# Patient Record
Sex: Female | Born: 1935 | Race: White | Hispanic: No | Smoking: Former smoker
Health system: Southern US, Community
[De-identification: ages and names within clinical notes are randomized; demographics above are authoritative.]

## PROBLEM LIST (undated history)

## (undated) DIAGNOSIS — K449 Diaphragmatic hernia without obstruction or gangrene: Secondary | ICD-10-CM

## (undated) DIAGNOSIS — I480 Paroxysmal atrial fibrillation: Secondary | ICD-10-CM

## (undated) DIAGNOSIS — E079 Disorder of thyroid, unspecified: Secondary | ICD-10-CM

## (undated) DIAGNOSIS — I441 Atrioventricular block, second degree: Secondary | ICD-10-CM

## (undated) DIAGNOSIS — F329 Major depressive disorder, single episode, unspecified: Secondary | ICD-10-CM

## (undated) DIAGNOSIS — E785 Hyperlipidemia, unspecified: Secondary | ICD-10-CM

## (undated) DIAGNOSIS — Z9049 Acquired absence of other specified parts of digestive tract: Secondary | ICD-10-CM

## (undated) DIAGNOSIS — K2970 Gastritis, unspecified, without bleeding: Secondary | ICD-10-CM

## (undated) DIAGNOSIS — M1990 Unspecified osteoarthritis, unspecified site: Secondary | ICD-10-CM

## (undated) DIAGNOSIS — E039 Hypothyroidism, unspecified: Secondary | ICD-10-CM

## (undated) DIAGNOSIS — Z9289 Personal history of other medical treatment: Secondary | ICD-10-CM

## (undated) DIAGNOSIS — N189 Chronic kidney disease, unspecified: Secondary | ICD-10-CM

## (undated) DIAGNOSIS — I2510 Atherosclerotic heart disease of native coronary artery without angina pectoris: Secondary | ICD-10-CM

## (undated) DIAGNOSIS — J209 Acute bronchitis, unspecified: Secondary | ICD-10-CM

## (undated) DIAGNOSIS — E876 Hypokalemia: Secondary | ICD-10-CM

## (undated) DIAGNOSIS — I10 Essential (primary) hypertension: Secondary | ICD-10-CM

## (undated) DIAGNOSIS — K8020 Calculus of gallbladder without cholecystitis without obstruction: Secondary | ICD-10-CM

## (undated) DIAGNOSIS — K219 Gastro-esophageal reflux disease without esophagitis: Secondary | ICD-10-CM

## (undated) DIAGNOSIS — F32A Depression, unspecified: Secondary | ICD-10-CM

## (undated) DIAGNOSIS — S72009A Fracture of unspecified part of neck of unspecified femur, initial encounter for closed fracture: Secondary | ICD-10-CM

## (undated) HISTORY — DX: Major depressive disorder, single episode, unspecified: F32.9

## (undated) HISTORY — DX: Hyperlipidemia, unspecified: E78.5

## (undated) HISTORY — DX: Hypokalemia: E87.6

## (undated) HISTORY — PX: BLEPHAROPLASTY: SUR158

## (undated) HISTORY — PX: CARDIAC CATHETERIZATION: SHX172

## (undated) HISTORY — DX: Depression, unspecified: F32.A

## (undated) HISTORY — DX: Personal history of other medical treatment: Z92.89

## (undated) HISTORY — DX: Atrioventricular block, second degree: I44.1

## (undated) HISTORY — DX: Diaphragmatic hernia without obstruction or gangrene: K44.9

## (undated) HISTORY — PX: CATARACT EXTRACTION: SUR2

## (undated) HISTORY — PX: ANGIOPLASTY: SHX39

## (undated) HISTORY — DX: Paroxysmal atrial fibrillation: I48.0

## (undated) HISTORY — DX: Disorder of thyroid, unspecified: E07.9

## (undated) HISTORY — DX: Acquired absence of other specified parts of digestive tract: Z90.49

## (undated) HISTORY — PX: DILATION AND CURETTAGE OF UTERUS: SHX78

## (undated) HISTORY — DX: Fracture of unspecified part of neck of unspecified femur, initial encounter for closed fracture: S72.009A

## (undated) HISTORY — PX: BACK SURGERY: SHX140

## (undated) HISTORY — PX: BREAST LUMPECTOMY: SHX2

## (undated) HISTORY — DX: Gastritis, unspecified, without bleeding: K29.70

## (undated) HISTORY — DX: Atherosclerotic heart disease of native coronary artery without angina pectoris: I25.10

## (undated) HISTORY — DX: Essential (primary) hypertension: I10

## (undated) HISTORY — DX: Unspecified osteoarthritis, unspecified site: M19.90

## (undated) HISTORY — PX: CHOLECYSTECTOMY: SHX55

## (undated) HISTORY — DX: Calculus of gallbladder without cholecystitis without obstruction: K80.20

## (undated) HISTORY — DX: Chronic kidney disease, unspecified: N18.9

---

## 2004-12-27 HISTORY — PX: CARDIAC CATHETERIZATION: SHX172

## 2004-12-27 HISTORY — PX: ANGIOPLASTY: SHX39

## 2015-04-04 ENCOUNTER — Ambulatory Visit (INDEPENDENT_AMBULATORY_CARE_PROVIDER_SITE_OTHER): Payer: Medicare PPO | Admitting: Cardiology

## 2015-04-04 ENCOUNTER — Encounter: Payer: Self-pay | Admitting: Cardiology

## 2015-04-04 VITALS — BP 118/62 | HR 59 | Ht 63.0 in | Wt 149.0 lb

## 2015-04-04 DIAGNOSIS — Z87891 Personal history of nicotine dependence: Secondary | ICD-10-CM | POA: Diagnosis not present

## 2015-04-04 DIAGNOSIS — I2583 Coronary atherosclerosis due to lipid rich plaque: Principal | ICD-10-CM

## 2015-04-04 DIAGNOSIS — I10 Essential (primary) hypertension: Secondary | ICD-10-CM | POA: Diagnosis not present

## 2015-04-04 DIAGNOSIS — I2510 Atherosclerotic heart disease of native coronary artery without angina pectoris: Secondary | ICD-10-CM | POA: Diagnosis not present

## 2015-04-04 DIAGNOSIS — Z7901 Long term (current) use of anticoagulants: Secondary | ICD-10-CM | POA: Insufficient documentation

## 2015-04-04 DIAGNOSIS — E785 Hyperlipidemia, unspecified: Secondary | ICD-10-CM | POA: Diagnosis not present

## 2015-04-04 NOTE — Patient Instructions (Signed)
Please stop your ASA. Continue all other medications as listed.  Follow up in 6 months with Dr. Anne Fu.  You will receive a letter in the mail 2 months before you are due.  Please call us when you receive this letter to schedule your follow up appointment.  Thank you for choosing [facility]!!

## 2015-04-04 NOTE — Progress Notes (Signed)
.     Cardiology Office Note   Date:  04/04/2015   ID:  Christina Ortiz, DOB 1929/02/27, MRN [id number]  PCP:  Astrid Divine, MD  Cardiologist:   Donato Schultz, MD       History of Present Illness: Christina Ortiz is a 81 y.o. female who presents for her to establish care with history of CAD, 2 previous stents RCA and LAD paroxysmal atrial fibrillation.  She is moved from [facility]. I reviewed medical records from [facility] hospital, [facility]. She has a history of percutaneous intervention in 2006. Chronic smoker. No prior stroke or valve-like symptoms. LDL 05/28/14 was 70.  Echocardiogram on 01/22/14 showed moderate LVH, EF 55-60%, diastolic dysfunction, mild aortic stenosis, no pulmonary hypertension. Aortic valve area was 2.06 cm.  Prior EKG on 11/11/11 showed poor R-wave progression, low voltage. She is also had intermittent Mobitz type I second-degree AV block.  She had nuclear stress test on 11/15/11 that showed no definite evidence of ischemia or scar. Regional wall motion was normal.  Chest x-ray on 03/13/12 showed stable findings, hiatal hernia noted.  For her paroxysmal atrial fibrillation, she has been taking Xarelto 15 mg once a day. Metoprolol extended release 25 mg once a day as well as aspirin was also noted are medication list.  Previously, she has had some challenges with control of her hypertension.   Creatinine 0.86, glucose 94, sodium 141, potassium 4.0, ALT 5  Biggest complaint is stiffness in AM hours. Mild fatigue. No CP. Mild dyspnea. Nasal congestion. No syncope. Gait instabily.   Christina Ortiz daughter I take care of.    Past Medical History  Diagnosis Date  . Hypertension   . Thyroid disease   . Hyperlipidemia   . Coronary artery disease   . A-fib   . Gastritis   . Arthritis   . Depression   . History of cholecystectomy     Past Surgical History  Procedure Laterality Date  . Dilation and curettage of uterus      . Breast lumpectomy Left   . Cardiac catheterization    . Cataract extraction Bilateral      Current Outpatient Prescriptions  Medication Sig Dispense Refill  . amLODipine (NORVASC) 10 MG tablet Take 10 mg by mouth daily.    Marland Kitchen aspirin 81 MG tablet Take 81 mg by mouth daily.    Marland Kitchen doxazosin (CARDURA) 8 MG tablet Take 8 mg by mouth daily.    Marland Kitchen levothyroxine (SYNTHROID, LEVOTHROID) 75 MCG tablet Take 75 mcg by mouth daily before breakfast.    . lisinopril (PRINIVIL,ZESTRIL) 40 MG tablet Take 40 mg by mouth daily.    . metoprolol succinate (TOPROL-XL) 25 MG 24 hr tablet Take 25 mg by mouth daily.    Marland Kitchen omeprazole (PRILOSEC) 20 MG capsule Take 20 mg by mouth daily.    Marland Kitchen PARoxetine (PAXIL) 40 MG tablet Take 40 mg by mouth every morning.    . potassium chloride (KLOR-CON) 8 MEQ tablet Take 8 mEq by mouth daily.    . Rivaroxaban (XARELTO) 15 MG TABS tablet Take 15 mg by mouth daily with supper.    . simvastatin (ZOCOR) 5 MG tablet Take 5 mg by mouth daily.    . quinapril (ACCUPRIL) 40 MG tablet Take 40 mg by mouth daily.     No current facility-administered medications for this visit.    Allergies:   Review of patient's allergies indicates no known allergies.    Social History:  The patient  reports  that she has quit smoking. She does not have any smokeless tobacco history on file.   Family History:  The patient's family history includes Heart disease in her mother; Lung cancer in her father. her mother had valvular heart disease age 14.   ROS:  Please see the history of present illness.   Otherwise, review of systems are positive for none.   All other systems are reviewed and negative.    PHYSICAL EXAM: VS:  BP 118/62 mmHg  Pulse 59  Ht 5\' 3"  (1.6 m)  Wt 149 lb (67.586 kg)  BMI 26.40 kg/m2 , BMI Body mass index is 26.4 kg/(m^2). GEN: Well nourished, well developed, in no acute distress HEENT: normal Neck: no JVD, carotid bruits, or masses Cardiac: RRR; 1/6 systolic right upper  sternal border murmur, no rubs, or gallops,no edema  Respiratory:  clear to auscultation bilaterally, normal work of breathing GI: soft, nontender, nondistended, + BS MS: no deformity or atrophy Skin: warm and dry, no rash, trace ankle edema at end of day Neuro:  Strength and sensation are intact Psych: euthymic mood, full affect   EKG:  EKG is ordered today. The ekg ordered today demonstrates 04/04/15-sinus bradycardia rate 59 with old septal infarct pattern, poor R-wave progression area no significant change from prior EKG.   Recent Labs: No results found for requested labs within last 365 days.    Lipid Panel No results found for: CHOL, TRIG, HDL, CHOLHDL, VLDL, LDLCALC, LDLDIRECT   LDL on 05/28/14 was 70, HDL 63, triglycerides 76    Wt Readings from Last 3 Encounters:  04/04/15 149 lb (67.586 kg)      Other studies Reviewed: Additional studies/ records that were reviewed today include: Prior office records from [place] reviewed, lab work reviewed, stress test reviewed.. Review of the above records demonstrates: As above   ASSESSMENT AND PLAN:  1. Paroxysmal atrial fibrillation-she is currently on Xarelto 15 mg once a day. This was dose adjusted because of decreased creatinine clearance at one point. We will continue. She has not had any bleeding issues. I will discontinue her aspirin 81 mg since she is taking Xarelto to help reduce overall bleeding risks for the future. She is asymptomatic with her atrial fibrillation. Continue with current medications.  2. Chronic anticoagulation-Xarelto 15 mg once a day. Every 6 months she should have a CBC and creatinine checked.  3. Coronary artery disease-prior LAD and RCA stent placed in 2006. Very stable, no anginal symptoms. Prior nuclear stress test reassuring with no ischemia.  4. Essential hypertension-very well controlled currently. ACE inhibitor noted. Metoprolol. Amlodipine may be contributing to some of the lower extremity  edema. I encouraged walking, compression hose, elevation of legs. She may have had increased lower extremity swelling with this medication.  5. Former smoking-she has quit in 2015.  6. Hyperlipidemia-low-dose simvastatin. LDL as listed above.    Current medicines are reviewed at length with the patient today.  The patient does not have concerns regarding medicines.  The following changes have been made:  Aspirin has been stopped.  Labs/ tests ordered today include:  No orders of the defined types were placed in this encounter.     Disposition:   FU with Skains in 6 months   Signed, Donato Schultz, MD  04/04/2015 12:09 PM    [facility] [address] [place], [place], [place]  [address] Phone: [phone]; Fax: [phone]

## 2015-07-01 NOTE — Addendum Note (Signed)
Addended by: Arcola Jansky on: 07/01/2015 09:39 AM   Modules accepted: Orders

## 2015-08-04 ENCOUNTER — Telehealth: Payer: Self-pay | Admitting: Cardiology

## 2015-08-04 NOTE — Telephone Encounter (Signed)
Will forward to Dr. Anne Fu and Audrie Lia, [facility] for review and advisement.

## 2015-08-04 NOTE — Telephone Encounter (Signed)
OK. Hold Xarelto 72 hours given age.  Donato Schultz, MD

## 2015-08-04 NOTE — Telephone Encounter (Signed)
Pt has a CHADS score of 2 and no history of CVA.  Okay to hold Xarelto 48-72 hours prior to procedure.  Will fax clearance to Dr. Juanda Chance office.

## 2015-08-04 NOTE — Telephone Encounter (Signed)
Request for surgical clearance:  1. What type of surgery is being performed? Back Injection   2. When is this surgery scheduled? Not scheduled until Clearance   Are there any medications that need to be held prior to surgery and how long? Xarelto for 2 days  3. Name of physician performing surgery? Dr. Regino Schultze with [facility].   4. What is your office phone and fax number? Office # [phone] Fax # [phone] 5. Comments: Surgical clearance was faxed on Aug 4th please call back to discuss.   Do not have to call the daughter back simply called Dr. Juanda Chance office to confirm that it is ok//sr

## 2015-08-05 ENCOUNTER — Telehealth: Payer: Self-pay | Admitting: Cardiology

## 2015-08-05 NOTE — Telephone Encounter (Signed)
See phone note from 08/04/15. Fax sent again.

## 2015-08-05 NOTE — Telephone Encounter (Signed)
Will route this message to Dr Anne Fu and nurse for further review of medication clearance from Xarelto for the pt to have an epidural injection done.  Follow-up with Dr Marily Memos office at [phone] (fax), attention Alyssa with [facility].

## 2015-08-05 NOTE — Telephone Encounter (Signed)
New message   Request for surgical clearance:  1. What type of surgery is being performed? Epidural injection   2. When is this surgery scheduled? Not scheduled at this time   3. Are there any medications that need to be held prior to surgery and how long?Xarelto 2 days   4. Name of physician performing surgery? Dr Claria Dice  5. What is your office phone and fax number? [facility] [phone]   Fax [phone]

## 2015-08-06 NOTE — Telephone Encounter (Signed)
Information faxed to number requested.

## 2015-09-12 ENCOUNTER — Encounter: Payer: Self-pay | Admitting: Cardiology

## 2015-10-03 ENCOUNTER — Ambulatory Visit: Payer: Medicare PPO | Admitting: Cardiology

## 2015-10-06 ENCOUNTER — Ambulatory Visit (INDEPENDENT_AMBULATORY_CARE_PROVIDER_SITE_OTHER): Payer: Medicare PPO | Admitting: Cardiology

## 2015-10-06 ENCOUNTER — Encounter: Payer: Self-pay | Admitting: Cardiology

## 2015-10-06 VITALS — BP 110/66 | HR 73 | Ht 63.5 in | Wt 147.2 lb

## 2015-10-06 DIAGNOSIS — Z87891 Personal history of nicotine dependence: Secondary | ICD-10-CM | POA: Diagnosis not present

## 2015-10-06 DIAGNOSIS — I2510 Atherosclerotic heart disease of native coronary artery without angina pectoris: Secondary | ICD-10-CM | POA: Diagnosis not present

## 2015-10-06 DIAGNOSIS — I10 Essential (primary) hypertension: Secondary | ICD-10-CM

## 2015-10-06 DIAGNOSIS — Z7901 Long term (current) use of anticoagulants: Secondary | ICD-10-CM

## 2015-10-06 DIAGNOSIS — I2583 Coronary atherosclerosis due to lipid rich plaque: Principal | ICD-10-CM

## 2015-10-06 DIAGNOSIS — E785 Hyperlipidemia, unspecified: Secondary | ICD-10-CM

## 2015-10-06 NOTE — Patient Instructions (Signed)
Medication Instructions:  Please stop your Amlodipine.  Continue all other medications as listed.  Follow-Up: Follow up in 3 months with Tereso Newcomer, PA  Thank you for choosing [facility]!!

## 2015-10-06 NOTE — Progress Notes (Signed)
.     Cardiology Office Note   Date:  10/06/2015   ID:  Jonte Wollam, DOB 04/26/1929, MRN [id number]  PCP:  Astrid Divine, MD  Cardiologist:   Donato Schultz, MD       History of Present Illness: Christina Ortiz is a 81 y.o. female who presents for her to establish care with history of CAD, 2 previous stents RCA and LAD paroxysmal atrial fibrillation.  She is moved from [facility]. I reviewed medical records from [facility] hospital, [facility]. She has a history of percutaneous intervention in 2006. Chronic smoker. No prior stroke or valve-like symptoms. LDL 05/28/14 was 70.  Echocardiogram on 01/22/14 showed moderate LVH, EF 55-60%, diastolic dysfunction, mild aortic stenosis, no pulmonary hypertension. Aortic valve area was 2.06 cm.  Prior EKG on 11/11/11 showed poor R-wave progression, low voltage. She is also had intermittent Mobitz type I second-degree AV block.  She had nuclear stress test on 11/15/11 that showed no definite evidence of ischemia or scar. Regional wall motion was normal.  Chest x-ray on 03/13/12 showed stable findings, hiatal hernia noted.  For her paroxysmal atrial fibrillation, she has been taking Xarelto 15 mg once a day. Metoprolol extended release 25 mg once a day as well as aspirin was also noted are medication list.  Previously, she has had some challenges with control of her hypertension.   Creatinine 0.86, glucose 94, sodium 141, potassium 4.0, ALT 5  Mild fatigue. No CP. Mild dyspnea. Nasal congestion. No syncope. Gait instabily. Please see below for details.  [name] daughter I take car of her.   Past Medical History  Diagnosis Date  . Hypertension   . Thyroid disease   . Hyperlipidemia   . Coronary artery disease   . A-fib (HCC)   . Gastritis   . Arthritis   . Depression   . History of cholecystectomy     Past Surgical History  Procedure Laterality Date  . Dilation and curettage of uterus    .  Breast lumpectomy Left   . Cardiac catheterization    . Cataract extraction Bilateral      Current Outpatient Prescriptions  Medication Sig Dispense Refill  . doxazosin (CARDURA) 8 MG tablet Take 8 mg by mouth daily.    Marland Kitchen levothyroxine (SYNTHROID, LEVOTHROID) 75 MCG tablet Take 75 mcg by mouth daily before breakfast.    . lisinopril (PRINIVIL,ZESTRIL) 40 MG tablet Take 40 mg by mouth daily.    . metoprolol succinate (TOPROL-XL) 25 MG 24 hr tablet Take 25 mg by mouth daily.    Marland Kitchen omeprazole (PRILOSEC) 20 MG capsule Take 20 mg by mouth daily.    Marland Kitchen PARoxetine (PAXIL) 40 MG tablet Take 40 mg by mouth every morning.    . potassium chloride (KLOR-CON) 8 MEQ tablet Take 8 mEq by mouth daily.    . quinapril (ACCUPRIL) 40 MG tablet Take 40 mg by mouth daily.    . Rivaroxaban (XARELTO) 15 MG TABS tablet Take 15 mg by mouth daily with supper.    . simvastatin (ZOCOR) 5 MG tablet Take 5 mg by mouth daily.     No current facility-administered medications for this visit.    Allergies:   Review of patient's allergies indicates no known allergies.    Social History:  The patient  reports that she has quit smoking. She does not have any smokeless tobacco history on file.   Family History:  The patient's family history includes Heart disease in her mother; Lung cancer  in her father. her mother had valvular heart disease age 74.   ROS:  Please see the history of present illness.   Otherwise, review of systems are positive for none.   All other systems are reviewed and negative.    PHYSICAL EXAM: VS:  BP 110/66 mmHg  Pulse 73  Ht 5' 3.5" (1.613 m)  Wt 147 lb 3.2 oz (66.769 kg)  BMI 25.66 kg/m2  SpO2 96% , BMI Body mass index is 25.66 kg/(m^2). GEN: Well nourished, well developed, in no acute distress HEENT: normal Neck: no JVD, carotid bruits, or masses Cardiac: RRR; 1/6 systolic right upper sternal border murmur, no rubs, or gallops,no edema  Respiratory:  clear to auscultation bilaterally,  normal work of breathing GI: soft, nontender, nondistended, + BS MS: no deformity or atrophy Skin: warm and dry, no rash, trace ankle edema at end of day Neuro:  Strength and sensation are intact Psych: euthymic mood, full affect   EKG:  EKG is ordered today. The ekg ordered today demonstrates 04/04/15-sinus bradycardia rate 59 with old septal infarct pattern, poor R-wave progression area no significant change from prior EKG.   Recent Labs: No results found for requested labs within last 365 days.    Lipid Panel No results found for: CHOL, TRIG, HDL, CHOLHDL, VLDL, LDLCALC, LDLDIRECT   LDL on 05/28/14 was 70, HDL 63, triglycerides 76    Wt Readings from Last 3 Encounters:  10/06/15 147 lb 3.2 oz (66.769 kg)  04/04/15 149 lb (67.586 kg)      Other studies Reviewed: Additional studies/ records that were reviewed today include: Prior office records from [place] reviewed, lab work reviewed, stress test reviewed.. Review of the above records demonstrates: As above   ASSESSMENT AND PLAN:  1. Paroxysmal atrial fibrillation-she is currently on Xarelto 15 mg once a day. This was dose adjusted because of decreased creatinine clearance at one point. We will continue. She has not had any bleeding issues. I have discontinued her aspirin 81 mg since she is taking Xarelto to help reduce overall bleeding risks for the future. She is asymptomatic with her atrial fibrillation. Continue with current medications. She did think that an urgent care doctor told her that she was in atrial fibrillation after a weekend drinking binge with vodka. Overall asymptomatic.  2. Chronic anticoagulation-Xarelto 15 mg once a day. Every 6 months she should have a CBC and creatinine checked.  3. Coronary artery disease-prior LAD and RCA stent placed in 2006. Very stable, no anginal symptoms. Prior nuclear stress test 2012 reassuring with no ischemia.   4. Essential hypertension-very well controlled currently, in fact  low. ACE inhibitor noted. Metoprolol. I'm going to stop her amlodipine (in the past she has complained about lower extremity edema).  5. Former smoking-she has quit in 2015.  6. Hyperlipidemia-low-dose simvastatin. LDL as listed above.    Current medicines are reviewed at length with the patient today.  The patient does not have concerns regarding medicines.  The following changes have been made:  Aspirin has been stopped.  Labs/ tests ordered today include:  No orders of the defined types were placed in this encounter.     Disposition:   We will have her come back in 3 months to see Tereso Newcomer, PA, check blood pressure.  Mathews Robinsons, MD  10/06/2015 10:18 AM    [facility] [address] [place], [place], [place]  [address] Phone: [phone]; Fax: [phone]

## 2015-10-08 ENCOUNTER — Telehealth: Payer: Self-pay | Admitting: Cardiology

## 2015-10-08 NOTE — Telephone Encounter (Signed)
New Message  Pt wanted to inform Dr Anne Fu-   Pt has stopped Amlodipine- Pt takes quinopril, and does NOT take lisinopril

## 2015-10-13 NOTE — Telephone Encounter (Signed)
Noted and medication list corrected. 

## 2015-11-04 ENCOUNTER — Telehealth: Payer: Self-pay | Admitting: Cardiology

## 2015-11-04 NOTE — Telephone Encounter (Signed)
New Message   Pt states that Dr.Skains requested that she stop her BP medication Anlodapin   And she her bp has gone up she wants to know what he wants her to do now

## 2015-11-04 NOTE — Telephone Encounter (Signed)
NA X multiple rings - no voicemail 10/10 - Amlodipine was d/c during office visit due to c/o edema.   What have BP and HR been?

## 2015-11-05 NOTE — Telephone Encounter (Signed)
NA X multiple rings - no voice mail

## 2015-11-06 MED ORDER — AMLODIPINE BESYLATE 10 MG PO TABS: 10.0000 mg | ORAL_TABLET | Freq: Every day | ORAL | Status: DC

## 2015-11-06 NOTE — Telephone Encounter (Signed)
F/u ° ° °Pt returning your call °

## 2015-11-06 NOTE — Telephone Encounter (Signed)
Spoke with pt and she states that since stopping the Amlodipine her BP has been running 170s/90s. HR 59-60. Pt denies CP, dizziness, lightheadedness or SOB. Pt states that she started taking Amlodipine 10mg  QD again about 3 days ago and her BP has been running about 125/79. Advised pt that I would route this information to Dr. Anne Fu for review and advisement. Pt verbalized understanding.

## 2015-11-06 NOTE — Telephone Encounter (Signed)
BP much improved with amlodipine Christina Chapel, MD

## 2015-11-06 NOTE — Telephone Encounter (Signed)
Attempted to call pt, phone rang several times with no answer and no VM option. Unable to leave message.  Will try again later.

## 2015-11-06 NOTE — Telephone Encounter (Signed)
Spoke with pt and informed her that Dr. Anne Fu said BP much improved with Amlodipine and to continue. Advised pt to continue monitoring her BP and let us know if it continues to stay high or drops too low. Pt verbalized understanding and was in agreement with this plan.

## 2015-11-08 ENCOUNTER — Emergency Department (HOSPITAL_COMMUNITY)
Admission: EM | Admit: 2015-11-08 | Discharge: 2015-11-08 | Disposition: A | Discharge status: Home / Self Care | Payer: Medicare PPO | Attending: Emergency Medicine | Admitting: Emergency Medicine

## 2015-11-08 ENCOUNTER — Encounter (HOSPITAL_COMMUNITY): Payer: Self-pay

## 2015-11-08 ENCOUNTER — Emergency Department (HOSPITAL_COMMUNITY): Payer: Medicare PPO

## 2015-11-08 DIAGNOSIS — S0081XA Abrasion of other part of head, initial encounter: Secondary | ICD-10-CM | POA: Diagnosis not present

## 2015-11-08 DIAGNOSIS — I10 Essential (primary) hypertension: Secondary | ICD-10-CM | POA: Insufficient documentation

## 2015-11-08 DIAGNOSIS — S42331A Displaced oblique fracture of shaft of humerus, right arm, initial encounter for closed fracture: Secondary | ICD-10-CM | POA: Insufficient documentation

## 2015-11-08 DIAGNOSIS — Z79899 Other long term (current) drug therapy: Secondary | ICD-10-CM | POA: Insufficient documentation

## 2015-11-08 DIAGNOSIS — E079 Disorder of thyroid, unspecified: Secondary | ICD-10-CM | POA: Diagnosis not present

## 2015-11-08 DIAGNOSIS — W010XXA Fall on same level from slipping, tripping and stumbling without subsequent striking against object, initial encounter: Secondary | ICD-10-CM | POA: Insufficient documentation

## 2015-11-08 DIAGNOSIS — S4991XA Unspecified injury of right shoulder and upper arm, initial encounter: Secondary | ICD-10-CM | POA: Diagnosis present

## 2015-11-08 DIAGNOSIS — S42301A Unspecified fracture of shaft of humerus, right arm, initial encounter for closed fracture: Secondary | ICD-10-CM

## 2015-11-08 DIAGNOSIS — Z9889 Other specified postprocedural states: Secondary | ICD-10-CM | POA: Insufficient documentation

## 2015-11-08 DIAGNOSIS — Z87891 Personal history of nicotine dependence: Secondary | ICD-10-CM | POA: Insufficient documentation

## 2015-11-08 DIAGNOSIS — F329 Major depressive disorder, single episode, unspecified: Secondary | ICD-10-CM | POA: Insufficient documentation

## 2015-11-08 DIAGNOSIS — I2510 Atherosclerotic heart disease of native coronary artery without angina pectoris: Secondary | ICD-10-CM | POA: Insufficient documentation

## 2015-11-08 DIAGNOSIS — S0083XA Contusion of other part of head, initial encounter: Secondary | ICD-10-CM | POA: Diagnosis not present

## 2015-11-08 DIAGNOSIS — E785 Hyperlipidemia, unspecified: Secondary | ICD-10-CM | POA: Diagnosis not present

## 2015-11-08 DIAGNOSIS — Y9389 Activity, other specified: Secondary | ICD-10-CM | POA: Insufficient documentation

## 2015-11-08 DIAGNOSIS — W19XXXA Unspecified fall, initial encounter: Secondary | ICD-10-CM

## 2015-11-08 DIAGNOSIS — K2970 Gastritis, unspecified, without bleeding: Secondary | ICD-10-CM | POA: Insufficient documentation

## 2015-11-08 DIAGNOSIS — Y998 Other external cause status: Secondary | ICD-10-CM | POA: Diagnosis not present

## 2015-11-08 DIAGNOSIS — Y9259 Other trade areas as the place of occurrence of the external cause: Secondary | ICD-10-CM | POA: Diagnosis not present

## 2015-11-08 MED ORDER — OXYCODONE-ACETAMINOPHEN 5-325 MG PO TABS: 2.0000 | ORAL_TABLET | ORAL | Status: DC | PRN

## 2015-11-08 MED ORDER — MORPHINE SULFATE (PF) 4 MG/ML IV SOLN
4.0000 mg | Freq: Once | INTRAVENOUS | Status: AC
  Administered 2015-11-08: 4 mg via INTRAVENOUS
  Filled 2015-11-08: qty 1

## 2015-11-08 NOTE — ED Notes (Signed)
Pt was transported to CT, then will go to X-ray

## 2015-11-08 NOTE — Discharge Instructions (Signed)
Cast or Splint Care Casts and splints support injured limbs and keep bones from moving while they heal. It is important to care for your cast or splint at home.  HOME CARE INSTRUCTIONS  Keep the cast or splint uncovered during the drying period. It can take 24 to 48 hours to dry if it is made of plaster. A fiberglass cast will dry in less than 1 hour.  Do not rest the cast on anything harder than a pillow for the first 24 hours.  Do not put weight on your injured limb or apply pressure to the cast until your health care provider gives you permission.  Keep the cast or splint dry. Wet casts or splints can lose their shape and may not support the limb as well. A wet cast that has lost its shape can also create harmful pressure on your skin when it dries. Also, wet skin can become infected.  Cover the cast or splint with a plastic bag when bathing or when out in the rain or snow. If the cast is on the trunk of the body, take sponge baths until the cast is removed.  If your cast does become wet, dry it with a towel or a blow dryer on the cool setting only.  Keep your cast or splint clean. Soiled casts may be wiped with a moistened cloth.  Do not place any hard or soft foreign objects under your cast or splint, such as cotton, toilet paper, lotion, or powder.  Do not try to scratch the skin under the cast with any object. The object could get stuck inside the cast. Also, scratching could lead to an infection. If itching is a problem, use a blow dryer on a cool setting to relieve discomfort.  Do not trim or cut your cast or remove padding from inside of it.  Exercise all joints next to the injury that are not immobilized by the cast or splint. For example, if you have a long leg cast, exercise the hip joint and toes. If you have an arm cast or splint, exercise the shoulder, elbow, thumb, and fingers.  Elevate your injured arm or leg on 1 or 2 pillows for the first 1 to 3 days to decrease  swelling and pain.It is best if you can comfortably elevate your cast so it is higher than your heart. SEEK MEDICAL CARE IF:   Your cast or splint cracks.  Your cast or splint is too tight or too loose.  You have unbearable itching inside the cast.  Your cast becomes wet or develops a soft spot or area.  You have a bad smell coming from inside your cast.  You get an object stuck under your cast.  Your skin around the cast becomes red or raw.  You have new pain or worsening pain after the cast has been applied. SEEK IMMEDIATE MEDICAL CARE IF:   You have fluid leaking through the cast.  You are unable to move your fingers or toes.  You have discolored (blue or white), cool, painful, or very swollen fingers or toes beyond the cast.  You have tingling or numbness around the injured area.  You have severe pain or pressure under the cast.  You have any difficulty with your breathing or have shortness of breath.  You have chest pain.   This information is not intended to replace advice given to you by your health care provider. Make sure you discuss any questions you have with your health care  provider.   Document Released: 12/10/2000 Document Revised: 10/03/2013 Document Reviewed: 06/21/2013 Elsevier Interactive Patient Education 2016 [employer]  Humerus Fracture Treated With Immobilization The humerus is the large bone in your upper arm. You have a broken (fractured) humerus. These fractures are easily diagnosed with X-rays. TREATMENT  Simple fractures which will heal without disability are treated with simple immobilization. Immobilization means you will wear a cast, splint, or sling. You have a fracture which will do well with immobilization. The fracture will heal well simply by being held in a good position until it is stable enough to begin range of motion exercises. Do not take part in activities which would further injure your arm.  HOME CARE INSTRUCTIONS   Put ice  on the injured area.  Put ice in a plastic bag.  Place a towel between your skin and the bag.  Leave the ice on for 15-20 minutes, 03-04 times a day.  If you have a cast:  Do not scratch the skin under the cast using sharp or pointed objects.  Check the skin around the cast every day. You may put lotion on any red or sore areas.  Keep your cast dry and clean.  If you have a splint:  Wear the splint as directed.  Keep your splint dry and clean.  You may loosen the elastic around the splint if your fingers become numb, tingle, or turn cold or blue.  If you have a sling:  Wear the sling as directed.  Do not put pressure on any part of your cast or splint until it is fully hardened.  Your cast or splint can be protected during bathing with a plastic bag. Do not lower the cast or splint into water.  Only take over-the-counter or prescription medicines for pain, discomfort, or fever as directed by your caregiver.  Do range of motion exercises as instructed by your caregiver.  Follow up as directed by your caregiver. This is very important in order to avoid permanent injury or disability and chronic pain. SEEK IMMEDIATE MEDICAL CARE IF:   Your skin or nails in the injured arm turn blue or gray.  Your arm feels cold or numb.  You develop severe pain in the injured arm.  You are having problems with the medicines you were given. MAKE SURE YOU:   Understand these instructions.  Will watch your condition.  Will get help right away if you are not doing well or get worse.   This information is not intended to replace advice given to you by your health care provider. Make sure you discuss any questions you have with your health care provider.   Follow-up with [facility] as soon as possible for reevaluation of humerus fracture. Keep splint clean and dry. Keep arm immobilized with splint until further instructed by orthopedic surgeon. Take Percocet as needed for  pain. Do not drink alcohol while taking Percocet. Return to the emergency department if you experience numbness or tingling in the extremity, discoloration of your hand or arm, increased pain.

## 2015-11-08 NOTE — ED Notes (Signed)
Bed: WA01 Expected date: 11/08/15 Expected time: 2:56 PM Means of arrival: Ambulance Comments: Fall, shoulder injury

## 2015-11-08 NOTE — ED Provider Notes (Signed)
CSN: [id number]     Arrival date & time 11/08/15  1508 History   First MD Initiated Contact with Patient 11/08/15 1522     Chief Complaint  Patient presents with  . Fall  . Arm Injury     (Consider location/radiation/quality/duration/timing/severity/associated sxs/prior Treatment) HPI  Christina Ortiz is an 81 y.o F with a pmhx of hTN, CAD, A. Fib on xarelto who presents the emergency department after a fall. Patient was in a grocery parking lot when she tripped over a curb and fell onto her right shoulder. Patient has abrasion to right forehead and bruising to her chin. However patient denies hitting her head or loss of consciousness. Patient states that a few bystanders who witnessed the fall called the EMS and that's who brought her here. EMS placed patient in a R arm sling. Patient complaining of pain in her right shoulder and right humerus. Patient states that her right arm "almost feels like it's numb but it's not". Given fentanyl by EMS PTA. Denies chest pain, shortness of breath, dizziness, lightheadedness, weakness, abdominal pain, headache, blurry vision.    Past Medical History  Diagnosis Date  . Hypertension   . Thyroid disease   . Hyperlipidemia   . Coronary artery disease   . A-fib (HCC)   . Gastritis   . Arthritis   . Depression   . History of cholecystectomy    Past Surgical History  Procedure Laterality Date  . Dilation and curettage of uterus    . Breast lumpectomy Left   . Cardiac catheterization    . Cataract extraction Bilateral    Family History  Problem Relation Age of Onset  . Heart disease Mother   . Lung cancer Father    Social History  Substance Use Topics  . Smoking status: Former Games developer  . Smokeless tobacco: Not on file     Comment: quit 2015  . Alcohol Use: Not on file   OB History    No data available     Review of Systems  All other systems reviewed and are negative.     Allergies  Review of patient's allergies indicates  no known allergies.  Home Medications   Prior to Admission medications   Medication Sig Start Date End Date Taking? Authorizing Provider  amLODipine (NORVASC) 10 MG tablet Take 1 tablet (10 mg total) by mouth daily. 11/06/15  Yes Jake Bathe, MD  doxazosin (CARDURA) 8 MG tablet Take 8 mg by mouth daily.   Yes Historical Provider, MD  levothyroxine (SYNTHROID, LEVOTHROID) 75 MCG tablet Take 75 mcg by mouth daily before breakfast.   Yes Historical Provider, MD  metoprolol succinate (TOPROL-XL) 25 MG 24 hr tablet Take 25 mg by mouth daily.   Yes Historical Provider, MD  omeprazole (PRILOSEC) 20 MG capsule Take 20 mg by mouth daily.   Yes Historical Provider, MD  PARoxetine (PAXIL) 40 MG tablet Take 40 mg by mouth Ortiz morning.   Yes Historical Provider, MD  potassium chloride (KLOR-CON) 8 MEQ tablet Take 8 mEq by mouth daily.   Yes Historical Provider, MD  quinapril (ACCUPRIL) 40 MG tablet Take 40 mg by mouth daily. 01/23/15  Yes Historical Provider, MD  Rivaroxaban (XARELTO) 15 MG TABS tablet Take 15 mg by mouth daily with supper.   Yes Historical Provider, MD  simvastatin (ZOCOR) 5 MG tablet Take 5 mg by mouth daily. 01/28/15  Yes Historical Provider, MD   BP 160/71 mmHg  Pulse 55  Temp(Src) 97.6 F (36.4  C) (Oral)  Resp 16  SpO2 93% Physical Exam  Constitutional: She is oriented to person, place, and time. She appears well-developed and well-nourished. No distress.  HENT:  Head: Normocephalic.  Mouth/Throat: Oropharynx is clear and moist. No oropharyngeal exudate.  Small superficial abrasion to R forehead. No active bleeding. NO edema, ecchymosis.  Ecchymosis present on chin. No obvious swelling or bony deformity. NTTP. Pt able to open and close jaw without difficulty.  Eyes: Conjunctivae and EOM are normal. Pupils are equal, round, and reactive to light. Right eye exhibits no discharge. Left eye exhibits no discharge. No scleral icterus.  Neck: Normal range of motion. Neck supple.    No midline cervical spinal tenderness   Cardiovascular: Normal rate, regular rhythm, normal heart sounds and intact distal pulses.  Exam reveals no gallop and no friction rub.   No murmur heard. Pulmonary/Chest: Effort normal and breath sounds normal. No respiratory distress. She has no wheezes. She has no rales. She exhibits no tenderness.  Abdominal: Soft. She exhibits no distension. There is no tenderness. There is no guarding.  Musculoskeletal: Normal range of motion. She exhibits no edema.       Right upper arm: She exhibits tenderness and deformity.       Arms: Pt not able to move RUE without pain. Obvious bony deformity of mid humerus. No open wound. R shoulder appears anteriorly displaced. Significant TTP to mid humerus and humeral head. No edema or ecchymosis.  Neurological: She is alert and oriented to person, place, and time.  Strength 5/5 throughout. No sensory deficits.    Skin: Skin is warm and dry. No rash noted. She is not diaphoretic. No erythema. No pallor.  Psychiatric: She has a normal mood and affect. Her behavior is normal.  Nursing note and vitals reviewed.   ED Course  Procedures (including critical care time) Labs Review Labs Reviewed  CBC WITH DIFFERENTIAL/PLATELET  BASIC METABOLIC PANEL    Imaging Review No results found. I have personally reviewed and evaluated these images and lab results as part of my medical decision-making.   EKG Interpretation None      MDM   Final diagnoses:  Humerus fracture, right, closed, initial encounter    81 y.o F on xarelto for a.fib presents for evaluation after mechanical fall. No LOC. Landed on R shoulder. Pt arm in sling placed by EMS. On exam R shoulder appears to be anterior to clavicle. Concern for dislocation vs fx. Pain in R elbow as well. Will obtain xray. Abrasion to R forehead. Pt on blood thinner. Will CT head. Pt A/O x 4.   IV placed by EMS. Will give additional pain medication.  Ct head negative  for acute abnormality.   Xray reveals displaced oblique fracture of the proximal humeral diaphysis with mild superior extension towards the humeral neck.  Spoke with Dr.Swinteck with orthopedics who instructed me to contact the on-call hand surgeon. Spoke with Dr. Melvyn Novas who recommends placing patient in a sling and having her follow-up in their office on Monday morning.  Patient placed in posterior splint and arm sling. Discussed treatment with patient and her family who are agreeable. Patient will go home with daughter to stay while she is in recovery. We will discharge home with pain medication. Patient instructed not to drink alcohol while taking this pain medication. Patient alert and oriented and expresses understanding. Patient stable for discharge.  Patient was discussed with and seen by Dr. Criss Alvine who agrees with the treatment plan.  Lester Kinsman [place], PA-C 11/08/15 1821  Pricilla Loveless, MD 11/11/15 [phone]

## 2015-11-08 NOTE — ED Notes (Signed)
Notified MD of head trauma.  Pt on xarelto.  Remains alert and oriented. Abrasion to rt frontal and chin.

## 2015-11-08 NOTE — ED Notes (Addendum)
Per EMS, pt was shopping and when she came out she tripped over curb.  Landed onto rt shoulder. Abrasion to rt forehead.  Laceration to left thumb.  Bruise to chin.  No LOC.  Pt on xarelto.  Pt is alert and oriented x 4.  Hx a-fib, htn.  Vitals: hr 65, 166/84, 94% ra  IV 20 g.  fentanyl in route.

## 2016-01-05 NOTE — Progress Notes (Signed)
Cardiology Office Note    Date:  01/06/2016   ID:  Christina Ortiz, DOB [birth year], MRN [id number]  PCP:  Astrid Divine, MD  Cardiologist:  Dr. Donato Schultz    Electrophysiologist:  n/a  Chief Complaint  Patient presents with  . Follow-up    Hypertension  . Coronary Artery Disease    History of Present Illness:  Christina Ortiz is a 82 y.o. female with a hx of CAD status post PCI/stenting to the RCA and LAD [facility] in [place]), PAF, CKD, tobacco abuse, mild aortic stenosis, intermittent Mobitz 1 AV block. She is on Xarelto 15 mg daily secondary to reduced creatinine clearance. Patient was last seen by Dr. Anne Fu 10/06/15. Blood pressure was running low. She complained of lower extremity edema. Amlodipine was stopped.  BP ran high off of Amlodipine.  She resumed this.  She has issues with her balance. She fell recently and fractured her R arm.  She denies dizziness or syncope. Denies chest pain, significant dyspnea, orthopnea, PND or significant edema.     Past Medical History  Diagnosis Date  . Hypertension   . Thyroid disease   . Hyperlipidemia   . Coronary artery disease     a. s/p PCI/stenting to RCA and LAD ([facility] in [place]) in 2006; b. Nuc 1/15: No ischemia, EF 70%  . PAF (paroxysmal atrial fibrillation) (HCC)   . Gastritis   . Arthritis   . Depression   . History of cholecystectomy   . CKD (chronic kidney disease)     Xarelto dose is 15 mg QD  . History of echocardiogram     a. Echo 1/15: Moderate LVH, EF 55-60%, impaired relaxation, mild AS  . Mobitz type 1 second degree atrioventricular block     a. Event Monitor 3/15: NSR, first-degree AV block, second-degree AB block - Mobitz 1, PACs, NSVT (5 beats)    Past Surgical History  Procedure Laterality Date  . Dilation and curettage of uterus    . Breast lumpectomy Left   . Cardiac catheterization    . Cataract extraction Bilateral     Current Outpatient Prescriptions    Medication Sig Dispense Refill  . amLODipine (NORVASC) 10 MG tablet Take 1 tablet (10 mg total) by mouth daily. 90 tablet 3  . doxazosin (CARDURA) 8 MG tablet Take 8 mg by mouth daily.    Marland Kitchen levothyroxine (SYNTHROID, LEVOTHROID) 75 MCG tablet Take 75 mcg by mouth daily before breakfast.    . metoprolol succinate (TOPROL-XL) 25 MG 24 hr tablet Take 0.5 tablets (12.5 mg total) by mouth daily. 30 tablet 11  . omeprazole (PRILOSEC) 20 MG capsule Take 20 mg by mouth daily.    Marland Kitchen PARoxetine (PAXIL) 40 MG tablet Take 40 mg by mouth every morning.    . potassium chloride (KLOR-CON) 8 MEQ tablet Take 8 mEq by mouth daily.    . quinapril (ACCUPRIL) 40 MG tablet Take 40 mg by mouth daily.    . Rivaroxaban (XARELTO) 15 MG TABS tablet Take 15 mg by mouth daily with supper.    . simvastatin (ZOCOR) 5 MG tablet Take 5 mg by mouth daily.     No current facility-administered medications for this visit.    Allergies:   Review of patient's allergies indicates no known allergies.   Social History   Social History  . Marital Status: Married    Spouse Name: N/A  . Number of Children: N/A  . Years of Education: N/A   Social  History Main Topics  . Smoking status: Former Games developer  . Smokeless tobacco: None     Comment: quit 2015  . Alcohol Use: None  . Drug Use: No  . Sexual Activity: Not Asked   Other Topics Concern  . None   Social History Narrative     Family History:  The patient's family history includes Heart disease in her mother; Lung cancer in her father.   ROS:   Please see the history of present illness.    Review of Systems  HENT: Positive for hearing loss.   Musculoskeletal: Positive for back pain.  Gastrointestinal: Positive for diarrhea.  Neurological: Positive for loss of balance.  All other systems reviewed and are negative.   PHYSICAL EXAM:   VS:  BP 112/60 mmHg  Pulse 56  Ht 5' 3.5" (1.613 m)  Wt 149 lb 9.6 oz (67.858 kg)  BMI 26.08 kg/m2   GEN: Well nourished, well  developed, in no acute distress HEENT: normal Neck: no JVD, no masses Cardiac: Normal S1/S2, RRR; no murmurs, rubs, or gallops, no edema   Respiratory:  clear to auscultation bilaterally; no wheezing, rhonchi or rales GI: soft, nontender, nondistended MS: no deformity or atrophy Skin:  no rash Neuro:  Bilateral strength equal, no focal deficits  Psych: Alert and oriented x 3, normal affect  Wt Readings from Last 3 Encounters:  01/06/16 149 lb 9.6 oz (67.858 kg)  10/06/15 147 lb 3.2 oz (66.769 kg)  04/04/15 149 lb (67.586 kg)      Studies/Labs Reviewed:   EKG:  EKG is ordered today.  The ekg ordered today demonstrates sinus bradycardia, HR 56, first-degree AV block (PR 246 ms), PACs, QTc 420 ms, no significant change when per prior tracings.  Recent Labs: No results found for requested labs within last 365 days.   Recent Lipid Panel No results found for: CHOL, TRIG, HDL, CHOLHDL, VLDL, LDLCALC, LDLDIRECT  Additional studies/ records that were reviewed today include:   Event monitor 3/15 NSR, first-degree AV block, second-degree AB block - Mobitz 1, PACs, NSVT (5 beats)  Myoview 1/15 No ischemia, EF 70%  Echo 1/15 Moderate LVH, EF 55-60%, impaired relaxation, mild AS   ASSESSMENT:    1. Coronary artery disease due to lipid rich plaque   2. PAF (paroxysmal atrial fibrillation) (HCC)   3. Essential hypertension   4. Hyperlipidemia   5. CKD (chronic kidney disease), unspecified stage   6. Bradycardia     PLAN:  In order of problems listed above:  1. CAD - Prior PCI to the RCA and LAD.  Myoview in 2015 was low risk.  No angina.  Continue current Rx.  She is not on ASA as she is on Xarelto.   2. PAF - She is on Xarelto 15 QD due to reduced Cr Cl.  She remains in NSR.  -  Request last BMET, CBC from PCP for our records.  -  Arrange FU BMET, CBC.  3. HTN - Amlodipine DC'd at last visit 2/2 edema and low BP but resumed for uncontrolled BP.  BP controlled.  Continue  current Rx.   4. HL - Continue statin.   5. CKD - Obtain FU BMET.  6. Bradycardia - She has a 1st degree AVB and hx of Mobitz 1 on Holter in the past.  She notes issues with balance.  Question if beta-blocker is contributing.   -  Decrease Toprol-XL to 12.5 mg QD.  May need to DC if bradycardia/imbalance continues.  Medication Adjustments/Labs and Tests Ordered: Current medicines are reviewed at length with the patient today.  Concerns regarding medicines are outlined above.  Medication changes, Labs and Tests ordered today are listed below. Patient Instructions  Medication Instructions:  1. DECREASE TOPROL XL TO 12.5 MG DAILY (THIS WILL BE 1/2 TAB OF THE 25 MG )  Labwork: 1. TODAY BMET, CBC W/DIFF  Testing/Procedures: NONE  Follow-Up: Your physician wants you to follow-up in: 6 MONTHS WITH DR. Anne Fu. You will receive a reminder letter in the mail two months in advance. If you don't receive a letter, please call our office to schedule the follow-up appointment.  Any Other Special Instructions Will Be Listed Below (If Applicable).  If you need a refill on your cardiac medications before your next appointment, please call your pharmacy.    Signed, Tereso Newcomer, PA-C  01/06/2016 11:03 AM    [facility] [address] [place], [place], [place]  [address] Phone: [phone]; Fax: [phone]

## 2016-01-06 ENCOUNTER — Ambulatory Visit (INDEPENDENT_AMBULATORY_CARE_PROVIDER_SITE_OTHER): Payer: Medicare PPO | Admitting: Physician Assistant

## 2016-01-06 ENCOUNTER — Encounter: Payer: Self-pay | Admitting: Physician Assistant

## 2016-01-06 VITALS — BP 112/60 | HR 56 | Ht 63.5 in | Wt 149.6 lb

## 2016-01-06 DIAGNOSIS — I2510 Atherosclerotic heart disease of native coronary artery without angina pectoris: Secondary | ICD-10-CM | POA: Diagnosis not present

## 2016-01-06 DIAGNOSIS — N189 Chronic kidney disease, unspecified: Secondary | ICD-10-CM

## 2016-01-06 DIAGNOSIS — I2583 Coronary atherosclerosis due to lipid rich plaque: Principal | ICD-10-CM

## 2016-01-06 DIAGNOSIS — I480 Paroxysmal atrial fibrillation: Secondary | ICD-10-CM | POA: Diagnosis not present

## 2016-01-06 DIAGNOSIS — R001 Bradycardia, unspecified: Secondary | ICD-10-CM

## 2016-01-06 DIAGNOSIS — I10 Essential (primary) hypertension: Secondary | ICD-10-CM

## 2016-01-06 DIAGNOSIS — E785 Hyperlipidemia, unspecified: Secondary | ICD-10-CM | POA: Diagnosis not present

## 2016-01-06 LAB — CBC WITH DIFFERENTIAL/PLATELET
BASOS ABS: 0 10*3/uL (ref 0.0–0.1)
BASOS PCT: 0 % (ref 0–1)
EOS ABS: 0.2 10*3/uL (ref 0.0–0.7)
Eosinophils Relative: 3 % (ref 0–5)
HCT: 37.9 % (ref 36.0–46.0)
Hemoglobin: 12.5 g/dL (ref 12.0–15.0)
LYMPHS ABS: 1.7 10*3/uL (ref 0.7–4.0)
LYMPHS PCT: 26 % (ref 12–46)
MCH: 31.1 pg (ref 26.0–34.0)
MCHC: 33 g/dL (ref 30.0–36.0)
MCV: 94.3 fL (ref 78.0–100.0)
MPV: 10.6 fL (ref 8.6–12.4)
Monocytes Absolute: 0.9 10*3/uL (ref 0.1–1.0)
Monocytes Relative: 13 % — ABNORMAL HIGH (ref 3–12)
NEUTROS ABS: 3.8 10*3/uL (ref 1.7–7.7)
NEUTROS PCT: 58 % (ref 43–77)
PLATELETS: 195 10*3/uL (ref 150–400)
RBC: 4.02 MIL/uL (ref 3.87–5.11)
RDW: 13.1 % (ref 11.5–15.5)
WBC: 6.6 10*3/uL (ref 4.0–10.5)

## 2016-01-06 LAB — BASIC METABOLIC PANEL
BUN: 16 mg/dL (ref 7–25)
CHLORIDE: 104 mmol/L (ref 98–110)
CO2: 26 mmol/L (ref 20–31)
Calcium: 9.4 mg/dL (ref 8.6–10.4)
Creat: 0.73 mg/dL (ref 0.60–0.88)
Glucose, Bld: 87 mg/dL (ref 65–99)
POTASSIUM: 3.6 mmol/L (ref 3.5–5.3)
SODIUM: 142 mmol/L (ref 135–146)

## 2016-01-06 MED ORDER — METOPROLOL SUCCINATE ER 25 MG PO TB24: 12.5000 mg | ORAL_TABLET | Freq: Every day | ORAL | Status: DC

## 2016-01-06 NOTE — Patient Instructions (Addendum)
Medication Instructions:  1. DECREASE TOPROL XL TO 12.5 MG DAILY (THIS WILL BE 1/2 TAB OF THE 25 MG )  Labwork: 1. TODAY BMET, CBC W/DIFF  Testing/Procedures: NONE  Follow-Up: Your physician wants you to follow-up in: 6 MONTHS WITH DR. Anne Fu. You will receive a reminder letter in the mail two months in advance. If you don't receive a letter, please call our office to schedule the follow-up appointment.  Any Other Special Instructions Will Be Listed Below (If Applicable).  If you need a refill on your cardiac medications before your next appointment, please call your pharmacy.

## 2016-01-07 ENCOUNTER — Telehealth: Payer: Self-pay | Admitting: *Deleted

## 2016-01-07 NOTE — Telephone Encounter (Signed)
DPR on file for daughter Christina Ortiz, who has been notified of lab results by phne with verbal understanding. I advised Christina Ortiz though that [employer]. PA is going to review w/Dr. Anne Fu of possibly d/c Xarelto and start Eliquis, daughter said ok and thank you. Advised at this point to continue on current regimen.

## 2016-01-09 ENCOUNTER — Telehealth: Payer: Self-pay | Admitting: *Deleted

## 2016-01-09 DIAGNOSIS — I10 Essential (primary) hypertension: Secondary | ICD-10-CM

## 2016-01-09 DIAGNOSIS — Z7901 Long term (current) use of anticoagulants: Secondary | ICD-10-CM

## 2016-01-09 MED ORDER — RIVAROXABAN 20 MG PO TABS: 20.0000 mg | ORAL_TABLET | Freq: Every day | ORAL | Status: DC

## 2016-01-09 NOTE — Telephone Encounter (Signed)
DPR on file for daughter Christina Ortiz. Advised Carla per Bing Neighbors. PA and Dr. Anne Fu to have pt increase Xarelto to 20 mg daily, new Rx sent in. BMET, CBC 04/09/16.

## 2016-04-07 LAB — HM DEXA SCAN

## 2016-04-09 ENCOUNTER — Other Ambulatory Visit (INDEPENDENT_AMBULATORY_CARE_PROVIDER_SITE_OTHER): Payer: Medicare PPO | Admitting: *Deleted

## 2016-04-09 DIAGNOSIS — I10 Essential (primary) hypertension: Secondary | ICD-10-CM

## 2016-04-09 DIAGNOSIS — Z7901 Long term (current) use of anticoagulants: Secondary | ICD-10-CM | POA: Diagnosis not present

## 2016-04-09 LAB — CBC WITH DIFFERENTIAL/PLATELET
Basophils Absolute: 0 cells/uL (ref 0–200)
Basophils Relative: 0 %
Eosinophils Absolute: 284 cells/uL (ref 15–500)
Eosinophils Relative: 4 %
HCT: 36.9 % (ref 35.0–45.0)
Hemoglobin: 12.3 g/dL (ref 11.7–15.5)
Lymphocytes Relative: 22 %
Lymphs Abs: 1562 cells/uL (ref 850–3900)
MCH: 31.1 pg (ref 27.0–33.0)
MCHC: 33.3 g/dL (ref 32.0–36.0)
MCV: 93.4 fL (ref 80.0–100.0)
MPV: 10.6 fL (ref 7.5–12.5)
Monocytes Absolute: 781 cells/uL (ref 200–950)
Monocytes Relative: 11 %
Neutro Abs: 4473 cells/uL (ref 1500–7800)
Neutrophils Relative %: 63 %
Platelets: 195 10*3/uL (ref 140–400)
RBC: 3.95 MIL/uL (ref 3.80–5.10)
RDW: 13.8 % (ref 11.0–15.0)
WBC: 7.1 10*3/uL (ref 3.8–10.8)

## 2016-04-09 LAB — BASIC METABOLIC PANEL
BUN: 15 mg/dL (ref 7–25)
CO2: 28 mmol/L (ref 20–31)
Calcium: 9.1 mg/dL (ref 8.6–10.4)
Chloride: 105 mmol/L (ref 98–110)
Creat: 0.75 mg/dL (ref 0.60–0.88)
Glucose, Bld: 79 mg/dL (ref 65–99)
Potassium: 3.8 mmol/L (ref 3.5–5.3)
Sodium: 142 mmol/L (ref 135–146)

## 2016-04-09 NOTE — Addendum Note (Signed)
Addended by: Tonita Phoenix on: 04/09/2016 10:24 AM   Modules accepted: Orders

## 2016-04-13 ENCOUNTER — Telehealth: Payer: Self-pay | Admitting: *Deleted

## 2016-04-13 NOTE — Telephone Encounter (Signed)
Lmom lab results ok. No changes with medication and to continue on current dose of Xarelto. Any questions cb [phone].

## 2016-07-05 LAB — HM MAMMOGRAPHY

## 2016-09-06 LAB — HEPATIC FUNCTION PANEL
ALK PHOS: 62 U/L (ref 25–125)
ALT: 5 U/L — AB (ref 7–35)
AST: 19 U/L (ref 13–35)
Bilirubin, Total: 0.7 mg/dL

## 2016-09-06 LAB — BASIC METABOLIC PANEL
BUN: 20 mg/dL (ref 4–21)
CREATININE: 0.8 mg/dL (ref <=1.1)
GLUCOSE: 87 mg/dL
POTASSIUM: 4 mmol/L (ref 3.4–5.3)
Sodium: 146 mmol/L (ref 137–147)

## 2016-09-06 LAB — LIPID PANEL
CHOLESTEROL: 147 mg/dL (ref 0–200)
HDL: 59 mg/dL (ref 35–70)
LDL Cholesterol: 69 mg/dL
Triglycerides: 95 mg/dL (ref 40–160)

## 2016-09-06 LAB — TSH: TSH: 0.34 u[IU]/mL — AB (ref <=5.90)

## 2017-01-07 LAB — CBC AND DIFFERENTIAL
HEMATOCRIT: 43 % (ref 36–46)
Hemoglobin: 14.3 g/dL (ref 12.0–16.0)
Platelets: 206 10*3/uL (ref 150–399)
WBC: 9.2 10^3/mL

## 2017-01-13 ENCOUNTER — Other Ambulatory Visit: Payer: Self-pay | Admitting: Physician Assistant

## 2017-01-13 DIAGNOSIS — Z7901 Long term (current) use of anticoagulants: Secondary | ICD-10-CM

## 2017-01-22 ENCOUNTER — Observation Stay (HOSPITAL_COMMUNITY)
Admission: EM | Admit: 2017-01-22 | Discharge: 2017-01-25 | Disposition: A | Discharge status: Home / Self Care | Payer: Medicare PPO | Attending: Internal Medicine | Admitting: Internal Medicine

## 2017-01-22 ENCOUNTER — Emergency Department (HOSPITAL_COMMUNITY): Payer: Medicare PPO

## 2017-01-22 ENCOUNTER — Other Ambulatory Visit: Payer: Self-pay

## 2017-01-22 ENCOUNTER — Encounter (HOSPITAL_COMMUNITY): Payer: Self-pay

## 2017-01-22 DIAGNOSIS — E039 Hypothyroidism, unspecified: Secondary | ICD-10-CM | POA: Insufficient documentation

## 2017-01-22 DIAGNOSIS — R531 Weakness: Principal | ICD-10-CM

## 2017-01-22 DIAGNOSIS — N189 Chronic kidney disease, unspecified: Secondary | ICD-10-CM | POA: Insufficient documentation

## 2017-01-22 DIAGNOSIS — J101 Influenza due to other identified influenza virus with other respiratory manifestations: Secondary | ICD-10-CM | POA: Insufficient documentation

## 2017-01-22 DIAGNOSIS — E876 Hypokalemia: Secondary | ICD-10-CM | POA: Insufficient documentation

## 2017-01-22 DIAGNOSIS — I480 Paroxysmal atrial fibrillation: Secondary | ICD-10-CM | POA: Insufficient documentation

## 2017-01-22 DIAGNOSIS — Z87891 Personal history of nicotine dependence: Secondary | ICD-10-CM

## 2017-01-22 DIAGNOSIS — R0602 Shortness of breath: Secondary | ICD-10-CM

## 2017-01-22 DIAGNOSIS — Z7901 Long term (current) use of anticoagulants: Secondary | ICD-10-CM | POA: Insufficient documentation

## 2017-01-22 DIAGNOSIS — E785 Hyperlipidemia, unspecified: Secondary | ICD-10-CM

## 2017-01-22 DIAGNOSIS — F329 Major depressive disorder, single episode, unspecified: Secondary | ICD-10-CM | POA: Insufficient documentation

## 2017-01-22 DIAGNOSIS — I2510 Atherosclerotic heart disease of native coronary artery without angina pectoris: Secondary | ICD-10-CM

## 2017-01-22 DIAGNOSIS — E860 Dehydration: Secondary | ICD-10-CM | POA: Diagnosis not present

## 2017-01-22 DIAGNOSIS — J111 Influenza due to unidentified influenza virus with other respiratory manifestations: Secondary | ICD-10-CM | POA: Insufficient documentation

## 2017-01-22 DIAGNOSIS — R112 Nausea with vomiting, unspecified: Secondary | ICD-10-CM | POA: Insufficient documentation

## 2017-01-22 DIAGNOSIS — I2583 Coronary atherosclerosis due to lipid rich plaque: Secondary | ICD-10-CM

## 2017-01-22 DIAGNOSIS — I129 Hypertensive chronic kidney disease with stage 1 through stage 4 chronic kidney disease, or unspecified chronic kidney disease: Secondary | ICD-10-CM | POA: Diagnosis not present

## 2017-01-22 DIAGNOSIS — I10 Essential (primary) hypertension: Secondary | ICD-10-CM | POA: Diagnosis present

## 2017-01-22 DIAGNOSIS — Z79899 Other long term (current) drug therapy: Secondary | ICD-10-CM | POA: Insufficient documentation

## 2017-01-22 DIAGNOSIS — Z955 Presence of coronary angioplasty implant and graft: Secondary | ICD-10-CM | POA: Diagnosis not present

## 2017-01-22 LAB — TSH: TSH: 1.276 u[IU]/mL (ref 0.350–4.500)

## 2017-01-22 LAB — CBG MONITORING, ED: GLUCOSE-CAPILLARY: 106 mg/dL — AB (ref 65–99)

## 2017-01-22 LAB — I-STAT CHEM 8, ED
BUN: 19 mg/dL (ref 6–20)
Calcium, Ion: 1.12 mmol/L — ABNORMAL LOW (ref 1.15–1.40)
Chloride: 96 mmol/L — ABNORMAL LOW (ref 101–111)
Creatinine, Ser: 0.9 mg/dL (ref 0.44–1.00)
Glucose, Bld: 103 mg/dL — ABNORMAL HIGH (ref 65–99)
HCT: 43 % (ref 36.0–46.0)
Hemoglobin: 14.6 g/dL (ref 12.0–15.0)
Potassium: 3.3 mmol/L — ABNORMAL LOW (ref 3.5–5.1)
SODIUM: 139 mmol/L (ref 135–145)
TCO2: 30 mmol/L (ref 0–100)

## 2017-01-22 LAB — URINALYSIS, ROUTINE W REFLEX MICROSCOPIC
BILIRUBIN URINE: NEGATIVE
Glucose, UA: NEGATIVE mg/dL
Hgb urine dipstick: NEGATIVE
Ketones, ur: 20 mg/dL — AB
NITRITE: NEGATIVE
PH: 6 (ref 5.0–8.0)
Protein, ur: 30 mg/dL — AB
Specific Gravity, Urine: 1.012 (ref 1.005–1.030)

## 2017-01-22 LAB — BASIC METABOLIC PANEL
ANION GAP: 15 (ref 5–15)
BUN: 14 mg/dL (ref 6–20)
CO2: 25 mmol/L (ref 22–32)
CREATININE: 0.95 mg/dL (ref 0.44–1.00)
Calcium: 9.4 mg/dL (ref 8.9–10.3)
Chloride: 97 mmol/L — ABNORMAL LOW (ref 101–111)
GFR, EST NON AFRICAN AMERICAN: 53 mL/min — AB (ref >60)
GLUCOSE: 105 mg/dL — AB (ref 65–99)
Potassium: 3.3 mmol/L — ABNORMAL LOW (ref 3.5–5.1)
Sodium: 137 mmol/L (ref 135–145)

## 2017-01-22 LAB — CBC
HCT: 40.6 % (ref 36.0–46.0)
Hemoglobin: 13.9 g/dL (ref 12.0–15.0)
MCH: 32.8 pg (ref 26.0–34.0)
MCHC: 34.2 g/dL (ref 30.0–36.0)
MCV: 95.8 fL (ref 78.0–100.0)
PLATELETS: 257 10*3/uL (ref 150–400)
RBC: 4.24 MIL/uL (ref 3.87–5.11)
RDW: 13 % (ref 11.5–15.5)
WBC: 8.1 10*3/uL (ref 4.0–10.5)

## 2017-01-22 LAB — INFLUENZA PANEL BY PCR (TYPE A & B)
INFLAPCR: POSITIVE — AB
INFLBPCR: NEGATIVE

## 2017-01-22 LAB — I-STAT TROPONIN, ED: TROPONIN I, POC: 0.01 ng/mL (ref 0.00–0.08)

## 2017-01-22 MED ORDER — RIVAROXABAN 20 MG PO TABS
20.0000 mg | ORAL_TABLET | Freq: Every day | ORAL | Status: DC
  Administered 2017-01-23 – 2017-01-24 (×2): 20 mg via ORAL
  Filled 2017-01-22 (×2): qty 1

## 2017-01-22 MED ORDER — PANTOPRAZOLE SODIUM 40 MG PO TBEC
40.0000 mg | DELAYED_RELEASE_TABLET | Freq: Every day | ORAL | Status: DC
Start: 2017-01-23 — End: 2017-01-25
  Administered 2017-01-23 – 2017-01-25 (×3): 40 mg via ORAL
  Filled 2017-01-22 (×3): qty 1

## 2017-01-22 MED ORDER — BISACODYL 10 MG RE SUPP: 10.0000 mg | Freq: Every day | RECTAL | Status: DC | PRN

## 2017-01-22 MED ORDER — POTASSIUM CHLORIDE ER 8 MEQ PO TBCR
8.0000 meq | EXTENDED_RELEASE_TABLET | Freq: Every day | ORAL | Status: DC
  Administered 2017-01-23 – 2017-01-25 (×3): 8 meq via ORAL
  Filled 2017-01-22 (×4): qty 1

## 2017-01-22 MED ORDER — ACETAMINOPHEN 650 MG RE SUPP: 650.0000 mg | Freq: Four times a day (QID) | RECTAL | Status: DC | PRN

## 2017-01-22 MED ORDER — DOXAZOSIN MESYLATE 8 MG PO TABS
8.0000 mg | ORAL_TABLET | Freq: Every day | ORAL | Status: DC
  Administered 2017-01-23 – 2017-01-25 (×3): 8 mg via ORAL
  Filled 2017-01-22 (×3): qty 1

## 2017-01-22 MED ORDER — SODIUM CHLORIDE 0.9 % IV SOLN
INTRAVENOUS | Status: DC
  Administered 2017-01-22: 16:00:00 via INTRAVENOUS

## 2017-01-22 MED ORDER — SENNOSIDES-DOCUSATE SODIUM 8.6-50 MG PO TABS
1.0000 | ORAL_TABLET | Freq: Every evening | ORAL | Status: DC | PRN
  Filled 2017-01-22: qty 1

## 2017-01-22 MED ORDER — HYDROCODONE-ACETAMINOPHEN 5-325 MG PO TABS: 1.0000 | ORAL_TABLET | ORAL | Status: DC | PRN

## 2017-01-22 MED ORDER — METOPROLOL SUCCINATE 12.5 MG HALF TABLET
12.5000 mg | ORAL_TABLET | Freq: Every day | ORAL | Status: DC
  Administered 2017-01-23 – 2017-01-25 (×3): 12.5 mg via ORAL
  Filled 2017-01-22 (×3): qty 1

## 2017-01-22 MED ORDER — ONDANSETRON HCL 4 MG PO TABS: 4.0000 mg | ORAL_TABLET | Freq: Four times a day (QID) | ORAL | Status: DC | PRN

## 2017-01-22 MED ORDER — SIMVASTATIN 5 MG PO TABS
5.0000 mg | ORAL_TABLET | Freq: Every day | ORAL | Status: DC
  Administered 2017-01-23 – 2017-01-24 (×2): 5 mg via ORAL
  Filled 2017-01-22 (×4): qty 1

## 2017-01-22 MED ORDER — MAGNESIUM CITRATE PO SOLN: 1.0000 | Freq: Once | ORAL | Status: DC | PRN

## 2017-01-22 MED ORDER — PAROXETINE HCL 20 MG PO TABS
40.0000 mg | ORAL_TABLET | Freq: Every day | ORAL | Status: DC
  Administered 2017-01-23 – 2017-01-25 (×3): 40 mg via ORAL
  Filled 2017-01-22 (×3): qty 2

## 2017-01-22 MED ORDER — QUINAPRIL HCL 10 MG PO TABS
40.0000 mg | ORAL_TABLET | Freq: Every day | ORAL | Status: DC
  Administered 2017-01-23 – 2017-01-25 (×3): 40 mg via ORAL
  Filled 2017-01-22 (×4): qty 4

## 2017-01-22 MED ORDER — ACETAMINOPHEN 325 MG PO TABS: 650.0000 mg | ORAL_TABLET | Freq: Four times a day (QID) | ORAL | Status: DC | PRN

## 2017-01-22 MED ORDER — ONDANSETRON HCL 4 MG/2ML IJ SOLN: 4.0000 mg | Freq: Four times a day (QID) | INTRAMUSCULAR | Status: DC | PRN

## 2017-01-22 MED ORDER — AMLODIPINE BESYLATE 5 MG PO TABS
10.0000 mg | ORAL_TABLET | Freq: Every day | ORAL | Status: DC
  Administered 2017-01-23 – 2017-01-25 (×3): 10 mg via ORAL
  Filled 2017-01-22: qty 4
  Filled 2017-01-22 (×3): qty 2

## 2017-01-22 MED ORDER — TRAZODONE HCL 50 MG PO TABS: 25.0000 mg | ORAL_TABLET | Freq: Every evening | ORAL | Status: DC | PRN

## 2017-01-22 MED ORDER — LEVOTHYROXINE SODIUM 75 MCG PO TABS
75.0000 ug | ORAL_TABLET | Freq: Every day | ORAL | Status: DC
  Administered 2017-01-23 – 2017-01-25 (×3): 75 ug via ORAL
  Filled 2017-01-22 (×3): qty 1

## 2017-01-22 NOTE — H&P (Signed)
History and Physical    Christina Ortiz UVQ:[id number] DOB: 06-14-1929 DOA: 01/22/2017   PCP: Astrid Divine, MD   Patient coming from:  Home   Chief Complaint: generalized weakness   HPI: Christina Ortiz is a 83 y.o. female with medical history significant for atrial fibrillation on Xarelto, hypertension, hyperlipidemia, a history of CAD, hypothyroidism, CK D, depression, presenting with progressive, 2 week history of increasing generalized weakness, worse on exertion. At the time of presentation, she is very weak with minimal activity. She reports minimal shortness of breath on exertion as well. No cough.  At this time, she denies any chest pain, or diaphoresis. She has intermittent nausea and vomiting. She is recovering from the diarrheal illness one week prior to this admission. She has been tolerating fluid and food intake by mouth, but less than normal. She denies any dysuria, gross hematuria.  She denies any dizziness, syncope or presyncope. She denies any falls. No seizures were noted. No confusion was reported. She denies any fever or chills. No myalgias. She reports some cold intolerance. Of note, she is caring for her husband, who is currently hospitalized  for possible pneumonia versus other upper respiratory infection.  She admits to the possibility of having been exposed to vitals versus bacterial illnesses  She states that the last time she felt this way, is when she had a stent placed, about 10 years ago.   ED Course:  BP 155/84   Pulse 86   Temp 98.7 F (37.1 C) (Oral)   Resp 21   Ht 5\' 3"  (1.6 m)   Wt 68.9 kg (152 lb)   SpO2 96%   BMI 26.93 kg/m    EKG with atrial fibrillation, without ACS. Troponin 0.01. Sodium 139 potassium 3.3   creatinine 0.9 hemoglobin 14.6 hematocrit 43 .  WBC 8.1, platelets to 57. TSH pending.  glucose 103 urinalysis is hazy, with a trace of leukocytes, negative nitrite. Chest x-ray and NAD .  Review of Systems: As per HPI otherwise  10 point review of systems negative.   Past Medical History:  Diagnosis Date  . Arthritis   . CKD (chronic kidney disease)    Xarelto dose is 15 mg QD  . Coronary artery disease    a. s/p PCI/stenting to RCA and LAD ([facility] in [place]) in 2006; b. Nuc 1/15: No ischemia, EF 70%  . Depression   . Gastritis   . History of cholecystectomy   . History of echocardiogram    a. Echo 1/15: Moderate LVH, EF 55-60%, impaired relaxation, mild AS  . Hyperlipidemia   . Hypertension   . Mobitz type 1 second degree atrioventricular block    a. Event Monitor 3/15: NSR, first-degree AV block, second-degree AB block - Mobitz 1, PACs, NSVT (5 beats)  . PAF (paroxysmal atrial fibrillation) (HCC)   . Thyroid disease     Past Surgical History:  Procedure Laterality Date  . BREAST LUMPECTOMY Left   . CARDIAC CATHETERIZATION    . CATARACT EXTRACTION Bilateral   . DILATION AND CURETTAGE OF UTERUS      Social History Social History   Social History  . Marital status: Married    Spouse name: N/A  . Number of children: N/A  . Years of education: N/A   Occupational History  . Not on file.   Social History Main Topics  . Smoking status: Former Games developer  . Smokeless tobacco: Not on file     Comment: quit 2015  . Alcohol use  Not on file  . Drug use: No  . Sexual activity: Not on file   Other Topics Concern  . Not on file   Social History Narrative  . No narrative on file     No Known Allergies  Family History  Problem Relation Age of Onset  . Heart disease Mother   . Lung cancer Father       Prior to Admission medications   Medication Sig Start Date End Date Taking? Authorizing Provider  amLODipine (NORVASC) 10 MG tablet Take 1 tablet (10 mg total) by mouth daily. 11/06/15   Jake Bathe, MD  doxazosin (CARDURA) 8 MG tablet Take 8 mg by mouth daily.    Historical Provider, MD  levothyroxine (SYNTHROID, LEVOTHROID) 75 MCG tablet Take 75 mcg by mouth daily before breakfast.     Historical Provider, MD  metoprolol succinate (TOPROL-XL) 25 MG 24 hr tablet Take 0.5 tablets (12.5 mg total) by mouth daily. 01/06/16   Beatrice Lecher, PA-C  omeprazole (PRILOSEC) 20 MG capsule Take 20 mg by mouth daily.    Historical Provider, MD  PARoxetine (PAXIL) 40 MG tablet Take 40 mg by mouth every morning.    Historical Provider, MD  potassium chloride (KLOR-CON) 8 MEQ tablet Take 8 mEq by mouth daily.    Historical Provider, MD  quinapril (ACCUPRIL) 40 MG tablet Take 40 mg by mouth daily. 01/23/15   Historical Provider, MD  simvastatin (ZOCOR) 5 MG tablet Take 5 mg by mouth daily. 01/28/15   Historical Provider, MD  XARELTO 20 MG TABS tablet TAKE 1 TABLET (20 MG TOTAL) BY MOUTH DAILY WITH SUPPER. 01/14/17   Beatrice Lecher, PA-C    Physical Exam:  Vitals:   01/22/17 1319 01/22/17 1321 01/22/17 1330 01/22/17 1345  BP:  138/84 125/57 155/84  Pulse: 85 84 85 86  Resp: 21 20 22 21   Temp:      TempSrc:      SpO2:  93% 96% 96%  Weight:  68.9 kg (152 lb)    Height:  5\' 3"  (1.6 m)     Constitutional: NAD, calm, comfortable   Eyes: PERRL, lids and conjunctivae normal ENMT: Mucous membranes are moist, without exudate or lesions  Neck: normal, supple, no masses, no thyromegaly Respiratory: clear to auscultation bilaterally, no wheezing, no crackles. Normal respiratory effort  Cardiovascular: Irregularly irregular  rate and rhythm, no murmurs / rubs / gallops. No extremity edema. 2+ pedal pulses. No carotid bruits.  Abdomen: Soft, non tender, No hepatosplenomegaly. Bowel sounds positive. No suprapubic tenderness or CVAT  Musculoskeletal: no clubbing / cyanosis. Moves all extremities Skin: no jaundice, No lesions.  Neurologic: Sensation intact  Strength equal in all extremities  Psychiatric:   Alert and oriented x 3. Normal mood.     Labs on Admission: I have personally reviewed following labs and imaging studies  CBC:  Recent Labs Lab 01/22/17 1027 01/22/17 1103  WBC 8.1  --     HGB 13.9 14.6  HCT 40.6 43.0  MCV 95.8  --   PLT 257  --     Basic Metabolic Panel:  Recent Labs Lab 01/22/17 1027 01/22/17 1103  NA 137 139  K 3.3* 3.3*  CL 97* 96*  CO2 25  --   GLUCOSE 105* 103*  BUN 14 19  CREATININE 0.95 0.90  CALCIUM 9.4  --     GFR: Estimated Creatinine Clearance: 42.6 mL/min (by C-G formula based on SCr of 0.9 mg/dL).  Liver Function Tests:  No results for input(s): AST, ALT, ALKPHOS, BILITOT, PROT, ALBUMIN in the last 168 hours. No results for input(s): LIPASE, AMYLASE in the last 168 hours. No results for input(s): AMMONIA in the last 168 hours.  Coagulation Profile: No results for input(s): INR, PROTIME in the last 168 hours.  Cardiac Enzymes: No results for input(s): CKTOTAL, CKMB, CKMBINDEX, TROPONINI in the last 168 hours.  BNP (last 3 results) No results for input(s): PROBNP in the last 8760 hours.  HbA1C: No results for input(s): HGBA1C in the last 72 hours.  CBG:  Recent Labs Lab 01/22/17 1040  GLUCAP 106*    Lipid Profile: No results for input(s): CHOL, HDL, LDLCALC, TRIG, CHOLHDL, LDLDIRECT in the last 72 hours.  Thyroid Function Tests: No results for input(s): TSH, T4TOTAL, FREET4, T3FREE, THYROIDAB in the last 72 hours.  Anemia Panel: No results for input(s): VITAMINB12, FOLATE, FERRITIN, TIBC, IRON, RETICCTPCT in the last 72 hours.  Urine analysis:    Component Value Date/Time   COLORURINE YELLOW 01/22/2017 1221   APPEARANCEUR HAZY (A) 01/22/2017 1221   LABSPEC 1.012 01/22/2017 1221   PHURINE 6.0 01/22/2017 1221   GLUCOSEU NEGATIVE 01/22/2017 1221   HGBUR NEGATIVE 01/22/2017 1221   BILIRUBINUR NEGATIVE 01/22/2017 1221   KETONESUR 20 (A) 01/22/2017 1221   PROTEINUR 30 (A) 01/22/2017 1221   NITRITE NEGATIVE 01/22/2017 1221   LEUKOCYTESUR TRACE (A) 01/22/2017 1221    Sepsis Labs: @LABRCNTIP (procalcitonin:4,lacticidven:4) )No results found for this or any previous visit (from the past 240 hour(s)).    Radiological Exams on Admission: Dg Chest 2 View  Result Date: 01/22/2017 CLINICAL DATA:  Weakness EXAM: CHEST  2 VIEW COMPARISON:  None. FINDINGS: Marked cardiomegaly. Bibasilar atelectasis. No effusions or edema. No acute bony abnormality. IMPRESSION: Cardiomegaly.  Bibasilar atelectasis. Electronically Signed   By: Charlett Nose M.D.   On: 01/22/2017 11:20    EKG: Independently reviewed.  Assessment/Plan Active Problems:   Generalized weakness   Coronary artery disease due to lipid rich plaque   Essential hypertension   Hyperlipidemia   Former smoker   Chronic anticoagulation   Generalized weakness : Etiology likely multifactorial including atrial fibrillation, medications, versus infection as she may have been exposed to pneumonia versus flu .Patient intermittent episodes of nausea and she reports difficulty  to stand quickly due to weakness. No focal motor weakness. No apparent orthostatic symptoms . No syncope or presyncope She states that these symptoms are similar to those of her prior admission for stent placement. Currently EKG, TN unrevealing, CXR NAD. UA remarkable for trace leukocytes. WBC normal. TSH pending She is afebrile and VSS Admit to tele obs   gentle IVF as she can take by mouth   orthostatics Continue to monitor EKG q 8 h  Urine culture Influenza panel  Hb A1C  If any changes regarding her cardiovascular status will consult Cards   Hypertension BP 155/84   Pulse 86 . She has taken her daily meds already  Controlled Continue home anti-hypertensive medications in am unless orthostatic Check OH   Hyperlipidemia Continue home Zocor    Atrial Fibrillation CHA2DS2-VASc score 5, on anticoagulation with Xarelto   Rate controlled Continue meds  Hypothyroidism: TSH is pending . May play some role for her current symptoms  -Continue home Synthroid   Depression Continue home Paxil     Hypokalemia, patient did not take her daily K supplement today. Current  K 3.3. No acute findings on EKG and Tn negative  Continue Kdur therapy  Repeat CMET in  am    DVT prophylaxis:  Xarelto   Code Status:   Full    Family Communication:  Discussed with patient Disposition Plan: Expect patient to be discharged to home after condition improves Consults called:    None Admission status:Tele  Obs   Loc Feinstein E, PA-C [facility]   01/22/2017, 2:30 PM

## 2017-01-22 NOTE — ED Provider Notes (Addendum)
[facility]-EMERGENCY DEPT Provider Note   CSN: [id number] Arrival date & time: 01/22/17  1001     History   Chief Complaint Chief Complaint  Patient presents with  . Weakness    HPI Christina Ortiz is a 83 y.o. female.  83 yo F with a chief complaint of generalized weakness. Going on the course of 2 weeks. Patient has issues on upon exertion. Becomes very weak and has this down and rest. Some shortness of breath with this as well. Patient states the last time she had a stent placed she had similar symptoms. Denies any chest pain denies diaphoresis. Had a vomiting and diarrheal illness earlier in the course which has resolved. Has been tolerating by by mouth but less than normal.   The history is provided by the patient.  Weakness  Primary symptoms include no dizziness. This is a new problem. The current episode started more than 2 days ago. The problem has not changed since onset.There has been no fever. The fever has been present for less than 1 day. Pertinent negatives include no shortness of breath, no chest pain, no vomiting and no headaches.  Illness  This is a new problem. The current episode started more than 1 week ago. The problem occurs constantly. The problem has not changed since onset.Pertinent negatives include no chest pain, no headaches and no shortness of breath. The symptoms are aggravated by walking and exertion. The symptoms are relieved by rest. She has tried nothing for the symptoms.    Past Medical History:  Diagnosis Date  . Arthritis   . CKD (chronic kidney disease)    Xarelto dose is 15 mg QD  . Coronary artery disease    a. s/p PCI/stenting to RCA and LAD ([facility] in [place]) in 2006; b. Nuc 1/15: No ischemia, EF 70%  . Depression   . Gastritis   . History of cholecystectomy   . History of echocardiogram    a. Echo 1/15: Moderate LVH, EF 55-60%, impaired relaxation, mild AS  . Hyperlipidemia   . Hypertension   . Mobitz type 1 second degree  atrioventricular block    a. Event Monitor 3/15: NSR, first-degree AV block, second-degree AB block - Mobitz 1, PACs, NSVT (5 beats)  . PAF (paroxysmal atrial fibrillation) (HCC)   . Thyroid disease     Patient Active Problem List   Diagnosis Date Noted  . Weakness 01/22/2017  . Coronary artery disease due to lipid rich plaque 04/04/2015  . Essential hypertension 04/04/2015  . Hyperlipidemia 04/04/2015  . Former smoker 04/04/2015  . Chronic anticoagulation 04/04/2015    Past Surgical History:  Procedure Laterality Date  . BREAST LUMPECTOMY Left   . CARDIAC CATHETERIZATION    . CATARACT EXTRACTION Bilateral   . DILATION AND CURETTAGE OF UTERUS      OB History    No data available       Home Medications    Prior to Admission medications   Medication Sig Start Date End Date Taking? Authorizing Provider  amLODipine (NORVASC) 10 MG tablet Take 1 tablet (10 mg total) by mouth daily. 11/06/15   Jake Bathe, MD  doxazosin (CARDURA) 8 MG tablet Take 8 mg by mouth daily.    Historical Provider, MD  levothyroxine (SYNTHROID, LEVOTHROID) 75 MCG tablet Take 75 mcg by mouth daily before breakfast.    Historical Provider, MD  metoprolol succinate (TOPROL-XL) 25 MG 24 hr tablet Take 0.5 tablets (12.5 mg total) by mouth daily. 01/06/16  Beatrice Lecher, PA-C  omeprazole (PRILOSEC) 20 MG capsule Take 20 mg by mouth daily.    Historical Provider, MD  PARoxetine (PAXIL) 40 MG tablet Take 40 mg by mouth every morning.    Historical Provider, MD  potassium chloride (KLOR-CON) 8 MEQ tablet Take 8 mEq by mouth daily.    Historical Provider, MD  quinapril (ACCUPRIL) 40 MG tablet Take 40 mg by mouth daily. 01/23/15   Historical Provider, MD  simvastatin (ZOCOR) 5 MG tablet Take 5 mg by mouth daily. 01/28/15   Historical Provider, MD  XARELTO 20 MG TABS tablet TAKE 1 TABLET (20 MG TOTAL) BY MOUTH DAILY WITH SUPPER. 01/14/17   Beatrice Lecher, PA-C    Family History Family History  Problem Relation  Age of Onset  . Heart disease Mother   . Lung cancer Father     Social History Social History  Substance Use Topics  . Smoking status: Former Games developer  . Smokeless tobacco: Not on file     Comment: quit 2015  . Alcohol use Not on file     Allergies   Patient has no known allergies.   Review of Systems Review of Systems  Constitutional: Negative for chills and fever.  HENT: Negative for congestion and rhinorrhea.   Eyes: Negative for redness and visual disturbance.  Respiratory: Negative for shortness of breath and wheezing.   Cardiovascular: Negative for chest pain and palpitations.  Gastrointestinal: Negative for nausea and vomiting.  Genitourinary: Negative for dysuria and urgency.  Musculoskeletal: Negative for arthralgias and myalgias.  Skin: Negative for pallor and wound.  Neurological: Positive for weakness. Negative for dizziness and headaches.     Physical Exam Updated Vital Signs BP 155/84   Pulse 86   Temp 98.7 F (37.1 C) (Oral)   Resp 21   Ht 5\' 3"  (1.6 m)   Wt 152 lb (68.9 kg)   SpO2 96%   BMI 26.93 kg/m   Physical Exam  Constitutional: She is oriented to person, place, and time. She appears well-developed and well-nourished. No distress.  HENT:  Head: Normocephalic and atraumatic.  Eyes: EOM are normal. Pupils are equal, round, and reactive to light.  Neck: Normal range of motion. Neck supple.  Cardiovascular: Normal rate and regular rhythm.  Exam reveals no gallop and no friction rub.   No murmur heard. Pulmonary/Chest: Effort normal. She has no wheezes. She has no rales.  Abdominal: Soft. She exhibits no distension and no mass. There is no tenderness. There is no guarding.  Musculoskeletal: She exhibits no edema or tenderness.  Neurological: She is alert and oriented to person, place, and time.  Skin: Skin is warm and dry. She is not diaphoretic.  Psychiatric: She has a normal mood and affect. Her behavior is normal.  Nursing note and vitals  reviewed.    ED Treatments / Results  Labs (all labs ordered are listed, but only abnormal results are displayed) Labs Reviewed  BASIC METABOLIC PANEL - Abnormal; Notable for the following:       Result Value   Potassium 3.3 (*)    Chloride 97 (*)    Glucose, Bld 105 (*)    GFR calc non Af Amer 53 (*)    All other components within normal limits  URINALYSIS, ROUTINE W REFLEX MICROSCOPIC - Abnormal; Notable for the following:    APPearance HAZY (*)    Ketones, ur 20 (*)    Protein, ur 30 (*)    Leukocytes, UA TRACE (*)  Bacteria, UA RARE (*)    Squamous Epithelial / LPF 0-5 (*)    All other components within normal limits  CBG MONITORING, ED - Abnormal; Notable for the following:    Glucose-Capillary 106 (*)    All other components within normal limits  I-STAT CHEM 8, ED - Abnormal; Notable for the following:    Potassium 3.3 (*)    Chloride 96 (*)    Glucose, Bld 103 (*)    Calcium, Ion 1.12 (*)    All other components within normal limits  CBC  TSH  T4  I-STAT TROPOININ, ED    EKG  EKG Interpretation  Date/Time:  Saturday January 22 2017 10:08:15 EST Ventricular Rate:  82 PR Interval:  246 QRS Duration: 70 QT Interval:  394 QTC Calculation: 460 R Axis:   151 Text Interpretation:  Sinus rhythm with 1st degree A-V block Right axis deviation Pulmonary disease pattern Septal infarct , age undetermined Abnormal ECG No old tracing to compare Confirmed by Draden Cottingham MD, DANIEL [phone]) on 01/22/2017 12:58:25 PM       Radiology Dg Chest 2 View  Result Date: 01/22/2017 CLINICAL DATA:  Weakness EXAM: CHEST  2 VIEW COMPARISON:  None. FINDINGS: Marked cardiomegaly. Bibasilar atelectasis. No effusions or edema. No acute bony abnormality. IMPRESSION: Cardiomegaly.  Bibasilar atelectasis. Electronically Signed   By: Charlett Nose M.D.   On: 01/22/2017 11:20    Procedures Procedures (including critical care time)  Medications Ordered in ED Medications - No data to  display   Initial Impression / Assessment and Plan / ED Course  I have reviewed the triage vital signs and the nursing notes.  Pertinent labs & imaging results that were available during my care of the patient were reviewed by me and considered in my medical decision making (see chart for details).     83 yo F With a chief complaint of generalized weakness. Patient states this is like her prior MI. We'll place in the hospital for serial troches and possible stress.  The patients results and plan were reviewed and discussed.   Any x-rays performed were independently reviewed by myself.   Differential diagnosis were considered with the presenting HPI.  Medications - No data to display  Vitals:   01/22/17 1319 01/22/17 1321 01/22/17 1330 01/22/17 1345  BP:  138/84 125/57 155/84  Pulse: 85 84 85 86  Resp: 21 20 22 21   Temp:      TempSrc:      SpO2:  93% 96% 96%  Weight:  152 lb (68.9 kg)    Height:  5\' 3"  (1.6 m)      Final diagnoses:  Weakness  Exertional shortness of breath    Admission/ observation were discussed with the admitting physician, patient and/or family and they are comfortable with the plan.    Final Clinical Impressions(s) / ED Diagnoses   Final diagnoses:  Weakness  Exertional shortness of breath    New Prescriptions New Prescriptions   No medications on file     Melene Plan, DO 01/22/17 1355    Melene Plan, DO 01/22/17 1356

## 2017-01-22 NOTE — ED Triage Notes (Signed)
Patient here with increased weakness x 5 days. denies pain, no shortness of breath. Alert and oriented, NAD

## 2017-01-22 NOTE — ED Notes (Signed)
Pt transported to xray 

## 2017-01-22 NOTE — ED Notes (Signed)
Pt given crackers and coffee per MD Adela Lank. Updates on plan of care at this time.

## 2017-01-23 ENCOUNTER — Encounter (HOSPITAL_COMMUNITY): Payer: Self-pay | Admitting: Surgery

## 2017-01-23 DIAGNOSIS — I10 Essential (primary) hypertension: Secondary | ICD-10-CM | POA: Diagnosis not present

## 2017-01-23 DIAGNOSIS — R531 Weakness: Secondary | ICD-10-CM | POA: Diagnosis not present

## 2017-01-23 LAB — GLUCOSE, CAPILLARY: Glucose-Capillary: 96 mg/dL (ref 65–99)

## 2017-01-23 LAB — COMPREHENSIVE METABOLIC PANEL
ALT: 11 U/L — ABNORMAL LOW (ref 14–54)
ANION GAP: 11 (ref 5–15)
AST: 37 U/L (ref 15–41)
Albumin: 3.2 g/dL — ABNORMAL LOW (ref 3.5–5.0)
Alkaline Phosphatase: 65 U/L (ref 38–126)
BILIRUBIN TOTAL: 0.8 mg/dL (ref 0.3–1.2)
BUN: 7 mg/dL (ref 6–20)
CO2: 27 mmol/L (ref 22–32)
Calcium: 8.8 mg/dL — ABNORMAL LOW (ref 8.9–10.3)
Chloride: 102 mmol/L (ref 101–111)
Creatinine, Ser: 0.7 mg/dL (ref 0.44–1.00)
GFR calc Af Amer: >60 mL/min (ref >60)
Glucose, Bld: 89 mg/dL (ref 65–99)
POTASSIUM: 3.1 mmol/L — AB (ref 3.5–5.1)
Sodium: 140 mmol/L (ref 135–145)
TOTAL PROTEIN: 6.6 g/dL (ref 6.5–8.1)

## 2017-01-23 LAB — URINE CULTURE: Culture: <10000 — AB

## 2017-01-23 LAB — CBC
HEMATOCRIT: 39.5 % (ref 36.0–46.0)
Hemoglobin: 13.1 g/dL (ref 12.0–15.0)
MCH: 31.8 pg (ref 26.0–34.0)
MCHC: 33.2 g/dL (ref 30.0–36.0)
MCV: 95.9 fL (ref 78.0–100.0)
Platelets: 223 10*3/uL (ref 150–400)
RBC: 4.12 MIL/uL (ref 3.87–5.11)
RDW: 12.9 % (ref 11.5–15.5)
WBC: 7.4 10*3/uL (ref 4.0–10.5)

## 2017-01-23 LAB — HEMOGLOBIN A1C
HEMOGLOBIN A1C: 5.1 % (ref 4.8–5.6)
Mean Plasma Glucose: 100 mg/dL

## 2017-01-23 LAB — T4: T4, Total: 8.5 ug/dL (ref 4.5–12.0)

## 2017-01-23 MED ORDER — OSELTAMIVIR PHOSPHATE 30 MG PO CAPS
30.0000 mg | ORAL_CAPSULE | Freq: Two times a day (BID) | ORAL | Status: DC
  Administered 2017-01-23 – 2017-01-25 (×5): 30 mg via ORAL
  Filled 2017-01-23 (×5): qty 1

## 2017-01-23 MED ORDER — POTASSIUM CHLORIDE CRYS ER 20 MEQ PO TBCR
40.0000 meq | EXTENDED_RELEASE_TABLET | Freq: Once | ORAL | Status: AC
Start: 2017-01-23 — End: 2017-01-23
  Administered 2017-01-23: 40 meq via ORAL
  Filled 2017-01-23: qty 2

## 2017-01-23 MED ORDER — SODIUM CHLORIDE 0.9 % IV SOLN
INTRAVENOUS | Status: AC
  Administered 2017-01-23: 15:00:00 via INTRAVENOUS

## 2017-01-23 NOTE — Progress Notes (Addendum)
Patient ID: Christina Ortiz, female   DOB: Jul 19, 1929, 83 y.o.   MRN: [id number]    PROGRESS NOTE    Christina Ortiz  RAQ:[id number] DOB: 1929-07-23 DOA: 01/22/2017  PCP: Astrid Divine, MD   Brief Narrative:  83 y.o. female with medical history significant for atrial fibrillation on Xarelto, hypertension, hyperlipidemia, a history of CAD, hypothyroidism, CK D, depression, presenting with 2 week history of increasing generalized weakness, subjective fevers, chills, poor oral intake.   Assessment & Plan:   Generalized weakness, dehydration  - suspect related to influenza infections, PCR + for Influenza A  - pt reports feeling tired but overall better, still with rather dry mucous membranes and poor skin turgor - start Tamiflu, ask for PT/OT eval  - continue IVF for now until oral intake improves  - encouraged oral intake and ambulation   Hypertension, accelerated - continue Norvasc, Metoprolol, Quinapril   Hyperlipidemia - Continue home Zocor   Atrial Fibrillation - CHA2DS2-VASc score 5, on anticoagulation with Xarelto  - continue Metoprolol   Hypothyroidism - Continue home Synthroid - TSH is WNL    Depression Continue home Paxil     Hypokalemia - supplement and repeat BMP In AM  DVT prophylaxis: Rivaroxaban  Code Status: Full  Family Communication: Patient at bedside  Disposition Plan: Home in 1-2 days   Consultants:   None  Procedures:   None  Antimicrobials:   Tamiflu 1/28 -->  Subjective: No events overnight, still feels weak.   Objective: Vitals:   01/22/17 1415 01/22/17 2027 01/23/17 0314 01/23/17 0508  BP: 154/82 134/76  (!) 155/86  Pulse: 88 78  91  Resp: 19 19  19   Temp:  99.8 F (37.7 C)  98.2 F (36.8 C)  TempSrc:      SpO2: 94% 97%  93%  Weight:   69.4 kg (153 lb)   Height:        Intake/Output Summary (Last 24 hours) at 01/23/17 1210 Last data filed at 01/23/17 1100  Gross per 24 hour  Intake           1677.5 ml    Output                0 ml  Net           1677.5 ml   Filed Weights   01/22/17 1321 01/23/17 0314  Weight: 68.9 kg (152 lb) 69.4 kg (153 lb)   Examination:  General exam: Appears calm and comfortable, poor skin turgor and dry MM Respiratory system: Respiratory effort normal. Diminished air movement at bases  Cardiovascular system: IRRR. No JVD, rubs, gallops or clicks. No pedal edema. Gastrointestinal system: Abdomen is nondistended, soft and nontender. No organomegaly or masses felt.  Central nervous system: Alert and oriented. No focal neurological deficits. Extremities: Symmetric 5 x 5 power.  Data Reviewed: I have personally reviewed following labs and imaging studies  CBC:  Recent Labs Lab 01/22/17 1027 01/22/17 1103 01/23/17 0516  WBC 8.1  --  7.4  HGB 13.9 14.6 13.1  HCT 40.6 43.0 39.5  MCV 95.8  --  95.9  PLT 257  --  223   Basic Metabolic Panel:  Recent Labs Lab 01/22/17 1027 01/22/17 1103 01/23/17 0516  NA 137 139 140  K 3.3* 3.3* 3.1*  CL 97* 96* 102  CO2 25  --  27  GLUCOSE 105* 103* 89  BUN 14 19 7   CREATININE 0.95 0.90 0.70  CALCIUM 9.4  --  8.8*  Liver Function Tests:  Recent Labs Lab 01/23/17 0516  AST 37  ALT 11*  ALKPHOS 65  BILITOT 0.8  PROT 6.6  ALBUMIN 3.2*   HbA1C:  Recent Labs  01/22/17 1027  HGBA1C 5.1   CBG:  Recent Labs Lab 01/22/17 1040  GLUCAP 106*   Thyroid Function Tests:  Recent Labs  01/22/17 1316  TSH 1.276  T4TOTAL 8.5   Urine analysis:    Component Value Date/Time   COLORURINE YELLOW 01/22/2017 1221   APPEARANCEUR HAZY (A) 01/22/2017 1221   LABSPEC 1.012 01/22/2017 1221   PHURINE 6.0 01/22/2017 1221   GLUCOSEU NEGATIVE 01/22/2017 1221   HGBUR NEGATIVE 01/22/2017 1221   BILIRUBINUR NEGATIVE 01/22/2017 1221   KETONESUR 20 (A) 01/22/2017 1221   PROTEINUR 30 (A) 01/22/2017 1221   NITRITE NEGATIVE 01/22/2017 1221   LEUKOCYTESUR TRACE (A) 01/22/2017 1221    Recent Results (from the past  240 hour(s))  Urine culture     Status: Abnormal   Collection Time: 01/22/17  3:40 PM  Result Value Ref Range Status   Specimen Description URINE, RANDOM  Final   Special Requests NONE  Final   Culture <10,000 COLONIES/mL INSIGNIFICANT GROWTH (A)  Final   Report Status 01/23/2017 FINAL  Final    Radiology Studies: Dg Chest 2 View Result Date: 01/22/2017 Cardiomegaly.  Bibasilar atelectasis.  Scheduled Meds: . amLODipine  10 mg Oral Daily  . doxazosin  8 mg Oral Daily  . levothyroxine  75 mcg Oral QAC breakfast  . metoprolol succinate  12.5 mg Oral Daily  . pantoprazole  40 mg Oral Daily  . PARoxetine  40 mg Oral Daily  . potassium chloride  8 mEq Oral Daily  . quinapril  40 mg Oral Daily  . rivaroxaban  20 mg Oral Q supper  . simvastatin  5 mg Oral q1800   Continuous Infusions: . sodium chloride 75 mL/hr at 01/22/17 1609     LOS: 0 days   Time spent: 20 minutes   Debbora Presto, MD [facility] Pager [phone]  If 7PM-7AM, please contact night-coverage [email or web address] Password [email or web address] 01/23/2017, 12:10 PM

## 2017-01-23 NOTE — Care Management Obs Status (Signed)
MEDICARE OBSERVATION STATUS NOTIFICATION   Patient Details  Name: Christina Ortiz MRN: [id number] Date of Birth: May 24, 1929   Medicare Observation Status Notification Given:  Yes    Darrold Span, RN 01/23/2017, 1:55 PM

## 2017-01-24 DIAGNOSIS — J111 Influenza due to unidentified influenza virus with other respiratory manifestations: Secondary | ICD-10-CM | POA: Diagnosis not present

## 2017-01-24 DIAGNOSIS — I10 Essential (primary) hypertension: Secondary | ICD-10-CM | POA: Diagnosis not present

## 2017-01-24 DIAGNOSIS — E876 Hypokalemia: Secondary | ICD-10-CM | POA: Diagnosis not present

## 2017-01-24 DIAGNOSIS — Z7901 Long term (current) use of anticoagulants: Secondary | ICD-10-CM | POA: Diagnosis not present

## 2017-01-24 LAB — BASIC METABOLIC PANEL
Anion gap: 10 (ref 5–15)
BUN: 7 mg/dL (ref 6–20)
CALCIUM: 8.6 mg/dL — AB (ref 8.9–10.3)
CHLORIDE: 105 mmol/L (ref 101–111)
CO2: 25 mmol/L (ref 22–32)
CREATININE: 0.61 mg/dL (ref 0.44–1.00)
Glucose, Bld: 93 mg/dL (ref 65–99)
Potassium: 2.9 mmol/L — ABNORMAL LOW (ref 3.5–5.1)
SODIUM: 140 mmol/L (ref 135–145)

## 2017-01-24 LAB — CBC
HEMATOCRIT: 38.3 % (ref 36.0–46.0)
HEMOGLOBIN: 12.5 g/dL (ref 12.0–15.0)
MCH: 31.4 pg (ref 26.0–34.0)
MCHC: 32.6 g/dL (ref 30.0–36.0)
MCV: 96.2 fL (ref 78.0–100.0)
Platelets: 204 10*3/uL (ref 150–400)
RBC: 3.98 MIL/uL (ref 3.87–5.11)
RDW: 13.3 % (ref 11.5–15.5)
WBC: 7.1 10*3/uL (ref 4.0–10.5)

## 2017-01-24 MED ORDER — MAGNESIUM OXIDE 400 (241.3 MG) MG PO TABS
400.0000 mg | ORAL_TABLET | Freq: Two times a day (BID) | ORAL | Status: AC
  Administered 2017-01-24 (×2): 400 mg via ORAL
  Filled 2017-01-24 (×2): qty 1

## 2017-01-24 MED ORDER — POTASSIUM CHLORIDE CRYS ER 20 MEQ PO TBCR
40.0000 meq | EXTENDED_RELEASE_TABLET | Freq: Two times a day (BID) | ORAL | Status: AC
  Administered 2017-01-24 (×2): 40 meq via ORAL
  Filled 2017-01-24 (×2): qty 2

## 2017-01-24 NOTE — Progress Notes (Signed)
PT Cancellation Note  Patient Details Name: Christina Ortiz MRN: [id number] DOB: 1929-10-03   Cancelled Treatment:    Reason Eval/Treat Not Completed: Other (comment).  Pt states she does not see the point of two rehab evaluations, even after PT explained the differences.  Pt feels she is independent enough and despite her observation of feeling a little weak, has declined PT services.  Will check tomorrow to see if she has changed her mind, and encouraged pt to ask nursing to get PT back if needed.  Will leave PT order open til tomorrow for this purpose.   Ivar Drape 01/24/2017, 12:20 PM   Samul Dada, PT MS Acute Rehab Dept. Number: [facility] [id number] and [facility] [phone]

## 2017-01-24 NOTE — Evaluation (Signed)
Occupational Therapy Evaluation Patient Details Name: Christina Ortiz MRN: [id number] DOB: [birth year] Today's Date: 01/24/2017    History of Present Illness Pt admitted on 01/22/17 w/ generalized weakness. Pt had tested positive for influenza A   Clinical Impression   Pt was admitted as per above. She is currently Mod I level for ADL's and self care tasks. Brief dicsussion WU:[id number] conservation techniques and prioritizing activity/ADL's and leaving non-essential tasks (Pt verbalized understanding of this). Discussed role of OT and pt reports that she will have intermittent assistance from primary care attendent whom cares for her husband (whom is bedridden and currently hospitialized with pneumonia). PCA does some light housework as well. Pt also states that family/daughter can assist PRN. She denies any acute OT needs at this time, will sign off.    Follow Up Recommendations  No OT follow up;Supervision - Intermittent    Equipment Recommendations  None recommended by OT    Recommendations for Other Services       Precautions / Restrictions Restrictions Weight Bearing Restrictions: No      Mobility Bed Mobility Overal bed mobility: Independent                Transfers Overall transfer level: Modified independent               General transfer comment: Mod I - increased time for transfers     Balance Overall balance assessment: No apparent balance deficits (not formally assessed)                                          ADL Overall ADL's : Modified independent                                       General ADL Comments: Pt is overall Mod I. She is Mod I transfers (bed, toilet, walk in shower); Mod I sitting EOB to doff/don socks - crosses one leg over the other. Brief dicsussion YN:WGNFAO conservation techniques and prioritizing activity/ADL's and leaving non-essential tasks (Pt verbalized understanding of this). Able to  stand at the sink for simulated grooming (pt has already washed up prior to OT arrival today). Discussed role of OT and pt reports that she will have intermittent assistance from primary care attendent whom assists/cares for her husband (whom is bedridden and currently hospitialized with pneumonia). PCA does some light housework as well. Pt also states that family/daughter can assist PRN. She denies any acute OT needs at this time, will sign off.     Vision  No change from baseline. Wears glasses for reading. Wears glasses for driving.   Perception     Praxis      Pertinent Vitals/Pain Pain Assessment: No/denies pain     Hand Dominance Right   Extremity/Trunk Assessment Upper Extremity Assessment Upper Extremity Assessment: Overall WFL for tasks assessed   Lower Extremity Assessment Lower Extremity Assessment: Defer to PT evaluation       Communication Communication Communication: No difficulties   Cognition Arousal/Alertness: Awake/alert Behavior During Therapy: WFL for tasks assessed/performed Overall Cognitive Status: Within Functional Limits for tasks assessed                     General Comments       Exercises  Shoulder Instructions      Home Living Family/patient expects to be discharged to:: Private residence Living Arrangements: Spouse/significant other Available Help at Discharge: Family;Available PRN/intermittently (Pt has PCA for husband whom is bedridden) Type of Home: Other(Comment) (Condo) Home Access: Level entry   Entrance Stairs-Rails: None Home Layout: One level     Bathroom Shower/Tub: Producer, television/film/video: Standard     Home Equipment: Hand held shower head;Shower seat - built in;Grab bars - toilet;Grab bars - tub/shower;Walker - 2 wheels;Cane - single point;Wheelchair - manual   Additional Comments: Some equipment is pt husbands, he is currently bedridden x1 month. He is also hopsitialized with pneumonia.       Prior Functioning/Environment Level of Independence: Independent        Comments: PCA for husband also does some light housekeeping for both.        OT Problem List:     OT Treatment/Interventions:      OT Goals(Current goals can be found in the care plan section) Acute Rehab OT Goals Patient Stated Goal: Get wel land go home. Help to take care of my husband OT Goal Formulation: All assessment and education complete, DC therapy  OT Frequency:     Barriers to D/C:            Co-evaluation              End of Session Equipment Utilized During Treatment:  (None) Nurse Communication: Mobility status;Other (comment) (No acute OT needs)  Activity Tolerance: Patient tolerated treatment well Patient left: in bed;with call bell/phone within reach   Time: 1055-1116 OT Time Calculation (min): 21 min Charges:  OT General Charges $OT Visit: 1 Procedure OT Evaluation $OT Eval Low Complexity: 1 Procedure OT Treatments $Self Care/Home Management : 8-22 mins G-Codes: OT G-codes **NOT FOR INPATIENT CLASS** Functional Assessment Tool Used: Clinical judgement; self care tasks/ADL's Functional Limitation: Self care Self Care Current Status (P5465): At least 1 percent but less than 20 percent impaired, limited or restricted Self Care Goal Status (K8127): At least 1 percent but less than 20 percent impaired, limited or restricted Self Care Discharge Status [phone]): At least 1 percent but less than 20 percent impaired, limited or restricted  Alm Bustard, OTR/L 01/24/2017, 11:33 AM

## 2017-01-24 NOTE — Progress Notes (Signed)
[facility] PROGRESS NOTE    Progress Note  Christina Ortiz  ONG:[id number] DOB: Sep 12, 1929 DOA: 01/22/2017 PCP: Astrid Divine, MD     Brief Narrative:   Christina Ortiz is an 83 y.o. female with medical history significant for atrial fibrillation on Xarelto, hypertension, hyperlipidemia, a history of CAD, hypothyroidism, CK D, depression, presenting with 2 week history of increasing generalized weakness, subjective fevers, chills, poor oral intake.   Assessment/Plan:   Influenza with respiratory manifestations: - PCR + for Influenza A   - Continue Tamiflu, physical therapy evaluation is pending. - D/ IV fluids. - encouraged oral intake and ambulation   Hypertension, accelerated - continue Norvasc, Metoprolol, Quinapril   Hyperlipidemia - Continue home Zocor   Atrial Fibrillation - CHA2DS2-VASc score 5, on anticoagulation with Xarelto  - continue Metoprolol   Hypothyroidism - Continue home Synthroid - TSH is WNL   Depression Continue home Paxil   Hypokalemia - Replete orally more aggressively check basic metabolic panel in the morning.   DVT prophylaxis: lovenox Family Communication:none Disposition Plan/Barrier to D/C: home in am Code Status:     Code Status Orders        Start     Ordered   01/22/17 1406  Full code  Continuous     01/22/17 1412    Code Status History    Date Active Date Inactive Code Status Order ID Comments User Context   This patient has a current code status but no historical code status.        IV Access:    Peripheral IV   Procedures and diagnostic studies:   Dg Chest 2 View  Result Date: 01/22/2017 CLINICAL DATA:  Weakness EXAM: CHEST  2 VIEW COMPARISON:  None. FINDINGS: Marked cardiomegaly. Bibasilar atelectasis. No effusions or edema. No acute bony abnormality. IMPRESSION: Cardiomegaly.  Bibasilar atelectasis. Electronically Signed   By: Charlett Nose M.D.   On: 01/22/2017 11:20      Medical Consultants:    None.  Anti-Infectives:   Tamiflu  Subjective:    Nataley Santizo she want to go home weakness improved.  Objective:    Vitals:   01/23/17 0508 01/23/17 1427 01/23/17 2157 01/24/17 0611  BP: (!) 155/86 (!) 117/59 137/81 (!) 143/82  Pulse: 91 82 74 78  Resp: 19  20 20   Temp: 98.2 F (36.8 C) 98.1 F (36.7 C) 98.9 F (37.2 C) 98 F (36.7 C)  TempSrc:  Oral Oral Oral  SpO2: 93% 95% 94% 94%  Weight:    67.3 kg (148 lb 4.8 oz)  Height:        Intake/Output Summary (Last 24 hours) at 01/24/17 0829 Last data filed at 01/23/17 1445  Gross per 24 hour  Intake              720 ml  Output              400 ml  Net              320 ml   Filed Weights   01/22/17 1321 01/23/17 0314 01/24/17 0611  Weight: 68.9 kg (152 lb) 69.4 kg (153 lb) 67.3 kg (148 lb 4.8 oz)    Exam: General exam: In no acute distress. Respiratory system: Good air movement and clear to auscultation. Cardiovascular system: S1 & S2 heard, RRR. No JVD. Gastrointestinal system: Abdomen is nondistended, soft and nontender.  Extremities: No pedal edema. Skin: No rashes, lesions or ulcers Psychiatry: Judgement and insight appear normal. Mood & affect  appropriate.    Data Reviewed:    Labs: Basic Metabolic Panel:  Recent Labs Lab 01/22/17 1027 01/22/17 1103 01/23/17 0516 01/24/17 0544  NA 137 139 140 140  K 3.3* 3.3* 3.1* 2.9*  CL 97* 96* 102 105  CO2 25  --  27 25  GLUCOSE 105* 103* 89 93  BUN 14 19 7 7   CREATININE 0.95 0.90 0.70 0.61  CALCIUM 9.4  --  8.8* 8.6*   GFR Estimated Creatinine Clearance: 47.4 mL/min (by C-G formula based on SCr of 0.61 mg/dL). Liver Function Tests:  Recent Labs Lab 01/23/17 0516  AST 37  ALT 11*  ALKPHOS 65  BILITOT 0.8  PROT 6.6  ALBUMIN 3.2*   No results for input(s): LIPASE, AMYLASE in the last 168 hours. No results for input(s): AMMONIA in the last 168 hours. Coagulation profile No results for input(s): INR,  PROTIME in the last 168 hours.  CBC:  Recent Labs Lab 01/22/17 1027 01/22/17 1103 01/23/17 0516 01/24/17 0544  WBC 8.1  --  7.4 7.1  HGB 13.9 14.6 13.1 12.5  HCT 40.6 43.0 39.5 38.3  MCV 95.8  --  95.9 96.2  PLT 257  --  223 204   Cardiac Enzymes: No results for input(s): CKTOTAL, CKMB, CKMBINDEX, TROPONINI in the last 168 hours. BNP (last 3 results) No results for input(s): PROBNP in the last 8760 hours. CBG:  Recent Labs Lab 01/22/17 1040 01/23/17 2158  GLUCAP 106* 96   D-Dimer: No results for input(s): DDIMER in the last 72 hours. Hgb A1c:  Recent Labs  01/22/17 1027  HGBA1C 5.1   Lipid Profile: No results for input(s): CHOL, HDL, LDLCALC, TRIG, CHOLHDL, LDLDIRECT in the last 72 hours. Thyroid function studies:  Recent Labs  01/22/17 1316  TSH 1.276  T4TOTAL 8.5   Anemia work up: No results for input(s): VITAMINB12, FOLATE, FERRITIN, TIBC, IRON, RETICCTPCT in the last 72 hours. Sepsis Labs:  Recent Labs Lab 01/22/17 1027 01/23/17 0516 01/24/17 0544  WBC 8.1 7.4 7.1   Microbiology Recent Results (from the past 240 hour(s))  Urine culture     Status: Abnormal   Collection Time: 01/22/17  3:40 PM  Result Value Ref Range Status   Specimen Description URINE, RANDOM  Final   Special Requests NONE  Final   Culture <10,000 COLONIES/mL INSIGNIFICANT GROWTH (A)  Final   Report Status 01/23/2017 FINAL  Final     Medications:   . amLODipine  10 mg Oral Daily  . doxazosin  8 mg Oral Daily  . levothyroxine  75 mcg Oral QAC breakfast  . metoprolol succinate  12.5 mg Oral Daily  . oseltamivir  30 mg Oral BID  . pantoprazole  40 mg Oral Daily  . PARoxetine  40 mg Oral Daily  . potassium chloride  8 mEq Oral Daily  . potassium chloride  40 mEq Oral BID  . quinapril  40 mg Oral Daily  . rivaroxaban  20 mg Oral Q supper  . simvastatin  5 mg Oral q1800   Continuous Infusions:  Time spent: 25 min   LOS: 0 days   Marinda Elk  [facility] Pager [phone]  *Please refer to [email or web address], password [email or web address] to get updated schedule on who will round on this patient, as hospitalists switch teams weekly. If 7PM-7AM, please contact night-coverage at [email or web address], password [email or web address] for any overnight needs.  01/24/2017, 8:29 AM

## 2017-01-25 DIAGNOSIS — E876 Hypokalemia: Secondary | ICD-10-CM

## 2017-01-25 DIAGNOSIS — J111 Influenza due to unidentified influenza virus with other respiratory manifestations: Secondary | ICD-10-CM

## 2017-01-25 DIAGNOSIS — I10 Essential (primary) hypertension: Secondary | ICD-10-CM | POA: Diagnosis not present

## 2017-01-25 DIAGNOSIS — Z7901 Long term (current) use of anticoagulants: Secondary | ICD-10-CM | POA: Diagnosis not present

## 2017-01-25 LAB — BASIC METABOLIC PANEL
Anion gap: 10 (ref 5–15)
BUN: 9 mg/dL (ref 6–20)
CHLORIDE: 106 mmol/L (ref 101–111)
CO2: 26 mmol/L (ref 22–32)
CREATININE: 0.68 mg/dL (ref 0.44–1.00)
Calcium: 9.1 mg/dL (ref 8.9–10.3)
GFR calc non Af Amer: >60 mL/min (ref >60)
GLUCOSE: 99 mg/dL (ref 65–99)
Potassium: 3.7 mmol/L (ref 3.5–5.1)
Sodium: 142 mmol/L (ref 135–145)

## 2017-01-25 MED ORDER — OSELTAMIVIR PHOSPHATE 30 MG PO CAPS: 30.0000 mg | ORAL_CAPSULE | Freq: Two times a day (BID) | ORAL | 0 refills | Status: DC

## 2017-01-25 NOTE — Progress Notes (Signed)
Christina Ortiz to be D/C'd Home per MD order.  Discussed with the patient and all questions fully answered.  VSS, Skin clean, dry and intact without evidence of skin break down, no evidence of skin tears noted. IV catheter discontinued intact. Site without signs and symptoms of complications. Dressing and pressure applied.  An After Visit Summary was printed and given to the patient. Patient received prescription.  D/c education completed with patient/family including follow up instructions, medication list, d/c activities limitations if indicated, with other d/c instructions as indicated by MD - patient able to verbalize understanding, all questions fully answered.   Patient instructed to return to ED, call 911, or call MD for any changes in condition.   Patient refused escort and left the unit via [facility] with her daughter.  Grayling Congress Darby Shadwick 01/25/2017 1:13 PM

## 2017-01-25 NOTE — Discharge Summary (Signed)
Physician Discharge Summary  Jamirrah Lorah BZM:[id number] DOB: 10-07-1929 DOA: 01/22/2017  PCP: Astrid Divine, MD  Admit date: 01/22/2017 Discharge date: 01/25/2017  Admitted From: home Disposition:  Home  Recommendations for Outpatient Follow-up:  1. Follow up with PCP in 1-2 weeks 2. Please obtain BMP/CBC in one week   Home Health:no Equipment/Devices:none  Discharge Condition:stable CODE STATUS: Full Diet recommendation: Heart Healthy  Brief/Interim Summary: Per admitting MD: 83 y.o. female with medical history significant for atrial fibrillation on Xarelto, hypertension, hyperlipidemia, a history of CAD, hypothyroidism, CK D, depression, presenting with progressive, 2 week history of increasing generalized weakness, worse on exertion. At the time of presentation, she is very weak with minimal activity. She reports minimal shortness of breath on exertion as well. No cough.  At this time, she denies any chest pain, or diaphoresis. She has intermittent nausea and vomiting. She is recovering from the diarrheal illness one week prior to this admission.  Discharge Diagnoses:  Active Problems:   Coronary artery disease due to lipid rich plaque   Essential hypertension   Hyperlipidemia   Former smoker   Chronic anticoagulation   Influenza with respiratory manifestation  Influenza with respiratory manifestations: - PCR + for Influenza A , Started on Tamiflu on admission she was mildly dehydrated so IV fluids it was started and continued for 24 hours. - Continue Tamiflu as an outpatient for 3 additional days, she refuses PT evaluation  Hypertension, accelerated - continue Norvasc, Metoprolol, Quinapril , no changes made to her medication.  Hyperlipidemia - Continue home Zocor   Atrial Fibrillation - CHA2DS2-VASc score 5, on anticoagulation with Xarelto  - continue Metoprolol   Hypothyroidism - Continue home Synthroid - TSH is WNL   Depression Continue home  Paxil   Hypokalemia - Likely due to dehydration does resolve with all oral supplementation.    Discharge Instructions  Discharge Instructions    Diet - low sodium heart healthy    Complete by:  As directed    Increase activity slowly    Complete by:  As directed      Allergies as of 01/25/2017      Reactions   Tape Other (See Comments)   Patient's skin is thin and will TEAR AND BRUISE EASILY      Medication List    TAKE these medications   amLODipine 10 MG tablet Commonly known as:  NORVASC Take 1 tablet (10 mg total) by mouth daily.   doxazosin 8 MG tablet Commonly known as:  CARDURA Take 8 mg by mouth daily.   levothyroxine 75 MCG tablet Commonly known as:  SYNTHROID, LEVOTHROID Take 75 mcg by mouth daily before breakfast.   metoprolol succinate 25 MG 24 hr tablet Commonly known as:  TOPROL-XL Take 0.5 tablets (12.5 mg total) by mouth daily.   omeprazole 20 MG capsule Commonly known as:  PRILOSEC Take 20 mg by mouth daily as needed (heartburn).   oseltamivir 30 MG capsule Commonly known as:  TAMIFLU Take 1 capsule (30 mg total) by mouth 2 (two) times daily.   PARoxetine 40 MG tablet Commonly known as:  PAXIL Take 40 mg by mouth every morning.   potassium chloride 8 MEQ tablet Commonly known as:  KLOR-CON Take 8 mEq by mouth daily.   quinapril 40 MG tablet Commonly known as:  ACCUPRIL Take 40 mg by mouth daily.   simvastatin 5 MG tablet Commonly known as:  ZOCOR Take 5 mg by mouth every evening.   XARELTO 20 MG Tabs tablet Generic  drug:  rivaroxaban TAKE 1 TABLET (20 MG TOTAL) BY MOUTH DAILY WITH SUPPER.       Allergies  Allergen Reactions  . Tape Other (See Comments)    Patient's skin is thin and will TEAR AND BRUISE EASILY    Consultations:  None   Procedures/Studies: Dg Chest 2 View  Result Date: 01/22/2017 CLINICAL DATA:  Weakness EXAM: CHEST  2 VIEW COMPARISON:  None. FINDINGS: Marked cardiomegaly. Bibasilar atelectasis.  No effusions or edema. No acute bony abnormality. IMPRESSION: Cardiomegaly.  Bibasilar atelectasis. Electronically Signed   By: Charlett Nose M.D.   On: 01/22/2017 11:20      Subjective: No complains  Discharge Exam: Vitals:   01/24/17 2215 01/25/17 0436  BP: 139/77 (!) 143/74  Pulse: 70 76  Resp: 18 18  Temp: 98.1 F (36.7 C) 98.3 F (36.8 C)   Vitals:   01/24/17 0611 01/24/17 1411 01/24/17 2215 01/25/17 0436  BP: (!) 143/82 124/63 139/77 (!) 143/74  Pulse: 78 79 70 76  Resp: 20 18 18 18   Temp: 98 F (36.7 C) 98.5 F (36.9 C) 98.1 F (36.7 C) 98.3 F (36.8 C)  TempSrc: Oral Oral Oral Oral  SpO2: 94% 95% 94% 93%  Weight: 67.3 kg (148 lb 4.8 oz)   67.4 kg (148 lb 8 oz)  Height:        General: Pt is alert, awake, not in acute distress Cardiovascular: RRR, S1/S2 +, no rubs, no gallops Respiratory: CTA bilaterally, no wheezing, no rhonchi Abdominal: Soft, NT, ND, bowel sounds + Extremities: no edema, no cyanosis    The results of significant diagnostics from this hospitalization (including imaging, microbiology, ancillary and laboratory) are listed below for reference.     Microbiology: Recent Results (from the past 240 hour(s))  Urine culture     Status: Abnormal   Collection Time: 01/22/17  3:40 PM  Result Value Ref Range Status   Specimen Description URINE, RANDOM  Final   Special Requests NONE  Final   Culture <10,000 COLONIES/mL INSIGNIFICANT GROWTH (A)  Final   Report Status 01/23/2017 FINAL  Final     Labs: BNP (last 3 results) No results for input(s): BNP in the last 8760 hours. Basic Metabolic Panel:  Recent Labs Lab 01/22/17 1027 01/22/17 1103 01/23/17 0516 01/24/17 0544 01/25/17 0619  NA 137 139 140 140 142  K 3.3* 3.3* 3.1* 2.9* 3.7  CL 97* 96* 102 105 106  CO2 25  --  27 25 26   GLUCOSE 105* 103* 89 93 99  BUN 14 19 7 7 9   CREATININE 0.95 0.90 0.70 0.61 0.68  CALCIUM 9.4  --  8.8* 8.6* 9.1   Liver Function Tests:  Recent Labs Lab  01/23/17 0516  AST 37  ALT 11*  ALKPHOS 65  BILITOT 0.8  PROT 6.6  ALBUMIN 3.2*   No results for input(s): LIPASE, AMYLASE in the last 168 hours. No results for input(s): AMMONIA in the last 168 hours. CBC:  Recent Labs Lab 01/22/17 1027 01/22/17 1103 01/23/17 0516 01/24/17 0544  WBC 8.1  --  7.4 7.1  HGB 13.9 14.6 13.1 12.5  HCT 40.6 43.0 39.5 38.3  MCV 95.8  --  95.9 96.2  PLT 257  --  223 204   Cardiac Enzymes: No results for input(s): CKTOTAL, CKMB, CKMBINDEX, TROPONINI in the last 168 hours. BNP: Invalid input(s): POCBNP CBG:  Recent Labs Lab 01/22/17 1040 01/23/17 2158  GLUCAP 106* 96   D-Dimer No results for input(s): DDIMER  in the last 72 hours. Hgb A1c  Recent Labs  01/22/17 1027  HGBA1C 5.1   Lipid Profile No results for input(s): CHOL, HDL, LDLCALC, TRIG, CHOLHDL, LDLDIRECT in the last 72 hours. Thyroid function studies  Recent Labs  01/22/17 1316  TSH 1.276  T4TOTAL 8.5   Anemia work up No results for input(s): VITAMINB12, FOLATE, FERRITIN, TIBC, IRON, RETICCTPCT in the last 72 hours. Urinalysis    Component Value Date/Time   COLORURINE YELLOW 01/22/2017 1221   APPEARANCEUR HAZY (A) 01/22/2017 1221   LABSPEC 1.012 01/22/2017 1221   PHURINE 6.0 01/22/2017 1221   GLUCOSEU NEGATIVE 01/22/2017 1221   HGBUR NEGATIVE 01/22/2017 1221   BILIRUBINUR NEGATIVE 01/22/2017 1221   KETONESUR 20 (A) 01/22/2017 1221   PROTEINUR 30 (A) 01/22/2017 1221   NITRITE NEGATIVE 01/22/2017 1221   LEUKOCYTESUR TRACE (A) 01/22/2017 1221   Sepsis Labs Invalid input(s): PROCALCITONIN,  WBC,  LACTICIDVEN Microbiology Recent Results (from the past 240 hour(s))  Urine culture     Status: Abnormal   Collection Time: 01/22/17  3:40 PM  Result Value Ref Range Status   Specimen Description URINE, RANDOM  Final   Special Requests NONE  Final   Culture <10,000 COLONIES/mL INSIGNIFICANT GROWTH (A)  Final   Report Status 01/23/2017 FINAL  Final     Time  coordinating discharge: Over 30 minutes  SIGNED:   Marinda Elk, MD  [facility] 01/25/2017, 8:02 AM Pager   If 7PM-7AM, please contact night-coverage [email or web address] Password [email or web address]

## 2017-01-25 NOTE — Progress Notes (Signed)
PT Cancellation Note/ Discharge  Patient Details Name: Christina Ortiz MRN: [id number] DOB: 25-Jan-1929   Cancelled Treatment:    Reason Eval/Treat Not Completed: Other (comment). Pt deferred as she had a "therapy" evaluation yesterday and is doing fine. Per OT eval pt functioning at mod I and was educated on energy conservation. No follow up OT recommended. Pt screened and has no acute PT needs at this time. Pt with planned d/c today. PT SIGNING OFF. Please re-consult if needed in furture.   Sherle Mello M Joell Usman 01/25/2017, 9:46 AM  Lewis Shock, PT, DPT Pager #: [phone] Office #: [phone]

## 2017-02-21 ENCOUNTER — Other Ambulatory Visit: Payer: Self-pay | Admitting: Physician Assistant

## 2017-03-09 ENCOUNTER — Other Ambulatory Visit: Payer: Self-pay | Admitting: Physician Assistant

## 2017-03-14 ENCOUNTER — Telehealth: Payer: Self-pay | Admitting: Physician Assistant

## 2017-03-14 NOTE — Telephone Encounter (Signed)
Called pt and left message with daughter [name], informing them that the pt's medication Metoprolol 25 mg tablet it taken 1/2 tablet by mouth daily and that pt need to schedule an overdue yearly appointment before anymore refills.

## 2017-03-22 ENCOUNTER — Encounter: Payer: Self-pay | Admitting: Cardiology

## 2017-03-22 ENCOUNTER — Encounter (INDEPENDENT_AMBULATORY_CARE_PROVIDER_SITE_OTHER): Payer: Self-pay

## 2017-03-22 ENCOUNTER — Ambulatory Visit (INDEPENDENT_AMBULATORY_CARE_PROVIDER_SITE_OTHER): Payer: Medicare PPO | Admitting: Cardiology

## 2017-03-22 VITALS — BP 132/60 | HR 72 | Ht 63.0 in | Wt 149.0 lb

## 2017-03-22 DIAGNOSIS — I480 Paroxysmal atrial fibrillation: Secondary | ICD-10-CM | POA: Diagnosis not present

## 2017-03-22 DIAGNOSIS — I10 Essential (primary) hypertension: Secondary | ICD-10-CM | POA: Diagnosis not present

## 2017-03-22 DIAGNOSIS — I2583 Coronary atherosclerosis due to lipid rich plaque: Secondary | ICD-10-CM | POA: Diagnosis not present

## 2017-03-22 DIAGNOSIS — Z7901 Long term (current) use of anticoagulants: Secondary | ICD-10-CM | POA: Diagnosis not present

## 2017-03-22 DIAGNOSIS — I2510 Atherosclerotic heart disease of native coronary artery without angina pectoris: Secondary | ICD-10-CM

## 2017-03-22 MED ORDER — METOPROLOL SUCCINATE ER 25 MG PO TB24: 25.0000 mg | ORAL_TABLET | Freq: Every day | ORAL | 3 refills | Status: DC

## 2017-03-22 NOTE — Progress Notes (Signed)
.     Cardiology Office Note   Date:  03/22/2017   ID:  Christina Ortiz, DOB 10/16/1929, MRN [id number]  PCP:  Astrid Divine, MD  Cardiologist:   Donato Schultz, MD       History of Present Illness: Christina Ortiz is a 83 y.o. female who presents for her for follow-up history of CAD, 2 previous stents RCA and LAD paroxysmal atrial fibrillation.  She is moved from [facility]. I reviewed medical records from [facility], [facility]. She has a history of percutaneous intervention in 2006. Chronic smoker. No prior stroke or valve-like symptoms. LDL 05/28/14 was 70.  Echocardiogram on 01/22/14 showed moderate LVH, EF 55-60%, diastolic dysfunction, mild aortic stenosis, no pulmonary hypertension. Aortic valve area was 2.06 cm.  Prior EKG on 11/11/11 showed poor R-wave progression, low voltage. She is also had intermittent Mobitz type I second-degree AV block.  She had nuclear stress test on 11/15/11 that showed no definite evidence of ischemia or scar. Regional wall motion was normal.  Chest x-ray on 03/13/12 showed stable findings, hiatal hernia noted.  For her paroxysmal atrial fibrillation, she has been taking Xarelto 15 mg once a day. Metoprolol extended release 25 mg once a day, some he restarted.  Previously, she has had some challenges with control of her hypertension. Hopefully the Toprol 25 mg 1 help with this. We had to stop her amlodipine in the past because of hypotension. She continues to struggle at times with binge drinking alcohol. Unfortunately 2 months ago she lost her husband. She was partying quite a bit in [place] with her brother. She had an episode last weekend of binge drinking and felt her heart rate racing in the early morning after this.    [name] daughter I take car of her.   Past Medical History:  Diagnosis Date  . Arthritis   . CKD (chronic kidney disease)    Xarelto dose is 15 mg QD  . Coronary artery disease      a. s/p PCI/stenting to RCA and LAD ([facility] in [place]) in 2006; b. Nuc 1/15: No ischemia, EF 70%  . Depression   . Gastritis   . History of cholecystectomy   . History of echocardiogram    a. Echo 1/15: Moderate LVH, EF 55-60%, impaired relaxation, mild AS  . Hyperlipidemia   . Hypertension   . Mobitz type 1 second degree atrioventricular block    a. Event Monitor 3/15: NSR, first-degree AV block, second-degree AB block - Mobitz 1, PACs, NSVT (5 beats)  . PAF (paroxysmal atrial fibrillation) (HCC)   . Thyroid disease     Past Surgical History:  Procedure Laterality Date  . BREAST LUMPECTOMY Left   . CARDIAC CATHETERIZATION    . CATARACT EXTRACTION Bilateral   . DILATION AND CURETTAGE OF UTERUS       Current Outpatient Prescriptions  Medication Sig Dispense Refill  . amLODipine (NORVASC) 10 MG tablet Take 1 tablet (10 mg total) by mouth daily. 90 tablet 3  . doxazosin (CARDURA) 8 MG tablet Take 8 mg by mouth daily.    Marland Kitchen levothyroxine (SYNTHROID, LEVOTHROID) 75 MCG tablet Take 75 mcg by mouth daily before breakfast.    . metoprolol succinate (TOPROL-XL) 25 MG 24 hr tablet Take 1 tablet (25 mg total) by mouth daily. 90 tablet 3  . omeprazole (PRILOSEC) 20 MG capsule Take 20 mg by mouth daily as needed (heartburn).     Marland Kitchen oseltamivir (TAMIFLU) 30 MG capsule Take  1 capsule (30 mg total) by mouth 2 (two) times daily. 6 capsule 0  . PARoxetine (PAXIL) 40 MG tablet Take 40 mg by mouth every morning.    . potassium chloride (KLOR-CON) 8 MEQ tablet Take 8 mEq by mouth daily.    . quinapril (ACCUPRIL) 40 MG tablet Take 40 mg by mouth daily.    . simvastatin (ZOCOR) 5 MG tablet Take 5 mg by mouth every evening.     [name] 20 MG TABS tablet TAKE 1 TABLET (20 MG TOTAL) BY MOUTH DAILY WITH SUPPER. 30 tablet 5   No current facility-administered medications for this visit.     Allergies:   Tape    Social History:  The patient  reports that she has quit smoking. She has never  used smokeless tobacco. She reports that she does not drink alcohol or use drugs.   Family History:  The patient's family history includes Heart disease in her mother; Lung cancer in her father. her mother had valvular heart disease age 88.   ROS:  Please see the history of present illness.   Otherwise, review of systems are positive for none.   All other systems are reviewed and negative.    PHYSICAL EXAM: VS:  BP 132/60   Pulse 72   Ht 5\' 3"  (1.6 m)   Wt 149 lb (67.6 kg) Comment: verbal weight  BMI 26.39 kg/m  , BMI Body mass index is 26.39 kg/m. GEN: Well nourished, well developed, in no acute distress HEENT: normal Neck: no JVD, carotid bruits, or masses Cardiac: RRR; 1/6 systolic right upper sternal border murmur, no rubs, or gallops,no edema  Respiratory:  clear to auscultation bilaterally, normal work of breathing GI: soft, nontender, nondistended, + BS MS: no deformity or atrophy Skin: warm and dry, no rash, trace ankle edema at end of day Neuro:  Strength and sensation are intact Psych: euthymic mood, full affect   EKG:  EKG is ordered today. The ekg ordered today demonstrates 04/04/15-sinus bradycardia rate 59 with old septal infarct pattern, poor R-wave progression area no significant change from prior EKG.   Recent Labs: 01/22/2017: TSH 1.276 01/23/2017: ALT 11 01/24/2017: Hemoglobin 12.5; Platelets 204 01/25/2017: BUN 9; Creatinine, Ser 0.68; Potassium 3.7; Sodium 142    Lipid Panel No results found for: CHOL, TRIG, HDL, CHOLHDL, VLDL, LDLCALC, LDLDIRECT   LDL on 05/28/14 was 70, HDL 63, triglycerides 76    Wt Readings from Last 3 Encounters:  03/22/17 149 lb (67.6 kg)  01/25/17 148 lb 8 oz (67.4 kg)  01/06/16 149 lb 9.6 oz (67.9 kg)      Other studies Reviewed: Additional studies/ records that were reviewed today include: Prior office records from [place] reviewed, lab work reviewed, stress test reviewed.. Review of the above records demonstrates: As  above   ASSESSMENT AND PLAN:  1. Paroxysmal atrial fibrillation-she is currently on Xarelto 15 mg once a day. This was dose adjusted because of decreased creatinine clearance at one point. We will continue. She has not had any bleeding issues. I have discontinued her aspirin 81 mg since she is taking Xarelto to help reduce overall bleeding risks for the future. She is asymptomatic with her atrial fibrillation. Continue with current medications. She did think that an urgent care doctor told her that she was in atrial fibrillation after a weekend drinking binge with vodka. Overall asymptomatic.She thinks once again that she may have had atrial fibrillation with heart rate in the 120s after binge drinking.  2.  Chronic anticoagulation-Xarelto 15 mg once a day. Every 6 months she should have a CBC and creatinine checked.These have been stable.  3. Coronary artery disease-prior LAD and RCA stent placed in 2006.  Prior nuclear stress test 2012 reassuring with no ischemia. No anginal symptoms. Stable.  4. Essential hypertension-She has to go back on her full dose Toprol 25 mg. This helps her with her blood pressure. I'm fine with this.  5. Former smoking-she has quit in 2015. She is maintaining tobacco cessation.  6. Hyperlipidemia-continue with low-dose simvastatin. Could not tolerate higher doses..   Current medicines are reviewed at length with the patient today.  The patient does not have concerns regarding medicines.  The following changes have been made:  Aspirin has been stopped.  Labs/ tests ordered today include:  No orders of the defined types were placed in this encounter.    Disposition:   Six-month follow-up. Signed, Donato Schultz, MD  03/22/2017 5:43 PM    [facility] [address] [place], [place], [place]  [address] Phone: [phone]; Fax: [phone]

## 2017-03-22 NOTE — Patient Instructions (Signed)
Medication Instructions:  Please restart Metoprolol 25 mg a day. Continue all other medicatons as listed.  Follow-Up: Follow up in 6 months with Dr. Anne Fu.  You will receive a letter in the mail 2 months before you are due.  Please call us when you receive this letter to schedule your follow up appointment.  If you need a refill on your cardiac medications before your next appointment, please call your pharmacy.  Thank you for choosing [facility]!!

## 2017-03-30 ENCOUNTER — Encounter: Payer: Self-pay | Admitting: Adult Health

## 2017-03-30 ENCOUNTER — Ambulatory Visit (INDEPENDENT_AMBULATORY_CARE_PROVIDER_SITE_OTHER): Payer: Medicare PPO | Admitting: Adult Health

## 2017-03-30 VITALS — BP 101/66 | HR 73 | Ht 63.0 in | Wt 154.2 lb

## 2017-03-30 DIAGNOSIS — M545 Low back pain, unspecified: Secondary | ICD-10-CM | POA: Insufficient documentation

## 2017-03-30 DIAGNOSIS — G8929 Other chronic pain: Secondary | ICD-10-CM | POA: Diagnosis not present

## 2017-03-30 DIAGNOSIS — Z7901 Long term (current) use of anticoagulants: Secondary | ICD-10-CM | POA: Diagnosis not present

## 2017-03-30 DIAGNOSIS — F339 Major depressive disorder, recurrent, unspecified: Secondary | ICD-10-CM | POA: Diagnosis not present

## 2017-03-30 DIAGNOSIS — I10 Essential (primary) hypertension: Secondary | ICD-10-CM | POA: Diagnosis not present

## 2017-03-30 DIAGNOSIS — E785 Hyperlipidemia, unspecified: Secondary | ICD-10-CM

## 2017-03-30 NOTE — Assessment & Plan Note (Signed)
Heart Healthy Diet Will re-check lipid panel in 3 months.

## 2017-03-30 NOTE — Assessment & Plan Note (Signed)
Last Cards visit 02/2017 Continue Rivarobaxan 20mg  daily.

## 2017-03-30 NOTE — Patient Instructions (Addendum)
Hypertension Hypertension, commonly called high blood pressure, is when the force of blood pumping through the arteries is too strong. The arteries are the blood vessels that carry blood from the heart throughout the body. Hypertension forces the heart to work harder to pump blood and may cause arteries to become narrow or stiff. Having untreated or uncontrolled hypertension can cause heart attacks, strokes, kidney disease, and other problems. A blood pressure reading consists of a higher number over a lower number. Ideally, your blood pressure should be below 120/80. The first ("top") number is called the systolic pressure. It is a measure of the pressure in your arteries as your heart beats. The second ("bottom") number is called the diastolic pressure. It is a measure of the pressure in your arteries as the heart relaxes. What are the causes? The cause of this condition is not known. What increases the risk? Some risk factors for high blood pressure are under your control. Others are not. Factors you can change   Smoking.  Having type 2 diabetes mellitus, high cholesterol, or both.  Not getting enough exercise or physical activity.  Being overweight.  Having too much fat, sugar, calories, or salt (sodium) in your diet.  Drinking too much alcohol. Factors that are difficult or impossible to change   Having chronic kidney disease.  Having a family history of high blood pressure.  Age. Risk increases with age.  Race. You may be at higher risk if you are African-American.  Gender. Men are at higher risk than women before age 45. After age 65, women are at higher risk than men.  Having obstructive sleep apnea.  Stress. What are the signs or symptoms? Extremely high blood pressure (hypertensive crisis) may cause:  Headache.  Anxiety.  Shortness of breath.  Nosebleed.  Nausea and vomiting.  Severe chest pain.  Jerky movements you cannot control (seizures). How is this  diagnosed? This condition is diagnosed by measuring your blood pressure while you are seated, with your arm resting on a surface. The cuff of the blood pressure monitor will be placed directly against the skin of your upper arm at the level of your heart. It should be measured at least twice using the same arm. Certain conditions can cause a difference in blood pressure between your right and left arms. Certain factors can cause blood pressure readings to be lower or higher than normal (elevated) for a short period of time:  When your blood pressure is higher when you are in a health care provider's office than when you are at home, this is called white coat hypertension. Most people with this condition do not need medicines.  When your blood pressure is higher at home than when you are in a health care provider's office, this is called masked hypertension. Most people with this condition may need medicines to control blood pressure. If you have a high blood pressure reading during one visit or you have normal blood pressure with other risk factors:  You may be asked to return on a different day to have your blood pressure checked again.  You may be asked to monitor your blood pressure at home for 1 week or longer. If you are diagnosed with hypertension, you may have other blood or imaging tests to help your health care provider understand your overall risk for other conditions. How is this treated? This condition is treated by making healthy lifestyle changes, such as eating healthy foods, exercising more, and reducing your alcohol intake. Your health   care provider may prescribe medicine if lifestyle changes are not enough to get your blood pressure under control, and if:  Your systolic blood pressure is above 130.  Your diastolic blood pressure is above 80. Your personal target blood pressure may vary depending on your medical conditions, your age, and other factors. Follow these instructions  at home: Eating and drinking   Eat a diet that is high in fiber and potassium, and low in sodium, added sugar, and fat. An example eating plan is called the DASH (Dietary Approaches to Stop Hypertension) diet. To eat this way:  Eat plenty of fresh fruits and vegetables. Try to fill half of your plate at each meal with fruits and vegetables.  Eat whole grains, such as whole wheat pasta, brown rice, or whole grain bread. Fill about one quarter of your plate with whole grains.  Eat or drink low-fat dairy products, such as skim milk or low-fat yogurt.  Avoid fatty cuts of meat, processed or cured meats, and poultry with skin. Fill about one quarter of your plate with lean proteins, such as fish, chicken without skin, beans, eggs, and tofu.  Avoid premade and processed foods. These tend to be higher in sodium, added sugar, and fat.  Reduce your daily sodium intake. Most people with hypertension should eat less than 1,500 mg of sodium a day.  Limit alcohol intake to no more than 1 drink a day for nonpregnant women and 2 drinks a day for men. One drink equals 12 oz of beer, 5 oz of wine, or 1 oz of hard liquor. Lifestyle   Work with your health care provider to maintain a healthy body weight or to lose weight. Ask what an ideal weight is for you.  Get at least 30 minutes of exercise that causes your heart to beat faster (aerobic exercise) most days of the week. Activities may include walking, swimming, or biking.  Include exercise to strengthen your muscles (resistance exercise), such as pilates or lifting weights, as part of your weekly exercise routine. Try to do these types of exercises for 30 minutes at least 3 days a week.  Do not use any products that contain nicotine or tobacco, such as cigarettes and e-cigarettes. If you need help quitting, ask your health care provider.  Monitor your blood pressure at home as told by your health care provider.  Keep all follow-up visits as told by  your health care provider. This is important. Medicines   Take over-the-counter and prescription medicines only as told by your health care provider. Follow directions carefully. Blood pressure medicines must be taken as prescribed.  Do not skip doses of blood pressure medicine. Doing this puts you at risk for problems and can make the medicine less effective.  Ask your health care provider about side effects or reactions to medicines that you should watch for. Contact a health care provider if:  You think you are having a reaction to a medicine you are taking.  You have headaches that keep coming back (recurring).  You feel dizzy.  You have swelling in your ankles.  You have trouble with your vision. Get help right away if:  You develop a severe headache or confusion.  You have unusual weakness or numbness.  You feel faint.  You have severe pain in your chest or abdomen.  You vomit repeatedly.  You have trouble breathing. Summary  Hypertension is when the force of blood pumping through your arteries is too strong. If this condition is   not controlled, it may put you at risk for serious complications.  Your personal target blood pressure may vary depending on your medical conditions, your age, and other factors. For most people, a normal blood pressure is less than 120/80.  Hypertension is treated with lifestyle changes, medicines, or a combination of both. Lifestyle changes include weight loss, eating a healthy, low-sodium diet, exercising more, and limiting alcohol. This information is not intended to replace advice given to you by your health care provider. Make sure you discuss any questions you have with your health care provider. Document Released: 12/13/2005 Document Revised: 11/10/2016 Document Reviewed: 11/10/2016 Elsevier Interactive Patient Education  2017 [employer]    Heart-Healthy Eating Plan Many factors influence your heart health, including eating and  exercise habits. Heart (coronary) risk increases with abnormal blood fat (lipid) levels. Heart-healthy meal planning includes limiting unhealthy fats, increasing healthy fats, and making other small dietary changes. This includes maintaining a healthy body weight to help keep lipid levels within a normal range. What is my plan? Your health care provider recommends that you:  Get no more than _________% of the total calories in your daily diet from fat.  Limit your intake of saturated fat to less than _________% of your total calories each day.  Limit the amount of cholesterol in your diet to less than _________ mg per day. What types of fat should I choose?  Choose healthy fats more often. Choose monounsaturated and polyunsaturated fats, such as olive oil and canola oil, flaxseeds, walnuts, almonds, and seeds.  Eat more omega-3 fats. Good choices include salmon, mackerel, sardines, tuna, flaxseed oil, and ground flaxseeds. Aim to eat fish at least two times each week.  Limit saturated fats. Saturated fats are primarily found in animal products, such as meats, butter, and cream. Plant sources of saturated fats include palm oil, palm kernel oil, and coconut oil.  Avoid foods with partially hydrogenated oils in them. These contain trans fats. Examples of foods that contain trans fats are stick margarine, some tub margarines, cookies, crackers, and other baked goods. What general guidelines do I need to follow?  Check food labels carefully to identify foods with trans fats or high amounts of saturated fat.  Fill one half of your plate with vegetables and green salads. Eat 4-5 servings of vegetables per day. A serving of vegetables equals 1 cup of raw leafy vegetables,  cup of raw or cooked cut-up vegetables, or  cup of vegetable juice.  Fill one fourth of your plate with whole grains. Look for the word "whole" as the first word in the ingredient list.  Fill one fourth of your plate with lean  protein foods.  Eat 4-5 servings of fruit per day. A serving of fruit equals one medium whole fruit,  cup of dried fruit,  cup of fresh, frozen, or canned fruit, or  cup of 100% fruit juice.  Eat more foods that contain soluble fiber. Examples of foods that contain this type of fiber are apples, broccoli, carrots, beans, peas, and barley. Aim to get 20-30 g of fiber per day.  Eat more home-cooked food and less restaurant, buffet, and fast food.  Limit or avoid alcohol.  Limit foods that are high in starch and sugar.  Avoid fried foods.  Cook foods by using methods other than frying. Baking, boiling, grilling, and broiling are all great options. Other fat-reducing suggestions include:  Removing the skin from poultry.  Removing all visible fats from meats.  Skimming the fat  off of stews, soups, and gravies before serving them.  Steaming vegetables in water or broth.  Lose weight if you are overweight. Losing just 5-10% of your initial body weight can help your overall health and prevent diseases such as diabetes and heart disease.  Increase your consumption of nuts, legumes, and seeds to 4-5 servings per week. One serving of dried beans or legumes equals  cup after being cooked, one serving of nuts equals 1 ounces, and one serving of seeds equals  ounce or 1 tablespoon.  You may need to monitor your salt (sodium) intake, especially if you have high blood pressure. Talk with your health care provider or dietitian to get more information about reducing sodium. What foods can I eat? Grains   Breads, including [place], white, pita, wheat, raisin, rye, oatmeal, and [place]. Tortillas that are neither fried nor made with lard or trans fat. Low-fat rolls, including hotdog and hamburger buns and English muffins. Biscuits. Muffins. Waffles. Pancakes. Light popcorn. Whole-grain cereals. Flatbread. Melba toast. Pretzels. Breadsticks. Rusks. Low-fat snacks and crackers, including oyster,  saltine, matzo, graham, animal, and rye. Rice and pasta, including brown rice and those that are made with whole wheat. Vegetables  All vegetables. Fruits  All fruits, but limit coconut. Meats and Other Protein Sources  Lean, well-trimmed beef, veal, pork, and lamb. Chicken and [place] without skin. All fish and shellfish. Wild duck, rabbit, pheasant, and venison. Egg whites or low-cholesterol egg substitutes. Dried beans, peas, lentils, and tofu.Seeds and most nuts. Dairy  Low-fat or nonfat cheeses, including ricotta, string, and mozzarella. Skim or 1% milk that is liquid, powdered, or evaporated. Buttermilk that is made with low-fat milk. Nonfat or low-fat yogurt. Beverages  Mineral water. Diet carbonated beverages. Sweets and Desserts  Sherbets and fruit ices. Honey, jam, marmalade, jelly, and syrups. Meringues and gelatins. Pure sugar candy, such as hard candy, jelly beans, gumdrops, mints, marshmallows, and small amounts of dark chocolate. [employer]. Eat all sweets and desserts in moderation. Fats and Oils  Nonhydrogenated (trans-free) margarines. Vegetable oils, including soybean, sesame, sunflower, olive, peanut, safflower, corn, canola, and cottonseed. Salad dressings or mayonnaise that are made with a vegetable oil. Limit added fats and oils that you use for cooking, baking, salads, and as spreads. Other  Cocoa powder. Coffee and tea. All seasonings and condiments. The items listed above may not be a complete list of recommended foods or beverages. Contact your dietitian for more options.  What foods are not recommended? Grains  Breads that are made with saturated or trans fats, oils, or whole milk. Croissants. Butter rolls. Cheese breads. Sweet rolls. Donuts. Buttered popcorn. Chow mein noodles. High-fat crackers, such as cheese or butter crackers. Meats and Other Protein Sources  Fatty meats, such as hotdogs, short ribs, sausage, spareribs, bacon, ribeye roast or steak, and  mutton. High-fat deli meats, such as salami and bologna. Caviar. Domestic duck and goose. Organ meats, such as kidney, liver, sweetbreads, brains, gizzard, chitterlings, and heart. Dairy  Cream, sour cream, cream cheese, and creamed cottage cheese. Whole milk cheeses, including blue (bleu), [address], [place], [place], [address], [place], [address], [place], [place], and [place]. Whole or 2% milk that is liquid, evaporated, or condensed. Whole buttermilk. Cream sauce or high-fat cheese sauce. Yogurt that is made from whole milk. Beverages  Regular sodas and drinks with added sugar. Sweets and Desserts  Frosting. Pudding. Cookies. Cakes other than angel food cake. Candy that has milk chocolate or white chocolate, hydrogenated fat, butter, coconut, or unknown ingredients.  Buttered syrups. Full-fat ice cream or ice cream drinks. Fats and Oils  Gravy that has suet, meat fat, or shortening. Cocoa butter, hydrogenated oils, palm oil, coconut oil, palm kernel oil. These can often be found in baked products, candy, fried foods, nondairy creamers, and whipped toppings. Solid fats and shortenings, including bacon fat, salt pork, lard, and butter. Nondairy cream substitutes, such as coffee creamers and sour cream substitutes. Salad dressings that are made of unknown oils, cheese, or sour cream. The items listed above may not be a complete list of foods and beverages to avoid. Contact your dietitian for more information.  This information is not intended to replace advice given to you by your health care provider. Make sure you discuss any questions you have with your health care provider. Document Released: 09/21/2008 Document Revised: 07/02/2016 Document Reviewed: 06/06/2014 Elsevier Interactive Patient Education  2017 [employer].  Please continue all medications as directed, please call when refills are needed. Reduce Vodka consumption, recommend max one drink daily. Behavorial Health referral placed. Please  follow up in 75months, sooner if needed. We will check cholesterol levels at your next appt.

## 2017-03-30 NOTE — Assessment & Plan Note (Signed)
Daily seated stretching.

## 2017-03-30 NOTE — Assessment & Plan Note (Signed)
Continue paroxetine 40mg  daily-has been taking for years. Reduce vodka consumption-recommend max 1 drink daily. Behavorial Health referral placed. Recommend to start a hobby/activity, establish new friendships.

## 2017-03-30 NOTE — Progress Notes (Signed)
Subjective:    Patient ID: Christina Ortiz, female    DOB: 1929/02/26, 83 y.o.   MRN: [id number]  HPI:  Christina Ortiz presents to establish as a new pt.  She is a very pleasant 83 year old female.  PMH:  CAD, HTN, Hyperlipidemia, chronic anticoagulation, lumbago, hypothyroidism, and heavy alcohol use.  She reports medication compliance, however feels " febil and slow".  She recently lost her husband two months ago, they were married for > 67 years.  She reports her depression worsened for a few weeks after his passing, but is improving now.  She is open to behavioral health referral today-GREAT.  She recently saw her Cardiologist last week and only changes were to d/c ASA and increase Metoprolol dose to 25mg  daily.  She denies tobacco use.  She reports drinking 10 cocktails a week that consist of  "vodka with ice".  She denies ETOH use interfering with personal relationships or causing problems in her daily life.  She denies ever going through ETOH rehab.  She has support system of few local friends and her daughter "who lives nearby".  She reports lumbago pain and "feeling slow" from her resuming golf practice (she was avid golfer in [place] with her husband). She was hospitalized Jan 2018 for Influenza.  Patient Care Team    Relationship Specialty Notifications Start End  Malon Kindle, NP PCP - General Family Medicine  03/30/17   Jake Bathe, MD Consulting Physician Cardiology  03/30/17   Mateo Flow, MD Consulting Physician Ophthalmology  03/30/17   Mateo Flow, MD Consulting Physician Ophthalmology  03/30/17 03/30/17  [facility]    03/30/17     Patient Active Problem List   Diagnosis Date Noted  . Depression, recurrent (HCC) 03/30/2017  . Lumbago 03/30/2017  . Influenza with respiratory manifestation 01/22/2017  . Coronary artery disease due to lipid rich plaque 04/04/2015  . Essential hypertension 04/04/2015  . Hyperlipidemia 04/04/2015  . Former smoker 04/04/2015   . Chronic anticoagulation 04/04/2015     Past Medical History:  Diagnosis Date  . Arthritis   . CKD (chronic kidney disease)    Xarelto dose is 15 mg QD  . Coronary artery disease    a. s/p PCI/stenting to RCA and LAD ([facility] in [place]) in 2006; b. Nuc 1/15: No ischemia, EF 70%  . Depression   . Gastritis   . History of cholecystectomy   . History of echocardiogram    a. Echo 1/15: Moderate LVH, EF 55-60%, impaired relaxation, mild AS  . Hyperlipidemia   . Hypertension   . Mobitz type 1 second degree atrioventricular block    a. Event Monitor 3/15: NSR, first-degree AV block, second-degree AB block - Mobitz 1, PACs, NSVT (5 beats)  . PAF (paroxysmal atrial fibrillation) (HCC)   . Thyroid disease      Past Surgical History:  Procedure Laterality Date  . BREAST LUMPECTOMY Left   . CARDIAC CATHETERIZATION    . CATARACT EXTRACTION Bilateral   . CHOLECYSTECTOMY    . DILATION AND CURETTAGE OF UTERUS    . EYE SURGERY       Family History  Problem Relation Age of Onset  . Heart disease Mother   . Heart attack Mother   . Lung cancer Father   . Cancer Father     lung     History  Drug Use No     History  Alcohol Use  . 6.0 oz/week  . 10  Shots of liquor per week     History  Smoking Status  . Former Smoker  . Packs/day: 0.50  . Years: 67.00  . Types: Cigarettes  . Quit date: 10/25/2015  Smokeless Tobacco  . Never Used    Comment: quit 2015     Outpatient Encounter Prescriptions as of 03/30/2017  Medication Sig  . amLODipine (NORVASC) 10 MG tablet Take 1 tablet (10 mg total) by mouth daily.  Marland Kitchen doxazosin (CARDURA) 8 MG tablet Take 8 mg by mouth daily.  Marland Kitchen levothyroxine (SYNTHROID, LEVOTHROID) 75 MCG tablet Take 75 mcg by mouth daily before breakfast.  . metoprolol succinate (TOPROL-XL) 25 MG 24 hr tablet Take 1 tablet (25 mg total) by mouth daily.  Marland Kitchen omeprazole (PRILOSEC) 20 MG capsule Take 20 mg by mouth daily as needed (heartburn).   Marland Kitchen  PARoxetine (PAXIL) 40 MG tablet Take 40 mg by mouth every morning.  . potassium chloride (KLOR-CON) 8 MEQ tablet Take 8 mEq by mouth daily.  . quinapril (ACCUPRIL) 40 MG tablet Take 40 mg by mouth daily.  . simvastatin (ZOCOR) 5 MG tablet Take 5 mg by mouth every evening.   [name] 20 MG TABS tablet TAKE 1 TABLET (20 MG TOTAL) BY MOUTH DAILY WITH SUPPER.  . [DISCONTINUED] oseltamivir (TAMIFLU) 30 MG capsule Take 1 capsule (30 mg total) by mouth 2 (two) times daily.   No facility-administered encounter medications on file as of 03/30/2017.     Allergies: Tape  Body mass index is 27.32 kg/m.  Blood pressure 101/66, pulse 73, height 5\' 3"  (1.6 m), weight 154 lb 3.2 oz (69.9 kg).   Review of Systems  Constitutional: Positive for fatigue. Negative for activity change, appetite change, chills, diaphoresis, fever and unexpected weight change.  Eyes: Negative for visual disturbance.  Respiratory: Negative for cough, chest tightness, shortness of breath, wheezing and stridor.   Cardiovascular: Positive for leg swelling. Negative for chest pain and palpitations.  Gastrointestinal: Positive for diarrhea. Negative for abdominal distention, abdominal pain, blood in stool, constipation, nausea and vomiting.  Endocrine: Negative for cold intolerance, heat intolerance, polydipsia, polyphagia and polyuria.  Genitourinary: Positive for frequency and urgency. Negative for difficulty urinating and flank pain.  Musculoskeletal: Positive for arthralgias, back pain, gait problem, joint swelling, myalgias, neck pain and neck stiffness.  Skin: Negative for color change, pallor, rash and wound.  Neurological: Negative for dizziness, tremors, syncope, speech difficulty, weakness, light-headedness, numbness and headaches.  Hematological: Does not bruise/bleed easily.  Psychiatric/Behavioral: Positive for dysphoric mood. Negative for agitation, behavioral problems, confusion, decreased concentration,  hallucinations, self-injury, sleep disturbance and suicidal ideas. The patient is not nervous/anxious and is not hyperactive.        Objective:   Physical Exam  Constitutional: She is oriented to person, place, and time. She appears well-developed and well-nourished. No distress.  HENT:  Head: Normocephalic and atraumatic.  Right Ear: Tympanic membrane and external ear normal. Decreased hearing is noted.  Left Ear: Tympanic membrane and external ear normal. Decreased hearing is noted.  Nose: Nose normal. Right sinus exhibits no maxillary sinus tenderness and no frontal sinus tenderness. Left sinus exhibits no maxillary sinus tenderness and no frontal sinus tenderness.  Mouth/Throat: Uvula is midline.  Eyes: Conjunctivae are normal. Pupils are equal, round, and reactive to light.  Neck: Normal range of motion.  Cardiovascular: Normal rate, regular rhythm and intact distal pulses.   Murmur heard. Pulmonary/Chest: Effort normal and breath sounds normal. No respiratory distress. She has no wheezes. She has no  rales. She exhibits no tenderness.  Abdominal: Soft. Bowel sounds are normal. She exhibits no distension and no mass. There is no tenderness. There is no rebound and no guarding.  Musculoskeletal: She exhibits edema and tenderness.       Right ankle: She exhibits swelling.       Left ankle: She exhibits swelling.       Lumbar back: She exhibits decreased range of motion and tenderness.       Right hand: Normal strength noted.       Left hand: Normal strength noted.  Bil OA noted on hands/fingers. Slow, steady but guarded ambulation.  Lymphadenopathy:    She has no cervical adenopathy.  Neurological: She is alert and oriented to person, place, and time. Coordination normal.  Skin: Skin is warm and dry. No rash noted. She is not diaphoretic. No erythema. No pallor.  Psychiatric: She has a normal mood and affect. Her behavior is normal. Judgment and thought content normal.  Nursing note  and vitals reviewed.         Assessment & Plan:   1. Depression, recurrent (HCC)   2. Essential hypertension   3. Hyperlipidemia, unspecified hyperlipidemia type   4. Chronic anticoagulation   5. Chronic bilateral low back pain, with sciatica presence unspecified     Lumbago Daily seated stretching.  Essential hypertension Last Cards visit was 02/2017. Continue amlodipine 10mg , doxazosin 8mg , metoprolo 25mg  daily.   Hyperlipidemia Heart Healthy Diet Will re-check lipid panel in 3 months.   Chronic anticoagulation Last Cards visit 02/2017 Continue Rivarobaxan 20mg  daily.  Depression, recurrent (HCC) Continue paroxetine 40mg  daily-has been taking for years. Reduce vodka consumption-recommend max 1 drink daily. Behavorial Health referral placed. Recommend to start a hobby/activity, establish new friendships.    FOLLOW-UP:  Return in about 3 months (around 06/29/2017) for Regular Follow Up, HTN, Depression.

## 2017-03-30 NOTE — Assessment & Plan Note (Signed)
Last Cards visit was 02/2017. Continue amlodipine 10mg , doxazosin 8mg , metoprolo 25mg  daily.

## 2017-04-12 ENCOUNTER — Ambulatory Visit (HOSPITAL_COMMUNITY)
Admission: EM | Admit: 2017-04-12 | Discharge: 2017-04-12 | Disposition: A | Discharge status: Home / Self Care | Payer: Medicare PPO | Attending: Internal Medicine | Admitting: Internal Medicine

## 2017-04-12 ENCOUNTER — Encounter (HOSPITAL_COMMUNITY): Payer: Self-pay | Admitting: Emergency Medicine

## 2017-04-12 DIAGNOSIS — E876 Hypokalemia: Secondary | ICD-10-CM

## 2017-04-12 DIAGNOSIS — F1010 Alcohol abuse, uncomplicated: Secondary | ICD-10-CM

## 2017-04-12 DIAGNOSIS — R531 Weakness: Secondary | ICD-10-CM | POA: Diagnosis not present

## 2017-04-12 DIAGNOSIS — R5383 Other fatigue: Secondary | ICD-10-CM | POA: Diagnosis not present

## 2017-04-12 LAB — POCT URINALYSIS DIP (DEVICE)
GLUCOSE, UA: NEGATIVE mg/dL
KETONES UR: NEGATIVE mg/dL
Nitrite: NEGATIVE
PH: 7 (ref 5.0–8.0)
PROTEIN: 30 mg/dL — AB
SPECIFIC GRAVITY, URINE: 1.015 (ref 1.005–1.030)
UROBILINOGEN UA: 1 mg/dL (ref 0.0–1.0)

## 2017-04-12 LAB — POCT I-STAT, CHEM 8
BUN: 6 mg/dL (ref 6–20)
CALCIUM ION: 1.05 mmol/L — AB (ref 1.15–1.40)
CHLORIDE: 100 mmol/L — AB (ref 101–111)
CREATININE: 0.8 mg/dL (ref 0.44–1.00)
GLUCOSE: 115 mg/dL — AB (ref 65–99)
HCT: 46 % (ref 36.0–46.0)
Hemoglobin: 15.6 g/dL — ABNORMAL HIGH (ref 12.0–15.0)
POTASSIUM: 2.9 mmol/L — AB (ref 3.5–5.1)
Sodium: 141 mmol/L (ref 135–145)
TCO2: 28 mmol/L (ref 0–100)

## 2017-04-12 MED ORDER — POTASSIUM CHLORIDE CRYS ER 20 MEQ PO TBCR
EXTENDED_RELEASE_TABLET | ORAL | Status: AC
  Filled 2017-04-12: qty 2

## 2017-04-12 MED ORDER — POTASSIUM CHLORIDE CRYS ER 20 MEQ PO TBCR
40.0000 meq | EXTENDED_RELEASE_TABLET | Freq: Once | ORAL | Status: AC
Start: 2017-04-12 — End: 2017-04-12
  Administered 2017-04-12: 40 meq via ORAL

## 2017-04-12 NOTE — ED Triage Notes (Signed)
Weakness, denies cough and cold.  Intermittent diarrhea.  Denies pain with urination.  Denies chest pain  Hospitalized 2 months ago with the flu, also had low potassium

## 2017-04-12 NOTE — Discharge Instructions (Signed)
Continue to drink plenty of fluids. For now double up on your potassium . Please do not drink any more alcohol. Follow-up with your primary care provider this week if possible to obtain additional lab work and repeat your potassium. If you develop additional symptoms, fever, increased weakness, chest pain, vomiting, fever, shortness of breath or other problems go to the emergency department.

## 2017-04-12 NOTE — ED Provider Notes (Signed)
CSN: [id number]     Arrival date & time 04/12/17  1045 History   First MD Initiated Contact with Patient 04/12/17 1107     Chief Complaint  Patient presents with  . Weakness   (Consider location/radiation/quality/duration/timing/severity/associated sxs/prior Treatment) 83 year old female presents to the urgent care with a chief complaint of feeling weak for about 4 days. When asked if she could be more specific as to what reason she may have for being weak she volunteered the fact that last week he she drank a bottle of vodka and prior to that she also had vodka when she went out of state to [place]. Her only other physical complaint is that of occasional diarrhea. She denies headache, but she does have some lightheadedness when ambulating. Denies shortness of breath, chest pain, abdominal pain, nausea or vomiting, GU symptoms, upper respiratory symptoms or focal paresthesias or weakness. Denies pain anywhere. Past medical history is significant for CK D, CAD, depression, gastritis, hyperlipidemia, hypertension, Mobitz type I second-degree AV block, hypothyroidism and PAF.  Patient's daughter came in a little later after blood was drawn and stated that her mother gets like this every time she drinks alcohol. She has a period of about 4 days in which she feels weak and fatigued.      Past Medical History:  Diagnosis Date  . Arthritis   . CKD (chronic kidney disease)    Xarelto dose is 15 mg QD  . Coronary artery disease    a. s/p PCI/stenting to RCA and LAD ([facility] in [place]) in 2006; b. Nuc 1/15: No ischemia, EF 70%  . Depression   . Gastritis   . History of cholecystectomy   . History of echocardiogram    a. Echo 1/15: Moderate LVH, EF 55-60%, impaired relaxation, mild AS  . Hyperlipidemia   . Hypertension   . Mobitz type 1 second degree atrioventricular block    a. Event Monitor 3/15: NSR, first-degree AV block, second-degree AB block - Mobitz 1, PACs, NSVT (5 beats)  .  PAF (paroxysmal atrial fibrillation) (HCC)   . Thyroid disease    Past Surgical History:  Procedure Laterality Date  . BREAST LUMPECTOMY Left   . CARDIAC CATHETERIZATION    . CATARACT EXTRACTION Bilateral   . CHOLECYSTECTOMY    . DILATION AND CURETTAGE OF UTERUS    . EYE SURGERY     Family History  Problem Relation Age of Onset  . Heart disease Mother   . Heart attack Mother   . Lung cancer Father   . Cancer Father     lung   Social History  Substance Use Topics  . Smoking status: Former Smoker    Packs/day: 0.50    Years: 67.00    Types: Cigarettes    Quit date: 10/25/2015  . Smokeless tobacco: Never Used     Comment: quit 2015  . Alcohol use 6.0 oz/week    10 Shots of liquor per week   OB History    No data available     Review of Systems  Constitutional: Positive for activity change. Negative for fever.  HENT: Negative.   Eyes: Negative.   Respiratory: Negative.   Cardiovascular: Negative for chest pain and leg swelling.       Occasional palpitations which are chronic and related to her atrial fibrillation per her testimony.  Gastrointestinal: Negative.  Negative for abdominal pain, nausea and vomiting.  Genitourinary: Negative.   Skin: Negative.   Neurological: Negative for seizures, syncope and  headaches.  All other systems reviewed and are negative.   Allergies  Tape  Home Medications   Prior to Admission medications   Medication Sig Start Date End Date Taking? Authorizing Provider  amLODipine (NORVASC) 10 MG tablet Take 1 tablet (10 mg total) by mouth daily. 11/06/15   Jake Bathe, MD  doxazosin (CARDURA) 8 MG tablet Take 8 mg by mouth daily.    Historical Provider, MD  levothyroxine (SYNTHROID, LEVOTHROID) 75 MCG tablet Take 75 mcg by mouth daily before breakfast.    Historical Provider, MD  metoprolol succinate (TOPROL-XL) 25 MG 24 hr tablet Take 1 tablet (25 mg total) by mouth daily. 03/22/17   Jake Bathe, MD  omeprazole (PRILOSEC) 20 MG  capsule Take 20 mg by mouth daily as needed (heartburn).     Historical Provider, MD  PARoxetine (PAXIL) 40 MG tablet Take 40 mg by mouth every morning.    Historical Provider, MD  potassium chloride (KLOR-CON) 8 MEQ tablet Take 8 mEq by mouth daily.    Historical Provider, MD  quinapril (ACCUPRIL) 40 MG tablet Take 40 mg by mouth daily. 01/23/15   Historical Provider, MD  simvastatin (ZOCOR) 5 MG tablet Take 5 mg by mouth every evening.  01/28/15   Historical Provider, MD  XARELTO 20 MG TABS tablet TAKE 1 TABLET (20 MG TOTAL) BY MOUTH DAILY WITH SUPPER. 01/14/17   Beatrice Lecher, PA-C   Meds Ordered and Administered this Visit   Medications  potassium chloride SA (K-DUR,KLOR-CON) CR tablet 40 mEq (40 mEq Oral Given 04/12/17 1229)    BP 117/90 (BP Location: Left Arm)   Pulse 90   Temp 98.3 F (36.8 C) (Oral)   Resp (!) 22   SpO2 97%  No data found.   Physical Exam  Constitutional: She is oriented to person, place, and time. She appears well-developed and well-nourished. No distress.  HENT:  Mouth/Throat: Oropharynx is clear and moist. No oropharyngeal exudate.  Eyes: EOM are normal.  Neck: Normal range of motion. Neck supple. No thyromegaly present.  Cardiovascular: Normal rate, normal heart sounds and intact distal pulses.   No murmur heard. Occasional to frequent premature beats. Normal rate  Pulmonary/Chest: Effort normal. No respiratory distress. She has no wheezes.  Faint basilar crackles.  Abdominal: Soft. She exhibits no distension. There is no tenderness. There is no guarding.  Musculoskeletal: She exhibits no tenderness or deformity.  Lymphadenopathy:    She has no cervical adenopathy.  Neurological: She is alert and oriented to person, place, and time. She exhibits normal muscle tone.  Skin: Skin is warm and dry. No erythema.  Psychiatric: Her speech is normal and behavior is normal. Thought content normal. She exhibits a depressed mood.  Nursing note and vitals  reviewed.   Urgent Care Course     Procedures (including critical care time)  Labs Review Labs Reviewed  POCT I-STAT, CHEM 8 - Abnormal; Notable for the following:       Result Value   Potassium 2.9 (*)    Chloride 100 (*)    Glucose, Bld 115 (*)    Calcium, Ion 1.05 (*)    Hemoglobin 15.6 (*)    All other components within normal limits  POCT URINALYSIS DIP (DEVICE) - Abnormal; Notable for the following:    Bilirubin Urine SMALL (*)    Hgb urine dipstick TRACE (*)    Protein, ur 30 (*)    Leukocytes, UA TRACE (*)    All other components within normal  limits   Results for orders placed or performed during the hospital encounter of 04/12/17  I-STAT, chem 8  Result Value Ref Range   Sodium 141 135 - 145 mmol/L   Potassium 2.9 (L) 3.5 - 5.1 mmol/L   Chloride 100 (L) 101 - 111 mmol/L   BUN 6 6 - 20 mg/dL   Creatinine, Ser 1.61 0.44 - 1.00 mg/dL   Glucose, Bld 096 (H) 65 - 99 mg/dL   Calcium, Ion 0.45 (L) 1.15 - 1.40 mmol/L   TCO2 28 0 - 100 mmol/L   Hemoglobin 15.6 (H) 12.0 - 15.0 g/dL   HCT 40.9 81.1 - 91.4 %  POCT urinalysis dip (device)  Result Value Ref Range   Glucose, UA NEGATIVE NEGATIVE mg/dL   Bilirubin Urine SMALL (A) NEGATIVE   Ketones, ur NEGATIVE NEGATIVE mg/dL   Specific Gravity, Urine 1.015 1.005 - 1.030   Hgb urine dipstick TRACE (A) NEGATIVE   pH 7.0 5.0 - 8.0   Protein, ur 30 (A) NEGATIVE mg/dL   Urobilinogen, UA 1.0 0.0 - 1.0 mg/dL   Nitrite NEGATIVE NEGATIVE   Leukocytes, UA TRACE (A) NEGATIVE    Imaging Review No results found.   Visual Acuity Review  Right Eye Distance:   Left Eye Distance:   Bilateral Distance:    Right Eye Near:   Left Eye Near:    Bilateral Near:         MDM   1. Generalized weakness   2. Alcohol abuse   3. Hypokalemia    Continue to drink plenty of fluids. For now double up on your potassium . Please do not drink any more alcohol. Follow-up with your primary care provider this week if possible to obtain  additional lab work and repeat your potassium. If you develop additional symptoms, fever, increased weakness, chest pain, vomiting, fever, shortness of breath or other problems go to the emergency department. Meds ordered this encounter  Medications  . potassium chloride SA (K-DUR,KLOR-CON) CR tablet 40 mEq       Hayden Rasmussen, NP 04/12/17 1236    Hayden Rasmussen, NP 04/12/17 [phone]

## 2017-04-18 ENCOUNTER — Other Ambulatory Visit (INDEPENDENT_AMBULATORY_CARE_PROVIDER_SITE_OTHER): Payer: Medicare PPO

## 2017-04-18 DIAGNOSIS — R531 Weakness: Secondary | ICD-10-CM

## 2017-04-19 LAB — CBC WITH DIFFERENTIAL/PLATELET
BASOS: 0 %
Basophils Absolute: 0 10*3/uL (ref 0.0–0.2)
EOS (ABSOLUTE): 0.3 10*3/uL (ref 0.0–0.4)
EOS: 4 %
HEMATOCRIT: 39.8 % (ref 34.0–46.6)
HEMOGLOBIN: 13.1 g/dL (ref 11.1–15.9)
IMMATURE GRANS (ABS): 0 10*3/uL (ref 0.0–0.1)
Immature Granulocytes: 0 %
LYMPHS: 20 %
Lymphocytes Absolute: 1.3 10*3/uL (ref 0.7–3.1)
MCH: 32.1 pg (ref 26.6–33.0)
MCHC: 32.9 g/dL (ref 31.5–35.7)
MCV: 98 fL — AB (ref 79–97)
MONOCYTES: 17 %
Monocytes Absolute: 1.2 10*3/uL — ABNORMAL HIGH (ref 0.1–0.9)
NEUTROS ABS: 4 10*3/uL (ref 1.4–7.0)
Neutrophils: 59 %
Platelets: 198 10*3/uL (ref 150–379)
RBC: 4.08 x10E6/uL (ref 3.77–5.28)
RDW: 14.4 % (ref 12.3–15.4)
WBC: 6.9 10*3/uL (ref 3.4–10.8)

## 2017-04-19 LAB — BASIC METABOLIC PANEL
BUN / CREAT RATIO: 23 (ref 12–28)
BUN: 20 mg/dL (ref 8–27)
CALCIUM: 9.5 mg/dL (ref 8.7–10.3)
CO2: 26 mmol/L (ref 18–29)
CREATININE: 0.87 mg/dL (ref 0.57–1.00)
Chloride: 102 mmol/L (ref 96–106)
GFR, EST AFRICAN AMERICAN: 70 mL/min/{1.73_m2} (ref >59)
GFR, EST NON AFRICAN AMERICAN: 61 mL/min/{1.73_m2} (ref >59)
Glucose: 75 mg/dL (ref 65–99)
Potassium: 3.9 mmol/L (ref 3.5–5.2)
Sodium: 145 mmol/L — ABNORMAL HIGH (ref 134–144)

## 2017-05-02 ENCOUNTER — Other Ambulatory Visit: Payer: Self-pay

## 2017-05-02 ENCOUNTER — Other Ambulatory Visit: Payer: Self-pay | Admitting: Adult Health

## 2017-05-02 DIAGNOSIS — E876 Hypokalemia: Secondary | ICD-10-CM

## 2017-05-02 MED ORDER — POTASSIUM CHLORIDE ER 8 MEQ PO TBCR: 8.0000 meq | EXTENDED_RELEASE_TABLET | Freq: Every day | ORAL | 2 refills | Status: DC

## 2017-05-02 MED ORDER — POTASSIUM CHLORIDE ER 10 MEQ PO TBCR: 10.0000 meq | EXTENDED_RELEASE_TABLET | Freq: Every day | ORAL | 2 refills | Status: DC

## 2017-05-02 NOTE — Telephone Encounter (Signed)
Good Morning Christina Ortiz, I refilled her K+ Rx. I reviewed her note from ED and she needs to schedule a K+ level check ASAP. PLEASE have her make lab appt before she gets off the phone. Please ask her if she [place] having any palpitations, chest pain? Thanks! Orpha Bur

## 2017-05-02 NOTE — Telephone Encounter (Signed)
Morning Christina Ortiz, I was unable to reach Christina Ortiz. Can you please call her and tell her that I d/c'd her K+ 8 mEq.  I just sent in K+10 mEq.   Please encourage her not to consume ETOH (vodka) and have her come in for K+ check in 2 months, sooner if she is having weakness or palpitations.   Thanks! Orpha Bur

## 2017-05-02 NOTE — Telephone Encounter (Signed)
Pt informed. Pt expressed understanding and is agreeable. Genesi Stefanko CMA, RT 

## 2017-05-02 NOTE — Telephone Encounter (Signed)
Pt had labs done on 04/18/17. Pt informed of results. Pt expressed understanding and is agreeable. Liborio Nixon CMA, RT

## 2017-05-02 NOTE — Telephone Encounter (Signed)
Pt's daughter called and stated that when the pt was in the ED they told her to double her potassium medication due to it being real low. Pt's daughter wants to know if the pt should take potassium chloride 8 mEq daily or if she she should take 16 mEq daily? Please advise?

## 2017-05-02 NOTE — Telephone Encounter (Signed)
We have not prescribed these medications for the patient previously.  Please review and refill if appropriate.  T. Nelson, CMA  

## 2017-05-03 ENCOUNTER — Telehealth: Payer: Self-pay | Admitting: Adult Health

## 2017-05-03 NOTE — Telephone Encounter (Signed)
Pt. Called and confirmed that she is taking the new rx of 10 MEQ. Thanks!

## 2017-05-03 NOTE — Telephone Encounter (Signed)
Patient's daughter was speaking with Marchelle Folks yesterday and was told there was a new potassium Rx sent to [facility]. She picked it up for her mother today but the bottle dosage states "8 MEQ", she thought the dosage was going to be increased. I told her that based on our records we sent in a request for 10 MEQ. She wants to speak with someone to make sure what she is giving her mother is correct. I advised her that in addition to sending this message that she should contact the pharmacy to make sure they didn't have an error on their side.

## 2017-05-22 ENCOUNTER — Encounter (HOSPITAL_COMMUNITY): Payer: Self-pay | Admitting: Emergency Medicine

## 2017-05-22 ENCOUNTER — Emergency Department (HOSPITAL_COMMUNITY)
Admission: EM | Admit: 2017-05-22 | Discharge: 2017-05-23 | Disposition: A | Discharge status: Home / Self Care | Payer: Medicare PPO | Attending: Emergency Medicine | Admitting: Emergency Medicine

## 2017-05-22 ENCOUNTER — Emergency Department (HOSPITAL_COMMUNITY): Payer: Medicare PPO

## 2017-05-22 DIAGNOSIS — F1010 Alcohol abuse, uncomplicated: Secondary | ICD-10-CM | POA: Insufficient documentation

## 2017-05-22 DIAGNOSIS — Z79899 Other long term (current) drug therapy: Secondary | ICD-10-CM | POA: Insufficient documentation

## 2017-05-22 DIAGNOSIS — I129 Hypertensive chronic kidney disease with stage 1 through stage 4 chronic kidney disease, or unspecified chronic kidney disease: Secondary | ICD-10-CM | POA: Diagnosis not present

## 2017-05-22 DIAGNOSIS — R531 Weakness: Secondary | ICD-10-CM | POA: Diagnosis present

## 2017-05-22 DIAGNOSIS — N189 Chronic kidney disease, unspecified: Secondary | ICD-10-CM | POA: Insufficient documentation

## 2017-05-22 DIAGNOSIS — I2510 Atherosclerotic heart disease of native coronary artery without angina pectoris: Secondary | ICD-10-CM | POA: Insufficient documentation

## 2017-05-22 DIAGNOSIS — I4891 Unspecified atrial fibrillation: Secondary | ICD-10-CM | POA: Diagnosis not present

## 2017-05-22 DIAGNOSIS — Z87891 Personal history of nicotine dependence: Secondary | ICD-10-CM | POA: Diagnosis not present

## 2017-05-22 DIAGNOSIS — F4321 Adjustment disorder with depressed mood: Secondary | ICD-10-CM

## 2017-05-22 DIAGNOSIS — F4320 Adjustment disorder, unspecified: Secondary | ICD-10-CM | POA: Insufficient documentation

## 2017-05-22 DIAGNOSIS — E876 Hypokalemia: Secondary | ICD-10-CM | POA: Insufficient documentation

## 2017-05-22 LAB — COMPREHENSIVE METABOLIC PANEL
ALT: 15 U/L (ref 14–54)
AST: 66 U/L — AB (ref 15–41)
Albumin: 3.7 g/dL (ref 3.5–5.0)
Alkaline Phosphatase: 89 U/L (ref 38–126)
Anion gap: 10 (ref 5–15)
BILIRUBIN TOTAL: 1 mg/dL (ref 0.3–1.2)
BUN: 10 mg/dL (ref 6–20)
CHLORIDE: 95 mmol/L — AB (ref 101–111)
CO2: 29 mmol/L (ref 22–32)
CREATININE: 0.83 mg/dL (ref 0.44–1.00)
Calcium: 9.1 mg/dL (ref 8.9–10.3)
GFR calc non Af Amer: >60 mL/min (ref >60)
Glucose, Bld: 112 mg/dL — ABNORMAL HIGH (ref 65–99)
POTASSIUM: 3 mmol/L — AB (ref 3.5–5.1)
Sodium: 134 mmol/L — ABNORMAL LOW (ref 135–145)
TOTAL PROTEIN: 7.3 g/dL (ref 6.5–8.1)

## 2017-05-22 LAB — CBC
HCT: 44.6 % (ref 36.0–46.0)
Hemoglobin: 15.4 g/dL — ABNORMAL HIGH (ref 12.0–15.0)
MCH: 33 pg (ref 26.0–34.0)
MCHC: 34.5 g/dL (ref 30.0–36.0)
MCV: 95.7 fL (ref 78.0–100.0)
PLATELETS: 208 10*3/uL (ref 150–400)
RBC: 4.66 MIL/uL (ref 3.87–5.11)
RDW: 14.2 % (ref 11.5–15.5)
WBC: 11 10*3/uL — ABNORMAL HIGH (ref 4.0–10.5)

## 2017-05-22 MED ORDER — THIAMINE HCL 100 MG/ML IJ SOLN
Freq: Once | INTRAVENOUS | Status: AC
  Administered 2017-05-23: via INTRAVENOUS
  Filled 2017-05-22: qty 1000

## 2017-05-22 NOTE — ED Notes (Signed)
Patient transported to CT 

## 2017-05-22 NOTE — ED Triage Notes (Signed)
Patient arrives with about 6 days of fatigue. States I haven't been out of bed since Monday. Denies other symptoms at this. Daughter at bedside whom holds HCPOA states patient has been drinking ETOH all week. Daughter states patient purchased 1/2 gallon of Rum on Monday and it was gone by [place] or [employer].

## 2017-05-22 NOTE — ED Triage Notes (Signed)
Patient states she hasn't had any ETOH in at least 2 days.

## 2017-05-22 NOTE — ED Triage Notes (Signed)
Daughter explains that patient has long standing history of ETOH abuse. Recent death of husband. History of Hypokalemia related to ETOH abuse. Has not taken home meds for several days as result of fatigue.

## 2017-05-22 NOTE — ED Provider Notes (Signed)
[facility]-EMERGENCY DEPT Provider Note   CSN: [id number] Arrival date & time: 05/22/17  2121  By signing my name below, I, [name], attest that this documentation has been prepared under the direction and in the presence of Horton, Mayer Masker, MD. Electronically Signed: [name], ED Scribe. 05/22/17. 11:20 PM.     History   Chief Complaint Chief Complaint  Patient presents with  . Fatigue    HPI Christina Ortiz is a 83 y.o. female who presents to the Emergency Department complaining of generalized fatigue and weakness x 5 days. Pt reports that she consumed a bottle of liquor within a 2 day time span about a week ago, but has not had any alcohol since that time. Daughter is concerned that the pt is not eating, taking her medications or taking care of herself. Pt states she has not had an appetite with her symptoms, and daughter reports she has gotten the pt to consume ensure and gatorade after a few days of not eating. No recent illnesses or fever. Pt lives alone. Pt states she occasionally binge drinks and subsequently feels this way for days. Daughter states this has happened 3 times this year since the death of the pts husband, and this episode is the worst. Daughter also reports a recent fall in which the pt landed on her buttock, and has since noticed some swelling to the pts right hand. Pt denies LOC or head injury. Pt is currently on Xarelto. She denies dizziness, SOB, CP, abdominal pain, nausea, vomiting, diarrhea.   The history is provided by the patient and a relative. No language interpreter was used.    Past Medical History:  Diagnosis Date  . Arthritis   . CKD (chronic kidney disease)    Xarelto dose is 15 mg QD  . Coronary artery disease    a. s/p PCI/stenting to RCA and LAD ([facility] in [place]) in 2006; b. Nuc 1/15: No ischemia, EF 70%  . Depression   . Gastritis   . History of cholecystectomy   . History of echocardiogram    a. Echo 1/15: Moderate LVH, EF  55-60%, impaired relaxation, mild AS  . Hyperlipidemia   . Hypertension   . Mobitz type 1 second degree atrioventricular block    a. Event Monitor 3/15: NSR, first-degree AV block, second-degree AB block - Mobitz 1, PACs, NSVT (5 beats)  . PAF (paroxysmal atrial fibrillation) (HCC)   . Thyroid disease     Patient Active Problem List   Diagnosis Date Noted  . Depression, recurrent (HCC) 03/30/2017  . Lumbago 03/30/2017  . Influenza with respiratory manifestation 01/22/2017  . Coronary artery disease due to lipid rich plaque 04/04/2015  . Essential hypertension 04/04/2015  . Hyperlipidemia 04/04/2015  . Former smoker 04/04/2015  . Chronic anticoagulation 04/04/2015    Past Surgical History:  Procedure Laterality Date  . BREAST LUMPECTOMY Left   . CARDIAC CATHETERIZATION    . CATARACT EXTRACTION Bilateral   . CHOLECYSTECTOMY    . DILATION AND CURETTAGE OF UTERUS    . EYE SURGERY      OB History    No data available       Home Medications    Prior to Admission medications   Medication Sig Start Date End Date Taking? Authorizing Provider  amLODipine (NORVASC) 10 MG tablet Take 1 tablet (10 mg total) by mouth daily. 11/06/15   Jake Bathe, MD  doxazosin (CARDURA) 8 MG tablet Take 8 mg by mouth daily.  [provider]  levothyroxine (SYNTHROID, LEVOTHROID) 75 MCG tablet Take 75 mcg by mouth daily before breakfast.    [provider]  metoprolol succinate (TOPROL-XL) 25 MG 24 hr tablet Take 1 tablet (25 mg total) by mouth daily. 03/22/17   Jake Bathe, MD  omeprazole (PRILOSEC) 20 MG capsule Take 20 mg by mouth daily as needed (heartburn).     [provider]  PARoxetine (PAXIL) 40 MG tablet Take 40 mg by mouth every morning.    [provider]  potassium chloride (K-DUR) 10 MEQ tablet Take 1 tablet (10 mEq total) by mouth daily. 05/02/17   Danford, Orpha Bur D, NP  potassium chloride 20 MEQ TBCR Take 10 mEq by mouth 2 (two) times daily.  05/23/17   Horton, Mayer Masker, MD  quinapril (ACCUPRIL) 40 MG tablet Take 40 mg by mouth daily. 01/23/15   [provider]  simvastatin (ZOCOR) 5 MG tablet Take 5 mg by mouth every evening.  01/28/15   [provider]  XARELTO 20 MG TABS tablet TAKE 1 TABLET (20 MG TOTAL) BY MOUTH DAILY WITH SUPPER. 01/14/17   Beatrice Lecher, PA-C    Family History Family History  Problem Relation Age of Onset  . Heart disease Mother   . Heart attack Mother   . Lung cancer Father   . Cancer Father        lung    Social History Social History  Substance Use Topics  . Smoking status: Former Smoker    Packs/day: 0.50    Years: 67.00    Types: Cigarettes    Quit date: 10/25/2015  . Smokeless tobacco: Never Used     Comment: quit 2015  . Alcohol use 6.0 oz/week    10 Shots of liquor per week     Allergies   Tape   Review of Systems Review of Systems  Constitutional: Positive for appetite change and fatigue (generalized). Negative for fever.  Respiratory: Negative for shortness of breath.   Cardiovascular: Negative for chest pain.  Gastrointestinal: Negative for abdominal pain, diarrhea, nausea and vomiting.  Neurological: Positive for weakness (generalized). Negative for dizziness and syncope.  All other systems reviewed and are negative.    Physical Exam Updated Vital Signs BP (!) 138/97   Pulse (!) 104   Temp 97.6 F (36.4 C) (Oral)   Resp (!) 23   SpO2 94%   Physical Exam  Constitutional: She is oriented to person, place, and time. No distress.  Elderly  HENT:  Head: Normocephalic and atraumatic.  Eyes: Pupils are equal, round, and reactive to light.  Pupils 3 mm reactive bilaterally  Cardiovascular: Normal rate and normal heart sounds.   Irregular rhythm  Pulmonary/Chest: Effort normal and breath sounds normal. No respiratory distress. She has no wheezes.  Abdominal: Soft. Bowel sounds are normal. There is no tenderness.  Neurological: She is alert and  oriented to person, place, and time.  Cranial nerves II through XII intact, 5 out of 5 strength in all 4 extremities, no dysmetria to finger-nose-finger, steady gait  Skin: Skin is warm and dry.  Psychiatric: She has a normal mood and affect.  Nursing note and vitals reviewed.    ED Treatments / Results   DIAGNOSTIC STUDIES: Oxygen Saturation is 92% on RA, low by my interpretation.    COORDINATION OF CARE: 11:16 PM Discussed treatment plan with pt at bedside which includes labs and pt agreed to plan.    Labs (all labs ordered are listed,  but only abnormal results are displayed) Labs Reviewed  COMPREHENSIVE METABOLIC PANEL - Abnormal; Notable for the following:       Result Value   Sodium 134 (*)    Potassium 3.0 (*)    Chloride 95 (*)    Glucose, Bld 112 (*)    AST 66 (*)    All other components within normal limits  CBC - Abnormal; Notable for the following:    WBC 11.0 (*)    Hemoglobin 15.4 (*)    All other components within normal limits  URINALYSIS, ROUTINE W REFLEX MICROSCOPIC - Abnormal; Notable for the following:    APPearance HAZY (*)    Hgb urine dipstick SMALL (*)    Protein, ur 30 (*)    Leukocytes, UA LARGE (*)    Bacteria, UA RARE (*)    Squamous Epithelial / LPF 0-5 (*)    All other components within normal limits  ETHANOL    EKG  EKG Interpretation  Date/Time:  Sunday May 22 2017 23:47:48 EDT Ventricular Rate:  78 PR Interval:    QRS Duration: 96 QT Interval:  415 QTC Calculation: 514 R Axis:   122 Text Interpretation:  atrial fibrillation Prolonged PR interval Probable anterolateral infarct, age indeterm Prolonged QT interval Confirmed by Ross Marcus ([id number]) on 05/22/2017 11:58:44 PM       Radiology Ct Head Wo Contrast  Result Date: 05/22/2017 CLINICAL DATA:  83 year old female with generalized weakness. EXAM: CT HEAD WITHOUT CONTRAST TECHNIQUE: Contiguous axial images were obtained from the base of the skull through the vertex without  intravenous contrast. COMPARISON:  Head CT dated 11/08/2015 FINDINGS: Brain: There is moderate age-related atrophy and chronic microvascular ischemic changes. Subcentimeter left lentiform nucleus old lacunar infarcts noted. A 1 cm right cerebellar hemisphere old lacunar infarct appears similar to prior CT. There is no acute intracranial hemorrhage. No mass effect or midline shift noted. No extra-axial fluid collection. Vascular: No hyperdense vessel or unexpected calcification. Skull: Normal. Negative for fracture or focal lesion. Sinuses/Orbits: There is diffuse mucoperiosteal thickening of paranasal sinuses primarily involving the left maxillary sinus. No air-fluid levels. There is partial opacification of the mastoid air cells bilaterally. Other: None IMPRESSION: No acute intracranial hemorrhage. Age-related atrophy and chronic microvascular ischemic disease. If symptoms persist and there are no contraindications, MRI may provide better evaluation if clinically indicated. Electronically Signed   By: Elgie Collard M.D.   On: 05/22/2017 23:48    Procedures Procedures (including critical care time)  Medications Ordered in ED Medications  sodium chloride 0.9 % 1,000 mL with thiamine 100 mg, folic acid 1 mg, multivitamins adult 10 mL, potassium chloride 40 mEq infusion ( Intravenous New Bag/Given 05/23/17 0017)  potassium chloride SA (K-DUR,KLOR-CON) CR tablet 40 mEq (40 mEq Oral Given 05/23/17 0155)     Initial Impression / Assessment and Plan / ED Course  I have reviewed the triage vital signs and the nursing notes.  Pertinent labs & imaging results that were available during my care of the patient were reviewed by me and considered in my medical decision making (see chart for details).     Patient presents with generalized weakness. This is in the setting of binge drinking earlier this week. Daughter reports 3 separate episodes with similar symptoms recently after the passing of her husband.  Patient is neurologically intact. Vital signs reassuring. Lab work notable for mild hypokalemia as well as mild hyponatremia and hypochloremia. Likely mild dehydration. Patient given a banana bag with potassium. Urinalysis without  infection. CT head negative for trauma given recent fall. EKG is nonischemic.  I discussed results with the patient and her daughter. She is ambulatory without difficulty on her own. I discussed with the patient that her alcohol abuse will put her at great risk for falls and significant injury. Patient stated understanding. Daughter is concerned that she may have early onset dementia. My suspicion is she is in response regarding her husband's death resulting in generalized decline, deconditioning, and subsequent alcohol abuse. Recommend follow-up closely with primary physician for recheck and further cognitive testing if warranted.  After history, exam, and medical workup I feel the patient has been appropriately medically screened and is safe for discharge home. Pertinent diagnoses were discussed with the patient. Patient was given return precautions.   Final Clinical Impressions(s) / ED Diagnoses   Final diagnoses:  Weakness  Hypokalemia  Alcohol abuse  Grief reaction    New Prescriptions New Prescriptions   POTASSIUM CHLORIDE 20 MEQ TBCR    Take 10 mEq by mouth 2 (two) times daily.    I personally performed the services described in this documentation, which was scribed in my presence. The recorded information has been reviewed and is accurate.    Shon Baton, MD 05/23/17 [phone]

## 2017-05-23 LAB — URINALYSIS, ROUTINE W REFLEX MICROSCOPIC
BILIRUBIN URINE: NEGATIVE
Glucose, UA: NEGATIVE mg/dL
Ketones, ur: NEGATIVE mg/dL
NITRITE: NEGATIVE
Protein, ur: 30 mg/dL — AB
SPECIFIC GRAVITY, URINE: 1.006 (ref 1.005–1.030)
pH: 7 (ref 5.0–8.0)

## 2017-05-23 LAB — ETHANOL: Alcohol, Ethyl (B): <5 mg/dL (ref <5)

## 2017-05-23 MED ORDER — POTASSIUM CHLORIDE CRYS ER 20 MEQ PO TBCR
40.0000 meq | EXTENDED_RELEASE_TABLET | Freq: Once | ORAL | Status: AC
  Administered 2017-05-23: 40 meq via ORAL
  Filled 2017-05-23: qty 2

## 2017-05-23 MED ORDER — POTASSIUM CHLORIDE ER 20 MEQ PO TBCR: 10.0000 meq | EXTENDED_RELEASE_TABLET | Freq: Two times a day (BID) | ORAL | 0 refills | Status: DC

## 2017-05-23 NOTE — ED Notes (Signed)
Nurse will start IV and draw labs. 

## 2017-05-23 NOTE — Discharge Instructions (Signed)
You were seen today for generalized weakness. This is likely related to your alcohol abuse. Your potassium was noted to be low. This was replaced. Increase potassium to 20 mEq twice daily. Follow-up with primary physician for recheck and potassium recheck. Is very important to avoid alcohol as you are risk for falls and head trauma given your anticoagulant.

## 2017-05-23 NOTE — ED Notes (Signed)
Pt verbalized understanding discharge instructions and denies any further needs or questions at this time. VS stable, ambulatory and steady gait.   

## 2017-05-25 ENCOUNTER — Ambulatory Visit (INDEPENDENT_AMBULATORY_CARE_PROVIDER_SITE_OTHER): Payer: Medicare PPO | Admitting: Adult Health

## 2017-05-25 ENCOUNTER — Encounter: Payer: Self-pay | Admitting: Adult Health

## 2017-05-25 VITALS — BP 97/64 | HR 81 | Temp 98.3°F | Ht 63.0 in | Wt 144.2 lb

## 2017-05-25 DIAGNOSIS — E876 Hypokalemia: Secondary | ICD-10-CM | POA: Insufficient documentation

## 2017-05-25 DIAGNOSIS — F1010 Alcohol abuse, uncomplicated: Secondary | ICD-10-CM | POA: Diagnosis not present

## 2017-05-25 DIAGNOSIS — R197 Diarrhea, unspecified: Secondary | ICD-10-CM | POA: Diagnosis not present

## 2017-05-25 DIAGNOSIS — F339 Major depressive disorder, recurrent, unspecified: Secondary | ICD-10-CM | POA: Diagnosis not present

## 2017-05-25 NOTE — Patient Instructions (Addendum)
Diarrhea, Adult Diarrhea is frequent loose and watery bowel movements. Diarrhea can make you feel weak and cause you to become dehydrated. Dehydration can make you tired and thirsty, cause you to have a dry mouth, and decrease how often you urinate. Diarrhea typically lasts 2-3 days. However, it can last longer if it is a sign of something more serious. It is important to treat your diarrhea as told by your health care provider. Follow these instructions at home: Eating and drinking   Follow these recommendations as told by your health care provider:  Take an oral rehydration solution (ORS). This is a drink that is sold at pharmacies and retail stores.  Drink clear fluids, such as water, ice chips, diluted fruit juice, and low-calorie sports drinks.  Eat bland, easy-to-digest foods in small amounts as you are able. These foods include bananas, applesauce, rice, lean meats, toast, and crackers.  Avoid drinking fluids that contain a lot of sugar or caffeine, such as energy drinks, sports drinks, and soda.  Avoid alcohol.  Avoid spicy or fatty foods. General instructions   Drink enough fluid to keep your urine clear or pale yellow.  Wash your hands often. If soap and water are not available, use hand sanitizer.  Make sure that all people in your household wash their hands well and often.  Take over-the-counter and prescription medicines only as told by your health care provider.  Rest at home while you recover.  Watch your condition for any changes.  Take a warm bath to relieve any burning or pain from frequent diarrhea episodes.  Keep all follow-up visits as told by your health care provider. This is important. Contact a health care provider if:  You have a fever.  Your diarrhea gets worse.  You have new symptoms.  You cannot keep fluids down.  You feel light-headed or dizzy.  You have a headache  You have muscle cramps. Get help right away if:  You have chest  pain.  You feel extremely weak or you faint.  You have bloody or black stools or stools that look like tar.  You have severe pain, cramping, or bloating in your abdomen.  You have trouble breathing or you are breathing very quickly.  Your heart is beating very quickly.  Your skin feels cold and clammy.  You feel confused.  You have signs of dehydration, such as:  Dark urine, very little urine, or no urine.  Cracked lips.  Dry mouth.  Sunken eyes.  Sleepiness.  Weakness. This information is not intended to replace advice given to you by your health care provider. Make sure you discuss any questions you have with your health care provider. Document Released: 12/03/2002 Document Revised: 04/22/2016 Document Reviewed: 08/19/2015 Elsevier Interactive Patient Education  2017 [employer].   Increase water, gatorade, Ensure intake. Follow BRAT diet (Bananas, Rice, Applesauce, Toast). Increase daily movement. Try to go to bed and rise same time everyday. Therapy referral placed. Reduce alcohol consumption. We will call when lab results are available.

## 2017-05-25 NOTE — Assessment & Plan Note (Signed)
CMP drawn today. 05/22/17 K+ level 3.0 Was taking K+ daily, ED provider increased K+ BID. Stop ETOH use.

## 2017-05-25 NOTE — Progress Notes (Signed)
Subjective:    Patient ID: Christina Ortiz, female    DOB: 04/13/1929, 83 y.o.   MRN: [id number]  HPI:  Christina Ortiz presents with  05/22/17 ED Impression Notes:  "Patient presents with generalized weakness. This is in the setting of binge drinking earlier this week. Daughter reports 3 separate episodes with similar symptoms recently after the passing of her husband. Patient is neurologically intact. Vital signs reassuring. Lab work notable for mild hypokalemia as well as mild hyponatremia and hypochloremia. Likely mild dehydration. Patient given a banana bag with potassium. Urinalysis without infection. CT head negative for trauma given recent fall. EKG is nonischemic".  She ingested large amount of vodka last week and was treated in ED-please see EPIC notes. She continues to feel weak and is having loose stools (3 a day) since d/c from ED.  She denies any recent ETOH use. She reports poor appetite, however this has been occurring for months.  She has lost 10lbs since last OV 03/30/2017.  She reports eating very little food daily (toast), and reports that most of her calories come from Gatorade and Ensure.  She continues to struggle with the death of her husband earlier this year.  She has not seen a therapist, however is more open to therapy either at behavioral health or with Hospice counselors.  Her daughter is at [facility] and understandably frustrated with there mother's continued excessive drinking and poor health. During the conversation between the 3 of [place], it came to light that pt will consume entire bottle of vodka, gin, or bacardi if it is in home. She will only consume 2 vodka drinks if out at lunch.  She denies driving or cooking while under the influence. She states "I know I need to cut back on the drinking but I don't want to".  She is oriented x 3 and able to clearly articulate her concerns/needs/opinions.   Patient Care Team    Relationship Specialty Notifications Start End  William Hamburger  D, NP PCP - General Family Medicine  03/30/17   Jake Bathe, MD Consulting Physician Cardiology  03/30/17   Mateo Flow, MD Consulting Physician Ophthalmology  03/30/17   [facility]    03/30/17     Patient Active Problem List   Diagnosis Date Noted  . Diarrhea 05/25/2017  . Hypokalemia 05/25/2017  . Depression, recurrent (HCC) 03/30/2017  . Lumbago 03/30/2017  . Influenza with respiratory manifestation 01/22/2017  . Coronary artery disease due to lipid rich plaque 04/04/2015  . Essential hypertension 04/04/2015  . Hyperlipidemia 04/04/2015  . Former smoker 04/04/2015  . Chronic anticoagulation 04/04/2015     Past Medical History:  Diagnosis Date  . Arthritis   . CKD (chronic kidney disease)    Xarelto dose is 15 mg QD  . Coronary artery disease    a. s/p PCI/stenting to RCA and LAD ([facility] in [place]) in 2006; b. Nuc 1/15: No ischemia, EF 70%  . Depression   . Gastritis   . History of cholecystectomy   . History of echocardiogram    a. Echo 1/15: Moderate LVH, EF 55-60%, impaired relaxation, mild AS  . Hyperlipidemia   . Hypertension   . Hypokalemia   . Mobitz type 1 second degree atrioventricular block    a. Event Monitor 3/15: NSR, first-degree AV block, second-degree AB block - Mobitz 1, PACs, NSVT (5 beats)  . PAF (paroxysmal atrial fibrillation) (HCC)   . Thyroid disease      Past Surgical  History:  Procedure Laterality Date  . BREAST LUMPECTOMY Left   . CARDIAC CATHETERIZATION    . CATARACT EXTRACTION Bilateral   . CHOLECYSTECTOMY    . DILATION AND CURETTAGE OF UTERUS    . EYE SURGERY       Family History  Problem Relation Age of Onset  . Heart disease Mother   . Heart attack Mother   . Lung cancer Father   . Cancer Father        lung     History  Drug Use No     History  Alcohol Use  . 6.0 oz/week  . 10 Shots of liquor per week     History  Smoking Status  . Former Smoker  . Packs/day: 0.50  .  Years: 67.00  . Types: Cigarettes  . Quit date: 10/25/2015  Smokeless Tobacco  . Never Used    Comment: quit 2015     Outpatient Encounter Prescriptions as of 05/25/2017  Medication Sig  . amLODipine (NORVASC) 10 MG tablet Take 1 tablet (10 mg total) by mouth daily.  Marland Kitchen doxazosin (CARDURA) 8 MG tablet Take 8 mg by mouth daily.  Marland Kitchen levothyroxine (SYNTHROID, LEVOTHROID) 75 MCG tablet Take 75 mcg by mouth daily before breakfast.  . metoprolol succinate (TOPROL-XL) 25 MG 24 hr tablet Take 1 tablet (25 mg total) by mouth daily.  Marland Kitchen omeprazole (PRILOSEC) 20 MG capsule Take 20 mg by mouth daily as needed (heartburn).   Marland Kitchen PARoxetine (PAXIL) 40 MG tablet Take 40 mg by mouth every morning.  . potassium chloride (K-DUR) 10 MEQ tablet Take 1 tablet (10 mEq total) by mouth daily.  . potassium chloride 20 MEQ TBCR Take 10 mEq by mouth 2 (two) times daily.  . quinapril (ACCUPRIL) 40 MG tablet Take 40 mg by mouth daily.  . simvastatin (ZOCOR) 5 MG tablet Take 5 mg by mouth every evening.   [name] 20 MG TABS tablet TAKE 1 TABLET (20 MG TOTAL) BY MOUTH DAILY WITH SUPPER.   No facility-administered encounter medications on file as of 05/25/2017.     Allergies: Tape  Body mass index is 25.54 kg/m.  Blood pressure 97/64, pulse 81, temperature 98.3 F (36.8 C), temperature source Oral, height 5\' 3"  (1.6 m), weight 144 lb 3.2 oz (65.4 kg).    Review of Systems  Constitutional: Positive for activity change, appetite change, fatigue and unexpected weight change. Negative for chills, diaphoresis and fever.  Respiratory: Negative for cough, chest tightness, shortness of breath, wheezing and stridor.   Cardiovascular: Negative for chest pain, palpitations and leg swelling.  Gastrointestinal: Positive for diarrhea. Negative for abdominal distention, abdominal pain, blood in stool, constipation, nausea and vomiting.  Endocrine: Negative for cold intolerance, heat intolerance, polydipsia, polyphagia and  polyuria.  Genitourinary: Negative for difficulty urinating, flank pain and hematuria.  Musculoskeletal: Positive for arthralgias, back pain, gait problem, joint swelling, myalgias, neck pain and neck stiffness.  Skin: Negative for color change, pallor, rash and wound.  Allergic/Immunologic: Negative for immunocompromised state.  Neurological: Positive for weakness and light-headedness. Negative for dizziness, tremors and headaches.  Hematological: Does not bruise/bleed easily.  Psychiatric/Behavioral: Positive for dysphoric mood and sleep disturbance. Negative for self-injury and suicidal ideas.       Objective:   Physical Exam  Constitutional: She is oriented to person, place, and time. No distress.  Neck: Normal range of motion.  Cardiovascular: Normal rate and intact distal pulses.   Murmur heard. Pulmonary/Chest: Effort normal and breath sounds normal. No  respiratory distress. She has no wheezes. She has no rales. She exhibits no tenderness.  Abdominal: Soft. Bowel sounds are normal. She exhibits no distension and no mass. There is no tenderness. There is no rigidity, no rebound, no guarding, no CVA tenderness, no tenderness at McBurney's point and negative Murphy's sign.  Lymphadenopathy:    She has no cervical adenopathy.  Neurological: She is alert and oriented to person, place, and time. Coordination normal.  Skin: Skin is warm and dry. No rash noted. She is not diaphoretic. No erythema. No pallor.  Psychiatric: She has a normal mood and affect. Her behavior is normal. Judgment and thought content normal.  Nursing note and vitals reviewed.         Assessment & Plan:   1. Diarrhea, unspecified type   2. Hypokalemia   3. Depression, recurrent (HCC)   4. Alcohol abuse     Depression, recurrent (HCC) Continue daily Paroxetine 40mg . CBT referral placed.  Diarrhea BRAT diet Increase water, Gatorade, Ensure intake. Stop ETOH. Labs CBC and CMP obtained today. Stool  cards and ova/parasite kits provided. Once all studies have resulted and if loose stools persist-GI referral will be placed.   Hypokalemia CMP drawn today. 05/22/17 K+ level 3.0 Was taking K+ daily, ED provider increased K+ BID. Stop ETOH use.   Alcohol abuse Reports drinking hard liquor: 2 drinks at lunch several days a week and will drink entire bottle (pint, 5th, half gallon) several times a month. She has never been in ETOH rehab and refuses referral today.    FOLLOW-UP:  Return if symptoms worsen or fail to improve.

## 2017-05-25 NOTE — Assessment & Plan Note (Signed)
Reports drinking hard liquor: 2 drinks at lunch several days a week and will drink entire bottle (pint, 5th, half gallon) several times a month. She has never been in ETOH rehab and refuses referral today.

## 2017-05-25 NOTE — Assessment & Plan Note (Signed)
Continue daily Paroxetine 40mg . CBT referral placed.

## 2017-05-25 NOTE — Assessment & Plan Note (Signed)
BRAT diet Increase water, Gatorade, Ensure intake. Stop ETOH. Labs CBC and CMP obtained today. Stool cards and ova/parasite kits provided. Once all studies have resulted and if loose stools persist-GI referral will be placed.

## 2017-05-26 LAB — COMPREHENSIVE METABOLIC PANEL
A/G RATIO: 1.3 (ref 1.2–2.2)
ALK PHOS: 93 IU/L (ref 39–117)
ALT: 17 IU/L (ref 0–32)
AST: 56 IU/L — AB (ref 0–40)
Albumin: 4.3 g/dL (ref 3.5–4.7)
BILIRUBIN TOTAL: 0.6 mg/dL (ref 0.0–1.2)
BUN/Creatinine Ratio: 12 (ref 12–28)
BUN: 14 mg/dL (ref 8–27)
CHLORIDE: 99 mmol/L (ref 96–106)
CO2: 23 mmol/L (ref 18–29)
Calcium: 10.3 mg/dL (ref 8.7–10.3)
Creatinine, Ser: 1.13 mg/dL — ABNORMAL HIGH (ref 0.57–1.00)
GFR calc non Af Amer: 44 mL/min/{1.73_m2} — ABNORMAL LOW (ref >59)
GFR, EST AFRICAN AMERICAN: 51 mL/min/{1.73_m2} — AB (ref >59)
Globulin, Total: 3.3 g/dL (ref 1.5–4.5)
Glucose: 104 mg/dL — ABNORMAL HIGH (ref 65–99)
POTASSIUM: 4.5 mmol/L (ref 3.5–5.2)
Sodium: 144 mmol/L (ref 134–144)
TOTAL PROTEIN: 7.6 g/dL (ref 6.0–8.5)

## 2017-05-26 LAB — CBC WITH DIFFERENTIAL/PLATELET
BASOS ABS: 0 10*3/uL (ref 0.0–0.2)
BASOS: 0 %
EOS (ABSOLUTE): 0.1 10*3/uL (ref 0.0–0.4)
Eos: 1 %
Hematocrit: 45.6 % (ref 34.0–46.6)
Hemoglobin: 14.9 g/dL (ref 11.1–15.9)
IMMATURE GRANS (ABS): 0 10*3/uL (ref 0.0–0.1)
Immature Granulocytes: 1 %
LYMPHS: 16 %
Lymphocytes Absolute: 1.4 10*3/uL (ref 0.7–3.1)
MCH: 33.1 pg — AB (ref 26.6–33.0)
MCHC: 32.7 g/dL (ref 31.5–35.7)
MCV: 101 fL — AB (ref 79–97)
MONOS ABS: 0.9 10*3/uL (ref 0.1–0.9)
Monocytes: 10 %
NEUTROS ABS: 6.2 10*3/uL (ref 1.4–7.0)
NEUTROS PCT: 72 %
PLATELETS: 199 10*3/uL (ref 150–379)
RBC: 4.5 x10E6/uL (ref 3.77–5.28)
RDW: 14.6 % (ref 12.3–15.4)
WBC: 8.7 10*3/uL (ref 3.4–10.8)

## 2017-05-30 ENCOUNTER — Other Ambulatory Visit: Payer: Self-pay | Admitting: Adult Health

## 2017-05-30 DIAGNOSIS — E876 Hypokalemia: Secondary | ICD-10-CM

## 2017-05-30 MED ORDER — POTASSIUM CHLORIDE ER 10 MEQ PO TBCR: 10.0000 meq | EXTENDED_RELEASE_TABLET | Freq: Every day | ORAL | 2 refills | Status: DC

## 2017-05-30 NOTE — Addendum Note (Signed)
Addended by: Stan Head on: 05/30/2017 02:24 PM   Modules accepted: Orders

## 2017-06-04 LAB — STOOL CULTURE
E COLI SHIGA TOXIN ASSAY: NEGATIVE
E coli, Shiga toxin Assay: NEGATIVE
E coli, Shiga toxin Assay: NEGATIVE

## 2017-06-09 ENCOUNTER — Telehealth: Payer: Self-pay

## 2017-06-09 LAB — OVA AND PARASITE EXAMINATION

## 2017-06-09 NOTE — Telephone Encounter (Signed)
Left message for patient to call office to inform her of lab results.

## 2017-06-09 NOTE — Telephone Encounter (Signed)
-----   Message from Julaine Fusi, NP sent at 06/09/2017  8:07 AM EDT ----- Good Morning, Please call Christina Ortiz and share that her stool specimen was negative for Ova and Parasite Infection. Please encourage her to keep her f/u in a few weeks. Thanks! Orpha Bur

## 2017-06-21 ENCOUNTER — Ambulatory Visit (INDEPENDENT_AMBULATORY_CARE_PROVIDER_SITE_OTHER): Payer: Medicare PPO | Admitting: Adult Health

## 2017-06-21 ENCOUNTER — Encounter: Payer: Self-pay | Admitting: Adult Health

## 2017-06-21 VITALS — BP 120/74 | HR 76 | Wt 146.0 lb

## 2017-06-21 DIAGNOSIS — E876 Hypokalemia: Secondary | ICD-10-CM | POA: Diagnosis not present

## 2017-06-21 DIAGNOSIS — I10 Essential (primary) hypertension: Secondary | ICD-10-CM

## 2017-06-21 DIAGNOSIS — R7989 Other specified abnormal findings of blood chemistry: Secondary | ICD-10-CM | POA: Insufficient documentation

## 2017-06-21 DIAGNOSIS — F339 Major depressive disorder, recurrent, unspecified: Secondary | ICD-10-CM

## 2017-06-21 DIAGNOSIS — M545 Low back pain: Secondary | ICD-10-CM

## 2017-06-21 DIAGNOSIS — F1010 Alcohol abuse, uncomplicated: Secondary | ICD-10-CM | POA: Diagnosis not present

## 2017-06-21 DIAGNOSIS — M25473 Effusion, unspecified ankle: Secondary | ICD-10-CM | POA: Insufficient documentation

## 2017-06-21 DIAGNOSIS — Z7901 Long term (current) use of anticoagulants: Secondary | ICD-10-CM

## 2017-06-21 DIAGNOSIS — M25472 Effusion, left ankle: Secondary | ICD-10-CM

## 2017-06-21 DIAGNOSIS — M25471 Effusion, right ankle: Secondary | ICD-10-CM

## 2017-06-21 DIAGNOSIS — G8929 Other chronic pain: Secondary | ICD-10-CM

## 2017-06-21 DIAGNOSIS — R197 Diarrhea, unspecified: Secondary | ICD-10-CM

## 2017-06-21 NOTE — Assessment & Plan Note (Signed)
Recommend water aerobics to ease pain and improve joint health. Continue to rest as needed when back pain flares.

## 2017-06-21 NOTE — Assessment & Plan Note (Signed)
05/25/17 Ref Range & Units 3wk ago   Glucose 65 - 99 mg/dL 379    BUN 8 - 27 mg/dL 14   Creatinine, Ser 0.24 - 1.00 mg/dL 0.97    GFR calc non Af Amer >59 mL/min/1.73 44    GFR calc Af Amer >59 mL/min/1.73 51    BUN/Creatinine Ratio 12 - 28 12    Mat creat is increase from her baseline, CMP drawn today.

## 2017-06-21 NOTE — Assessment & Plan Note (Signed)
Reports only consuming 4drinks/week-EXCELLENT REDUCTION! Discussed at length CDC safe drinking guidelines for women.

## 2017-06-21 NOTE — Assessment & Plan Note (Addendum)
Loose stools have resolved for weeks. Reviewed all recent stool specimen results with pt.

## 2017-06-21 NOTE — Assessment & Plan Note (Signed)
Stable on Paroxetine 40mg  daily. Still has not established with therapist or taken advantage of Hospice counselors-encouraged to do so.

## 2017-06-21 NOTE — Patient Instructions (Signed)
Edema Edema is an abnormal buildup of fluids in your bodytissues. Edema is somewhatdependent on gravity to pull the fluid to the lowest place in your body. That makes the condition more common in the legs and thighs (lower extremities). Painless swelling of the feet and ankles is common and becomes more likely as you get older. It is also common in looser tissues, like around your eyes. When the affected area is squeezed, the fluid may move out of that spot and leave a dent for a few moments. This dent is called pitting. What are the causes? There are many possible causes of edema. Eating too much salt and being on your feet or sitting for a long time can cause edema in your legs and ankles. Hot weather may make edema worse. Common medical causes of edema include:  Heart failure.  Liver disease.  Kidney disease.  Weak blood vessels in your legs.  Cancer.  An injury.  Pregnancy.  Some medications.  Obesity.  What are the signs or symptoms? Edema is usually painless.Your skin may look swollen or shiny. How is this diagnosed? Your health care provider may be able to diagnose edema by asking about your medical history and doing a physical exam. You may need to have tests such as X-rays, an electrocardiogram, or blood tests to check for medical conditions that may cause edema. How is this treated? Edema treatment depends on the cause. If you have heart, liver, or kidney disease, you need the treatment appropriate for these conditions. General treatment may include:  Elevation of the affected body part above the level of your heart.  Compression of the affected body part. Pressure from elastic bandages or support stockings squeezes the tissues and forces fluid back into the blood vessels. This keeps fluid from entering the tissues.  Restriction of fluid and salt intake.  Use of a water pill (diuretic). These medications are appropriate only for some types of edema. They pull fluid  out of your body and make you urinate more often. This gets rid of fluid and reduces swelling, but diuretics can have side effects. Only use diuretics as directed by your health care provider.  Follow these instructions at home:  Keep the affected body part above the level of your heart when you are lying down.  Do not sit still or stand for prolonged periods.  Do not put anything directly under your knees when lying down.  Do not wear constricting clothing or garters on your upper legs.  Exercise your legs to work the fluid back into your blood vessels. This may help the swelling go down.  Wear elastic bandages or support stockings to reduce ankle swelling as directed by your health care provider.  Eat a low-salt diet to reduce fluid if your health care provider recommends it.  Only take medicines as directed by your health care provider. Contact a health care provider if:  Your edema is not responding to treatment.  You have heart, liver, or kidney disease and notice symptoms of edema.  You have edema in your legs that does not improve after elevating them.  You have sudden and unexplained weight gain. Get help right away if:  You develop shortness of breath or chest pain.  You cannot breathe when you lie down.  You develop pain, redness, or warmth in the swollen areas.  You have heart, liver, or kidney disease and suddenly get edema.  You have a fever and your symptoms suddenly get worse. This information is   not intended to replace advice given to you by your health care provider. Make sure you discuss any questions you have with your health care provider. Document Released: 12/13/2005 Document Revised: 05/20/2016 Document Reviewed: 10/05/2013 Elsevier Interactive Patient Education  2017 [employer] Inc[employer]    Heart-Healthy Eating Plan Many factors influence your heart health, including eating and exercise habits. Heart (coronary) risk increases with abnormal blood fat  (lipid) levels. Heart-healthy meal planning includes limiting unhealthy fats, increasing healthy fats, and making other small dietary changes. This includes maintaining a healthy body weight to help keep lipid levels within a normal range. What is my plan? Your health care provider recommends that you:  Get no more than _________% of the total calories in your daily diet from fat.  Limit your intake of saturated fat to less than _________% of your total calories each day.  Limit the amount of cholesterol in your diet to less than _________ mg per day.  What types of fat should I choose?  Choose healthy fats more often. Choose monounsaturated and polyunsaturated fats, such as olive oil and canola oil, flaxseeds, walnuts, almonds, and seeds.  Eat more omega-3 fats. Good choices include salmon, mackerel, sardines, tuna, flaxseed oil, and ground flaxseeds. Aim to eat fish at least two times each week.  Limit saturated fats. Saturated fats are primarily found in animal products, such as meats, butter, and cream. Plant sources of saturated fats include palm oil, palm kernel oil, and coconut oil.  Avoid foods with partially hydrogenated oils in them. These contain trans fats. Examples of foods that contain trans fats are stick margarine, some tub margarines, cookies, crackers, and other baked goods. What general guidelines do I need to follow?  Check food labels carefully to identify foods with trans fats or high amounts of saturated fat.  Fill one half of your plate with vegetables and green salads. Eat 4-5 servings of vegetables per day. A serving of vegetables equals 1 cup of raw leafy vegetables,  cup of raw or cooked cut-up vegetables, or  cup of vegetable juice.  Fill one fourth of your plate with whole grains. Look for the word "whole" as the first word in the ingredient list.  Fill one fourth of your plate with lean protein foods.  Eat 4-5 servings of fruit per day. A serving of fruit  equals one medium whole fruit,  cup of dried fruit,  cup of fresh, frozen, or canned fruit, or  cup of 100% fruit juice.  Eat more foods that contain soluble fiber. Examples of foods that contain this type of fiber are apples, broccoli, carrots, beans, peas, and barley. Aim to get 20-30 g of fiber per day.  Eat more home-cooked food and less restaurant, buffet, and fast food.  Limit or avoid alcohol.  Limit foods that are high in starch and sugar.  Avoid fried foods.  Cook foods by using methods other than frying. Baking, boiling, grilling, and broiling are all great options. Other fat-reducing suggestions include: ? Removing the skin from poultry. ? Removing all visible fats from meats. ? Skimming the fat off of stews, soups, and gravies before serving them. ? Steaming vegetables in water or broth.  Lose weight if you are overweight. Losing just 5-10% of your initial body weight can help your overall health and prevent diseases such as diabetes and heart disease.  Increase your consumption of nuts, legumes, and seeds to 4-5 servings per week. One serving of dried beans or legumes equals  cup after  being cooked, one serving of nuts equals 1 ounces, and one serving of seeds equals  ounce or 1 tablespoon.  You may need to monitor your salt (sodium) intake, especially if you have high blood pressure. Talk with your health care provider or dietitian to get more information about reducing sodium. What foods can I eat? Grains  Breads, including [place], white, pita, wheat, raisin, rye, oatmeal, and [place]. Tortillas that are neither fried nor made with lard or trans fat. Low-fat rolls, including hotdog and hamburger buns and English muffins. Biscuits. Muffins. Waffles. Pancakes. Light popcorn. Whole-grain cereals. Flatbread. Melba toast. Pretzels. Breadsticks. Rusks. Low-fat snacks and crackers, including oyster, saltine, matzo, graham, animal, and rye. Rice and pasta, including brown rice  and those that are made with whole wheat. Vegetables All vegetables. Fruits All fruits, but limit coconut. Meats and Other Protein Sources Lean, well-trimmed beef, veal, pork, and lamb. Chicken and [place] without skin. All fish and shellfish. Wild duck, rabbit, pheasant, and venison. Egg whites or low-cholesterol egg substitutes. Dried beans, peas, lentils, and tofu.Seeds and most nuts. Dairy Low-fat or nonfat cheeses, including ricotta, string, and mozzarella. Skim or 1% milk that is liquid, powdered, or evaporated. Buttermilk that is made with low-fat milk. Nonfat or low-fat yogurt. Beverages Mineral water. Diet carbonated beverages. Sweets and Desserts Sherbets and fruit ices. Honey, jam, marmalade, jelly, and syrups. Meringues and gelatins. Pure sugar candy, such as hard candy, jelly beans, gumdrops, mints, marshmallows, and small amounts of dark chocolate. [employer]. Eat all sweets and desserts in moderation. Fats and Oils Nonhydrogenated (trans-free) margarines. Vegetable oils, including soybean, sesame, sunflower, olive, peanut, safflower, corn, canola, and cottonseed. Salad dressings or mayonnaise that are made with a vegetable oil. Limit added fats and oils that you use for cooking, baking, salads, and as spreads. Other Cocoa powder. Coffee and tea. All seasonings and condiments. The items listed above may not be a complete list of recommended foods or beverages. Contact your dietitian for more options. What foods are not recommended? Grains Breads that are made with saturated or trans fats, oils, or whole milk. Croissants. Butter rolls. Cheese breads. Sweet rolls. Donuts. Buttered popcorn. Chow mein noodles. High-fat crackers, such as cheese or butter crackers. Meats and Other Protein Sources Fatty meats, such as hotdogs, short ribs, sausage, spareribs, bacon, ribeye roast or steak, and mutton. High-fat deli meats, such as salami and bologna. Caviar. Domestic duck and goose.  Organ meats, such as kidney, liver, sweetbreads, brains, gizzard, chitterlings, and heart. Dairy Cream, sour cream, cream cheese, and creamed cottage cheese. Whole milk cheeses, including blue (bleu), [address], [place], [place], [address], [place], [address], [place], [place], and [place]. Whole or 2% milk that is liquid, evaporated, or condensed. Whole buttermilk. Cream sauce or high-fat cheese sauce. Yogurt that is made from whole milk. Beverages Regular sodas and drinks with added sugar. Sweets and Desserts Frosting. Pudding. Cookies. Cakes other than angel food cake. Candy that has milk chocolate or white chocolate, hydrogenated fat, butter, coconut, or unknown ingredients. Buttered syrups. Full-fat ice cream or ice cream drinks. Fats and Oils Gravy that has suet, meat fat, or shortening. Cocoa butter, hydrogenated oils, palm oil, coconut oil, palm kernel oil. These can often be found in baked products, candy, fried foods, nondairy creamers, and whipped toppings. Solid fats and shortenings, including bacon fat, salt pork, lard, and butter. Nondairy cream substitutes, such as coffee creamers and sour cream substitutes. Salad dressings that are made of unknown oils, cheese, or sour cream. The items listed  above may not be a complete list of foods and beverages to avoid. Contact your dietitian for more information. This information is not intended to replace advice given to you by your health care provider. Make sure you discuss any questions you have with your health care provider. Document Released: 09/21/2008 Document Revised: 07/02/2016 Document Reviewed: 06/06/2014 Elsevier Interactive Patient Education  2017 Else[employer] Inc.   Alcohol Use Disorder Alcohol use disorder is when your drinking disrupts your daily life. When you have this condition, you drink too much alcohol and you cannot control your drinking. Alcohol use disorder can cause serious problems with your physical health. It can affect  your brain, heart, liver, pancreas, immune system, stomach, and intestines. Alcohol use disorder can increase your risk for certain cancers and cause problems with your mental health, such as depression, anxiety, psychosis, delirium, and dementia. People with this disorder risk hurting themselves and others. What are the causes? This condition is caused by drinking too much alcohol over time. It is not caused by drinking too much alcohol only one or two times. Some people with this condition drink alcohol to cope with or escape from negative life events. Others drink to relieve pain or symptoms of mental illness. What increases the risk? You are more likely to develop this condition if:  You have a family history of alcohol use disorder.  Your culture encourages drinking to the point of intoxication, or makes alcohol easy to get.  You had a mood or conduct disorder in childhood.  You have been a victim of abuse.  You are an adolescent and: ? You have poor grades or difficulties in school. ? Your caregivers do not talk to you about saying no to alcohol, or supervise your activities. ? You are impulsive or you have trouble with self-control.  What are the signs or symptoms? Symptoms of this condition include:  Drinkingmore than you want to.  Drinking for longer than you want to.  Trying several times to drink less or to control your drinking.  Spending a lot of time getting alcohol, drinking, or recovering from drinking.  Craving alcohol.  Having problems at work, at school, or at home due to drinking.  Having problems in relationships due to drinking.  Drinking when it is dangerous to drink, such as before driving a car.  Continuing to drink even though you know you might have a physical or mental problem related to drinking.  Needing more and more alcohol to get the same effect you want from the alcohol (building up tolerance).  Having symptoms of withdrawal when you stop  drinking. Symptoms of withdrawal include: ? Fatigue. ? Nightmares. ? Trouble sleeping. ? Depression. ? Anxiety. ? Fever. ? Seizures. ? Severe confusion. ? Feeling or seeing things that are not there (hallucinations). ? Tremors. ? Rapid heart rate. ? Rapid breathing. ? High blood pressure.  Drinking to avoid symptoms of withdrawal.  How is this diagnosed? This condition is diagnosed with an assessment. Your health care provider may start the assessment by asking three or four questions about your drinking. Your health care provider may perform a physical exam or do lab tests to see if you have physical problems resulting from alcohol use. She or he may refer you to a mental health professional for evaluation. How is this treated? Some people with alcohol use disorder are able to reduce their alcohol use to low-risk levels. Others need to completely quit drinking alcohol. When necessary, mental health professionals with specialized training  in substance use treatment can help. Your health care provider can help you decide how severe your alcohol use disorder is and what type of treatment you need. The following forms of treatment are available:  Detoxification. Detoxification involves quitting drinking and using prescription medicines within the first week to help lessen withdrawal symptoms. This treatment is important for people who have had withdrawal symptoms before and for heavy drinkers who are likely to have withdrawal symptoms. Alcohol withdrawal can be dangerous, and in severe cases, it can cause death. Detoxification may be provided in a home, community, or primary care setting, or in a hospital or substance use treatment facility.  Counseling. This treatment is also called talk therapy. It is provided by substance use treatment counselors. A counselor can address the reasons you use alcohol and suggest ways to keep you from drinking again or to prevent problem drinking. The goals of  talk therapy are to: ? Find healthy activities and ways for you to cope with stress. ? Identify and avoid the things that trigger your alcohol use. ? Help you learn how to handle cravings.  Medicines.Medicines can help treat alcohol use disorder by: ? Decreasing alcohol cravings. ? Decreasing the positive feeling you have when you drink alcohol. ? Causing an uncomfortable physical reaction when you drink alcohol (aversion therapy).  Support groups. Support groups are led by people who have quit drinking. They provide emotional support, advice, and guidance.  These forms of treatment are often combined. Some people with this condition benefit from a combination of treatments provided by specialized substance use treatment centers. Follow these instructions at home:  Take over-the-counter and prescription medicines only as told by your health care provider.  Check with your health care provider before starting any new medicines.  Ask friends and family members not to offer you alcohol.  Avoid situations where alcohol is served, including gatherings where others are drinking alcohol.  Create a plan for what to do when you are tempted to use alcohol.  Find hobbies or activities that you enjoy that do not include alcohol.  Keep all follow-up visits as told by your health care provider. This is important. How is this prevented?  If you drink, limit alcohol intake to no more than 1 drink a day for nonpregnant women and 2 drinks a day for men. One drink equals 12 oz of beer, 5 oz of wine, or 1 oz of hard liquor.  If you have a mental health condition, get treatment and support.  Do not give alcohol to adolescents.  If you are an adolescent: ? Do not drink alcohol. ? Do not be afraid to say no if someone offers you alcohol. Speak up about why you do not want to drink. You can be a positive role model for your friends and set a good example for those around you by not drinking  alcohol. ? If your friends drink, spend time with others who do not drink alcohol. Make new friends who do not use alcohol. ? Find healthy ways to manage stress and emotions, such as meditation or deep breathing, exercise, spending time in nature, listening to music, or talking with a trusted friend or family member. Contact a health care provider if:  You are not able to take your medicines as told.  Your symptoms get worse.  You return to drinking alcohol (relapse) and your symptoms get worse. Get help right away if:  You have thoughts about hurting yourself or others. If you ever feel  like you may hurt yourself or others, or have thoughts about taking your own life, get help right away. You can go to your nearest emergency department or call:  Your local emergency services (911 in the [place]  A suicide crisis helpline, such as the National Suicide Prevention Lifeline at (779)699-0114. This is open 24 hours a day.  Summary  Alcohol use disorder is when your drinking disrupts your daily life. When you have this condition, you drink too much alcohol and you cannot control your drinking.  Treatment may include detoxification, counseling, medicine, and support groups.  Ask friends and family members not to offer you alcohol. Avoid situations where alcohol is served.  Get help right away if you have thoughts about hurting yourself or others. This information is not intended to replace advice given to you by your health care provider. Make sure you discuss any questions you have with your health care provider. Document Released: 01/20/2005 Document Revised: 09/09/2016 Document Reviewed: 09/09/2016 Elsevier Interactive Patient Education  2018 [employer].   Overall MUCH IM[employer]OVEMENT from last office visit. Continue all medications as directed. We will call you when lab results are available. Continue to drink plenty of water and follow heart healthy diet. Recommend water aerobics  for back pain and overall joint health. EXCELLENT JOB on reduction of alcohol, CDC safe guidelines recommend max 1 drinks/day for women.   To help reduce ankle swelling-elevate legs as often as possible and recommend use of compression hose. Follow-up in 6 months, sooner if needed.

## 2017-06-21 NOTE — Assessment & Plan Note (Addendum)
BP at goal today 120/74 Toprol 25mg  daily Amlodipine 58m daily Followed by cards every 6 months/PRN

## 2017-06-21 NOTE — Assessment & Plan Note (Addendum)
CMP drawn today. Quinapril 40mg  daily. K Dur 10 mEq daily.

## 2017-06-21 NOTE — Assessment & Plan Note (Signed)
Continue excellent hydration. Elevate legs as often as possible. Recommend TED hose. If edema doesn't improve/worsen, will consider diuretic.

## 2017-06-21 NOTE — Progress Notes (Signed)
Subjective:    Patient ID: Christina Ortiz, female    DOB: [birth year], 83 y.o.   MRN: [id number]  HPI:  Christina Ortiz is here for f/u:  Acute diarrhea and fatigue/wt loss r/t excessive ETOH consumption.  She reports diarrhea has completley resolved and has been able improve diet.  She reports drinking > 60 ounces water/daily and consumes fruits, vegetables, and protein throughout the day.  She denies CP/angina/palpitations/fever/fatigue/abdominal pain/excessvie fatigue.  She reports sleeping > 7hrs a night, however sleep is interrupted with frequent nocturia.  She "cleans house all day" and will frequently take afternoon nap.  The GREATEST improvement is her reduction of ETOH.  She reports only 4 drinks/week and continues to avoid driving/cooking when consuming ETOH. She feels that her depression is table and denies thoughts or harming herself/others, she still refuses CBT. She continues to spend time with her daughter weekly.  Patient Care Team    Relationship Specialty Notifications Start End  William Hamburger D, NP PCP - General Family Medicine  03/30/17   Jake Bathe, MD Consulting Physician Cardiology  03/30/17   Mateo Flow, MD Consulting Physician Ophthalmology  03/30/17   [facility]    03/30/17     Patient Active Problem List   Diagnosis Date Noted  . Elevated serum creatinine 06/21/2017  . Diarrhea 05/25/2017  . Hypokalemia 05/25/2017  . Alcohol abuse 05/25/2017  . Depression, recurrent (HCC) 03/30/2017  . Lumbago 03/30/2017  . Influenza with respiratory manifestation 01/22/2017  . Coronary artery disease due to lipid rich plaque 04/04/2015  . Essential hypertension 04/04/2015  . Hyperlipidemia 04/04/2015  . Former smoker 04/04/2015  . Chronic anticoagulation 04/04/2015     Past Medical History:  Diagnosis Date  . Arthritis   . CKD (chronic kidney disease)    Xarelto dose is 15 mg QD  . Coronary artery disease    a. s/p PCI/stenting to RCA  and LAD ([facility] in [place]) in 2006; b. Nuc 1/15: No ischemia, EF 70%  . Depression   . Gastritis   . History of cholecystectomy   . History of echocardiogram    a. Echo 1/15: Moderate LVH, EF 55-60%, impaired relaxation, mild AS  . Hyperlipidemia   . Hypertension   . Hypokalemia   . Mobitz type 1 second degree atrioventricular block    a. Event Monitor 3/15: NSR, first-degree AV block, second-degree AB block - Mobitz 1, PACs, NSVT (5 beats)  . PAF (paroxysmal atrial fibrillation) (HCC)   . Thyroid disease      Past Surgical History:  Procedure Laterality Date  . BREAST LUMPECTOMY Left   . CARDIAC CATHETERIZATION    . CATARACT EXTRACTION Bilateral   . CHOLECYSTECTOMY    . DILATION AND CURETTAGE OF UTERUS    . EYE SURGERY       Family History  Problem Relation Age of Onset  . Heart disease Mother   . Heart attack Mother   . Lung cancer Father   . Cancer Father        lung     History  Drug Use No     History  Alcohol Use  . 6.0 oz/week  . 10 Shots of liquor per week     History  Smoking Status  . Former Smoker  . Packs/day: 0.50  . Years: 67.00  . Types: Cigarettes  . Quit date: 10/25/2015  Smokeless Tobacco  . Never Used    Comment: quit 2015     Outpatient  Encounter Prescriptions as of 06/21/2017  Medication Sig  . amLODipine (NORVASC) 10 MG tablet Take 1 tablet (10 mg total) by mouth daily.  Marland Kitchen doxazosin (CARDURA) 8 MG tablet Take 8 mg by mouth daily.  Marland Kitchen levothyroxine (SYNTHROID, LEVOTHROID) 75 MCG tablet Take 75 mcg by mouth daily before breakfast.  . metoprolol succinate (TOPROL-XL) 25 MG 24 hr tablet Take 1 tablet (25 mg total) by mouth daily.  Marland Kitchen omeprazole (PRILOSEC) 20 MG capsule Take 20 mg by mouth daily as needed (heartburn).   Marland Kitchen PARoxetine (PAXIL) 40 MG tablet Take 40 mg by mouth every morning.  . potassium chloride (K-DUR) 10 MEQ tablet Take 1 tablet (10 mEq total) by mouth daily.  . quinapril (ACCUPRIL) 40 MG tablet Take 40 mg by  mouth daily.  . simvastatin (ZOCOR) 5 MG tablet Take 5 mg by mouth every evening.   [name] 20 MG TABS tablet TAKE 1 TABLET (20 MG TOTAL) BY MOUTH DAILY WITH SUPPER.   No facility-administered encounter medications on file as of 06/21/2017.     Allergies: Tape  Body mass index is 25.86 kg/m.  Blood pressure 120/74, pulse 76, weight 146 lb (66.2 kg).     Review of Systems  Constitutional: Positive for fatigue. Negative for activity change, appetite change, chills, diaphoresis, fever and unexpected weight change.  Eyes: Negative for visual disturbance.  Respiratory: Negative for cough, chest tightness, shortness of breath and stridor.   Cardiovascular: Negative for chest pain, palpitations and leg swelling.  Gastrointestinal: Negative for abdominal distention, abdominal pain, blood in stool, constipation, diarrhea, nausea and vomiting.  Endocrine: Negative for cold intolerance, heat intolerance, polydipsia, polyphagia and polyuria.  Genitourinary: Negative for difficulty urinating, flank pain and hematuria.  Musculoskeletal: Positive for arthralgias, back pain, gait problem, joint swelling, myalgias, neck pain and neck stiffness.  Skin: Negative for color change, pallor, rash and wound.  Allergic/Immunologic: Negative for immunocompromised state.  Neurological: Negative for dizziness and headaches.  Hematological: Bruises/bleeds easily.  Psychiatric/Behavioral: Negative for dysphoric mood, self-injury, sleep disturbance and suicidal ideas. The patient is not nervous/anxious.        Objective:   Physical Exam  Constitutional: She appears well-developed and well-nourished. No distress.  HENT:  Head: Normocephalic and atraumatic.  Right Ear: External ear normal.  Left Ear: External ear normal.  Eyes: Conjunctivae are normal. Pupils are equal, round, and reactive to light.  Neck: Normal range of motion. Neck supple.  Cardiovascular: Normal rate and intact distal pulses.  An  irregular rhythm present.  Murmur heard. Hx of A Fib  Pulmonary/Chest: Effort normal and breath sounds normal.  Abdominal: Soft. Bowel sounds are normal. She exhibits no distension and no mass. There is no tenderness. There is no rebound and no guarding.  Musculoskeletal:       Right ankle: She exhibits swelling.       Left ankle: She exhibits swelling.  Lymphadenopathy:    She has no cervical adenopathy.  Skin: Skin is warm and dry. No rash noted. She is not diaphoretic. No erythema. No pallor.  Psychiatric: She has a normal mood and affect. Her behavior is normal. Judgment and thought content normal.  Nursing note and vitals reviewed.         Assessment & Plan:   1. Essential hypertension   2. Hypokalemia   3. Alcohol abuse   4. Chronic bilateral low back pain, with sciatica presence unspecified   5. Depression, recurrent (HCC)   6. Chronic anticoagulation   7. Diarrhea, unspecified type  8. Elevated serum creatinine     Essential hypertension BP at goal today 120/74 Toprol 25mg  daily Amlodipine 52m daily Followed by cards every 6 months/PRN  Hypokalemia CMP drawn today. Quinapril 40mg  daily. K Dur 10 mEq daily.   Alcohol abuse Reports only consuming 4drinks/week-EXCELLENT REDUCTION! Discussed at length CDC safe drinking guidelines for women.   Lumbago Recommend water aerobics to ease pain and improve joint health. Continue to rest as needed when back pain flares.  Depression, recurrent (HCC) Stable on Paroxetine 40mg  daily. Still has not established with therapist or taken advantage of Hospice counselors-encouraged to do so.  Chronic anticoagulation Stable on Xarelto 20mg  daily- A fib. Followed by cards.  Diarrhea Loose stools have resolved for weeks. Reviewed all recent stool specimen results with pt.  Elevated serum creatinine 05/25/17 Ref Range & Units 3wk ago   Glucose 65 - 99 mg/dL 161    BUN 8 - 27 mg/dL 14   Creatinine, Ser 0.96 - 1.00  mg/dL 0.45    GFR calc non Af Amer >59 mL/min/1.73 44    GFR calc Af Amer >59 mL/min/1.73 51    BUN/Creatinine Ratio 12 - 28 12    Mat creat is increase from her baseline, CMP drawn today.    FOLLOW-UP:  Return in about 6 months (around 12/21/2017).

## 2017-06-21 NOTE — Assessment & Plan Note (Signed)
Stable on Xarelto 20mg  daily- A fib. Followed by cards.

## 2017-06-22 ENCOUNTER — Telehealth: Payer: Self-pay

## 2017-06-22 LAB — CBC WITH DIFFERENTIAL/PLATELET

## 2017-06-22 LAB — COMPREHENSIVE METABOLIC PANEL
A/G RATIO: 1.3 (ref 1.2–2.2)
ALK PHOS: 74 IU/L (ref 39–117)
ALT: 7 IU/L (ref 0–32)
AST: 18 IU/L (ref 0–40)
Albumin: 4.1 g/dL (ref 3.5–4.7)
BILIRUBIN TOTAL: 0.5 mg/dL (ref 0.0–1.2)
BUN/Creatinine Ratio: 23 (ref 12–28)
BUN: 21 mg/dL (ref 8–27)
CALCIUM: 9.3 mg/dL (ref 8.7–10.3)
CHLORIDE: 103 mmol/L (ref 96–106)
CO2: 23 mmol/L (ref 20–29)
Creatinine, Ser: 0.91 mg/dL (ref 0.57–1.00)
GFR calc Af Amer: 67 mL/min/{1.73_m2} (ref >59)
GFR calc non Af Amer: 58 mL/min/{1.73_m2} — ABNORMAL LOW (ref >59)
Globulin, Total: 3.2 g/dL (ref 1.5–4.5)
Glucose: 98 mg/dL (ref 65–99)
POTASSIUM: 4.3 mmol/L (ref 3.5–5.2)
Sodium: 145 mmol/L — ABNORMAL HIGH (ref 134–144)
Total Protein: 7.3 g/dL (ref 6.0–8.5)

## 2017-06-22 NOTE — Telephone Encounter (Signed)
-----   Message from Julaine Fusi, NP sent at 06/22/2017 11:38 AM EDT ----- Good Morning, Can you please call Ms. Ludlam and share: Her CMP is much improved from the results a month ago, specifically her blood sugar 98, creat 0.91 (previoulsy 1.13) and LFTS AST 18, ALT 17. Please encourage her to continue with her healthy eating, water drinking and sticking to 4 cocktails a week max. Follow-up in 6 months, sooner if needed. Thanks! Orpha Bur

## 2017-06-22 NOTE — Telephone Encounter (Signed)
Left message for patient to call office to discuss lab results and recommendations. 

## 2017-06-22 NOTE — Telephone Encounter (Signed)
Informed patient of lab results and recommendations. °

## 2017-06-22 NOTE — Telephone Encounter (Signed)
-----   Message from [name] Danford, NP sent at 06/22/2017 11:38 AM EDT ----- Good Morning, Can you please call [name] and share: Her CMP is much improved from the results a month ago, specifically her blood sugar 98, creat 0.91 (previoulsy 1.13) and LFTS AST 18, ALT 17. Please encourage her to continue with her healthy eating, water drinking and sticking to 4 cocktails a week max. Follow-up in 6 months, sooner if needed. Thanks! Katy 

## 2017-07-04 ENCOUNTER — Telehealth: Payer: Self-pay | Admitting: Cardiology

## 2017-07-04 NOTE — Telephone Encounter (Signed)
Spoke with pt who is c/o edema in her feet.  This has been occuring since March per pt report.  No other s/s.  Advised to wear knee high compression hose daily.  Put on in the AM and take off bedtime.   To keep legs elevated as much as possible and to avoid foods high in NA+.  Pt states understanding but requests an appt to be seen.  She requested to see Tereso Newcomer, PA.  appt scheduled.  Pt to call back prior to if edema worsens.

## 2017-07-04 NOTE — Telephone Encounter (Signed)
Pt c/o swelling: STAT is pt has developed SOB within 24 hours  1. How long have you been experiencing swelling? 3 months 2. Where is the swelling located? feet  3.  Are you currently taking a "fluid pill"? Unknown   4.  Are you currently SOB? no  5.  Have you traveled recently? no

## 2017-07-11 ENCOUNTER — Other Ambulatory Visit: Payer: Self-pay

## 2017-07-11 MED ORDER — PAROXETINE HCL 40 MG PO TABS: 40.0000 mg | ORAL_TABLET | Freq: Every day | ORAL | 0 refills | Status: DC

## 2017-07-11 NOTE — Telephone Encounter (Signed)
Will not RF until OV

## 2017-07-11 NOTE — Telephone Encounter (Signed)
Refill request from [facility] for Paxil 40mg  daily.  Please advise.

## 2017-07-12 NOTE — Telephone Encounter (Signed)
Paxil was sent to pharmacy in error.  Left message informing pharmacy patient should not get refill until she is seen in office.

## 2017-07-15 ENCOUNTER — Telehealth: Payer: Self-pay | Admitting: Adult Health

## 2017-07-15 NOTE — Telephone Encounter (Signed)
Pt came in due to Pharmacy refusal to fill Rx PARoxetine/Paxil 40 MG tabletts(takes 1 daily)---Pt states is completely out of meds --Pt did make  appt for Monday 7/23 w/ provider and wonders if some( Rx tablets) can be called into Pharmacy til appt. w/ doctor. --glh

## 2017-07-15 NOTE — Telephone Encounter (Signed)
Spoke to Pharmacy patient was given enough tablets until her appointment on Monday 07/18/2017. MPulliam, CMA/RT(R)

## 2017-07-18 ENCOUNTER — Encounter: Payer: Medicare PPO | Admitting: Adult Health

## 2017-07-19 ENCOUNTER — Other Ambulatory Visit: Payer: Self-pay

## 2017-07-19 DIAGNOSIS — E876 Hypokalemia: Secondary | ICD-10-CM

## 2017-07-19 MED ORDER — POTASSIUM CHLORIDE ER 10 MEQ PO TBCR: 10.0000 meq | EXTENDED_RELEASE_TABLET | Freq: Every day | ORAL | 0 refills | Status: DC

## 2017-08-01 ENCOUNTER — Ambulatory Visit: Payer: Medicare PPO | Admitting: Physician Assistant

## 2017-08-01 ENCOUNTER — Other Ambulatory Visit: Payer: Self-pay

## 2017-08-01 MED ORDER — AMLODIPINE BESYLATE 10 MG PO TABS: 10.0000 mg | ORAL_TABLET | Freq: Every day | ORAL | 3 refills | Status: DC

## 2017-08-01 NOTE — Telephone Encounter (Signed)
We have not prescribed these medications for the patient previously.  Please review and refill if appropriate.  T. Ekaterina Denise, CMA  

## 2017-08-04 ENCOUNTER — Telehealth: Payer: Self-pay | Admitting: Adult Health

## 2017-08-04 MED ORDER — AMLODIPINE BESYLATE 10 MG PO TABS: 10.0000 mg | ORAL_TABLET | Freq: Every day | ORAL | 3 refills | Status: DC

## 2017-08-04 NOTE — Addendum Note (Signed)
Addended by: Stan Head on: 08/04/2017 02:12 PM   Modules accepted: Orders

## 2017-08-04 NOTE — Telephone Encounter (Signed)
Refill sent on 08/01/17 was marked "no print" and therefore was not received by pharmacy.  Refill sent to pharmacy today.  Tiajuana Amass, CMA

## 2017-08-04 NOTE — Telephone Encounter (Signed)
Patient is requesting a refill of her amlodipine sent to [facility]

## 2017-08-09 ENCOUNTER — Other Ambulatory Visit: Payer: Self-pay

## 2017-08-09 MED ORDER — SIMVASTATIN 5 MG PO TABS: 5.0000 mg | ORAL_TABLET | Freq: Every evening | ORAL | 0 refills | Status: DC

## 2017-08-09 NOTE — Telephone Encounter (Signed)
We have not prescribed these medications for the patient previously.  Please review and refill if appropriate.  T. Nelson, CMA  

## 2017-08-16 ENCOUNTER — Other Ambulatory Visit: Payer: Self-pay | Admitting: Physician Assistant

## 2017-08-16 ENCOUNTER — Telehealth: Payer: Self-pay | Admitting: *Deleted

## 2017-08-16 DIAGNOSIS — Z7901 Long term (current) use of anticoagulants: Secondary | ICD-10-CM

## 2017-08-16 MED ORDER — RIVAROXABAN 15 MG PO TABS: 15.0000 mg | ORAL_TABLET | Freq: Every day | ORAL | 0 refills | Status: DC

## 2017-08-16 NOTE — Telephone Encounter (Signed)
-----   Message from Jake Bathe, MD sent at 08/16/2017  1:08 PM EDT ----- Regarding: RE: Xarelto- Creatine clearance  Yes, let's decrease her dose especially given her advanced age. Thank you Donato Schultz, MD  ----- Message ----- From: Raul Del, RN Sent: 08/16/2017  10:51 AM To: Jake Bathe, MD Subject: Xarelto- Creatine clearance                    Pts. Creatine clearance is currently 47.24 mL/min from SCr used  from 06/21/17 labs. Should I decrease her Xarelto dose to 15mg  QD? Please advise.

## 2017-08-16 NOTE — Telephone Encounter (Signed)
Rx sent for 15mg  Xarelto

## 2017-09-06 ENCOUNTER — Other Ambulatory Visit: Payer: Self-pay | Admitting: Cardiology

## 2017-09-06 ENCOUNTER — Other Ambulatory Visit: Payer: Self-pay

## 2017-09-06 DIAGNOSIS — E876 Hypokalemia: Secondary | ICD-10-CM

## 2017-09-06 MED ORDER — SIMVASTATIN 5 MG PO TABS: 5.0000 mg | ORAL_TABLET | Freq: Every evening | ORAL | 0 refills | Status: DC

## 2017-09-06 MED ORDER — POTASSIUM CHLORIDE ER 10 MEQ PO TBCR: 10.0000 meq | EXTENDED_RELEASE_TABLET | Freq: Every day | ORAL | 0 refills | Status: DC

## 2017-09-06 MED ORDER — AMLODIPINE BESYLATE 10 MG PO TABS: 10.0000 mg | ORAL_TABLET | Freq: Every day | ORAL | 3 refills | Status: DC

## 2017-09-06 MED ORDER — QUINAPRIL HCL 40 MG PO TABS: 40.0000 mg | ORAL_TABLET | Freq: Every day | ORAL | 0 refills | Status: DC

## 2017-09-06 MED ORDER — LEVOTHYROXINE SODIUM 75 MCG PO TABS: 75.0000 ug | ORAL_TABLET | Freq: Every day | ORAL | 0 refills | Status: DC

## 2017-09-06 MED ORDER — PAROXETINE HCL 40 MG PO TABS: 40.0000 mg | ORAL_TABLET | Freq: Every day | ORAL | 0 refills | Status: DC

## 2017-09-06 MED ORDER — OMEPRAZOLE 20 MG PO CPDR: 20.0000 mg | DELAYED_RELEASE_CAPSULE | Freq: Every day | ORAL | 0 refills | Status: DC | PRN

## 2017-09-06 MED ORDER — DOXAZOSIN MESYLATE 8 MG PO TABS: 8.0000 mg | ORAL_TABLET | Freq: Every day | ORAL | 0 refills | Status: DC

## 2017-09-06 NOTE — Telephone Encounter (Signed)
Received fax from [facility] pharmacy stating that the patient is switching all of her medications to them and they need new RXs.  Medications refilled that we have prescribed. Xarelto and metoprolol are prescribed by Dr. Anne Fu, cardiology.  Informed pharmacy they are to request these refills from Dr. Anne Fu.  Tiajuana Amass, CMA

## 2017-09-06 NOTE — Telephone Encounter (Signed)
Pt last saw Dr Anne Fu 03/22/17, last labs 06/21/17 Creat 0.91, age 83, weight 66.2kg, CrCl 47.24, based on CrCl pt is on appropriate dosage of Xarelto 15mg  QD.  Will refill rx.

## 2017-09-07 ENCOUNTER — Other Ambulatory Visit: Payer: Self-pay

## 2017-09-07 LAB — HM MAMMOGRAPHY

## 2017-09-07 MED ORDER — METOPROLOL SUCCINATE ER 25 MG PO TB24: 25.0000 mg | ORAL_TABLET | Freq: Every day | ORAL | 3 refills | Status: DC

## 2017-09-07 NOTE — Addendum Note (Signed)
Addended by: Briscoe Deutscher R on: 09/07/2017 03:30 PM   Modules accepted: Orders

## 2017-09-19 ENCOUNTER — Telehealth: Payer: Self-pay | Admitting: Adult Health

## 2017-09-19 NOTE — Telephone Encounter (Signed)
Patient's daughter called on behalf of patient stating they never saw the results of the stool culture from June (it looks like it was put into MyChart but they do not really use that app). They want to speak with someone about the results of that culture and have some questions about her current bowel issues

## 2017-09-19 NOTE — Telephone Encounter (Signed)
Ms. Dorothey Baseman, pt's daughter, informed of results.  She states that the patient is again having problems with diarrhea.  Offered referral to GI, but daughter states that she will discuss with her mother and let us know if she does.  Tiajuana Amass, CMA

## 2017-10-04 ENCOUNTER — Ambulatory Visit (INDEPENDENT_AMBULATORY_CARE_PROVIDER_SITE_OTHER): Payer: Medicare PPO

## 2017-10-04 VITALS — Temp 98.9°F

## 2017-10-04 DIAGNOSIS — Z23 Encounter for immunization: Secondary | ICD-10-CM

## 2017-10-12 ENCOUNTER — Encounter: Payer: Self-pay | Admitting: Cardiology

## 2017-10-19 ENCOUNTER — Other Ambulatory Visit: Payer: Self-pay | Admitting: Adult Health

## 2017-10-19 DIAGNOSIS — E876 Hypokalemia: Secondary | ICD-10-CM

## 2017-10-26 ENCOUNTER — Encounter: Payer: Self-pay | Admitting: Cardiology

## 2017-10-26 ENCOUNTER — Ambulatory Visit (INDEPENDENT_AMBULATORY_CARE_PROVIDER_SITE_OTHER): Payer: Medicare PPO | Admitting: Cardiology

## 2017-10-26 VITALS — BP 118/68 | HR 64 | Ht 63.0 in | Wt 157.4 lb

## 2017-10-26 DIAGNOSIS — I10 Essential (primary) hypertension: Secondary | ICD-10-CM | POA: Diagnosis not present

## 2017-10-26 DIAGNOSIS — Z7901 Long term (current) use of anticoagulants: Secondary | ICD-10-CM

## 2017-10-26 DIAGNOSIS — I480 Paroxysmal atrial fibrillation: Secondary | ICD-10-CM | POA: Diagnosis not present

## 2017-10-26 DIAGNOSIS — Z79899 Other long term (current) drug therapy: Secondary | ICD-10-CM | POA: Diagnosis not present

## 2017-10-26 NOTE — Progress Notes (Signed)
.     Cardiology Office Note   Date:  10/26/2017   ID:  Christina Ortiz, DOB 09/21/1929, MRN [id number]  PCP:  Christina Fusi, NP  Cardiologist:   Christina Schultz, MD       History of Present Illness: Christina Ortiz is a 83 y.o. female who presents for her for follow-up history of CAD, 2 previous stents RCA and LAD paroxysmal atrial fibrillation.  She is moved from [facility]. I reviewed medical records from [facility], [facility]. She has a history of percutaneous intervention in 2006. Chronic smoker. No prior stroke or valve-like symptoms. LDL 05/28/14 was 70.  Echocardiogram on 01/22/14 showed moderate LVH, EF 55-60%, diastolic dysfunction, mild aortic stenosis, no pulmonary hypertension. Aortic valve area was 2.06 cm.  Prior EKG on 11/11/11 showed poor R-wave progression, low voltage. She is also had intermittent Mobitz type I second-degree AV block.  She had nuclear stress test on 11/15/11 that showed no definite evidence of ischemia or scar. Regional wall motion was normal.  Chest x-ray on 03/13/12 showed stable findings, hiatal hernia noted.  For her paroxysmal atrial fibrillation, she has been taking Xarelto 15 mg once a day. Metoprolol extended release 25 mg once a day, some he restarted.  Previously, she has had some challenges with control of her hypertension. Hopefully the Toprol 25 mg 1 help with this. We had to stop her amlodipine in the past because of hypotension. She continues to struggle at times with binge drinking alcohol. Unfortunately 2 months ago she lost her husband. She was partying quite a bit in [place] with her brother. She had an episode last weekend of binge drinking and felt her heart rate racing in the early morning after this.  Christina Ortiz daughter I take car of her.  10/26/17-she has had some diarrhea recently.  This may go along with a drinking binge.  No chest pain, no shortness of breath, no syncope, no bleeding.  No  melena.   Past Medical History:  Diagnosis Date  . Arthritis   . CKD (chronic kidney disease)    Xarelto dose is 15 mg QD  . Coronary artery disease    a. s/p PCI/stenting to RCA and LAD ([facility] in [place]) in 2006; b. Nuc 1/15: No ischemia, EF 70%  . Depression   . Gastritis   . History of cholecystectomy   . History of echocardiogram    a. Echo 1/15: Moderate LVH, EF 55-60%, impaired relaxation, mild AS  . Hyperlipidemia   . Hypertension   . Hypokalemia   . Mobitz type 1 second degree atrioventricular block    a. Event Monitor 3/15: NSR, first-degree AV block, second-degree AB block - Mobitz 1, PACs, NSVT (5 beats)  . PAF (paroxysmal atrial fibrillation) (HCC)   . Thyroid disease     Past Surgical History:  Procedure Laterality Date  . BREAST LUMPECTOMY Left   . CARDIAC CATHETERIZATION    . CATARACT EXTRACTION Bilateral   . CHOLECYSTECTOMY    . DILATION AND CURETTAGE OF UTERUS    . EYE SURGERY       Current Outpatient Prescriptions  Medication Sig Dispense Refill  . amLODipine (NORVASC) 10 MG tablet Take 1 tablet (10 mg total) by mouth daily. 90 tablet 3  . doxazosin (CARDURA) 8 MG tablet Take 1 tablet (8 mg total) by mouth daily. 90 tablet 0  . levothyroxine (SYNTHROID, LEVOTHROID) 75 MCG tablet Take 1 tablet (75 mcg total) by mouth daily before breakfast.  90 tablet 0  . metoprolol succinate (TOPROL-XL) 25 MG 24 hr tablet Take 1 tablet (25 mg total) by mouth daily. 30 tablet 3  . Multiple Vitamins-Minerals (ICAPS AREDS 2 PO) Take by mouth daily.    Marland Kitchen omeprazole (PRILOSEC) 20 MG capsule Take 1 capsule (20 mg total) by mouth daily as needed (heartburn). 90 capsule 0  . PARoxetine (PAXIL) 40 MG tablet Take 1 tablet (40 mg total) by mouth daily. 90 tablet 0  . potassium chloride (K-DUR) 10 MEQ tablet Take 1 tablet (10 mEq total) by mouth daily. 90 tablet 0  . quinapril (ACCUPRIL) 40 MG tablet Take 1 tablet (40 mg total) by mouth daily. 90 tablet 0  . simvastatin  (ZOCOR) 5 MG tablet Take 1 tablet (5 mg total) by mouth every evening. 90 tablet 0  . XARELTO 15 MG TABS tablet TAKE 1 TABLET BY MOUTH EVERY EVENING 30 tablet 6   No current facility-administered medications for this visit.     Allergies:   Tape    Social History:  The patient  reports that she quit smoking about 2 years ago. Her smoking use included Cigarettes. She has a 33.50 pack-year smoking history. She has never used smokeless tobacco. She reports that she drinks about 6.0 oz of alcohol per week . She reports that she does not use drugs.   Family History:  The patient's family history includes Cancer in her father; Heart attack in her mother; Heart disease in her mother; Lung cancer in her father. her mother had valvular heart disease age 11.   ROS:  Please see the history of present illness.   Otherwise, review of systems are positive for none.   All other systems are reviewed and negative.    PHYSICAL EXAM: VS:  BP 118/68   Pulse 64   Ht 5\' 3"  (1.6 m)   Wt 157 lb 6.4 oz (71.4 kg)   SpO2 93%   BMI 27.88 kg/m  , BMI Body mass index is 27.88 kg/m.  GEN: Well nourished, well developed, in no acute distress  HEENT: normal  Neck: no JVD, carotid bruits, or masses Cardiac: RRR; 1/6 murmur, no rubs, or gallops,no edema  Respiratory:  clear to auscultation bilaterally, normal work of breathing GI: soft, nontender, nondistended, + BS MS: no deformity or atrophy  Skin: warm and dry, no rash Neuro:  Alert and Oriented x 3, Strength and sensation are intact Psych: euthymic mood, full affect    EKG:   The ekg ordered today demonstrates 04/04/15-sinus bradycardia rate 59 with old septal infarct pattern, poor R-wave progression area no significant change from prior EKG.   Recent Labs: 01/22/2017: TSH 1.276 06/21/2017: ALT 7; BUN 21; Creatinine, Ser 0.91; Hemoglobin CANCELED; Platelets CANCELED; Potassium 4.3; Sodium 145    Lipid Panel    Component Value Date/Time   CHOL 147  09/06/2016   TRIG 95 09/06/2016   HDL 59 09/06/2016   LDLCALC 69 09/06/2016     LDL on 05/28/14 was 70, HDL 63, triglycerides 76    Wt Readings from Last 3 Encounters:  10/26/17 157 lb 6.4 oz (71.4 kg)  06/21/17 146 lb (66.2 kg)  05/25/17 144 lb 3.2 oz (65.4 kg)      Other studies Reviewed: Additional studies/ records that were reviewed today include: Prior office records from [place] reviewed, lab work reviewed, stress test reviewed.. Review of the above records demonstrates: As above   ASSESSMENT AND PLAN:  1. Paroxysmal atrial fibrillation-she is currently on Xarelto  15 mg once a day. This was dose adjusted because of decreased creatinine clearance at one point. We will continue. She has not had any bleeding issues. I have discontinued her aspirin 81 mg since she is taking Xarelto to help reduce overall bleeding risks for the future. She is asymptomatic with her atrial fibrillation. Continue with current medications. She did think that an urgent care doctor told her that she was in atrial fibrillation after a weekend drinking binge with vodka. Overall asymptomatic.She thinks once again that she may have had atrial fibrillation with heart rate in the 120s after binge drinking.  Continue to encourage alcohol cessation.  We are worried about hypokalemia that can occur during drinking.   2. Chronic anticoagulation-Xarelto 15 mg once a day. Every 6 months she should have a CBC and creatinine checked.These have been stable.  We are going to check again today.  3. Coronary artery disease-prior LAD and RCA stent placed in 2006.  Prior nuclear stress test 2012 reassuring with no ischemia. No anginal symptoms. Stable.  4. Essential hypertension-She has to go back on her full dose Toprol 25 mg. This helps her with her blood pressure. I'm fine with this.  5. Former smoking-she has quit in 2015. She is maintaining tobacco cessation.  No changes.  6. Hyperlipidemia-continue with low-dose  simvastatin. Could not tolerate higher doses.  Continue with current plan.   Current medicines are reviewed at length with the patient today.  The patient does not have concerns regarding medicines.  The following changes have been made: Checking blood work.  Labs/ tests ordered today include:   Orders Placed This Encounter  Procedures  . CBC  . Basic metabolic panel     Disposition:   Six-month follow-up. Signed, Christina SchultzMark Kamarah Bilotta, MD  10/26/2017 4:10 PM    [facility] [address], [place], [place]  [address] Phone: [phone]; Fax: [phone]) [phone]

## 2017-10-26 NOTE — Patient Instructions (Signed)
Medication Instructions:  The current medical regimen is effective;  continue present plan and medications.  Labwork: Please have blood work today (BMP, CBC)  Follow-Up: Follow up in 6 months with Nada Boozer, NP.  You will receive a letter in the mail 2 months before you are due.  Please call us when you receive this letter to schedule your follow up appointment.  If you need a refill on your cardiac medications before your next appointment, please call your pharmacy.  Thank you for choosing [facility]!!

## 2017-10-27 DIAGNOSIS — J209 Acute bronchitis, unspecified: Secondary | ICD-10-CM

## 2017-10-27 HISTORY — DX: Acute bronchitis, unspecified: J20.9

## 2017-10-27 LAB — BASIC METABOLIC PANEL
BUN/Creatinine Ratio: 18 (ref 12–28)
BUN: 17 mg/dL (ref 8–27)
CALCIUM: 9.4 mg/dL (ref 8.7–10.3)
CHLORIDE: 103 mmol/L (ref 96–106)
CO2: 23 mmol/L (ref 20–29)
Creatinine, Ser: 0.94 mg/dL (ref 0.57–1.00)
GFR calc Af Amer: 64 mL/min/{1.73_m2} (ref >59)
GFR calc non Af Amer: 55 mL/min/{1.73_m2} — ABNORMAL LOW (ref >59)
GLUCOSE: 114 mg/dL — AB (ref 65–99)
POTASSIUM: 3.9 mmol/L (ref 3.5–5.2)
Sodium: 143 mmol/L (ref 134–144)

## 2017-10-27 LAB — CBC
HEMOGLOBIN: 12.1 g/dL (ref 11.1–15.9)
Hematocrit: 36.7 % (ref 34.0–46.6)
MCH: 33.2 pg — AB (ref 26.6–33.0)
MCHC: 33 g/dL (ref 31.5–35.7)
MCV: 101 fL — ABNORMAL HIGH (ref 79–97)
PLATELETS: 229 10*3/uL (ref 150–379)
RBC: 3.65 x10E6/uL — ABNORMAL LOW (ref 3.77–5.28)
RDW: 13 % (ref 12.3–15.4)
WBC: 7.6 10*3/uL (ref 3.4–10.8)

## 2017-11-08 ENCOUNTER — Ambulatory Visit: Payer: Medicare PPO

## 2017-11-08 ENCOUNTER — Ambulatory Visit (INDEPENDENT_AMBULATORY_CARE_PROVIDER_SITE_OTHER): Payer: Medicare PPO | Admitting: Adult Health

## 2017-11-08 ENCOUNTER — Encounter: Payer: Self-pay | Admitting: Adult Health

## 2017-11-08 ENCOUNTER — Other Ambulatory Visit: Payer: Self-pay | Admitting: Cardiology

## 2017-11-08 VITALS — BP 132/85 | HR 102 | Temp 98.7°F | Ht 63.0 in | Wt 154.4 lb

## 2017-11-08 DIAGNOSIS — J069 Acute upper respiratory infection, unspecified: Secondary | ICD-10-CM

## 2017-11-08 DIAGNOSIS — R0600 Dyspnea, unspecified: Secondary | ICD-10-CM

## 2017-11-08 DIAGNOSIS — F1010 Alcohol abuse, uncomplicated: Secondary | ICD-10-CM

## 2017-11-08 MED ORDER — FLUTICASONE PROPIONATE 50 MCG/ACT NA SUSP: 2.0000 | Freq: Every day | NASAL | 6 refills | Status: DC

## 2017-11-08 MED ORDER — AZITHROMYCIN 250 MG PO TABS: ORAL_TABLET | ORAL | 0 refills | Status: DC

## 2017-11-08 MED ORDER — BENZONATATE 100 MG PO CAPS: 100.0000 mg | ORAL_CAPSULE | Freq: Two times a day (BID) | ORAL | 0 refills | Status: DC | PRN

## 2017-11-08 NOTE — Patient Instructions (Addendum)
Upper Respiratory Infection, Adult Most upper respiratory infections (URIs) are caused by a virus. A URI affects the nose, throat, and upper air passages. The most common type of URI is often called "the common cold." Follow these instructions at home:  Take medicines only as told by your doctor.  Gargle warm saltwater or take cough drops to comfort your throat as told by your doctor.  Use a warm mist humidifier or inhale steam from a shower to increase air moisture. This may make it easier to breathe.  Drink enough fluid to keep your pee (urine) clear or pale yellow.  Eat soups and other clear broths.  Have a healthy diet.  Rest as needed.  Go back to work when your fever is gone or your doctor says it is okay. ? You may need to stay home longer to avoid giving your URI to others. ? You can also wear a face mask and wash your hands often to prevent spread of the virus.  Use your inhaler more if you have asthma.  Do not use any tobacco products, including cigarettes, chewing tobacco, or electronic cigarettes. If you need help quitting, ask your doctor. Contact a doctor if:  You are getting worse, not better.  Your symptoms are not helped by medicine.  You have chills.  You are getting more short of breath.  You have brown or red mucus.  You have yellow or brown discharge from your nose.  You have pain in your face, especially when you bend forward.  You have a fever.  You have puffy (swollen) neck glands.  You have pain while swallowing.  You have white areas in the back of your throat. Get help right away if:  You have very bad or constant: ? Headache. ? Ear pain. ? Pain in your forehead, behind your eyes, and over your cheekbones (sinus pain). ? Chest pain.  You have long-lasting (chronic) lung disease and any of the following: ? Wheezing. ? Long-lasting cough. ? Coughing up blood. ? A change in your usual mucus.  You have a stiff neck.  You have  changes in your: ? Vision. ? Hearing. ? Thinking. ? Mood. This information is not intended to replace advice given to you by your health care provider. Make sure you discuss any questions you have with your health care provider. Document Released: 05/31/2008 Document Revised: 08/15/2016 Document Reviewed: 03/20/2014 Elsevier Interactive Patient Education  2018 Elsevier Inc.  Increase fluids/rest/vit c OTC Ibuprofen 400mg  every 8 hrs with food as needed for aches. No alcohol. Please return in one week for re-evaluation. FEEL BETTER!

## 2017-11-08 NOTE — Assessment & Plan Note (Addendum)
Increase fluids/rest/vit c OTC Ibuprofen 400mg  every 8 hrs with food as needed for aches. No alcohol. CXR- Chronic bronchitic changes. Increased interstitial markings bilaterally may reflect a pneumonitis type process or less likely pulmonary edema given the lack of significant pulmonary vascular congestion. Stable cardiomegaly.Thoracic aortic atherosclerosis. Please return in one week for re-evaluation.

## 2017-11-08 NOTE — Progress Notes (Signed)
Subjective:    Patient ID: Christina Ortiz, female    DOB: 01/14/1929, 83 y.o.   MRN: [id number]  HPI:  Ms. Christina Ortiz is here for nasal drainage (white/thick),constant productive cough (yellow mucus), fatigue, and mild nausea without vomiting for >1.5 weeks and it is worsening.  She was in [place] visiting family last week for 7 days.  She reports consuming >5 vodka drinks a day for over a week.  She denies CP/dyspnea at rest/palpitations.  She also reports tobacco use while in [place].  Her daughter (who is at [employer]) picked her up from the airport yesterday and noticed her not feeling well.    Patient Care Team    Relationship Specialty Notifications Start End  William Hamburgeranford, Breda Bond D, NP PCP - General Family Medicine  03/30/17   Jake BatheSkains, Mark C, MD Consulting Physician Cardiology  03/30/17   Mateo FlowHecker, Kathryn, MD Consulting Physician Ophthalmology  03/30/17   [facility]    03/30/17     Patient Active Problem List   Diagnosis Date Noted  . Dyspnea 11/08/2017  . Upper respiratory tract infection 11/08/2017  . Elevated serum creatinine 06/21/2017  . Edema of both ankles 06/21/2017  . Diarrhea 05/25/2017  . Hypokalemia 05/25/2017  . Alcohol abuse 05/25/2017  . Depression, recurrent (HCC) 03/30/2017  . Lumbago 03/30/2017  . Influenza with respiratory manifestation 01/22/2017  . Coronary artery disease due to lipid rich plaque 04/04/2015  . Essential hypertension 04/04/2015  . Hyperlipidemia 04/04/2015  . Former smoker 04/04/2015  . Chronic anticoagulation 04/04/2015     Past Medical History:  Diagnosis Date  . Arthritis   . CKD (chronic kidney disease)    Xarelto dose is 15 mg QD  . Coronary artery disease    a. s/p PCI/stenting to RCA and LAD ([facility] [place]) in 2006; b. Nuc 1/15: No ischemia, EF 70%  . Depression   . Gastritis   . History of cholecystectomy   . History of echocardiogram    a. Echo 1/15: Moderate LVH, EF 55-60%, impaired  relaxation, mild AS  . Hyperlipidemia   . Hypertension   . Hypokalemia   . Mobitz type 1 second degree atrioventricular block    a. Event Monitor 3/15: NSR, first-degree AV block, second-degree AB block - Mobitz 1, PACs, NSVT (5 beats)  . PAF (paroxysmal atrial fibrillation) (HCC)   . Thyroid disease      Past Surgical History:  Procedure Laterality Date  . BREAST LUMPECTOMY Left   . CARDIAC CATHETERIZATION    . CATARACT EXTRACTION Bilateral   . CHOLECYSTECTOMY    . DILATION AND CURETTAGE OF UTERUS    . EYE SURGERY       Family History  Problem Relation Age of Onset  . Heart disease Mother   . Heart attack Mother   . Lung cancer Father   . Cancer Father        lung     Social History   Substance and Sexual Activity  Drug Use No     Social History   Substance and Sexual Activity  Alcohol Use Yes  . Alcohol/week: 6.0 oz  . Types: 10 Shots of liquor per week     Social History   Tobacco Use  Smoking Status Former Smoker  . Packs/day: 0.50  . Years: 67.00  . Pack years: 33.50  . Types: Cigarettes  . Last attempt to quit: 10/25/2015  . Years since quitting: 2.0  Smokeless Tobacco Never Used  Tobacco Comment  quit 2015     Outpatient Encounter Medications as of 11/08/2017  Medication Sig  . amLODipine (NORVASC) 10 MG tablet Take 1 tablet (10 mg total) by mouth daily.  Marland Kitchen doxazosin (CARDURA) 8 MG tablet Take 1 tablet (8 mg total) by mouth daily.  Marland Kitchen levothyroxine (SYNTHROID, LEVOTHROID) 75 MCG tablet Take 1 tablet (75 mcg total) by mouth daily before breakfast.  . metoprolol succinate (TOPROL-XL) 25 MG 24 hr tablet Take 1 tablet (25 mg total) by mouth daily.  Marland Kitchen omeprazole (PRILOSEC) 20 MG capsule Take 1 capsule (20 mg total) by mouth daily as needed (heartburn).  Marland Kitchen PARoxetine (PAXIL) 40 MG tablet Take 1 tablet (40 mg total) by mouth daily.  . potassium chloride (K-DUR) 10 MEQ tablet Take 1 tablet (10 mEq total) by mouth daily.  . quinapril (ACCUPRIL)  40 MG tablet Take 1 tablet (40 mg total) by mouth daily.  . simvastatin (ZOCOR) 5 MG tablet Take 1 tablet (5 mg total) by mouth every evening.  [name] 15 MG TABS tablet TAKE 1 TABLET BY MOUTH EVERY EVENING  . azithromycin (ZITHROMAX) 250 MG tablet 2 tabs by mouth day one,  1 tabs days 2- 5  . benzonatate (TESSALON) 100 MG capsule Take 1 capsule (100 mg total) 2 (two) times daily as needed by mouth for cough.  . fluticasone (FLONASE) 50 MCG/ACT nasal spray Place 2 sprays daily into both nostrils.  . [DISCONTINUED] Multiple Vitamins-Minerals (ICAPS AREDS 2 PO) Take by mouth daily.   No facility-administered encounter medications on file as of 11/08/2017.     Allergies: Tape  Body mass index is 27.35 kg/m.  Blood pressure 132/85, pulse (!) 102, temperature 98.7 F (37.1 C), temperature source Oral, height 5\' 3"  (1.6 m), weight 154 lb 6.4 oz (70 kg), SpO2 95 %.   Review of Systems  Constitutional: Positive for activity change, appetite change, chills, fever and unexpected weight change. Negative for diaphoresis.  HENT: Positive for congestion.   Eyes: Negative for visual disturbance.  Respiratory: Positive for cough and shortness of breath. Negative for chest tightness, wheezing and stridor.   Cardiovascular: Negative for chest pain, palpitations and leg swelling.  Gastrointestinal: Positive for nausea. Negative for abdominal distention, abdominal pain, anal bleeding, blood in stool, constipation and diarrhea.  Endocrine: Positive for cold intolerance. Negative for heat intolerance, polydipsia, polyphagia and polyuria.  Genitourinary: Negative for difficulty urinating, flank pain and hematuria.  Neurological: Negative for dizziness and headaches.  Hematological: Bruises/bleeds easily.  Psychiatric/Behavioral: Positive for sleep disturbance.       Objective:   Physical Exam  Constitutional: She is oriented to person, place, and time. She appears well-developed and well-nourished.  No distress.  HENT:  Head: Normocephalic and atraumatic.  Right Ear: External ear and ear canal normal. Tympanic membrane is not erythematous and not bulging. Decreased hearing is noted.  Left Ear: External ear and ear canal normal. Tympanic membrane is not erythematous and not bulging. Decreased hearing is noted.  Nose: Mucosal edema and rhinorrhea present. Right sinus exhibits no maxillary sinus tenderness and no frontal sinus tenderness. Left sinus exhibits no maxillary sinus tenderness and no frontal sinus tenderness.  Mouth/Throat: Uvula is midline, oropharynx is clear and moist and mucous membranes are normal. Abnormal dentition.  Eyes: Conjunctivae are normal. Pupils are equal, round, and reactive to light.  Neck: Normal range of motion. Neck supple.  Cardiovascular: Normal rate, regular rhythm and intact distal pulses.  Murmur heard. Pulmonary/Chest: Effort normal. No respiratory distress. She has decreased breath  sounds in the right lower field and the left lower field. She has no wheezes. She has no rhonchi. She has no rales. She exhibits no tenderness.  Lymphadenopathy:    She has no cervical adenopathy.  Neurological: She is alert and oriented to person, place, and time.  Skin: Skin is warm and dry. No rash noted. She is not diaphoretic. No erythema. No pallor.  Psychiatric: She has a normal mood and affect. Her behavior is normal. Judgment and thought content normal.          Assessment & Plan:   1. Dyspnea, unspecified type   2. Alcohol abuse   3. Upper respiratory tract infection, unspecified type     Alcohol abuse Over the last week she consumed >5 vodka drinks/day while vacationing in [place].  Discussed complete abstinence of ETOH, strongly encouraged her to stop vodka consumption. Unable to obtain CBC/CMP during OV, r/t dehydration   Upper respiratory tract infection Increase fluids/rest/vit c OTC Ibuprofen 400mg  every 8 hrs with food as needed for  aches. No alcohol. CXR- Chronic bronchitic changes. Increased interstitial markings bilaterally may reflect a pneumonitis type process or less likely pulmonary edema given the lack of significant pulmonary vascular congestion. Stable cardiomegaly.Thoracic aortic atherosclerosis. Please return in one week for re-evaluation.    FOLLOW-UP:  Return in about 1 week (around 11/15/2017) for URI.

## 2017-11-08 NOTE — Assessment & Plan Note (Addendum)
Over the last week she consumed >5 vodka drinks/day while vacationing in [place].  Discussed complete abstinence of ETOH, strongly encouraged her to stop vodka consumption. Unable to obtain CBC/CMP during OV, r/t dehydration

## 2017-11-09 ENCOUNTER — Other Ambulatory Visit: Payer: Self-pay

## 2017-11-09 ENCOUNTER — Telehealth: Payer: Self-pay | Admitting: Adult Health

## 2017-11-09 ENCOUNTER — Emergency Department (HOSPITAL_COMMUNITY): Payer: Medicare PPO

## 2017-11-09 ENCOUNTER — Encounter (HOSPITAL_COMMUNITY): Payer: Self-pay | Admitting: *Deleted

## 2017-11-09 ENCOUNTER — Inpatient Hospital Stay (HOSPITAL_COMMUNITY)
Admission: EM | Admit: 2017-11-09 | Discharge: 2017-11-14 | DRG: 202 | Disposition: A | Discharge status: Home Health | Payer: Medicare PPO | Attending: Family Medicine | Admitting: Family Medicine

## 2017-11-09 DIAGNOSIS — J069 Acute upper respiratory infection, unspecified: Secondary | ICD-10-CM

## 2017-11-09 DIAGNOSIS — J205 Acute bronchitis due to respiratory syncytial virus: Principal | ICD-10-CM | POA: Diagnosis present

## 2017-11-09 DIAGNOSIS — I129 Hypertensive chronic kidney disease with stage 1 through stage 4 chronic kidney disease, or unspecified chronic kidney disease: Secondary | ICD-10-CM | POA: Diagnosis present

## 2017-11-09 DIAGNOSIS — E039 Hypothyroidism, unspecified: Secondary | ICD-10-CM | POA: Diagnosis present

## 2017-11-09 DIAGNOSIS — F1010 Alcohol abuse, uncomplicated: Secondary | ICD-10-CM | POA: Diagnosis present

## 2017-11-09 DIAGNOSIS — E785 Hyperlipidemia, unspecified: Secondary | ICD-10-CM | POA: Diagnosis present

## 2017-11-09 DIAGNOSIS — I2510 Atherosclerotic heart disease of native coronary artery without angina pectoris: Secondary | ICD-10-CM | POA: Diagnosis present

## 2017-11-09 DIAGNOSIS — N182 Chronic kidney disease, stage 2 (mild): Secondary | ICD-10-CM | POA: Diagnosis present

## 2017-11-09 DIAGNOSIS — R0902 Hypoxemia: Secondary | ICD-10-CM | POA: Diagnosis not present

## 2017-11-09 DIAGNOSIS — Z79899 Other long term (current) drug therapy: Secondary | ICD-10-CM

## 2017-11-09 DIAGNOSIS — I10 Essential (primary) hypertension: Secondary | ICD-10-CM | POA: Diagnosis not present

## 2017-11-09 DIAGNOSIS — J210 Acute bronchiolitis due to respiratory syncytial virus: Secondary | ICD-10-CM | POA: Diagnosis not present

## 2017-11-09 DIAGNOSIS — Z7901 Long term (current) use of anticoagulants: Secondary | ICD-10-CM

## 2017-11-09 DIAGNOSIS — Z79890 Hormone replacement therapy (postmenopausal): Secondary | ICD-10-CM

## 2017-11-09 DIAGNOSIS — J440 Chronic obstructive pulmonary disease with acute lower respiratory infection: Secondary | ICD-10-CM | POA: Diagnosis present

## 2017-11-09 DIAGNOSIS — D7589 Other specified diseases of blood and blood-forming organs: Secondary | ICD-10-CM | POA: Diagnosis present

## 2017-11-09 DIAGNOSIS — J209 Acute bronchitis, unspecified: Secondary | ICD-10-CM

## 2017-11-09 DIAGNOSIS — R0602 Shortness of breath: Secondary | ICD-10-CM | POA: Diagnosis not present

## 2017-11-09 DIAGNOSIS — I480 Paroxysmal atrial fibrillation: Secondary | ICD-10-CM | POA: Diagnosis present

## 2017-11-09 DIAGNOSIS — Z91048 Other nonmedicinal substance allergy status: Secondary | ICD-10-CM

## 2017-11-09 DIAGNOSIS — Z7951 Long term (current) use of inhaled steroids: Secondary | ICD-10-CM | POA: Diagnosis not present

## 2017-11-09 DIAGNOSIS — F329 Major depressive disorder, single episode, unspecified: Secondary | ICD-10-CM | POA: Diagnosis present

## 2017-11-09 DIAGNOSIS — J9600 Acute respiratory failure, unspecified whether with hypoxia or hypercapnia: Secondary | ICD-10-CM | POA: Diagnosis present

## 2017-11-09 DIAGNOSIS — Z955 Presence of coronary angioplasty implant and graft: Secondary | ICD-10-CM | POA: Diagnosis not present

## 2017-11-09 DIAGNOSIS — F17210 Nicotine dependence, cigarettes, uncomplicated: Secondary | ICD-10-CM | POA: Diagnosis present

## 2017-11-09 DIAGNOSIS — J441 Chronic obstructive pulmonary disease with (acute) exacerbation: Secondary | ICD-10-CM | POA: Diagnosis not present

## 2017-11-09 DIAGNOSIS — J9601 Acute respiratory failure with hypoxia: Secondary | ICD-10-CM | POA: Diagnosis present

## 2017-11-09 DIAGNOSIS — Z8249 Family history of ischemic heart disease and other diseases of the circulatory system: Secondary | ICD-10-CM

## 2017-11-09 HISTORY — DX: Acute bronchitis, unspecified: J20.9

## 2017-11-09 LAB — BASIC METABOLIC PANEL
Anion gap: 10 (ref 5–15)
BUN: 11 mg/dL (ref 6–20)
CALCIUM: 9.1 mg/dL (ref 8.9–10.3)
CO2: 30 mmol/L (ref 22–32)
CREATININE: 0.97 mg/dL (ref 0.44–1.00)
Chloride: 100 mmol/L — ABNORMAL LOW (ref 101–111)
GFR, EST AFRICAN AMERICAN: 60 mL/min — AB (ref >60)
GFR, EST NON AFRICAN AMERICAN: 51 mL/min — AB (ref >60)
Glucose, Bld: 147 mg/dL — ABNORMAL HIGH (ref 65–99)
Potassium: 3.3 mmol/L — ABNORMAL LOW (ref 3.5–5.1)
SODIUM: 140 mmol/L (ref 135–145)

## 2017-11-09 LAB — CBC
HCT: 39.3 % (ref 36.0–46.0)
HEMOGLOBIN: 12.7 g/dL (ref 12.0–15.0)
MCH: 33.2 pg (ref 26.0–34.0)
MCHC: 32.3 g/dL (ref 30.0–36.0)
MCV: 102.9 fL — ABNORMAL HIGH (ref 78.0–100.0)
Platelets: 157 10*3/uL (ref 150–400)
RBC: 3.82 MIL/uL — AB (ref 3.87–5.11)
RDW: 14.6 % (ref 11.5–15.5)
WBC: 6.5 10*3/uL (ref 4.0–10.5)

## 2017-11-09 LAB — PROCALCITONIN

## 2017-11-09 LAB — I-STAT TROPONIN, ED: TROPONIN I, POC: 0.01 ng/mL (ref 0.00–0.08)

## 2017-11-09 LAB — INFLUENZA PANEL BY PCR (TYPE A & B)
INFLBPCR: NEGATIVE
Influenza A By PCR: NEGATIVE

## 2017-11-09 LAB — BRAIN NATRIURETIC PEPTIDE: B Natriuretic Peptide: 392.7 pg/mL — ABNORMAL HIGH (ref 0.0–100.0)

## 2017-11-09 LAB — VITAMIN B12: Vitamin B-12: 532 pg/mL (ref 180–914)

## 2017-11-09 MED ORDER — POTASSIUM CHLORIDE CRYS ER 10 MEQ PO TBCR
10.0000 meq | EXTENDED_RELEASE_TABLET | Freq: Every day | ORAL | Status: DC
  Administered 2017-11-10 – 2017-11-14 (×5): 10 meq via ORAL
  Filled 2017-11-09 (×5): qty 1

## 2017-11-09 MED ORDER — RIVAROXABAN 15 MG PO TABS
15.0000 mg | ORAL_TABLET | Freq: Every evening | ORAL | Status: DC
  Administered 2017-11-09 – 2017-11-13 (×5): 15 mg via ORAL
  Filled 2017-11-09 (×5): qty 1

## 2017-11-09 MED ORDER — ONDANSETRON HCL 4 MG PO TABS: 4.0000 mg | ORAL_TABLET | Freq: Four times a day (QID) | ORAL | Status: DC | PRN

## 2017-11-09 MED ORDER — DEXTROSE 5 % IV SOLN
500.0000 mg | INTRAVENOUS | Status: DC
  Administered 2017-11-09: 500 mg via INTRAVENOUS
  Filled 2017-11-09 (×2): qty 500

## 2017-11-09 MED ORDER — ACETAMINOPHEN 650 MG RE SUPP: 650.0000 mg | Freq: Four times a day (QID) | RECTAL | Status: DC | PRN

## 2017-11-09 MED ORDER — ONDANSETRON HCL 4 MG/2ML IJ SOLN
4.0000 mg | Freq: Once | INTRAMUSCULAR | Status: AC
Start: 2017-11-09 — End: 2017-11-09
  Administered 2017-11-09: 4 mg via INTRAVENOUS
  Filled 2017-11-09: qty 2

## 2017-11-09 MED ORDER — SODIUM CHLORIDE 0.9 % IV BOLUS (SEPSIS)
1000.0000 mL | Freq: Once | INTRAVENOUS | Status: AC
  Administered 2017-11-09: 1000 mL via INTRAVENOUS

## 2017-11-09 MED ORDER — ONDANSETRON HCL 4 MG/2ML IJ SOLN: 4.0000 mg | Freq: Four times a day (QID) | INTRAMUSCULAR | Status: DC | PRN

## 2017-11-09 MED ORDER — CEFTRIAXONE SODIUM 1 G IJ SOLR: 1.0000 g | INTRAMUSCULAR | Status: DC

## 2017-11-09 MED ORDER — POTASSIUM CHLORIDE CRYS ER 20 MEQ PO TBCR
40.0000 meq | EXTENDED_RELEASE_TABLET | Freq: Once | ORAL | Status: AC
  Administered 2017-11-09: 40 meq via ORAL
  Filled 2017-11-09: qty 2

## 2017-11-09 MED ORDER — LEVALBUTEROL HCL 0.63 MG/3ML IN NEBU
0.6300 mg | INHALATION_SOLUTION | Freq: Three times a day (TID) | RESPIRATORY_TRACT | Status: DC
  Administered 2017-11-09 – 2017-11-10 (×2): 0.63 mg via RESPIRATORY_TRACT
  Filled 2017-11-09 (×2): qty 3

## 2017-11-09 MED ORDER — DEXTROSE 5 % IV SOLN
1.0000 g | Freq: Once | INTRAVENOUS | Status: AC
  Administered 2017-11-09: 1 g via INTRAVENOUS
  Filled 2017-11-09: qty 10

## 2017-11-09 MED ORDER — ADULT MULTIVITAMIN W/MINERALS CH
1.0000 | ORAL_TABLET | Freq: Every day | ORAL | Status: DC
  Administered 2017-11-09 – 2017-11-14 (×6): 1 via ORAL
  Filled 2017-11-09 (×6): qty 1

## 2017-11-09 MED ORDER — VITAMIN B-1 100 MG PO TABS
100.0000 mg | ORAL_TABLET | Freq: Every day | ORAL | Status: DC
  Administered 2017-11-09 – 2017-11-14 (×6): 100 mg via ORAL
  Filled 2017-11-09 (×6): qty 1

## 2017-11-09 MED ORDER — LEVOTHYROXINE SODIUM 75 MCG PO TABS
75.0000 ug | ORAL_TABLET | Freq: Every day | ORAL | Status: DC
  Administered 2017-11-10 – 2017-11-14 (×5): 75 ug via ORAL
  Filled 2017-11-09 (×5): qty 1

## 2017-11-09 MED ORDER — SODIUM CHLORIDE 0.9 % IV SOLN
INTRAVENOUS | Status: DC
  Administered 2017-11-09 – 2017-11-10 (×2): via INTRAVENOUS

## 2017-11-09 MED ORDER — DEXTROSE 5 % IV SOLN
1.0000 g | INTRAVENOUS | Status: DC
  Filled 2017-11-09: qty 10

## 2017-11-09 MED ORDER — DOXAZOSIN MESYLATE 8 MG PO TABS
8.0000 mg | ORAL_TABLET | Freq: Every day | ORAL | Status: DC
  Administered 2017-11-10 – 2017-11-14 (×5): 8 mg via ORAL
  Filled 2017-11-09 (×5): qty 1

## 2017-11-09 MED ORDER — FOLIC ACID 1 MG PO TABS
1.0000 mg | ORAL_TABLET | Freq: Every day | ORAL | Status: DC
  Administered 2017-11-09 – 2017-11-14 (×6): 1 mg via ORAL
  Filled 2017-11-09 (×6): qty 1

## 2017-11-09 MED ORDER — BUDESONIDE 0.25 MG/2ML IN SUSP
0.2500 mg | Freq: Two times a day (BID) | RESPIRATORY_TRACT | Status: DC
  Administered 2017-11-09 – 2017-11-11 (×4): 0.25 mg via RESPIRATORY_TRACT
  Filled 2017-11-09 (×4): qty 2

## 2017-11-09 MED ORDER — ACETAMINOPHEN 325 MG PO TABS
650.0000 mg | ORAL_TABLET | Freq: Four times a day (QID) | ORAL | Status: DC | PRN
  Filled 2017-11-09: qty 2

## 2017-11-09 MED ORDER — THIAMINE HCL 100 MG/ML IJ SOLN
100.0000 mg | Freq: Every day | INTRAMUSCULAR | Status: DC
  Filled 2017-11-09 (×3): qty 2

## 2017-11-09 MED ORDER — AMLODIPINE BESYLATE 10 MG PO TABS
10.0000 mg | ORAL_TABLET | Freq: Every day | ORAL | Status: DC
  Administered 2017-11-10 – 2017-11-14 (×5): 10 mg via ORAL
  Filled 2017-11-09 (×4): qty 1
  Filled 2017-11-09: qty 2

## 2017-11-09 MED ORDER — LEVALBUTEROL HCL 0.63 MG/3ML IN NEBU
INHALATION_SOLUTION | RESPIRATORY_TRACT | Status: AC
  Filled 2017-11-09: qty 3

## 2017-11-09 MED ORDER — CARBOXYMETHYLCELLUL-GLYCERIN 0.5-0.9 % OP SOLN: 2.0000 [drp] | OPHTHALMIC | Status: DC | PRN

## 2017-11-09 MED ORDER — SODIUM CHLORIDE 0.9% FLUSH
3.0000 mL | Freq: Two times a day (BID) | INTRAVENOUS | Status: DC
  Administered 2017-11-10 – 2017-11-14 (×9): 3 mL via INTRAVENOUS

## 2017-11-09 MED ORDER — METOPROLOL SUCCINATE ER 25 MG PO TB24
25.0000 mg | ORAL_TABLET | Freq: Every day | ORAL | Status: DC
  Administered 2017-11-10 – 2017-11-14 (×5): 25 mg via ORAL
  Filled 2017-11-09 (×5): qty 1

## 2017-11-09 MED ORDER — DEXAMETHASONE SODIUM PHOSPHATE 4 MG/ML IJ SOLN
8.0000 mg | Freq: Once | INTRAMUSCULAR | Status: AC
  Administered 2017-11-09: 8 mg via INTRAVENOUS
  Filled 2017-11-09: qty 2

## 2017-11-09 MED ORDER — LORAZEPAM 1 MG PO TABS
1.0000 mg | ORAL_TABLET | Freq: Four times a day (QID) | ORAL | Status: AC | PRN
Start: 2017-11-09 — End: 2017-11-12

## 2017-11-09 MED ORDER — IPRATROPIUM-ALBUTEROL 0.5-2.5 (3) MG/3ML IN SOLN
3.0000 mL | Freq: Once | RESPIRATORY_TRACT | Status: AC
  Administered 2017-11-09: 3 mL via RESPIRATORY_TRACT
  Filled 2017-11-09: qty 3

## 2017-11-09 MED ORDER — METHYLPREDNISOLONE SODIUM SUCC 125 MG IJ SOLR
60.0000 mg | Freq: Two times a day (BID) | INTRAMUSCULAR | Status: DC
  Administered 2017-11-09 – 2017-11-10 (×2): 60 mg via INTRAVENOUS
  Filled 2017-11-09 (×2): qty 2

## 2017-11-09 MED ORDER — PAROXETINE HCL 20 MG PO TABS
40.0000 mg | ORAL_TABLET | Freq: Every day | ORAL | Status: DC
  Administered 2017-11-10 – 2017-11-14 (×5): 40 mg via ORAL
  Filled 2017-11-09 (×5): qty 2

## 2017-11-09 MED ORDER — PANTOPRAZOLE SODIUM 40 MG PO TBEC
40.0000 mg | DELAYED_RELEASE_TABLET | Freq: Every day | ORAL | Status: DC
  Administered 2017-11-09 – 2017-11-14 (×6): 40 mg via ORAL
  Filled 2017-11-09 (×6): qty 1

## 2017-11-09 MED ORDER — FLUTICASONE PROPIONATE 50 MCG/ACT NA SUSP
2.0000 | Freq: Every day | NASAL | Status: DC
  Administered 2017-11-10 – 2017-11-14 (×5): 2 via NASAL
  Filled 2017-11-09: qty 16

## 2017-11-09 MED ORDER — LORAZEPAM 2 MG/ML IJ SOLN: 1.0000 mg | Freq: Four times a day (QID) | INTRAMUSCULAR | Status: AC | PRN

## 2017-11-09 NOTE — ED Triage Notes (Signed)
PT started on antibiotics yesterday for chest infection.  RR 30, breathing labored.  O2 sats of 94.

## 2017-11-09 NOTE — ED Provider Notes (Addendum)
[facility] EMERGENCY DEPARTMENT Provider Note   CSN: [id number] Arrival date & time: 11/09/17  1049     History   Chief Complaint Chief Complaint  Patient presents with  . Shortness of Breath  . Weakness    HPI Christina Ortiz is a 83 y.o. female.  Patient presents with cough, congestion, wheezing for several days.  She allegedly went to [place] recently to visit her brother and smoked cigarettes and drank alcohol.  She was seen by her primary care doctor yesterday and prescribed Zithromax.  She has taken a dose yesterday and today of same.  Her symptoms have worsened overnight.  She is extremely dyspneic with exertion.  Past medical history includes 2 different cardiac stenting procedures 8 years ago, atrial fibrillation, hypertension, hyperlipidemia, cigarette smoking.   Severity of symptoms is moderate to severe.  Exertion makes symptoms worse.      Past Medical History:  Diagnosis Date  . Arthritis   . CKD (chronic kidney disease)    Xarelto dose is 15 mg QD  . Coronary artery disease    a. s/p PCI/stenting to RCA and LAD ([facility] in [place]) in 2006; b. Nuc 1/15: No ischemia, EF 70%  . Depression   . Gastritis   . History of cholecystectomy   . History of echocardiogram    a. Echo 1/15: Moderate LVH, EF 55-60%, impaired relaxation, mild AS  . Hyperlipidemia   . Hypertension   . Hypokalemia   . Mobitz type 1 second degree atrioventricular block    a. Event Monitor 3/15: NSR, first-degree AV block, second-degree AB block - Mobitz 1, PACs, NSVT (5 beats)  . PAF (paroxysmal atrial fibrillation) (HCC)   . Thyroid disease     Patient Active Problem List   Diagnosis Date Noted  . Dyspnea 11/08/2017  . Upper respiratory tract infection 11/08/2017  . Elevated serum creatinine 06/21/2017  . Edema of both ankles 06/21/2017  . Diarrhea 05/25/2017  . Hypokalemia 05/25/2017  . Alcohol abuse 05/25/2017  . Depression, recurrent (HCC)  03/30/2017  . Lumbago 03/30/2017  . Influenza with respiratory manifestation 01/22/2017  . Coronary artery disease due to lipid rich plaque 04/04/2015  . Essential hypertension 04/04/2015  . Hyperlipidemia 04/04/2015  . Former smoker 04/04/2015  . Chronic anticoagulation 04/04/2015    Past Surgical History:  Procedure Laterality Date  . BREAST LUMPECTOMY Left   . CARDIAC CATHETERIZATION    . CATARACT EXTRACTION Bilateral   . CHOLECYSTECTOMY    . DILATION AND CURETTAGE OF UTERUS    . EYE SURGERY      OB History    No data available       Home Medications    Prior to Admission medications   Medication Sig Start Date End Date Taking? Authorizing Provider  amLODipine (NORVASC) 10 MG tablet Take 1 tablet (10 mg total) by mouth daily. 09/06/17  Yes Danford, Orpha Bur D, NP  azithromycin (ZITHROMAX) 250 MG tablet 2 tabs by mouth day one,  1 tabs days 2- 5 11/08/17  Yes Danford, Orpha Bur D, NP  benzonatate (TESSALON) 100 MG capsule Take 1 capsule (100 mg total) 2 (two) times daily as needed by mouth for cough. 11/08/17  Yes Danford, Orpha Bur D, NP  carboxymethylcellul-glycerin ([facility] LUBRICATING/DRY EYE) 0.5-0.9 % ophthalmic solution Place 2 drops as needed into both eyes for dry eyes.   Yes [provider]  doxazosin (CARDURA) 8 MG tablet Take 1 tablet (8 mg total) by mouth daily. 09/06/17  Yes Danford,  Jinny Blossom, NP  fluticasone (FLONASE) 50 MCG/ACT nasal spray Place 2 sprays daily into both nostrils. 11/08/17  Yes Danford, Orpha Bur D, NP  levothyroxine (SYNTHROID, LEVOTHROID) 75 MCG tablet Take 1 tablet (75 mcg total) by mouth daily before breakfast. 09/06/17  Yes Danford, Orpha Bur D, NP  metoprolol succinate (TOPROL-XL) 25 MG 24 hr tablet Take 1 tablet (25 mg total) by mouth daily. 09/07/17  Yes Jake Bathe, MD  omeprazole (PRILOSEC) 20 MG capsule Take 1 capsule (20 mg total) by mouth daily as needed (heartburn). 09/06/17  Yes Danford, Orpha Bur D, NP  PARoxetine (PAXIL) 40 MG tablet Take 1 tablet (40 mg  total) by mouth daily. 09/06/17  Yes Danford, Orpha Bur D, NP  potassium chloride (K-DUR) 10 MEQ tablet Take 1 tablet (10 mEq total) by mouth daily. 09/06/17  Yes Danford, Orpha Bur D, NP  quinapril (ACCUPRIL) 40 MG tablet Take 1 tablet (40 mg total) by mouth daily. 09/06/17  Yes Danford, Orpha Bur D, NP  simvastatin (ZOCOR) 5 MG tablet Take 1 tablet (5 mg total) by mouth every evening. 09/06/17  Yes Danford, [name], NP  XARELTO 15 MG TABS tablet TAKE 1 TABLET (15 MG TOTAL) BY MOUTH DAILY WITH SUPPER. 11/08/17  Yes Skains, Veverly Fells, MD  XARELTO 15 MG TABS tablet TAKE 1 TABLET BY MOUTH EVERY EVENING Patient not taking: Reported on 11/09/2017 09/06/17   Jake Bathe, MD    Family History Family History  Problem Relation Age of Onset  . Heart disease Mother   . Heart attack Mother   . Lung cancer Father   . Cancer Father        lung    Social History Social History   Tobacco Use  . Smoking status: Former Smoker    Packs/day: 0.50    Years: 67.00    Pack years: 33.50    Types: Cigarettes    Last attempt to quit: 10/25/2015    Years since quitting: 2.0  . Smokeless tobacco: Never Used  . Tobacco comment: quit 2015  Substance Use Topics  . Alcohol use: Yes    Alcohol/week: 6.0 oz    Types: 10 Shots of liquor per week  . Drug use: No     Allergies   Tape   Review of Systems Review of Systems  All other systems reviewed and are negative.    Physical Exam Updated Vital Signs BP 112/70   Pulse (!) 37   Temp 97.6 F (36.4 C) (Oral)   Resp (!) 28   Ht  (1.6 m)   Wt 69.9 kg (154 lb)   SpO2 94%   BMI 27.28 kg/m   Physical Exam  Constitutional: She is oriented to person, place, and time. She appears well-developed and well-nourished.  HENT:  Head: Normocephalic and atraumatic.  Eyes: Conjunctivae are normal.  Neck: Neck supple.  Cardiovascular: Normal rate and regular rhythm.  Pulmonary/Chest: Effort normal.  Bilateral wheezing and rhonchi  Abdominal: Soft. Bowel sounds are  normal.  Musculoskeletal: Normal range of motion.  Neurological: She is alert and oriented to person, place, and time.  Skin: Skin is warm and dry.  Psychiatric: She has a normal mood and affect. Her behavior is normal.  Nursing note and vitals reviewed.    ED Treatments / Results  Labs (all labs ordered are listed, but only abnormal results are displayed) Labs Reviewed  BASIC METABOLIC PANEL - Abnormal; Notable for the following components:      Result Value   Potassium 3.3 (*)  Chloride 100 (*)    Glucose, Bld 147 (*)    GFR calc non Af Amer 51 (*)    GFR calc Af Amer 60 (*)    All other components within normal limits  CBC - Abnormal; Notable for the following components:   RBC 3.82 (*)    MCV 102.9 (*)    All other components within normal limits  BRAIN NATRIURETIC PEPTIDE - Abnormal; Notable for the following components:   B Natriuretic Peptide 392.7 (*)    All other components within normal limits  I-STAT TROPONIN, ED    EKG  EKG Interpretation  Date/Time:  Wednesday November 09 2017 11:09:30 EST Ventricular Rate:  71 PR Interval:    QRS Duration: 70 QT Interval:  414 QTC Calculation: 449 R Axis:   134 Text Interpretation:  Atrial fibrillation Right axis deviation Low voltage QRS Cannot rule out Anteroseptal infarct , age undetermined Abnormal ECG Confirmed by Donnetta Hutching ([id number]) on 11/09/2017 1:56:38 PM       Radiology Dg Chest 2 View  Result Date: 11/09/2017 CLINICAL DATA:  Cough and wheezing for 1 week.  History smoking. EXAM: CHEST  2 VIEW COMPARISON:  11/08/2017. FINDINGS: The cardiac silhouette remains prominently enlarged. There is also evidence of a sizable hiatal hernia. Aortic atherosclerosis is noted. There is mild chronic peribronchial thickening. No confluent airspace opacity, overt edema, or pneumothorax is identified. No acute osseous abnormality is identified. IMPRESSION: 1. Chronic bronchitic changes without evidence of acute airspace  disease. 2. Cardiomegaly and hiatal hernia. Electronically Signed   By: Sebastian Ache M.D.   On: 11/09/2017 15:24   Dg Chest 2 View  Result Date: 11/09/2017 CLINICAL DATA:  Upper respiratory tract infection, dyspnea, history of coronary artery disease and chronic renal insufficiency, former smoker. EXAM: CHEST  2 VIEW COMPARISON:  PA and lateral chest x-ray of January 22, 2017 FINDINGS: The lungs are well-expanded with hemidiaphragm flattening. The interstitial markings are increased bilaterally. The cardiac silhouette is enlarged and stable. The left lateral costophrenic angle is blunted as is the right but this is stable. The pulmonary vascularity is not clearly engorged. There is calcification in the wall of the aortic arch. The observed bony thorax exhibits no acute abnormality. IMPRESSION: Chronic bronchitic changes. Increased interstitial markings bilaterally may reflect a pneumonitis type process or less likely pulmonary edema given the lack of significant pulmonary vascular congestion. Stable cardiomegaly. Thoracic aortic atherosclerosis. Electronically Signed   By: David  [place] M.D.   On: 11/09/2017 07:46    Procedures Procedures (including critical care time)  Medications Ordered in ED Medications  ipratropium-albuterol (DUONEB) 0.5-2.5 (3) MG/3ML nebulizer solution 3 mL (not administered)  sodium chloride 0.9 % bolus 1,000 mL (0 mLs Intravenous Stopped 11/09/17 1432)  ipratropium-albuterol (DUONEB) 0.5-2.5 (3) MG/3ML nebulizer solution 3 mL (3 mLs Nebulization Given 11/09/17 1348)  ondansetron (ZOFRAN) injection 4 mg (4 mg Intravenous Given 11/09/17 1342)  cefTRIAXone (ROCEPHIN) 1 g in dextrose 5 % 50 mL IVPB (0 g Intravenous Stopped 11/09/17 1416)  dexamethasone (DECADRON) injection 8 mg (8 mg Intravenous Given 11/09/17 1343)     Initial Impression / Assessment and Plan / ED Course  I have reviewed the triage vital signs and the nursing notes.  Pertinent labs & imaging results  that were available during my care of the patient were reviewed by me and considered in my medical decision making (see chart for details).    Patient presents with persistent URI symptoms with associated coughing, wheezing.  She was started  on Zithromax yesterday.  Chest x-ray shows no lobar pneumonia.  She has been given IV steroids, nebulizer treatment, IV Rocephin.  However, she desaturates into the low 80s off from oxygen.  Will admit to general medicine.   Final Clinical Impressions(s) / ED Diagnoses   Final diagnoses:  Upper respiratory tract infection, unspecified type  Hypoxemia    ED Discharge Orders    None       Donnetta Hutching, MD 11/09/17 1559    Donnetta Hutching, MD 11/09/17 [phone]

## 2017-11-09 NOTE — ED Notes (Signed)
ED Provider at bedside. 

## 2017-11-09 NOTE — Telephone Encounter (Signed)
Spoke with pt's daughter.  Pt denies neck pain, arm pain, diapharesis, nausea or vomiting, or the sensation of an "elephant sitting on the chest".  She is having some difficulty breathing, but believes this is related to the bronchitis.  Pt's daughter states that she is not eating and is drinking very little.  She believes that she is dehydrated.  Consulted with William Hamburger, NP-C who advised pt that should be seen in the ER to r/o cardiac problems, PE and to possibly receive IV fluids.  Pt's daughter informed.  Daughter expressed understanding and is agreeable.  Tiajuana Amass, CMA

## 2017-11-09 NOTE — Progress Notes (Signed)
Patient arrived to 6n24, alert and oriented, arrived with daughter at beside. VSS, IV intact and saline locked, on 3L02. Patient reports no pain, said her SOB is getting better, and has some exp. Wheezing with rattling cough. Oriented to room and staff, will continue to monitor.

## 2017-11-09 NOTE — Telephone Encounter (Signed)
Patient's daughter called and said patient is feeling way wore than yesterday and is having some "chest pressure" from the congestion. She wants to speak with someone clinical about what to do.

## 2017-11-09 NOTE — H&P (Signed)
History and Physical    [name] IOM:[id number] DOB: 08-06-1929 DOA: 11/09/2017   PCP: Julaine Fusi, NP   Attending physician: Sherryll Burger  Patient coming from/Resides with: Private residence  Chief Complaint: Shortness of breath and cough  HPI: Christina Ortiz is a 83 y.o. female with medical history significant for CAD with prior stents to the RCA and LAD, PAF on Xarelto, hypothyroidism, hypertension, mild chronic kidney disease, former tobacco abuse with likely undiagnosed COPD.  Patient reports that 2 weeks ago she went to visit her brother in [place].  While there she resumed smoking (previously had quit 3 years ago) and began drinking alcoholic beverages at a much higher rate than she typically does.  She had reported to her PCP at least 5 vodka-containing drinks a day for the week she was up there.  While she was in [place] she began feeling weak and nauseated and had no appetite and developed some mild upper respiratory symptoms.  Symptoms worsened and she developed shortness of breath over the past 48 hours.  She was seen by her PCP yesterday on 11/13 and was reporting productive yellow sputum and was started on Zithromax.  Because of ongoing dyspnea she presented to the ER today.  ED Course:  Vital Signs: BP 112/70   Pulse (!) 37   Temp 97.6 F (36.4 C) (Oral)   Resp (!) 28   Ht 5\' 3"  (1.6 m)   Wt 69.9 kg (154 lb)   SpO2 94%   BMI 27.28 kg/m  2 view CXR: Chronic bronchitic changes without acute airspace disease Lab data: Sodium 140, potassium 3.3, chloride 100, CO2 30, glucose 147, BUN 11, creatinine 0.97, BNP 392, point-of-care troponin 0 0.01, white count 6500 differential not obtained, hemoglobin 12.7, MCV 102.9, platelets 157,000 Medications and treatments: Normal saline bolus times 1 L, DuoNeb x1, Zofran 4 mg IV x1, Rocephin 1 g IV x1, Decadron 8 mg IV x1  Review of Systems:  In addition to the HPI above,  No Fever-chills No Headache, changes with  Vision or hearing, new weakness, tingling, numbness in any extremity, dizziness, dysarthria or word finding difficulty, gait disturbance or imbalance, tremors or seizure activity No problems swallowing food or Liquids, indigestion/reflux, choking or coughing while eating, abdominal pain with or after eating No Chest pain, palpitations, orthopnea  No Abdominal pain, N/V, melena,hematochezia, dark tarry stools, constipation No dysuria, malodorous urine, hematuria or flank pain No new skin rashes, lesions, masses or bruises, No new joint pains, aches, swelling or redness No recent unintentional weight gain or loss No polyuria, polydypsia or polyphagia   Past Medical History:  Diagnosis Date  . Arthritis   . CKD (chronic kidney disease)    Xarelto dose is 15 mg QD  . Coronary artery disease    a. s/p PCI/stenting to RCA and LAD ([facility] in [place]) in 2006; b. Nuc 1/15: No ischemia, EF 70%  . Depression   . Gastritis   . History of cholecystectomy   . History of echocardiogram    a. Echo 1/15: Moderate LVH, EF 55-60%, impaired relaxation, mild AS  . Hyperlipidemia   . Hypertension   . Hypokalemia   . Mobitz type 1 second degree atrioventricular block    a. Event Monitor 3/15: NSR, first-degree AV block, second-degree AB block - Mobitz 1, PACs, NSVT (5 beats)  . PAF (paroxysmal atrial fibrillation) (HCC)   . Thyroid disease     Past Surgical History:  Procedure Laterality Date  . BREAST  LUMPECTOMY Left   . CARDIAC CATHETERIZATION    . CATARACT EXTRACTION Bilateral   . CHOLECYSTECTOMY    . DILATION AND CURETTAGE OF UTERUS    . EYE SURGERY      Social History   Socioeconomic History  . Marital status: Widowed    Spouse name: Not on file  . Number of children: Not on file  . Years of education: Not on file  . Highest education level: Not on file  Social Needs  . Financial resource strain: Not on file  . Food insecurity - worry: Not on file  . Food insecurity -  inability: Not on file  . Transportation needs - medical: Not on file  . Transportation needs - non-medical: Not on file  Occupational History  . Not on file  Tobacco Use  . Smoking status: Former Smoker    Packs/day: 0.50    Years: 67.00    Pack years: 33.50    Types: Cigarettes    Last attempt to quit: 10/25/2015    Years since quitting: 2.0  . Smokeless tobacco: Never Used  . Tobacco comment: quit 2015  Substance and Sexual Activity  . Alcohol use: Yes    Alcohol/week: 6.0 oz    Types: 10 Shots of liquor per week  . Drug use: No  . Sexual activity: No  Other Topics Concern  . Not on file  Social History Narrative  . Not on file    Mobility: Independent Work history: Not obtained   Allergies  Allergen Reactions  . Tape Other (See Comments)    Patient's skin is thin and will TEAR AND BRUISE EASILY    Family History  Problem Relation Age of Onset  . Heart disease Mother   . Heart attack Mother   . Lung cancer Father   . Cancer Father        lung     Prior to Admission medications   Medication Sig Start Date End Date Taking? Authorizing Provider  amLODipine (NORVASC) 10 MG tablet Take 1 tablet (10 mg total) by mouth daily. 09/06/17  Yes Danford, Orpha Bur D, NP  azithromycin (ZITHROMAX) 250 MG tablet 2 tabs by mouth day one,  1 tabs days 2- 5 11/08/17  Yes Danford, Orpha Bur D, NP  benzonatate (TESSALON) 100 MG capsule Take 1 capsule (100 mg total) 2 (two) times daily as needed by mouth for cough. 11/08/17  Yes Danford, Orpha Bur D, NP  carboxymethylcellul-glycerin ([facility] LUBRICATING/DRY EYE) 0.5-0.9 % ophthalmic solution Place 2 drops as needed into both eyes for dry eyes.   Yes [provider]  doxazosin (CARDURA) 8 MG tablet Take 1 tablet (8 mg total) by mouth daily. 09/06/17  Yes Danford, Orpha Bur D, NP  fluticasone (FLONASE) 50 MCG/ACT nasal spray Place 2 sprays daily into both nostrils. 11/08/17  Yes Danford, Orpha Bur D, NP  levothyroxine (SYNTHROID, LEVOTHROID) 75 MCG  tablet Take 1 tablet (75 mcg total) by mouth daily before breakfast. 09/06/17  Yes Danford, Orpha Bur D, NP  metoprolol succinate (TOPROL-XL) 25 MG 24 hr tablet Take 1 tablet (25 mg total) by mouth daily. 09/07/17  Yes Jake Bathe, MD  omeprazole (PRILOSEC) 20 MG capsule Take 1 capsule (20 mg total) by mouth daily as needed (heartburn). 09/06/17  Yes Danford, Orpha Bur D, NP  PARoxetine (PAXIL) 40 MG tablet Take 1 tablet (40 mg total) by mouth daily. 09/06/17  Yes Danford, Orpha Bur D, NP  potassium chloride (K-DUR) 10 MEQ tablet Take 1 tablet (10 mEq total) by mouth  daily. 09/06/17  Yes Danford, Orpha Bur D, NP  quinapril (ACCUPRIL) 40 MG tablet Take 1 tablet (40 mg total) by mouth daily. 09/06/17  Yes Danford, Orpha Bur D, NP  simvastatin (ZOCOR) 5 MG tablet Take 1 tablet (5 mg total) by mouth every evening. 09/06/17  Yes Danford, Jinny Blossom, NP  XARELTO 15 MG TABS tablet TAKE 1 TABLET (15 MG TOTAL) BY MOUTH DAILY WITH SUPPER. 11/08/17  Yes Jake Bathe, MD  XARELTO 15 MG TABS tablet TAKE 1 TABLET BY MOUTH EVERY EVENING Patient not taking: Reported on 11/09/2017 09/06/17   Jake Bathe, MD    Physical Exam: Vitals:   11/09/17 1445 11/09/17 1515 11/09/17 1545 11/09/17 1551  BP: 121/83 120/87 112/70   Pulse: 73 92 77 (!) 37  Resp: (!) 29 (!) 25 (!) 29 (!) 28  Temp:      TempSrc:      SpO2: 94% 96% (!) 84% 94%  Weight:      Height:          Constitutional: NAD, calm, comfortable Eyes: PERRL, lids and conjunctivae normal ENMT: Mucous membranes are moist. Posterior pharynx clear of any exudate or lesions.age-appropriate dentition.  Neck: normal, supple, no masses, no thyromegaly Respiratory: Diffuse bilateral inspiratory and expiratory wheezing with decreased air movement in the bases, tachypnea, increased work of breathing noted with position changes in the bed, nasal cannula oxygen at 3 L Cardiovascular: Irregular, slightly tachycardic rate and rhythm, no murmurs / rubs / gallops. No extremity edema. 2+ pedal  pulses. No carotid bruits.  Abdomen: no tenderness, no masses palpated. No hepatosplenomegaly. Bowel sounds positive.  Musculoskeletal: no clubbing / cyanosis. No joint deformity upper and lower extremities. Good ROM, no contractures. Normal muscle tone.  Skin: no rashes, lesions, ulcers. No induration Neurologic: CN 2-12 grossly intact. Sensation intact, DTR normal. Strength 5/5 x all 4 extremities.  Psychiatric: Normal judgment and insight. Alert and oriented x 3. Normal mood.    Labs on Admission: I have personally reviewed following labs and imaging studies  CBC: Recent Labs  Lab 11/09/17 1113  WBC 6.5  HGB 12.7  HCT 39.3  MCV 102.9*  PLT 157   Basic Metabolic Panel: Recent Labs  Lab 11/09/17 1113  NA 140  K 3.3*  CL 100*  CO2 30  GLUCOSE 147*  BUN 11  CREATININE 0.97  CALCIUM 9.1   GFR: Estimated Creatinine Clearance: 39 mL/min (by C-G formula based on SCr of 0.97 mg/dL). Liver Function Tests: No results for input(s): AST, ALT, ALKPHOS, BILITOT, PROT, ALBUMIN in the last 168 hours. No results for input(s): LIPASE, AMYLASE in the last 168 hours. No results for input(s): AMMONIA in the last 168 hours. Coagulation Profile: No results for input(s): INR, PROTIME in the last 168 hours. Cardiac Enzymes: No results for input(s): CKTOTAL, CKMB, CKMBINDEX, TROPONINI in the last 168 hours. BNP (last 3 results) No results for input(s): PROBNP in the last 8760 hours. HbA1C: No results for input(s): HGBA1C in the last 72 hours. CBG: No results for input(s): GLUCAP in the last 168 hours. Lipid Profile: No results for input(s): CHOL, HDL, LDLCALC, TRIG, CHOLHDL, LDLDIRECT in the last 72 hours. Thyroid Function Tests: No results for input(s): TSH, T4TOTAL, FREET4, T3FREE, THYROIDAB in the last 72 hours. Anemia Panel: No results for input(s): VITAMINB12, FOLATE, FERRITIN, TIBC, IRON, RETICCTPCT in the last 72 hours. Urine analysis:    Component Value Date/Time    COLORURINE YELLOW 05/23/2017 0125   APPEARANCEUR HAZY (A) 05/23/2017 0125  LABSPEC 1.006 05/23/2017 0125   PHURINE 7.0 05/23/2017 0125   GLUCOSEU NEGATIVE 05/23/2017 0125   HGBUR SMALL (A) 05/23/2017 0125   BILIRUBINUR NEGATIVE 05/23/2017 0125   KETONESUR NEGATIVE 05/23/2017 0125   PROTEINUR 30 (A) 05/23/2017 0125   UROBILINOGEN 1.0 04/12/2017 1157   NITRITE NEGATIVE 05/23/2017 0125   LEUKOCYTESUR LARGE (A) 05/23/2017 0125   Sepsis Labs: @LABRCNTIP (procalcitonin:4,lacticidven:4) )No results found for this or any previous visit (from the past 240 hour(s)).   Radiological Exams on Admission: Dg Chest 2 View  Result Date: 11/09/2017 CLINICAL DATA:  Cough and wheezing for 1 week.  History smoking. EXAM: CHEST  2 VIEW COMPARISON:  11/08/2017. FINDINGS: The cardiac silhouette remains prominently enlarged. There is also evidence of a sizable hiatal hernia. Aortic atherosclerosis is noted. There is mild chronic peribronchial thickening. No confluent airspace opacity, overt edema, or pneumothorax is identified. No acute osseous abnormality is identified. IMPRESSION: 1. Chronic bronchitic changes without evidence of acute airspace disease. 2. Cardiomegaly and hiatal hernia. Electronically Signed   By: Sebastian AcheAllen  Grady M.D.   On: 11/09/2017 15:24   Dg Chest 2 View  Result Date: 11/09/2017 CLINICAL DATA:  Upper respiratory tract infection, dyspnea, history of coronary artery disease and chronic renal insufficiency, former smoker. EXAM: CHEST  2 VIEW COMPARISON:  PA and lateral chest x-ray of January 22, 2017 FINDINGS: The lungs are well-expanded with hemidiaphragm flattening. The interstitial markings are increased bilaterally. The cardiac silhouette is enlarged and stable. The left lateral costophrenic angle is blunted as is the right but this is stable. The pulmonary vascularity is not clearly engorged. There is calcification in the wall of the aortic arch. The observed bony thorax exhibits no acute  abnormality. IMPRESSION: Chronic bronchitic changes. Increased interstitial markings bilaterally may reflect a pneumonitis type process or less likely pulmonary edema given the lack of significant pulmonary vascular congestion. Stable cardiomegaly. Thoracic aortic atherosclerosis. Electronically Signed   By: David  SwazilandJordan M.D.   On: 11/09/2017 07:46    EKG: (Independently reviewed) atrial fibrillation with ventricular rate 71 bpm, QTC 449 ms, right axis deviation, no acute ischemic changes  Assessment/Plan Principal Problem:    Acute respiratory failure with hypoxia 2/2 Acute bronchitis -Presents with dyspnea, cough with productive yellow to white sputum and new hypoxemia (EDP documents O2 sats dropping into the low 80s on room air at rest and with ambulation) -Suspect bronchitis, possible viral etiology therefore check respiratory viral panel and influenza PCR; he did receive influenza vaccine this year -Suspect patient may also have underlying undiagnosed COPD given history of greater than 30 pack years tobacco usage and recent resumption of smoking after 3 years-certainly combination of binge drinking and smoking while out of town as well as air travel/multiple sick contacts in airport increases risk for URI symptoms -Empiric Rocephin and Zithromax IV -Continue oxygen for supportive care-potentially could discharge home on short-term oxygen if necessary -Obtain blood cultures and sputum culture -HIV antibody per protocol -Lower respiratory tract Procalcitonin -Urinary strep -Xopenex nebulizers (no DuoNeb secondary to history of PAF) -Low-dose Solu-Medrol 60 mg IV every 12 hours -Normal saline IV at 100/hr  Active Problems:   Hypertension -Current blood pressure well controlled -Continue preadmission Norvasc, Cardura, and Toprol as blood pressure tolerates -Need to clarify if was on quinapril prior to admission    PAF (paroxysmal atrial fibrillation)  -Currently rate  controlled -Continue beta-blocker -Continue Xarelto -CHADS2 Score for Stroke Risk in Atrial Fibrillation History of CHF: No (0 points) History of hypertension: Yes (1 point)  Age greater than or equal to 75 years: Yes (1 point) History of diabetes mellitus: No (0 points) Previous stroke symptoms or TIA: No (0 points)  Score: 2. Thromboembolic event intermediate risk. Risk of event is 4.0% per year if no coumadin.    Coronary artery disease -Symptomatic -Continue beta-blocker -Need to clarify if was on simvastatin prior to admission    Alcohol abuse -Patient admitted to binge drinking over the past 2 weeks while visiting brother in [place] -In review of cardiologist outpatient note from 10/26/17 patient admitted to binge drinking with her brother and [place] that time -In that note it was also documented that the patient had recently been widowed noting her husband had died 2 months prior -Patient may benefit from either psychiatric evaluation or better palliative care for grief counseling/management and outpatient resources -Patient may be minimizing her regular alcohol intake noting she was somewhat evasive when I asked her if she had been drinking since returning home from her brothers -Routine CIWA with prn parameters    CKD (chronic kidney disease) stage 2, GFR 60-89 ml/min -Renal function stable and at baseline    Hypothyroidism -Continue Synthroid    Macrocytosis -Obtain B12 and RBC folate levels      DVT prophylaxis: Xarelto Code Status: Full Family Communication: Daughter Disposition Plan: Home Consults called: None    ELLIS,ALLISON L. ANP-BC [facility] Pager [phone]   If 7PM-7AM, please contact night-coverage [email or web address] Password [facility]  11/09/2017, 4:28 PM

## 2017-11-10 ENCOUNTER — Encounter (HOSPITAL_COMMUNITY): Payer: Self-pay | Admitting: General Practice

## 2017-11-10 ENCOUNTER — Other Ambulatory Visit: Payer: Self-pay

## 2017-11-10 DIAGNOSIS — N182 Chronic kidney disease, stage 2 (mild): Secondary | ICD-10-CM

## 2017-11-10 DIAGNOSIS — I480 Paroxysmal atrial fibrillation: Secondary | ICD-10-CM

## 2017-11-10 DIAGNOSIS — J9601 Acute respiratory failure with hypoxia: Secondary | ICD-10-CM

## 2017-11-10 DIAGNOSIS — E039 Hypothyroidism, unspecified: Secondary | ICD-10-CM

## 2017-11-10 DIAGNOSIS — I10 Essential (primary) hypertension: Secondary | ICD-10-CM

## 2017-11-10 LAB — CBC
HCT: 35.7 % — ABNORMAL LOW (ref 36.0–46.0)
Hemoglobin: 11.3 g/dL — ABNORMAL LOW (ref 12.0–15.0)
MCH: 32.7 pg (ref 26.0–34.0)
MCHC: 31.7 g/dL (ref 30.0–36.0)
MCV: 103.2 fL — ABNORMAL HIGH (ref 78.0–100.0)
PLATELETS: 131 10*3/uL — AB (ref 150–400)
RBC: 3.46 MIL/uL — ABNORMAL LOW (ref 3.87–5.11)
RDW: 14.3 % (ref 11.5–15.5)
WBC: 5.9 10*3/uL (ref 4.0–10.5)

## 2017-11-10 LAB — RESPIRATORY PANEL BY PCR
ADENOVIRUS-RVPPCR: NOT DETECTED
Bordetella pertussis: NOT DETECTED
CHLAMYDOPHILA PNEUMONIAE-RVPPCR: NOT DETECTED
Coronavirus 229E: NOT DETECTED
Coronavirus HKU1: NOT DETECTED
Coronavirus NL63: NOT DETECTED
Coronavirus OC43: NOT DETECTED
INFLUENZA A-RVPPCR: NOT DETECTED
INFLUENZA B-RVPPCR: NOT DETECTED
MYCOPLASMA PNEUMONIAE-RVPPCR: NOT DETECTED
Metapneumovirus: NOT DETECTED
PARAINFLUENZA VIRUS 1-RVPPCR: NOT DETECTED
PARAINFLUENZA VIRUS 4-RVPPCR: NOT DETECTED
Parainfluenza Virus 2: NOT DETECTED
Parainfluenza Virus 3: NOT DETECTED
RESPIRATORY SYNCYTIAL VIRUS-RVPPCR: DETECTED — AB
Rhinovirus / Enterovirus: NOT DETECTED

## 2017-11-10 LAB — COMPREHENSIVE METABOLIC PANEL
ALBUMIN: 3.1 g/dL — AB (ref 3.5–5.0)
ALK PHOS: 63 U/L (ref 38–126)
ALT: 9 U/L — AB (ref 14–54)
ANION GAP: 8 (ref 5–15)
AST: 22 U/L (ref 15–41)
BILIRUBIN TOTAL: 0.7 mg/dL (ref 0.3–1.2)
BUN: 19 mg/dL (ref 6–20)
CALCIUM: 8.3 mg/dL — AB (ref 8.9–10.3)
CO2: 27 mmol/L (ref 22–32)
CREATININE: 0.96 mg/dL (ref 0.44–1.00)
Chloride: 106 mmol/L (ref 101–111)
GFR calc Af Amer: >60 mL/min (ref >60)
GFR calc non Af Amer: 52 mL/min — ABNORMAL LOW (ref >60)
GLUCOSE: 147 mg/dL — AB (ref 65–99)
Potassium: 4.3 mmol/L (ref 3.5–5.1)
Sodium: 141 mmol/L (ref 135–145)
TOTAL PROTEIN: 6.4 g/dL — AB (ref 6.5–8.1)

## 2017-11-10 LAB — FOLATE RBC
FOLATE, HEMOLYSATE: 416.5 ng/mL
Folate, RBC: 1183 ng/mL (ref >498)
Hematocrit: 35.2 % (ref 34.0–46.6)

## 2017-11-10 LAB — STREP PNEUMONIAE URINARY ANTIGEN: STREP PNEUMO URINARY ANTIGEN: NEGATIVE

## 2017-11-10 LAB — HIV ANTIBODY (ROUTINE TESTING W REFLEX): HIV SCREEN 4TH GENERATION: NONREACTIVE

## 2017-11-10 MED ORDER — LEVOTHYROXINE SODIUM 75 MCG PO TABS: 75.0000 ug | ORAL_TABLET | Freq: Every day | ORAL | 0 refills | Status: DC

## 2017-11-10 MED ORDER — LEVALBUTEROL HCL 0.63 MG/3ML IN NEBU
0.6300 mg | INHALATION_SOLUTION | Freq: Four times a day (QID) | RESPIRATORY_TRACT | Status: DC
  Administered 2017-11-10 – 2017-11-11 (×4): 0.63 mg via RESPIRATORY_TRACT
  Filled 2017-11-10 (×4): qty 3

## 2017-11-10 MED ORDER — AZITHROMYCIN 250 MG PO TABS
250.0000 mg | ORAL_TABLET | Freq: Every day | ORAL | Status: DC
  Administered 2017-11-10: 250 mg via ORAL
  Filled 2017-11-10: qty 1

## 2017-11-10 MED ORDER — METHYLPREDNISOLONE SODIUM SUCC 125 MG IJ SOLR
60.0000 mg | Freq: Every day | INTRAMUSCULAR | Status: DC
  Administered 2017-11-11: 60 mg via INTRAVENOUS
  Filled 2017-11-10: qty 2

## 2017-11-10 MED ORDER — LEVALBUTEROL HCL 0.63 MG/3ML IN NEBU: 0.6300 mg | INHALATION_SOLUTION | RESPIRATORY_TRACT | Status: DC | PRN

## 2017-11-10 NOTE — Progress Notes (Signed)
CRITICAL VALUE ALERT  Critical Value:  Positive RSV on respiratory panel  Date & Time Notied:  0250  Provider Notified: Dr. Toniann Fail  Orders Received/Actions taken: no new orders

## 2017-11-10 NOTE — Care Management Note (Signed)
Case Management Note  Patient Details  Name: Christina Ortiz MRN: [id number] Date of Birth: 1929/12/23  Subjective/Objective:                    Action/Plan:  Discussed discharge planning with patient at bedside. Confirmed address with patient. Patient lives alone but her daughter [name] home [phone] cell [phone] lives 5 minutes away. Provided list of home health agencies ,patient will discuss with her daughter .   Confirmed patient does not have home oxygen at present. Explained if needed portable tank will be delivered to hospital room before discharge . Home oxygen  DME will be delivered to home   Patient voiced understanding to all of above . Will continue to follow.  Expected Discharge Date:                  Expected Discharge Plan:  Home w Home Health Services  In-House Referral:     Discharge planning Services  CM Consult  Post Acute Care Choice:  Home Health, Durable Medical Equipment Choice offered to:  Patient  DME Arranged:    DME Agency:     HH Arranged:    HH Agency:     Status of Service:     If discussed at [employer] of Stay Meetings, dates discussed:    Additional Comments:  Kingsley Plan, RN 11/10/2017, 3:00 PM

## 2017-11-10 NOTE — Progress Notes (Addendum)
PROGRESS NOTE    Christina Ortiz  QIH:[id number] DOB: [birth year] DOA: 11/09/2017 PCP: William Hamburger D, NP   Brief Narrative: Christina Ortiz is a 83 y.o. female with a history of CAD status post stents, paroxysmal atrial fibrillation on Xarelto, hypothyroidism, essential hypertension, CKD, tobacco abuse.  Patient presented with dyspnea and cough concerning for viral respiratory infection.  She required oxygen via nasal cannula for hypoxia.  Respiratory viral panel was positive for respiratory syncytial virus  Assessment & Plan:   Principal Problem:   Acute bronchitis Active Problems:   Acute respiratory failure with hypoxia (HCC)   Hypertension   PAF (paroxysmal atrial fibrillation) (HCC)   Coronary artery disease   CKD (chronic kidney disease) stage 2, GFR 60-89 ml/min   Hypothyroidism   Lower respiratory infection secondary to RSV Acute respiratory failure with hypoxia Viral panel significant for RSV infection.  Patient with upper respiratory wheezing.  It is possible she has an underlying lung disease with history of smoking. -Continue nebulizer treatments -Continue methylprednisolone IV -Discontinue ceftriaxone and azithromycin -Supportive care with oxygen as needed to keep O2 sats greater than 92%  Hypothyroidism -Continue Synthroid  CKD stage II Stable.  Essential hypertension Stable. -Continue metoprolol and amlodipine  Paroxysmal atrial fibrillation Rate controlled. -Continue metoprolol -Continue Xarelto  Depression -Continue Paxil  Alcoholic binge drinking Secondary to recent loss of husband per chart review. -Continue CIWA  Macrocytosis Mild.  Likely secondary to alcohol use.  Vitamin B12 within normal limits -Folate pending    DVT prophylaxis: Xarelto Code Status: Full code Family Communication: None at bedside Disposition Plan: Discharge in 24-48 hours pending weaning off oxygen and PT/OT recommendations for disposition   Consultants:     None  Procedures:   None  Antimicrobials:  Ceftriaxone (11/14>>11/15)  Azithromycin (11/14>>11/15)   Subjective: Patient reports no chest pain or dyspnea.  Objective: Vitals:   11/10/17 0519 11/10/17 0545 11/10/17 0742 11/10/17 0744  BP:  120/74    Pulse:  86    Resp:  20    Temp:  98.7 F (37.1 C)    TempSrc:  Oral    SpO2:  96% 95% 95%  Weight: 73.2 kg (161 lb 6 oz)     Height:        Intake/Output Summary (Last 24 hours) at 11/10/2017 1152 Last data filed at 11/10/2017 0700 Gross per 24 hour  Intake 2251.67 ml  Output 450 ml  Net 1801.67 ml   Filed Weights   11/09/17 1105 11/09/17 1819 11/10/17 0519  Weight: 69.9 kg (154 lb) 73.1 kg (161 lb 2.5 oz) 73.2 kg (161 lb 6 oz)    Examination:  General exam: Appears calm and comfortable Respiratory system: Clear to auscultation. Respiratory effort normal.  Upper airway wheezing without stridor. Cardiovascular system: S1 & S2 heard, RRR. No murmurs. Gastrointestinal system: Abdomen is nondistended, soft and nontender. Normal bowel sounds heard. Central nervous system: Alert and oriented. No focal neurological deficits. Extremities: No edema. No calf tenderness Skin: No cyanosis. No rashes Psychiatry: Judgement and insight appear normal. Mood & affect appropriate.     Data Reviewed: I have personally reviewed following labs and imaging studies  CBC: Recent Labs  Lab 11/09/17 1113 11/10/17 0539  WBC 6.5 5.9  HGB 12.7 11.3*  HCT 39.3 35.7*  MCV 102.9* 103.2*  PLT 157 131*   Basic Metabolic Panel: Recent Labs  Lab 11/09/17 1113 11/10/17 0539  NA 140 141  K 3.3* 4.3  CL 100* 106  CO2 30 27  GLUCOSE 147* 147*  BUN 11 19  CREATININE 0.97 0.96  CALCIUM 9.1 8.3*   GFR: Estimated Creatinine Clearance: 40.3 mL/min (by C-G formula based on SCr of 0.96 mg/dL). Liver Function Tests: Recent Labs  Lab 11/10/17 0539  AST 22  ALT 9*  ALKPHOS 63  BILITOT 0.7  PROT 6.4*  ALBUMIN 3.1*   No  results for input(s): LIPASE, AMYLASE in the last 168 hours. No results for input(s): AMMONIA in the last 168 hours. Coagulation Profile: No results for input(s): INR, PROTIME in the last 168 hours. Cardiac Enzymes: No results for input(s): CKTOTAL, CKMB, CKMBINDEX, TROPONINI in the last 168 hours. BNP (last 3 results) No results for input(s): PROBNP in the last 8760 hours. HbA1C: No results for input(s): HGBA1C in the last 72 hours. CBG: No results for input(s): GLUCAP in the last 168 hours. Lipid Profile: No results for input(s): CHOL, HDL, LDLCALC, TRIG, CHOLHDL, LDLDIRECT in the last 72 hours. Thyroid Function Tests: No results for input(s): TSH, T4TOTAL, FREET4, T3FREE, THYROIDAB in the last 72 hours. Anemia Panel: Recent Labs    11/09/17 1636  VITAMINB12 532   Sepsis Labs: Recent Labs  Lab 11/09/17 1636  PROCALCITON <0.10    Recent Results (from the past 240 hour(s))  Respiratory Panel by PCR     Status: Abnormal   Collection Time: 11/09/17  4:55 PM  Result Value Ref Range Status   Adenovirus NOT DETECTED NOT DETECTED Final   Coronavirus 229E NOT DETECTED NOT DETECTED Final   Coronavirus HKU1 NOT DETECTED NOT DETECTED Final   Coronavirus NL63 NOT DETECTED NOT DETECTED Final   Coronavirus OC43 NOT DETECTED NOT DETECTED Final   Metapneumovirus NOT DETECTED NOT DETECTED Final   Rhinovirus / Enterovirus NOT DETECTED NOT DETECTED Final   Influenza A NOT DETECTED NOT DETECTED Final   Influenza B NOT DETECTED NOT DETECTED Final   Parainfluenza Virus 1 NOT DETECTED NOT DETECTED Final   Parainfluenza Virus 2 NOT DETECTED NOT DETECTED Final   Parainfluenza Virus 3 NOT DETECTED NOT DETECTED Final   Parainfluenza Virus 4 NOT DETECTED NOT DETECTED Final   Respiratory Syncytial Virus DETECTED (A) NOT DETECTED Final    Comment: CRITICAL RESULT CALLED TO, READ BACK BY AND VERIFIED WITH: RN J MCBRIDE [id number] MLM    Bordetella pertussis NOT DETECTED NOT DETECTED Final    Chlamydophila pneumoniae NOT DETECTED NOT DETECTED Final   Mycoplasma pneumoniae NOT DETECTED NOT DETECTED Final         Radiology Studies: Dg Chest 2 View  Result Date: 11/09/2017 CLINICAL DATA:  Cough and wheezing for 1 week.  History smoking. EXAM: CHEST  2 VIEW COMPARISON:  11/08/2017. FINDINGS: The cardiac silhouette remains prominently enlarged. There is also evidence of a sizable hiatal hernia. Aortic atherosclerosis is noted. There is mild chronic peribronchial thickening. No confluent airspace opacity, overt edema, or pneumothorax is identified. No acute osseous abnormality is identified. IMPRESSION: 1. Chronic bronchitic changes without evidence of acute airspace disease. 2. Cardiomegaly and hiatal hernia. Electronically Signed   By: Sebastian Ache M.D.   On: 11/09/2017 15:24   Dg Chest 2 View  Result Date: 11/09/2017 CLINICAL DATA:  Upper respiratory tract infection, dyspnea, history of coronary artery disease and chronic renal insufficiency, former smoker. EXAM: CHEST  2 VIEW COMPARISON:  PA and lateral chest x-ray of January 22, 2017 FINDINGS: The lungs are well-expanded with hemidiaphragm flattening. The interstitial markings are increased bilaterally. The cardiac silhouette is enlarged and stable. The left lateral costophrenic  angle is blunted as is the right but this is stable. The pulmonary vascularity is not clearly engorged. There is calcification in the wall of the aortic arch. The observed bony thorax exhibits no acute abnormality. IMPRESSION: Chronic bronchitic changes. Increased interstitial markings bilaterally may reflect a pneumonitis type process or less likely pulmonary edema given the lack of significant pulmonary vascular congestion. Stable cardiomegaly. Thoracic aortic atherosclerosis. Electronically Signed   By: David  Swazil[place]Jordan M.D.   On: 11/09/2017 07:46        Scheduled Meds: . amLODipine  10 mg Oral Daily  . azithromycin  250 mg Oral Daily  . budesonide  (PULMICORT) nebulizer solution  0.25 mg Nebulization BID  . doxazosin  8 mg Oral Daily  . fluticasone  2 spray Each Nare Daily  . folic acid  1 mg Oral Daily  . levalbuterol  0.63 mg Nebulization Q6H  . levothyroxine  75 mcg Oral QAC breakfast  . [START ON 11/11/2017] methylPREDNISolone (SOLU-MEDROL) injection  60 mg Intravenous Daily  . metoprolol succinate  25 mg Oral Daily  . multivitamin with minerals  1 tablet Oral Daily  . pantoprazole  40 mg Oral Daily  . PARoxetine  40 mg Oral Daily  . potassium chloride  10 mEq Oral Daily  . Rivaroxaban  15 mg Oral QPM  . sodium chloride flush  3 mL Intravenous Q12H  . thiamine  100 mg Oral Daily   Or  . thiamine  100 mg Intravenous Daily   Continuous Infusions:   LOS: 1 day     Jacquelin Hawkingalph Jax Kentner, MD [facility] 11/10/2017, 11:52 AM Pager: [phone]  If 7PM-7AM, please contact night-coverage [email or web address] Password [email or web address] 11/10/2017, 11:52 AM

## 2017-11-10 NOTE — Progress Notes (Signed)
Physical Therapy Treatment Patient Details Name: Christina Ortiz MRN: [id number] DOB: 12/11/1929 Today's Date: 11/10/2017    History of Present Illness Pt is an 83 y/o female admitted for increased cough, congestion and wheezing. Results revealed RSV. PMH includes CAD s/p stent placement, HTN, CKD, a fib, current smoker, and alcohol abuse.     PT Comments    Pt admitted secondary to problem above with deficits below. PTA, pt was independent with functional mobility and living alone. Upon eval, pt presenting with weakness, slightly decreased balance, wheezing, and decreased cardiopulmonary endurance. Oxygen sats ranged from 87-93% on 3L during session and pt with wheezing, however, reported no increase in difficulty breathing. Oxygen sats decreased to 87% on 3L following ambulation, and required seated rest and pursed lip breathing to return to 93% on 3L. Required min guard assist for mobility this session. Reports daughter can check on pt intermittently, and pt wanting to go home at d/c. Recommending HHPT at d/c to increase independence and safety with functional mobility. Will continue to follow acutely to progress mobility and increase independence and safety with mobility.     Follow Up Recommendations  Home health PT;Supervision for mobility/OOB     Equipment Recommendations  None recommended by PT    Recommendations for Other Services       Precautions / Restrictions Precautions Precautions: Fall;Other (comment) Precaution Comments: Watch oxygen sats; on contact and droplet precautions.  Restrictions Weight Bearing Restrictions: No    Mobility  Bed Mobility Overal bed mobility: Needs Assistance Bed Mobility: Supine to Sit     Supine to sit: Supervision     General bed mobility comments: Supervision for safety.   Transfers Overall transfer level: Needs assistance Equipment used: None Transfers: Sit to/from Stand Sit to Stand: Min guard         General transfer  comment: Min guard for safety. Pt sats decreased to 89% on 3L upon standing, however, returned to 90% with pursed lip breathing.   Ambulation/Gait Ambulation/Gait assistance: Min guard Ambulation Distance (Feet): 40 Feet Assistive device: None Gait Pattern/deviations: Step-through pattern;Decreased stride length Gait velocity: Decreased Gait velocity interpretation: Below normal speed for age/gender General Gait Details: Slow, slightly unsteady gait, however, no overt LOB. Oxygen sats at 90% on 3L during ambulation. Noted wheezing with ambulation, however, pt reports no increase in difficulty breathing. Upon sitting following gait, sats decreased to 87% on 3L, however, returned to 93% on 3L with seated rest and pursed lip breathing.    Stairs            Wheelchair Mobility    Modified Rankin (Stroke Patients Only)       Balance Overall balance assessment: Needs assistance Sitting-balance support: No upper extremity supported;Feet supported Sitting balance-Leahy Scale: Good     Standing balance support: No upper extremity supported;During functional activity Standing balance-Leahy Scale: Fair                              Cognition Arousal/Alertness: Awake/alert Behavior During Therapy: WFL for tasks assessed/performed Overall Cognitive Status: Within Functional Limits for tasks assessed                                        Exercises      General Comments General comments (skin integrity, edema, etc.): Pt's daughter present during session. Pt wanting to go home at  d/c, so educated about HHPT recommendations and pt and daughter agreeable.       Pertinent Vitals/Pain Pain Assessment: No/denies pain    Home Living Family/patient expects to be discharged to:: Private residence Living Arrangements: Alone Available Help at Discharge: Family;Available PRN/intermittently Type of Home: Other(Comment)(condo ) Home Access: Level entry    Home Layout: One level Home Equipment: Walker - 2 wheels;Cane - single point;Wheelchair - manual;Shower seat      Prior Function Level of Independence: Independent          PT Goals (current goals can now be found in the care plan section) Acute Rehab PT Goals Patient Stated Goal: to go home  PT Goal Formulation: With patient Time For Goal Achievement: 11/24/17 Potential to Achieve Goals: Good    Frequency    Min 3X/week      PT Plan      Co-evaluation              AM-PAC PT "6 Clicks" Daily Activity  Outcome Measure  Difficulty turning over in bed (including adjusting bedclothes, sheets and blankets)?: A Little Difficulty moving from lying on back to sitting on the side of the bed? : A Little Difficulty sitting down on and standing up from a chair with arms (e.g., wheelchair, bedside commode, etc,.)?: Unable Help needed moving to and from a bed to chair (including a wheelchair)?: A Little Help needed walking in hospital room?: A Little Help needed climbing 3-5 steps with a railing? : A Lot 6 Click Score: 15    End of Session Equipment Utilized During Treatment: Gait belt;Oxygen Activity Tolerance: Patient tolerated treatment well Patient left: in bed;with call bell/phone within reach;with family/visitor present;Other (comment)(MD in room ) Nurse Communication: Mobility status;Other (comment)(oxygen sats ) PT Visit Diagnosis: Unsteadiness on feet (R26.81);Muscle weakness (generalized) (M62.81)     Time: 0355-9741 PT Time Calculation (min) (ACUTE ONLY): 25 min  Charges:  $Gait Training: 8-22 mins                    G Codes:       [name], PT, DPT  Acute Rehabilitation Services  Pager: [phone]    [name] 11/10/2017, 12:37 PM

## 2017-11-11 DIAGNOSIS — J441 Chronic obstructive pulmonary disease with (acute) exacerbation: Secondary | ICD-10-CM

## 2017-11-11 DIAGNOSIS — J205 Acute bronchitis due to respiratory syncytial virus: Principal | ICD-10-CM

## 2017-11-11 DIAGNOSIS — R0902 Hypoxemia: Secondary | ICD-10-CM

## 2017-11-11 DIAGNOSIS — J210 Acute bronchiolitis due to respiratory syncytial virus: Secondary | ICD-10-CM

## 2017-11-11 LAB — BLOOD CULTURE ID PANEL (REFLEXED)

## 2017-11-11 MED ORDER — PREDNISONE 50 MG PO TABS
50.0000 mg | ORAL_TABLET | Freq: Every day | ORAL | Status: AC
  Administered 2017-11-12 – 2017-11-14 (×3): 50 mg via ORAL
  Filled 2017-11-11 (×3): qty 1

## 2017-11-11 NOTE — Care Management Note (Signed)
Case Management Note  Patient Details  Name: Lenaya Whitefield MRN: [id number] Date of Birth: 18-Nov-1929  Subjective/Objective:                    Action/Plan:  Revisited patient . Patient has decided on [facility] for home health PT. Will continue to follow for possible home oxygen.    Referral given to [facility] with [facility] for HHPT, Brad also aware home oxygen may be needed. Expected Discharge Date:                  Expected Discharge Plan:  Home w Home Health Services  In-House Referral:     Discharge planning Services  CM Consult  Post Acute Care Choice:  Home Health, Durable Medical Equipment Choice offered to:  Patient  DME Arranged:  Oxygen DME Agency:     HH Arranged:  PT HH Agency:  [facility]  Status of Service:  In process, will continue to follow  If discussed at Long Length of Stay Meetings, dates discussed:    Additional Comments:  Kingsley Plan, RN 11/11/2017, 10:07 AM

## 2017-11-11 NOTE — Progress Notes (Signed)
Spotcheck O2 at 89 to 90 at this time. Increased O2 to 4 L/. Notify RT for breathing treatment.

## 2017-11-11 NOTE — Progress Notes (Signed)
Physical Therapy Treatment Patient Details Name: Christina Ortiz MRN: [id number] DOB: 1929-03-16 Today's Date: 11/11/2017    History of Present Illness Pt is an 83 y/o female admitted for increased cough, congestion and wheezing. Results revealed RSV. PMH includes CAD s/p stent placement, HTN, CKD, a fib, current smoker, and alcohol abuse.     PT Comments    Pt is progressing well with her mobility, able to walk further down the hall and better able to maintain O2 sats 90-91% on 4 L O2 [place] with DOE 3/4 with gait requiring a seated rest break before returning to her room.  From a mobility standpoint, she is great.  She has very poor activity tolerance as even just sitting up on the side of the bed makes her dyspneic.   Incentive spirometer use reviewed with pt and daughter and use will need to be reinforced.   Follow Up Recommendations  Home health PT;Supervision for mobility/OOB     Equipment Recommendations  None recommended by PT    Recommendations for Other Services   NA     Precautions / Restrictions Precautions Precautions: Fall;Other (comment) Precaution Comments: watch O2 sats, bring tank.  Per RN she is no longer on droplet precautions.     Mobility  Bed Mobility Overal bed mobility: Modified Independent             General bed mobility comments: Extra time needed and pt used the rail.   Transfers Overall transfer level: Needs assistance Equipment used: Rolling walker (2 wheeled) Transfers: Sit to/from Stand Sit to Stand: Supervision         General transfer comment: supervision for safety, verbal cues for safe hand placement.   Ambulation/Gait Ambulation/Gait assistance: Min guard Ambulation Distance (Feet): 200 Feet Assistive device: Rolling walker (2 wheeled) Gait Pattern/deviations: Step-through pattern;Shuffle     General Gait Details: Pt with faster gait speed today with RW.  We used it not for balance, but to help with energy conservation.  Her  daughter followed with the recliner chair to encourage increased gait distance and because pt gets pretty dyspnic (3/4) with gait.  O2 sats remained 90-91% on 4 L O2 [place] during gait despite increased DOE.  HR 80s-90s during gait.           Balance Overall balance assessment: Needs assistance Sitting-balance support: Feet supported;Bilateral upper extremity supported Sitting balance-Leahy Scale: Good     Standing balance support: No upper extremity supported;During functional activity Standing balance-Leahy Scale: Good                              Cognition Arousal/Alertness: Awake/alert Behavior During Therapy: WFL for tasks assessed/performed Overall Cognitive Status: Within Functional Limits for tasks assessed                                 General Comments: Pt is HOH which makes her seem confused at times, but I think this is because she didn't hear what was said.       Exercises Other Exercises Other Exercises: IS x 5 reps, 2 sets, 750 mL max inspired volume.  cues for correct use of IS.     General Comments General comments (skin integrity, edema, etc.): Pt's daughter present and assisting with session.        Pertinent Vitals/Pain Pain Assessment: No/denies pain  PT Goals (current goals can now be found in the care plan section) Acute Rehab PT Goals Patient Stated Goal: to go home  Progress towards PT goals: Progressing toward goals    Frequency    Min 3X/week      PT Plan Current plan remains appropriate       AM-PAC PT "6 Clicks" Daily Activity  Outcome Measure  Difficulty turning over in bed (including adjusting bedclothes, sheets and blankets)?: A Little Difficulty moving from lying on back to sitting on the side of the bed? : A Little Difficulty sitting down on and standing up from a chair with arms (e.g., wheelchair, bedside commode, etc,.)?: Unable Help needed moving to and from a bed to chair (including a  wheelchair)?: A Little Help needed walking in hospital room?: A Little Help needed climbing 3-5 steps with a railing? : A Little 6 Click Score: 16    End of Session Equipment Utilized During Treatment: Oxygen;Gait belt Activity Tolerance: Other (comment)(limited by DOE) Patient left: in chair;with call bell/phone within reach;with family/visitor present   PT Visit Diagnosis: Unsteadiness on feet (R26.81);Muscle weakness (generalized) (M62.81)     Time: [id number] PT Time Calculation (min) (ACUTE ONLY): 32 min  Charges:  $Gait Training: 23-37 mins          Minh Roanhorse B. Lizandro Spellman, PT, DPT [phone]             11/11/2017, 3:53 PM

## 2017-11-11 NOTE — Progress Notes (Signed)
PROGRESS NOTE    Christina Ortiz  ZOX:[id number] DOB: 1929-04-28 DOA: 11/09/2017 PCP: William Hamburger D, NP   Brief Narrative: Christina Ortiz is a 83 y.o. female with a history of CAD status post stents, paroxysmal atrial fibrillation on Xarelto, hypothyroidism, essential hypertension, CKD, tobacco abuse.  Patient presented with dyspnea and cough concerning for viral respiratory infection.  She required oxygen via nasal cannula for hypoxia.  Respiratory viral panel was positive for respiratory syncytial virus  Assessment & Plan:   Principal Problem:   Acute bronchitis Active Problems:   Acute respiratory failure with hypoxia (HCC)   Hypertension   PAF (paroxysmal atrial fibrillation) (HCC)   Coronary artery disease   CKD (chronic kidney disease) stage 2, GFR 60-89 ml/min   Hypothyroidism   Lower respiratory infection secondary to RSV Acute respiratory failure with hypoxia Viral panel significant for RSV infection.  Patient with upper respiratory wheezing.  It is possible she has an underlying lung disease with history of smoking. Oxygen requirement increasing -Continue nebulizer treatments -Continue methylprednisolone IV -Supportive care with oxygen as needed to keep O2 sats greater than 92% -pulmonology consult  Hypothyroidism -Continue Synthroid  CKD stage II Stable.  Essential hypertension Stable. -Continue metoprolol and amlodipine  Paroxysmal atrial fibrillation Rate controlled. -Continue metoprolol -Continue Xarelto  Depression -Continue Paxil  Alcoholic binge drinking Secondary to recent loss of husband per chart review. -Continue CIWA  Macrocytosis Mild.  Likely secondary to alcohol use.  Vitamin B12 and folate within normal limits -outpatient follow-up    DVT prophylaxis: Xarelto Code Status: Full code Family Communication: None at bedside Disposition Plan: Discharge in 24-48 hours pending weaning off oxygen and PT/OT recommendations for  disposition   Consultants:   None  Procedures:   None  Antimicrobials:  Ceftriaxone (11/14>>11/15)  Azithromycin (11/14>>11/15)   Subjective: Dyspnea improved. Coughing without production. Afebrile.  Objective: Vitals:   11/11/17 0408 11/11/17 0500 11/11/17 0840 11/11/17 0841  BP:  (!) 145/91    Pulse:  77    Resp:  16    Temp:  (!) 97.5 F (36.4 C)    TempSrc:      SpO2:  97% 94% 94%  Weight: 73.2 kg (161 lb 6 oz)     Height:        Intake/Output Summary (Last 24 hours) at 11/11/2017 1156 Last data filed at 11/11/2017 0900 Gross per 24 hour  Intake 480 ml  Output 700 ml  Net -220 ml   Filed Weights   11/09/17 1819 11/10/17 0519 11/11/17 0408  Weight: 73.1 kg (161 lb 2.5 oz) 73.2 kg (161 lb 6 oz) 73.2 kg (161 lb 6 oz)    Examination:  General exam: Appears calm and comfortable Respiratory system: Diminished breath sounds.  Upper airway wheezing without stridor with transmitted wheezing. Cardiovascular system: S1 & S2 heard, RRR. No murmurs. Gastrointestinal system: Abdomen is nondistended, soft and nontender. Normal bowel sounds heard. Central nervous system: Alert and oriented. No focal neurological deficits. Extremities: No edema. No calf tenderness Skin: No cyanosis. No rashes Psychiatry: Judgement and insight appear normal. Mood & affect appropriate.     Data Reviewed: I have personally reviewed following labs and imaging studies  CBC: Recent Labs  Lab 11/09/17 1113 11/09/17 1636 11/10/17 0539  WBC 6.5  --  5.9  HGB 12.7  --  11.3*  HCT 39.3 35.2 35.7*  MCV 102.9*  --  103.2*  PLT 157  --  131*   Basic Metabolic Panel: Recent Labs  Lab 11/09/17  1113 11/10/17 0539  NA 140 141  K 3.3* 4.3  CL 100* 106  CO2 30 27  GLUCOSE 147* 147*  BUN 11 19  CREATININE 0.97 0.96  CALCIUM 9.1 8.3*   GFR: Estimated Creatinine Clearance: 40.3 mL/min (by C-G formula based on SCr of 0.96 mg/dL). Liver Function Tests: Recent Labs  Lab  11/10/17 0539  AST 22  ALT 9*  ALKPHOS 63  BILITOT 0.7  PROT 6.4*  ALBUMIN 3.1*   No results for input(s): LIPASE, AMYLASE in the last 168 hours. No results for input(s): AMMONIA in the last 168 hours. Coagulation Profile: No results for input(s): INR, PROTIME in the last 168 hours. Cardiac Enzymes: No results for input(s): CKTOTAL, CKMB, CKMBINDEX, TROPONINI in the last 168 hours. BNP (last 3 results) No results for input(s): PROBNP in the last 8760 hours. HbA1C: No results for input(s): HGBA1C in the last 72 hours. CBG: No results for input(s): GLUCAP in the last 168 hours. Lipid Profile: No results for input(s): CHOL, HDL, LDLCALC, TRIG, CHOLHDL, LDLDIRECT in the last 72 hours. Thyroid Function Tests: No results for input(s): TSH, T4TOTAL, FREET4, T3FREE, THYROIDAB in the last 72 hours. Anemia Panel: Recent Labs    11/09/17 1636  VITAMINB12 532   Sepsis Labs: Recent Labs  Lab 11/09/17 1636  PROCALCITON <0.10    Recent Results (from the past 240 hour(s))  Culture, blood (Routine X 2) w Reflex to ID Panel     Status: None (Preliminary result)   Collection Time: 11/09/17  4:30 PM  Result Value Ref Range Status   Specimen Description BLOOD LEFT WRIST  Final   Special Requests   Final    BOTTLES DRAWN AEROBIC AND ANAEROBIC Blood Culture adequate volume   Culture  Setup Time   Final    GRAM POSITIVE COCCI IN CLUSTERS ANAEROBIC BOTTLE ONLY Organism ID to follow CRITICAL RESULT CALLED TO, READ BACK BY AND VERIFIED WITH: L. Foltanski Pharm.D. 11:55 11/11/17 ([email or web address])    Culture GRAM POSITIVE COCCI  Final   Report Status PENDING  Incomplete  Blood Culture ID Panel (Reflexed)     Status: Abnormal   Collection Time: 11/09/17  4:30 PM  Result Value Ref Range Status   Enterococcus species NOT DETECTED NOT DETECTED Final   Listeria monocytogenes NOT DETECTED NOT DETECTED Final   Staphylococcus species DETECTED (A) NOT DETECTED Final    Comment: Methicillin (oxacillin)  resistant coagulase negative staphylococcus. Possible blood culture contaminant (unless isolated from more than one blood culture draw or clinical case suggests pathogenicity). No antibiotic treatment is indicated for blood  culture contaminants. CRITICAL RESULT CALLED TO, READ BACK BY AND VERIFIED WITH: L. Foltanski Pharm.D. 11:55 11/11/17 ([email or web address])    Staphylococcus aureus NOT DETECTED NOT DETECTED Final   Methicillin resistance DETECTED (A) NOT DETECTED Final    Comment: CRITICAL RESULT CALLED TO, READ BACK BY AND VERIFIED WITH: L. Foltanski Pharm.D. 11:55 11/11/17 ([email or web address])    Streptococcus species NOT DETECTED NOT DETECTED Final   Streptococcus agalactiae NOT DETECTED NOT DETECTED Final   Streptococcus pneumoniae NOT DETECTED NOT DETECTED Final   Streptococcus pyogenes NOT DETECTED NOT DETECTED Final   Acinetobacter baumannii NOT DETECTED NOT DETECTED Final   Enterobacteriaceae species NOT DETECTED NOT DETECTED Final   Enterobacter cloacae complex NOT DETECTED NOT DETECTED Final   Escherichia coli NOT DETECTED NOT DETECTED Final   Klebsiella oxytoca NOT DETECTED NOT DETECTED Final   Klebsiella pneumoniae NOT DETECTED NOT DETECTED Final   Proteus species NOT DETECTED NOT DETECTED  Final   Serratia marcescens NOT DETECTED NOT DETECTED Final   Haemophilus influenzae NOT DETECTED NOT DETECTED Final   Neisseria meningitidis NOT DETECTED NOT DETECTED Final   Pseudomonas aeruginosa NOT DETECTED NOT DETECTED Final   Candida albicans NOT DETECTED NOT DETECTED Final   Candida glabrata NOT DETECTED NOT DETECTED Final   Candida krusei NOT DETECTED NOT DETECTED Final   Candida parapsilosis NOT DETECTED NOT DETECTED Final   Candida tropicalis NOT DETECTED NOT DETECTED Final  Culture, blood (Routine X 2) w Reflex to ID Panel     Status: None (Preliminary result)   Collection Time: 11/09/17  4:36 PM  Result Value Ref Range Status   Specimen Description BLOOD RIGHT HAND  Final   Special Requests    Final    BOTTLES DRAWN AEROBIC AND ANAEROBIC Blood Culture adequate volume   Culture NO GROWTH < 24 HOURS  Final   Report Status PENDING  Incomplete  Respiratory Panel by PCR     Status: Abnormal   Collection Time: 11/09/17  4:55 PM  Result Value Ref Range Status   Adenovirus NOT DETECTED NOT DETECTED Final   Coronavirus 229E NOT DETECTED NOT DETECTED Final   Coronavirus HKU1 NOT DETECTED NOT DETECTED Final   Coronavirus NL63 NOT DETECTED NOT DETECTED Final   Coronavirus OC43 NOT DETECTED NOT DETECTED Final   Metapneumovirus NOT DETECTED NOT DETECTED Final   Rhinovirus / Enterovirus NOT DETECTED NOT DETECTED Final   Influenza A NOT DETECTED NOT DETECTED Final   Influenza B NOT DETECTED NOT DETECTED Final   Parainfluenza Virus 1 NOT DETECTED NOT DETECTED Final   Parainfluenza Virus 2 NOT DETECTED NOT DETECTED Final   Parainfluenza Virus 3 NOT DETECTED NOT DETECTED Final   Parainfluenza Virus 4 NOT DETECTED NOT DETECTED Final   Respiratory Syncytial Virus DETECTED (A) NOT DETECTED Final    Comment: CRITICAL RESULT CALLED TO, READ BACK BY AND VERIFIED WITH: RN J MCBRIDE [phone] MLM    Bordetella pertussis NOT DETECTED NOT DETECTED Final   Chlamydophila pneumoniae NOT DETECTED NOT DETECTED Final   Mycoplasma pneumoniae NOT DETECTED NOT DETECTED Final         Radiology Studies: Dg Chest 2 View  Result Date: 11/09/2017 CLINICAL DATA:  Cough and wheezing for 1 week.  History smoking. EXAM: CHEST  2 VIEW COMPARISON:  11/08/2017. FINDINGS: The cardiac silhouette remains prominently enlarged. There is also evidence of a sizable hiatal hernia. Aortic atherosclerosis is noted. There is mild chronic peribronchial thickening. No confluent airspace opacity, overt edema, or pneumothorax is identified. No acute osseous abnormality is identified. IMPRESSION: 1. Chronic bronchitic changes without evidence of acute airspace disease. 2. Cardiomegaly and hiatal hernia. Electronically Signed    By: Sebastian Ache M.D.   On: 11/09/2017 15:24        Scheduled Meds: . amLODipine  10 mg Oral Daily  . budesonide (PULMICORT) nebulizer solution  0.25 mg Nebulization BID  . doxazosin  8 mg Oral Daily  . fluticasone  2 spray Each Nare Daily  . folic acid  1 mg Oral Daily  . levalbuterol  0.63 mg Nebulization Q6H  . levothyroxine  75 mcg Oral QAC breakfast  . methylPREDNISolone (SOLU-MEDROL) injection  60 mg Intravenous Daily  . metoprolol succinate  25 mg Oral Daily  . multivitamin with minerals  1 tablet Oral Daily  . pantoprazole  40 mg Oral Daily  . PARoxetine  40 mg Oral Daily  . potassium chloride  10 mEq Oral Daily  .  Rivaroxaban  15 mg Oral QPM  . sodium chloride flush  3 mL Intravenous Q12H  . thiamine  100 mg Oral Daily   Or  . thiamine  100 mg Intravenous Daily   Continuous Infusions:   LOS: 2 days     Jacquelin Hawking, MD [facility] 11/11/2017, 11:56 AM Pager: [phone]  If 7PM-7AM, please contact night-coverage [email or web address] Password [email or web address] 11/11/2017, 11:56 AM

## 2017-11-11 NOTE — Evaluation (Signed)
Occupational Therapy Evaluation Patient Details Name: Christina Ortiz MRN: [id number] DOB: [birth year] Today's Date: 11/11/2017    History of Present Illness Pt is an 83 y/o female admitted for increased cough, congestion and wheezing. Results revealed RSV. PMH includes CAD s/p stent placement, HTN, CKD, a fib, current smoker, and alcohol abuse.    Clinical Impression   This 83 y/o F presents with the above. Pt lives alone, at baseline is independent with ADLs and functional mobility. Pt completed room level functional mobility, seated and standing ADLs with overall MinGuard assist, with biggest limitation currently being dyspnea with activity. Pt on 4L O2 this session, with lowest sat 88% after room level ADL activity. Education provided on energy conservation techniques and handout provided, questions answered throughout. Feel Pt will safely return home with intermittent assist from family PRN. No further acute OT needs identified at this time. Will sign off.     Follow Up Recommendations  No OT follow up;Supervision - Intermittent    Equipment Recommendations  None recommended by OT           Precautions / Restrictions Precautions Precautions: Fall;Other (comment) Precaution Comments: watch O2 sats, bring tank.  Per RN she is no longer on droplet precautions.  Restrictions Weight Bearing Restrictions: No      Mobility Bed Mobility Overal bed mobility: Modified Independent             General bed mobility comments: Extra time needed to return to supine in bed   Transfers Overall transfer level: Needs assistance Equipment used: None Transfers: Sit to/from Stand Sit to Stand: Supervision         General transfer comment: supervision for safety    Balance Overall balance assessment: Needs assistance Sitting-balance support: Feet supported;Bilateral upper extremity supported Sitting balance-Leahy Scale: Good     Standing balance support: No upper extremity  supported;During functional activity Standing balance-Leahy Scale: Good                             ADL either performed or assessed with clinical judgement   ADL Overall ADL's : Needs assistance/impaired Eating/Feeding: Set up;Sitting   Grooming: Wash/dry hands;Min guard;Standing   Upper Body Bathing: Sitting;Set up   Lower Body Bathing: Min guard;Sit to/from stand   Upper Body Dressing : Set up;Sitting   Lower Body Dressing: Min guard;Sit to/from stand Lower Body Dressing Details (indicate cue type and reason): Pt using figure 4 technique to adjust socks seated in recliner  Toilet Transfer: Min guard;Ambulation;Comfort height toilet;Grab bars   Toileting- Clothing Manipulation and Hygiene: Min guard;Sit to/from stand Toileting - Clothing Manipulation Details (indicate cue type and reason): Pt completing clothing management and peri care with minguard for safety      Functional mobility during ADLs: Min guard General ADL Comments: Pt on 4L 02 during session, sats dropping to 88% after return to recliner from toileting, washing hands in bathroom, increased to 92-93% within approx 30 sec and instructions on deep breathing; educated on energy conservation strategies and handout issued                          Pertinent Vitals/Pain Pain Assessment: No/denies pain     Hand Dominance Right   Extremity/Trunk Assessment Upper Extremity Assessment Upper Extremity Assessment: Generalized weakness   Lower Extremity Assessment Lower Extremity Assessment: Defer to PT evaluation   Cervical / Trunk Assessment Cervical / Trunk Assessment: Kyphotic  Communication Communication Communication: HOH   Cognition Arousal/Alertness: Awake/alert Behavior During Therapy: WFL for tasks assessed/performed Overall Cognitive Status: Within Functional Limits for tasks assessed                                 General Comments: Pt is HOH which makes her seem  confused at times, but I think this is because she didn't hear what was said.    General Comments  Pt's daughter present beginning and end of session               Home Living Family/patient expects to be discharged to:: Private residence Living Arrangements: Alone Available Help at Discharge: Family;Available PRN/intermittently Type of Home: Other(Comment)(condo) Home Access: Level entry     Home Layout: One level     Bathroom Shower/Tub: Producer, television/film/videoWalk-in shower   Bathroom Toilet: Standard     Home Equipment: Environmental consultantWalker - 2 wheels;Cane - single point;Wheelchair - manual;Shower seat          Prior Functioning/Environment Level of Independence: Independent                 OT Problem List: Decreased strength;Cardiopulmonary status limiting activity      OT Treatment/Interventions:      OT Goals(Current goals can be found in the care plan section) Acute Rehab OT Goals Patient Stated Goal: to go home  OT Goal Formulation: All assessment and education complete, DC therapy                                 AM-PAC PT "6 Clicks" Daily Activity     Outcome Measure Help from another person eating meals?: None Help from another person taking care of personal grooming?: A Little Help from another person toileting, which includes using toliet, bedpan, or urinal?: A Little Help from another person bathing (including washing, rinsing, drying)?: A Little Help from another person to put on and taking off regular upper body clothing?: None Help from another person to put on and taking off regular lower body clothing?: A Little 6 Click Score: 20   End of Session Equipment Utilized During Treatment: Oxygen Nurse Communication: Mobility status;Other (comment)(adjusting O2 monitor (continuous beeping))  Activity Tolerance: Patient tolerated treatment well Patient left: in bed;with call bell/phone within reach  OT Visit Diagnosis: Muscle weakness (generalized) (M62.81)                 Time: [id number] OT Time Calculation (min): 22 min Charges:  OT General Charges $OT Visit: 1 Visit OT Evaluation $OT Eval Low Complexity: 1 Low G-Codes:     Marcy SirenBreanna Makenlee Mckeag, OT Pager [phone] 11/11/2017   Orlando PennerBreanna L [place] 11/11/2017, 4:37 PM

## 2017-11-11 NOTE — Progress Notes (Addendum)
PHARMACY - PHYSICIAN COMMUNICATION CRITICAL VALUE ALERT - BLOOD CULTURE IDENTIFICATION (BCID)  Results for orders placed or performed during the hospital encounter of 11/09/17  Blood Culture ID Panel (Reflexed) (Collected: 11/09/2017  4:30 PM)  Result Value Ref Range   Enterococcus species NOT DETECTED NOT DETECTED   Listeria monocytogenes NOT DETECTED NOT DETECTED   Staphylococcus species DETECTED (A) NOT DETECTED   Staphylococcus aureus NOT DETECTED NOT DETECTED   Methicillin resistance DETECTED (A) NOT DETECTED   Streptococcus species NOT DETECTED NOT DETECTED   Streptococcus agalactiae NOT DETECTED NOT DETECTED   Streptococcus pneumoniae NOT DETECTED NOT DETECTED   Streptococcus pyogenes NOT DETECTED NOT DETECTED   Acinetobacter baumannii NOT DETECTED NOT DETECTED   Enterobacteriaceae species NOT DETECTED NOT DETECTED   Enterobacter cloacae complex NOT DETECTED NOT DETECTED   Escherichia coli NOT DETECTED NOT DETECTED   Klebsiella oxytoca NOT DETECTED NOT DETECTED   Klebsiella pneumoniae NOT DETECTED NOT DETECTED   Proteus species NOT DETECTED NOT DETECTED   Serratia marcescens NOT DETECTED NOT DETECTED   Haemophilus influenzae NOT DETECTED NOT DETECTED   Neisseria meningitidis NOT DETECTED NOT DETECTED   Pseudomonas aeruginosa NOT DETECTED NOT DETECTED   Candida albicans NOT DETECTED NOT DETECTED   Candida glabrata NOT DETECTED NOT DETECTED   Candida krusei NOT DETECTED NOT DETECTED   Candida parapsilosis NOT DETECTED NOT DETECTED   Candida tropicalis NOT DETECTED NOT DETECTED    Name of physician (or Provider) Contacted: Dr. Caleb Popp (text paged)  Changes to prescribed antibiotics required: None. Coag negative staph is likely a contaminant. No antibiotics currently prescribed. BCID results consistent with viral pneumonia.   Kathyrn Sheriff, PharmD PGY1 Pharmacy Resident ID pharmacist phone # [phone] 11/11/17 12:09 PM

## 2017-11-11 NOTE — Consult Note (Signed)
Name: Christina Ortiz MRN: [id number] DOB: November 22, 1929    ADMISSION DATE:  11/09/2017 CONSULTATION DATE:  11/16  REFERRING MD : Dr. Caleb Popp  CHIEF COMPLAINT:  Hypoxia  HISTORY OF PRESENT ILLNESS: 83 year old female with past medical history as below, which is significant for coronary artery disease status post PCI, paroxysmal atrial fibrillation on Xarelto, hypothyroidism, chronic kidney disease, and hypertension.  She has approximately a 60-pack-year history of smoking and quit 3 years ago.  In her usual state of health she reports not being limited by shortness of breath at all.  She was in her usual state of health until about 2 weeks ago when she was visiting her brother in [place].  She flew there to visit him and sat next to someone who is coughing with "bronchitis" on the plane ride.  While there she took up her old habits and began smoking and drinking with the rest of her family/friends.  She started feeling sick about 2 days into the trip and was complaining of a nonproductive cough, fevers, chills.  She does complain of some lower extremity edema, but it is equal bilaterally and there is no associated cramping.  She presented to her PCP in 11/13 and was started on azithromycin. Shortness of breath became progressively worse prompting her to report to the emergency department on 11/14 where she was admitted for acute bronchitis with IV antibiotics and supplemental oxygen.  Respiratory viral panel tested positive for RSV.  Antibiotics were stopped.  Oxygen demands escalating to 4L.  PCCM consulted.  SIGNIFICANT EVENTS    STUDIES:  CXR on admit with chronic bronchitic changes but no acute infiltrate.   PAST MEDICAL HISTORY :   has a past medical history of Acute bronchitis (10/2017), Arthritis, CKD (chronic kidney disease), Coronary artery disease, Depression, Gastritis, History of cholecystectomy, History of echocardiogram, Hyperlipidemia, Hypertension, Hypokalemia, Mobitz type 1  second degree atrioventricular block, PAF (paroxysmal atrial fibrillation) (HCC), and Thyroid disease.  has a past surgical history that includes Dilation and curettage of uterus; Breast lumpectomy (Left); Cardiac catheterization; Cataract extraction (Bilateral); Cholecystectomy; and Eye surgery. Prior to Admission medications   Medication Sig Start Date End Date Taking? Authorizing Provider  amLODipine (NORVASC) 10 MG tablet Take 1 tablet (10 mg total) by mouth daily. 09/06/17  Yes Danford, Orpha Bur D, NP  azithromycin (ZITHROMAX) 250 MG tablet 2 tabs by mouth day one,  1 tabs days 2- 5 11/08/17  Yes Danford, Orpha Bur D, NP  benzonatate (TESSALON) 100 MG capsule Take 1 capsule (100 mg total) 2 (two) times daily as needed by mouth for cough. 11/08/17  Yes Danford, Orpha Bur D, NP  carboxymethylcellul-glycerin ([facility] LUBRICATING/DRY EYE) 0.5-0.9 % ophthalmic solution Place 2 drops as needed into both eyes for dry eyes.   Yes [provider]  doxazosin (CARDURA) 8 MG tablet Take 1 tablet (8 mg total) by mouth daily. 09/06/17  Yes Danford, Orpha Bur D, NP  fluticasone (FLONASE) 50 MCG/ACT nasal spray Place 2 sprays daily into both nostrils. 11/08/17  Yes Danford, Orpha Bur D, NP  metoprolol succinate (TOPROL-XL) 25 MG 24 hr tablet Take 1 tablet (25 mg total) by mouth daily. 09/07/17  Yes Jake Bathe, MD  omeprazole (PRILOSEC) 20 MG capsule Take 1 capsule (20 mg total) by mouth daily as needed (heartburn). 09/06/17  Yes Danford, Orpha Bur D, NP  PARoxetine (PAXIL) 40 MG tablet Take 1 tablet (40 mg total) by mouth daily. 09/06/17  Yes Danford, Orpha Bur D, NP  potassium chloride (K-DUR) 10 MEQ tablet Take 1 tablet (  10 mEq total) by mouth daily. 09/06/17  Yes Danford, Orpha Bur D, NP  quinapril (ACCUPRIL) 40 MG tablet Take 1 tablet (40 mg total) by mouth daily. 09/06/17  Yes Danford, Orpha Bur D, NP  simvastatin (ZOCOR) 5 MG tablet Take 1 tablet (5 mg total) by mouth every evening. 09/06/17  Yes Danford, [name], NP  XARELTO 15 MG TABS tablet TAKE  1 TABLET (15 MG TOTAL) BY MOUTH DAILY WITH SUPPER. 11/08/17  Yes Jake Bathe, MD  levothyroxine (SYNTHROID, LEVOTHROID) 75 MCG tablet Take 1 tablet (75 mcg total) daily before breakfast by mouth. 11/10/17   Danford, [name], NP  XARELTO 15 MG TABS tablet TAKE 1 TABLET BY MOUTH EVERY EVENING Patient not taking: Reported on 11/09/2017 09/06/17   Jake Bathe, MD   Allergies  Allergen Reactions  . Tape Other (See Comments)    Patient's skin is thin and will TEAR AND BRUISE EASILY    FAMILY HISTORY:  family history includes Cancer in her father; Heart attack in her mother; Heart disease in her mother; Lung cancer in her father. SOCIAL HISTORY:  reports that she quit smoking about 2 years ago. Her smoking use included cigarettes. She has a 33.50 pack-year smoking history. she has never used smokeless tobacco. She reports that she drinks about 6.0 oz of alcohol per week. She reports that she does not use drugs.  REVIEW OF SYSTEMS:   Bolds are positive  Constitutional: weight loss, gain, night sweats, Fevers, chills, fatigue .  HEENT: headaches, Sore throat, sneezing, nasal congestion, post nasal drip, Difficulty swallowing, Tooth/dental problems, visual complaints visual changes, ear ache CV:  chest pain, radiates:,Orthopnea, PND, swelling in lower extremities, dizziness, palpitations, syncope.  GI  heartburn, indigestion, abdominal pain, nausea, vomiting, diarrhea, change in bowel habits, loss of appetite, bloody stools.  Resp: cough, productive: , hemoptysis, dyspnea, chest pain, pleuritic.  Skin: rash or itching or icterus GU: dysuria, change in color of urine, urgency or frequency. flank pain, hematuria  MS: joint pain or swelling. decreased range of motion  Psych: change in mood or affect. depression or anxiety.  Neuro: difficulty with speech, weakness, numbness, ataxia    SUBJECTIVE:   VITAL SIGNS: Temp:  [97.5 F (36.4 C)-97.9 F (36.6 C)] 97.5 F (36.4 C) (11/16 0500) Pulse  Rate:  [65-77] 77 (11/16 0500) Resp:  [16] 16 (11/16 0500) BP: (125-145)/(63-91) 145/91 (11/16 0500) SpO2:  [89 %-97 %] 94 % (11/16 0841) Weight:  [73.2 kg (161 lb 6 oz)] 73.2 kg (161 lb 6 oz) (11/16 0408)  PHYSICAL EXAMINATION: General:  Overweight elderly female in NAD Neuro:  Alert, oriented, non-focal.  HEENT:  [place]/AT, PERRL, no JVD Cardiovascular:  RRR, no MRG Lungs:  Scattered rhonchi. Wheeze is largely upper airway. Unlabored on 4L [place] Abdomen:  Soft, non-tender, non-distended Musculoskeletal:  No acute deformity. +1 edema Skin:  Grossly intact  Recent Labs  Lab 11/09/17 1113 11/10/17 0539  NA 140 141  K 3.3* 4.3  CL 100* 106  CO2 30 27  BUN 11 19  CREATININE 0.97 0.96  GLUCOSE 147* 147*   Recent Labs  Lab 11/09/17 1113 11/09/17 1636 11/10/17 0539  HGB 12.7  --  11.3*  HCT 39.3 35.2 35.7*  WBC 6.5  --  5.9  PLT 157  --  131*   Dg Chest 2 View  Result Date: 11/09/2017 CLINICAL DATA:  Cough and wheezing for 1 week.  History smoking. EXAM: CHEST  2 VIEW COMPARISON:  11/08/2017. FINDINGS: The cardiac silhouette  remains prominently enlarged. There is also evidence of a sizable hiatal hernia. Aortic atherosclerosis is noted. There is mild chronic peribronchial thickening. No confluent airspace opacity, overt edema, or pneumothorax is identified. No acute osseous abnormality is identified. IMPRESSION: 1. Chronic bronchitic changes without evidence of acute airspace disease. 2. Cardiomegaly and hiatal hernia. Electronically Signed   By: Sebastian AcheAllen  Grady M.D.   On: 11/09/2017 15:24    ASSESSMENT / PLAN:  Acute hypoxemic respiratory failure secondary to viral bronchitis. RSV positive. PCT < 0.1 - Continue supplemental O2 with SpO2 goal > 90% (current 4L) - Agree to DC antibiotics.  - Reasonable to continue short course PO steroids. (3-5 days total) - Should improve over time. If not improving would pursue DVT/PE workup given recent air travel, but denies related symptoms.  Wells score 1: Low prob  Possible COPD - No SOB with exertion at baseline. No PFTs. Wheeze largely upper airway.  - PRN xopenex - DC budesonide, scheduled xopenex - PFTs once acute illness resolved if she wishes to pursue this - Should she worsen and develop bronchospasm we can revisit this.   Joneen RoachPaul Hoffman, AGACNP-BC [facility] Pager [phone] or [phone]  11/11/2017 12:05 PM  Attending Note:  83 year old female with no documented history of COPD who presents to the hospital with RSV and SOB.  On exam, no wheezes noted.  I reviewed CXR myself, no acute disease noted.  Discussed with [facility]-MD and PCCM-NP.  Patient has no documented history of COPD and is not acting as a COPD exacerbation.    SOB: due to RSV  - PT/OT  - OOB  Hypoxemia:  - Titrate O2 for sat of 88-92%  - If in 72 hours remains hypoxemic will need an ambulatory desat study for home O2  - Would not be unheard off if patient goes home with O2 temporarily   COPD:  - Would end the steroid course quickly, not an exacerbation  - Xopenex PRN  - D/C inhaled steroids, not on it at home and no bronchspasm  RSV:  - Supportive care  PCCM will sign off, please call back if needed  Patient seen and examined, agree with above note.  I dictated the care and orders written for this patient under my direction.  Alyson ReedyYacoub, Soren Lazarz G, MD [phone]

## 2017-11-11 NOTE — Progress Notes (Signed)
Report obtained from prior nurse.  Charge nurse to [place] patients until I arrive back to unit around 1620 after lunch

## 2017-11-11 NOTE — Progress Notes (Signed)
Pt lying in bed awake.  States, "doing much better now."  Fine wheezing noted upper airway.  Satting 94% on 4L O2 via [place].  No s/s of any acute distress noted.

## 2017-11-12 NOTE — Care Management Note (Signed)
Case Management Note  Patient Details  Name: Christina Ortiz MRN: [id number] Date of Birth: 03/29/1929  Subjective/Objective:  Spoke with Scarlette Calico, who is patients bedside RN today. She is currently decreasing O2 (3L/M via [place]) in hopes of weaning to no O2 requirement. Aware of the need for O2 saturation note if home O2 required.                   Action/Plan:CM will follow closely for disposition/discharge needs.    Expected Discharge Date:                  Expected Discharge Plan:  Home w Home Health Services  In-House Referral:     Discharge planning Services  CM Consult  Post Acute Care Choice:  Home Health, Durable Medical Equipment Choice offered to:  Patient  DME Arranged:  Oxygen DME Agency:     HH Arranged:  PT HH Agency:  [facility]  Status of Service:  In process, will continue to follow  If discussed at Long Length of Stay Meetings, dates discussed:    Additional Comments:  Yvone Neu, RN 11/12/2017, 9:55 AM

## 2017-11-12 NOTE — Progress Notes (Signed)
PROGRESS NOTE    Christina Ortiz  ITG:[id number] DOB: 10-22-1929 DOA: 11/09/2017 PCP: William Hamburger D, NP   Brief Narrative: Christina Ortiz is a 83 y.o. female with a history of CAD status post stents, paroxysmal atrial fibrillation on Xarelto, hypothyroidism, essential hypertension, CKD, tobacco abuse.  Patient presented with dyspnea and cough concerning for viral respiratory infection.  She required oxygen via nasal cannula for hypoxia.  Respiratory viral panel was positive for respiratory syncytial virus  Assessment & Plan:   Principal Problem:   Acute bronchitis Active Problems:   Acute respiratory failure with hypoxia (HCC)   Hypertension   PAF (paroxysmal atrial fibrillation) (HCC)   Coronary artery disease   CKD (chronic kidney disease) stage 2, GFR 60-89 ml/min   Hypothyroidism   Lower respiratory infection secondary to RSV Acute respiratory failure with hypoxia Viral panel significant for RSV infection.  Patient with upper respiratory wheezing.  It is possible she has an underlying lung disease with history of smoking. Oxygen requirement stable -Continue nebulizer treatments -Supportive care with oxygen as needed to keep O2 sats between 88-92% -pulmonology recommendations: short course steroids, discontinue inhaled corticosteroids, discharge home with oxygen if no improvement in 2-3 days.  Hypothyroidism -Continue Synthroid  CKD stage II Stable.  Essential hypertension Stable. -Continue metoprolol and amlodipine  Paroxysmal atrial fibrillation Rate controlled. -Continue metoprolol -Continue Xarelto  Depression -Continue Paxil  Alcoholic binge drinking Secondary to recent loss of husband per chart review. CIWA scores of zero. -Continue CIWA  Macrocytosis Mild.  Likely secondary to alcohol use.  Vitamin B12 and folate within normal limits -outpatient follow-up    DVT prophylaxis: Xarelto Code Status: Full code Family Communication: None at  bedside Disposition Plan: Discharge home with home health PT likely in two days, unless weans off of oxygen sooner   Consultants:   None  Procedures:   None  Antimicrobials:  Ceftriaxone (11/14>>11/15)  Azithromycin (11/14>>11/15)   Subjective: Cough without sputum. No chest pain. Mild dyspnea.  Objective: Vitals:   11/11/17 1630 11/11/17 1800 11/11/17 2210 11/12/17 0547  BP:   (!) 147/81 (!) 168/98  Pulse:   66 86  Resp:   16 17  Temp:   98.2 F (36.8 C) 98 F (36.7 C)  TempSrc:   Oral Oral  SpO2: 94% 95% 94% 97%  Weight:    73.1 kg (161 lb 2.5 oz)  Height:        Intake/Output Summary (Last 24 hours) at 11/12/2017 0931 Last data filed at 11/12/2017 8309 Gross per 24 hour  Intake 760 ml  Output 1100 ml  Net -340 ml   Filed Weights   11/10/17 0519 11/11/17 0408 11/12/17 0547  Weight: 73.2 kg (161 lb 6 oz) 73.2 kg (161 lb 6 oz) 73.1 kg (161 lb 2.5 oz)    Examination:  General exam: Appears calm and comfortable Respiratory system: Diminished breath sounds.  No wheezing or rhonchi. Cardiovascular system: S1 & S2 heard, RRR. No murmurs. Gastrointestinal system: Abdomen is nondistended, soft and nontender. Normal bowel sounds heard. Central nervous system: Alert and oriented. No focal neurological deficits. Extremities: No edema. No calf tenderness Skin: No cyanosis. No rashes Psychiatry: Judgement and insight appear normal. Mood & affect appropriate.     Data Reviewed: I have personally reviewed following labs and imaging studies  CBC: Recent Labs  Lab 11/09/17 1113 11/09/17 1636 11/10/17 0539  WBC 6.5  --  5.9  HGB 12.7  --  11.3*  HCT 39.3 35.2 35.7*  MCV 102.9*  --  103.2*  PLT 157  --  131*   Basic Metabolic Panel: Recent Labs  Lab 11/09/17 1113 11/10/17 0539  NA 140 141  K 3.3* 4.3  CL 100* 106  CO2 30 27  GLUCOSE 147* 147*  BUN 11 19  CREATININE 0.97 0.96  CALCIUM 9.1 8.3*   GFR: Estimated Creatinine Clearance: 40.3 mL/min  (by C-G formula based on SCr of 0.96 mg/dL). Liver Function Tests: Recent Labs  Lab 11/10/17 0539  AST 22  ALT 9*  ALKPHOS 63  BILITOT 0.7  PROT 6.4*  ALBUMIN 3.1*   No results for input(s): LIPASE, AMYLASE in the last 168 hours. No results for input(s): AMMONIA in the last 168 hours. Coagulation Profile: No results for input(s): INR, PROTIME in the last 168 hours. Cardiac Enzymes: No results for input(s): CKTOTAL, CKMB, CKMBINDEX, TROPONINI in the last 168 hours. BNP (last 3 results) No results for input(s): PROBNP in the last 8760 hours. HbA1C: No results for input(s): HGBA1C in the last 72 hours. CBG: No results for input(s): GLUCAP in the last 168 hours. Lipid Profile: No results for input(s): CHOL, HDL, LDLCALC, TRIG, CHOLHDL, LDLDIRECT in the last 72 hours. Thyroid Function Tests: No results for input(s): TSH, T4TOTAL, FREET4, T3FREE, THYROIDAB in the last 72 hours. Anemia Panel: Recent Labs    11/09/17 1636  VITAMINB12 532   Sepsis Labs: Recent Labs  Lab 11/09/17 1636  PROCALCITON <0.10    Recent Results (from the past 240 hour(s))  Culture, blood (Routine X 2) w Reflex to ID Panel     Status: None (Preliminary result)   Collection Time: 11/09/17  4:30 PM  Result Value Ref Range Status   Specimen Description BLOOD LEFT WRIST  Final   Special Requests   Final    BOTTLES DRAWN AEROBIC AND ANAEROBIC Blood Culture adequate volume   Culture  Setup Time   Final    GRAM POSITIVE COCCI IN CLUSTERS ANAEROBIC BOTTLE ONLY Organism ID to follow CRITICAL RESULT CALLED TO, READ BACK BY AND VERIFIED WITH: L. Foltanski Pharm.D. 11:55 11/11/17 ([email or web address])    Culture GRAM POSITIVE COCCI  Final   Report Status PENDING  Incomplete  Blood Culture ID Panel (Reflexed)     Status: Abnormal   Collection Time: 11/09/17  4:30 PM  Result Value Ref Range Status   Enterococcus species NOT DETECTED NOT DETECTED Final   Listeria monocytogenes NOT DETECTED NOT DETECTED Final    Staphylococcus species DETECTED (A) NOT DETECTED Final    Comment: Methicillin (oxacillin) resistant coagulase negative staphylococcus. Possible blood culture contaminant (unless isolated from more than one blood culture draw or clinical case suggests pathogenicity). No antibiotic treatment is indicated for blood  culture contaminants. CRITICAL RESULT CALLED TO, READ BACK BY AND VERIFIED WITH: L. Foltanski Pharm.D. 11:55 11/11/17 ([email or web address])    Staphylococcus aureus NOT DETECTED NOT DETECTED Final   Methicillin resistance DETECTED (A) NOT DETECTED Final    Comment: CRITICAL RESULT CALLED TO, READ BACK BY AND VERIFIED WITH: L. Foltanski Pharm.D. 11:55 11/11/17 ([email or web address])    Streptococcus species NOT DETECTED NOT DETECTED Final   Streptococcus agalactiae NOT DETECTED NOT DETECTED Final   Streptococcus pneumoniae NOT DETECTED NOT DETECTED Final   Streptococcus pyogenes NOT DETECTED NOT DETECTED Final   Acinetobacter baumannii NOT DETECTED NOT DETECTED Final   Enterobacteriaceae species NOT DETECTED NOT DETECTED Final   Enterobacter cloacae complex NOT DETECTED NOT DETECTED Final   Escherichia coli NOT DETECTED NOT DETECTED Final   Klebsiella oxytoca NOT DETECTED NOT DETECTED  Final   Klebsiella pneumoniae NOT DETECTED NOT DETECTED Final   Proteus species NOT DETECTED NOT DETECTED Final   Serratia marcescens NOT DETECTED NOT DETECTED Final   Haemophilus influenzae NOT DETECTED NOT DETECTED Final   Neisseria meningitidis NOT DETECTED NOT DETECTED Final   Pseudomonas aeruginosa NOT DETECTED NOT DETECTED Final   Candida albicans NOT DETECTED NOT DETECTED Final   Candida glabrata NOT DETECTED NOT DETECTED Final   Candida krusei NOT DETECTED NOT DETECTED Final   Candida parapsilosis NOT DETECTED NOT DETECTED Final   Candida tropicalis NOT DETECTED NOT DETECTED Final  Culture, blood (Routine X 2) w Reflex to ID Panel     Status: None (Preliminary result)   Collection Time: 11/09/17  4:36 PM    Result Value Ref Range Status   Specimen Description BLOOD RIGHT HAND  Final   Special Requests   Final    BOTTLES DRAWN AEROBIC AND ANAEROBIC Blood Culture adequate volume   Culture NO GROWTH 2 DAYS  Final   Report Status PENDING  Incomplete  Respiratory Panel by PCR     Status: Abnormal   Collection Time: 11/09/17  4:55 PM  Result Value Ref Range Status   Adenovirus NOT DETECTED NOT DETECTED Final   Coronavirus 229E NOT DETECTED NOT DETECTED Final   Coronavirus HKU1 NOT DETECTED NOT DETECTED Final   Coronavirus NL63 NOT DETECTED NOT DETECTED Final   Coronavirus OC43 NOT DETECTED NOT DETECTED Final   Metapneumovirus NOT DETECTED NOT DETECTED Final   Rhinovirus / Enterovirus NOT DETECTED NOT DETECTED Final   Influenza A NOT DETECTED NOT DETECTED Final   Influenza B NOT DETECTED NOT DETECTED Final   Parainfluenza Virus 1 NOT DETECTED NOT DETECTED Final   Parainfluenza Virus 2 NOT DETECTED NOT DETECTED Final   Parainfluenza Virus 3 NOT DETECTED NOT DETECTED Final   Parainfluenza Virus 4 NOT DETECTED NOT DETECTED Final   Respiratory Syncytial Virus DETECTED (A) NOT DETECTED Final    Comment: CRITICAL RESULT CALLED TO, READ BACK BY AND VERIFIED WITH: RN J MCBRIDE [id number]    Bordetella pertussis NOT DETECTED NOT DETECTED Final   Chlamydophila pneumoniae NOT DETECTED NOT DETECTED Final   Mycoplasma pneumoniae NOT DETECTED NOT DETECTED Final         Radiology Studies: No results found.      Scheduled Meds: . amLODipine  10 mg Oral Daily  . doxazosin  8 mg Oral Daily  . fluticasone  2 spray Each Nare Daily  . folic acid  1 mg Oral Daily  . levothyroxine  75 mcg Oral QAC breakfast  . metoprolol succinate  25 mg Oral Daily  . multivitamin with minerals  1 tablet Oral Daily  . pantoprazole  40 mg Oral Daily  . PARoxetine  40 mg Oral Daily  . potassium chloride  10 mEq Oral Daily  . predniSONE  50 mg Oral Q breakfast  . Rivaroxaban  15 mg Oral QPM  . sodium  chloride flush  3 mL Intravenous Q12H  . thiamine  100 mg Oral Daily   Or  . thiamine  100 mg Intravenous Daily   Continuous Infusions:   LOS: 3 days     Jacquelin Hawkingalph Clover Feehan, MD [facility] 11/12/2017, 9:31 AM Pager: [phone]  If 7PM-7AM, please contact night-coverage [email or web address] Password [email or web address] 11/12/2017, 9:31 AM

## 2017-11-13 LAB — CULTURE, BLOOD (ROUTINE X 2): SPECIAL REQUESTS: ADEQUATE

## 2017-11-13 NOTE — Progress Notes (Signed)
SATURATION QUALIFICATIONS: (This note is used to comply with regulatory documentation for home oxygen)  Patient Saturations on Room Air at Rest = 92%  Patient Saturations on Room Air while Ambulating = 86%  Patient Saturations on 2 Liters of oxygen while Ambulating = 90-91%  Please briefly explain why patient needs home oxygen: Lives alone  Severe wheezing Desaturates with activity with O2 at 1-2 liters

## 2017-11-13 NOTE — Progress Notes (Signed)
PROGRESS NOTE    Christina Ortiz  ZOX:[id number] DOB: 1929/04/13 DOA: 11/09/2017 PCP: William Hamburger D, NP   Brief Narrative: Christina Ortiz is a 83 y.o. female with a history of CAD status post stents, paroxysmal atrial fibrillation on Xarelto, hypothyroidism, essential hypertension, CKD, tobacco abuse.  Patient presented with dyspnea and cough concerning for viral respiratory infection.  She required oxygen via nasal cannula for hypoxia.  Respiratory viral panel was positive for respiratory syncytial virus  Assessment & Plan:   Principal Problem:   Acute bronchitis due to respiratory syncytial virus (RSV) Active Problems:   Acute respiratory failure with hypoxia (HCC)   Hypertension   PAF (paroxysmal atrial fibrillation) (HCC)   Coronary artery disease   CKD (chronic kidney disease) stage 2, GFR 60-89 ml/min   Hypothyroidism   Lower respiratory infection secondary to RSV Acute respiratory failure with hypoxia Viral panel significant for RSV infection.  Patient with upper respiratory wheezing.  It is possible she has an underlying lung disease with history of smoking. Oxygen requirement stable -Continue nebulizer treatments -Supportive care with oxygen as needed to keep O2 sats between 88-92% -pulmonology recommendations: short course steroids, discontinue inhaled corticosteroids, discharge home with oxygen if no improvement by tomorrow. Will need ambulating O2 saturations  Hypothyroidism -Continue Synthroid  CKD stage II Stable.  Essential hypertension Stable. -Continue metoprolol and amlodipine  Paroxysmal atrial fibrillation Rate controlled. -Continue metoprolol -Continue Xarelto  Depression -Continue Paxil  Alcoholic binge drinking Secondary to recent loss of husband per chart review. CIWA scores of zero. -Continue CIWA  Macrocytosis Mild.  Likely secondary to alcohol use.  Vitamin B12 and folate within normal limits -outpatient follow-up    DVT  prophylaxis: Xarelto Code Status: Full code Family Communication: None at bedside Disposition Plan: Discharge home with home health PT in 24 hours if remains stable and able to wean to room air   Consultants:   None  Procedures:   None  Antimicrobials:  Ceftriaxone (11/14>>11/15)  Azithromycin (11/14>>11/15)   Subjective: Mild cough which is improved. No dyspnea.  Objective: Vitals:   11/12/17 2144 11/13/17 0500 11/13/17 0532 11/13/17 0842  BP: (!) 140/96  (!) 156/94   Pulse: 78  83   Resp: 16  16   Temp: 97.8 F (36.6 C)  (!) 97.4 F (36.3 C)   TempSrc: Oral  Oral   SpO2: 95%  95% 94%  Weight:  73.1 kg (161 lb 2.5 oz)    Height:        Intake/Output Summary (Last 24 hours) at 11/13/2017 1231 Last data filed at 11/13/2017 0807 Gross per 24 hour  Intake 822 ml  Output 3003 ml  Net -2181 ml   Filed Weights   11/11/17 0408 11/12/17 0547 11/13/17 0500  Weight: 73.2 kg (161 lb 6 oz) 73.1 kg (161 lb 2.5 oz) 73.1 kg (161 lb 2.5 oz)    Examination:  General exam: Appears calm and comfortable Respiratory system: Diminished breath sounds.  No wheezing or rhonchi. Cardiovascular system: S1 & S2 heard, RRR. No murmurs. Gastrointestinal system: Abdomen is nondistended, soft and nontender. Normal bowel sounds heard. Central nervous system: Alert and oriented. No focal neurological deficits. Extremities: No edema. No calf tenderness Skin: No cyanosis. No rashes Psychiatry: Judgement and insight appear normal. Mood & affect appropriate.     Data Reviewed: I have personally reviewed following labs and imaging studies  CBC: Recent Labs  Lab 11/09/17 1113 11/09/17 1636 11/10/17 0539  WBC 6.5  --  5.9  HGB 12.7  --  11.3*  HCT 39.3 35.2 35.7*  MCV 102.9*  --  103.2*  PLT 157  --  131*   Basic Metabolic Panel: Recent Labs  Lab 11/09/17 1113 11/10/17 0539  NA 140 141  K 3.3* 4.3  CL 100* 106  CO2 30 27  GLUCOSE 147* 147*  BUN 11 19  CREATININE 0.97  0.96  CALCIUM 9.1 8.3*   GFR: Estimated Creatinine Clearance: 40.3 mL/min (by C-G formula based on SCr of 0.96 mg/dL). Liver Function Tests: Recent Labs  Lab 11/10/17 0539  AST 22  ALT 9*  ALKPHOS 63  BILITOT 0.7  PROT 6.4*  ALBUMIN 3.1*   No results for input(s): LIPASE, AMYLASE in the last 168 hours. No results for input(s): AMMONIA in the last 168 hours. Coagulation Profile: No results for input(s): INR, PROTIME in the last 168 hours. Cardiac Enzymes: No results for input(s): CKTOTAL, CKMB, CKMBINDEX, TROPONINI in the last 168 hours. BNP (last 3 results) No results for input(s): PROBNP in the last 8760 hours. HbA1C: No results for input(s): HGBA1C in the last 72 hours. CBG: No results for input(s): GLUCAP in the last 168 hours. Lipid Profile: No results for input(s): CHOL, HDL, LDLCALC, TRIG, CHOLHDL, LDLDIRECT in the last 72 hours. Thyroid Function Tests: No results for input(s): TSH, T4TOTAL, FREET4, T3FREE, THYROIDAB in the last 72 hours. Anemia Panel: No results for input(s): VITAMINB12, FOLATE, FERRITIN, TIBC, IRON, RETICCTPCT in the last 72 hours. Sepsis Labs: Recent Labs  Lab 11/09/17 1636  PROCALCITON <0.10    Recent Results (from the past 240 hour(s))  Culture, blood (Routine X 2) w Reflex to ID Panel     Status: Abnormal   Collection Time: 11/09/17  4:30 PM  Result Value Ref Range Status   Specimen Description BLOOD LEFT WRIST  Final   Special Requests   Final    BOTTLES DRAWN AEROBIC AND ANAEROBIC Blood Culture adequate volume   Culture  Setup Time   Final    GRAM POSITIVE COCCI IN CLUSTERS ANAEROBIC BOTTLE ONLY CRITICAL RESULT CALLED TO, READ BACK BY AND VERIFIED WITH: L. Foltanski Pharm.D. 11:55 11/11/17 ([email or web address])    Culture (A)  Final    STAPHYLOCOCCUS SPECIES (COAGULASE NEGATIVE) THE SIGNIFICANCE OF ISOLATING THIS ORGANISM FROM A SINGLE SET OF BLOOD CULTURES WHEN MULTIPLE SETS ARE DRAWN IS UNCERTAIN. PLEASE NOTIFY THE MICROBIOLOGY DEPARTMENT  WITHIN ONE WEEK IF SPECIATION AND SENSITIVITIES ARE REQUIRED.    Report Status 11/13/2017 FINAL  Final  Blood Culture ID Panel (Reflexed)     Status: Abnormal   Collection Time: 11/09/17  4:30 PM  Result Value Ref Range Status   Enterococcus species NOT DETECTED NOT DETECTED Final   Listeria monocytogenes NOT DETECTED NOT DETECTED Final   Staphylococcus species DETECTED (A) NOT DETECTED Final    Comment: Methicillin (oxacillin) resistant coagulase negative staphylococcus. Possible blood culture contaminant (unless isolated from more than one blood culture draw or clinical case suggests pathogenicity). No antibiotic treatment is indicated for blood  culture contaminants. CRITICAL RESULT CALLED TO, READ BACK BY AND VERIFIED WITH: L. Foltanski Pharm.D. 11:55 11/11/17 ([email or web address])    Staphylococcus aureus NOT DETECTED NOT DETECTED Final   Methicillin resistance DETECTED (A) NOT DETECTED Final    Comment: CRITICAL RESULT CALLED TO, READ BACK BY AND VERIFIED WITH: L. Foltanski Pharm.D. 11:55 11/11/17 ([email or web address])    Streptococcus species NOT DETECTED NOT DETECTED Final   Streptococcus agalactiae NOT DETECTED NOT DETECTED Final   Streptococcus pneumoniae NOT DETECTED NOT DETECTED Final   Streptococcus pyogenes  NOT DETECTED NOT DETECTED Final   Acinetobacter baumannii NOT DETECTED NOT DETECTED Final   Enterobacteriaceae species NOT DETECTED NOT DETECTED Final   Enterobacter cloacae complex NOT DETECTED NOT DETECTED Final   Escherichia coli NOT DETECTED NOT DETECTED Final   Klebsiella oxytoca NOT DETECTED NOT DETECTED Final   Klebsiella pneumoniae NOT DETECTED NOT DETECTED Final   Proteus species NOT DETECTED NOT DETECTED Final   Serratia marcescens NOT DETECTED NOT DETECTED Final   Haemophilus influenzae NOT DETECTED NOT DETECTED Final   Neisseria meningitidis NOT DETECTED NOT DETECTED Final   Pseudomonas aeruginosa NOT DETECTED NOT DETECTED Final   Candida albicans NOT DETECTED NOT DETECTED  Final   Candida glabrata NOT DETECTED NOT DETECTED Final   Candida krusei NOT DETECTED NOT DETECTED Final   Candida parapsilosis NOT DETECTED NOT DETECTED Final   Candida tropicalis NOT DETECTED NOT DETECTED Final  Culture, blood (Routine X 2) w Reflex to ID Panel     Status: None (Preliminary result)   Collection Time: 11/09/17  4:36 PM  Result Value Ref Range Status   Specimen Description BLOOD RIGHT HAND  Final   Special Requests   Final    BOTTLES DRAWN AEROBIC AND ANAEROBIC Blood Culture adequate volume   Culture NO GROWTH 4 DAYS  Final   Report Status PENDING  Incomplete  Respiratory Panel by PCR     Status: Abnormal   Collection Time: 11/09/17  4:55 PM  Result Value Ref Range Status   Adenovirus NOT DETECTED NOT DETECTED Final   Coronavirus 229E NOT DETECTED NOT DETECTED Final   Coronavirus HKU1 NOT DETECTED NOT DETECTED Final   Coronavirus NL63 NOT DETECTED NOT DETECTED Final   Coronavirus OC43 NOT DETECTED NOT DETECTED Final   Metapneumovirus NOT DETECTED NOT DETECTED Final   Rhinovirus / Enterovirus NOT DETECTED NOT DETECTED Final   Influenza A NOT DETECTED NOT DETECTED Final   Influenza B NOT DETECTED NOT DETECTED Final   Parainfluenza Virus 1 NOT DETECTED NOT DETECTED Final   Parainfluenza Virus 2 NOT DETECTED NOT DETECTED Final   Parainfluenza Virus 3 NOT DETECTED NOT DETECTED Final   Parainfluenza Virus 4 NOT DETECTED NOT DETECTED Final   Respiratory Syncytial Virus DETECTED (A) NOT DETECTED Final    Comment: CRITICAL RESULT CALLED TO, READ BACK BY AND VERIFIED WITH: RN J MCBRIDE [id number] MLM    Bordetella pertussis NOT DETECTED NOT DETECTED Final   Chlamydophila pneumoniae NOT DETECTED NOT DETECTED Final   Mycoplasma pneumoniae NOT DETECTED NOT DETECTED Final         Radiology Studies: No results found.      Scheduled Meds: . amLODipine  10 mg Oral Daily  . doxazosin  8 mg Oral Daily  . fluticasone  2 spray Each Nare Daily  . folic acid  1 mg  Oral Daily  . levothyroxine  75 mcg Oral QAC breakfast  . metoprolol succinate  25 mg Oral Daily  . multivitamin with minerals  1 tablet Oral Daily  . pantoprazole  40 mg Oral Daily  . PARoxetine  40 mg Oral Daily  . potassium chloride  10 mEq Oral Daily  . predniSONE  50 mg Oral Q breakfast  . Rivaroxaban  15 mg Oral QPM  . sodium chloride flush  3 mL Intravenous Q12H  . thiamine  100 mg Oral Daily   Or  . thiamine  100 mg Intravenous Daily   Continuous Infusions:   LOS: 4 days     Jacquelin Hawking, MD  [facility] 11/13/2017, 12:31 PM Pager: [phone]  If 7PM-7AM, please contact night-coverage [email or web address] Password [email or web address] 11/13/2017, 12:31 PM

## 2017-11-14 LAB — CULTURE, BLOOD (ROUTINE X 2)
Culture: NO GROWTH
Special Requests: ADEQUATE

## 2017-11-14 MED ORDER — LEVALBUTEROL TARTRATE 45 MCG/ACT IN AERO: 2.0000 | INHALATION_SPRAY | Freq: Three times a day (TID) | RESPIRATORY_TRACT | 0 refills | Status: DC | PRN

## 2017-11-14 MED ORDER — LEVALBUTEROL HCL 0.63 MG/3ML IN NEBU: 0.6300 mg | INHALATION_SOLUTION | Freq: Three times a day (TID) | RESPIRATORY_TRACT | 0 refills | Status: DC | PRN

## 2017-11-14 NOTE — Care Management Note (Addendum)
Case Management Note  Patient Details  Name: Christina Ortiz MRN: [id number] Date of Birth: [birth year]  Subjective/Objective:                    Action/Plan:  See oxygen saturation note on room air. Patient does not qualify home home oxygen . Expected Discharge Date:                  Expected Discharge Plan:  Home w Home Health Services  In-House Referral:     Discharge planning Services  CM Consult  Post Acute Care Choice:  Home Health, Durable Medical Equipment Choice offered to:  Patient  DME Arranged: NEB machine  DME Agency:     HH Arranged:  PT, RN HH Agency:  [facility]  Status of Service:  Completed, signed off  If discussed at Long Length of Stay Meetings, dates discussed:    Additional Comments:  Kingsley Plan, RN 11/14/2017, 11:36 AM

## 2017-11-14 NOTE — Care Management Important Message (Signed)
Important Message  Patient Details  Name: Christina Ortiz MRN: [id number] Date of Birth: 01-Apr-1929   Medicare Important Message Given:  Yes    Violett Hobbs 11/14/2017, 12:57 PM

## 2017-11-14 NOTE — Progress Notes (Signed)
Christina Ortiz discharged per MD order. Discussed with the patient and all questions fully answered.  VSS, Skin clean, dry and intact without evidence of skin break down, no evidence of skin tears noted.  IV catheter discontinued intact. Site without signs and symptoms of complications. Dressing and pressure applied.  An After Visit Summary was printed and given to the patient. Patient received prescription.  Discharge education completed with patient/family including follow up instructions, medication list, d/c activities limitations if indicated, with other d/c instructions as indicated by MD - patient able to verbalize understanding, all questions fully answered.   Patient instructed to return to ED, call 911, or call MD for any changes in condition.   Patient to be escorted via WC, and D/C home via private auto.

## 2017-11-14 NOTE — Discharge Summary (Addendum)
Physician Discharge Summary  Asher Babilonia ZOX:[id number] DOB: [birth year] DOA: 11/09/2017  PCP: Julaine Fusi, NP  Admit date: 11/09/2017 Discharge date: 11/14/2017  Admitted From: Home Disposition: Home  Recommendations for Outpatient Follow-up:  1. Follow up with PCP in 1 week 2. Please obtain BMP/CBC in one week 3. Recommend pulmonary function testing once at baseline 4. Please follow up on the following pending results: None  Home Health: PT Equipment/Devices: Nebulizer, oxygen  Discharge Condition: Stable CODE STATUS: Full code Diet recommendation: Heart healthy   Brief/Interim Summary:  Admission HPI written by Junious Silk, NP and Erick Blinks, DO   Chief Complaint: Shortness of breath and cough  HPI: Teffany Blaszczyk is a 83 y.o. female with medical history significant for CAD with prior stents to the RCA and LAD, PAF on Xarelto, hypothyroidism, hypertension, mild chronic kidney disease, former tobacco abuse with likely undiagnosed COPD.  Patient reports that 2 weeks ago she went to visit her brother in [place].  While there she resumed smoking (previously had quit 3 years ago) and began drinking alcoholic beverages at a much higher rate than she typically does.  She had reported to her PCP at least 5 vodka-containing drinks a day for the week she was up there.  While she was in [place] she began feeling weak and nauseated and had no appetite and developed some mild upper respiratory symptoms.  Symptoms worsened and she developed shortness of breath over the past 48 hours.  She was seen by her PCP yesterday on 11/13 and was reporting productive yellow sputum and was started on Zithromax.  Because of ongoing dyspnea she presented to the ER today.  ED Course:  Vital Signs: BP 112/70   Pulse (!) 37   Temp 97.6 F (36.4 C) (Oral)   Resp (!) 28   Ht 5\' 3"  (1.6 m)   Wt 69.9 kg (154 lb)   SpO2 94%   BMI 27.28 kg/m  2 view CXR: Chronic bronchitic changes without  acute airspace disease Lab data: Sodium 140, potassium 3.3, chloride 100, CO2 30, glucose 147, BUN 11, creatinine 0.97, BNP 392, point-of-care troponin 0 0.01, white count 6500 differential not obtained, hemoglobin 12.7, MCV 102.9, platelets 157,000 Medications and treatments: Normal saline bolus times 1 L, DuoNeb x1, Zofran 4 mg IV x1, Rocephin 1 g IV x1, Decadron 8 mg IV x1    Hospital course:  Lower respiratory infection secondary to RSV Acute respiratory failure with hypoxia Viral panel significant for RSV infection.  Patient with upper respiratory wheezing.  It is possible she has an underlying lung disease with history of smoking. Oxygen requirement increased to 4L and she was able to be weaned to room air at rest. On ambulation, she remained 88%, up to 93 on 2 L oxygen and qualified for oxygen prior to discharge. Pulmonology consulted during admission and recommended outpatient PFT. Steroids given as a burst and discontinued prior to discharge. Xopenex on discharge in addition to nebulizer machine.  COPD Chronic bronchitic changes seen on chest x-ray. This complicated patient's course with regard to oxygen requirement. Recommend formal PFT as an outpatient as mentioned above. Bronchodilators as mentioned above.   Hypothyroidism Continued Synthroid  CKD stage II Stable.  Essential hypertension Stable. Continued metoprolol and amlodipine  Paroxysmal atrial fibrillation Rate controlled. Continued metoprolol and Xarelto.  Depression Continued Paxil  Alcoholic binge drinking Secondary to recent loss of husband per chart review. CIWA scores of zero.  Macrocytosis Mild.  Likely secondary to alcohol use.  Vitamin B12 and folate within normal limits. Outpatient follow-up    Discharge Diagnoses:  Principal Problem:   Acute bronchitis due to respiratory syncytial virus (RSV) Active Problems:   Acute respiratory failure with hypoxia (HCC)   Hypertension   PAF (paroxysmal  atrial fibrillation) (HCC)   Coronary artery disease   CKD (chronic kidney disease) stage 2, GFR 60-89 ml/min   Hypothyroidism    Discharge Instructions   Allergies as of 11/14/2017      Reactions   Tape Other (See Comments)   Patient's skin is thin and will TEAR AND BRUISE EASILY      Medication List    STOP taking these medications   azithromycin 250 MG tablet Commonly known as:  ZITHROMAX     TAKE these medications   amLODipine 10 MG tablet Commonly known as:  NORVASC Take 1 tablet (10 mg total) by mouth daily.   benzonatate 100 MG capsule Commonly known as:  TESSALON Take 1 capsule (100 mg total) 2 (two) times daily as needed by mouth for cough.   [employer] LUBRICATING/DRY EYE 0.5-0.9 % ophthalmic solution Generic drug:  carboxymethylcellul-glycerin Place 2 drops as needed into both eyes for dry eyes.   doxazosin 8 MG tablet Commonly known as:  CARDURA Take 1 tablet (8 mg total) by mouth daily.   fluticasone 50 MCG/ACT nasal spray Commonly known as:  FLONASE Place 2 sprays daily into both nostrils.   levalbuterol 0.63 MG/3ML nebulizer solution Commonly known as:  XOPENEX Take 3 mLs (0.63 mg total) every 8 (eight) hours as needed by nebulization for wheezing or shortness of breath.   levalbuterol 45 MCG/ACT inhaler Commonly known as:  XOPENEX HFA Inhale 2 puffs every 8 (eight) hours as needed into the lungs for wheezing or shortness of breath.   levothyroxine 75 MCG tablet Commonly known as:  SYNTHROID, LEVOTHROID Take 1 tablet (75 mcg total) daily before breakfast by mouth.   metoprolol succinate 25 MG 24 hr tablet Commonly known as:  TOPROL-XL Take 1 tablet (25 mg total) by mouth daily.   omeprazole 20 MG capsule Commonly known as:  PRILOSEC Take 1 capsule (20 mg total) by mouth daily as needed (heartburn).   PARoxetine 40 MG tablet Commonly known as:  PAXIL Take 1 tablet (40 mg total) by mouth daily.   potassium chloride 10 MEQ tablet Commonly  known as:  K-DUR Take 1 tablet (10 mEq total) by mouth daily.   quinapril 40 MG tablet Commonly known as:  ACCUPRIL Take 1 tablet (40 mg total) by mouth daily.   simvastatin 5 MG tablet Commonly known as:  ZOCOR Take 1 tablet (5 mg total) by mouth every evening.   XARELTO 15 MG Tabs tablet Generic drug:  Rivaroxaban TAKE 1 TABLET (15 MG TOTAL) BY MOUTH DAILY WITH SUPPER. What changed:  Another medication with the same name was removed. Continue taking this medication, and follow the directions you see here.            Durable Medical Equipment  (From admission, onward)        Start     Ordered   11/14/17 1102  For home use only DME Nebulizer machine  Once    Question:  Patient needs a nebulizer to treat with the following condition  Answer:  Acute respiratory failure (HCC)   11/14/17 1101     Follow-up Information    Danford, Jinny Blossom, NP. Schedule an appointment as soon as possible for a visit in 1 week(s).  Specialty:  Family Medicine Why:  Wean off oxygen. Pulmonary function testing. Contact information: [address] [place] [address] [phone]          Allergies  Allergen Reactions  . Tape Other (See Comments)    Patient's skin is thin and will TEAR AND BRUISE EASILY    Consultations:  Pulmonology   Procedures/Studies: Dg Chest 2 View  Result Date: 11/09/2017 CLINICAL DATA:  Cough and wheezing for 1 week.  History smoking. EXAM: CHEST  2 VIEW COMPARISON:  11/08/2017. FINDINGS: The cardiac silhouette remains prominently enlarged. There is also evidence of a sizable hiatal hernia. Aortic atherosclerosis is noted. There is mild chronic peribronchial thickening. No confluent airspace opacity, overt edema, or pneumothorax is identified. No acute osseous abnormality is identified. IMPRESSION: 1. Chronic bronchitic changes without evidence of acute airspace disease. 2. Cardiomegaly and hiatal hernia. Electronically Signed   By: Sebastian AcheAllen  Grady M.D.    On: 11/09/2017 15:24   Dg Chest 2 View  Result Date: 11/09/2017 CLINICAL DATA:  Upper respiratory tract infection, dyspnea, history of coronary artery disease and chronic renal insufficiency, former smoker. EXAM: CHEST  2 VIEW COMPARISON:  PA and lateral chest x-ray of January 22, 2017 FINDINGS: The lungs are well-expanded with hemidiaphragm flattening. The interstitial markings are increased bilaterally. The cardiac silhouette is enlarged and stable. The left lateral costophrenic angle is blunted as is the right but this is stable. The pulmonary vascularity is not clearly engorged. There is calcification in the wall of the aortic arch. The observed bony thorax exhibits no acute abnormality. IMPRESSION: Chronic bronchitic changes. Increased interstitial markings bilaterally may reflect a pneumonitis type process or less likely pulmonary edema given the lack of significant pulmonary vascular congestion. Stable cardiomegaly. Thoracic aortic atherosclerosis. Electronically Signed   By: David  Swazil[place]Jordan M.D.   On: 11/09/2017 07:46     Subjective: No wheezing or dyspnea. Mild cough.  Discharge Exam: Vitals:   11/13/17 2127 11/14/17 0530  BP: 130/83 (!) 149/93  Pulse: 75 79  Resp: (!) 21 20  Temp: 97.6 F (36.4 C) 97.7 F (36.5 C)  SpO2: 93% 92%   Vitals:   11/13/17 0842 11/13/17 1521 11/13/17 2127 11/14/17 0530  BP:  119/75 130/83 (!) 149/93  Pulse:  74 75 79  Resp:  20 (!) 21 20  Temp:  (!) 97.4 F (36.3 C) 97.6 F (36.4 C) 97.7 F (36.5 C)  TempSrc:  Oral Oral Oral  SpO2: 94% 91% 93% 92%  Weight:      Height:        General: Pt is alert, awake, not in acute distress Cardiovascular: RRR, S1/S2 +, no rubs, no gallops Respiratory: CTA bilaterally, no wheezing, no rhonchi Abdominal: Soft, NT, ND, bowel sounds + Extremities: no edema, no cyanosis    The results of significant diagnostics from this hospitalization (including imaging, microbiology, ancillary and laboratory) are  listed below for reference.     Microbiology: Recent Results (from the past 240 hour(s))  Culture, blood (Routine X 2) w Reflex to ID Panel     Status: Abnormal   Collection Time: 11/09/17  4:30 PM  Result Value Ref Range Status   Specimen Description BLOOD LEFT WRIST  Final   Special Requests   Final    BOTTLES DRAWN AEROBIC AND ANAEROBIC Blood Culture adequate volume   Culture  Setup Time   Final    GRAM POSITIVE COCCI IN CLUSTERS ANAEROBIC BOTTLE ONLY CRITICAL RESULT CALLED TO, READ BACK BY AND VERIFIED  WITH: L. Foltanski Pharm.D. 11:55 11/11/17 ([email or web address])    Culture (A)  Final    STAPHYLOCOCCUS SPECIES (COAGULASE NEGATIVE) THE SIGNIFICANCE OF ISOLATING THIS ORGANISM FROM A SINGLE SET OF BLOOD CULTURES WHEN MULTIPLE SETS ARE DRAWN IS UNCERTAIN. PLEASE NOTIFY THE MICROBIOLOGY DEPARTMENT WITHIN ONE WEEK IF SPECIATION AND SENSITIVITIES ARE REQUIRED.    Report Status 11/13/2017 FINAL  Final  Blood Culture ID Panel (Reflexed)     Status: Abnormal   Collection Time: 11/09/17  4:30 PM  Result Value Ref Range Status   Enterococcus species NOT DETECTED NOT DETECTED Final   Listeria monocytogenes NOT DETECTED NOT DETECTED Final   Staphylococcus species DETECTED (A) NOT DETECTED Final    Comment: Methicillin (oxacillin) resistant coagulase negative staphylococcus. Possible blood culture contaminant (unless isolated from more than one blood culture draw or clinical case suggests pathogenicity). No antibiotic treatment is indicated for blood  culture contaminants. CRITICAL RESULT CALLED TO, READ BACK BY AND VERIFIED WITH: L. Foltanski Pharm.D. 11:55 11/11/17 ([email or web address])    Staphylococcus aureus NOT DETECTED NOT DETECTED Final   Methicillin resistance DETECTED (A) NOT DETECTED Final    Comment: CRITICAL RESULT CALLED TO, READ BACK BY AND VERIFIED WITH: L. Foltanski Pharm.D. 11:55 11/11/17 (wilsonm)    Streptococcus species NOT DETECTED NOT DETECTED Final   Streptococcus agalactiae NOT  DETECTED NOT DETECTED Final   Streptococcus pneumoniae NOT DETECTED NOT DETECTED Final   Streptococcus pyogenes NOT DETECTED NOT DETECTED Final   Acinetobacter baumannii NOT DETECTED NOT DETECTED Final   Enterobacteriaceae species NOT DETECTED NOT DETECTED Final   Enterobacter cloacae complex NOT DETECTED NOT DETECTED Final   Escherichia coli NOT DETECTED NOT DETECTED Final   Klebsiella oxytoca NOT DETECTED NOT DETECTED Final   Klebsiella pneumoniae NOT DETECTED NOT DETECTED Final   Proteus species NOT DETECTED NOT DETECTED Final   Serratia marcescens NOT DETECTED NOT DETECTED Final   Haemophilus influenzae NOT DETECTED NOT DETECTED Final   Neisseria meningitidis NOT DETECTED NOT DETECTED Final   Pseudomonas aeruginosa NOT DETECTED NOT DETECTED Final   Candida albicans NOT DETECTED NOT DETECTED Final   Candida glabrata NOT DETECTED NOT DETECTED Final   Candida krusei NOT DETECTED NOT DETECTED Final   Candida parapsilosis NOT DETECTED NOT DETECTED Final   Candida tropicalis NOT DETECTED NOT DETECTED Final  Culture, blood (Routine X 2) w Reflex to ID Panel     Status: None   Collection Time: 11/09/17  4:36 PM  Result Value Ref Range Status   Specimen Description BLOOD RIGHT HAND  Final   Special Requests   Final    BOTTLES DRAWN AEROBIC AND ANAEROBIC Blood Culture adequate volume   Culture NO GROWTH 5 DAYS  Final   Report Status 11/14/2017 FINAL  Final  Respiratory Panel by PCR     Status: Abnormal   Collection Time: 11/09/17  4:55 PM  Result Value Ref Range Status   Adenovirus NOT DETECTED NOT DETECTED Final   Coronavirus 229E NOT DETECTED NOT DETECTED Final   Coronavirus HKU1 NOT DETECTED NOT DETECTED Final   Coronavirus NL63 NOT DETECTED NOT DETECTED Final   Coronavirus OC43 NOT DETECTED NOT DETECTED Final   Metapneumovirus NOT DETECTED NOT DETECTED Final   Rhinovirus / Enterovirus NOT DETECTED NOT DETECTED Final   Influenza A NOT DETECTED NOT DETECTED Final   Influenza B NOT  DETECTED NOT DETECTED Final   Parainfluenza Virus 1 NOT DETECTED NOT DETECTED Final   Parainfluenza Virus 2 NOT DETECTED NOT DETECTED Final   Parainfluenza Virus 3  NOT DETECTED NOT DETECTED Final   Parainfluenza Virus 4 NOT DETECTED NOT DETECTED Final   Respiratory Syncytial Virus DETECTED (A) NOT DETECTED Final    Comment: CRITICAL RESULT CALLED TO, READ BACK BY AND VERIFIED WITH: RN J MCBRIDE [phone] MLM    Bordetella pertussis NOT DETECTED NOT DETECTED Final   Chlamydophila pneumoniae NOT DETECTED NOT DETECTED Final   Mycoplasma pneumoniae NOT DETECTED NOT DETECTED Final     Labs: BNP (last 3 results) Recent Labs    11/09/17 1113  BNP 392.7*   Basic Metabolic Panel: Recent Labs  Lab 11/09/17 1113 11/10/17 0539  NA 140 141  K 3.3* 4.3  CL 100* 106  CO2 30 27  GLUCOSE 147* 147*  BUN 11 19  CREATININE 0.97 0.96  CALCIUM 9.1 8.3*   Liver Function Tests: Recent Labs  Lab 11/10/17 0539  AST 22  ALT 9*  ALKPHOS 63  BILITOT 0.7  PROT 6.4*  ALBUMIN 3.1*   No results for input(s): LIPASE, AMYLASE in the last 168 hours. No results for input(s): AMMONIA in the last 168 hours. CBC: Recent Labs  Lab 11/09/17 1113 11/09/17 1636 11/10/17 0539  WBC 6.5  --  5.9  HGB 12.7  --  11.3*  HCT 39.3 35.2 35.7*  MCV 102.9*  --  103.2*  PLT 157  --  131*   Cardiac Enzymes: No results for input(s): CKTOTAL, CKMB, CKMBINDEX, TROPONINI in the last 168 hours. BNP: Invalid input(s): POCBNP CBG: No results for input(s): GLUCAP in the last 168 hours. D-Dimer No results for input(s): DDIMER in the last 72 hours. Hgb A1c No results for input(s): HGBA1C in the last 72 hours. Lipid Profile No results for input(s): CHOL, HDL, LDLCALC, TRIG, CHOLHDL, LDLDIRECT in the last 72 hours. Thyroid function studies No results for input(s): TSH, T4TOTAL, T3FREE, THYROIDAB in the last 72 hours.  Invalid input(s): FREET3 Anemia work up No results for input(s): VITAMINB12, FOLATE,  FERRITIN, TIBC, IRON, RETICCTPCT in the last 72 hours. Urinalysis    Component Value Date/Time   COLORURINE YELLOW 05/23/2017 0125   APPEARANCEUR HAZY (A) 05/23/2017 0125   LABSPEC 1.006 05/23/2017 0125   PHURINE 7.0 05/23/2017 0125   GLUCOSEU NEGATIVE 05/23/2017 0125   HGBUR SMALL (A) 05/23/2017 0125   BILIRUBINUR NEGATIVE 05/23/2017 0125   KETONESUR NEGATIVE 05/23/2017 0125   PROTEINUR 30 (A) 05/23/2017 0125   UROBILINOGEN 1.0 04/12/2017 1157   NITRITE NEGATIVE 05/23/2017 0125   LEUKOCYTESUR LARGE (A) 05/23/2017 0125   Sepsis Labs Invalid input(s): PROCALCITONIN,  WBC,  LACTICIDVEN Microbiology Recent Results (from the past 240 hour(s))  Culture, blood (Routine X 2) w Reflex to ID Panel     Status: Abnormal   Collection Time: 11/09/17  4:30 PM  Result Value Ref Range Status   Specimen Description BLOOD LEFT WRIST  Final   Special Requests   Final    BOTTLES DRAWN AEROBIC AND ANAEROBIC Blood Culture adequate volume   Culture  Setup Time   Final    GRAM POSITIVE COCCI IN CLUSTERS ANAEROBIC BOTTLE ONLY CRITICAL RESULT CALLED TO, READ BACK BY AND VERIFIED WITH: L. Foltanski Pharm.D. 11:55 11/11/17 ([email or web address])    Culture (A)  Final    STAPHYLOCOCCUS SPECIES (COAGULASE NEGATIVE) THE SIGNIFICANCE OF ISOLATING THIS ORGANISM FROM A SINGLE SET OF BLOOD CULTURES WHEN MULTIPLE SETS ARE DRAWN IS UNCERTAIN. PLEASE NOTIFY THE MICROBIOLOGY DEPARTMENT WITHIN ONE WEEK IF SPECIATION AND SENSITIVITIES ARE REQUIRED.    Report Status 11/13/2017 FINAL  Final  Blood  Culture ID Panel (Reflexed)     Status: Abnormal   Collection Time: 11/09/17  4:30 PM  Result Value Ref Range Status   Enterococcus species NOT DETECTED NOT DETECTED Final   Listeria monocytogenes NOT DETECTED NOT DETECTED Final   Staphylococcus species DETECTED (A) NOT DETECTED Final    Comment: Methicillin (oxacillin) resistant coagulase negative staphylococcus. Possible blood culture contaminant (unless isolated from more than one  blood culture draw or clinical case suggests pathogenicity). No antibiotic treatment is indicated for blood  culture contaminants. CRITICAL RESULT CALLED TO, READ BACK BY AND VERIFIED WITH: L. Foltanski Pharm.D. 11:55 11/11/17 ([email or web address])    Staphylococcus aureus NOT DETECTED NOT DETECTED Final   Methicillin resistance DETECTED (A) NOT DETECTED Final    Comment: CRITICAL RESULT CALLED TO, READ BACK BY AND VERIFIED WITH: L. Foltanski Pharm.D. 11:55 11/11/17 ([email or web address])    Streptococcus species NOT DETECTED NOT DETECTED Final   Streptococcus agalactiae NOT DETECTED NOT DETECTED Final   Streptococcus pneumoniae NOT DETECTED NOT DETECTED Final   Streptococcus pyogenes NOT DETECTED NOT DETECTED Final   Acinetobacter baumannii NOT DETECTED NOT DETECTED Final   Enterobacteriaceae species NOT DETECTED NOT DETECTED Final   Enterobacter cloacae complex NOT DETECTED NOT DETECTED Final   Escherichia coli NOT DETECTED NOT DETECTED Final   Klebsiella oxytoca NOT DETECTED NOT DETECTED Final   Klebsiella pneumoniae NOT DETECTED NOT DETECTED Final   Proteus species NOT DETECTED NOT DETECTED Final   Serratia marcescens NOT DETECTED NOT DETECTED Final   Haemophilus influenzae NOT DETECTED NOT DETECTED Final   Neisseria meningitidis NOT DETECTED NOT DETECTED Final   Pseudomonas aeruginosa NOT DETECTED NOT DETECTED Final   Candida albicans NOT DETECTED NOT DETECTED Final   Candida glabrata NOT DETECTED NOT DETECTED Final   Candida krusei NOT DETECTED NOT DETECTED Final   Candida parapsilosis NOT DETECTED NOT DETECTED Final   Candida tropicalis NOT DETECTED NOT DETECTED Final  Culture, blood (Routine X 2) w Reflex to ID Panel     Status: None   Collection Time: 11/09/17  4:36 PM  Result Value Ref Range Status   Specimen Description BLOOD RIGHT HAND  Final   Special Requests   Final    BOTTLES DRAWN AEROBIC AND ANAEROBIC Blood Culture adequate volume   Culture NO GROWTH 5 DAYS  Final   Report Status  11/14/2017 FINAL  Final  Respiratory Panel by PCR     Status: Abnormal   Collection Time: 11/09/17  4:55 PM  Result Value Ref Range Status   Adenovirus NOT DETECTED NOT DETECTED Final   Coronavirus 229E NOT DETECTED NOT DETECTED Final   Coronavirus HKU1 NOT DETECTED NOT DETECTED Final   Coronavirus NL63 NOT DETECTED NOT DETECTED Final   Coronavirus OC43 NOT DETECTED NOT DETECTED Final   Metapneumovirus NOT DETECTED NOT DETECTED Final   Rhinovirus / Enterovirus NOT DETECTED NOT DETECTED Final   Influenza A NOT DETECTED NOT DETECTED Final   Influenza B NOT DETECTED NOT DETECTED Final   Parainfluenza Virus 1 NOT DETECTED NOT DETECTED Final   Parainfluenza Virus 2 NOT DETECTED NOT DETECTED Final   Parainfluenza Virus 3 NOT DETECTED NOT DETECTED Final   Parainfluenza Virus 4 NOT DETECTED NOT DETECTED Final   Respiratory Syncytial Virus DETECTED (A) NOT DETECTED Final    Comment: CRITICAL RESULT CALLED TO, READ BACK BY AND VERIFIED WITH: RN J MCBRIDE [phone] MLM    Bordetella pertussis NOT DETECTED NOT DETECTED Final   Chlamydophila pneumoniae NOT DETECTED NOT DETECTED Final  Mycoplasma pneumoniae NOT DETECTED NOT DETECTED Final     SIGNED:   Jacquelin Hawking, MD [facility] 11/14/2017, 11:42 AM Pager [phone]  If 7PM-7AM, please contact night-coverage [email or web address] Password [email or web address]

## 2017-11-14 NOTE — Progress Notes (Signed)
SATURATION QUALIFICATIONS: (This note is used to comply with regulatory documentation for home oxygen)  Patient Saturations on Room Air at Rest = 93%  Patient Saturations on Room Air while Ambulating = 88%  Patient Saturations on 1-2 Liters of oxygen while Ambulating = 93%  Please briefly explain why patient needs home oxygen:  Patient lives alone; wheezing  At request of patients daughter saturation was rechecked from 11:29am entry

## 2017-11-14 NOTE — Care Management (Signed)
Ordered home oxygen through [facility] with [facility] .  Ronny Flurry RN BSN [phone]

## 2017-11-14 NOTE — Discharge Instructions (Addendum)
Respiratory Syncytial Virus, Adult Respiratory syncytial virus (RSV) is a common viral infection. It is caused by a virus that is similar to viruses that cause cold and flu symptoms. RSV can affect the nose, throat, and upper air passages (upper respiratory system) and the windpipe and lungs (lower respiratory system). If RSV affects the air passages in your lungs, you have bronchiolitis. If RSV affects your lungs, you have pneumonia. RSV spreads from person to person (is contagious) through droplets from coughs and sneezes (respiratory secretions). RSV is rarely serious when it occurs in adults. What are the causes? An RSV infection is caused by the respiratory syncytial virus. When a sick person coughs or sneezes, there are particles of the virus in the droplets. You can get sick if you breathe in (inhale) those droplets. The virus can also survive for a while in droplets that land on surfaces. If you touch a contaminated surface and then touch your face, the virus can enter your body through your mouth, nose, or eyes. The virus also spreads through kissing, close contact, and shared eating or drinking utensils. What increases the risk? You may have a higher risk for RSV infection if:  You are 65 or older.  You have a long-term (chronic) lung condition, such as COPD.  You have a weakened disease-fighting system (immune system).  You have Down syndrome.  You have heart disease.  You work in a hospital or other health care facility.  You live in a long-term health care facility.  What are the signs or symptoms? Symptoms of RSV include:  Runny nose.  Coughing. You may have a cough that brings up mucus (productive cough).  Sneezing.  Fever.  Decreased appetite.  Breathing loudly (wheezing).  Shortness of breath.  Fluid buildup in the lungs (respiratory distress).  How is this diagnosed? This condition may be diagnosed based on:  Your symptoms.  Your medical history.  A  physical exam.  A chest X-ray to rule out pneumonia.  Blood tests or tests of mucus from your lungs (sputum). These tests may be done if you are older.  A test of your respiratory secretions.  How is this treated? In most cases, the RSV infection will go away after 1-2 weeks of caring for yourself at home. If you have severe RSV and you develop pneumonia, you may need to be treated in the hospital with oxygen, antibiotic medicine, and medicines to open your breathing tubes (bronchodilators). Follow these instructions at home:  Take over-the-counter and prescription medicines only as told by your health care provider.  Drink enough fluid to keep your urine clear or pale yellow.  Rest at home until your symptoms go away.  Eat a healthy diet.  Do not use any products that contain nicotine or tobacco, such as cigarettes and e-cigarettes. If you need help quitting, ask your health care provider.  Keep all follow-up visits as told by your health care provider. This is important. How is this prevented? To prevent catching and spreading the RSV virus:  Wash your hands often with soap and water. If soap and water are not available, use an alcohol-based hand sanitizer. If you have not cleaned your hands, do not touch your face.  If you have cold-like or flu-like symptoms, stay home.  Cover your nose and mouth when you cough or sneeze.  Avoid large groups of people.  Keep a safe distance from people who are coughing and sneezing.  Contact a health care provider if:  Your symptoms  get worse.  Your symptoms have not improved after 2 weeks.  You have a fever.  You have continuing (persistent) sweatiness, hot flashes, or chills.  You cough up much more mucus than usual.  You cough up blood.  You feel very tired (are lethargic).  You become confused.  You have respiratory distress that gets worse. This information is not intended to replace advice given to you by your health  care provider. Make sure you discuss any questions you have with your health care provider. Document Released: 05/25/2016 Document Revised: 07/02/2016 Document Reviewed: 05/25/2016 Elsevier Interactive Patient Education  2018 [employer]

## 2017-11-14 NOTE — Progress Notes (Addendum)
SATURATION QUALIFICATIONS: (This note is used to comply with regulatory documentation for home oxygen)  Patient Saturations on Room Air at Rest = 98  Patient Saturations on Room Air while Ambulating = 95  Done while ambulating with Mobility Tech, [place].  Please briefly explain why patient needs home oxygen:

## 2017-11-16 ENCOUNTER — Other Ambulatory Visit: Payer: Self-pay

## 2017-11-16 MED ORDER — QUINAPRIL HCL 40 MG PO TABS: 40.0000 mg | ORAL_TABLET | Freq: Every day | ORAL | 0 refills | Status: DC

## 2017-11-16 MED ORDER — DOXAZOSIN MESYLATE 8 MG PO TABS: 8.0000 mg | ORAL_TABLET | Freq: Every day | ORAL | 0 refills | Status: DC

## 2017-11-16 MED ORDER — OMEPRAZOLE 20 MG PO CPDR: 20.0000 mg | DELAYED_RELEASE_CAPSULE | Freq: Every day | ORAL | 0 refills | Status: DC | PRN

## 2017-11-21 NOTE — Progress Notes (Signed)
Subjective:    Patient ID: Christina Ortiz, female    DOB: [birth year], 83 y.o.   MRN: [id number]  HPI:  11/13/18OV: Christina Ortiz is here for nasal drainage (white/thick),constant productive cough (yellow mucus), fatigue, and mild nausea without vomiting for >1.5 weeks and it is worsening.  She was in [place] visiting family last week for 7 days.  She reports consuming >5 vodka drinks a day for over a week.  She denies CP/dyspnea at rest/palpitations.  She also reports tobacco use while in [place].  Her daughter (who is at [facility]) picked her up from the airport yesterday and noticed her not feeling well.    11/22/17 OV: [name] is here for f/u for hospitalization 1114/18- 11/14/17  Notes from Discharge: "Lower respiratory infection secondary to RSV Acute respiratory failure with hypoxia Viral panel significant for RSV infection. Patient with upper respiratory wheezing. It is possible she has an underlying lung disease with history of smoking. Oxygen requirement increased to 4L and she was able to be weaned to room air at rest. On ambulation, she remained 88%, up to 93 on 2 L oxygen and qualified for oxygen prior to discharge. Pulmonology consulted during admission and recommended outpatient PFT. Steroids given as a burst and discontinued prior to discharge. Xopenex on discharge in addition to nebulizer machine. COPD Chronic bronchitic changes seen on chest x-ray. This complicated patient's course with regard to oxygen requirement. Recommend formal PFT as an outpatient as mentioned above. Bronchodilators as mentioned above.  Hypothyroidism Continued Synthroid CKD stage II Stable. Essential hypertension Stable. Continued metoprolol and amlodipine Paroxysmal atrial fibrillation Rate controlled. Continued metoprolol and Xarelto. Depression Continued Paxil Alcoholic binge drinking Secondary to recent loss of husband per chart review. CIWA scores of  zero. Macrocytosis Mild. Likely secondary to alcohol use. Vitamin B12 and folate within normal limits. Outpatient follow-up"  She has been using home O2- 1L only when sleeping. Home pulse oximeter her readings >92% on RA. She denies CP/dyspnea at rest/palpitations.  She has a non-productive cough, denies fever/night sweats and reports that her appetite is normal.  She reports several loose stools since returning home, none in the last 2 days.  She denies blood in urine/stool or abdominal pain. Home Health intake RN visited on 11/19/17 and next home visit with RN is scheduled for 11/24/17.  Home PT scheduled for 11/24/17. Daughter at [facility] during OV  Patient Care Team    Relationship Specialty Notifications Start End  William Hamburger D, NP PCP - General Family Medicine  03/30/17   Jake Bathe, MD Consulting Physician Cardiology  03/30/17   Mateo Flow, MD Consulting Physician Ophthalmology  03/30/17   [facility]    03/30/17     Patient Active Problem List   Diagnosis Date Noted  . Macrocytosis 11/22/2017  . Acute bronchitis due to respiratory syncytial virus (RSV) 11/09/2017  . Acute respiratory failure with hypoxia (HCC) 11/09/2017  . Hypertension 11/09/2017  . PAF (paroxysmal atrial fibrillation) (HCC) 11/09/2017  . Coronary artery disease 11/09/2017  . CKD (chronic kidney disease) stage 2, GFR 60-89 ml/min 11/09/2017  . Hypothyroidism 11/09/2017  . Dyspnea 11/08/2017  . Upper respiratory tract infection 11/08/2017  . Elevated serum creatinine 06/21/2017  . Edema of both ankles 06/21/2017  . Diarrhea 05/25/2017  . Hypokalemia 05/25/2017  . Alcohol abuse 05/25/2017  . Depression, recurrent (HCC) 03/30/2017  . Lumbago 03/30/2017  . Influenza with respiratory manifestation 01/22/2017  . Coronary artery disease due to lipid rich plaque 04/04/2015  .  Essential hypertension 04/04/2015  . Hyperlipidemia 04/04/2015  . Former smoker 04/04/2015  . Chronic  anticoagulation 04/04/2015     Past Medical History:  Diagnosis Date  . Acute bronchitis 10/2017  . Arthritis   . CKD (chronic kidney disease)    Xarelto dose is 15 mg QD  . Coronary artery disease    a. s/p PCI/stenting to RCA and LAD ([facility] in [place]) in 2006; b. Nuc 1/15: No ischemia, EF 70%  . Depression   . Gastritis   . History of cholecystectomy   . History of echocardiogram    a. Echo 1/15: Moderate LVH, EF 55-60%, impaired relaxation, mild AS  . Hyperlipidemia   . Hypertension   . Hypokalemia   . Mobitz type 1 second degree atrioventricular block    a. Event Monitor 3/15: NSR, first-degree AV block, second-degree AB block - Mobitz 1, PACs, NSVT (5 beats)  . PAF (paroxysmal atrial fibrillation) (HCC)   . Thyroid disease      Past Surgical History:  Procedure Laterality Date  . BREAST LUMPECTOMY Left   . CARDIAC CATHETERIZATION    . CATARACT EXTRACTION Bilateral   . CHOLECYSTECTOMY    . DILATION AND CURETTAGE OF UTERUS    . EYE SURGERY       Family History  Problem Relation Age of Onset  . Heart disease Mother   . Heart attack Mother   . Lung cancer Father   . Cancer Father        lung     Social History   Substance and Sexual Activity  Drug Use No     Social History   Substance and Sexual Activity  Alcohol Use Yes  . Alcohol/week: 6.0 oz  . Types: 10 Shots of liquor per week     Social History   Tobacco Use  Smoking Status Former Smoker  . Packs/day: 0.50  . Years: 67.00  . Pack years: 33.50  . Types: Cigarettes  . Last attempt to quit: 10/25/2015  . Years since quitting: 2.0  Smokeless Tobacco Never Used  Tobacco Comment   quit 2015     Outpatient Encounter Medications as of 11/22/2017  Medication Sig  . amLODipine (NORVASC) 10 MG tablet Take 1 tablet (10 mg total) by mouth daily.  . benzonatate (TESSALON) 100 MG capsule Take 1 capsule (100 mg total) 2 (two) times daily as needed by mouth for cough.  .  carboxymethylcellul-glycerin ([employer] LUBRICATING/DRY EYE) 0.5-0.9 % ophthalmic solution Place 2 drops as needed into both eyes for dry eyes.  Marland Kitchen doxazosin (CARDURA) 8 MG tablet Take 1 tablet (8 mg total) by mouth daily.  . fluticasone (FLONASE) 50 MCG/ACT nasal spray Place 2 sprays daily into both nostrils.  Marland Kitchen levalbuterol (XOPENEX HFA) 45 MCG/ACT inhaler Inhale 2 puffs into the lungs every 8 (eight) hours as needed for wheezing or shortness of breath.  . levalbuterol (XOPENEX) 0.63 MG/3ML nebulizer solution Take 3 mLs (0.63 mg total) by nebulization every 8 (eight) hours as needed for wheezing or shortness of breath.  . levothyroxine (SYNTHROID, LEVOTHROID) 75 MCG tablet Take 1 tablet (75 mcg total) daily before breakfast by mouth.  . metoprolol succinate (TOPROL-XL) 25 MG 24 hr tablet Take 1 tablet (25 mg total) by mouth daily.  Marland Kitchen omeprazole (PRILOSEC) 20 MG capsule Take 1 capsule (20 mg total) by mouth daily as needed (heartburn).  Marland Kitchen PARoxetine (PAXIL) 40 MG tablet Take 1 tablet (40 mg total) by mouth daily.  . potassium chloride (K-DUR)  10 MEQ tablet Take 1 tablet (10 mEq total) by mouth daily.  . quinapril (ACCUPRIL) 40 MG tablet Take 1 tablet (40 mg total) by mouth daily.  . simvastatin (ZOCOR) 5 MG tablet Take 1 tablet (5 mg total) by mouth every evening.  [name]. XARELTO 15 MG TABS tablet TAKE 1 TABLET (15 MG TOTAL) BY MOUTH DAILY WITH SUPPER.  . [DISCONTINUED] levalbuterol (XOPENEX HFA) 45 MCG/ACT inhaler Inhale 2 puffs every 8 (eight) hours as needed into the lungs for wheezing or shortness of breath.  . [DISCONTINUED] levalbuterol (XOPENEX) 0.63 MG/3ML nebulizer solution Take 3 mLs (0.63 mg total) every 8 (eight) hours as needed by nebulization for wheezing or shortness of breath.   No facility-administered encounter medications on file as of 11/22/2017.     Allergies: Tape  Body mass index is 27.07 kg/m.  Blood pressure 93/61, pulse 80, height 5\' 3"  (1.6 m), weight 152 lb 12.8 oz (69.3  kg), SpO2 94 %.   Review of Systems  Constitutional: Positive for activity change, appetite change, chills, fever and unexpected weight change. Negative for diaphoresis.  HENT: Positive for congestion.   Eyes: Negative for visual disturbance.  Respiratory: Positive for cough and shortness of breath. Negative for chest tightness, wheezing and stridor.   Cardiovascular: Negative for chest pain, palpitations and leg swelling.  Gastrointestinal: Positive for nausea. Negative for abdominal distention, abdominal pain, anal bleeding, blood in stool, constipation and diarrhea.  Endocrine: Positive for cold intolerance. Negative for heat intolerance, polydipsia, polyphagia and polyuria.  Genitourinary: Negative for difficulty urinating, flank pain and hematuria.  Neurological: Negative for dizziness and headaches.  Hematological: Bruises/bleeds easily.  Psychiatric/Behavioral: Positive for sleep disturbance.       Objective:   Physical Exam  Constitutional: She is oriented to person, place, and time. She appears well-developed and well-nourished. No distress.  HENT:  Head: Normocephalic and atraumatic.  Right Ear: External ear and ear canal normal. Tympanic membrane is not erythematous and not bulging. Decreased hearing is noted.  Left Ear: External ear and ear canal normal. Tympanic membrane is not erythematous and not bulging. Decreased hearing is noted.  Nose: Mucosal edema and rhinorrhea present. Right sinus exhibits no maxillary sinus tenderness and no frontal sinus tenderness. Left sinus exhibits no maxillary sinus tenderness and no frontal sinus tenderness.  Mouth/Throat: Uvula is midline, oropharynx is clear and moist and mucous membranes are normal. Abnormal dentition.  Eyes: Conjunctivae are normal. Pupils are equal, round, and reactive to light.  Neck: Normal range of motion. Neck supple.  Cardiovascular: Normal rate, regular rhythm and intact distal pulses.  Murmur  heard. Pulmonary/Chest: Effort normal. No respiratory distress. She has decreased breath sounds in the right lower field and the left lower field. She has no wheezes. She has no rhonchi. She has no rales. She exhibits no tenderness.  Lymphadenopathy:    She has no cervical adenopathy.  Neurological: She is alert and oriented to person, place, and time.  Skin: Skin is warm and dry. No rash noted. She is not diaphoretic. No erythema. No pallor.  Psychiatric: She has a normal mood and affect. Her behavior is normal. Judgment and thought content normal.          Assessment & Plan:   1. Hyperlipidemia, unspecified hyperlipidemia type   2. Hypothyroidism, unspecified type   3. Alcohol abuse   4. Essential hypertension   5. Hypokalemia   6. Shortness of breath   7. Elevated serum creatinine   8. Macrocytosis  Alcohol abuse Currently abstaining from ETOH Advised strongly to completely avoid ANY ETOH  Hypokalemia BMP drawn today  Hyperlipidemia Will obtain fasting labs at regular f/u in 2 weeks.  Dyspnea Please continue all medications as directed. Levalbuterol (Xopenex) 41mcg/act inhaler and Levalbuterol (Xopenex) 0.63mg /14ml nebulizer solution sent in, please use per instructions. Continue to use oxygen when napping/sleeping. If you become breathless during the day, please don oxygen. Pulmonology referral placed. O2 sat on RA today- 94%, no resp distress noted  Hypothyroidism Will check TSH at regular f/u in 2 weeks  Essential hypertension BP a little low today SBP 90s, DBP 60s, HR 80s No acute cardiac sx's noted Avoid tobacco and alcohol. Please have home health check your blood pressure on Thursday and send [place] a message with the reading. Please call with any questions/concerns. Please keep your follow-up appt here in 2 weeks.   Macrocytosis CBC drawn today  We discussed AT LENGTH that she needs to abstain from tobacco/ETOH use  FOLLOW-UP:  Return in about 2  weeks (around 12/06/2017) for Regular Follow Up.

## 2017-11-22 ENCOUNTER — Encounter: Payer: Self-pay | Admitting: Adult Health

## 2017-11-22 ENCOUNTER — Ambulatory Visit (INDEPENDENT_AMBULATORY_CARE_PROVIDER_SITE_OTHER): Payer: Medicare PPO | Admitting: Adult Health

## 2017-11-22 ENCOUNTER — Other Ambulatory Visit: Payer: Self-pay

## 2017-11-22 VITALS — BP 93/61 | HR 80 | Ht 63.0 in | Wt 152.8 lb

## 2017-11-22 DIAGNOSIS — E876 Hypokalemia: Secondary | ICD-10-CM

## 2017-11-22 DIAGNOSIS — I10 Essential (primary) hypertension: Secondary | ICD-10-CM

## 2017-11-22 DIAGNOSIS — E785 Hyperlipidemia, unspecified: Secondary | ICD-10-CM | POA: Diagnosis not present

## 2017-11-22 DIAGNOSIS — R0602 Shortness of breath: Secondary | ICD-10-CM

## 2017-11-22 DIAGNOSIS — R7989 Other specified abnormal findings of blood chemistry: Secondary | ICD-10-CM | POA: Diagnosis not present

## 2017-11-22 DIAGNOSIS — F1010 Alcohol abuse, uncomplicated: Secondary | ICD-10-CM | POA: Diagnosis not present

## 2017-11-22 DIAGNOSIS — D7589 Other specified diseases of blood and blood-forming organs: Secondary | ICD-10-CM

## 2017-11-22 DIAGNOSIS — E039 Hypothyroidism, unspecified: Secondary | ICD-10-CM

## 2017-11-22 MED ORDER — LEVALBUTEROL HCL 0.63 MG/3ML IN NEBU: 0.6300 mg | INHALATION_SOLUTION | Freq: Three times a day (TID) | RESPIRATORY_TRACT | 0 refills | Status: DC | PRN

## 2017-11-22 MED ORDER — LEVALBUTEROL TARTRATE 45 MCG/ACT IN AERO: 2.0000 | INHALATION_SPRAY | Freq: Three times a day (TID) | RESPIRATORY_TRACT | 0 refills | Status: DC | PRN

## 2017-11-22 NOTE — Assessment & Plan Note (Signed)
CBC drawn today.

## 2017-11-22 NOTE — Assessment & Plan Note (Signed)
BMP drawn today.

## 2017-11-22 NOTE — Assessment & Plan Note (Signed)
Will check TSH at regular f/u in 2 weeks

## 2017-11-22 NOTE — Assessment & Plan Note (Addendum)
BP a little low today SBP 90s, DBP 60s, HR 80s No acute cardiac sx's noted Avoid tobacco and alcohol. Please have home health check your blood pressure on Thursday and send [place] a message with the reading. Please call with any questions/concerns. Please keep your follow-up appt here in 2 weeks.

## 2017-11-22 NOTE — Assessment & Plan Note (Signed)
Will obtain fasting labs at regular f/u in 2 weeks.

## 2017-11-22 NOTE — Assessment & Plan Note (Signed)
Currently abstaining from ETOH Advised strongly to completely avoid ANY ETOH

## 2017-11-22 NOTE — Patient Instructions (Signed)
Heart-Healthy Eating Plan Many factors influence your heart health, including eating and exercise habits. Heart (coronary) risk increases with abnormal blood fat (lipid) levels. Heart-healthy meal planning includes limiting unhealthy fats, increasing healthy fats, and making other small dietary changes. This includes maintaining a healthy body weight to help keep lipid levels within a normal range. What is my plan? Your health care provider recommends that you:  Get no more than __25__% of the total calories in your daily diet from fat.  Limit your intake of saturated fat to less than ___5__% of your total calories each day.  Limit the amount of cholesterol in your diet to less than __300___ mg per day.  What types of fat should I choose?  Choose healthy fats more often. Choose monounsaturated and polyunsaturated fats, such as olive oil and canola oil, flaxseeds, walnuts, almonds, and seeds.  Eat more omega-3 fats. Good choices include salmon, mackerel, sardines, tuna, flaxseed oil, and ground flaxseeds. Aim to eat fish at least two times each week.  Limit saturated fats. Saturated fats are primarily found in animal products, such as meats, butter, and cream. Plant sources of saturated fats include palm oil, palm kernel oil, and coconut oil.  Avoid foods with partially hydrogenated oils in them. These contain trans fats. Examples of foods that contain trans fats are stick margarine, some tub margarines, cookies, crackers, and other baked goods. What general guidelines do I need to follow?  Check food labels carefully to identify foods with trans fats or high amounts of saturated fat.  Fill one half of your plate with vegetables and green salads. Eat 4-5 servings of vegetables per day. A serving of vegetables equals 1 cup of raw leafy vegetables,  cup of raw or cooked cut-up vegetables, or  cup of vegetable juice.  Fill one fourth of your plate with whole grains. Look for the word "whole"  as the first word in the ingredient list.  Fill one fourth of your plate with lean protein foods.  Eat 4-5 servings of fruit per day. A serving of fruit equals one medium whole fruit,  cup of dried fruit,  cup of fresh, frozen, or canned fruit, or  cup of 100% fruit juice.  Eat more foods that contain soluble fiber. Examples of foods that contain this type of fiber are apples, broccoli, carrots, beans, peas, and barley. Aim to get 20-30 g of fiber per day.  Eat more home-cooked food and less restaurant, buffet, and fast food.  Limit or avoid alcohol.  Limit foods that are high in starch and sugar.  Avoid fried foods.  Cook foods by using methods other than frying. Baking, boiling, grilling, and broiling are all great options. Other fat-reducing suggestions include: ? Removing the skin from poultry. ? Removing all visible fats from meats. ? Skimming the fat off of stews, soups, and gravies before serving them. ? Steaming vegetables in water or broth.  Lose weight if you are overweight. Losing just 5-10% of your initial body weight can help your overall health and prevent diseases such as diabetes and heart disease.  Increase your consumption of nuts, legumes, and seeds to 4-5 servings per week. One serving of dried beans or legumes equals  cup after being cooked, one serving of nuts equals 1 ounces, and one serving of seeds equals  ounce or 1 tablespoon.  You may need to monitor your salt (sodium) intake, especially if you have high blood pressure. Talk with your health care provider or dietitian to get  more information about reducing sodium. What foods can I eat? Grains  Breads, including [place], white, pita, wheat, raisin, rye, oatmeal, and [place]. Tortillas that are neither fried nor made with lard or trans fat. Low-fat rolls, including hotdog and hamburger buns and English muffins. Biscuits. Muffins. Waffles. Pancakes. Light popcorn. Whole-grain cereals. Flatbread. Melba  toast. Pretzels. Breadsticks. Rusks. Low-fat snacks and crackers, including oyster, saltine, matzo, graham, animal, and rye. Rice and pasta, including brown rice and those that are made with whole wheat. Vegetables All vegetables. Fruits All fruits, but limit coconut. Meats and Other Protein Sources Lean, well-trimmed beef, veal, pork, and lamb. Chicken and [place] without skin. All fish and shellfish. Wild duck, rabbit, pheasant, and venison. Egg whites or low-cholesterol egg substitutes. Dried beans, peas, lentils, and tofu.Seeds and most nuts. Dairy Low-fat or nonfat cheeses, including ricotta, string, and mozzarella. Skim or 1% milk that is liquid, powdered, or evaporated. Buttermilk that is made with low-fat milk. Nonfat or low-fat yogurt. Beverages Mineral water. Diet carbonated beverages. Sweets and Desserts Sherbets and fruit ices. Honey, jam, marmalade, jelly, and syrups. Meringues and gelatins. Pure sugar candy, such as hard candy, jelly beans, gumdrops, mints, marshmallows, and small amounts of dark chocolate. [employer] Eat all sweets and desserts in moderation. Fats and Oils Nonhydrogenated (trans-free) margarines. Vegetable oils, including soybean, sesame, sunflower, olive, peanut, safflower, corn, canola, and cottonseed. Salad dressings or mayonnaise that are made with a vegetable oil. Limit added fats and oils that you use for cooking, baking, salads, and as spreads. Other Cocoa powder. Coffee and tea. All seasonings and condiments. The items listed above may not be a complete list of recommended foods or beverages. Contact your dietitian for more options. What foods are not recommended? Grains Breads that are made with saturated or trans fats, oils, or whole milk. Croissants. Butter rolls. Cheese breads. Sweet rolls. Donuts. Buttered popcorn. Chow mein noodles. High-fat crackers, such as cheese or butter crackers. Meats and Other Protein Sources Fatty meats, such as  hotdogs, short ribs, sausage, spareribs, bacon, ribeye roast or steak, and mutton. High-fat deli meats, such as salami and bologna. Caviar. Domestic duck and goose. Organ meats, such as kidney, liver, sweetbreads, brains, gizzard, chitterlings, and heart. Dairy Cream, sour cream, cream cheese, and creamed cottage cheese. Whole milk cheeses, including blue (bleu), Monterey Jack, [place], [place], American, [place], Swiss, [place], [place], and [place]. Whole or 2% milk that is liquid, evaporated, or condensed. Whole buttermilk. Cream sauce or high-fat cheese sauce. Yogurt that is made from whole milk. Beverages Regular sodas and drinks with added sugar. Sweets and Desserts Frosting. Pudding. Cookies. Cakes other than angel food cake. Candy that has milk chocolate or white chocolate, hydrogenated fat, butter, coconut, or unknown ingredients. Buttered syrups. Full-fat ice cream or ice cream drinks. Fats and Oils Gravy that has suet, meat fat, or shortening. Cocoa butter, hydrogenated oils, palm oil, coconut oil, palm kernel oil. These can often be found in baked products, candy, fried foods, nondairy creamers, and whipped toppings. Solid fats and shortenings, including bacon fat, salt pork, lard, and butter. Nondairy cream substitutes, such as coffee creamers and sour cream substitutes. Salad dressings that are made of unknown oils, cheese, or sour cream. The items listed above may not be a complete list of foods and beverages to avoid. Contact your dietitian for more information. This information is not intended to replace advice given to you by your health care provider. Make sure you discuss any questions you have with your health care  provider. Document Released: 09/21/2008 Document Revised: 07/02/2016 Document Reviewed: 06/06/2014 Elsevier Interactive Patient Education  2017 Elsevier Inc.   Chronic Obstructive Pulmonary Disease Chronic obstructive pulmonary disease (COPD) is a long-term (chronic)  lung problem. When you have COPD, it is hard for air to get in and out of your lungs. The way your lungs work will never return to normal. Usually the condition gets worse over time. There are things you can do to keep yourself as healthy as possible. Your doctor may treat your condition with:  Medicines.  Quitting smoking, if you smoke.  Rehabilitation. This may involve a team of specialists.  Oxygen.  Exercise and changes to your diet.  Lung surgery.  Comfort measures (palliative care).  Follow these instructions at home: Medicines  Take over-the-counter and prescription medicines only as told by your doctor.  Talk to your doctor before taking any cough or allergy medicines. You may need to avoid medicines that cause your lungs to be dry. Lifestyle  If you smoke, stop. Smoking makes the problem worse. If you need help quitting, ask your doctor.  Avoid being around things that make your breathing worse. This may include smoke, chemicals, and fumes.  Stay active, but remember to also rest.  Learn and use tips on how to relax.  Make sure you get enough sleep. Most adults need at least 7 hours a night.  Eat healthy foods. Eat smaller meals more often. Rest before meals. Controlled breathing  Learn and use tips on how to control your breathing as told by your doctor. Try: ? Breathing in (inhaling) through your nose for 1 second. Then, pucker your lips and breath out (exhale) through your lips for 2 seconds. ? Putting one hand on your belly (abdomen). Breathe in slowly through your nose for 1 second. Your hand on your belly should move out. Pucker your lips and breathe out slowly through your lips. Your hand on your belly should move in as you breathe out. Controlled coughing  Learn and use controlled coughing to clear mucus from your lungs. The steps are: 1. Lean your head a little forward. 2. Breathe in deeply. 3. Try to hold your breath for 3 seconds. 4. Keep your mouth  slightly open while coughing 2 times. 5. Spit any mucus out into a tissue. 6. Rest and do the steps again 1 or 2 times as needed. General instructions  Make sure you get all the shots (vaccines) that your doctor recommends. Ask your doctor about a flu shot and a pneumonia shot.  Use oxygen therapy and therapy to help improve your lungs (pulmonary rehabilitation) if told by your doctor. If you need home oxygen therapy, ask your doctor if you should buy a tool to measure your oxygen level (oximeter).  Make a COPD action plan with your doctor. This helps you know what to do if you feel worse than usual.  Manage any other conditions you have as told by your doctor.  Avoid going outside when it is very hot, cold, or humid.  Avoid people who have a sickness you can catch (contagious).  Keep all follow-up visits as told by your doctor. This is important. Contact a doctor if:  You cough up more mucus than usual.  There is a change in the color or thickness of the mucus.  It is harder to breathe than usual.  Your breathing is faster than usual.  You have trouble sleeping.  You need to use your medicines more often than usual.  You have trouble doing your normal activities such as getting dressed or walking around the house. Get help right away if:  You have shortness of breath while resting.  You have shortness of breath that stops you from: ? Being able to talk. ? Doing normal activities.  Your chest hurts for longer than 5 minutes.  Your skin color is more blue than usual.  Your pulse oximeter shows that you have low oxygen for longer than 5 minutes.  You have a fever.  You feel too tired to breathe normally. Summary  Chronic obstructive pulmonary disease (COPD) is a long-term lung problem.  The way your lungs work will never return to normal. Usually the condition gets worse over time. There are things you can do to keep yourself as healthy as possible.  Take  over-the-counter and prescription medicines only as told by your doctor.  If you smoke, stop. Smoking makes the problem worse. This information is not intended to replace advice given to you by your health care provider. Make sure you discuss any questions you have with your health care provider. Document Released: 05/31/2008 Document Revised: 05/20/2016 Document Reviewed: 08/09/2013 Elsevier Interactive Patient Education  2017 [employer].   Please continue all medications as directed. Levalbuterol (Xopenex) 47mcg/act inhaler and Levalbuterol (Xopenex) 0.63mg /3ml nebulizer solution sent in, please use per instructions. Continue to use oxygen when napping/sleeping. If you become breathless during the day, please don oxygen. Pulmonology referral placed. Avoid tobacco and alcohol. Please have home health check your blood pressure on Thursday and send [place] a message with the reading. Please call with any questions/concerns. Please keep your follow-up appt here in 2 weeks.

## 2017-11-22 NOTE — Assessment & Plan Note (Signed)
Please continue all medications as directed. Levalbuterol (Xopenex) 74mcg/act inhaler and Levalbuterol (Xopenex) 0.63mg /33ml nebulizer solution sent in, please use per instructions. Continue to use oxygen when napping/sleeping. If you become breathless during the day, please don oxygen. Pulmonology referral placed. O2 sat on RA today- 94%, no resp distress noted

## 2017-11-23 ENCOUNTER — Other Ambulatory Visit: Payer: Self-pay

## 2017-11-23 ENCOUNTER — Telehealth: Payer: Self-pay | Admitting: Adult Health

## 2017-11-23 DIAGNOSIS — Z7901 Long term (current) use of anticoagulants: Secondary | ICD-10-CM

## 2017-11-23 DIAGNOSIS — D7589 Other specified diseases of blood and blood-forming organs: Secondary | ICD-10-CM

## 2017-11-23 LAB — BASIC METABOLIC PANEL WITH GFR
BUN/Creatinine Ratio: 17 (ref 12–28)
BUN: 16 mg/dL (ref 8–27)
CO2: 25 mmol/L (ref 20–29)
Calcium: 9.1 mg/dL (ref 8.7–10.3)
Chloride: 102 mmol/L (ref 96–106)
Creatinine, Ser: 0.92 mg/dL (ref 0.57–1.00)
GFR calc Af Amer: 65 mL/min/1.73 (ref >59)
GFR calc non Af Amer: 57 mL/min/1.73 — ABNORMAL LOW (ref >59)
Glucose: 93 mg/dL (ref 65–99)
Potassium: 4.6 mmol/L (ref 3.5–5.2)
Sodium: 143 mmol/L (ref 134–144)

## 2017-11-23 LAB — CBC WITH DIFFERENTIAL/PLATELET
BASOS: 0 %
Basophils Absolute: 0 10*3/uL (ref 0.0–0.2)
EOS (ABSOLUTE): 0.1 10*3/uL (ref 0.0–0.4)
Eos: 1 %
Hematocrit: 37.3 % (ref 34.0–46.6)
Hemoglobin: 12.4 g/dL (ref 11.1–15.9)
Immature Grans (Abs): 0.1 10*3/uL (ref 0.0–0.1)
Immature Granulocytes: 1 %
Lymphocytes Absolute: 1.2 10*3/uL (ref 0.7–3.1)
Lymphs: 13 %
MCH: 33.9 pg — AB (ref 26.6–33.0)
MCHC: 33.2 g/dL (ref 31.5–35.7)
MCV: 102 fL — AB (ref 79–97)
MONOS ABS: 1 10*3/uL — AB (ref 0.1–0.9)
Monocytes: 11 %
NEUTROS ABS: 6.8 10*3/uL (ref 1.4–7.0)
Neutrophils: 74 %
PLATELETS: 225 10*3/uL (ref 150–379)
RBC: 3.66 x10E6/uL — ABNORMAL LOW (ref 3.77–5.28)
RDW: 13.7 % (ref 12.3–15.4)
WBC: 9.2 10*3/uL (ref 3.4–10.8)

## 2017-11-23 NOTE — Addendum Note (Signed)
Addended by: Stan Head on: 11/23/2017 07:59 AM   Modules accepted: Orders

## 2017-11-23 NOTE — Telephone Encounter (Signed)
Spoke with Judeth Cornfield at [employer], who states that albuterol inhaler was not covered by insurance.  She suggested that we try ProAir to see if insurance would cover this.  Verbal order given to Judeth Cornfield to switch to [employer] for insurance purposes.  She will let us know if this is not covered either so that we may attempt a prior authorization.  Tiajuana Amass, CMA

## 2017-11-23 NOTE — Telephone Encounter (Signed)
Stephanie at [facility] called late afternoon and left VM about patient's inhaler prescription not being covered by her insurance and needs to speak to someone about it.

## 2017-11-29 ENCOUNTER — Other Ambulatory Visit: Payer: Self-pay | Admitting: Adult Health

## 2017-12-06 ENCOUNTER — Other Ambulatory Visit: Payer: Self-pay | Admitting: Adult Health

## 2017-12-06 NOTE — Progress Notes (Deleted)
Subjective:    Patient ID: Christina Ortiz, female    DOB: 04/28/1929, 83 y.o.   MRN: [id number]  HPI:  11/13/18OV: Christina Ortiz is here for nasal drainage (white/thick),constant productive cough (yellow mucus), fatigue, and mild nausea without vomiting for >1.5 weeks and it is worsening.  She was in [place] visiting family last week for 7 days.  She reports consuming >5 vodka drinks a day for over a week.  She denies CP/dyspnea at rest/palpitations.  She also reports tobacco use while in [place].  [facility] picked her up from the airport yesterday and noticed her not feeling well.    11/22/17 OV: Christina Ortiz is here for f/u for hospitalization 1114/18- 11/14/17  Notes from Discharge: "Lower respiratory infection secondary to RSV Acute respiratory failure with hypoxia Viral panel significant for RSV infection. Patient with upper respiratory wheezing. It is possible she has an underlying lung disease with history of smoking. Oxygen requirement increased to 4L and she was able to be weaned to room air at rest. On ambulation, she remained 88%, up to 93 on 2 L oxygen and qualified for oxygen prior to discharge. Pulmonology consulted during admission and recommended outpatient PFT. Steroids given as a burst and discontinued prior to discharge. Xopenex on discharge in addition to nebulizer machine. COPD Chronic bronchitic changes seen on chest x-ray. This complicated patient's course with regard to oxygen requirement. Recommend formal PFT as an outpatient as mentioned above. Bronchodilators as mentioned above.  Hypothyroidism Continued Synthroid CKD stage II Stable. Essential hypertension Stable. Continued metoprolol and amlodipine Paroxysmal atrial fibrillation Rate controlled. Continued metoprolol and Xarelto. Depression Continued Paxil Alcoholic binge drinking Secondary to recent loss of husband per chart review. CIWA scores of  zero. Macrocytosis Mild. Likely secondary to alcohol use. Vitamin B12 and folate within normal limits. Outpatient follow-up"  She has been using home O2- 1L only when sleeping. Home pulse oximeter her readings >92% on RA. She denies CP/dyspnea at rest/palpitations.  She has a non-productive cough, denies fever/night sweats and reports that her appetite is normal.  She reports several loose stools since returning home, none in the last 2 days.  She denies blood in urine/stool or abdominal pain. Home Health intake RN visited on 11/19/17 and next home visit with RN is scheduled for 11/24/17.  Home PT scheduled for 11/24/17. [facility]  12/07/17 OV; Christina Ortiz is here for 6 month f/u:   Patient Care Team    Relationship Specialty Notifications Start End  [name], [name], NP PCP - General Family Medicine  03/30/17   Jake BatheSkains, Mark C, MD Consulting Physician Cardiology  03/30/17   Mateo FlowHecker, Kathryn, MD Consulting Physician Ophthalmology  03/30/17   [facility]    03/30/17     Patient Active Problem List   Diagnosis Date Noted  . Macrocytosis 11/22/2017  . Acute bronchitis due to respiratory syncytial virus (RSV) 11/09/2017  . Acute respiratory failure with hypoxia (HCC) 11/09/2017  . Hypertension 11/09/2017  . PAF (paroxysmal atrial fibrillation) (HCC) 11/09/2017  . Coronary artery disease 11/09/2017  . CKD (chronic kidney disease) stage 2, GFR 60-89 ml/min 11/09/2017  . Hypothyroidism 11/09/2017  . Dyspnea 11/08/2017  . Upper respiratory tract infection 11/08/2017  . Elevated serum creatinine 06/21/2017  . Edema of both ankles 06/21/2017  . Diarrhea 05/25/2017  . Hypokalemia 05/25/2017  . Alcohol abuse 05/25/2017  . Depression, recurrent (HCC) 03/30/2017  . Lumbago 03/30/2017  . Influenza with respiratory manifestation 01/22/2017  .  Coronary artery disease due to lipid rich plaque 04/04/2015  . Essential hypertension 04/04/2015  .  Hyperlipidemia 04/04/2015  . Former smoker 04/04/2015  . Chronic anticoagulation 04/04/2015     Past Medical History:  Diagnosis Date  . Acute bronchitis 10/2017  . Arthritis   . CKD (chronic kidney disease)    Xarelto dose is 15 mg QD  . Coronary artery disease    a. s/p PCI/stenting to RCA and LAD ([facility] in [place]) in 2006; b. Nuc 1/15: No ischemia, EF 70%  . Depression   . Gastritis   . History of cholecystectomy   . History of echocardiogram    a. Echo 1/15: Moderate LVH, EF 55-60%, impaired relaxation, mild AS  . Hyperlipidemia   . Hypertension   . Hypokalemia   . Mobitz type 1 second degree atrioventricular block    a. Event Monitor 3/15: NSR, first-degree AV block, second-degree AB block - Mobitz 1, PACs, NSVT (5 beats)  . PAF (paroxysmal atrial fibrillation) (HCC)   . Thyroid disease      Past Surgical History:  Procedure Laterality Date  . BREAST LUMPECTOMY Left   . CARDIAC CATHETERIZATION    . CATARACT EXTRACTION Bilateral   . CHOLECYSTECTOMY    . DILATION AND CURETTAGE OF UTERUS    . EYE SURGERY       Family History  Problem Relation Age of Onset  . Heart disease Mother   . Heart attack Mother   . Lung cancer Father   . Cancer Father        lung     Social History   Substance and Sexual Activity  Drug Use No     Social History   Substance and Sexual Activity  Alcohol Use Yes  . Alcohol/week: 6.0 oz  . Types: 10 Shots of liquor per week     Social History   Tobacco Use  Smoking Status Former Smoker  . Packs/day: 0.50  . Years: 67.00  . Pack years: 33.50  . Types: Cigarettes  . Last attempt to quit: 10/25/2015  . Years since quitting: 2.1  Smokeless Tobacco Never Used  Tobacco Comment   quit 2015     Outpatient Encounter Medications as of 12/07/2017  Medication Sig  . amLODipine (NORVASC) 10 MG tablet Take 1 tablet (10 mg total) by mouth daily.  . benzonatate (TESSALON) 100 MG capsule Take 1 capsule (100 mg total)  2 (two) times daily as needed by mouth for cough.  . carboxymethylcellul-glycerin ([facility] LUBRICATING/DRY EYE) 0.5-0.9 % ophthalmic solution Place 2 drops as needed into both eyes for dry eyes.  Marland Kitchen doxazosin (CARDURA) 8 MG tablet Take 1 tablet (8 mg total) by mouth daily.  . fluticasone (FLONASE) 50 MCG/ACT nasal spray Place 2 sprays daily into both nostrils.  Marland Kitchen levalbuterol (XOPENEX HFA) 45 MCG/ACT inhaler Inhale 2 puffs into the lungs every 8 (eight) hours as needed for wheezing or shortness of breath.  . levalbuterol (XOPENEX) 0.63 MG/3ML nebulizer solution INHALE THE CONTENTS OF 1 VIAL VIA NEBULIZER EVERY 8 HOURS AS NEEDED FOR WHEEZING OR SHORTNESS OF BREATH.  Marland Kitchen levothyroxine (SYNTHROID, LEVOTHROID) 75 MCG tablet Take 1 tablet (75 mcg total) daily before breakfast by mouth.  . metoprolol succinate (TOPROL-XL) 25 MG 24 hr tablet Take 1 tablet (25 mg total) by mouth daily.  Marland Kitchen omeprazole (PRILOSEC) 20 MG capsule Take 1 capsule (20 mg total) by mouth daily as needed (heartburn).  Marland Kitchen PARoxetine (PAXIL) 40 MG tablet Take 1 tablet (40  mg total) by mouth daily.  . potassium chloride (K-DUR) 10 MEQ tablet Take 1 tablet (10 mEq total) by mouth daily.  . quinapril (ACCUPRIL) 40 MG tablet Take 1 tablet (40 mg total) by mouth daily.  . simvastatin (ZOCOR) 5 MG tablet Take 1 tablet (5 mg total) by mouth every evening.  [name] 15 MG TABS tablet TAKE 1 TABLET (15 MG TOTAL) BY MOUTH DAILY WITH SUPPER.   No facility-administered encounter medications on file as of 12/07/2017.     Allergies: Tape  There is no height or weight on file to calculate BMI.  There were no vitals taken for this visit.   Review of Systems  Constitutional: Positive for activity change, appetite change, chills, fever and unexpected weight change. Negative for diaphoresis.  HENT: Positive for congestion.   Eyes: Negative for visual disturbance.  Respiratory: Positive for cough and shortness of breath. Negative for chest tightness,  wheezing and stridor.   Cardiovascular: Negative for chest pain, palpitations and leg swelling.  Gastrointestinal: Positive for nausea. Negative for abdominal distention, abdominal pain, anal bleeding, blood in stool, constipation and diarrhea.  Endocrine: Positive for cold intolerance. Negative for heat intolerance, polydipsia, polyphagia and polyuria.  Genitourinary: Negative for difficulty urinating, flank pain and hematuria.  Neurological: Negative for dizziness and headaches.  Hematological: Bruises/bleeds easily.  Psychiatric/Behavioral: Positive for sleep disturbance.       Objective:   Physical Exam  Constitutional: She is oriented to person, place, and time. She appears well-developed and well-nourished. No distress.  HENT:  Head: Normocephalic and atraumatic.  Right Ear: External ear and ear canal normal. Tympanic membrane is not erythematous and not bulging. Decreased hearing is noted.  Left Ear: External ear and ear canal normal. Tympanic membrane is not erythematous and not bulging. Decreased hearing is noted.  Nose: Mucosal edema and rhinorrhea present. Right sinus exhibits no maxillary sinus tenderness and no frontal sinus tenderness. Left sinus exhibits no maxillary sinus tenderness and no frontal sinus tenderness.  Mouth/Throat: Uvula is midline, oropharynx is clear and moist and mucous membranes are normal. Abnormal dentition.  Eyes: Conjunctivae are normal. Pupils are equal, round, and reactive to light.  Neck: Normal range of motion. Neck supple.  Cardiovascular: Normal rate, regular rhythm and intact distal pulses.  Murmur heard. Pulmonary/Chest: Effort normal. No respiratory distress. She has decreased breath sounds in the right lower field and the left lower field. She has no wheezes. She has no rhonchi. She has no rales. She exhibits no tenderness.  Lymphadenopathy:    She has no cervical adenopathy.  Neurological: She is alert and oriented to person, place, and  time.  Skin: Skin is warm and dry. No rash noted. She is not diaphoretic. No erythema. No pallor.  Psychiatric: She has a normal mood and affect. Her behavior is normal. Judgment and thought content normal.          Assessment & Plan:   No diagnosis found.  No problem-specific Assessment & Plan notes found for this encounter.  We discussed AT LENGTH that she needs to abstain from tobacco/ETOH use  FOLLOW-UP:  No Follow-up on file.

## 2017-12-07 ENCOUNTER — Ambulatory Visit: Payer: Medicare PPO | Admitting: Adult Health

## 2017-12-08 ENCOUNTER — Encounter: Payer: Self-pay | Admitting: Pulmonary Disease

## 2017-12-08 ENCOUNTER — Ambulatory Visit (INDEPENDENT_AMBULATORY_CARE_PROVIDER_SITE_OTHER): Payer: Medicare PPO | Admitting: Pulmonary Disease

## 2017-12-08 VITALS — BP 118/80 | HR 85 | Ht 62.0 in | Wt 150.6 lb

## 2017-12-08 DIAGNOSIS — R0602 Shortness of breath: Secondary | ICD-10-CM

## 2017-12-08 NOTE — Progress Notes (Signed)
Christina Ortiz    [id number]    1929/06/17  Primary Care Physician:Danford, Jinny Blossom, NP  Referring Physician: Julaine Fusi, NP [address] [place], [place] [address]  Chief complaint: Consult for dyspnea  HPI: 83 year old former smoker with coronary artery disease, paroxysmal atrial fibrillation on Xarelto, hypothyroidism, chronic kidney disease and hypertension. She was hospitalized in mid November for acute respiratory failure with hypoxia secondary to RSV infection.  She had traveled to [place] prior to this illness and was sitting next to a passenger with bronchitis and coughing.  She quit smoking 3 years ago but resumed during the week she was in [place].  Antibiotics were started during this admission but were discontinued as there is low suspicion for infection and pro calcitonin level was low. She was treated with nebs, steroids and discharged on 2 L oxygen.  She is not using the oxygen on a regular basis.  She monitors her oxygen levels at home and they have remained about 90%.  Pulmonary was consulted during the admission and recommended outpatient pulmonary function test.  Pets: None Occupation: Retired Diplomatic Services operational officer for an [employer] Exposures: None Smoking history: 60-pack-year smoking history.  Quit in 2015 except for a week November Travel History: To [place] and [place] this year.  No other significant travel.  Outpatient Encounter Medications as of 12/08/2017  Medication Sig  . amLODipine (NORVASC) 10 MG tablet Take 1 tablet (10 mg total) by mouth daily.  . benzonatate (TESSALON) 100 MG capsule Take 1 capsule (100 mg total) 2 (two) times daily as needed by mouth for cough.  . carboxymethylcellul-glycerin ([employer] LUBRICATING/DRY EYE) 0.5-0.9 % ophthalmic solution Place 2 drops as needed into both eyes for dry eyes.  Marland Kitchen doxazosin (CARDURA) 8 MG tablet Take 1 tablet (8 mg total) by mouth daily.  . fluticasone (FLONASE) 50 MCG/ACT nasal spray Place 2 sprays  daily into both nostrils.  Marland Kitchen levalbuterol (XOPENEX HFA) 45 MCG/ACT inhaler Inhale 2 puffs into the lungs every 8 (eight) hours as needed for wheezing or shortness of breath.  . levalbuterol (XOPENEX) 0.63 MG/3ML nebulizer solution INHALE THE CONTENTS OF 1 VIAL VIA NEBULIZER EVERY 8 HOURS AS NEEDED FOR WHEEZING OR SHORTNESS OF BREATH.  Marland Kitchen levothyroxine (SYNTHROID, LEVOTHROID) 75 MCG tablet Take 1 tablet (75 mcg total) daily before breakfast by mouth.  . metoprolol succinate (TOPROL-XL) 25 MG 24 hr tablet Take 1 tablet (25 mg total) by mouth daily.  Marland Kitchen omeprazole (PRILOSEC) 20 MG capsule Take 1 capsule (20 mg total) by mouth daily as needed (heartburn).  Marland Kitchen PARoxetine (PAXIL) 40 MG tablet Take 1 tablet (40 mg total) by mouth daily.  . potassium chloride (K-DUR) 10 MEQ tablet Take 1 tablet (10 mEq total) by mouth daily.  . quinapril (ACCUPRIL) 40 MG tablet Take 1 tablet (40 mg total) by mouth daily.  . simvastatin (ZOCOR) 5 MG tablet Take 1 tablet (5 mg total) by mouth every evening.  [name] 15 MG TABS tablet TAKE 1 TABLET (15 MG TOTAL) BY MOUTH DAILY WITH SUPPER.   No facility-administered encounter medications on file as of 12/08/2017.     Allergies as of 12/08/2017 - Review Complete 11/22/2017  Allergen Reaction Noted  . Tape Other (See Comments) 01/23/2017    Past Medical History:  Diagnosis Date  . Acute bronchitis 10/2017  . Arthritis   . CKD (chronic kidney disease)    Xarelto dose is 15 mg QD  . Coronary artery disease  a. s/p PCI/stenting to RCA and LAD ([facility] in [place]) in 2006; b. Nuc 1/15: No ischemia, EF 70%  . Depression   . Gastritis   . History of cholecystectomy   . History of echocardiogram    a. Echo 1/15: Moderate LVH, EF 55-60%, impaired relaxation, mild AS  . Hyperlipidemia   . Hypertension   . Hypokalemia   . Mobitz type 1 second degree atrioventricular block    a. Event Monitor 3/15: NSR, first-degree AV block, second-degree AB block - Mobitz 1,  PACs, NSVT (5 beats)  . PAF (paroxysmal atrial fibrillation) (HCC)   . Thyroid disease     Past Surgical History:  Procedure Laterality Date  . BREAST LUMPECTOMY Left   . CARDIAC CATHETERIZATION    . CATARACT EXTRACTION Bilateral   . CHOLECYSTECTOMY    . DILATION AND CURETTAGE OF UTERUS    . EYE SURGERY      Family History  Problem Relation Age of Onset  . Heart disease Mother   . Heart attack Mother   . Lung cancer Father   . Cancer Father        lung    Social History   Socioeconomic History  . Marital status: Widowed    Spouse name: Not on file  . Number of children: Not on file  . Years of education: Not on file  . Highest education level: Not on file  Social Needs  . Financial resource strain: Not on file  . Food insecurity - worry: Not on file  . Food insecurity - inability: Not on file  . Transportation needs - medical: Not on file  . Transportation needs - non-medical: Not on file  Occupational History  . Not on file  Tobacco Use  . Smoking status: Former Smoker    Packs/day: 0.50    Years: 67.00    Pack years: 33.50    Types: Cigarettes    Last attempt to quit: 10/25/2015    Years since quitting: 2.1  . Smokeless tobacco: Never Used  . Tobacco comment: quit 2015  Substance and Sexual Activity  . Alcohol use: Yes    Alcohol/week: 6.0 oz    Types: 10 Shots of liquor per week  . Drug use: No  . Sexual activity: No  Other Topics Concern  . Not on file  Social History Narrative  . Not on file    Review of systems: Review of Systems  Constitutional: Negative for fever and chills.  HENT: Negative.   Eyes: Negative for blurred vision.  Respiratory: as per HPI  Cardiovascular: Negative for chest pain and palpitations.  Gastrointestinal: Negative for vomiting, diarrhea, blood per rectum. Genitourinary: Negative for dysuria, urgency, frequency and hematuria.  Musculoskeletal: Negative for myalgias, back pain and joint pain.  Skin: Negative for  itching and rash.  Neurological: Negative for dizziness, tremors, focal weakness, seizures and loss of consciousness.  Endo/Heme/Allergies: Negative for environmental allergies.  Psychiatric/Behavioral: Negative for depression, suicidal ideas and hallucinations.  All other systems reviewed and are negative.  Physical Exam: Blood pressure 118/80, pulse 85, height 5\' 2"  (1.575 m), weight 150 lb 9.6 oz (68.3 kg), SpO2 96 %. Gen:      No acute distress HEENT:  EOMI, sclera anicteric Neck:     No masses; no thyromegaly Lungs:    Clear to auscultation bilaterally; normal respiratory effort CV:         Regular rate and rhythm; no murmurs Abd:      +  bowel sounds; soft, non-tender; no palpable masses, no distension Ext:    No edema; adequate peripheral perfusion Skin:      Warm and dry; no rash Neuro: alert and oriented x 3 Psych: normal mood and affect  Data Reviewed: Chest x-ray 11/09/17-chronic peribronchial thickening.  No lung infiltrate noted I have reviewed the images personally.  Assessment:  Acute bronchitis secondary to RSV infection She is making good recovery from her recent hospitalization.  She may have COPD from smoking history.  We will schedule pulmonary function test to evaluate Does not need to be on any controller medication at present and will continue on Xopenex nebulizers as needed. Continue supplemental oxygen at night for now until PFTs can be reviewed.  Plan/Recommendations: -PFTs -Continue Xopenex inhaler and nebulizer as needed -Continue supplemental oxygen at night  Chilton GreathousePraveen Dionte Blaustein MD [facility] Pager [phone] 12/08/2017, 2:49 PM  CC: [name], [name], NP

## 2017-12-08 NOTE — Patient Instructions (Signed)
I am glad your breathing is improving after your discharge Continue using the Blazer and Xopenex inhaler as needed We will schedule you for pulmonary function test for evaluation of the lung function Follow-up in 3 months.

## 2018-01-17 ENCOUNTER — Ambulatory Visit (INDEPENDENT_AMBULATORY_CARE_PROVIDER_SITE_OTHER): Payer: Medicare PPO | Admitting: Adult Health

## 2018-01-17 ENCOUNTER — Encounter: Payer: Self-pay | Admitting: Adult Health

## 2018-01-17 VITALS — BP 110/68 | HR 64 | Ht 62.0 in | Wt 153.0 lb

## 2018-01-17 DIAGNOSIS — I10 Essential (primary) hypertension: Secondary | ICD-10-CM

## 2018-01-17 DIAGNOSIS — R351 Nocturia: Secondary | ICD-10-CM

## 2018-01-17 DIAGNOSIS — N182 Chronic kidney disease, stage 2 (mild): Secondary | ICD-10-CM | POA: Diagnosis not present

## 2018-01-17 DIAGNOSIS — Z0000 Encounter for general adult medical examination without abnormal findings: Secondary | ICD-10-CM | POA: Diagnosis not present

## 2018-01-17 DIAGNOSIS — E785 Hyperlipidemia, unspecified: Secondary | ICD-10-CM | POA: Diagnosis not present

## 2018-01-17 NOTE — Assessment & Plan Note (Addendum)
BP at goal 110/68, HR 64 Home BP readings: SBP 110-130s, DBP 60-80s She denies acute cardiac sx's Continue quinapril 40mg  daily, Toprol 25mg  daily, Norvasc 10mg  daily, and  Doxazosin 8mg  daily Instructed to keep checking BP daily and if SBP <100 or DBP <60, or she experiences dizziness- call clinic Sig reduced ETOH may require BP meds to be reduced. We will re-check BP next week after her lab appt. Followed by cards every 6 months, last OV 09/2017

## 2018-01-17 NOTE — Progress Notes (Signed)
Subjective:    Patient ID: Christina Ortiz, female    DOB: 05-21-1929, 84 y.o.   MRN: [id number]  HPI:  11/13/18OV: Christina Ortiz is here for nasal drainage (white/thick),constant productive cough (yellow mucus), fatigue, and mild nausea without vomiting for >1.5 weeks and it is worsening.  She was in [place] visiting family last week for 7 days.  She reports consuming >5 vodka drinks a day for over a week.  She denies CP/dyspnea at rest/palpitations.  She also reports tobacco use while in [place].  Her daughter (who is at [facility]) picked her up from the airport yesterday and noticed her not feeling well.    11/22/17 OV: Christina Ortiz is here for f/u for hospitalization 1114/18- 11/14/17  Notes from Discharge: "Lower respiratory infection secondary to RSV Acute respiratory failure with hypoxia Viral panel significant for RSV infection. Patient with upper respiratory wheezing. It is possible she has an underlying lung disease with history of smoking. Oxygen requirement increased to 4L and she was able to be weaned to room air at rest. On ambulation, she remained 88%, up to 93 on 2 L oxygen and qualified for oxygen prior to discharge. Pulmonology consulted during admission and recommended outpatient PFT. Steroids given as a burst and discontinued prior to discharge. Xopenex on discharge in addition to nebulizer machine. COPD Chronic bronchitic changes seen on chest x-ray. This complicated patient's course with regard to oxygen requirement. Recommend formal PFT as an outpatient as mentioned above. Bronchodilators as mentioned above.  Hypothyroidism Continued Synthroid CKD stage II Stable. Essential hypertension Stable. Continued metoprolol and amlodipine Paroxysmal atrial fibrillation Rate controlled. Continued metoprolol and Xarelto. Depression Continued Paxil Alcoholic binge drinking Secondary to recent loss of husband per chart review. CIWA scores of  zero. Macrocytosis Mild. Likely secondary to alcohol use. Vitamin B12 and folate within normal limits. Outpatient follow-up"  She has been using home O2- 1L only when sleeping. Home pulse oximeter her readings >92% on RA. She denies CP/dyspnea at rest/palpitations.  She has a non-productive cough, denies fever/night sweats and reports that her appetite is normal.  She reports several loose stools since returning home, none in the last 2 days.  She denies blood in urine/stool or abdominal pain. Home Health intake RN visited on 11/19/17 and next home visit with RN is scheduled for 11/24/17.  Home PT scheduled for 11/24/17. Daughter at [facility] during OV  01/17/18 OV: Christina Ortiz is here for f/u: HTN, Depression.   She was seen by Pulmonology 11/28/17, PFT ordered and she was instructed to continue Xopenex inhaler/nebulizer as needed,supplemental oxygen at night. She has not used inhaler/neb since beginning of Dec 2018 and doe snot require O2 during day, but has been using 2L at night. She has been instructed to f/u with pulm next month She has not used tobacco since Nov 2018 and has dramatically rduced wine consumption.  She does not keep ETOH in her home and only drinks when out to dinner/cocktail parties, last use 01/13/18- 3 glasses/wine. She recently had session with personal trainer- GREAT! Overall she feels that her health is "good, for being an old lady". One new complaint- nocturia, she estimates to urinate >3 times/night   Patient Care Team    Relationship Specialty Notifications Start End  William Hamburger D, NP PCP - General Family Medicine  03/30/17   Jake Bathe, MD Consulting Physician Cardiology  03/30/17   Mateo Flow, MD Consulting Physician Ophthalmology  03/30/17   [facility]    03/30/17  Patient Active Problem List   Diagnosis Date Noted  . Healthcare maintenance 01/17/2018  . Nocturia 01/17/2018  . Macrocytosis 11/22/2017  . Acute  bronchitis due to respiratory syncytial virus (RSV) 11/09/2017  . Acute respiratory failure with hypoxia (HCC) 11/09/2017  . Hypertension 11/09/2017  . PAF (paroxysmal atrial fibrillation) (HCC) 11/09/2017  . Coronary artery disease 11/09/2017  . CKD (chronic kidney disease) stage 2, GFR 60-89 ml/min 11/09/2017  . Hypothyroidism 11/09/2017  . Dyspnea 11/08/2017  . Upper respiratory tract infection 11/08/2017  . Elevated serum creatinine 06/21/2017  . Edema of both ankles 06/21/2017  . Diarrhea 05/25/2017  . Hypokalemia 05/25/2017  . Alcohol abuse 05/25/2017  . Depression, recurrent (HCC) 03/30/2017  . Lumbago 03/30/2017  . Influenza with respiratory manifestation 01/22/2017  . Coronary artery disease due to lipid rich plaque 04/04/2015  . Essential hypertension 04/04/2015  . Hyperlipidemia 04/04/2015  . Former smoker 04/04/2015  . Chronic anticoagulation 04/04/2015     Past Medical History:  Diagnosis Date  . Acute bronchitis 10/2017  . Arthritis   . CKD (chronic kidney disease)    Xarelto dose is 15 mg QD  . Coronary artery disease    a. s/p PCI/stenting to RCA and LAD ([facility] in [place]) in 2006; b. Nuc 1/15: No ischemia, EF 70%  . Depression   . Gastritis   . History of cholecystectomy   . History of echocardiogram    a. Echo 1/15: Moderate LVH, EF 55-60%, impaired relaxation, mild AS  . Hyperlipidemia   . Hypertension   . Hypokalemia   . Mobitz type 1 second degree atrioventricular block    a. Event Monitor 3/15: NSR, first-degree AV block, second-degree AB block - Mobitz 1, PACs, NSVT (5 beats)  . PAF (paroxysmal atrial fibrillation) (HCC)   . Thyroid disease      Past Surgical History:  Procedure Laterality Date  . BREAST LUMPECTOMY Left   . CARDIAC CATHETERIZATION    . CATARACT EXTRACTION Bilateral   . CHOLECYSTECTOMY    . DILATION AND CURETTAGE OF UTERUS    . EYE SURGERY       Family History  Problem Relation Age of Onset  . Heart disease  Mother   . Heart attack Mother   . Lung cancer Father   . Cancer Father        lung     Social History   Substance and Sexual Activity  Drug Use No     Social History   Substance and Sexual Activity  Alcohol Use Yes  . Alcohol/week: 6.0 oz  . Types: 10 Shots of liquor per week     Social History   Tobacco Use  Smoking Status Former Smoker  . Packs/day: 0.50  . Years: 67.00  . Pack years: 33.50  . Types: Cigarettes  . Last attempt to quit: 10/25/2015  . Years since quitting: 2.2  Smokeless Tobacco Never Used  Tobacco Comment   quit 2015     Outpatient Encounter Medications as of 01/17/2018  Medication Sig  . amLODipine (NORVASC) 10 MG tablet Take 1 tablet (10 mg total) by mouth daily.  . benzonatate (TESSALON) 100 MG capsule Take 1 capsule (100 mg total) 2 (two) times daily as needed by mouth for cough.  . carboxymethylcellul-glycerin ([employer] LUBRICATING/DRY EYE) 0.5-0.9 % ophthalmic solution Place 2 drops as needed into both eyes for dry eyes.  Marland Kitchen doxazosin (CARDURA) 8 MG tablet Take 1 tablet (8 mg total) by mouth daily.  . fluticasone (FLONASE)  50 MCG/ACT nasal spray Place 2 sprays daily into both nostrils.  Marland Kitchen levalbuterol (XOPENEX HFA) 45 MCG/ACT inhaler Inhale 2 puffs into the lungs every 8 (eight) hours as needed for wheezing or shortness of breath.  . levalbuterol (XOPENEX) 0.63 MG/3ML nebulizer solution INHALE THE CONTENTS OF 1 VIAL VIA NEBULIZER EVERY 8 HOURS AS NEEDED FOR WHEEZING OR SHORTNESS OF BREATH.  Marland Kitchen levothyroxine (SYNTHROID, LEVOTHROID) 75 MCG tablet Take 1 tablet (75 mcg total) daily before breakfast by mouth.  . metoprolol succinate (TOPROL-XL) 25 MG 24 hr tablet Take 1 tablet (25 mg total) by mouth daily.  Marland Kitchen omeprazole (PRILOSEC) 20 MG capsule Take 1 capsule (20 mg total) by mouth daily as needed (heartburn).  Marland Kitchen PARoxetine (PAXIL) 40 MG tablet Take 1 tablet (40 mg total) by mouth daily.  . potassium chloride (K-DUR) 10 MEQ tablet Take 1 tablet (10  mEq total) by mouth daily.  . quinapril (ACCUPRIL) 40 MG tablet Take 1 tablet (40 mg total) by mouth daily.  . simvastatin (ZOCOR) 5 MG tablet Take 1 tablet (5 mg total) by mouth every evening.  [name] 15 MG TABS tablet TAKE 1 TABLET (15 MG TOTAL) BY MOUTH DAILY WITH SUPPER.  . VENTOLIN HFA 108 (90 Base) MCG/ACT inhaler Inhale 2 puffs into the lungs every 4 (four) hours as needed.   No facility-administered encounter medications on file as of 01/17/2018.     Allergies: Tape  Body mass index is 27.98 kg/m.  Blood pressure 110/68, pulse 64, height 5\' 2"  (1.575 m), weight 153 lb (69.4 kg), SpO2 96 %.   Review of Systems  Constitutional: Positive for fatigue. Negative for activity change, appetite change, chills, diaphoresis, fever and unexpected weight change.  HENT: Negative for congestion.   Eyes: Negative for visual disturbance.  Respiratory: Negative for cough, chest tightness, shortness of breath, wheezing and stridor.   Cardiovascular: Negative for chest pain, palpitations and leg swelling.  Gastrointestinal: Negative for abdominal distention, abdominal pain, anal bleeding, blood in stool, constipation, diarrhea, nausea and vomiting.  Endocrine: Negative for cold intolerance, heat intolerance, polydipsia, polyphagia and polyuria.  Genitourinary: Negative for difficulty urinating, flank pain and hematuria.  Neurological: Negative for dizziness and headaches.  Hematological: Bruises/bleeds easily.  Psychiatric/Behavioral: Positive for sleep disturbance.       Objective:   Physical Exam  Constitutional: She is oriented to person, place, and time. She appears well-developed and well-nourished. No distress.  HENT:  Head: Normocephalic and atraumatic.  Right Ear: External ear and ear canal normal. Decreased hearing is noted.  Left Ear: External ear and ear canal normal. Tympanic membrane is not erythematous. Decreased hearing is noted.  Mouth/Throat: Uvula is midline,  oropharynx is clear and moist and mucous membranes are normal. Abnormal dentition.  Eyes: Conjunctivae are normal. Pupils are equal, round, and reactive to light.  Neck: Normal range of motion. Neck supple.  Cardiovascular: Normal rate, regular rhythm and intact distal pulses.  Murmur heard. Pulmonary/Chest: Effort normal. No respiratory distress. She has no decreased breath sounds. She has no wheezes. She has no rhonchi. She has no rales. She exhibits no tenderness.  Lymphadenopathy:    She has no cervical adenopathy.  Neurological: She is alert and oriented to person, place, and time.  Skin: Skin is warm and dry. No rash noted. She is not diaphoretic. No erythema. No pallor.  Psychiatric: She has a normal mood and affect. Her behavior is normal. Judgment and thought content normal.  Nursing note and vitals reviewed.  Assessment & Plan:   1. Healthcare maintenance   2. Essential hypertension   3. Hyperlipidemia, unspecified hyperlipidemia type   4. Nocturia   5. CKD (chronic kidney disease) stage 2, GFR 60-89 ml/min     Healthcare maintenance Continue all medications as directed. Please follow-up with Pulmonology next month and cardiology as directed. Please schedule fasting labs appt ASAP Please schedule CPE in 6 months.  Essential hypertension BP at goal 110/68, HR 64 Home BP readings: SBP 110-130s, DBP 60-80s She denies acute cardiac sx's Continue quinapril 40mg  daily, Toprol 25mg  daily, Norvasc 10mg  daily, and  Doxazosin 8mg  daily Instructed to keep checking BP daily and if SBP <100 or DBP <60, or she experiences dizziness- call clinic Sig reduced ETOH may require BP meds to be reduced. We will re-check BP next week after her lab appt. Followed by cards every 6 months, last OV 09/2017  Hyperlipidemia Ref Range & Units 66yr ago   Triglycerides 40 - 160 mg/dL 95   Cholesterol 0 - 387 mg/dL 564   HDL 35 - 70 mg/dL 59   LDL Cholesterol mg/dL 69   Resulting  Agency  Abstrct Entry       Last Resulted: 09/06/16     Currently taking simvastatin 5mg  daily Will recheck Lipids ASAP    Pt was in the office today for 25+ minutes, with over 50% time spent in face to face counseling of patient's various medical conditions and in coordination of care FOLLOW-UP:   Return in about 6 months (around 07/17/2018) for CPE.

## 2018-01-17 NOTE — Patient Instructions (Signed)
Heart-Healthy Eating Plan Many factors influence your heart health, including eating and exercise habits. Heart (coronary) risk increases with abnormal blood fat (lipid) levels. Heart-healthy meal planning includes limiting unhealthy fats, increasing healthy fats, and making other small dietary changes. This includes maintaining a healthy body weight to help keep lipid levels within a normal range. What is my plan? Your health care provider recommends that you:  Get no more than ___25___% of the total calories in your daily diet from fat.  Limit your intake of saturated fat to less than _5___% of your total calories each day.  Limit the amount of cholesterol in your diet to less than ___300___ mg per day.  What types of fat should I choose?  Choose healthy fats more often. Choose monounsaturated and polyunsaturated fats, such as olive oil and canola oil, flaxseeds, walnuts, almonds, and seeds.  Eat more omega-3 fats. Good choices include salmon, mackerel, sardines, tuna, flaxseed oil, and ground flaxseeds. Aim to eat fish at least two times each week.  Limit saturated fats. Saturated fats are primarily found in animal products, such as meats, butter, and cream. Plant sources of saturated fats include palm oil, palm kernel oil, and coconut oil.  Avoid foods with partially hydrogenated oils in them. These contain trans fats. Examples of foods that contain trans fats are stick margarine, some tub margarines, cookies, crackers, and other baked goods. What general guidelines do I need to follow?  Check food labels carefully to identify foods with trans fats or high amounts of saturated fat.  Fill one half of your plate with vegetables and green salads. Eat 4-5 servings of vegetables per day. A serving of vegetables equals 1 cup of raw leafy vegetables,  cup of raw or cooked cut-up vegetables, or  cup of vegetable juice.  Fill one fourth of your plate with whole grains. Look for the word  "whole" as the first word in the ingredient list.  Fill one fourth of your plate with lean protein foods.  Eat 4-5 servings of fruit per day. A serving of fruit equals one medium whole fruit,  cup of dried fruit,  cup of fresh, frozen, or canned fruit, or  cup of 100% fruit juice.  Eat more foods that contain soluble fiber. Examples of foods that contain this type of fiber are apples, broccoli, carrots, beans, peas, and barley. Aim to get 20-30 g of fiber per day.  Eat more home-cooked food and less restaurant, buffet, and fast food.  Limit or avoid alcohol.  Limit foods that are high in starch and sugar.  Avoid fried foods.  Cook foods by using methods other than frying. Baking, boiling, grilling, and broiling are all great options. Other fat-reducing suggestions include: ? Removing the skin from poultry. ? Removing all visible fats from meats. ? Skimming the fat off of stews, soups, and gravies before serving them. ? Steaming vegetables in water or broth.  Lose weight if you are overweight. Losing just 5-10% of your initial body weight can help your overall health and prevent diseases such as diabetes and heart disease.  Increase your consumption of nuts, legumes, and seeds to 4-5 servings per week. One serving of dried beans or legumes equals  cup after being cooked, one serving of nuts equals 1 ounces, and one serving of seeds equals  ounce or 1 tablespoon.  You may need to monitor your salt (sodium) intake, especially if you have high blood pressure. Talk with your health care provider or dietitian to get  more information about reducing sodium. What foods can I eat? Grains  Breads, including French, white, pita, wheat, raisin, rye, oatmeal, and Italian. Tortillas that are neither fried nor made with lard or trans fat. Low-fat rolls, including hotdog and hamburger buns and English muffins. Biscuits. Muffins. Waffles. Pancakes. Light popcorn. Whole-grain cereals. Flatbread.  Melba toast. Pretzels. Breadsticks. Rusks. Low-fat snacks and crackers, including oyster, saltine, matzo, graham, animal, and rye. Rice and pasta, including brown rice and those that are made with whole wheat. Vegetables All vegetables. Fruits All fruits, but limit coconut. Meats and Other Protein Sources Lean, well-trimmed beef, veal, pork, and lamb. Chicken and turkey without skin. All fish and shellfish. Wild duck, rabbit, pheasant, and venison. Egg whites or low-cholesterol egg substitutes. Dried beans, peas, lentils, and tofu.Seeds and most nuts. Dairy Low-fat or nonfat cheeses, including ricotta, string, and mozzarella. Skim or 1% milk that is liquid, powdered, or evaporated. Buttermilk that is made with low-fat milk. Nonfat or low-fat yogurt. Beverages Mineral water. Diet carbonated beverages. Sweets and Desserts Sherbets and fruit ices. Honey, jam, marmalade, jelly, and syrups. Meringues and gelatins. Pure sugar candy, such as hard candy, jelly beans, gumdrops, mints, marshmallows, and small amounts of dark chocolate. Angel food cake. Eat all sweets and desserts in moderation. Fats and Oils Nonhydrogenated (trans-free) margarines. Vegetable oils, including soybean, sesame, sunflower, olive, peanut, safflower, corn, canola, and cottonseed. Salad dressings or mayonnaise that are made with a vegetable oil. Limit added fats and oils that you use for cooking, baking, salads, and as spreads. Other Cocoa powder. Coffee and tea. All seasonings and condiments. The items listed above may not be a complete list of recommended foods or beverages. Contact your dietitian for more options. What foods are not recommended? Grains Breads that are made with saturated or trans fats, oils, or whole milk. Croissants. Butter rolls. Cheese breads. Sweet rolls. Donuts. Buttered popcorn. Chow mein noodles. High-fat crackers, such as cheese or butter crackers. Meats and Other Protein Sources Fatty meats, such  as hotdogs, short ribs, sausage, spareribs, bacon, ribeye roast or steak, and mutton. High-fat deli meats, such as salami and bologna. Caviar. Domestic duck and goose. Organ meats, such as kidney, liver, sweetbreads, brains, gizzard, chitterlings, and heart. Dairy Cream, sour cream, cream cheese, and creamed cottage cheese. Whole milk cheeses, including blue (bleu), Monterey Jack, Brie, Colby, American, Havarti, Swiss, cheddar, Camembert, and [place]. Whole or 2% milk that is liquid, evaporated, or condensed. Whole buttermilk. Cream sauce or high-fat cheese sauce. Yogurt that is made from whole milk. Beverages Regular sodas and drinks with added sugar. Sweets and Desserts Frosting. Pudding. Cookies. Cakes other than angel food cake. Candy that has milk chocolate or white chocolate, hydrogenated fat, butter, coconut, or unknown ingredients. Buttered syrups. Full-fat ice cream or ice cream drinks. Fats and Oils Gravy that has suet, meat fat, or shortening. Cocoa butter, hydrogenated oils, palm oil, coconut oil, palm kernel oil. These can often be found in baked products, candy, fried foods, nondairy creamers, and whipped toppings. Solid fats and shortenings, including bacon fat, salt pork, lard, and butter. Nondairy cream substitutes, such as coffee creamers and sour cream substitutes. Salad dressings that are made of unknown oils, cheese, or sour cream. The items listed above may not be a complete list of foods and beverages to avoid. Contact your dietitian for more information. This information is not intended to replace advice given to you by your health care provider. Make sure you discuss any questions you have with your health care   provider. Document Released: 09/21/2008 Document Revised: 07/02/2016 Document Reviewed: 06/06/2014 Elsevier Interactive Patient Education  [employer].  Continue all medications as directed. Please follow-up with Pulmonology next month and cardiology as  directed. Please schedule fasting labs appt ASAP Please schedule CPE in 6 months. NICE TO SEE YOU!

## 2018-01-17 NOTE — Assessment & Plan Note (Signed)
Continue all medications as directed. Please follow-up with Pulmonology next month and cardiology as directed. Please schedule fasting labs appt ASAP Please schedule CPE in 6 months.

## 2018-01-17 NOTE — Assessment & Plan Note (Signed)
Do not drink fluids too closely to bedtime. Urology referral placed 

## 2018-01-17 NOTE — Assessment & Plan Note (Signed)
Ref Range & Units 57yr ago   Triglycerides 40 - 160 mg/dL 95   Cholesterol 0 - 901 mg/dL 222   HDL 35 - 70 mg/dL 59   LDL Cholesterol mg/dL 69   Resulting Agency  Abstrct Entry       Last Resulted: 09/06/16     Currently taking simvastatin 5mg  daily Will recheck Lipids ASAP

## 2018-01-19 NOTE — Progress Notes (Signed)
Subjective:    Patient ID: Christina Ortiz, female    DOB: 1929-12-17, 84 y.o.   MRN: [id number]  HPI:  11/13/18OV: Christina Ortiz is here for nasal drainage (white/thick),constant productive cough (yellow mucus), fatigue, and mild nausea without vomiting for >1.5 weeks and it is worsening.  She was in [place] visiting family last week for 7 days.  She reports consuming >5 vodka drinks a day for over a week.  She denies CP/dyspnea at rest/palpitations.  She also reports tobacco use while in [place].  Her daughter (who is at [facility]) picked her up from the airport yesterday and noticed her not feeling well.    11/22/17 OV: Christina Ortiz is here for f/u for hospitalization 1114/18- 11/14/17  Notes from Discharge: "Lower respiratory infection secondary to RSV Acute respiratory failure with hypoxia Viral panel significant for RSV infection. Patient with upper respiratory wheezing. It is possible she has an underlying lung disease with history of smoking. Oxygen requirement increased to 4L and she was able to be weaned to room air at rest. On ambulation, she remained 88%, up to 93 on 2 L oxygen and qualified for oxygen prior to discharge. Pulmonology consulted during admission and recommended outpatient PFT. Steroids given as a burst and discontinued prior to discharge. Xopenex on discharge in addition to nebulizer machine. COPD Chronic bronchitic changes seen on chest x-ray. This complicated patient's course with regard to oxygen requirement. Recommend formal PFT as an outpatient as mentioned above. Bronchodilators as mentioned above.  Hypothyroidism Continued Synthroid CKD stage II Stable. Essential hypertension Stable. Continued metoprolol and amlodipine Paroxysmal atrial fibrillation Rate controlled. Continued metoprolol and Xarelto. Depression Continued Paxil Alcoholic binge drinking Secondary to recent loss of husband per chart review. CIWA scores of  zero. Macrocytosis Mild. Likely secondary to alcohol use. Vitamin B12 and folate within normal limits. Outpatient follow-up"  She has been using home O2- 1L only when sleeping. Home pulse oximeter her readings >92% on RA. She denies CP/dyspnea at rest/palpitations.  She has a non-productive cough, denies fever/night sweats and reports that her appetite is normal.  She reports several loose stools since returning home, none in the last 2 days.  She denies blood in urine/stool or abdominal pain. Home Health intake RN visited on 11/19/17 and next home visit with RN is scheduled for 11/24/17.  Home PT scheduled for 11/24/17. Daughter at [facility] during OV  01/17/18 OV: Christina Ortiz is here for f/u: HTN, Depression.   She was seen by Pulmonology 11/28/17, PFT ordered and she was instructed to continue Xopenex inhaler/nebulizer as needed,supplemental oxygen at night. She has not used inhaler/neb since beginning of Dec 2018 and doe snot require O2 during day, but has been using 2L at night. She has been instructed to f/u with pulm next month She has not used tobacco since Nov 2018 and has dramatically rduced wine consumption.  She does not keep ETOH in her home and only drinks when out to dinner/cocktail parties, last use 01/13/18- 3 glasses/wine. She recently had session with personal trainer- GREAT! Overall she feels that her health is "good, for being an old lady". One new complaint- nocturia, she estimates to urinate >3 times/night  01/24/18 OV: Christina Ortiz is here for labs and BP check. With her sig reduction in ETOH use and she is on 4 antihypertensives- She denies CP/dyspnea/dizziness/HA/hypotension with position changes.  Patient Care Team    Relationship Specialty Notifications Start End  William Hamburger D, NP PCP - General Family Medicine  03/30/17  Jake Bathe, MD Consulting Physician Cardiology  03/30/17   Mateo Flow, MD Consulting Physician Ophthalmology  03/30/17   [facility]  [facility]    03/30/17     Patient Active Problem List   Diagnosis Date Noted  . Healthcare maintenance 01/17/2018  . Nocturia 01/17/2018  . Macrocytosis 11/22/2017  . Acute bronchitis due to respiratory syncytial virus (RSV) 11/09/2017  . Acute respiratory failure with hypoxia (HCC) 11/09/2017  . Hypertension 11/09/2017  . PAF (paroxysmal atrial fibrillation) (HCC) 11/09/2017  . Coronary artery disease 11/09/2017  . CKD (chronic kidney disease) stage 2, GFR 60-89 ml/min 11/09/2017  . Hypothyroidism 11/09/2017  . Dyspnea 11/08/2017  . Upper respiratory tract infection 11/08/2017  . Elevated serum creatinine 06/21/2017  . Edema of both ankles 06/21/2017  . Diarrhea 05/25/2017  . Hypokalemia 05/25/2017  . Alcohol abuse 05/25/2017  . Depression, recurrent (HCC) 03/30/2017  . Lumbago 03/30/2017  . Influenza with respiratory manifestation 01/22/2017  . Coronary artery disease due to lipid rich plaque 04/04/2015  . Essential hypertension 04/04/2015  . Hyperlipidemia 04/04/2015  . Former smoker 04/04/2015  . Chronic anticoagulation 04/04/2015     Past Medical History:  Diagnosis Date  . Acute bronchitis 10/2017  . Arthritis   . CKD (chronic kidney disease)    Xarelto dose is 15 mg QD  . Coronary artery disease    a. s/p PCI/stenting to RCA and LAD ([facility] in [place]) in 2006; b. Nuc 1/15: No ischemia, EF 70%  . Depression   . Gastritis   . History of cholecystectomy   . History of echocardiogram    a. Echo 1/15: Moderate LVH, EF 55-60%, impaired relaxation, mild AS  . Hyperlipidemia   . Hypertension   . Hypokalemia   . Mobitz type 1 second degree atrioventricular block    a. Event Monitor 3/15: NSR, first-degree AV block, second-degree AB block - Mobitz 1, PACs, NSVT (5 beats)  . PAF (paroxysmal atrial fibrillation) (HCC)   . Thyroid disease      Past Surgical History:  Procedure Laterality Date  . BREAST LUMPECTOMY Left   . CARDIAC  CATHETERIZATION    . CATARACT EXTRACTION Bilateral   . CHOLECYSTECTOMY    . DILATION AND CURETTAGE OF UTERUS    . EYE SURGERY       Family History  Problem Relation Age of Onset  . Heart disease Mother   . Heart attack Mother   . Lung cancer Father   . Cancer Father        lung     Social History   Substance and Sexual Activity  Drug Use No     Social History   Substance and Sexual Activity  Alcohol Use Yes  . Alcohol/week: 6.0 oz  . Types: 10 Shots of liquor per week     Social History   Tobacco Use  Smoking Status Former Smoker  . Packs/day: 0.50  . Years: 67.00  . Pack years: 33.50  . Types: Cigarettes  . Last attempt to quit: 10/25/2015  . Years since quitting: 2.2  Smokeless Tobacco Never Used  Tobacco Comment   quit 2015     Outpatient Encounter Medications as of 01/24/2018  Medication Sig  . amLODipine (NORVASC) 10 MG tablet Take 1 tablet (10 mg total) by mouth daily.  . benzonatate (TESSALON) 100 MG capsule Take 1 capsule (100 mg total) 2 (two) times daily as needed by mouth for cough.  . carboxymethylcellul-glycerin ([facility] LUBRICATING/DRY EYE) 0.5-0.9 %  ophthalmic solution Place 2 drops as needed into both eyes for dry eyes.  Marland Kitchen doxazosin (CARDURA) 8 MG tablet Take 1 tablet (8 mg total) by mouth daily.  . fluticasone (FLONASE) 50 MCG/ACT nasal spray Place 2 sprays daily into both nostrils.  Marland Kitchen levalbuterol (XOPENEX HFA) 45 MCG/ACT inhaler Inhale 2 puffs into the lungs every 8 (eight) hours as needed for wheezing or shortness of breath.  . levalbuterol (XOPENEX) 0.63 MG/3ML nebulizer solution INHALE THE CONTENTS OF 1 VIAL VIA NEBULIZER EVERY 8 HOURS AS NEEDED FOR WHEEZING OR SHORTNESS OF BREATH.  Marland Kitchen levothyroxine (SYNTHROID, LEVOTHROID) 75 MCG tablet Take 1 tablet (75 mcg total) daily before breakfast by mouth.  . metoprolol succinate (TOPROL-XL) 25 MG 24 hr tablet Take 1 tablet (25 mg total) by mouth daily.  Marland Kitchen omeprazole (PRILOSEC) 20 MG capsule Take 1  capsule (20 mg total) by mouth daily as needed (heartburn).  Marland Kitchen PARoxetine (PAXIL) 40 MG tablet Take 1 tablet (40 mg total) by mouth daily.  . potassium chloride (K-DUR) 10 MEQ tablet Take 1 tablet (10 mEq total) by mouth daily.  . quinapril (ACCUPRIL) 40 MG tablet Take 1 tablet (40 mg total) by mouth daily.  . simvastatin (ZOCOR) 5 MG tablet Take 1 tablet (5 mg total) by mouth every evening.  . VENTOLIN HFA 108 (90 Base) MCG/ACT inhaler Inhale 2 puffs into the lungs every 4 (four) hours as needed.  [name] 15 MG TABS tablet TAKE 1 TABLET (15 MG TOTAL) BY MOUTH DAILY WITH SUPPER.   No facility-administered encounter medications on file as of 01/24/2018.     Allergies: Tape  There is no height or weight on file to calculate BMI.  Blood pressure 118/68.   Review of Systems  Constitutional: Positive for fatigue. Negative for activity change, appetite change, chills, diaphoresis, fever and unexpected weight change.  HENT: Negative for congestion.   Eyes: Negative for visual disturbance.  Respiratory: Negative for cough, chest tightness, shortness of breath, wheezing and stridor.   Cardiovascular: Negative for chest pain, palpitations and leg swelling.  Gastrointestinal: Negative for abdominal distention, abdominal pain, anal bleeding, blood in stool, constipation, diarrhea, nausea and vomiting.  Endocrine: Negative for cold intolerance, heat intolerance, polydipsia, polyphagia and polyuria.  Genitourinary: Negative for difficulty urinating, flank pain and hematuria.  Neurological: Negative for dizziness and headaches.  Hematological: Bruises/bleeds easily.  Psychiatric/Behavioral: Positive for sleep disturbance.       Objective:   Physical Exam  Constitutional: She is oriented to person, place, and time. She appears well-developed and well-nourished. No distress.  HENT:  Head: Normocephalic and atraumatic.  Right Ear: External ear and ear canal normal. Decreased hearing is noted.   Left Ear: External ear and ear canal normal. Tympanic membrane is not erythematous. Decreased hearing is noted.  Mouth/Throat: Uvula is midline, oropharynx is clear and moist and mucous membranes are normal. Abnormal dentition.  Eyes: Conjunctivae are normal. Pupils are equal, round, and reactive to light.  Neck: Normal range of motion. Neck supple.  Cardiovascular: Normal rate, regular rhythm and intact distal pulses.  Murmur heard. Pulmonary/Chest: Effort normal. No respiratory distress. She has no decreased breath sounds. She has no wheezes. She has no rhonchi. She has no rales. She exhibits no tenderness.  Lymphadenopathy:    She has no cervical adenopathy.  Neurological: She is alert and oriented to person, place, and time.  Skin: Skin is warm and dry. No rash noted. She is not diaphoretic. No erythema. No pallor.  Psychiatric: She has a  normal mood and affect. Her behavior is normal. Judgment and thought content normal.  Nursing note and vitals reviewed.         Assessment & Plan:   1. Essential hypertension   2. CKD (chronic kidney disease) stage 2, GFR 60-89 ml/min   3. Hypothyroidism, unspecified type   4. Hyperlipidemia, unspecified hyperlipidemia type     Essential hypertension BP 118/68 HR 70 She denies acute cardiac sx's Labs obtained today. She has dramatically reduced her ETOH use, encouraged to drink plenty of water and continue to avoid ETOH      FOLLOW-UP:   Return in about 6 months (around 07/24/2018) for CPE.

## 2018-01-24 ENCOUNTER — Ambulatory Visit (INDEPENDENT_AMBULATORY_CARE_PROVIDER_SITE_OTHER): Payer: Medicare PPO | Admitting: Adult Health

## 2018-01-24 ENCOUNTER — Encounter: Payer: Self-pay | Admitting: Adult Health

## 2018-01-24 ENCOUNTER — Other Ambulatory Visit: Payer: Medicare PPO

## 2018-01-24 DIAGNOSIS — N182 Chronic kidney disease, stage 2 (mild): Secondary | ICD-10-CM

## 2018-01-24 DIAGNOSIS — I10 Essential (primary) hypertension: Secondary | ICD-10-CM

## 2018-01-24 DIAGNOSIS — E785 Hyperlipidemia, unspecified: Secondary | ICD-10-CM

## 2018-01-24 DIAGNOSIS — E039 Hypothyroidism, unspecified: Secondary | ICD-10-CM

## 2018-01-24 NOTE — Assessment & Plan Note (Signed)
BP 118/68 HR 70 She denies acute cardiac sx's Labs obtained today. She has dramatically reduced her ETOH use, encouraged to drink plenty of water and continue to avoid ETOH

## 2018-01-24 NOTE — Patient Instructions (Signed)

## 2018-01-25 ENCOUNTER — Other Ambulatory Visit: Payer: Self-pay | Admitting: Adult Health

## 2018-01-25 LAB — BASIC METABOLIC PANEL
BUN / CREAT RATIO: 18 (ref 12–28)
BUN: 15 mg/dL (ref 8–27)
CHLORIDE: 103 mmol/L (ref 96–106)
CO2: 23 mmol/L (ref 20–29)
Calcium: 9.3 mg/dL (ref 8.7–10.3)
Creatinine, Ser: 0.84 mg/dL (ref 0.57–1.00)
GFR calc Af Amer: 73 mL/min/{1.73_m2} (ref >59)
GFR calc non Af Amer: 63 mL/min/{1.73_m2} (ref >59)
Glucose: 90 mg/dL (ref 65–99)
POTASSIUM: 3.9 mmol/L (ref 3.5–5.2)
Sodium: 144 mmol/L (ref 134–144)

## 2018-01-25 LAB — LIPID PANEL
CHOL/HDL RATIO: 2.3 ratio (ref 0.0–4.4)
Cholesterol, Total: 132 mg/dL (ref 100–199)
HDL: 57 mg/dL (ref >39)
LDL Calculated: 60 mg/dL (ref 0–99)
Triglycerides: 77 mg/dL (ref 0–149)
VLDL Cholesterol Cal: 15 mg/dL (ref 5–40)

## 2018-01-25 LAB — TSH: TSH: 0.97 u[IU]/mL (ref 0.450–4.500)

## 2018-01-25 MED ORDER — LEVOTHYROXINE SODIUM 75 MCG PO TABS: 75.0000 ug | ORAL_TABLET | Freq: Every day | ORAL | 1 refills | Status: DC

## 2018-01-26 ENCOUNTER — Other Ambulatory Visit: Payer: Self-pay | Admitting: Adult Health

## 2018-02-06 ENCOUNTER — Other Ambulatory Visit: Payer: Self-pay | Admitting: Cardiology

## 2018-02-08 ENCOUNTER — Other Ambulatory Visit: Payer: Self-pay | Admitting: Adult Health

## 2018-02-13 ENCOUNTER — Other Ambulatory Visit: Payer: Self-pay | Admitting: Adult Health

## 2018-02-16 DIAGNOSIS — J205 Acute bronchitis due to respiratory syncytial virus: Secondary | ICD-10-CM | POA: Diagnosis not present

## 2018-02-16 DIAGNOSIS — R0609 Other forms of dyspnea: Secondary | ICD-10-CM | POA: Diagnosis not present

## 2018-02-16 DIAGNOSIS — J9601 Acute respiratory failure with hypoxia: Secondary | ICD-10-CM | POA: Diagnosis not present

## 2018-02-20 ENCOUNTER — Telehealth: Payer: Self-pay | Admitting: Cardiology

## 2018-02-20 NOTE — Telephone Encounter (Signed)
Patient daughter, [name] calling,   Pt c/o swelling: STAT is pt has developed SOB within 24 hours  How much weight have you gained and in what time span? Patient believes that she has gained weight   1) If swelling, where is the swelling located? Legs to ankles and rash from knees to toes  2) Are you currently taking a fluid pill? yes  3) Are you currently SOB? Patient is on oxygen at night   4) Do you have a log of your daily weights (if so, list)? No   5) Have you gained 3 pounds in a day or 5 pounds in a week? Not sure  6) Have you traveled recently? No

## 2018-02-20 NOTE — Telephone Encounter (Signed)
Spoke with [name] (daughter on [place]) who reports pt has a rash from her knees to her toe.  She reports it is red and looks bumpy but doesn't know if it is raised.  Pt has not c/o itching.  She also reports noting some swelling at her ankles on Saturday.  No mention of any increase in SOB etc.  Advised she could use knee high compression stocking and to keep feet and legs elevated above the level of her heart as much as possible,  Also advised to see PCP for further evaluation of rash.  Daughter states understanding and will call pt's PCP.

## 2018-03-07 ENCOUNTER — Ambulatory Visit: Payer: Medicare PPO | Admitting: Pulmonary Disease

## 2018-03-16 DIAGNOSIS — R0609 Other forms of dyspnea: Secondary | ICD-10-CM | POA: Diagnosis not present

## 2018-03-16 DIAGNOSIS — J9601 Acute respiratory failure with hypoxia: Secondary | ICD-10-CM | POA: Diagnosis not present

## 2018-03-16 DIAGNOSIS — J205 Acute bronchitis due to respiratory syncytial virus: Secondary | ICD-10-CM | POA: Diagnosis not present

## 2018-03-21 ENCOUNTER — Telehealth: Payer: Self-pay | Admitting: Cardiology

## 2018-03-21 NOTE — Telephone Encounter (Signed)
William Hamburger, NP has been refilling.   Pharmacy aware should check with follow up with that office.

## 2018-03-21 NOTE — Telephone Encounter (Signed)
New message     Is patient suppose to be  potassium chloride (K-DUR) 10 MEQ tablet TAKE 1 TABLET (10 MEQ TOTAL) BY MOUTH DAILY.   Since her fluid pill has been D/C

## 2018-03-30 ENCOUNTER — Encounter (HOSPITAL_COMMUNITY): Payer: Self-pay | Admitting: Emergency Medicine

## 2018-03-30 ENCOUNTER — Emergency Department (HOSPITAL_COMMUNITY): Payer: Medicare PPO

## 2018-03-30 DIAGNOSIS — Z9181 History of falling: Secondary | ICD-10-CM | POA: Diagnosis not present

## 2018-03-30 DIAGNOSIS — M6281 Muscle weakness (generalized): Secondary | ICD-10-CM | POA: Diagnosis not present

## 2018-03-30 DIAGNOSIS — Z9049 Acquired absence of other specified parts of digestive tract: Secondary | ICD-10-CM

## 2018-03-30 DIAGNOSIS — Z23 Encounter for immunization: Secondary | ICD-10-CM | POA: Diagnosis present

## 2018-03-30 DIAGNOSIS — E039 Hypothyroidism, unspecified: Secondary | ICD-10-CM | POA: Diagnosis present

## 2018-03-30 DIAGNOSIS — K219 Gastro-esophageal reflux disease without esophagitis: Secondary | ICD-10-CM | POA: Diagnosis present

## 2018-03-30 DIAGNOSIS — E876 Hypokalemia: Secondary | ICD-10-CM | POA: Diagnosis present

## 2018-03-30 DIAGNOSIS — I10 Essential (primary) hypertension: Secondary | ICD-10-CM | POA: Diagnosis present

## 2018-03-30 DIAGNOSIS — F10129 Alcohol abuse with intoxication, unspecified: Secondary | ICD-10-CM | POA: Diagnosis present

## 2018-03-30 DIAGNOSIS — I2510 Atherosclerotic heart disease of native coronary artery without angina pectoris: Secondary | ICD-10-CM | POA: Diagnosis present

## 2018-03-30 DIAGNOSIS — S42301D Unspecified fracture of shaft of humerus, right arm, subsequent encounter for fracture with routine healing: Secondary | ICD-10-CM | POA: Diagnosis not present

## 2018-03-30 DIAGNOSIS — Z955 Presence of coronary angioplasty implant and graft: Secondary | ICD-10-CM | POA: Diagnosis not present

## 2018-03-30 DIAGNOSIS — M62838 Other muscle spasm: Secondary | ICD-10-CM | POA: Diagnosis present

## 2018-03-30 DIAGNOSIS — Z79899 Other long term (current) drug therapy: Secondary | ICD-10-CM | POA: Diagnosis not present

## 2018-03-30 DIAGNOSIS — Z8249 Family history of ischemic heart disease and other diseases of the circulatory system: Secondary | ICD-10-CM | POA: Diagnosis not present

## 2018-03-30 DIAGNOSIS — W06XXXA Fall from bed, initial encounter: Secondary | ICD-10-CM | POA: Diagnosis present

## 2018-03-30 DIAGNOSIS — R680 Hypothermia, not associated with low environmental temperature: Secondary | ICD-10-CM | POA: Diagnosis present

## 2018-03-30 DIAGNOSIS — Z7901 Long term (current) use of anticoagulants: Secondary | ICD-10-CM

## 2018-03-30 DIAGNOSIS — S42331A Displaced oblique fracture of shaft of humerus, right arm, initial encounter for closed fracture: Principal | ICD-10-CM | POA: Diagnosis present

## 2018-03-30 DIAGNOSIS — S6991XA Unspecified injury of right wrist, hand and finger(s), initial encounter: Secondary | ICD-10-CM | POA: Diagnosis not present

## 2018-03-30 DIAGNOSIS — Z79890 Hormone replacement therapy (postmenopausal): Secondary | ICD-10-CM | POA: Diagnosis not present

## 2018-03-30 DIAGNOSIS — F329 Major depressive disorder, single episode, unspecified: Secondary | ICD-10-CM | POA: Diagnosis present

## 2018-03-30 DIAGNOSIS — Y92092 Bedroom in other non-institutional residence as the place of occurrence of the external cause: Secondary | ICD-10-CM

## 2018-03-30 DIAGNOSIS — M6282 Rhabdomyolysis: Secondary | ICD-10-CM | POA: Diagnosis present

## 2018-03-30 DIAGNOSIS — E785 Hyperlipidemia, unspecified: Secondary | ICD-10-CM | POA: Diagnosis present

## 2018-03-30 DIAGNOSIS — Z87891 Personal history of nicotine dependence: Secondary | ICD-10-CM | POA: Diagnosis not present

## 2018-03-30 DIAGNOSIS — R7989 Other specified abnormal findings of blood chemistry: Secondary | ICD-10-CM | POA: Diagnosis not present

## 2018-03-30 DIAGNOSIS — M79601 Pain in right arm: Secondary | ICD-10-CM | POA: Diagnosis not present

## 2018-03-30 DIAGNOSIS — Z4789 Encounter for other orthopedic aftercare: Secondary | ICD-10-CM | POA: Diagnosis not present

## 2018-03-30 DIAGNOSIS — W19XXXA Unspecified fall, initial encounter: Secondary | ICD-10-CM | POA: Diagnosis not present

## 2018-03-30 DIAGNOSIS — T68XXXA Hypothermia, initial encounter: Secondary | ICD-10-CM | POA: Diagnosis not present

## 2018-03-30 DIAGNOSIS — I480 Paroxysmal atrial fibrillation: Secondary | ICD-10-CM | POA: Diagnosis present

## 2018-03-30 DIAGNOSIS — F339 Major depressive disorder, recurrent, unspecified: Secondary | ICD-10-CM | POA: Diagnosis not present

## 2018-03-30 DIAGNOSIS — R2689 Other abnormalities of gait and mobility: Secondary | ICD-10-CM | POA: Diagnosis not present

## 2018-03-30 DIAGNOSIS — F1010 Alcohol abuse, uncomplicated: Secondary | ICD-10-CM | POA: Diagnosis not present

## 2018-03-30 DIAGNOSIS — M79621 Pain in right upper arm: Secondary | ICD-10-CM | POA: Diagnosis present

## 2018-03-30 DIAGNOSIS — G8911 Acute pain due to trauma: Secondary | ICD-10-CM | POA: Diagnosis not present

## 2018-03-30 DIAGNOSIS — S42301A Unspecified fracture of shaft of humerus, right arm, initial encounter for closed fracture: Secondary | ICD-10-CM | POA: Diagnosis not present

## 2018-03-30 DIAGNOSIS — M25511 Pain in right shoulder: Secondary | ICD-10-CM | POA: Diagnosis not present

## 2018-03-30 DIAGNOSIS — S59911A Unspecified injury of right forearm, initial encounter: Secondary | ICD-10-CM | POA: Diagnosis not present

## 2018-03-30 DIAGNOSIS — F10929 Alcohol use, unspecified with intoxication, unspecified: Secondary | ICD-10-CM | POA: Diagnosis not present

## 2018-03-30 DIAGNOSIS — T796XXA Traumatic ischemia of muscle, initial encounter: Secondary | ICD-10-CM | POA: Diagnosis not present

## 2018-03-30 DIAGNOSIS — R55 Syncope and collapse: Secondary | ICD-10-CM | POA: Diagnosis not present

## 2018-03-30 LAB — CBC
HCT: 39.9 % (ref 36.0–46.0)
Hemoglobin: 12.7 g/dL (ref 12.0–15.0)
MCH: 31.6 pg (ref 26.0–34.0)
MCHC: 31.8 g/dL (ref 30.0–36.0)
MCV: 99.3 fL (ref 78.0–100.0)
PLATELETS: 204 10*3/uL (ref 150–400)
RBC: 4.02 MIL/uL (ref 3.87–5.11)
RDW: 13.3 % (ref 11.5–15.5)
WBC: 11.3 10*3/uL — AB (ref 4.0–10.5)

## 2018-03-30 LAB — BASIC METABOLIC PANEL
ANION GAP: 15 (ref 5–15)
BUN: 29 mg/dL — ABNORMAL HIGH (ref 6–20)
CALCIUM: 8.9 mg/dL (ref 8.9–10.3)
CO2: 18 mmol/L — ABNORMAL LOW (ref 22–32)
CREATININE: 1 mg/dL (ref 0.44–1.00)
Chloride: 108 mmol/L (ref 101–111)
GFR, EST AFRICAN AMERICAN: 57 mL/min — AB (ref >60)
GFR, EST NON AFRICAN AMERICAN: 50 mL/min — AB (ref >60)
Glucose, Bld: 110 mg/dL — ABNORMAL HIGH (ref 65–99)
Potassium: 3.9 mmol/L (ref 3.5–5.1)
SODIUM: 141 mmol/L (ref 135–145)

## 2018-03-30 LAB — ETHANOL: Alcohol, Ethyl (B): 159 mg/dL — ABNORMAL HIGH (ref <10)

## 2018-03-30 LAB — CK: CK TOTAL: 3046 U/L — AB (ref 38–234)

## 2018-03-30 NOTE — ED Triage Notes (Signed)
BIB EMS from home, pt found on floor by daughter around 2030, pt unsure of how long she had been lying there. ETOH on board, empty bottle of liquor found at the house. Unknown LOC. Pt reports R arm pain, bruising and swelling noted. VSS.

## 2018-03-31 ENCOUNTER — Emergency Department (HOSPITAL_COMMUNITY): Payer: Medicare PPO

## 2018-03-31 ENCOUNTER — Inpatient Hospital Stay (HOSPITAL_COMMUNITY)
Admission: EM | Admit: 2018-03-31 | Discharge: 2018-04-04 | DRG: 563 | Disposition: A | Discharge status: Skilled Nursing Facility | Payer: Medicare PPO | Attending: Internal Medicine | Admitting: Internal Medicine

## 2018-03-31 DIAGNOSIS — F10129 Alcohol abuse with intoxication, unspecified: Secondary | ICD-10-CM | POA: Diagnosis present

## 2018-03-31 DIAGNOSIS — E785 Hyperlipidemia, unspecified: Secondary | ICD-10-CM | POA: Diagnosis present

## 2018-03-31 DIAGNOSIS — Z79890 Hormone replacement therapy (postmenopausal): Secondary | ICD-10-CM | POA: Diagnosis not present

## 2018-03-31 DIAGNOSIS — E039 Hypothyroidism, unspecified: Secondary | ICD-10-CM | POA: Diagnosis present

## 2018-03-31 DIAGNOSIS — T796XXA Traumatic ischemia of muscle, initial encounter: Secondary | ICD-10-CM | POA: Diagnosis not present

## 2018-03-31 DIAGNOSIS — F1010 Alcohol abuse, uncomplicated: Secondary | ICD-10-CM | POA: Diagnosis not present

## 2018-03-31 DIAGNOSIS — M629 Disorder of muscle, unspecified: Secondary | ICD-10-CM | POA: Diagnosis present

## 2018-03-31 DIAGNOSIS — I2510 Atherosclerotic heart disease of native coronary artery without angina pectoris: Secondary | ICD-10-CM | POA: Diagnosis present

## 2018-03-31 DIAGNOSIS — Z79899 Other long term (current) drug therapy: Secondary | ICD-10-CM | POA: Diagnosis not present

## 2018-03-31 DIAGNOSIS — Z7901 Long term (current) use of anticoagulants: Secondary | ICD-10-CM | POA: Diagnosis not present

## 2018-03-31 DIAGNOSIS — S42301A Unspecified fracture of shaft of humerus, right arm, initial encounter for closed fracture: Secondary | ICD-10-CM

## 2018-03-31 DIAGNOSIS — Z23 Encounter for immunization: Secondary | ICD-10-CM | POA: Diagnosis present

## 2018-03-31 DIAGNOSIS — W06XXXA Fall from bed, initial encounter: Secondary | ICD-10-CM | POA: Diagnosis not present

## 2018-03-31 DIAGNOSIS — E876 Hypokalemia: Secondary | ICD-10-CM | POA: Diagnosis present

## 2018-03-31 DIAGNOSIS — K219 Gastro-esophageal reflux disease without esophagitis: Secondary | ICD-10-CM | POA: Diagnosis present

## 2018-03-31 DIAGNOSIS — M6282 Rhabdomyolysis: Secondary | ICD-10-CM | POA: Diagnosis present

## 2018-03-31 DIAGNOSIS — M79621 Pain in right upper arm: Secondary | ICD-10-CM | POA: Diagnosis present

## 2018-03-31 DIAGNOSIS — F339 Major depressive disorder, recurrent, unspecified: Secondary | ICD-10-CM | POA: Diagnosis present

## 2018-03-31 DIAGNOSIS — Z87891 Personal history of nicotine dependence: Secondary | ICD-10-CM | POA: Diagnosis not present

## 2018-03-31 DIAGNOSIS — R680 Hypothermia, not associated with low environmental temperature: Secondary | ICD-10-CM | POA: Diagnosis present

## 2018-03-31 DIAGNOSIS — Z8249 Family history of ischemic heart disease and other diseases of the circulatory system: Secondary | ICD-10-CM | POA: Diagnosis not present

## 2018-03-31 DIAGNOSIS — Y92092 Bedroom in other non-institutional residence as the place of occurrence of the external cause: Secondary | ICD-10-CM | POA: Diagnosis not present

## 2018-03-31 DIAGNOSIS — R748 Abnormal levels of other serum enzymes: Secondary | ICD-10-CM

## 2018-03-31 DIAGNOSIS — S42309A Unspecified fracture of shaft of humerus, unspecified arm, initial encounter for closed fracture: Secondary | ICD-10-CM | POA: Diagnosis present

## 2018-03-31 DIAGNOSIS — M62838 Other muscle spasm: Secondary | ICD-10-CM | POA: Diagnosis present

## 2018-03-31 DIAGNOSIS — S42331A Displaced oblique fracture of shaft of humerus, right arm, initial encounter for closed fracture: Secondary | ICD-10-CM | POA: Diagnosis present

## 2018-03-31 DIAGNOSIS — F329 Major depressive disorder, single episode, unspecified: Secondary | ICD-10-CM | POA: Diagnosis present

## 2018-03-31 DIAGNOSIS — I10 Essential (primary) hypertension: Secondary | ICD-10-CM | POA: Diagnosis present

## 2018-03-31 DIAGNOSIS — W19XXXA Unspecified fall, initial encounter: Secondary | ICD-10-CM | POA: Diagnosis not present

## 2018-03-31 DIAGNOSIS — I480 Paroxysmal atrial fibrillation: Secondary | ICD-10-CM | POA: Diagnosis present

## 2018-03-31 DIAGNOSIS — T68XXXA Hypothermia, initial encounter: Secondary | ICD-10-CM | POA: Diagnosis present

## 2018-03-31 DIAGNOSIS — Z9049 Acquired absence of other specified parts of digestive tract: Secondary | ICD-10-CM | POA: Diagnosis not present

## 2018-03-31 DIAGNOSIS — Z955 Presence of coronary angioplasty implant and graft: Secondary | ICD-10-CM | POA: Diagnosis not present

## 2018-03-31 LAB — TYPE AND SCREEN
ABO/RH(D): O POS
Antibody Screen: NEGATIVE

## 2018-03-31 LAB — PROTIME-INR
INR: 1.13
PROTHROMBIN TIME: 14.5 s (ref 11.4–15.2)

## 2018-03-31 LAB — CK: CK TOTAL: 7030 U/L — AB (ref 38–234)

## 2018-03-31 LAB — MRSA PCR SCREENING: MRSA BY PCR: NEGATIVE

## 2018-03-31 LAB — APTT: APTT: 29 s (ref 24–36)

## 2018-03-31 LAB — ABO/RH: ABO/RH(D): O POS

## 2018-03-31 MED ORDER — DOXAZOSIN MESYLATE 8 MG PO TABS
8.0000 mg | ORAL_TABLET | Freq: Every day | ORAL | Status: DC
  Administered 2018-04-01 – 2018-04-04 (×4): 8 mg via ORAL
  Filled 2018-03-31: qty 1
  Filled 2018-03-31: qty 2
  Filled 2018-03-31: qty 1
  Filled 2018-03-31: qty 2
  Filled 2018-03-31: qty 1
  Filled 2018-03-31 (×3): qty 2
  Filled 2018-03-31: qty 1

## 2018-03-31 MED ORDER — QUINAPRIL HCL 10 MG PO TABS: 40.0000 mg | ORAL_TABLET | Freq: Every day | ORAL | Status: DC

## 2018-03-31 MED ORDER — LORAZEPAM 2 MG/ML IJ SOLN
1.0000 mg | Freq: Four times a day (QID) | INTRAMUSCULAR | Status: AC | PRN
  Administered 2018-03-31: 1 mg via INTRAVENOUS
  Filled 2018-03-31: qty 1

## 2018-03-31 MED ORDER — SODIUM CHLORIDE 0.9 % IV SOLN
INTRAVENOUS | Status: DC
  Administered 2018-03-31 (×2): via INTRAVENOUS
  Administered 2018-04-01: 1000 mL via INTRAVENOUS

## 2018-03-31 MED ORDER — METHOCARBAMOL 500 MG PO TABS
500.0000 mg | ORAL_TABLET | Freq: Three times a day (TID) | ORAL | Status: DC | PRN
  Administered 2018-04-01: 500 mg via ORAL
  Filled 2018-03-31: qty 1

## 2018-03-31 MED ORDER — FOLIC ACID 1 MG PO TABS
1.0000 mg | ORAL_TABLET | Freq: Every day | ORAL | Status: DC
  Administered 2018-04-01 – 2018-04-04 (×4): 1 mg via ORAL
  Filled 2018-03-31 (×4): qty 1

## 2018-03-31 MED ORDER — POLYVINYL ALCOHOL 1.4 % OP SOLN
1.0000 [drp] | Freq: Every day | OPHTHALMIC | Status: DC
  Administered 2018-03-31 – 2018-04-04 (×5): 1 [drp] via OPHTHALMIC
  Filled 2018-03-31: qty 15

## 2018-03-31 MED ORDER — LORAZEPAM 2 MG/ML IJ SOLN: 0.0000 mg | Freq: Two times a day (BID) | INTRAMUSCULAR | Status: AC

## 2018-03-31 MED ORDER — POLYETHYLENE GLYCOL 3350 17 G PO PACK: 17.0000 g | PACK | Freq: Every day | ORAL | Status: DC | PRN

## 2018-03-31 MED ORDER — SIMVASTATIN 5 MG PO TABS
5.0000 mg | ORAL_TABLET | Freq: Every evening | ORAL | Status: DC
  Administered 2018-03-31 – 2018-04-03 (×4): 5 mg via ORAL
  Filled 2018-03-31 (×4): qty 1

## 2018-03-31 MED ORDER — OXYCODONE-ACETAMINOPHEN 5-325 MG PO TABS
1.0000 | ORAL_TABLET | ORAL | Status: DC | PRN
  Administered 2018-04-04: 1 via ORAL
  Filled 2018-03-31: qty 1

## 2018-03-31 MED ORDER — OCUVITE-LUTEIN PO CAPS
2.0000 | ORAL_CAPSULE | Freq: Every day | ORAL | Status: DC
  Filled 2018-03-31: qty 2

## 2018-03-31 MED ORDER — METOPROLOL SUCCINATE ER 25 MG PO TB24
25.0000 mg | ORAL_TABLET | Freq: Every day | ORAL | Status: DC
  Administered 2018-04-01 – 2018-04-04 (×4): 25 mg via ORAL
  Filled 2018-03-31 (×4): qty 1

## 2018-03-31 MED ORDER — MORPHINE SULFATE (PF) 4 MG/ML IV SOLN: 1.0000 mg | INTRAVENOUS | Status: DC | PRN

## 2018-03-31 MED ORDER — LORAZEPAM 2 MG/ML IJ SOLN: 0.0000 mg | Freq: Four times a day (QID) | INTRAMUSCULAR | Status: AC

## 2018-03-31 MED ORDER — VITAMIN D 1000 UNITS PO TABS
1000.0000 [IU] | ORAL_TABLET | Freq: Every evening | ORAL | Status: DC
  Administered 2018-03-31 – 2018-04-03 (×2): 1000 [IU] via ORAL
  Filled 2018-03-31 (×2): qty 1

## 2018-03-31 MED ORDER — LEVALBUTEROL HCL 0.63 MG/3ML IN NEBU
0.6300 mg | INHALATION_SOLUTION | Freq: Three times a day (TID) | RESPIRATORY_TRACT | Status: DC | PRN
Start: 2018-03-31 — End: 2018-04-04

## 2018-03-31 MED ORDER — ONDANSETRON HCL 4 MG/2ML IJ SOLN: 4.0000 mg | Freq: Three times a day (TID) | INTRAMUSCULAR | Status: DC | PRN

## 2018-03-31 MED ORDER — ACETAMINOPHEN 325 MG PO TABS
650.0000 mg | ORAL_TABLET | Freq: Four times a day (QID) | ORAL | Status: DC | PRN
  Administered 2018-04-02: 650 mg via ORAL
  Filled 2018-03-31 (×2): qty 2

## 2018-03-31 MED ORDER — ADULT MULTIVITAMIN W/MINERALS CH: 1.0000 | ORAL_TABLET | Freq: Every day | ORAL | Status: DC

## 2018-03-31 MED ORDER — PROPYLENE GLYCOL 0.6 % OP SOLN: 1.0000 [drp] | Freq: Every day | OPHTHALMIC | Status: DC

## 2018-03-31 MED ORDER — LEVOTHYROXINE SODIUM 75 MCG PO TABS
75.0000 ug | ORAL_TABLET | Freq: Every day | ORAL | Status: DC
  Administered 2018-04-01 – 2018-04-04 (×4): 75 ug via ORAL
  Filled 2018-03-31 (×5): qty 1

## 2018-03-31 MED ORDER — PAROXETINE HCL 20 MG PO TABS
40.0000 mg | ORAL_TABLET | Freq: Every day | ORAL | Status: DC
  Administered 2018-04-01 – 2018-04-04 (×4): 40 mg via ORAL
  Filled 2018-03-31 (×5): qty 2

## 2018-03-31 MED ORDER — ZOLPIDEM TARTRATE 5 MG PO TABS: 5.0000 mg | ORAL_TABLET | Freq: Every evening | ORAL | Status: DC | PRN

## 2018-03-31 MED ORDER — HYDRALAZINE HCL 20 MG/ML IJ SOLN: 5.0000 mg | INTRAMUSCULAR | Status: DC | PRN

## 2018-03-31 MED ORDER — SODIUM CHLORIDE 0.9 % IV SOLN: INTRAVENOUS | Status: DC

## 2018-03-31 MED ORDER — TETANUS-DIPHTH-ACELL PERTUSSIS 5-2.5-18.5 LF-MCG/0.5 IM SUSP
0.5000 mL | Freq: Once | INTRAMUSCULAR | Status: AC
  Administered 2018-03-31: 0.5 mL via INTRAMUSCULAR
  Filled 2018-03-31: qty 0.5

## 2018-03-31 MED ORDER — LISINOPRIL 20 MG PO TABS
40.0000 mg | ORAL_TABLET | Freq: Every day | ORAL | Status: DC
  Administered 2018-03-31 – 2018-04-04 (×5): 40 mg via ORAL
  Filled 2018-03-31 (×5): qty 2

## 2018-03-31 MED ORDER — VITAMIN B-1 100 MG PO TABS
100.0000 mg | ORAL_TABLET | Freq: Every day | ORAL | Status: DC
  Administered 2018-04-01 – 2018-04-04 (×4): 100 mg via ORAL
  Filled 2018-03-31 (×4): qty 1

## 2018-03-31 MED ORDER — PANTOPRAZOLE SODIUM 40 MG PO TBEC
40.0000 mg | DELAYED_RELEASE_TABLET | Freq: Every day | ORAL | Status: DC
  Administered 2018-04-01 – 2018-04-04 (×4): 40 mg via ORAL
  Filled 2018-03-31 (×4): qty 1

## 2018-03-31 MED ORDER — AMLODIPINE BESYLATE 10 MG PO TABS
10.0000 mg | ORAL_TABLET | Freq: Every day | ORAL | Status: DC
  Administered 2018-04-01 – 2018-04-04 (×4): 10 mg via ORAL
  Filled 2018-03-31 (×4): qty 1

## 2018-03-31 MED ORDER — SODIUM CHLORIDE 0.9 % IV SOLN
INTRAVENOUS | Status: DC
Start: 2018-03-31 — End: 2018-03-31
  Administered 2018-03-31: 03:00:00 via INTRAVENOUS

## 2018-03-31 MED ORDER — LORAZEPAM 1 MG PO TABS: 1.0000 mg | ORAL_TABLET | Freq: Four times a day (QID) | ORAL | Status: AC | PRN

## 2018-03-31 MED ORDER — THIAMINE HCL 100 MG/ML IJ SOLN
100.0000 mg | Freq: Every day | INTRAMUSCULAR | Status: DC
  Filled 2018-03-31: qty 2

## 2018-03-31 NOTE — ED Notes (Signed)
Bair hugger removed due to pt now being normothermic. Axillary temp 98.4. Warm blankets applied to pt.

## 2018-03-31 NOTE — ED Provider Notes (Signed)
[facility] EMERGENCY DEPARTMENT Provider Note   CSN: [id number] Arrival date & time: 03/30/18  2140     History   Chief Complaint Chief Complaint  Patient presents with  . Alcohol Intoxication    HPI Christina Ortiz is a 84 y.o. female.  HPI 84 year old Caucasian female past medical history significant for CKD, hypertension, hyperlipidemia, atrial fibirillation currently on xarelto, hypokalemia presents to the emergency department today for evaluation of right arm injury after mechanical fall.  Per family at bedside patient lives at home and ambulates without a walker at baseline.  This evening she fell out of the bed.  She did press her life alert button however her family members did not hear the alert.  They did respond to patient 2 hours later and she was found on the ground with her right arm pinned behind her head against the bed.  Patient's arm had been there for 2 hours.  Patient complains of right arm pain.  She also reports decreased sensation of the right arm.  Denies any other pain including headache, neck pain, back pain, chest pain, abdominal pain.  Family does note that patient did have a bottle of vodka next year that was empty.  Patient does have history of alcohol abuse including vodka.  Pt denies any fever, chill, ha, vision changes, lightheadedness, dizziness, congestion, neck pain, cp, sob, cough, abd pain, n/v/d, urinary symptoms, change in bowel habits, melena, hematochezia, lower extremity paresthesias.  Past Medical History:  Diagnosis Date  . Acute bronchitis 10/2017  . Arthritis   . CKD (chronic kidney disease)    Xarelto dose is 15 mg QD  . Coronary artery disease    a. s/p PCI/stenting to RCA and LAD ([facility] in [place]) in 2006; b. Nuc 1/15: No ischemia, EF 70%  . Depression   . Gastritis   . History of cholecystectomy   . History of echocardiogram    a. Echo 1/15: Moderate LVH, EF 55-60%, impaired relaxation, mild AS  .  Hyperlipidemia   . Hypertension   . Hypokalemia   . Mobitz type 1 second degree atrioventricular block    a. Event Monitor 3/15: NSR, first-degree AV block, second-degree AB block - Mobitz 1, PACs, NSVT (5 beats)  . PAF (paroxysmal atrial fibrillation) (HCC)   . Thyroid disease     Patient Active Problem List   Diagnosis Date Noted  . Fall 03/31/2018  . Humerus shaft fracture-right 03/31/2018  . Rhabdomyolysis 03/31/2018  . Hypothermia 03/31/2018  . Humerus fracture 03/31/2018  . Healthcare maintenance 01/17/2018  . Nocturia 01/17/2018  . Macrocytosis 11/22/2017  . Acute bronchitis due to respiratory syncytial virus (RSV) 11/09/2017  . Acute respiratory failure with hypoxia (HCC) 11/09/2017  . Hypertension 11/09/2017  . PAF (paroxysmal atrial fibrillation) (HCC) 11/09/2017  . Coronary artery disease 11/09/2017  . CKD (chronic kidney disease) stage 2, GFR 60-89 ml/min 11/09/2017  . Hypothyroidism 11/09/2017  . Dyspnea 11/08/2017  . Upper respiratory tract infection 11/08/2017  . Elevated serum creatinine 06/21/2017  . Edema of both ankles 06/21/2017  . Diarrhea 05/25/2017  . Hypokalemia 05/25/2017  . Alcohol abuse 05/25/2017  . Depression, recurrent (HCC) 03/30/2017  . Lumbago 03/30/2017  . Influenza with respiratory manifestation 01/22/2017  . Coronary artery disease due to lipid rich plaque 04/04/2015  . Essential hypertension 04/04/2015  . Hyperlipidemia 04/04/2015  . Former smoker 04/04/2015  . Chronic anticoagulation 04/04/2015    Past Surgical History:  Procedure Laterality Date  . BREAST LUMPECTOMY  Left   . CARDIAC CATHETERIZATION    . CATARACT EXTRACTION Bilateral   . CHOLECYSTECTOMY    . DILATION AND CURETTAGE OF UTERUS    . EYE SURGERY       OB History   None      Home Medications    Prior to Admission medications   Medication Sig Start Date End Date Taking? Authorizing Provider  amLODipine (NORVASC) 10 MG tablet Take 1 tablet (10 mg total) by  mouth daily. 09/06/17  Yes Danford, Orpha Bur D, NP  cholecalciferol (VITAMIN D) 1000 units tablet Take 1,000 Units by mouth every evening.   Yes [provider]  doxazosin (CARDURA) 8 MG tablet TAKE 1 TABLET (8 MG TOTAL) BY MOUTH DAILY. 02/13/18  Yes Danford, Jinny Blossom, NP  levalbuterol (XOPENEX HFA) 45 MCG/ACT inhaler Inhale 2 puffs into the lungs every 8 (eight) hours as needed for wheezing or shortness of breath. 11/22/17 11/22/18 Yes Danford, Orpha Bur D, NP  levalbuterol (XOPENEX) 0.63 MG/3ML nebulizer solution INHALE THE CONTENTS OF 1 VIAL VIA NEBULIZER EVERY 8 HOURS AS NEEDED FOR WHEEZING OR SHORTNESS OF BREATH. 12/06/17  Yes Danford, Orpha Bur D, NP  levothyroxine (SYNTHROID, LEVOTHROID) 75 MCG tablet Take 1 tablet (75 mcg total) by mouth daily before breakfast. 01/25/18  Yes Danford, [name], NP  metoprolol succinate (TOPROL-XL) 25 MG 24 hr tablet TAKE 1 TABLET BY MOUTH DAILY. 02/06/18  Yes Jake Bathe, MD  Multiple Vitamins-Minerals (PRESERVISION AREDS 2 PO) Take 1 tablet by mouth daily.   Yes [provider]  omeprazole (PRILOSEC) 20 MG capsule Take 1 capsule (20 mg total) by mouth daily as needed (heartburn). Patient taking differently: Take 20 mg by mouth every evening.  11/16/17  Yes Danford, Orpha Bur D, NP  PARoxetine (PAXIL) 40 MG tablet Take 1 tablet (40 mg total) by mouth daily. 09/06/17  Yes Danford, Orpha Bur D, NP  potassium chloride (K-DUR) 10 MEQ tablet Take 1 tablet (10 mEq total) by mouth daily. 09/06/17  Yes Danford, Orpha Bur D, NP  Propylene Glycol (SYSTANE BALANCE) 0.6 % SOLN Place 1 drop into both eyes daily.   Yes [provider]  quinapril (ACCUPRIL) 40 MG tablet TAKE 1 TABLET (40 MG TOTAL) BY MOUTH DAILY. 02/13/18  Yes Danford, Orpha Bur D, NP  simvastatin (ZOCOR) 5 MG tablet TAKE 1 TABLET BY MOUTH EVERY EVENING. 02/08/18  Yes Danford, [name], NP  XARELTO 15 MG TABS tablet TAKE 1 TABLET (15 MG TOTAL) BY MOUTH DAILY WITH SUPPER. 11/08/17  Yes Jake Bathe, MD  benzonatate  (TESSALON) 100 MG capsule Take 1 capsule (100 mg total) 2 (two) times daily as needed by mouth for cough. Patient not taking: Reported on 03/31/2018 11/08/17   William Hamburger D, NP  fluticasone (FLONASE) 50 MCG/ACT nasal spray Place 2 sprays daily into both nostrils. Patient not taking: Reported on 03/31/2018 11/08/17   William Hamburger D, NP  potassium chloride (K-DUR) 10 MEQ tablet TAKE 1 TABLET (10 MEQ TOTAL) BY MOUTH DAILY. Patient not taking: Reported on 03/31/2018 01/26/18   William Hamburger D, NP  simvastatin (ZOCOR) 5 MG tablet Take 1 tablet (5 mg total) by mouth every evening. Patient not taking: Reported on 03/31/2018 09/06/17   Julaine Fusi, NP    Family History Family History  Problem Relation Age of Onset  . Heart disease Mother   . Heart attack Mother   . Lung cancer Father   . Cancer Father        lung    Social  History Social History   Tobacco Use  . Smoking status: Former Smoker    Packs/day: 0.50    Years: 67.00    Pack years: 33.50    Types: Cigarettes    Last attempt to quit: 10/25/2015    Years since quitting: 2.4  . Smokeless tobacco: Never Used  . Tobacco comment: quit 2015  Substance Use Topics  . Alcohol use: Yes    Alcohol/week: 6.0 oz    Types: 10 Shots of liquor per week  . Drug use: No     Allergies   Tape   Review of Systems Review of Systems  All other systems reviewed and are negative.    Physical Exam Updated Vital Signs BP (!) 149/71   Pulse 64   Temp (!) 96.4 F (35.8 C) Comment: bair hugger  Resp 18   Ht 5\' 5"  (1.651 m)   Wt 72.6 kg (160 lb)   SpO2 96%   BMI 26.63 kg/m   Physical Exam  Constitutional: She is oriented to person, place, and time. She appears well-developed and well-nourished.  Non-toxic appearance. No distress.  HENT:  Head: Normocephalic and atraumatic.  Nose: Nose normal.  Mouth/Throat: Oropharynx is clear and moist.  Eyes: Pupils are equal, round, and reactive to light. Conjunctivae are normal. Right eye  exhibits no discharge. Left eye exhibits no discharge.  Neck: Normal range of motion. Neck supple.  No c spine midline tenderness. No paraspinal tenderness. No deformities or step offs noted. Full ROM. Supple. No nuchal rigidity.    Cardiovascular: Normal rate, regular rhythm, normal heart sounds and intact distal pulses. Exam reveals no friction rub.  No murmur heard. Pulmonary/Chest: Effort normal and breath sounds normal. No stridor. No respiratory distress. She has no wheezes. She has no rales. She exhibits no tenderness.  Abdominal: Soft. Bowel sounds are normal. There is no tenderness. There is no rebound and no guarding.  Musculoskeletal: Normal range of motion. She exhibits no tenderness.  Patient has limited range of motion of the right arm secondary to pain.  She also has abrasions noted to the right arm with edema.  Patient sensation is decreased on the right arm from the shoulder to the fingertips.  She does have mobility of the right fingertips.  Compartments are soft.  Radial pulses are 1+ bilaterally.  Cap refill is normal.  Grip strength is decreased.  No midline T spine or L spine tenderness. No deformities or step offs noted. Full ROM. Pelvis is stable.  Full range of motion of all joints of the lower extremity without pain.  DP pulses are 2+ bilaterally.  Sensation intact.  Lymphadenopathy:    She has no cervical adenopathy.  Neurological: She is alert and oriented to person, place, and time.  Decreased sensation of the right arm from the shoulder to the right fingertips.  Grip strength decreased.  Skin: Skin is warm and dry. Capillary refill takes less than 2 seconds.  Psychiatric: Her behavior is normal. Judgment and thought content normal.  Ecchymosis noted to the right forearm with abrasions and bleeding controlled.  Nursing note and vitals reviewed.    ED Treatments / Results  Labs (all labs ordered are listed, but only abnormal results are displayed) Labs Reviewed   CBC - Abnormal; Notable for the following components:      Result Value   WBC 11.3 (*)    All other components within normal limits  BASIC METABOLIC PANEL - Abnormal; Notable for the following components:  CO2 18 (*)    Glucose, Bld 110 (*)    BUN 29 (*)    GFR calc non Af Amer 50 (*)    GFR calc Af Amer 57 (*)    All other components within normal limits  ETHANOL - Abnormal; Notable for the following components:   Alcohol, Ethyl (B) 159 (*)    All other components within normal limits  CK - Abnormal; Notable for the following components:   Total CK 3,046 (*)    All other components within normal limits  CK  PROTIME-INR  APTT  TYPE AND SCREEN    EKG EKG Interpretation  Date/Time:  Thursday March 30 2018 22:00:58 EDT Ventricular Rate:  54 PR Interval:  294 QRS Duration: 80 QT Interval:  470 QTC Calculation: 445 R Axis:   -157 Text Interpretation:  Sinus bradycardia with 1st degree A-V block Indeterminate axis Low voltage QRS Cannot rule out Anteroseptal infarct , age undetermined Abnormal ECG Confirmed by Rochele Raring [id number]) on 03/31/2018 1:18:22 AM   Radiology Dg Shoulder Right  Result Date: 03/31/2018 CLINICAL DATA:  Pain EXAM: RIGHT SHOULDER - 2+ VIEW COMPARISON:  11/08/2015 FINDINGS: Similar appearance of slightly widened AC joint. Old fracture deformity involving the proximal to midshaft of the humerus. No definite acute abnormality is seen. IMPRESSION: 1. Old fracture deformity involving the shaft of the humerus. 2. No definite acute osseous abnormality is seen. Electronically Signed   By: Jasmine Pang M.D.   On: 03/31/2018 02:21   Dg Forearm Right  Result Date: 03/30/2018 CLINICAL DATA:  Right arm pain after fall EXAM: RIGHT FOREARM - 2 VIEW COMPARISON:  Elbow radiograph 11/08/2015 FINDINGS: No acute displaced fracture or malalignment. Soft tissues are unremarkable. IMPRESSION: No acute osseous abnormality Electronically Signed   By: Jasmine Pang M.D.   On:  03/30/2018 22:44   Ct Head Wo Contrast  Result Date: 03/30/2018 CLINICAL DATA:  84 y/o  F; found on floor. EXAM: CT HEAD WITHOUT CONTRAST CT CERVICAL SPINE WITHOUT CONTRAST TECHNIQUE: Multidetector CT imaging of the head and cervical spine was performed following the standard protocol without intravenous contrast. Multiplanar CT image reconstructions of the cervical spine were also generated. COMPARISON:  05/22/2017 CT head FINDINGS: CT HEAD FINDINGS Brain: Stable very small chronic infarction within the right cerebellar hemisphere in tiny chronic lacunar infarcts within the basal ganglia. Stable chronic microvascular ischemic changes and parenchymal volume loss of the brain. No evidence for acute stroke, hemorrhage, focal mass effect of the brain, hydrocephalus, extra-axial collection, or effacement of basilar cisterns. Vascular: Calcific atherosclerosis of carotid siphons. Skull: Normal. Negative for fracture or focal lesion. Sinuses/Orbits: Stable left mastoid tip sclerosis. Moderate left and mild right maxillary sinus mucosal thickening. Aerosolized secretions in the right sphenoid sinus. Bilateral intra-ocular lens replacement. Other: None. CT CERVICAL SPINE FINDINGS Motion artifact greatest at the C5-C7 levels. Alignment: Straightening of cervical lordosis. C7-T1 grade 1 anterolisthesis secondary to facet hypertrophy. Skull base and vertebrae: No acute fracture. No primary bone lesion or focal pathologic process. Soft tissues and spinal canal: No prevertebral fluid or swelling. No visible canal hematoma. Disc levels: Cervical spondylosis with advanced discogenic degenerative changes at the C5-C7 levels and facet hypertrophy which is severe on the left. Left-sided uncovertebral hypertrophy and facet hypertrophy results in bony foraminal stenosis at the C3 through C7 levels. There is right-sided bony foraminal stenosis primarily at C6-7. No high-grade canal stenosis. Upper chest: Negative. Other: Calcific  atherosclerosis of carotid siphons. IMPRESSION: 1. No acute intracranial abnormality or  calvarial fracture. 2. Motion degraded cervical spine CT. No acute fracture or malalignment identified. 3. Stable chronic microvascular ischemic changes, parenchymal volume loss, and small chronic infarcts in the brain. 4. Paranasal sinus disease with aerosolized secretions in the right sphenoid sinus which may represent acute sinusitis. 5. Cervical spondylosis with disc and facet degenerative changes greatest at C5-C7 levels. No high-grade bony canal stenosis. Electronically Signed   By: Mitzi Hansen M.D.   On: 03/30/2018 22:53   Ct Cervical Spine Wo Contrast  Result Date: 03/30/2018 CLINICAL DATA:  84 y/o  F; found on floor. EXAM: CT HEAD WITHOUT CONTRAST CT CERVICAL SPINE WITHOUT CONTRAST TECHNIQUE: Multidetector CT imaging of the head and cervical spine was performed following the standard protocol without intravenous contrast. Multiplanar CT image reconstructions of the cervical spine were also generated. COMPARISON:  05/22/2017 CT head FINDINGS: CT HEAD FINDINGS Brain: Stable very small chronic infarction within the right cerebellar hemisphere in tiny chronic lacunar infarcts within the basal ganglia. Stable chronic microvascular ischemic changes and parenchymal volume loss of the brain. No evidence for acute stroke, hemorrhage, focal mass effect of the brain, hydrocephalus, extra-axial collection, or effacement of basilar cisterns. Vascular: Calcific atherosclerosis of carotid siphons. Skull: Normal. Negative for fracture or focal lesion. Sinuses/Orbits: Stable left mastoid tip sclerosis. Moderate left and mild right maxillary sinus mucosal thickening. Aerosolized secretions in the right sphenoid sinus. Bilateral intra-ocular lens replacement. Other: None. CT CERVICAL SPINE FINDINGS Motion artifact greatest at the C5-C7 levels. Alignment: Straightening of cervical lordosis. C7-T1 grade 1 anterolisthesis  secondary to facet hypertrophy. Skull base and vertebrae: No acute fracture. No primary bone lesion or focal pathologic process. Soft tissues and spinal canal: No prevertebral fluid or swelling. No visible canal hematoma. Disc levels: Cervical spondylosis with advanced discogenic degenerative changes at the C5-C7 levels and facet hypertrophy which is severe on the left. Left-sided uncovertebral hypertrophy and facet hypertrophy results in bony foraminal stenosis at the C3 through C7 levels. There is right-sided bony foraminal stenosis primarily at C6-7. No high-grade canal stenosis. Upper chest: Negative. Other: Calcific atherosclerosis of carotid siphons. IMPRESSION: 1. No acute intracranial abnormality or calvarial fracture. 2. Motion degraded cervical spine CT. No acute fracture or malalignment identified. 3. Stable chronic microvascular ischemic changes, parenchymal volume loss, and small chronic infarcts in the brain. 4. Paranasal sinus disease with aerosolized secretions in the right sphenoid sinus which may represent acute sinusitis. 5. Cervical spondylosis with disc and facet degenerative changes greatest at C5-C7 levels. No high-grade bony canal stenosis. Electronically Signed   By: Mitzi Hansen M.D.   On: 03/30/2018 22:53   Dg Humerus Right  Result Date: 03/31/2018 CLINICAL DATA:  Fall EXAM: RIGHT HUMERUS - 2+ VIEW COMPARISON:  None. FINDINGS: There is a mildly displaced oblique fracture of the midshaft of the right humerus. Direction of displacement is impossible to determine because of positioning. IMPRESSION: Mildly displaced oblique fracture of the humerus. Electronically Signed   By: Deatra Robinson M.D.   On: 03/31/2018 02:17   Dg Hand Complete Right  Result Date: 03/31/2018 CLINICAL DATA:  Fall EXAM: RIGHT HAND - COMPLETE 3+ VIEW COMPARISON:  None. FINDINGS: Mid acute fracture or dislocation of the right hand. The bones are osteopenic. Mild osteoarthrosis, predominantly of the first  carpometacarpal joint and interphalangeal joints of the fifth digit. IMPRESSION: No fracture or dislocation of the right hand. Electronically Signed   By: Deatra Robinson M.D.   On: 03/31/2018 02:19    Procedures Procedures (including critical care time)  Medications Ordered in ED Medications  Tdap (BOOSTRIX) injection 0.5 mL (has no administration in time range)  amLODipine (NORVASC) tablet 10 mg (has no administration in time range)  cholecalciferol (VITAMIN D) tablet 1,000 Units (has no administration in time range)  doxazosin (CARDURA) tablet 8 mg (has no administration in time range)  levalbuterol (XOPENEX) nebulizer solution 0.63 mg (has no administration in time range)  levothyroxine (SYNTHROID, LEVOTHROID) tablet 75 mcg (has no administration in time range)  metoprolol succinate (TOPROL-XL) 24 hr tablet 25 mg (has no administration in time range)  PRESERVISION AREDS 2 CAPS 2 capsule (has no administration in time range)  pantoprazole (PROTONIX) EC tablet 40 mg (has no administration in time range)  PARoxetine (PAXIL) tablet 40 mg (has no administration in time range)  Propylene Glycol 0.6 % SOLN 1 drop (has no administration in time range)  quinapril (ACCUPRIL) tablet 40 mg (has no administration in time range)  simvastatin (ZOCOR) tablet 5 mg (has no administration in time range)  0.9 %  sodium chloride infusion (has no administration in time range)  oxyCODONE-acetaminophen (PERCOCET/ROXICET) 5-325 MG per tablet 1 tablet (has no administration in time range)  morphine 4 MG/ML injection 1 mg (has no administration in time range)  methocarbamol (ROBAXIN) tablet 500 mg (has no administration in time range)  LORazepam (ATIVAN) tablet 1 mg (has no administration in time range)    Or  LORazepam (ATIVAN) injection 1 mg (has no administration in time range)  thiamine (VITAMIN B-1) tablet 100 mg (has no administration in time range)    Or  thiamine (B-1) injection 100 mg (has no  administration in time range)  folic acid (FOLVITE) tablet 1 mg (has no administration in time range)  multivitamin with minerals tablet 1 tablet (has no administration in time range)  LORazepam (ATIVAN) injection 0-4 mg (0 mg Intravenous Not Given 03/31/18 0331)    Followed by  LORazepam (ATIVAN) injection 0-4 mg (has no administration in time range)  polyethylene glycol (MIRALAX / GLYCOLAX) packet 17 g (has no administration in time range)     Initial Impression / Assessment and Plan / ED Course  I have reviewed the triage vital signs and the nursing notes.  Pertinent labs & imaging results that were available during my care of the patient were reviewed by me and considered in my medical decision making (see chart for details).     Patient presents to the ED for alcohol intoxication and right arm pain after mechanical injury.  Patient was noted to be down on the ground for 2 hours with her arm pinned behind her.  Patient also had EtOH on board.  Patient was hypothermic on arrival at 94.3.  Bear hugger was applied.  No tachycardia noted.  Blood pressure is stable and no hypotension.  Patient is not hypoxic.  Heart regular rate and rhythm.  Lungs clear to auscultation bilaterally.  No focal abdominal pain to palpation.  No focal neuro deficit on exam.  Patient does have limited range of motion and decreased sensation of the right arm with ecchymosis and abrasions noted.  Decreased grip strength secondary to pain.  Skin compartments are soft however edema is noted.  Radial pulses are 1+ bilaterally.  Lab work reveals a leukocytosis of 11,000.  Elect lites are reassuring.  Normal kidney function.  Ethanol is 159.  Her CK is 3046.   CT imaging of head and neck are unremarkable.  C-collar was removed.  X-rays of the right arm do reveal a  oblique displaced humeral fracture.  Given patient's decreased sensation and range of motion of the right arm feel the patient likely has nerve injury from  prolonged immobilization.  She is vascularly intact.  No signs of a compartment syndrome at this time.  Patient's arm was elevated and ice was applied.  Patient's temperature has improved with bear hugger and warm blankets.  Given her elevated CK with normal kidney function patient will need to be observed to make sure this is improving.  Gentle hydration given.  No signs of compartment syndrome at this time however patient is high risk given that she is on blood thinners with significant swelling and decreased sensation.  This will need to be monitored.  I did speak with a Engineer, production on call concerning patient at the request of Dr. Clyde Lundborg.  They are aware of patient and have no further recommendations at this time.  Will see patient in consultation.  She was seen by hospital service who agrees to admission and will place admission orders.  Patient remains hemodynamic stable this time.  Is seen in my attending Dr. Elesa Massed who is agreed with the above plan.  Final Clinical Impressions(s) / ED Diagnoses   Final diagnoses:  Elevated CK  Closed fracture of shaft of right humerus, unspecified fracture morphology, initial encounter    ED Discharge Orders    None       Rise Mu, PA-C 03/31/18 0455    Ward, Layla Maw, DO 03/31/18 0630

## 2018-03-31 NOTE — Care Management Note (Signed)
Case Management Note  Patient Details  Name: Christina Ortiz MRN: [id number] Date of Birth: [birth year]  Subjective/Objective:                  84 y.o. female with medical history significant of Alcohol abuse, hypertension, hyperlipidemia, GERD, hypothyroidism, depression,atrial fibrillation, CAD, s/p of stent placement, prostate cancer, who presents with fall and right arm injury. From home alone.  Action/Plan: Admit status INPATIENT (CLOSED FX OF SHAFT OF RIGHT HUMERUS); anticipate discharge HOME WITH HOME HEALTH VS SNF.   Expected Discharge Date:  (unknown)               Expected Discharge Plan:  Home w Home Health Services  In-House Referral:  Clinical Social Work  Discharge planning Services  CM Consult  Status of Service:  In process, will continue to follow  If discussed at Long Length of Stay Meetings, dates discussed:    Additional Comments:  Oletta Cohn, RN 03/31/2018, 11:38 AM

## 2018-03-31 NOTE — ED Notes (Signed)
Admitting at the bedside updating family  °

## 2018-03-31 NOTE — ED Notes (Signed)
Ortho to come place splint and immobilizer

## 2018-03-31 NOTE — H&P (Signed)
History and Physical    [name] CBS:[id number] DOB: 11-28-1929 DOA: 03/31/2018  Referring MD/NP/PA:   PCP: Julaine Fusi, NP   Patient coming from:  The patient is coming from home.  At baseline, pt is independent for most of ADL.  Chief Complaint: fall and right arm injury  HPI: Christina Ortiz is a 84 y.o. female with medical history significant of Alcohol abuse, hypertension, hyperlipidemia, GERD, hypothyroidism, depression,atrial fibrillation on Xarelto, CAD, s/p of stent placement, prostate cancer (s/p of lumpectomy), who presents with fall and right arm injury.  Per report, pt fell out the bed this evening. She did press her life alert button, however her family members did not hear the alert. 2 hours later, pt was found on the ground with her right arm pinned behind her head against the bed. She has bruise in left forearm. She has pain in left whole arm. She also reports decreased sensation of the right arm. She is not sure if she has LOC. Per family, there is bottle of vodka next her that was empty. Patient denies chest pain, shortness breath, cough, fever or chills. No nausea, vomiting, diarrhea, abdominal pain, symptoms of UTI.  ED Course: pt was found to have CK 3046, alcohol 159, WBC 11.3, creatinine normal, hyperthermia with temperature 94.2, bradycardia, oxygen saturation 95% on room air,creatinine normal. CT of the head is negative for acute intracranial abnormalities. CT of C-spine showed degenerative disc. X-ray of the right forearm, right hand are negative. X-ray of her right shoulder showed old fracture of humerus shaft. X-ray of right humerus showed displaced oblique fracture of midshaft in humerus. Pt is admitted to stepdown as inpatient. Orthopedic surgeon was consulted by EDP.  Review of Systems:   General: no fevers, chills, no body weight gain, has fatigue HEENT: no blurry vision, hearing changes or sore throat Respiratory: no dyspnea, coughing, wheezing CV:  no chest pain, no palpitations GI: no nausea, vomiting, abdominal pain, diarrhea, constipation GU: no dysuria, burning on urination, increased urinary frequency, hematuria  Ext: no leg edema Neuro: no unilateral weakness, numbness, or tingling, no vision change or hearing loss. Had fall.  Skin: has bruises in R forearm. MSK: has pain in right upper arm and forearm. Heme: No easy bruising.  Travel history: No recent long distant travel.  Allergy:  Allergies  Allergen Reactions  . Tape Other (See Comments)    Patient's skin is thin and will TEAR AND BRUISE EASILY    Past Medical History:  Diagnosis Date  . Acute bronchitis 10/2017  . Arthritis   . CKD (chronic kidney disease)    Xarelto dose is 15 mg QD  . Coronary artery disease    a. s/p PCI/stenting to RCA and LAD ([facility] in [place]) in 2006; b. Nuc 1/15: No ischemia, EF 70%  . Depression   . Gastritis   . History of cholecystectomy   . History of echocardiogram    a. Echo 1/15: Moderate LVH, EF 55-60%, impaired relaxation, mild AS  . Hyperlipidemia   . Hypertension   . Hypokalemia   . Mobitz type 1 second degree atrioventricular block    a. Event Monitor 3/15: NSR, first-degree AV block, second-degree AB block - Mobitz 1, PACs, NSVT (5 beats)  . PAF (paroxysmal atrial fibrillation) (HCC)   . Thyroid disease     Past Surgical History:  Procedure Laterality Date  . BREAST LUMPECTOMY Left   . CARDIAC CATHETERIZATION    . CATARACT EXTRACTION Bilateral   .  CHOLECYSTECTOMY    . DILATION AND CURETTAGE OF UTERUS    . EYE SURGERY      Social History:  reports that she quit smoking about 2 years ago. Her smoking use included cigarettes. She has a 33.50 pack-year smoking history. She has never used smokeless tobacco. She reports that she drinks about 6.0 oz of alcohol per week. She reports that she does not use drugs.  Family History:  Family History  Problem Relation Age of Onset  . Heart disease Mother   . Heart  attack Mother   . Lung cancer Father   . Cancer Father        lung     Prior to Admission medications   Medication Sig Start Date End Date Taking? Authorizing Provider  amLODipine (NORVASC) 10 MG tablet Take 1 tablet (10 mg total) by mouth daily. 09/06/17  Yes Danford, Orpha Bur D, NP  cholecalciferol (VITAMIN D) 1000 units tablet Take 1,000 Units by mouth every evening.   Yes [provider]  doxazosin (CARDURA) 8 MG tablet TAKE 1 TABLET (8 MG TOTAL) BY MOUTH DAILY. 02/13/18  Yes Danford, Jinny Blossom, NP  levalbuterol (XOPENEX HFA) 45 MCG/ACT inhaler Inhale 2 puffs into the lungs every 8 (eight) hours as needed for wheezing or shortness of breath. 11/22/17 11/22/18 Yes Danford, Orpha Bur D, NP  levalbuterol (XOPENEX) 0.63 MG/3ML nebulizer solution INHALE THE CONTENTS OF 1 VIAL VIA NEBULIZER EVERY 8 HOURS AS NEEDED FOR WHEEZING OR SHORTNESS OF BREATH. 12/06/17  Yes Danford, Orpha Bur D, NP  levothyroxine (SYNTHROID, LEVOTHROID) 75 MCG tablet Take 1 tablet (75 mcg total) by mouth daily before breakfast. 01/25/18  Yes Danford, [name], NP  metoprolol succinate (TOPROL-XL) 25 MG 24 hr tablet TAKE 1 TABLET BY MOUTH DAILY. 02/06/18  Yes Jake Bathe, MD  Multiple Vitamins-Minerals (PRESERVISION AREDS 2 PO) Take 1 tablet by mouth daily.   Yes [provider]  omeprazole (PRILOSEC) 20 MG capsule Take 1 capsule (20 mg total) by mouth daily as needed (heartburn). Patient taking differently: Take 20 mg by mouth every evening.  11/16/17  Yes Danford, Orpha Bur D, NP  PARoxetine (PAXIL) 40 MG tablet Take 1 tablet (40 mg total) by mouth daily. 09/06/17  Yes Danford, Orpha Bur D, NP  potassium chloride (K-DUR) 10 MEQ tablet Take 1 tablet (10 mEq total) by mouth daily. 09/06/17  Yes Danford, Orpha Bur D, NP  Propylene Glycol (SYSTANE BALANCE) 0.6 % SOLN Place 1 drop into both eyes daily.   Yes [provider]  quinapril (ACCUPRIL) 40 MG tablet TAKE 1 TABLET (40 MG TOTAL) BY MOUTH DAILY. 02/13/18  Yes Danford, Orpha Bur D, NP    simvastatin (ZOCOR) 5 MG tablet TAKE 1 TABLET BY MOUTH EVERY EVENING. 02/08/18  Yes Danford, [name], NP  XARELTO 15 MG TABS tablet TAKE 1 TABLET (15 MG TOTAL) BY MOUTH DAILY WITH SUPPER. 11/08/17  Yes Jake Bathe, MD  benzonatate (TESSALON) 100 MG capsule Take 1 capsule (100 mg total) 2 (two) times daily as needed by mouth for cough. Patient not taking: Reported on 03/31/2018 11/08/17   William Hamburger D, NP  fluticasone (FLONASE) 50 MCG/ACT nasal spray Place 2 sprays daily into both nostrils. Patient not taking: Reported on 03/31/2018 11/08/17   William Hamburger D, NP  potassium chloride (K-DUR) 10 MEQ tablet TAKE 1 TABLET (10 MEQ TOTAL) BY MOUTH DAILY. Patient not taking: Reported on 03/31/2018 01/26/18   William Hamburger D, NP  simvastatin (ZOCOR) 5 MG tablet Take 1 tablet (5 mg  total) by mouth every evening. Patient not taking: Reported on 03/31/2018 09/06/17   William Hamburger D, NP    Physical Exam: Vitals:   03/31/18 0500 03/31/18 0515 03/31/18 0530 03/31/18 0542  BP: (!) 148/73 (!) 146/68 (!) 122/111   Pulse: 75 75 63   Resp:      Temp:    (!) 97.5 F (36.4 C)  TempSrc:    Axillary  SpO2: 96% 96% 95%   Weight:      Height:       General: Not in acute distress HEENT:       Eyes: PERRL, EOMI, no scleral icterus.       ENT: No discharge from the ears and nose, no pharynx injection, no tonsillar enlargement.        Neck: No JVD, no bruit, no mass felt. Heme: No neck lymph node enlargement. Cardiac: S1/S2, RRR, No murmurs, No gallops or rubs. Respiratory:  No rales, wheezing, rhonchi or rubs. GI: Soft, nondistended, nontender, no rebound pain, no organomegaly, BS present. GU: No hematuria Ext: No pitting leg edema bilaterally. 2+DP/PT pulse bilaterally. Good radial pulse bilaterally. Musculoskeletal: has tenderness in right upper arm and forearm. Skin: has bruise in R forearm Neuro: Alert, oriented X3, cranial nerves II-XII grossly intact, moves all extremities normally Psych: Patient is not  psychotic, no suicidal or hemocidal ideation.  Labs on Admission: I have personally reviewed following labs and imaging studies  CBC: Recent Labs  Lab 03/30/18 2159  WBC 11.3*  HGB 12.7  HCT 39.9  MCV 99.3  PLT 204   Basic Metabolic Panel: Recent Labs  Lab 03/30/18 2159  NA 141  K 3.9  CL 108  CO2 18*  GLUCOSE 110*  BUN 29*  CREATININE 1.00  CALCIUM 8.9   GFR: Estimated Creatinine Clearance: 40.3 mL/min (by C-G formula based on SCr of 1 mg/dL). Liver Function Tests: No results for input(s): AST, ALT, ALKPHOS, BILITOT, PROT, ALBUMIN in the last 168 hours. No results for input(s): LIPASE, AMYLASE in the last 168 hours. No results for input(s): AMMONIA in the last 168 hours. Coagulation Profile: No results for input(s): INR, PROTIME in the last 168 hours. Cardiac Enzymes: Recent Labs  Lab 03/30/18 2159  CKTOTAL 3,046*   BNP (last 3 results) No results for input(s): PROBNP in the last 8760 hours. HbA1C: No results for input(s): HGBA1C in the last 72 hours. CBG: No results for input(s): GLUCAP in the last 168 hours. Lipid Profile: No results for input(s): CHOL, HDL, LDLCALC, TRIG, CHOLHDL, LDLDIRECT in the last 72 hours. Thyroid Function Tests: No results for input(s): TSH, T4TOTAL, FREET4, T3FREE, THYROIDAB in the last 72 hours. Anemia Panel: No results for input(s): VITAMINB12, FOLATE, FERRITIN, TIBC, IRON, RETICCTPCT in the last 72 hours. Urine analysis:    Component Value Date/Time   COLORURINE YELLOW 05/23/2017 0125   APPEARANCEUR HAZY (A) 05/23/2017 0125   LABSPEC 1.006 05/23/2017 0125   PHURINE 7.0 05/23/2017 0125   GLUCOSEU NEGATIVE 05/23/2017 0125   HGBUR SMALL (A) 05/23/2017 0125   BILIRUBINUR NEGATIVE 05/23/2017 0125   KETONESUR NEGATIVE 05/23/2017 0125   PROTEINUR 30 (A) 05/23/2017 0125   UROBILINOGEN 1.0 04/12/2017 1157   NITRITE NEGATIVE 05/23/2017 0125   LEUKOCYTESUR LARGE (A) 05/23/2017 0125   Sepsis  Labs: @LABRCNTIP (procalcitonin:4,lacticidven:4) )No results found for this or any previous visit (from the past 240 hour(s)).   Radiological Exams on Admission: Dg Shoulder Right  Result Date: 03/31/2018 CLINICAL DATA:  Pain EXAM: RIGHT SHOULDER - 2+ VIEW  COMPARISON:  11/08/2015 FINDINGS: Similar appearance of slightly widened AC joint. Old fracture deformity involving the proximal to midshaft of the humerus. No definite acute abnormality is seen. IMPRESSION: 1. Old fracture deformity involving the shaft of the humerus. 2. No definite acute osseous abnormality is seen. Electronically Signed   By: Jasmine Pang M.D.   On: 03/31/2018 02:21   Dg Forearm Right  Result Date: 03/30/2018 CLINICAL DATA:  Right arm pain after fall EXAM: RIGHT FOREARM - 2 VIEW COMPARISON:  Elbow radiograph 11/08/2015 FINDINGS: No acute displaced fracture or malalignment. Soft tissues are unremarkable. IMPRESSION: No acute osseous abnormality Electronically Signed   By: Jasmine Pang M.D.   On: 03/30/2018 22:44   Ct Head Wo Contrast  Result Date: 03/30/2018 CLINICAL DATA:  84 y/o  F; found on floor. EXAM: CT HEAD WITHOUT CONTRAST CT CERVICAL SPINE WITHOUT CONTRAST TECHNIQUE: Multidetector CT imaging of the head and cervical spine was performed following the standard protocol without intravenous contrast. Multiplanar CT image reconstructions of the cervical spine were also generated. COMPARISON:  05/22/2017 CT head FINDINGS: CT HEAD FINDINGS Brain: Stable very small chronic infarction within the right cerebellar hemisphere in tiny chronic lacunar infarcts within the basal ganglia. Stable chronic microvascular ischemic changes and parenchymal volume loss of the brain. No evidence for acute stroke, hemorrhage, focal mass effect of the brain, hydrocephalus, extra-axial collection, or effacement of basilar cisterns. Vascular: Calcific atherosclerosis of carotid siphons. Skull: Normal. Negative for fracture or focal lesion.  Sinuses/Orbits: Stable left mastoid tip sclerosis. Moderate left and mild right maxillary sinus mucosal thickening. Aerosolized secretions in the right sphenoid sinus. Bilateral intra-ocular lens replacement. Other: None. CT CERVICAL SPINE FINDINGS Motion artifact greatest at the C5-C7 levels. Alignment: Straightening of cervical lordosis. C7-T1 grade 1 anterolisthesis secondary to facet hypertrophy. Skull base and vertebrae: No acute fracture. No primary bone lesion or focal pathologic process. Soft tissues and spinal canal: No prevertebral fluid or swelling. No visible canal hematoma. Disc levels: Cervical spondylosis with advanced discogenic degenerative changes at the C5-C7 levels and facet hypertrophy which is severe on the left. Left-sided uncovertebral hypertrophy and facet hypertrophy results in bony foraminal stenosis at the C3 through C7 levels. There is right-sided bony foraminal stenosis primarily at C6-7. No high-grade canal stenosis. Upper chest: Negative. Other: Calcific atherosclerosis of carotid siphons. IMPRESSION: 1. No acute intracranial abnormality or calvarial fracture. 2. Motion degraded cervical spine CT. No acute fracture or malalignment identified. 3. Stable chronic microvascular ischemic changes, parenchymal volume loss, and small chronic infarcts in the brain. 4. Paranasal sinus disease with aerosolized secretions in the right sphenoid sinus which may represent acute sinusitis. 5. Cervical spondylosis with disc and facet degenerative changes greatest at C5-C7 levels. No high-grade bony canal stenosis. Electronically Signed   By: Mitzi Hansen M.D.   On: 03/30/2018 22:53   Ct Cervical Spine Wo Contrast  Result Date: 03/30/2018 CLINICAL DATA:  84 y/o  F; found on floor. EXAM: CT HEAD WITHOUT CONTRAST CT CERVICAL SPINE WITHOUT CONTRAST TECHNIQUE: Multidetector CT imaging of the head and cervical spine was performed following the standard protocol without intravenous contrast.  Multiplanar CT image reconstructions of the cervical spine were also generated. COMPARISON:  05/22/2017 CT head FINDINGS: CT HEAD FINDINGS Brain: Stable very small chronic infarction within the right cerebellar hemisphere in tiny chronic lacunar infarcts within the basal ganglia. Stable chronic microvascular ischemic changes and parenchymal volume loss of the brain. No evidence for acute stroke, hemorrhage, focal mass effect of the brain, hydrocephalus, extra-axial  collection, or effacement of basilar cisterns. Vascular: Calcific atherosclerosis of carotid siphons. Skull: Normal. Negative for fracture or focal lesion. Sinuses/Orbits: Stable left mastoid tip sclerosis. Moderate left and mild right maxillary sinus mucosal thickening. Aerosolized secretions in the right sphenoid sinus. Bilateral intra-ocular lens replacement. Other: None. CT CERVICAL SPINE FINDINGS Motion artifact greatest at the C5-C7 levels. Alignment: Straightening of cervical lordosis. C7-T1 grade 1 anterolisthesis secondary to facet hypertrophy. Skull base and vertebrae: No acute fracture. No primary bone lesion or focal pathologic process. Soft tissues and spinal canal: No prevertebral fluid or swelling. No visible canal hematoma. Disc levels: Cervical spondylosis with advanced discogenic degenerative changes at the C5-C7 levels and facet hypertrophy which is severe on the left. Left-sided uncovertebral hypertrophy and facet hypertrophy results in bony foraminal stenosis at the C3 through C7 levels. There is right-sided bony foraminal stenosis primarily at C6-7. No high-grade canal stenosis. Upper chest: Negative. Other: Calcific atherosclerosis of carotid siphons. IMPRESSION: 1. No acute intracranial abnormality or calvarial fracture. 2. Motion degraded cervical spine CT. No acute fracture or malalignment identified. 3. Stable chronic microvascular ischemic changes, parenchymal volume loss, and small chronic infarcts in the brain. 4. Paranasal  sinus disease with aerosolized secretions in the right sphenoid sinus which may represent acute sinusitis. 5. Cervical spondylosis with disc and facet degenerative changes greatest at C5-C7 levels. No high-grade bony canal stenosis. Electronically Signed   By: Mitzi Hansen M.D.   On: 03/30/2018 22:53   Dg Humerus Right  Result Date: 03/31/2018 CLINICAL DATA:  Fall EXAM: RIGHT HUMERUS - 2+ VIEW COMPARISON:  None. FINDINGS: There is a mildly displaced oblique fracture of the midshaft of the right humerus. Direction of displacement is impossible to determine because of positioning. IMPRESSION: Mildly displaced oblique fracture of the humerus. Electronically Signed   By: Deatra Robinson M.D.   On: 03/31/2018 02:17   Dg Hand Complete Right  Result Date: 03/31/2018 CLINICAL DATA:  Fall EXAM: RIGHT HAND - COMPLETE 3+ VIEW COMPARISON:  None. FINDINGS: Mid acute fracture or dislocation of the right hand. The bones are osteopenic. Mild osteoarthrosis, predominantly of the first carpometacarpal joint and interphalangeal joints of the fifth digit. IMPRESSION: No fracture or dislocation of the right hand. Electronically Signed   By: Deatra Robinson M.D.   On: 03/31/2018 02:19     EKG: Independently reviewed. Sinus rhythm, QTC 445, low voltage, bradycardia, nonspecific T-wave change.  Assessment/Plan Principal Problem:   Humerus shaft fracture-right Active Problems:   Essential hypertension   Hyperlipidemia   Depression, recurrent (HCC)   Alcohol abuse   PAF (paroxysmal atrial fibrillation) (HCC)   Coronary artery disease   Fall   Rhabdomyolysis   Hypothermia  Humerus shaft fracture-right: X-ray showed mildly displaced oblique fracture of the humerus. Pt has severe pain and decreased sensation of the right arm. EDP spoke with [facility] orthopedics physician assistant on call, "They are aware of patient and have no further recommendations at this time.  Will see patient in consultation".  -  will admit to SUD as inpt (pt needs warm blanket) - Pain control: morphine prn and percocet - When necessary Zofran for nausea - Robaxin for muscle spasm - type and cross - INR/PTT - PT/OT when able to (not ordered now)  Leukocytosis: WBC 11.3. Likely due to stress-induced demargination. Patient does not have signs of infection. -Follow-up CBC  Fall: likely due to alcohol intoxication.CT head of C-spine is no acute issues. - PT/OT when able to (not ordered now)   Hypothermia: likely  due to fall and being on the floor for long time -warm blanket  HTN:  -Continue home medications: amlodipine, metoprolol, accupril -IV hydralazine prn  HLD: -zocor  Depression and anxiety: Stable, no suicidal or homicidal ideations. -Continue home medications: Paxil  Alcohol abuse: -Did counseling about the importance of quitting drinking -CIWA protocol  CAD: no CP -continue zocor and metoprolol  Atrial Fibrillation: CHA2DS2-VASc Score is 5, needs oral anticoagulation. Patient is on Xarelto at home. Heart rate is well controlled. -hold Xarelto in case pt needs surgery -continue metoprolol  Rhabdomyolysis: CK 3046 -IVF: NS 125 cc/h -repeat CK tomorrow   DVT ppx: SCD Code Status: Full code Family Communication: None at bed side.  Disposition Plan:  Anticipate discharge back to previous home environment Consults called:  Ortho Admission status:   SDU/inpation       Date of Service 03/31/2018    Lorretta Harp [facility] Pager [phone]  If 7PM-7AM, please contact night-coverage [email or web address] Password [facility] 03/31/2018, 5:44 AM

## 2018-03-31 NOTE — Progress Notes (Signed)
Patient seen and examined at bedside, patient admitted after midnight, please see earlier detailed admission note by Lorretta Harp, MD. Briefly, patient presented after a fall and found to have a right humerus fracture. Orthopedic surgery has assess and is recommending a brace.   Jacquelin Hawking, MD [facility] 03/31/2018, 3:17 PM Pager: [phone]

## 2018-03-31 NOTE — ED Notes (Signed)
Ordered diet tray for pt  

## 2018-03-31 NOTE — Consult Note (Signed)
Reason for Consult:Right humerus fx Referring Physician: R Rosio Weiss [name] is an 84 y.o. female.  HPI: Christina Ortiz fell last night and got her right arm wedged between the mattress and headboard. She was unable to summon help for about 2h. She does not remember the circumstances of the fall. She was brought to the ED where x-rays showed a right midshaft humerus fx and orthopedic surgery was consulted. We were called again this afternoon when she hadn't been seen; it's unclear at this point where the communication breakdown occurred. Aside from localized pain in her upper arm she's also c/o more distal pain and feels things are worse now than last night.  Past Medical History:  Diagnosis Date  . Acute bronchitis 10/2017  . Arthritis   . CKD (chronic kidney disease)    Xarelto dose is 15 mg QD  . Coronary artery disease    a. s/p PCI/stenting to RCA and LAD ([facility] in [place]) in 2006; b. Nuc 1/15: No ischemia, EF 70%  . Depression   . Gastritis   . History of cholecystectomy   . History of echocardiogram    a. Echo 1/15: Moderate LVH, EF 55-60%, impaired relaxation, mild AS  . Hyperlipidemia   . Hypertension   . Hypokalemia   . Mobitz type 1 second degree atrioventricular block    a. Event Monitor 3/15: NSR, first-degree AV block, second-degree AB block - Mobitz 1, PACs, NSVT (5 beats)  . PAF (paroxysmal atrial fibrillation) ([place])   . Thyroid disease     Past Surgical History:  Procedure Laterality Date  . BREAST LUMPECTOMY Left   . CARDIAC CATHETERIZATION    . CATARACT EXTRACTION Bilateral   . CHOLECYSTECTOMY    . DILATION AND CURETTAGE OF UTERUS    . EYE SURGERY      Family History  Problem Relation Age of Onset  . Heart disease Mother   . Heart attack Mother   . Lung cancer Father   . Cancer Father        lung    Social History:  reports that she quit smoking about 2 years ago. Her smoking use included cigarettes. She has a 33.50 pack-year smoking history.  She has never used smokeless tobacco. She reports that she drinks about 6.0 oz of alcohol per week. She reports that she does not use drugs.  Allergies:  Allergies  Allergen Reactions  . Tape Other (See Comments)    Patient's skin is thin and will TEAR AND BRUISE EASILY    Medications: I have reviewed the patient's current medications.  Results for orders placed or performed during the hospital encounter of 03/31/18 (from the past 48 hour(s))  CBC     Status: Abnormal   Collection Time: 03/30/18  9:59 PM  Result Value Ref Range   WBC 11.3 (H) 4.0 - 10.5 K/uL   RBC 4.02 3.87 - 5.11 MIL/uL   Hemoglobin 12.7 12.0 - 15.0 g/dL   HCT 39.9 36.0 - 46.0 %   MCV 99.3 78.0 - 100.0 fL   MCH 31.6 26.0 - 34.0 pg   MCHC 31.8 30.0 - 36.0 g/dL   RDW 13.3 11.5 - 15.5 %   Platelets 204 150 - 400 K/uL    Comment: Performed at [facility] [address] [place], [place] [address]  Basic metabolic panel     Status: Abnormal   Collection Time: 03/30/18  9:59 PM  Result Value Ref Range   Sodium 141 135 -  145 mmol/L   Potassium 3.9 3.5 - 5.1 mmol/L   Chloride 108 101 - 111 mmol/L   CO2 18 (L) 22 - 32 mmol/L   Glucose, Bld 110 (H) 65 - 99 mg/dL   BUN 29 (H) 6 - 20 mg/dL   Creatinine, Ser 1.00 0.44 - 1.00 mg/dL   Calcium 8.9 8.9 - 10.3 mg/dL   GFR calc non Af Amer 50 (L) >60 mL/min   GFR calc Af Amer 57 (L) >60 mL/min    Comment: (NOTE) The eGFR has been calculated using the CKD EPI equation. This calculation has not been validated in all clinical situations. eGFR's persistently <60 mL/min signify possible Chronic Kidney Disease.    Anion gap 15 5 - 15    Comment: Performed at [facility] [address], [place], [place] [address]  Ethanol     Status: Abnormal   Collection Time: 03/30/18  9:59 PM  Result Value Ref Range   Alcohol, Ethyl (B) 159 (H) <10 mg/dL    Comment:        LOWEST DETECTABLE LIMIT FOR SERUM ALCOHOL IS 10 mg/dL FOR MEDICAL PURPOSES ONLY Performed at  [facility], [place] [address], [place], [place] [address]   CK     Status: Abnormal   Collection Time: 03/30/18  9:59 PM  Result Value Ref Range   Total CK 3,046 (H) 38 - 234 U/L    Comment: Performed at [facility], [place] [address] [place], [place] [address]  Type and screen [place]     Status: None   Collection Time: 03/31/18  5:25 AM  Result Value Ref Range   ABO/RH(D) O POS    Antibody Screen NEG    Sample Expiration      04/03/2018 Performed at [facility], [place] [address], [place], [place] [address]   ABO/Rh     Status: None   Collection Time: 03/31/18  5:25 AM  Result Value Ref Range   ABO/RH(D)      O POS Performed at [facility] [address], [place], [place] [address]   Protime-INR     Status: None   Collection Time: 03/31/18  5:27 AM  Result Value Ref Range   Prothrombin Time 14.5 11.4 - 15.2 seconds   INR 1.13     Comment: Performed at [facility] [address], [place], [place] [address]  APTT     Status: None   Collection Time: 03/31/18  5:27 AM  Result Value Ref Range   aPTT 29 24 - 36 seconds    Comment: Performed at [facility] [address], [place], [place] [address]    Dg Shoulder Right  Result Date: 03/31/2018 CLINICAL DATA:  Pain EXAM: RIGHT SHOULDER - 2+ VIEW COMPARISON:  11/08/2015 FINDINGS: Similar appearance of slightly widened AC joint. Old fracture deformity involving the proximal to midshaft of the humerus. No definite acute abnormality is seen. IMPRESSION: 1. Old fracture deformity involving the shaft of the humerus. 2. No definite acute osseous abnormality is seen. Electronically Signed   By: Donavan Foil M.D.   On: 03/31/2018 02:21   Dg Forearm Right  Result Date: 03/30/2018 CLINICAL DATA:  Right arm pain after fall EXAM: RIGHT FOREARM - 2 VIEW COMPARISON:  Elbow radiograph 11/08/2015 FINDINGS: No acute displaced fracture or malalignment. Soft tissues are unremarkable.  IMPRESSION: No acute osseous abnormality Electronically Signed   By: Maudie Mercury  Francoise Ceo M.D.   On: 03/30/2018 22:44   Ct Head Wo Contrast  Result Date: 03/30/2018 CLINICAL DATA:  84 y/o  F; found on floor. EXAM: CT HEAD WITHOUT CONTRAST CT CERVICAL SPINE WITHOUT CONTRAST TECHNIQUE: Multidetector CT imaging of the head and cervical spine was performed following the standard protocol without intravenous contrast. Multiplanar CT image reconstructions of the cervical spine were also generated. COMPARISON:  05/22/2017 CT head FINDINGS: CT HEAD FINDINGS Brain: Stable very small chronic infarction within the right cerebellar hemisphere in tiny chronic lacunar infarcts within the basal ganglia. Stable chronic microvascular ischemic changes and parenchymal volume loss of the brain. No evidence for acute stroke, hemorrhage, focal mass effect of the brain, hydrocephalus, extra-axial collection, or effacement of basilar cisterns. Vascular: Calcific atherosclerosis of carotid siphons. Skull: Normal. Negative for fracture or focal lesion. Sinuses/Orbits: Stable left mastoid tip sclerosis. Moderate left and mild right maxillary sinus mucosal thickening. Aerosolized secretions in the right sphenoid sinus. Bilateral intra-ocular lens replacement. Other: None. CT CERVICAL SPINE FINDINGS Motion artifact greatest at the C5-C7 levels. Alignment: Straightening of cervical lordosis. C7-T1 grade 1 anterolisthesis secondary to facet hypertrophy. Skull base and vertebrae: No acute fracture. No primary bone lesion or focal pathologic process. Soft tissues and spinal canal: No prevertebral fluid or swelling. No visible canal hematoma. Disc levels: Cervical spondylosis with advanced discogenic degenerative changes at the C5-C7 levels and facet hypertrophy which is severe on the left. Left-sided uncovertebral hypertrophy and facet hypertrophy results in bony foraminal stenosis at the C3 through C7 levels. There is right-sided bony foraminal  stenosis primarily at C6-7. No high-grade canal stenosis. Upper chest: Negative. Other: Calcific atherosclerosis of carotid siphons. IMPRESSION: 1. No acute intracranial abnormality or calvarial fracture. 2. Motion degraded cervical spine CT. No acute fracture or malalignment identified. 3. Stable chronic microvascular ischemic changes, parenchymal volume loss, and small chronic infarcts in the brain. 4. Paranasal sinus disease with aerosolized secretions in the right sphenoid sinus which may represent acute sinusitis. 5. Cervical spondylosis with disc and facet degenerative changes greatest at C5-C7 levels. No high-grade bony canal stenosis. Electronically Signed   By: Kristine Garbe M.D.   On: 03/30/2018 22:53   Ct Cervical Spine Wo Contrast  Result Date: 03/30/2018 CLINICAL DATA:  84 y/o  F; found on floor. EXAM: CT HEAD WITHOUT CONTRAST CT CERVICAL SPINE WITHOUT CONTRAST TECHNIQUE: Multidetector CT imaging of the head and cervical spine was performed following the standard protocol without intravenous contrast. Multiplanar CT image reconstructions of the cervical spine were also generated. COMPARISON:  05/22/2017 CT head FINDINGS: CT HEAD FINDINGS Brain: Stable very small chronic infarction within the right cerebellar hemisphere in tiny chronic lacunar infarcts within the basal ganglia. Stable chronic microvascular ischemic changes and parenchymal volume loss of the brain. No evidence for acute stroke, hemorrhage, focal mass effect of the brain, hydrocephalus, extra-axial collection, or effacement of basilar cisterns. Vascular: Calcific atherosclerosis of carotid siphons. Skull: Normal. Negative for fracture or focal lesion. Sinuses/Orbits: Stable left mastoid tip sclerosis. Moderate left and mild right maxillary sinus mucosal thickening. Aerosolized secretions in the right sphenoid sinus. Bilateral intra-ocular lens replacement. Other: None. CT CERVICAL SPINE FINDINGS Motion artifact greatest at  the C5-C7 levels. Alignment: Straightening of cervical lordosis. C7-T1 grade 1 anterolisthesis secondary to facet hypertrophy. Skull base and vertebrae: No acute fracture. No primary bone lesion or focal pathologic process. Soft tissues and spinal canal: No prevertebral fluid or swelling. No visible canal hematoma. Disc levels: Cervical spondylosis with advanced discogenic degenerative changes at the  C5-C7 levels and facet hypertrophy which is severe on the left. Left-sided uncovertebral hypertrophy and facet hypertrophy results in bony foraminal stenosis at the C3 through C7 levels. There is right-sided bony foraminal stenosis primarily at C6-7. No high-grade canal stenosis. Upper chest: Negative. Other: Calcific atherosclerosis of carotid siphons. IMPRESSION: 1. No acute intracranial abnormality or calvarial fracture. 2. Motion degraded cervical spine CT. No acute fracture or malalignment identified. 3. Stable chronic microvascular ischemic changes, parenchymal volume loss, and small chronic infarcts in the brain. 4. Paranasal sinus disease with aerosolized secretions in the right sphenoid sinus which may represent acute sinusitis. 5. Cervical spondylosis with disc and facet degenerative changes greatest at C5-C7 levels. No high-grade bony canal stenosis. Electronically Signed   By: Kristine Garbe M.D.   On: 03/30/2018 22:53   Dg Humerus Right  Result Date: 03/31/2018 CLINICAL DATA:  Fall EXAM: RIGHT HUMERUS - 2+ VIEW COMPARISON:  None. FINDINGS: There is a mildly displaced oblique fracture of the midshaft of the right humerus. Direction of displacement is impossible to determine because of positioning. IMPRESSION: Mildly displaced oblique fracture of the humerus. Electronically Signed   By: Ulyses Jarred M.D.   On: 03/31/2018 02:17   Dg Hand Complete Right  Result Date: 03/31/2018 CLINICAL DATA:  Fall EXAM: RIGHT HAND - COMPLETE 3+ VIEW COMPARISON:  None. FINDINGS: Mid acute fracture or  dislocation of the right hand. The bones are osteopenic. Mild osteoarthrosis, predominantly of the first carpometacarpal joint and interphalangeal joints of the fifth digit. IMPRESSION: No fracture or dislocation of the right hand. Electronically Signed   By: Ulyses Jarred M.D.   On: 03/31/2018 02:19    Review of Systems  Constitutional: Negative for weight loss.  HENT: Negative for ear discharge, ear pain, hearing loss and tinnitus.   Eyes: Negative for blurred vision, double vision, photophobia and pain.  Respiratory: Negative for cough, sputum production and shortness of breath.   Cardiovascular: Negative for chest pain.  Gastrointestinal: Negative for abdominal pain, nausea and vomiting.  Genitourinary: Negative for dysuria, flank pain, frequency and urgency.  Musculoskeletal: Positive for joint pain (Right arm). Negative for back pain, falls, myalgias and neck pain.  Neurological: Negative for dizziness, tingling, sensory change, focal weakness, loss of consciousness and headaches.  Endo/Heme/Allergies: Does not bruise/bleed easily.  Psychiatric/Behavioral: Negative for depression, memory loss and substance abuse. The patient is not nervous/anxious.    Blood pressure (!) 156/77, pulse 70, temperature (!) 97.5 F (36.4 C), temperature source Axillary, resp. rate 18, height _0  (1.651 m), weight 72.6 kg (160 lb), SpO2 96 %. Physical Exam  Constitutional: She appears well-developed and well-nourished. No distress.  HENT:  Head: Normocephalic and atraumatic.  Eyes: Conjunctivae are normal. Right eye exhibits no discharge. Left eye exhibits no discharge. No scleral icterus.  Neck: Normal range of motion.  Cardiovascular: Normal rate and regular rhythm.  Respiratory: Effort normal. No respiratory distress.  Musculoskeletal:  Right shoulder, elbow, wrist, digits- no skin wounds, TTP upper arm  Sens  Ax intact R/M/U paresthetic to absent  Mot   Ax/ R/ PIN/ M/ AIN/ U grossly intact  Rad  2+  Neurological: She is alert.  Skin: Skin is warm and dry. She is not diaphoretic.  Psychiatric: She has a normal mood and affect. Her behavior is normal.    Assessment/Plan: Fall Right humerus fx -- Acceptable alignment, will place in coap splint and make NWB. Can likely treat without surgery. No signs of compartment syndrome but will need to watch  NV status of distal arm/hand. Multiple medical problems -- per primary service    Lisette Abu, PA-C Orthopedic Surgery [phone] 03/31/2018, 1:00 PM

## 2018-03-31 NOTE — ED Notes (Signed)
Patient transported to X-ray 

## 2018-04-01 ENCOUNTER — Other Ambulatory Visit: Payer: Self-pay

## 2018-04-01 LAB — BASIC METABOLIC PANEL
Anion gap: 13 (ref 5–15)
BUN: 14 mg/dL (ref 6–20)
CHLORIDE: 104 mmol/L (ref 101–111)
CO2: 22 mmol/L (ref 22–32)
Calcium: 8.5 mg/dL — ABNORMAL LOW (ref 8.9–10.3)
Creatinine, Ser: 0.59 mg/dL (ref 0.44–1.00)
GFR calc Af Amer: >60 mL/min (ref >60)
GFR calc non Af Amer: >60 mL/min (ref >60)
GLUCOSE: 119 mg/dL — AB (ref 65–99)
Potassium: 2.8 mmol/L — ABNORMAL LOW (ref 3.5–5.1)
Sodium: 139 mmol/L (ref 135–145)

## 2018-04-01 LAB — CK: Total CK: 6010 U/L — ABNORMAL HIGH (ref 38–234)

## 2018-04-01 LAB — GLUCOSE, CAPILLARY: GLUCOSE-CAPILLARY: 111 mg/dL — AB (ref 65–99)

## 2018-04-01 MED ORDER — POTASSIUM CHLORIDE CRYS ER 20 MEQ PO TBCR
30.0000 meq | EXTENDED_RELEASE_TABLET | ORAL | Status: AC
  Administered 2018-04-01 (×2): 30 meq via ORAL
  Filled 2018-04-01 (×2): qty 1

## 2018-04-01 MED ORDER — POLYETHYLENE GLYCOL 3350 17 G PO PACK
17.0000 g | PACK | Freq: Every day | ORAL | Status: DC
  Administered 2018-04-01 – 2018-04-04 (×3): 17 g via ORAL
  Filled 2018-04-01 (×4): qty 1

## 2018-04-01 MED ORDER — PROSIGHT PO TABS
1.0000 | ORAL_TABLET | Freq: Every day | ORAL | Status: DC
  Administered 2018-04-01 – 2018-04-04 (×4): 1 via ORAL
  Filled 2018-04-01 (×4): qty 1

## 2018-04-01 MED ORDER — RIVAROXABAN 15 MG PO TABS
15.0000 mg | ORAL_TABLET | Freq: Every day | ORAL | Status: DC
  Administered 2018-04-01 – 2018-04-03 (×3): 15 mg via ORAL
  Filled 2018-04-01 (×4): qty 1

## 2018-04-01 MED ORDER — ENSURE ENLIVE PO LIQD
237.0000 mL | Freq: Two times a day (BID) | ORAL | Status: DC
  Administered 2018-04-02 – 2018-04-03 (×2): 237 mL via ORAL

## 2018-04-01 NOTE — Evaluation (Signed)
Physical Therapy Evaluation Patient Details Name: Christina Ortiz MRN: [id number] DOB: Mar 30, 1929 Today's Date: 04/01/2018   History of Present Illness  Rylea fell 4/4 and got her right arm wedged between the mattress and headboard. She was unable to summon help for about 2h. She does not remember the circumstances of the fall. She was brought to the ED where x-rays showed a right midshaft humerus fx and orthopedic surgery was consulted. Decided to have nonoperative managment and brace UE. PMH:  Bronchitis, PAF.  Clinical Impression  Pt admitted with above diagnosis. Pt currently with functional limitations due to the deficits listed below (see PT Problem List). Pt was able to ambulate with single UE support in controlled environment. Cannot withstand challenges to balance.  Will be difficult for pt to do self care given that right arm will be immobilized for several weeeks.  SNF recommended.  Will follow acutely.  Pt will benefit from skilled PT to increase their independence and safety with mobility to allow discharge to the venue listed below.      Follow Up Recommendations SNF;Supervision/Assistance - 24 hour    Equipment Recommendations  (possibly quad cane or hemiwalker)    Recommendations for Other Services       Precautions / Restrictions Precautions Precautions: Fall;Shoulder Type of Shoulder Precautions: Sling Shoulder Interventions: Shoulder sling/immobilizer;At all times Restrictions Weight Bearing Restrictions: Yes RUE Weight Bearing: Non weight bearing      Mobility  Bed Mobility Overal bed mobility: Needs Assistance Bed Mobility: Supine to Sit     Supine to sit: Min guard     General bed mobility comments: Pt used rail to assist with sitting up .  Transfers Overall transfer level: Needs assistance Equipment used: None;1 person hand held assist Transfers: Sit to/from Stand Sit to Stand: Min assist         General transfer comment: Pt reaching for PT or  furniture for steadying asssit.   Ambulation/Gait Ambulation/Gait assistance: Min assist Ambulation Distance (Feet): 120 Feet Assistive device: 1 person hand held assist(held onto IV pole at times vs furniture) Gait Pattern/deviations: Step-through pattern;Decreased stride length   Gait velocity interpretation: Below normal speed for age/gender General Gait Details: Pt relies on single UE support.  Overall, fair balance with single UE support but struggles with challenges to balance.   Stairs            Wheelchair Mobility    Modified Rankin (Stroke Patients Only)       Balance Overall balance assessment: Needs assistance;History of Falls Sitting-balance support: No upper extremity supported;Feet supported Sitting balance-Leahy Scale: Fair     Standing balance support: Single extremity supported;During functional activity Standing balance-Leahy Scale: Poor Standing balance comment: relies on single UE support for balance.                              Pertinent Vitals/Pain Pain Assessment: Faces Faces Pain Scale: Hurts little more Pain Location: right UE Pain Descriptors / Indicators: Aching;Discomfort;Guarding;Grimacing Pain Intervention(s): Limited activity within patient's tolerance;Monitored during session;Repositioned  VSS with sats >94% on RA therefore removed O2.  (on 1L on arrival)All other VSS.    Home Living Family/patient expects to be discharged to:: Private residence Living Arrangements: Alone Available Help at Discharge: Family;Available PRN/intermittently Type of Home: Other(Comment)(condo) Home Access: Level entry     Home Layout: One level Home Equipment: Walker - 2 wheels;Cane - single point;Wheelchair - manual;Shower seat;Bedside commode(2LO2 at night at home) Additional  Comments: Husband died last year. Equipment listed above was husbands.    Prior Function Level of Independence: Independent         Comments: Pt was still  driving PTA.  Daughter called daily and pt would go stay with daughter during day some days and daughter checked on pt a few times a week.      Hand Dominance   Dominant Hand: Right    Extremity/Trunk Assessment   Upper Extremity Assessment Upper Extremity Assessment: Defer to OT evaluation    Lower Extremity Assessment Lower Extremity Assessment: Generalized weakness    Cervical / Trunk Assessment Cervical / Trunk Assessment: Normal  Communication   Communication: HOH  Cognition Arousal/Alertness: Awake/alert Behavior During Therapy: WFL for tasks assessed/performed Overall Cognitive Status: Within Functional Limits for tasks assessed                                        General Comments      Exercises     Assessment/Plan    PT Assessment Patient needs continued PT services  PT Problem List Decreased activity tolerance;Decreased balance;Decreased mobility;Decreased knowledge of use of DME;Decreased safety awareness;Decreased knowledge of precautions;Cardiopulmonary status limiting activity;Pain       PT Treatment Interventions DME instruction;Gait training;Functional mobility training;Therapeutic activities;Therapeutic exercise;Balance training;Patient/family education    PT Goals (Current goals can be found in the Care Plan section)  Acute Rehab PT Goals Patient Stated Goal: to get better PT Goal Formulation: With patient Time For Goal Achievement: 04/15/18 Potential to Achieve Goals: Good    Frequency Min 3X/week   Barriers to discharge Decreased caregiver support      Co-evaluation               AM-PAC PT "6 Clicks" Daily Activity  Outcome Measure Difficulty turning over in bed (including adjusting bedclothes, sheets and blankets)?: Unable Difficulty moving from lying on back to sitting on the side of the bed? : Unable Difficulty sitting down on and standing up from a chair with arms (e.g., wheelchair, bedside commode, etc,.)?:  A Little Help needed moving to and from a bed to chair (including a wheelchair)?: A Little Help needed walking in hospital room?: A Little Help needed climbing 3-5 steps with a railing? : A Lot 6 Click Score: 13    End of Session Equipment Utilized During Treatment: Gait belt Activity Tolerance: Patient limited by fatigue Patient left: in chair;with call bell/phone within reach;with chair alarm set Nurse Communication: Mobility status PT Visit Diagnosis: Unsteadiness on feet (R26.81);Muscle weakness (generalized) (M62.81);Pain Pain - Right/Left: Right Pain - part of body: Shoulder;Hand;Arm    Time: 1561-5379 PT Time Calculation (min) (ACUTE ONLY): 31 min   Charges:   PT Evaluation $PT Eval Moderate Complexity: 1 Mod PT Treatments $Gait Training: 8-22 mins   PT G Codes:        Edson Deridder,PT Acute Rehabilitation [phone] (pager)   Berline Lopes 04/01/2018, 10:32 AM

## 2018-04-01 NOTE — Progress Notes (Addendum)
PROGRESS NOTE    Tresea Zasada  LMB:[id number] DOB: Dec 22, 1929 DOA: 03/31/2018 PCP: Julaine Fusi, NP   Brief Narrative: Christina Ortiz is a 84 y.o. female with medical history significant of Alcohol abuse, hypertension, hyperlipidemia, GERD, hypothyroidism, depression,atrial fibrillation on Xarelto, CAD, s/p stent placement. She presented after a fall while intoxicated and found to have a midshaft humeral fracture.     Assessment & Plan:   Principal Problem:   Humerus shaft fracture-right Active Problems:   Essential hypertension   Hyperlipidemia   Depression, recurrent (HCC)   Alcohol abuse   PAF (paroxysmal atrial fibrillation) (HCC)   Coronary artery disease   Fall   Rhabdomyolysis   Hypothermia   Right midshaft humeral fracture Oblique, mildly displaced. Casted by ortho tech. Outpatient follow-up. -Continue analgesics prn -Continue sling  Alcohol abuse Patient binge drinks. States she does not drink every day. Ethanol elevated on admission. Likely contributed heavily to fall. -CIWA  Paroxysmal atrial fibrillation Normal rate. -Continue Xarelto and Toprol XL  CAD Hyperlipidemia -Continue simvastatin  Rhabdomyolysis Secondary to fall/long lie/fracture. Slight improvement from yesterday -Daily CK  Hypothermia Resolved. No evidence of infection.  Hypothyroidism -Continue Synthroid  Essential hypertension Slightly uncontrolled. -Continue amlodipine, lisinopril  Depression -Continue Paxil   DVT prophylaxis: Xarelto Code Status:   Code Status: Full Code Family Communication: None at bedside Disposition Plan: Discharge in several days pending PT and possible alcohol withdrawal   Consultants:   Orthopedic surgery ([facility])  Procedures:   None  Antimicrobials:  None    Subjective: No issues overnight.  Objective: Vitals:   03/31/18 2156 03/31/18 2330 04/01/18 0335 04/01/18 0700  BP: (!) 156/66 (!) 147/79 (!) 158/84 (!)  161/84  Pulse:  81 (!) 53 85  Resp:  (!) 24 (!) 23 (!) 25  Temp:  (!) 97.4 F (36.3 C) 97.7 F (36.5 C) (!) 97.3 F (36.3 C)  TempSrc:  Oral Oral Oral  SpO2:  93% 95% 94%  Weight:      Height:        Intake/Output Summary (Last 24 hours) at 04/01/2018 0819 Last data filed at 04/01/2018 0400 Gross per 24 hour  Intake 2054.58 ml  Output 1175 ml  Net 879.58 ml   Filed Weights   03/30/18 2156  Weight: 72.6 kg (160 lb)    Examination:  General exam: Appears calm and comfortable Respiratory system: Clear to auscultation. Respiratory effort normal. Cardiovascular system: S1 & S2 heard, irregular rhythm with normal rate. No murmurs, rubs, gallops or clicks. Gastrointestinal system: Abdomen is nondistended, soft and nontender. No organomegaly or masses felt. Normal bowel sounds heard. Central nervous system: Alert and oriented. Right hand numbness with 4/5 grip strength on right compared to 5/5 on left. Right arm in a sling. Extremities: No edema. No calf tenderness Skin: No cyanosis. No rashes Psychiatry: Judgement and insight appear normal. Mood & affect appropriate.     Data Reviewed: I have personally reviewed following labs and imaging studies  CBC: Recent Labs  Lab 03/30/18 2159  WBC 11.3*  HGB 12.7  HCT 39.9  MCV 99.3  PLT 204   Basic Metabolic Panel: Recent Labs  Lab 03/30/18 2159 04/01/18 0242  NA 141 139  K 3.9 2.8*  CL 108 104  CO2 18* 22  GLUCOSE 110* 119*  BUN 29* 14  CREATININE 1.00 0.59  CALCIUM 8.9 8.5*   GFR: Estimated Creatinine Clearance: 50.4 mL/min (by C-G formula based on SCr of 0.59 mg/dL). Liver Function Tests: No results for  input(s): AST, ALT, ALKPHOS, BILITOT, PROT, ALBUMIN in the last 168 hours. No results for input(s): LIPASE, AMYLASE in the last 168 hours. No results for input(s): AMMONIA in the last 168 hours. Coagulation Profile: Recent Labs  Lab 03/31/18 0527  INR 1.13   Cardiac Enzymes: Recent Labs  Lab 03/30/18 2159  03/31/18 1505 04/01/18 0242  CKTOTAL 3,046* 7,030* 6,010*   BNP (last 3 results) No results for input(s): PROBNP in the last 8760 hours. HbA1C: No results for input(s): HGBA1C in the last 72 hours. CBG: No results for input(s): GLUCAP in the last 168 hours. Lipid Profile: No results for input(s): CHOL, HDL, LDLCALC, TRIG, CHOLHDL, LDLDIRECT in the last 72 hours. Thyroid Function Tests: No results for input(s): TSH, T4TOTAL, FREET4, T3FREE, THYROIDAB in the last 72 hours. Anemia Panel: No results for input(s): VITAMINB12, FOLATE, FERRITIN, TIBC, IRON, RETICCTPCT in the last 72 hours. Sepsis Labs: No results for input(s): PROCALCITON, LATICACIDVEN in the last 168 hours.  Recent Results (from the past 240 hour(s))  MRSA PCR Screening     Status: None   Collection Time: 03/31/18  6:00 PM  Result Value Ref Range Status   MRSA by PCR NEGATIVE NEGATIVE Final    Comment:        The GeneXpert MRSA Assay (FDA approved for NASAL specimens only), is one component of a comprehensive MRSA colonization surveillance program. It is not intended to diagnose MRSA infection nor to guide or monitor treatment for MRSA infections. Performed at [facility], [address], [place], [place] [address]          Radiology Studies: Dg Shoulder Right  Result Date: 03/31/2018 CLINICAL DATA:  Pain EXAM: RIGHT SHOULDER - 2+ VIEW COMPARISON:  11/08/2015 FINDINGS: Similar appearance of slightly widened AC joint. Old fracture deformity involving the proximal to midshaft of the humerus. No definite acute abnormality is seen. IMPRESSION: 1. Old fracture deformity involving the shaft of the humerus. 2. No definite acute osseous abnormality is seen. Electronically Signed   By: Jasmine Pang M.D.   On: 03/31/2018 02:21   Dg Forearm Right  Result Date: 03/30/2018 CLINICAL DATA:  Right arm pain after fall EXAM: RIGHT FOREARM - 2 VIEW COMPARISON:  Elbow radiograph 11/08/2015 FINDINGS: No acute displaced  fracture or malalignment. Soft tissues are unremarkable. IMPRESSION: No acute osseous abnormality Electronically Signed   By: Jasmine Pang M.D.   On: 03/30/2018 22:44   Ct Head Wo Contrast  Result Date: 03/30/2018 CLINICAL DATA:  84 y/o  F; found on floor. EXAM: CT HEAD WITHOUT CONTRAST CT CERVICAL SPINE WITHOUT CONTRAST TECHNIQUE: Multidetector CT imaging of the head and cervical spine was performed following the standard protocol without intravenous contrast. Multiplanar CT image reconstructions of the cervical spine were also generated. COMPARISON:  05/22/2017 CT head FINDINGS: CT HEAD FINDINGS Brain: Stable very small chronic infarction within the right cerebellar hemisphere in tiny chronic lacunar infarcts within the basal ganglia. Stable chronic microvascular ischemic changes and parenchymal volume loss of the brain. No evidence for acute stroke, hemorrhage, focal mass effect of the brain, hydrocephalus, extra-axial collection, or effacement of basilar cisterns. Vascular: Calcific atherosclerosis of carotid siphons. Skull: Normal. Negative for fracture or focal lesion. Sinuses/Orbits: Stable left mastoid tip sclerosis. Moderate left and mild right maxillary sinus mucosal thickening. Aerosolized secretions in the right sphenoid sinus. Bilateral intra-ocular lens replacement. Other: None. CT CERVICAL SPINE FINDINGS Motion artifact greatest at the C5-C7 levels. Alignment: Straightening of cervical lordosis. C7-T1 grade 1 anterolisthesis secondary to  facet hypertrophy. Skull base and vertebrae: No acute fracture. No primary bone lesion or focal pathologic process. Soft tissues and spinal canal: No prevertebral fluid or swelling. No visible canal hematoma. Disc levels: Cervical spondylosis with advanced discogenic degenerative changes at the C5-C7 levels and facet hypertrophy which is severe on the left. Left-sided uncovertebral hypertrophy and facet hypertrophy results in bony foraminal stenosis at the C3  through C7 levels. There is right-sided bony foraminal stenosis primarily at C6-7. No high-grade canal stenosis. Upper chest: Negative. Other: Calcific atherosclerosis of carotid siphons. IMPRESSION: 1. No acute intracranial abnormality or calvarial fracture. 2. Motion degraded cervical spine CT. No acute fracture or malalignment identified. 3. Stable chronic microvascular ischemic changes, parenchymal volume loss, and small chronic infarcts in the brain. 4. Paranasal sinus disease with aerosolized secretions in the right sphenoid sinus which may represent acute sinusitis. 5. Cervical spondylosis with disc and facet degenerative changes greatest at C5-C7 levels. No high-grade bony canal stenosis. Electronically Signed   By: Mitzi Hansen M.D.   On: 03/30/2018 22:53   Ct Cervical Spine Wo Contrast  Result Date: 03/30/2018 CLINICAL DATA:  84 y/o  F; found on floor. EXAM: CT HEAD WITHOUT CONTRAST CT CERVICAL SPINE WITHOUT CONTRAST TECHNIQUE: Multidetector CT imaging of the head and cervical spine was performed following the standard protocol without intravenous contrast. Multiplanar CT image reconstructions of the cervical spine were also generated. COMPARISON:  05/22/2017 CT head FINDINGS: CT HEAD FINDINGS Brain: Stable very small chronic infarction within the right cerebellar hemisphere in tiny chronic lacunar infarcts within the basal ganglia. Stable chronic microvascular ischemic changes and parenchymal volume loss of the brain. No evidence for acute stroke, hemorrhage, focal mass effect of the brain, hydrocephalus, extra-axial collection, or effacement of basilar cisterns. Vascular: Calcific atherosclerosis of carotid siphons. Skull: Normal. Negative for fracture or focal lesion. Sinuses/Orbits: Stable left mastoid tip sclerosis. Moderate left and mild right maxillary sinus mucosal thickening. Aerosolized secretions in the right sphenoid sinus. Bilateral intra-ocular lens replacement. Other: None. CT  CERVICAL SPINE FINDINGS Motion artifact greatest at the C5-C7 levels. Alignment: Straightening of cervical lordosis. C7-T1 grade 1 anterolisthesis secondary to facet hypertrophy. Skull base and vertebrae: No acute fracture. No primary bone lesion or focal pathologic process. Soft tissues and spinal canal: No prevertebral fluid or swelling. No visible canal hematoma. Disc levels: Cervical spondylosis with advanced discogenic degenerative changes at the C5-C7 levels and facet hypertrophy which is severe on the left. Left-sided uncovertebral hypertrophy and facet hypertrophy results in bony foraminal stenosis at the C3 through C7 levels. There is right-sided bony foraminal stenosis primarily at C6-7. No high-grade canal stenosis. Upper chest: Negative. Other: Calcific atherosclerosis of carotid siphons. IMPRESSION: 1. No acute intracranial abnormality or calvarial fracture. 2. Motion degraded cervical spine CT. No acute fracture or malalignment identified. 3. Stable chronic microvascular ischemic changes, parenchymal volume loss, and small chronic infarcts in the brain. 4. Paranasal sinus disease with aerosolized secretions in the right sphenoid sinus which may represent acute sinusitis. 5. Cervical spondylosis with disc and facet degenerative changes greatest at C5-C7 levels. No high-grade bony canal stenosis. Electronically Signed   By: Mitzi Hansen M.D.   On: 03/30/2018 22:53   Dg Humerus Right  Result Date: 03/31/2018 CLINICAL DATA:  Fall EXAM: RIGHT HUMERUS - 2+ VIEW COMPARISON:  None. FINDINGS: There is a mildly displaced oblique fracture of the midshaft of the right humerus. Direction of displacement is impossible to determine because of positioning. IMPRESSION: Mildly displaced oblique fracture of the humerus. Electronically  Signed   By: Deatra Robinson M.D.   On: 03/31/2018 02:17   Dg Hand Complete Right  Result Date: 03/31/2018 CLINICAL DATA:  Fall EXAM: RIGHT HAND - COMPLETE 3+ VIEW  COMPARISON:  None. FINDINGS: Mid acute fracture or dislocation of the right hand. The bones are osteopenic. Mild osteoarthrosis, predominantly of the first carpometacarpal joint and interphalangeal joints of the fifth digit. IMPRESSION: No fracture or dislocation of the right hand. Electronically Signed   By: Deatra Robinson M.D.   On: 03/31/2018 02:19        Scheduled Meds: . amLODipine  10 mg Oral Daily  . cholecalciferol  1,000 Units Oral QPM  . doxazosin  8 mg Oral Daily  . folic acid  1 mg Oral Daily  . levothyroxine  75 mcg Oral QAC breakfast  . lisinopril  40 mg Oral Daily  . LORazepam  0-4 mg Intravenous Q6H   Followed by  . [START ON 04/02/2018] LORazepam  0-4 mg Intravenous Q12H  . metoprolol succinate  25 mg Oral Daily  . multivitamin  1 tablet Oral Daily  . pantoprazole  40 mg Oral Daily  . PARoxetine  40 mg Oral Daily  . polyvinyl alcohol  1 drop Both Eyes Daily  . potassium chloride  30 mEq Oral Q4H  . simvastatin  5 mg Oral QPM  . thiamine  100 mg Oral Daily   Or  . thiamine  100 mg Intravenous Daily   Continuous Infusions: . sodium chloride 125 mL/hr at 03/31/18 2159     LOS: 1 day     Jacquelin Hawking, MD [facility] 04/01/2018, 8:19 AM Pager: [phone]  If 7PM-7AM, please contact night-coverage [email or web address] Password [facility] 04/01/2018, 8:19 AM

## 2018-04-01 NOTE — Progress Notes (Signed)
PT note Called by nurse to room to talk with daughter.  Pt's daughter concerned due to pt c/o numbness C6 dermatome.  X-ray report appears to have conflicting information about fx therefore wanted to bring you MD attention.  Daughter also wants [facility] in [place] if possible. Thanks.  [facility] [phone] (pager)

## 2018-04-01 NOTE — Progress Notes (Signed)
Initial Nutrition Assessment  DOCUMENTATION CODES:  Not applicable  INTERVENTION:  Ensure Enlive po BID, each supplement provides 350 kcal and 20 grams of protein  Recommend diet liberalization to Regular to promote PO intake.  Gave copy of Menu and reviewed how to use  NUTRITION DIAGNOSIS:  Increased nutrient needs related to Humeral Fx as evidenced by estimated nutritional requirements for this condition  GOAL:  Patient will meet greater than or equal to 90% of their needs  MONITOR:  PO intake, Supplement acceptance, Diet advancement, Labs, Weight trends  REASON FOR ASSESSMENT:  Consult Assessment of nutrition requirement/status  ASSESSMENT:  84 y/o female PMHx etoh abuse, HLD/HTN, GERD, hypothyroidism, Depression, Afib, CAD, CKD. Presents after falling at home and suffering R humeral fx. Admitted for management.   Patient herself is an exceptionally poor historian. She only notes she "cant see, cant walk and cant hear". Her family members in the room note she has been "cranky". The patient does not think she has any changes in her PO intake or weight. She took a Vitamin D supplement at home. She did not follow any type of diet.   Per chart, her weight has been stable for the past few years. UBW appears to be 145-160 lbs  Intake records since admission reviewed. She is eating 25-50% of her meals. Patient is irritated she could not eat/drink for "2 days" due to NPO status. She "thinks" her appetite is okay. She has little to say in regards to her intake. Nursing notes they have been trying finger foods since the pt is unable to use one arm.  RD was able to retrieve a menu so that family could review options with the patient. The family noted she did not like chicken, but then patient comments she ordered chicken salad for lunch.   RD will ask for diet liberalization. Potentially, this will improve intake. She was agreeable to nutritional supplements  Physical Exam: WDL  Labs:  K:2.8, Glu:110-120 mg/d,  Meds: Folate, Vitamin D, PPI, Miralax, Thiamin, MVI, IVF,   Recent Labs  Lab 03/30/18 2159 04/01/18 0242  NA 141 139  K 3.9 2.8*  CL 108 104  CO2 18* 22  BUN 29* 14  CREATININE 1.00 0.59  CALCIUM 8.9 8.5*  GLUCOSE 110* 119*   NUTRITION - FOCUSED PHYSICAL EXAM: WDL  Diet Order:  Diet Heart Room service appropriate? Yes; Fluid consistency: Thin  EDUCATION NEEDS:  No education needs have been identified at this time  Skin:  Skin Assessment: Reviewed RN Assessment  Last BM:  4/5  Height:  Ht Readings from Last 1 Encounters:  03/30/18 5\' 5"  (1.651 m)   Weight:  Wt Readings from Last 1 Encounters:  03/30/18 160 lb (72.6 kg)   Wt Readings from Last 10 Encounters:  03/30/18 160 lb (72.6 kg)  01/17/18 153 lb (69.4 kg)  12/08/17 150 lb 9.6 oz (68.3 kg)  11/22/17 152 lb 12.8 oz (69.3 kg)  11/13/17 161 lb 2.5 oz (73.1 kg)  11/08/17 154 lb 6.4 oz (70 kg)  10/26/17 157 lb 6.4 oz (71.4 kg)  06/21/17 146 lb (66.2 kg)  05/25/17 144 lb 3.2 oz (65.4 kg)  03/30/17 154 lb 3.2 oz (69.9 kg)   Ideal Body Weight:  56.82 kg  BMI:  Body mass index is 26.63 kg/m.  Estimated Nutritional Needs:  Kcal:  1650-1800 kcals Protein:  75-85g Pro (1.3-1.5 g/kg bw) Fluid:  1.6-1.8 L fluid (65ml/kcal)  Christophe Louis RD, LDN, CNSC Clinical Nutrition Available Tues-Sat via  Pager: [phone] 04/01/2018 1:35 PM

## 2018-04-02 LAB — BASIC METABOLIC PANEL
Anion gap: 9 (ref 5–15)
BUN: 6 mg/dL (ref 6–20)
CHLORIDE: 108 mmol/L (ref 101–111)
CO2: 24 mmol/L (ref 22–32)
Calcium: 8.5 mg/dL — ABNORMAL LOW (ref 8.9–10.3)
Creatinine, Ser: 0.59 mg/dL (ref 0.44–1.00)
GFR calc Af Amer: >60 mL/min (ref >60)
GFR calc non Af Amer: >60 mL/min (ref >60)
GLUCOSE: 98 mg/dL (ref 65–99)
Potassium: 3.1 mmol/L — ABNORMAL LOW (ref 3.5–5.1)
Sodium: 141 mmol/L (ref 135–145)

## 2018-04-02 LAB — CK: Total CK: 4420 U/L — ABNORMAL HIGH (ref 38–234)

## 2018-04-02 MED ORDER — SODIUM CHLORIDE 0.9 % IV SOLN
INTRAVENOUS | Status: DC
  Administered 2018-04-02 – 2018-04-04 (×2): via INTRAVENOUS

## 2018-04-02 MED ORDER — POTASSIUM CHLORIDE CRYS ER 20 MEQ PO TBCR
40.0000 meq | EXTENDED_RELEASE_TABLET | Freq: Once | ORAL | Status: AC
  Administered 2018-04-02: 40 meq via ORAL
  Filled 2018-04-02: qty 2

## 2018-04-02 NOTE — Evaluation (Signed)
Occupational Therapy Evaluation Patient Details Name: Christina Ortiz MRN: [id number] DOB: 1929-07-08 Today's Date: 04/02/2018    History of Present Illness Christina Ortiz fell 4/4 and got her right arm wedged between the mattress and headboard. She was unable to summon help for about 2h. She does not remember the circumstances of the fall. She was brought to the ED where x-rays showed a right midshaft humerus fx and orthopedic surgery was consulted. Decided to have nonoperative managment and brace UE. PMH:  Bronchitis, PAF.   Clinical Impression   PTA Pt independent in ADL/IADL at home. Pt is currently max A for ADL due to limitations with new RUE fx. Pt educated in compensatory strategies with ADL as well as simple exercises at the hand and wrist. Very gentle PROM at the shoulder RUE with therapist this session. Pt reports RUE feeling heavy and numb. Pt provided shoulder dc handout (edited to apply to her) as a reference for compensatory strategies. OT will continue to follow acutely and Pt will require SNF level rehab post-acute stay to return to PLOF and maximize safety and independence in ADL/functional transfers.     Follow Up Recommendations  SNF;Supervision/Assistance - 24 hour    Equipment Recommendations  Other (comment)(defer to next venue)    Recommendations for Other Services       Precautions / Restrictions Precautions Precautions: Fall;Shoulder Type of Shoulder Precautions: Sling Shoulder Interventions: Shoulder sling/immobilizer;At all times Precaution Booklet Issued: Yes (comment) Precaution Comments: Shoulder DC handout provided as REFERENCE for ADL compensatory strategies. Explained the differences, and marked on worksheet. Pt verbalized udnerstanding Required Braces or Orthoses: Sling Restrictions Weight Bearing Restrictions: Yes RUE Weight Bearing: Non weight bearing      Mobility Bed Mobility               General bed mobility comments: Pt OOB in recliner when  OT entered  Transfers Overall transfer level: Needs assistance Equipment used: None;1 person hand held assist Transfers: Sit to/from Stand Sit to Stand: Min assist         General transfer comment: Pt reaching for OT or furniture for steadying asssit.     Balance Overall balance assessment: Needs assistance;History of Falls Sitting-balance support: No upper extremity supported;Feet supported Sitting balance-Leahy Scale: Fair     Standing balance support: Single extremity supported;During functional activity Standing balance-Leahy Scale: Poor Standing balance comment: relies on single UE support for balance.                            ADL either performed or assessed with clinical judgement   ADL                                         General ADL Comments: please see shoulder exercises below     Vision         Perception     Praxis      Pertinent Vitals/Pain Pain Assessment: Faces Faces Pain Scale: Hurts little more Pain Location: right UE Pain Descriptors / Indicators: Aching;Discomfort;Guarding;Grimacing Pain Intervention(s): Monitored during session;Repositioned     Hand Dominance Right   Extremity/Trunk Assessment Upper Extremity Assessment Upper Extremity Assessment: RUE deficits/detail RUE Deficits / Details: full ROM at the digits (strength 4-/5) limited wrist ROM, gentle PROM FF and abduction at the shoulder  RUE: Unable to fully assess due to immobilization RUE Sensation: decreased  light touch("feels heavy") RUE Coordination: decreased fine motor;decreased gross motor   Lower Extremity Assessment Lower Extremity Assessment: Generalized weakness   Cervical / Trunk Assessment Cervical / Trunk Assessment: Normal   Communication Communication Communication: HOH   Cognition Arousal/Alertness: Awake/alert Behavior During Therapy: WFL for tasks assessed/performed Overall Cognitive Status: Within Functional Limits for  tasks assessed                                     General Comments  Feels like arm is very heavy and weighs "20 lbs"    Exercises Exercises: Shoulder Shoulder Exercises Shoulder Flexion: PROM;Right;5 reps;Seated Shoulder ABduction: PROM;Right;5 reps;Seated Wrist Flexion: AROM;Right Wrist Extension: AROM;Right Digit Composite Flexion: AROM;Right Composite Extension: AROM;Right Neck Flexion: AROM Neck Extension: AROM Neck Lateral Flexion - Right: AROM Neck Lateral Flexion - Left: AROM   Shoulder Instructions Shoulder Instructions Donning/doffing shirt without moving shoulder: Maximal assistance Method for sponge bathing under operated UE: Patient able to independently direct caregiver Donning/doffing sling/immobilizer: Maximal assistance Correct positioning of sling/immobilizer: Maximal assistance ROM for elbow, wrist and digits of operated UE: Minimal assistance(no elbow) Sling wearing schedule (on at all times/off for ADL's): Maximal assistance Positioning of UE while sleeping: Moderate assistance    Home Living Family/patient expects to be discharged to:: Private residence Living Arrangements: Alone Available Help at Discharge: Family;Available PRN/intermittently Type of Home: Other(Comment)(condo) Home Access: Level entry     Home Layout: One level     Bathroom Shower/Tub: Producer, television/film/video: Standard     Home Equipment: Environmental consultant - 2 wheels;Cane - single point;Wheelchair - [employer];Shower seat - built in;Hand held shower head;Grab bars - tub/shower(2LO2 at night at home)   Additional Comments: Husband died last year. Equipment listed above was husbands.      Prior Functioning/Environment Level of Independence: Independent        Comments: Pt was still driving PTA.  Daughter called daily and pt would go stay with daughter during day some days and daughter checked on pt a few times a week.         OT Problem List:  Decreased range of motion;Decreased strength;Decreased activity tolerance;Impaired balance (sitting and/or standing);Decreased safety awareness;Decreased knowledge of use of DME or AE;Decreased knowledge of precautions;Impaired UE functional use;Pain      OT Treatment/Interventions: Self-care/ADL training;Therapeutic exercise;DME and/or AE instruction;Manual therapy;Modalities;Therapeutic activities;Patient/family education;Balance training    OT Goals(Current goals can be found in the care plan section) Acute Rehab OT Goals Patient Stated Goal: to get better OT Goal Formulation: With patient Time For Goal Achievement: 04/16/18 Potential to Achieve Goals: Good ADL Goals Pt Will Perform Upper Body Dressing: with mod assist;sitting Pt Will Transfer to Toilet: with supervision;ambulating Pt Will Perform Toileting - Clothing Manipulation and hygiene: with supervision;sit to/from stand Pt/caregiver will Perform Home Exercise Program: Right Upper extremity;With written HEP provided;Independently Additional ADL Goal #1: Pt will be able to recall weight bearing precautions and maintain throughout the session.  OT Frequency: Min 2X/week   Barriers to D/C: Decreased caregiver support  Pt lives alone - good support from daughter but unable to provide 24 hour care       Co-evaluation              AM-PAC PT "6 Clicks" Daily Activity     Outcome Measure Help from another person eating meals?: A Lot Help from another person taking care of personal grooming?: A Lot Help  from another person toileting, which includes using toliet, bedpan, or urinal?: A Little Help from another person bathing (including washing, rinsing, drying)?: A Lot Help from another person to put on and taking off regular upper body clothing?: A Lot Help from another person to put on and taking off regular lower body clothing?: A Lot 6 Click Score: 13   End of Session Equipment Utilized During Treatment: Gait belt Nurse  Communication: Mobility status;Other (comment)(IV check)  Activity Tolerance: Patient tolerated treatment well Patient left: in chair;with chair alarm set;with call bell/phone within reach  OT Visit Diagnosis: Unsteadiness on feet (R26.81);Other abnormalities of gait and mobility (R26.89);History of falling (Z91.81);Pain Pain - Right/Left: Right Pain - part of body: Arm                Time: 1610-9604 OT Time Calculation (min): 24 min Charges:  OT General Charges $OT Visit: 1 Visit OT Evaluation $OT Eval Moderate Complexity: 1 Mod OT Treatments $Self Care/Home Management : 8-22 mins G-Codes:     Sherryl Manges OTR/L [phone]  Evern Bio Chevie Birkhead 04/02/2018, 1:03 PM

## 2018-04-02 NOTE — NC FL2 (Signed)
[place] MEDICAID FL2 LEVEL OF CARE SCREENING TOOL     IDENTIFICATION  Patient Name: Christina Ortiz Birthdate: 1929-03-18 Sex: female Admission Date (Current Location): 03/31/2018  [facility] and [place] Number:  Producer, television/film/video and Address:  [facility], [address], [place], [place] [address]      Provider Number: [id number]  Attending Physician Name and Address:  Narda Bonds, MD  Relative Name and Phone Number:       Current Level of Care: Hospital Recommended Level of Care: Skilled Nursing Facility Prior Approval Number:    Date Approved/Denied:   PASRR Number:    Discharge Plan: SNF    Current Diagnoses: Patient Active Problem List   Diagnosis Date Noted  . Fall 03/31/2018  . Humerus shaft fracture-right 03/31/2018  . Rhabdomyolysis 03/31/2018  . Hypothermia 03/31/2018  . Healthcare maintenance 01/17/2018  . Nocturia 01/17/2018  . Macrocytosis 11/22/2017  . Acute bronchitis due to respiratory syncytial virus (RSV) 11/09/2017  . Acute respiratory failure with hypoxia (HCC) 11/09/2017  . Hypertension 11/09/2017  . PAF (paroxysmal atrial fibrillation) (HCC) 11/09/2017  . Coronary artery disease 11/09/2017  . CKD (chronic kidney disease) stage 2, GFR 60-89 ml/min 11/09/2017  . Hypothyroidism 11/09/2017  . Dyspnea 11/08/2017  . Upper respiratory tract infection 11/08/2017  . Elevated serum creatinine 06/21/2017  . Edema of both ankles 06/21/2017  . Diarrhea 05/25/2017  . Hypokalemia 05/25/2017  . Alcohol abuse 05/25/2017  . Depression, recurrent (HCC) 03/30/2017  . Lumbago 03/30/2017  . Influenza with respiratory manifestation 01/22/2017  . Coronary artery disease due to lipid rich plaque 04/04/2015  . Essential hypertension 04/04/2015  . Hyperlipidemia 04/04/2015  . Former smoker 04/04/2015  . Chronic anticoagulation 04/04/2015    Orientation RESPIRATION BLADDER Height & Weight     Self, Time, Situation, Place   Normal External catheter(Placed 4/7) Weight: 160 lb (72.6 kg) Height:  5\' 5"  (165.1 cm)  BEHAVIORAL SYMPTOMS/MOOD NEUROLOGICAL BOWEL NUTRITION STATUS      Continent Diet(Heart Healthy, thin liquids)  AMBULATORY STATUS COMMUNICATION OF NEEDS Skin   Limited Assist Verbally Normal                       Personal Care Assistance Level of Assistance  Bathing, Feeding, Dressing Bathing Assistance: Limited assistance Feeding assistance: Independent Dressing Assistance: Limited assistance     Functional Limitations Info  Sight, Hearing, Speech Sight Info: Adequate Hearing Info: Adequate Speech Info: Adequate    SPECIAL CARE FACTORS FREQUENCY  PT (By licensed PT), OT (By licensed OT)     PT Frequency: 3x OT Frequency: 3x            Contractures Contractures Info: Not present    Additional Factors Info  Code Status, Allergies Code Status Info: Full Code Allergies Info: Tape           Current Medications (04/02/2018):  This is the current hospital active medication list Current Facility-Administered Medications  Medication Dose Route Frequency Provider Last Rate Last Dose  . 0.9 %  sodium chloride infusion   Intravenous Continuous Narda Bonds, MD 100 mL/hr at 04/02/18 0800    . acetaminophen (TYLENOL) tablet 650 mg  650 mg Oral Q6H PRN Lorretta Harp, MD      . amLODipine (NORVASC) tablet 10 mg  10 mg Oral Daily Lorretta Harp, MD   10 mg at 04/02/18 0930  . cholecalciferol (VITAMIN D) tablet 1,000 Units  1,000 Units Oral QPM Lorretta Harp,  MD   1,000 Units at 03/31/18 1738  . doxazosin (CARDURA) tablet 8 mg  8 mg Oral Daily Lorretta Harp, MD   8 mg at 04/02/18 0930  . feeding supplement (ENSURE ENLIVE) (ENSURE ENLIVE) liquid 237 mL  237 mL Oral BID BM Narda Bonds, MD      . folic acid (FOLVITE) tablet 1 mg  1 mg Oral Daily Lorretta Harp, MD   1 mg at 04/02/18 0930  . hydrALAZINE (APRESOLINE) injection 5 mg  5 mg Intravenous Q2H PRN Lorretta Harp, MD      . levalbuterol Pauline Aus)  nebulizer solution 0.63 mg  0.63 mg Inhalation Q8H PRN Lorretta Harp, MD      . levothyroxine (SYNTHROID, LEVOTHROID) tablet 75 mcg  75 mcg Oral QAC breakfast Lorretta Harp, MD   75 mcg at 04/02/18 0800  . lisinopril (PRINIVIL,ZESTRIL) tablet 40 mg  40 mg Oral Daily Narda Bonds, MD   40 mg at 04/02/18 0930  . LORazepam (ATIVAN) injection 0-4 mg  0-4 mg Intravenous Q12H Lorretta Harp, MD      . LORazepam (ATIVAN) tablet 1 mg  1 mg Oral Q6H PRN Lorretta Harp, MD       Or  . LORazepam (ATIVAN) injection 1 mg  1 mg Intravenous Q6H PRN Lorretta Harp, MD   1 mg at 03/31/18 1328  . methocarbamol (ROBAXIN) tablet 500 mg  500 mg Oral Q8H PRN Lorretta Harp, MD   500 mg at 04/01/18 1804  . metoprolol succinate (TOPROL-XL) 24 hr tablet 25 mg  25 mg Oral Daily Lorretta Harp, MD   25 mg at 04/02/18 0931  . morphine 4 MG/ML injection 1 mg  1 mg Intravenous Q3H PRN Lorretta Harp, MD      . multivitamin (PROSIGHT) tablet 1 tablet  1 tablet Oral Daily Narda Bonds, MD   1 tablet at 04/02/18 0930  . ondansetron (ZOFRAN) injection 4 mg  4 mg Intravenous Q8H PRN Lorretta Harp, MD      . oxyCODONE-acetaminophen (PERCOCET/ROXICET) 5-325 MG per tablet 1 tablet  1 tablet Oral Q4H PRN Lorretta Harp, MD      . pantoprazole (PROTONIX) EC tablet 40 mg  40 mg Oral Daily Lorretta Harp, MD   40 mg at 04/02/18 0930  . PARoxetine (PAXIL) tablet 40 mg  40 mg Oral Daily Lorretta Harp, MD   40 mg at 04/01/18 0956  . polyethylene glycol (MIRALAX / GLYCOLAX) packet 17 g  17 g Oral Daily Narda Bonds, MD   17 g at 04/01/18 1805  . polyvinyl alcohol (LIQUIFILM TEARS) 1.4 % ophthalmic solution 1 drop  1 drop Both Eyes Daily Narda Bonds, MD   1 drop at 04/02/18 0934  . Rivaroxaban (XARELTO) tablet 15 mg  15 mg Oral Q supper Narda Bonds, MD   15 mg at 04/01/18 1700  . simvastatin (ZOCOR) tablet 5 mg  5 mg Oral QPM Lorretta Harp, MD   5 mg at 04/01/18 1805  . thiamine (VITAMIN B-1) tablet 100 mg  100 mg Oral Daily Lorretta Harp, MD   100 mg at 04/02/18 0930   Or   . thiamine (B-1) injection 100 mg  100 mg Intravenous Daily Lorretta Harp, MD      . zolpidem (AMBIEN) tablet 5 mg  5 mg Oral QHS PRN Lorretta Harp, MD         Discharge Medications: Please see discharge summary for a list of discharge medications.  Relevant Imaging Results:  Relevant Lab Results:   Additional Information SSN: [id number]  Maree Krabbe, LCSW

## 2018-04-02 NOTE — Progress Notes (Signed)
PROGRESS NOTE    Christina Ortiz  WIO:[id number] DOB: 1929-03-04 DOA: 03/31/2018 PCP: Julaine Fusi, NP   Brief Narrative: Christina Ortiz is a 84 y.o. female with medical history significant of Alcohol abuse, hypertension, hyperlipidemia, GERD, hypothyroidism, depression,atrial fibrillation on Xarelto, CAD, s/p stent placement. She presented after a fall while intoxicated and found to have a midshaft humeral fracture.     Assessment & Plan:   Principal Problem:   Humerus shaft fracture-right Active Problems:   Essential hypertension   Hyperlipidemia   Depression, recurrent (HCC)   Alcohol abuse   PAF (paroxysmal atrial fibrillation) (HCC)   Coronary artery disease   Fall   Rhabdomyolysis   Hypothermia   Right midshaft humeral fracture Oblique, mildly displaced. Casted by ortho tech. Outpatient follow-up. Numbness/strength improving. -Continue analgesics prn -Continue sling  Alcohol abuse Patient binge drinks. States she does not drink every day. Ethanol elevated on admission. Likely contributed heavily to fall. -CIWA  Paroxysmal atrial fibrillation Normal rate. -Continue Xarelto and Toprol XL  CAD Hyperlipidemia -Continue simvastatin  Rhabdomyolysis Secondary to fall/long lie/fracture. Trending down. -Daily CK -Continue IV fluids  Hypothermia Resolved. No evidence of infection.  Hypothyroidism -Continue Synthroid  Essential hypertension Slightly uncontrolled. -Continue amlodipine, lisinopril  Depression -Continue Paxil   DVT prophylaxis: Xarelto Code Status:   Code Status: Full Code Family Communication: None at bedside Disposition Plan: Discharge likely in 48 to 72 hours to SNF   Consultants:   Orthopedic surgery ( ortho)  Procedures:   None  Antimicrobials:  None    Subjective: Wanting to go home. Better strength in hand. Some forearm pain.  Objective: Vitals:   04/01/18 1930 04/01/18 2314 04/02/18 0343 04/02/18  0553  BP: 131/62 131/73 (!) 144/73 (!) 151/69  Pulse: 73 84 72 73  Resp: (!) 24 (!) 21 (!) 22   Temp: 98.7 F (37.1 C) 97.8 F (36.6 C) (!) 97.3 F (36.3 C)   TempSrc: Oral Oral Oral   SpO2: 95% 95% 93%   Weight:      Height:        Intake/Output Summary (Last 24 hours) at 04/02/2018 0745 Last data filed at 04/02/2018 0700 Gross per 24 hour  Intake 4098 ml  Output 3200 ml  Net 898 ml   Filed Weights   03/30/18 2156  Weight: 72.6 kg (160 lb)    Examination:  General exam: Appears calm and comfortable Respiratory system: Clear to auscultation. Respiratory effort normal. Cardiovascular system: Normal rate and irregular rhythm. Normal S1 and S2. No heart murmurs present. No extra heart sounds Gastrointestinal system: Soft, non-tender, non-distended, no guarding, no rebound, no masses felt Central nervous system: Alert and oriented. Right hand numbness with 4/5 grip strength on right which has improved compared to 5/5 on left. Right arm in a sling. Sensation  Extremities: No edema. No calf tenderness Skin: No cyanosis. No rashes Psychiatry: Judgement and insight appear normal. Mood & affect appropriate.     Data Reviewed: I have personally reviewed following labs and imaging studies  CBC: Recent Labs  Lab 03/30/18 2159  WBC 11.3*  HGB 12.7  HCT 39.9  MCV 99.3  PLT 204   Basic Metabolic Panel: Recent Labs  Lab 03/30/18 2159 04/01/18 0242 04/02/18 0157  NA 141 139 141  K 3.9 2.8* 3.1*  CL 108 104 108  CO2 18* 22 24  GLUCOSE 110* 119* 98  BUN 29* 14 6  CREATININE 1.00 0.59 0.59  CALCIUM 8.9 8.5* 8.5*   GFR: Estimated Creatinine  Clearance: 50.4 mL/min (by C-G formula based on SCr of 0.59 mg/dL). Liver Function Tests: No results for input(s): AST, ALT, ALKPHOS, BILITOT, PROT, ALBUMIN in the last 168 hours. No results for input(s): LIPASE, AMYLASE in the last 168 hours. No results for input(s): AMMONIA in the last 168 hours. Coagulation Profile: Recent Labs    Lab 03/31/18 0527  INR 1.13   Cardiac Enzymes: Recent Labs  Lab 03/30/18 2159 03/31/18 1505 04/01/18 0242 04/02/18 0157  CKTOTAL 3,046* 7,030* 6,010* 4,420*   BNP (last 3 results) No results for input(s): PROBNP in the last 8760 hours. HbA1C: No results for input(s): HGBA1C in the last 72 hours. CBG: Recent Labs  Lab 04/01/18 0748  GLUCAP 111*   Lipid Profile: No results for input(s): CHOL, HDL, LDLCALC, TRIG, CHOLHDL, LDLDIRECT in the last 72 hours. Thyroid Function Tests: No results for input(s): TSH, T4TOTAL, FREET4, T3FREE, THYROIDAB in the last 72 hours. Anemia Panel: No results for input(s): VITAMINB12, FOLATE, FERRITIN, TIBC, IRON, RETICCTPCT in the last 72 hours. Sepsis Labs: No results for input(s): PROCALCITON, LATICACIDVEN in the last 168 hours.  Recent Results (from the past 240 hour(s))  MRSA PCR Screening     Status: None   Collection Time: 03/31/18  6:00 PM  Result Value Ref Range Status   MRSA by PCR NEGATIVE NEGATIVE Final    Comment:        The GeneXpert MRSA Assay (FDA approved for NASAL specimens only), is one component of a comprehensive MRSA colonization surveillance program. It is not intended to diagnose MRSA infection nor to guide or monitor treatment for MRSA infections. Performed at [facility], [address], [place], [place] [address]          Radiology Studies: No results found.      Scheduled Meds: . amLODipine  10 mg Oral Daily  . cholecalciferol  1,000 Units Oral QPM  . doxazosin  8 mg Oral Daily  . feeding supplement (ENSURE ENLIVE)  237 mL Oral BID BM  . folic acid  1 mg Oral Daily  . levothyroxine  75 mcg Oral QAC breakfast  . lisinopril  40 mg Oral Daily  . LORazepam  0-4 mg Intravenous Q12H  . metoprolol succinate  25 mg Oral Daily  . multivitamin  1 tablet Oral Daily  . pantoprazole  40 mg Oral Daily  . PARoxetine  40 mg Oral Daily  . polyethylene glycol  17 g Oral Daily  . polyvinyl alcohol   1 drop Both Eyes Daily  . potassium chloride  40 mEq Oral Once  . Rivaroxaban  15 mg Oral Q supper  . simvastatin  5 mg Oral QPM  . thiamine  100 mg Oral Daily   Or  . thiamine  100 mg Intravenous Daily   Continuous Infusions: . sodium chloride       LOS: 2 days     Jacquelin Hawking, MD [facility] 04/02/2018, 7:45 AM Pager: [phone]  If 7PM-7AM, please contact night-coverage [email or web address] Password [email or web address] 04/02/2018, 7:45 AM

## 2018-04-02 NOTE — Clinical Social Work Note (Signed)
Clinical Social Work Assessment  Patient Details  Name: Christina Ortiz MRN: [id number] Date of Birth: December 10, 1929  Date of referral:  04/02/18               Reason for consult:  Facility Placement                Permission sought to share information with:  Family Supports Permission granted to share information::     Name::     Agricultural consultant::  [facility]  Relationship::  Daughter  Contact Information:  [phone]  Housing/Transportation Living arrangements for the past 2 months:  Single Family Home Source of Information:  Patient, Adult Children Patient Interpreter Needed:  None Criminal Activity/Legal Involvement Pertinent to Current Situation/Hospitalization:  No - Comment as needed Significant Relationships:  Adult Children Lives with:  Self Do you feel safe going back to the place where you live?  No Need for family participation in patient care:  No (Coment)  Care giving concerns:  Pt is alert and oriented. Pt was independent, home alone prior to admission. No caregiver noted at this time.   Social Worker assessment / plan:  CSW spoke with pt and pt's daughter at bedside. Pt has been to SNF before. Pt is agreeable to go to SNF at d/c. Per pt's daughter they have decided on [facility]. Pt's daughter lives in [place]. CSW encouraged pt's daughter to look for a second option in the case that [facility] is full on the day pt's dc. CSW will follow up.   Employment status:  Retired Database administrator PT Recommendations:  Skilled Nursing Facility Information / Referral to community resources:  Skilled Nursing Facility  Patient/Family's Response to care:  Pt verbalized understanding of CSW role and expressed appreciation for support. Pt denies any concern regarding pt care at this time.   Patient/Family's Understanding of and Emotional Response to Diagnosis, Current Treatment, and Prognosis: Pt understanding and realistic regarding physical  limitations. Pt understands the need for SNF placement at d/c. Pt agreeable to SNF placement at d/c, at this time. Pt's responses emotionally appropriate during conversation with CSW. Pt denies any concern regarding treatment plan at this time. CSW will continue to provide support and facilitate d/c needs.    Emotional Assessment Appearance:  Appears stated age Attitude/Demeanor/Rapport:  (Patient was appropriate.) Affect (typically observed):  Accepting, Appropriate, Calm Orientation:  Oriented to Situation, Oriented to Place, Oriented to  Time, Oriented to Self Alcohol / Substance use:  Not Applicable Psych involvement (Current and /or in the community):  No (Comment)  Discharge Needs  Concerns to be addressed:  Basic Needs, Care Coordination Readmission within the last 30 days:  No Current discharge risk:  Dependent with Mobility Barriers to Discharge:  English as a second language teacher, Continued Medical Work up   [employer], LCSW 04/02/2018, 2:09 PM

## 2018-04-03 LAB — CBC
HCT: 36 % (ref 36.0–46.0)
HEMOGLOBIN: 11.7 g/dL — AB (ref 12.0–15.0)
MCH: 32 pg (ref 26.0–34.0)
MCHC: 32.5 g/dL (ref 30.0–36.0)
MCV: 98.4 fL (ref 78.0–100.0)
Platelets: 168 10*3/uL (ref 150–400)
RBC: 3.66 MIL/uL — AB (ref 3.87–5.11)
RDW: 13.5 % (ref 11.5–15.5)
WBC: 7.9 10*3/uL (ref 4.0–10.5)

## 2018-04-03 LAB — BASIC METABOLIC PANEL
ANION GAP: 7 (ref 5–15)
BUN: 11 mg/dL (ref 6–20)
CALCIUM: 8.2 mg/dL — AB (ref 8.9–10.3)
CO2: 24 mmol/L (ref 22–32)
Chloride: 109 mmol/L (ref 101–111)
Creatinine, Ser: 0.72 mg/dL (ref 0.44–1.00)
Glucose, Bld: 94 mg/dL (ref 65–99)
Potassium: 3.2 mmol/L — ABNORMAL LOW (ref 3.5–5.1)
Sodium: 140 mmol/L (ref 135–145)

## 2018-04-03 LAB — MAGNESIUM: MAGNESIUM: 1.4 mg/dL — AB (ref 1.7–2.4)

## 2018-04-03 LAB — CK: Total CK: 3277 U/L — ABNORMAL HIGH (ref 38–234)

## 2018-04-03 MED ORDER — POTASSIUM CHLORIDE CRYS ER 20 MEQ PO TBCR
40.0000 meq | EXTENDED_RELEASE_TABLET | Freq: Once | ORAL | Status: AC
Start: 2018-04-03 — End: 2018-04-03
  Administered 2018-04-03: 40 meq via ORAL
  Filled 2018-04-03: qty 2

## 2018-04-03 MED ORDER — MAGNESIUM SULFATE 2 GM/50ML IV SOLN
2.0000 g | Freq: Once | INTRAVENOUS | Status: AC
  Administered 2018-04-03: 2 g via INTRAVENOUS
  Filled 2018-04-03: qty 50

## 2018-04-03 NOTE — Discharge Instructions (Signed)

## 2018-04-03 NOTE — Clinical Social Work Note (Signed)
[facility] has declined extending a bed offer. CSW met with patient and daughter at bedside to notify. CSW provided list of bed offers. Patient's daughter chose [facility] Patient asked if she could go home with home health. Patient's daughter told her that she gets to make that decision because she is HCPOA. CSW explained that according to RN assessment, patient is fully oriented and can make that decision. Patient's daughter repeatedly "She has dementia" multiple times with patient replying each time with "I do not!" CSW again explained that as long as she is fully oriented, she can make her own decisions and HCPOA would not be in effect until she is not fully oriented. Discussed concerns with patient returning home without going to a SNF and patient is agreeable. [place] admissions coordinator notified. CSW faxed clinicals to [facility] for authorization review. Patient's PASARR is under manual review. CSW uploaded requested documents into [place] [facility] for PASARR review.  Dayton Scrape, [place]

## 2018-04-03 NOTE — Progress Notes (Signed)
PROGRESS NOTE    Christina Ortiz  WUJ:[id number] DOB: 12-11-1929 DOA: 03/31/2018 PCP: Julaine Fusi, NP   Brief Narrative: Christina Ortiz is a 84 y.o. female with medical history significant of Alcohol abuse, hypertension, hyperlipidemia, GERD, hypothyroidism, depression,atrial fibrillation on Xarelto, CAD, s/p stent placement. She presented after a fall while intoxicated and found to have a midshaft humeral fracture.     Assessment & Plan:   Principal Problem:   Humerus shaft fracture-right Active Problems:   Essential hypertension   Hyperlipidemia   Depression, recurrent (HCC)   Alcohol abuse   PAF (paroxysmal atrial fibrillation) (HCC)   Coronary artery disease   Fall   Rhabdomyolysis   Hypothermia   Right midshaft humeral fracture Oblique, mildly displaced. Casted by ortho tech. Outpatient follow-up. Numbness/strength improving. -Continue analgesics prn -Continue sling  Alcohol abuse Patient binge drinks. States she does not drink every day. Ethanol elevated on admission. Likely contributed heavily to fall. Low CIWA scores. -CIWA  Paroxysmal atrial fibrillation Normal rate. -Continue Xarelto and Toprol XL  CAD Hyperlipidemia -Continue simvastatin  Rhabdomyolysis Secondary to fall/long lie/fracture. Peak of ~7000/ Trending down. -Daily CK -Continue IV fluids  Hypothermia Resolved. No evidence of infection.  Hypothyroidism -Continue Synthroid  Essential hypertension Slightly uncontrolled. -Continue amlodipine, lisinopril  Depression -Continue Paxil  Hypokalemia In setting of hypomagnesemia -replete magnesium  Hypomagnesemia -replete   DVT prophylaxis: Xarelto Code Status:   Code Status: Full Code Family Communication: None at bedside Disposition Plan: Discharge likely in 24-48 hours to SNF   Consultants:   Orthopedic surgery ([place] [facility])  Procedures:   None  Antimicrobials:  None    Subjective: Patient with  improved strength and sensation of her right hand. Wants to take a shower.  Objective: Vitals:   04/02/18 1940 04/02/18 2318 04/03/18 0300 04/03/18 0709  BP: 107/64 105/64 (!) 143/96 (!) 160/70  Pulse: 68 65 67 73  Resp: (!) 23 (!) 21 (!) 24 (!) 21  Temp: 98.6 F (37 C) (!) 97.3 F (36.3 C) (!) 97.3 F (36.3 C) 97.7 F (36.5 C)  TempSrc: Oral Oral Oral Oral  SpO2: 96% 96% 91% 91%  Weight:      Height:        Intake/Output Summary (Last 24 hours) at 04/03/2018 1002 Last data filed at 04/03/2018 0937 Gross per 24 hour  Intake 3530 ml  Output 3050 ml  Net 480 ml   Filed Weights   03/30/18 2156  Weight: 72.6 kg (160 lb)    Examination:  General exam: Appears calm and comfortable Respiratory: Clear to auscultation bilaterally. Unlabored work of breathing. No wheezing or rales. Cardiovascular system: Normal rate and irregular rhythm. Normal S1 and S2. No heart murmurs present. No extra heart sounds Gastrointestinal system: Soft, non-tender, non-distended, no guarding, no rebound, no masses felt Central nervous system: Alert and oriented. Good sensation of right hand with 4-5/5 grip strength Extremities: No edema. No calf tenderness Skin: No cyanosis. No rashes Psychiatry: Judgement and insight appear normal. Mood & affect appropriate.     Data Reviewed: I have personally reviewed following labs and imaging studies  CBC: Recent Labs  Lab 03/30/18 2159 04/03/18 0244  WBC 11.3* 7.9  HGB 12.7 11.7*  HCT 39.9 36.0  MCV 99.3 98.4  PLT 204 168   Basic Metabolic Panel: Recent Labs  Lab 03/30/18 2159 04/01/18 0242 04/02/18 0157 04/03/18 0244  NA 141 139 141 140  K 3.9 2.8* 3.1* 3.2*  CL 108 104 108 109  CO2 18* 22  24 24  GLUCOSE 110* 119* 98 94  BUN 29* 14 6 11   CREATININE 1.00 0.59 0.59 0.72  CALCIUM 8.9 8.5* 8.5* 8.2*  MG  --   --   --  1.4*   GFR: Estimated Creatinine Clearance: 50.4 mL/min (by C-G formula based on SCr of 0.72 mg/dL). Liver Function  Tests: No results for input(s): AST, ALT, ALKPHOS, BILITOT, PROT, ALBUMIN in the last 168 hours. No results for input(s): LIPASE, AMYLASE in the last 168 hours. No results for input(s): AMMONIA in the last 168 hours. Coagulation Profile: Recent Labs  Lab 03/31/18 0527  INR 1.13   Cardiac Enzymes: Recent Labs  Lab 03/30/18 2159 03/31/18 1505 04/01/18 0242 04/02/18 0157 04/03/18 0244  CKTOTAL 3,046* 7,030* 6,010* 4,420* 3,277*   BNP (last 3 results) No results for input(s): PROBNP in the last 8760 hours. HbA1C: No results for input(s): HGBA1C in the last 72 hours. CBG: Recent Labs  Lab 04/01/18 0748  GLUCAP 111*   Lipid Profile: No results for input(s): CHOL, HDL, LDLCALC, TRIG, CHOLHDL, LDLDIRECT in the last 72 hours. Thyroid Function Tests: No results for input(s): TSH, T4TOTAL, FREET4, T3FREE, THYROIDAB in the last 72 hours. Anemia Panel: No results for input(s): VITAMINB12, FOLATE, FERRITIN, TIBC, IRON, RETICCTPCT in the last 72 hours. Sepsis Labs: No results for input(s): PROCALCITON, LATICACIDVEN in the last 168 hours.  Recent Results (from the past 240 hour(s))  MRSA PCR Screening     Status: None   Collection Time: 03/31/18  6:00 PM  Result Value Ref Range Status   MRSA by PCR NEGATIVE NEGATIVE Final    Comment:        The GeneXpert MRSA Assay (FDA approved for NASAL specimens only), is one component of a comprehensive MRSA colonization surveillance program. It is not intended to diagnose MRSA infection nor to guide or monitor treatment for MRSA infections. Performed at [facility], [address], [place], [place] [address]          Radiology Studies: No results found.      Scheduled Meds: . amLODipine  10 mg Oral Daily  . cholecalciferol  1,000 Units Oral QPM  . doxazosin  8 mg Oral Daily  . feeding supplement (ENSURE ENLIVE)  237 mL Oral BID BM  . folic acid  1 mg Oral Daily  . levothyroxine  75 mcg Oral QAC breakfast  .  lisinopril  40 mg Oral Daily  . LORazepam  0-4 mg Intravenous Q12H  . metoprolol succinate  25 mg Oral Daily  . multivitamin  1 tablet Oral Daily  . pantoprazole  40 mg Oral Daily  . PARoxetine  40 mg Oral Daily  . polyethylene glycol  17 g Oral Daily  . polyvinyl alcohol  1 drop Both Eyes Daily  . Rivaroxaban  15 mg Oral Q supper  . simvastatin  5 mg Oral QPM  . thiamine  100 mg Oral Daily   Or  . thiamine  100 mg Intravenous Daily   Continuous Infusions: . sodium chloride 100 mL/hr at 04/03/18 0800     LOS: 3 days     Jacquelin Hawking, MD [facility] 04/03/2018, 10:02 AM Pager: [phone]  If 7PM-7AM, please contact night-coverage [email or web address] Password [email or web address] 04/03/2018, 10:02 AM

## 2018-04-03 NOTE — Progress Notes (Signed)
Physical Therapy Treatment Patient Details Name: Christina Ortiz MRN: [id number] DOB: 09/21/1929 Today's Date: 04/03/2018    History of Present Illness Athira fell 4/4 and got her right arm wedged between the mattress and headboard. She was unable to summon help for about 2h. She does not remember the circumstances of the fall. She was brought to the ED where x-rays showed a right midshaft humerus fx and orthopedic surgery was consulted. Decided to have nonoperative managment and brace UE. PMH:  recent Bronchitis, PAF, HTN, depression, CAD s/p PCI/stent, CKD, heart block (Mobitz type 1).      PT Comments    Pt is progressing well with gait, min to min guard hand held assist down the hallway. Daughter was present today to observe session. Reinforced edema management education and added ankle pumps.  It would be worth trying a cane for increased independence next session. Pt remains appropriate for SNF level rehab  Follow Up Recommendations  SNF;Supervision/Assistance - 24 hour     Equipment Recommendations  None recommended by PT    Recommendations for Other Services   NA     Precautions / Restrictions Precautions Precautions: Fall;Shoulder Type of Shoulder Precautions: Sling Shoulder Interventions: Shoulder sling/immobilizer;At all times Required Braces or Orthoses: Sling Restrictions RUE Weight Bearing: Non weight bearing    Mobility  Bed Mobility Overal bed mobility: Needs Assistance Bed Mobility: Sit to Supine       Sit to supine: Supervision   General bed mobility comments: Pt using gravity and momentum to get feet and trunk positioned in bed, verbal cues to not WB through R UE during transition to supine.   Transfers Overall transfer level: Needs assistance Equipment used: 1 person hand held assist Transfers: Sit to/from Stand Sit to Stand: Min guard         General transfer comment: Min guard assist for safety to steady trunk.  Verbal cues to stand for a few  seconds before walking to ensure that she was not light headed (seated BP was a bit soft-see vitals flow sheet).   Ambulation/Gait Ambulation/Gait assistance: Min assist;Min guard Ambulation Distance (Feet): 150 Feet Assistive device: 1 person hand held assist Gait Pattern/deviations: Step-to pattern;Step-through pattern;Shuffle;Staggering left;Staggering right;Drifts right/left Gait velocity: decreased   General Gait Details: Min to min guard hand held assist for balance started min and then progressed to min guard.  Very slight antalgic gait pattern favoring sore R foot/calf (seemed to be R big toe).            Balance Overall balance assessment: Needs assistance Sitting-balance support: Single extremity supported Sitting balance-Leahy Scale: Fair Sitting balance - Comments: Pt required L hand to pull to sitting off of back of recliner chair.    Standing balance support: Single extremity supported Standing balance-Leahy Scale: Poor Standing balance comment: min to min guard assist, generally                            Cognition Arousal/Alertness: Awake/alert Behavior During Therapy: WFL for tasks assessed/performed Overall Cognitive Status: History of cognitive impairments - at baseline                                 General Comments: Daughter joking about dementia (not listed in PMH).        Exercises      General Comments General comments (skin integrity, edema, etc.): Encouraged continued finger wiggles/R  wrist movement as well as ankle pumps in bed.       Pertinent Vitals/Pain Pain Assessment: 0-10 Pain Score: 3  Pain Location: right UE Pain Descriptors / Indicators: Aching;Discomfort;Guarding;Grimacing Pain Intervention(s): Monitored during session;Limited activity within patient's tolerance;Repositioned           PT Goals (current goals can now be found in the care plan section) Acute Rehab PT Goals Patient Stated Goal: to get  better Progress towards PT goals: Progressing toward goals    Frequency    Min 2X/week      PT Plan Frequency needs to be updated       AM-PAC PT "6 Clicks" Daily Activity  Outcome Measure  Difficulty turning over in bed (including adjusting bedclothes, sheets and blankets)?: Unable Difficulty moving from lying on back to sitting on the side of the bed? : Unable Difficulty sitting down on and standing up from a chair with arms (e.g., wheelchair, bedside commode, etc,.)?: Unable Help needed moving to and from a bed to chair (including a wheelchair)?: A Little Help needed walking in hospital room?: A Little Help needed climbing 3-5 steps with a railing? : A Little 6 Click Score: 12    End of Session Equipment Utilized During Treatment: Other (comment)(shoulder sling) Activity Tolerance: Patient limited by fatigue;Patient limited by pain Patient left: in bed;with call bell/phone within reach;with bed alarm set;with family/visitor present Nurse Communication: Mobility status;Other (comment)(R calf and R foot pain with mobility) PT Visit Diagnosis: Unsteadiness on feet (R26.81);Muscle weakness (generalized) (M62.81);Pain Pain - Right/Left: Right Pain - part of body: Shoulder;Hand;Arm     Time: 7078-6754 PT Time Calculation (min) (ACUTE ONLY): 29 min  Charges:  $Gait Training: 8-22 mins $Therapeutic Activity: 8-22 mins          Vicky Schleich B. Mairyn Lenahan, PT, DPT [phone]            04/03/2018, 2:58 PM

## 2018-04-04 DIAGNOSIS — I480 Paroxysmal atrial fibrillation: Secondary | ICD-10-CM | POA: Diagnosis not present

## 2018-04-04 DIAGNOSIS — I10 Essential (primary) hypertension: Secondary | ICD-10-CM | POA: Diagnosis not present

## 2018-04-04 DIAGNOSIS — R0609 Other forms of dyspnea: Secondary | ICD-10-CM | POA: Diagnosis not present

## 2018-04-04 DIAGNOSIS — T68XXXA Hypothermia, initial encounter: Secondary | ICD-10-CM | POA: Diagnosis not present

## 2018-04-04 DIAGNOSIS — Z23 Encounter for immunization: Secondary | ICD-10-CM | POA: Diagnosis present

## 2018-04-04 DIAGNOSIS — M79601 Pain in right arm: Secondary | ICD-10-CM | POA: Diagnosis not present

## 2018-04-04 DIAGNOSIS — W06XXXA Fall from bed, initial encounter: Secondary | ICD-10-CM | POA: Diagnosis not present

## 2018-04-04 DIAGNOSIS — J9601 Acute respiratory failure with hypoxia: Secondary | ICD-10-CM | POA: Diagnosis not present

## 2018-04-04 DIAGNOSIS — M6281 Muscle weakness (generalized): Secondary | ICD-10-CM | POA: Diagnosis not present

## 2018-04-04 DIAGNOSIS — F1010 Alcohol abuse, uncomplicated: Secondary | ICD-10-CM

## 2018-04-04 DIAGNOSIS — S42331A Displaced oblique fracture of shaft of humerus, right arm, initial encounter for closed fracture: Secondary | ICD-10-CM | POA: Diagnosis not present

## 2018-04-04 DIAGNOSIS — K219 Gastro-esophageal reflux disease without esophagitis: Secondary | ICD-10-CM | POA: Diagnosis not present

## 2018-04-04 DIAGNOSIS — J309 Allergic rhinitis, unspecified: Secondary | ICD-10-CM | POA: Diagnosis not present

## 2018-04-04 DIAGNOSIS — Z9181 History of falling: Secondary | ICD-10-CM | POA: Diagnosis not present

## 2018-04-04 DIAGNOSIS — S42301A Unspecified fracture of shaft of humerus, right arm, initial encounter for closed fracture: Secondary | ICD-10-CM | POA: Diagnosis not present

## 2018-04-04 DIAGNOSIS — J205 Acute bronchitis due to respiratory syncytial virus: Secondary | ICD-10-CM | POA: Diagnosis not present

## 2018-04-04 DIAGNOSIS — E039 Hypothyroidism, unspecified: Secondary | ICD-10-CM | POA: Diagnosis not present

## 2018-04-04 DIAGNOSIS — Y92092 Bedroom in other non-institutional residence as the place of occurrence of the external cause: Secondary | ICD-10-CM | POA: Diagnosis not present

## 2018-04-04 DIAGNOSIS — S42301D Unspecified fracture of shaft of humerus, right arm, subsequent encounter for fracture with routine healing: Secondary | ICD-10-CM | POA: Diagnosis not present

## 2018-04-04 DIAGNOSIS — E785 Hyperlipidemia, unspecified: Secondary | ICD-10-CM | POA: Diagnosis not present

## 2018-04-04 DIAGNOSIS — Z4789 Encounter for other orthopedic aftercare: Secondary | ICD-10-CM | POA: Diagnosis not present

## 2018-04-04 DIAGNOSIS — R2689 Other abnormalities of gait and mobility: Secondary | ICD-10-CM | POA: Diagnosis not present

## 2018-04-04 DIAGNOSIS — G8911 Acute pain due to trauma: Secondary | ICD-10-CM | POA: Diagnosis not present

## 2018-04-04 LAB — BASIC METABOLIC PANEL
Anion gap: 9 (ref 5–15)
BUN: 15 mg/dL (ref 6–20)
CALCIUM: 8.6 mg/dL — AB (ref 8.9–10.3)
CO2: 23 mmol/L (ref 22–32)
CREATININE: 0.63 mg/dL (ref 0.44–1.00)
Chloride: 109 mmol/L (ref 101–111)
GFR calc Af Amer: >60 mL/min (ref >60)
GLUCOSE: 94 mg/dL (ref 65–99)
Potassium: 3.6 mmol/L (ref 3.5–5.1)
Sodium: 141 mmol/L (ref 135–145)

## 2018-04-04 LAB — CK: CK TOTAL: 2076 U/L — AB (ref 38–234)

## 2018-04-04 MED ORDER — THIAMINE HCL 100 MG PO TABS: 100.0000 mg | ORAL_TABLET | Freq: Every day | ORAL | 0 refills | Status: DC

## 2018-04-04 MED ORDER — OXYCODONE-ACETAMINOPHEN 5-325 MG PO TABS: 1.0000 | ORAL_TABLET | ORAL | 0 refills | Status: DC | PRN

## 2018-04-04 MED ORDER — FOLIC ACID 1 MG PO TABS: 1.0000 mg | ORAL_TABLET | Freq: Every day | ORAL | 0 refills | Status: DC

## 2018-04-04 MED ORDER — POLYETHYLENE GLYCOL 3350 17 G PO PACK: 17.0000 g | PACK | Freq: Every day | ORAL | 0 refills | Status: DC

## 2018-04-04 NOTE — Clinical Social Work Note (Addendum)
Insurance authorization obtained: [id number], rug level RVB. CSW sent information to SNF admissions coordinator. PASARR still pending.  Charlynn Court, CSW [phone]  10:21 am PASARR obtained: [id number].  Charlynn Court, CSW [phone]  10:57 am CSW notified patient of plan for discharge to [facility] today. She requested that CSW call her daughter to see if she preferred her go by [facility] or if she wanted to drive her to the facility. CSW left daughter a Engineer, technical sales.  Charlynn Court, CSW [phone]

## 2018-04-04 NOTE — Clinical Social Work Note (Signed)
CSW facilitated patient discharge including contacting patient family and facility to confirm patient discharge plans. Clinical information faxed to facility and family agreeable with plan. CSW arranged ambulance transport via [facility] to [facility]. RN to call report prior to discharge [phone] Room [phone]).  CSW will sign off for now as social work intervention is no longer needed. Please consult [place] again if new needs arise.  Charlynn Court, CSW [phone]

## 2018-04-04 NOTE — Care Management Important Message (Signed)
Important Message  Patient Details  Name: Christina Ortiz MRN: [id number] Date of Birth: 18-May-1929   Medicare Important Message Given:  Yes    Christina Ortiz 04/04/2018, 12:35 PM

## 2018-04-04 NOTE — Clinical Social Work Placement (Signed)
   CLINICAL SOCIAL WORK PLACEMENT  NOTE  Date:  04/04/2018  Patient Details  Name: Christina Ortiz MRN: [id number] Date of Birth: September 06, 1929  Clinical Social Work is seeking post-discharge placement for this patient at the Skilled  Nursing Facility level of care (*CSW will initial, date and re-position this form in  chart as items are completed):      Patient/family provided with [facility] Clinical Social Work Department's list of facilities offering this level of care within the geographic area requested by the patient (or if unable, by the patient's family).  Yes   Patient/family informed of their freedom to choose among providers that offer the needed level of care, that participate in Medicare, Medicaid or managed care program needed by the patient, have an available bed and are willing to accept the patient.      Patient/family informed of [facility]'s ownership interest in [facility] and [facility], as well as of the fact that they are under no obligation to receive care at these facilities.  PASRR submitted to EDS on 04/02/18     PASRR number received on 04/04/18     Existing PASRR number confirmed on       FL2 transmitted to all facilities in geographic area requested by pt/family on 04/02/18     FL2 transmitted to all facilities within larger geographic area on       Patient informed that his/her managed care company has contracts with or will negotiate with certain facilities, including the following:        Yes   Patient/family informed of bed offers received.  Patient chooses bed at [facility]     Physician recommends and patient chooses bed at      Patient to be transferred to [facility] on 04/04/18.  Patient to be transferred to facility by [facility]     Patient family notified on 04/04/18 of transfer.  Name of family member notified:  [name]     PHYSICIAN Please prepare prescriptions     Additional Comment:     _______________________________________________ Margarito Liner, LCSW 04/04/2018, 11:18 AM

## 2018-04-04 NOTE — Discharge Summary (Signed)
Physician Discharge Summary  Christina Ortiz WGN:[id number] DOB: [birth year] DOA: 03/31/2018  PCP: Julaine Fusi, NP  Admit date: 03/31/2018 Discharge date: 04/04/2018  Admitted From: Home  Disposition:  SNF  Recommendations for Outpatient Follow-up:  1. Follow up with PCP in 1-2 weeks 2. Please obtain BMP/CBC in one week Please follow up with Dr Marin Comment in 1 week.  Check Mg level in 48 hours.    Discharge Condition: stable.  CODE STATUS: full code.l  Diet recommendation: Heart Healthy  Brief/Interim Summary: Brief Narrative: Christina Ortiz is a 84 y.o. femalewith medical history significant ofAlcohol abuse, hypertension, hyperlipidemia, GERD, hypothyroidism, depression,atrial fibrillation on Xarelto, CAD, s/p stent placement. She presented after a fall while intoxicated and found to have a midshaft humeral fracture.     Assessment & Plan:   Principal Problem:   Humerus shaft fracture-right Active Problems:   Essential hypertension   Hyperlipidemia   Depression, recurrent (HCC)   Alcohol abuse   PAF (paroxysmal atrial fibrillation) (HCC)   Coronary artery disease   Fall   Rhabdomyolysis   Hypothermia   Right midshaft humeral fracture Oblique, mildly displaced. Casted by ortho tech. Outpatient follow-up in 1 week.  -Continue analgesics prn -Continue sling  Alcohol abuse Patient binge drinks. States she does not drink every day. Ethanol elevated on admission. Likely contributed heavily to fall. -CIWA score 0.  Discharge on thiamine and folate.   Paroxysmal atrial fibrillation Normal rate. -Continue Xarelto and Toprol XL  CAD Hyperlipidemia -Continue simvastatin  Rhabdomyolysis Secondary to fall/long lie/fracture. Peak of ~7000/ Trending down. -Daily CK -treated with IV fluids  Hypothermia Resolved. No evidence of infection.  Hypothyroidism -Continue Synthroid  Essential hypertension Slightly uncontrolled. -Continue amlodipine,  quinopril   Depression -Continue Paxil  Hypokalemia In setting of hypomagnesemia -replete magnesium resolved. Resume oral kcl home dose.   Hypomagnesemia -replete Follow labs outpatient.    Discharge Diagnoses:  Principal Problem:   Humerus shaft fracture-right Active Problems:   Essential hypertension   Hyperlipidemia   Depression, recurrent (HCC)   Alcohol abuse   PAF (paroxysmal atrial fibrillation) (HCC)   Coronary artery disease   Fall   Rhabdomyolysis   Hypothermia    Discharge Instructions  Discharge Instructions    Diet - low sodium heart healthy   Complete by:  As directed    Increase activity slowly   Complete by:  As directed      Allergies as of 04/04/2018      Reactions   Tape Other (See Comments)   Patient's skin is thin and will TEAR AND BRUISE EASILY      Medication List    STOP taking these medications   benzonatate 100 MG capsule Commonly known as:  TESSALON   fluticasone 50 MCG/ACT nasal spray Commonly known as:  FLONASE   levalbuterol 45 MCG/ACT inhaler Commonly known as:  XOPENEX HFA     TAKE these medications   amLODipine 10 MG tablet Commonly known as:  NORVASC Take 1 tablet (10 mg total) by mouth daily.   cholecalciferol 1000 units tablet Commonly known as:  VITAMIN D Take 1,000 Units by mouth every evening.   doxazosin 8 MG tablet Commonly known as:  CARDURA TAKE 1 TABLET (8 MG TOTAL) BY MOUTH DAILY.   folic acid 1 MG tablet Commonly known as:  FOLVITE Take 1 tablet (1 mg total) by mouth daily. Start taking on:  04/05/2018   levalbuterol 0.63 MG/3ML nebulizer solution Commonly known as:  XOPENEX INHALE THE CONTENTS OF 1  VIAL VIA NEBULIZER EVERY 8 HOURS AS NEEDED FOR WHEEZING OR SHORTNESS OF BREATH.   levothyroxine 75 MCG tablet Commonly known as:  SYNTHROID, LEVOTHROID Take 1 tablet (75 mcg total) by mouth daily before breakfast.   metoprolol succinate 25 MG 24 hr tablet Commonly known as:  TOPROL-XL TAKE  1 TABLET BY MOUTH DAILY.   omeprazole 20 MG capsule Commonly known as:  PRILOSEC Take 1 capsule (20 mg total) by mouth daily as needed (heartburn). What changed:  when to take this   oxyCODONE-acetaminophen 5-325 MG tablet Commonly known as:  PERCOCET/ROXICET Take 1 tablet by mouth every 4 (four) hours as needed for moderate pain.   PARoxetine 40 MG tablet Commonly known as:  PAXIL Take 1 tablet (40 mg total) by mouth daily.   polyethylene glycol packet Commonly known as:  MIRALAX / GLYCOLAX Take 17 g by mouth daily. Start taking on:  04/05/2018   potassium chloride 10 MEQ tablet Commonly known as:  K-DUR TAKE 1 TABLET (10 MEQ TOTAL) BY MOUTH DAILY. What changed:  Another medication with the same name was removed. Continue taking this medication, and follow the directions you see here.   PRESERVISION AREDS 2 PO Take 1 tablet by mouth daily.   quinapril 40 MG tablet Commonly known as:  ACCUPRIL TAKE 1 TABLET (40 MG TOTAL) BY MOUTH DAILY.   simvastatin 5 MG tablet Commonly known as:  ZOCOR Take 1 tablet (5 mg total) by mouth every evening. What changed:  Another medication with the same name was removed. Continue taking this medication, and follow the directions you see here.   SYSTANE BALANCE 0.6 % Soln Generic drug:  Propylene Glycol Place 1 drop into both eyes daily.   thiamine 100 MG tablet Take 1 tablet (100 mg total) by mouth daily. Start taking on:  04/05/2018   XARELTO 15 MG Tabs tablet Generic drug:  Rivaroxaban TAKE 1 TABLET (15 MG TOTAL) BY MOUTH DAILY WITH SUPPER.       Contact information for follow-up providers    Yolonda Kida, MD Follow up in 1 week(s).   Specialty:  Orthopedic Surgery Contact information: [address] [place] [address] [phone]            Contact information for after-discharge care    Destination    [facility] .   Service:  Skilled Nursing Contact information: [address] [place] [address] [phone]                 Allergies  Allergen Reactions  . Tape Other (See Comments)    Patient's skin is thin and will TEAR AND BRUISE EASILY    Consultations:  Ortho    Procedures/Studies: Dg Shoulder Right  Result Date: 03/31/2018 CLINICAL DATA:  Pain EXAM: RIGHT SHOULDER - 2+ VIEW COMPARISON:  11/08/2015 FINDINGS: Similar appearance of slightly widened AC joint. Old fracture deformity involving the proximal to midshaft of the humerus. No definite acute abnormality is seen. IMPRESSION: 1. Old fracture deformity involving the shaft of the humerus. 2. No definite acute osseous abnormality is seen. Electronically Signed   By: Jasmine Pang M.D.   On: 03/31/2018 02:21   Dg Forearm Right  Result Date: 03/30/2018 CLINICAL DATA:  Right arm pain after fall EXAM: RIGHT FOREARM - 2 VIEW COMPARISON:  Elbow radiograph 11/08/2015 FINDINGS: No acute displaced fracture or malalignment. Soft tissues are unremarkable. IMPRESSION: No acute osseous abnormality Electronically Signed   By: Adrian Prows.D.  On: 03/30/2018 22:44   Ct Head Wo Contrast  Result Date: 03/30/2018 CLINICAL DATA:  84 y/o  F; found on floor. EXAM: CT HEAD WITHOUT CONTRAST CT CERVICAL SPINE WITHOUT CONTRAST TECHNIQUE: Multidetector CT imaging of the head and cervical spine was performed following the standard protocol without intravenous contrast. Multiplanar CT image reconstructions of the cervical spine were also generated. COMPARISON:  05/22/2017 CT head FINDINGS: CT HEAD FINDINGS Brain: Stable very small chronic infarction within the right cerebellar hemisphere in tiny chronic lacunar infarcts within the basal ganglia. Stable chronic microvascular ischemic changes and parenchymal volume loss of the brain. No evidence for acute stroke, hemorrhage, focal mass effect of the brain, hydrocephalus, extra-axial collection, or effacement of basilar cisterns. Vascular: Calcific  atherosclerosis of carotid siphons. Skull: Normal. Negative for fracture or focal lesion. Sinuses/Orbits: Stable left mastoid tip sclerosis. Moderate left and mild right maxillary sinus mucosal thickening. Aerosolized secretions in the right sphenoid sinus. Bilateral intra-ocular lens replacement. Other: None. CT CERVICAL SPINE FINDINGS Motion artifact greatest at the C5-C7 levels. Alignment: Straightening of cervical lordosis. C7-T1 grade 1 anterolisthesis secondary to facet hypertrophy. Skull base and vertebrae: No acute fracture. No primary bone lesion or focal pathologic process. Soft tissues and spinal canal: No prevertebral fluid or swelling. No visible canal hematoma. Disc levels: Cervical spondylosis with advanced discogenic degenerative changes at the C5-C7 levels and facet hypertrophy which is severe on the left. Left-sided uncovertebral hypertrophy and facet hypertrophy results in bony foraminal stenosis at the C3 through C7 levels. There is right-sided bony foraminal stenosis primarily at C6-7. No high-grade canal stenosis. Upper chest: Negative. Other: Calcific atherosclerosis of carotid siphons. IMPRESSION: 1. No acute intracranial abnormality or calvarial fracture. 2. Motion degraded cervical spine CT. No acute fracture or malalignment identified. 3. Stable chronic microvascular ischemic changes, parenchymal volume loss, and small chronic infarcts in the brain. 4. Paranasal sinus disease with aerosolized secretions in the right sphenoid sinus which may represent acute sinusitis. 5. Cervical spondylosis with disc and facet degenerative changes greatest at C5-C7 levels. No high-grade bony canal stenosis. Electronically Signed   By: Mitzi Hansen M.D.   On: 03/30/2018 22:53   Ct Cervical Spine Wo Contrast  Result Date: 03/30/2018 CLINICAL DATA:  84 y/o  F; found on floor. EXAM: CT HEAD WITHOUT CONTRAST CT CERVICAL SPINE WITHOUT CONTRAST TECHNIQUE: Multidetector CT imaging of the head and  cervical spine was performed following the standard protocol without intravenous contrast. Multiplanar CT image reconstructions of the cervical spine were also generated. COMPARISON:  05/22/2017 CT head FINDINGS: CT HEAD FINDINGS Brain: Stable very small chronic infarction within the right cerebellar hemisphere in tiny chronic lacunar infarcts within the basal ganglia. Stable chronic microvascular ischemic changes and parenchymal volume loss of the brain. No evidence for acute stroke, hemorrhage, focal mass effect of the brain, hydrocephalus, extra-axial collection, or effacement of basilar cisterns. Vascular: Calcific atherosclerosis of carotid siphons. Skull: Normal. Negative for fracture or focal lesion. Sinuses/Orbits: Stable left mastoid tip sclerosis. Moderate left and mild right maxillary sinus mucosal thickening. Aerosolized secretions in the right sphenoid sinus. Bilateral intra-ocular lens replacement. Other: None. CT CERVICAL SPINE FINDINGS Motion artifact greatest at the C5-C7 levels. Alignment: Straightening of cervical lordosis. C7-T1 grade 1 anterolisthesis secondary to facet hypertrophy. Skull base and vertebrae: No acute fracture. No primary bone lesion or focal pathologic process. Soft tissues and spinal canal: No prevertebral fluid or swelling. No visible canal hematoma. Disc levels: Cervical spondylosis with advanced discogenic degenerative changes at the C5-C7 levels and facet  hypertrophy which is severe on the left. Left-sided uncovertebral hypertrophy and facet hypertrophy results in bony foraminal stenosis at the C3 through C7 levels. There is right-sided bony foraminal stenosis primarily at C6-7. No high-grade canal stenosis. Upper chest: Negative. Other: Calcific atherosclerosis of carotid siphons. IMPRESSION: 1. No acute intracranial abnormality or calvarial fracture. 2. Motion degraded cervical spine CT. No acute fracture or malalignment identified. 3. Stable chronic microvascular  ischemic changes, parenchymal volume loss, and small chronic infarcts in the brain. 4. Paranasal sinus disease with aerosolized secretions in the right sphenoid sinus which may represent acute sinusitis. 5. Cervical spondylosis with disc and facet degenerative changes greatest at C5-C7 levels. No high-grade bony canal stenosis. Electronically Signed   By: Mitzi Hansen M.D.   On: 03/30/2018 22:53   Dg Humerus Right  Result Date: 03/31/2018 CLINICAL DATA:  Fall EXAM: RIGHT HUMERUS - 2+ VIEW COMPARISON:  None. FINDINGS: There is a mildly displaced oblique fracture of the midshaft of the right humerus. Direction of displacement is impossible to determine because of positioning. IMPRESSION: Mildly displaced oblique fracture of the humerus. Electronically Signed   By: Deatra Robinson M.D.   On: 03/31/2018 02:17   Dg Hand Complete Right  Result Date: 03/31/2018 CLINICAL DATA:  Fall EXAM: RIGHT HAND - COMPLETE 3+ VIEW COMPARISON:  None. FINDINGS: Mid acute fracture or dislocation of the right hand. The bones are osteopenic. Mild osteoarthrosis, predominantly of the first carpometacarpal joint and interphalangeal joints of the fifth digit. IMPRESSION: No fracture or dislocation of the right hand. Electronically Signed   By: Deatra Robinson M.D.   On: 03/31/2018 02:19       Subjective: Feeling ok, no new complaints. Denies dyspnea.   Discharge Exam: Vitals:   04/04/18 0325 04/04/18 0752  BP: 139/64 (!) 149/83  Pulse: 62 80  Resp: 16 16  Temp: 98.3 F (36.8 C) 98.3 F (36.8 C)  SpO2: 97% 97%   Vitals:   04/03/18 1955 04/03/18 2329 04/04/18 0325 04/04/18 0752  BP: (!) 119/58 (!) 141/77 139/64 (!) 149/83  Pulse: 72 69 62 80  Resp: 18 (!) 24 16 16   Temp: 97.8 F (36.6 C) 98 F (36.7 C) 98.3 F (36.8 C) 98.3 F (36.8 C)  TempSrc: Oral Oral Oral Oral  SpO2:  97% 97% 97%  Weight:      Height:        General: Pt is alert, awake, not in acute distress Cardiovascular: RRR, S1/S2 +, no  rubs, no gallops Respiratory: CTA bilaterally, no wheezing, no rhonchi Abdominal: Soft, NT, ND, bowel sounds + Extremities: no edema, no cyanosis , right arm on sling cast.     The results of significant diagnostics from this hospitalization (including imaging, microbiology, ancillary and laboratory) are listed below for reference.     Microbiology: Recent Results (from the past 240 hour(s))  MRSA PCR Screening     Status: None   Collection Time: 03/31/18  6:00 PM  Result Value Ref Range Status   MRSA by PCR NEGATIVE NEGATIVE Final    Comment:        The GeneXpert MRSA Assay (FDA approved for NASAL specimens only), is one component of a comprehensive MRSA colonization surveillance program. It is not intended to diagnose MRSA infection nor to guide or monitor treatment for MRSA infections. Performed at [facility], [address], [place], [place] [address]      Labs: BNP (last 3 results) Recent Labs    11/09/17 1113  BNP  392.7*   Basic Metabolic Panel: Recent Labs  Lab 03/30/18 2159 04/01/18 0242 04/02/18 0157 04/03/18 0244 04/04/18 0806  NA 141 139 141 140 141  K 3.9 2.8* 3.1* 3.2* 3.6  CL 108 104 108 109 109  CO2 18* 22 24 24 23   GLUCOSE 110* 119* 98 94 94  BUN 29* 14 6 11 15   CREATININE 1.00 0.59 0.59 0.72 0.63  CALCIUM 8.9 8.5* 8.5* 8.2* 8.6*  MG  --   --   --  1.4*  --    Liver Function Tests: No results for input(s): AST, ALT, ALKPHOS, BILITOT, PROT, ALBUMIN in the last 168 hours. No results for input(s): LIPASE, AMYLASE in the last 168 hours. No results for input(s): AMMONIA in the last 168 hours. CBC: Recent Labs  Lab 03/30/18 2159 04/03/18 0244  WBC 11.3* 7.9  HGB 12.7 11.7*  HCT 39.9 36.0  MCV 99.3 98.4  PLT 204 168   Cardiac Enzymes: Recent Labs  Lab 03/31/18 1505 04/01/18 0242 04/02/18 0157 04/03/18 0244 04/04/18 0212  CKTOTAL 7,030* 6,010* 4,420* 3,277* 2,076*   BNP: Invalid input(s): POCBNP CBG: Recent Labs  Lab  04/01/18 0748  GLUCAP 111*   D-Dimer No results for input(s): DDIMER in the last 72 hours. Hgb A1c No results for input(s): HGBA1C in the last 72 hours. Lipid Profile No results for input(s): CHOL, HDL, LDLCALC, TRIG, CHOLHDL, LDLDIRECT in the last 72 hours. Thyroid function studies No results for input(s): TSH, T4TOTAL, T3FREE, THYROIDAB in the last 72 hours.  Invalid input(s): FREET3 Anemia work up No results for input(s): VITAMINB12, FOLATE, FERRITIN, TIBC, IRON, RETICCTPCT in the last 72 hours. Urinalysis    Component Value Date/Time   COLORURINE YELLOW 05/23/2017 0125   APPEARANCEUR HAZY (A) 05/23/2017 0125   LABSPEC 1.006 05/23/2017 0125   PHURINE 7.0 05/23/2017 0125   GLUCOSEU NEGATIVE 05/23/2017 0125   HGBUR SMALL (A) 05/23/2017 0125   BILIRUBINUR NEGATIVE 05/23/2017 0125   KETONESUR NEGATIVE 05/23/2017 0125   PROTEINUR 30 (A) 05/23/2017 0125   UROBILINOGEN 1.0 04/12/2017 1157   NITRITE NEGATIVE 05/23/2017 0125   LEUKOCYTESUR LARGE (A) 05/23/2017 0125   Sepsis Labs Invalid input(s): PROCALCITONIN,  WBC,  LACTICIDVEN Microbiology Recent Results (from the past 240 hour(s))  MRSA PCR Screening     Status: None   Collection Time: 03/31/18  6:00 PM  Result Value Ref Range Status   MRSA by PCR NEGATIVE NEGATIVE Final    Comment:        The GeneXpert MRSA Assay (FDA approved for NASAL specimens only), is one component of a comprehensive MRSA colonization surveillance program. It is not intended to diagnose MRSA infection nor to guide or monitor treatment for MRSA infections. Performed at [facility], [address], [place], [place] [address]      Time coordinating discharge: 36 minutes.   SIGNED:   Alba Cory, MD  [facility] 04/04/2018, 9:33 AM Pager [phone]  If 7PM-7AM, please contact night-coverage [email or web address] Password [email or web address]

## 2018-04-04 NOTE — Progress Notes (Signed)
Discharged to [facility] by ambulance [facility], stable. Discharge instructions and papers endorsed to ambulance staff. Belongings taken by pt.Attempted to call for report to the facility no one is  available. To try to call back later.

## 2018-04-05 ENCOUNTER — Ambulatory Visit: Payer: Medicare PPO | Admitting: Pulmonary Disease

## 2018-04-05 DIAGNOSIS — Z9181 History of falling: Secondary | ICD-10-CM | POA: Diagnosis not present

## 2018-04-05 DIAGNOSIS — S42301A Unspecified fracture of shaft of humerus, right arm, initial encounter for closed fracture: Secondary | ICD-10-CM | POA: Diagnosis not present

## 2018-04-05 DIAGNOSIS — I480 Paroxysmal atrial fibrillation: Secondary | ICD-10-CM | POA: Diagnosis not present

## 2018-04-05 DIAGNOSIS — F1010 Alcohol abuse, uncomplicated: Secondary | ICD-10-CM | POA: Diagnosis not present

## 2018-04-10 DIAGNOSIS — M79601 Pain in right arm: Secondary | ICD-10-CM | POA: Diagnosis not present

## 2018-04-12 DIAGNOSIS — J309 Allergic rhinitis, unspecified: Secondary | ICD-10-CM | POA: Diagnosis not present

## 2018-04-18 ENCOUNTER — Other Ambulatory Visit: Payer: Self-pay | Admitting: Adult Health

## 2018-04-24 DIAGNOSIS — I480 Paroxysmal atrial fibrillation: Secondary | ICD-10-CM | POA: Diagnosis not present

## 2018-04-24 DIAGNOSIS — E039 Hypothyroidism, unspecified: Secondary | ICD-10-CM | POA: Diagnosis not present

## 2018-04-24 DIAGNOSIS — S42301D Unspecified fracture of shaft of humerus, right arm, subsequent encounter for fracture with routine healing: Secondary | ICD-10-CM | POA: Diagnosis not present

## 2018-04-24 DIAGNOSIS — F339 Major depressive disorder, recurrent, unspecified: Secondary | ICD-10-CM | POA: Diagnosis not present

## 2018-04-24 DIAGNOSIS — F1010 Alcohol abuse, uncomplicated: Secondary | ICD-10-CM | POA: Diagnosis not present

## 2018-04-24 DIAGNOSIS — I129 Hypertensive chronic kidney disease with stage 1 through stage 4 chronic kidney disease, or unspecified chronic kidney disease: Secondary | ICD-10-CM | POA: Diagnosis not present

## 2018-04-24 DIAGNOSIS — E785 Hyperlipidemia, unspecified: Secondary | ICD-10-CM | POA: Diagnosis not present

## 2018-04-24 DIAGNOSIS — N182 Chronic kidney disease, stage 2 (mild): Secondary | ICD-10-CM | POA: Diagnosis not present

## 2018-04-24 DIAGNOSIS — I2510 Atherosclerotic heart disease of native coronary artery without angina pectoris: Secondary | ICD-10-CM | POA: Diagnosis not present

## 2018-04-26 NOTE — Progress Notes (Addendum)
Subjective:    Patient ID: Christina Ortiz, female    DOB: 09/25/1929, 84 y.o.   MRN: [id number]  HPI:  Christina Ortiz is here for hosp f/u: admission 03/31/18-04/04/18  Alcohol abuse, hypertension, hyperlipidemia, GERD, hypothyroidism, depression,atrial fibrillation on Xarelto, CAD, s/p stent placement. She sustained fall while intoxicated and found to have a  R midshaft humeral fracture. Per EMS report empty bottle of vodka found next to pt at home where she sustained fall She had brief stay at nursing rehab facility and is now home. She reports being able to perform ADLs Recommendations for Outpatient Follow-up:  1. Follow up with PCP in 1-2 weeks 2. Please obtain BMP/CBC in one week Please follow up with Dr Marin Comment in 1 week.   She was evaluated by PT this morning and orders will be sent. Per PT - she is experiencing dysphagia, de saturation with exertion down to mid 80s Orthostatic hypotension- as low as 70/50 with certain position changes. She reports fatigue and slowly improving appetite. She does not feel that her drinking is a a problem. She declines referral to Rehab facility to address alcohol abuse. She reports medication compliance and reports increase in urinary frequency.  She had to cancel last two Urology appt's She denies thoughts of harming herself/others. She drinks 1-2 vodka drinks/day She declined referral to mental health to address depression, alcohol abuse. Pt has not been driving since hospitalization Daughter is a BS during OV Per pt's daughter she has appt with Pulmonology 05/05/18 Per pt's daughter she has f/u with Orthopedic specialist   Patient Care Team    Relationship Specialty Notifications Start End  William Hamburger D, NP PCP - General Family Medicine  03/30/17   Jake Bathe, MD Consulting Physician Cardiology  03/30/17   Mateo Flow, MD Consulting Physician Ophthalmology  03/30/17   [facility]    03/30/17    Chilton Greathouse, MD Consulting Physician Pulmonary Disease  04/27/18     Patient Active Problem List   Diagnosis Date Noted  . Hospital discharge follow-up 04/27/2018  . Fall 03/31/2018  . Humerus shaft fracture-right 03/31/2018  . Rhabdomyolysis 03/31/2018  . Hypothermia 03/31/2018  . Healthcare maintenance 01/17/2018  . Nocturia 01/17/2018  . Macrocytosis 11/22/2017  . Acute bronchitis due to respiratory syncytial virus (RSV) 11/09/2017  . Acute respiratory failure with hypoxia (HCC) 11/09/2017  . Hypertension 11/09/2017  . PAF (paroxysmal atrial fibrillation) (HCC) 11/09/2017  . Coronary artery disease 11/09/2017  . CKD (chronic kidney disease) stage 2, GFR 60-89 ml/min 11/09/2017  . Hypothyroidism 11/09/2017  . Dyspnea 11/08/2017  . Upper respiratory tract infection 11/08/2017  . Elevated serum creatinine 06/21/2017  . Edema of both ankles 06/21/2017  . Diarrhea 05/25/2017  . Hypokalemia 05/25/2017  . Alcohol abuse 05/25/2017  . Depression, recurrent (HCC) 03/30/2017  . Lumbago 03/30/2017  . Influenza with respiratory manifestation 01/22/2017  . Coronary artery disease due to lipid rich plaque 04/04/2015  . Essential hypertension 04/04/2015  . Hyperlipidemia 04/04/2015  . Former smoker 04/04/2015  . Chronic anticoagulation 04/04/2015     Past Medical History:  Diagnosis Date  . Acute bronchitis 10/2017  . Arthritis   . CKD (chronic kidney disease)    Xarelto dose is 15 mg QD  . Coronary artery disease    a. s/p PCI/stenting to RCA and LAD ([facility] in [place]) in 2006; b. Nuc 1/15: No ischemia, EF 70%  . Depression   . Gastritis   . History of cholecystectomy   .  History of echocardiogram    a. Echo 1/15: Moderate LVH, EF 55-60%, impaired relaxation, mild AS  . Hyperlipidemia   . Hypertension   . Hypokalemia   . Mobitz type 1 second degree atrioventricular block    a. Event Monitor 3/15: NSR, first-degree AV block, second-degree AB block - Mobitz 1,  PACs, NSVT (5 beats)  . PAF (paroxysmal atrial fibrillation) (HCC)   . Thyroid disease      Past Surgical History:  Procedure Laterality Date  . BREAST LUMPECTOMY Left   . CARDIAC CATHETERIZATION    . CATARACT EXTRACTION Bilateral   . CHOLECYSTECTOMY    . DILATION AND CURETTAGE OF UTERUS    . EYE SURGERY       Family History  Problem Relation Age of Onset  . Heart disease Mother   . Heart attack Mother   . Lung cancer Father   . Cancer Father        lung     Social History   Substance and Sexual Activity  Drug Use No     Social History   Substance and Sexual Activity  Alcohol Use Yes  . Alcohol/week: 6.0 oz  . Types: 10 Shots of liquor per week     Social History   Tobacco Use  Smoking Status Former Smoker  . Packs/day: 0.50  . Years: 67.00  . Pack years: 33.50  . Types: Cigarettes  . Last attempt to quit: 10/25/2015  . Years since quitting: 2.5  Smokeless Tobacco Never Used  Tobacco Comment   quit 2015     Outpatient Encounter Medications as of 04/27/2018  Medication Sig  . amLODipine (NORVASC) 5 MG tablet Take 1 tablet (5 mg total) by mouth daily.  . cholecalciferol (VITAMIN D) 1000 units tablet Take 1,000 Units by mouth every evening.  Marland Kitchen doxazosin (CARDURA) 8 MG tablet TAKE 1 TABLET (8 MG TOTAL) BY MOUTH DAILY.  . folic acid (FOLVITE) 1 MG tablet Take 1 tablet (1 mg total) by mouth daily.  Marland Kitchen levothyroxine (SYNTHROID, LEVOTHROID) 75 MCG tablet Take 1 tablet (75 mcg total) by mouth daily before breakfast.  . Multiple Vitamins-Minerals (PRESERVISION AREDS 2 PO) Take 1 tablet by mouth daily.  Marland Kitchen oxyCODONE-acetaminophen (PERCOCET/ROXICET) 5-325 MG tablet Take 1 tablet by mouth every 4 (four) hours as needed for moderate pain.  Marland Kitchen PARoxetine (PAXIL) 40 MG tablet TAKE 1 TABLET BY MOUTH DAILY  . polyethylene glycol (MIRALAX / GLYCOLAX) packet Take 17 g by mouth daily.  . potassium chloride (K-DUR) 10 MEQ tablet TAKE 1 TABLET BY MOUTH DAILY.  Marland Kitchen Propylene  Glycol (SYSTANE BALANCE) 0.6 % SOLN Place 1 drop into both eyes daily.  . quinapril (ACCUPRIL) 40 MG tablet TAKE 1 TABLET (40 MG TOTAL) BY MOUTH DAILY.  . simvastatin (ZOCOR) 5 MG tablet Take 1 tablet (5 mg total) by mouth every evening.  [name] 15 MG TABS tablet TAKE 1 TABLET (15 MG TOTAL) BY MOUTH DAILY WITH SUPPER.  . [DISCONTINUED] amLODipine (NORVASC) 10 MG tablet Take 1 tablet (10 mg total) by mouth daily.  . [DISCONTINUED] metoprolol succinate (TOPROL-XL) 25 MG 24 hr tablet TAKE 1 TABLET BY MOUTH DAILY.  . [DISCONTINUED] metoprolol succinate (TOPROL-XL) 25 MG 24 hr tablet Take one tablet by mouth daily  . metoprolol tartrate (LOPRESSOR) 25 MG tablet Take 0.5 tablets (12.5 mg total) by mouth 2 (two) times daily.  Marland Kitchen omeprazole (PRILOSEC) 20 MG capsule Take 1 capsule (20 mg total) by mouth daily as needed (heartburn). (Patient not taking:  Reported on 04/27/2018)  . thiamine 100 MG tablet Take 1 tablet (100 mg total) by mouth daily. (Patient not taking: Reported on 04/27/2018)  . [DISCONTINUED] levalbuterol (XOPENEX) 0.63 MG/3ML nebulizer solution INHALE THE CONTENTS OF 1 VIAL VIA NEBULIZER EVERY 8 HOURS AS NEEDED FOR WHEEZING OR SHORTNESS OF BREATH.   No facility-administered encounter medications on file as of 04/27/2018.     Allergies: Tape  Body mass index is 25.46 kg/m.  Blood pressure (!) 91/57, pulse 63, height 5\' 5"  (1.651 m), weight 153 lb (69.4 kg), SpO2 92 %.   Review of Systems  Constitutional: Positive for activity change, appetite change and fatigue. Negative for chills, diaphoresis, fever and unexpected weight change.  Respiratory: Positive for cough and shortness of breath. Negative for chest tightness, wheezing and stridor.   Cardiovascular: Positive for leg swelling. Negative for chest pain and palpitations.  Gastrointestinal: Negative for abdominal distention, anal bleeding, blood in stool, constipation, diarrhea, nausea and vomiting.  Endocrine: Negative for cold  intolerance, heat intolerance, polydipsia, polyphagia and polyuria.  Genitourinary: Positive for frequency. Negative for difficulty urinating and dysuria.  Musculoskeletal: Positive for arthralgias, back pain, gait problem, joint swelling and myalgias.  Skin: Negative for color change, pallor, rash and wound.  Neurological: Negative for dizziness and headaches.  Hematological: Does not bruise/bleed easily.  Psychiatric/Behavioral: Positive for dysphoric mood and sleep disturbance. Negative for confusion, decreased concentration, hallucinations, self-injury and suicidal ideas. The patient is not nervous/anxious and is not hyperactive.        Objective:   Physical Exam  Constitutional: She is oriented to person, place, and time. She appears well-developed and well-nourished. No distress.  HENT:  Head: Normocephalic and atraumatic.  Right Ear: External ear normal.  Left Ear: External ear normal.  Eyes: Pupils are equal, round, and reactive to light. EOM are normal.  Cardiovascular: Normal rate, regular rhythm, normal heart sounds and intact distal pulses.  Pulmonary/Chest: Effort normal. No stridor. No respiratory distress. She has decreased breath sounds in the right lower field and the left lower field. She has no wheezes. She has no rhonchi. She has no rales.  Musculoskeletal: She exhibits edema and tenderness.       Right upper arm: Normal.       Right forearm: She exhibits tenderness, swelling and edema.       Right hand: Normal.  Neurological: She is alert and oriented to person, place, and time.  Skin: Skin is warm and dry. No rash noted. She is not diaphoretic. No erythema. No pallor.  Nursing note and vitals reviewed.     Assessment & Plan:   1. Chronic anticoagulation   2. Hypokalemia   3. Elevated serum creatinine   4. Shortness of breath   5. Hospital discharge follow-up   6. Essential hypertension   7. Alcohol abuse   8. Healthcare maintenance     Hospital discharge  follow-up 1) Please return in morning for labs. 2) Please see your Pulmonologist, re: shortness of breath, low oxygen level.  Wear home oxygen per Pulmonology directions. 3) Please see your Cardiologist, re: low blood pressure and lower heart rate Stop Amlodipine 10mg  once daily, Please take Amlodipine 5mg  once daily. Stop Metoprolol XL 25mg  once daily, Please take Metoprolol 25 mg 1/2 tablet once every 12 hrs. Continue all other medication as directed  4) Please call your Urology, re urinary frequency and medication  5) Stop Alcohol use, if you would life referral to mental health or rehab please call clinic. 6) Follow  heart healthy diet. 7) Please follow up with Orthopedic specialist as directed 8) Continue with home therapy. 9) Follow up here in 4 weeks  Essential hypertension BP 91/57 Hr 63 Stop Amlodipine 10mg  once daily, Please take Amlodipine 5mg  once daily. Stop Metoprolol XL 25mg  once daily, Please take Metoprolol 25 mg 1/2 tablet once every 12 hrs. Please see your Cardiologist, re: low blood pressure and lower heart rate Reviewed med changes at length  Alcohol abuse Denies alcohol abuse Declines referral to mental health She denies thoughts of harming herself/others  Dyspnea CXR: IMPRESSION: 1. Stable severe cardiomegaly. 2. Low lung volumes with bibasilar atelectasis again noted. Follow-up with Pulmonologist Use home 02 per Pulmonology instructions  Healthcare maintenance F/u 4 weeks  Discussed at length current plan of care Discussed at length that she should not be drinking any ETOH and she should not be driving Reviewed medications at length All questions/concerns answered  FOLLOW-UP:  Return in about 1 month (around 05/25/2018).

## 2018-04-27 ENCOUNTER — Encounter: Payer: Self-pay | Admitting: Adult Health

## 2018-04-27 ENCOUNTER — Ambulatory Visit (INDEPENDENT_AMBULATORY_CARE_PROVIDER_SITE_OTHER): Payer: Medicare PPO | Admitting: Adult Health

## 2018-04-27 ENCOUNTER — Telehealth: Payer: Self-pay

## 2018-04-27 ENCOUNTER — Telehealth: Payer: Self-pay | Admitting: Adult Health

## 2018-04-27 ENCOUNTER — Ambulatory Visit: Payer: Medicare PPO

## 2018-04-27 VITALS — BP 91/57 | HR 63 | Ht 65.0 in | Wt 153.0 lb

## 2018-04-27 DIAGNOSIS — I480 Paroxysmal atrial fibrillation: Secondary | ICD-10-CM | POA: Diagnosis not present

## 2018-04-27 DIAGNOSIS — R7989 Other specified abnormal findings of blood chemistry: Secondary | ICD-10-CM

## 2018-04-27 DIAGNOSIS — I10 Essential (primary) hypertension: Secondary | ICD-10-CM

## 2018-04-27 DIAGNOSIS — E039 Hypothyroidism, unspecified: Secondary | ICD-10-CM | POA: Diagnosis not present

## 2018-04-27 DIAGNOSIS — F1010 Alcohol abuse, uncomplicated: Secondary | ICD-10-CM

## 2018-04-27 DIAGNOSIS — R0602 Shortness of breath: Secondary | ICD-10-CM | POA: Diagnosis not present

## 2018-04-27 DIAGNOSIS — F339 Major depressive disorder, recurrent, unspecified: Secondary | ICD-10-CM | POA: Diagnosis not present

## 2018-04-27 DIAGNOSIS — Z09 Encounter for follow-up examination after completed treatment for conditions other than malignant neoplasm: Secondary | ICD-10-CM | POA: Diagnosis not present

## 2018-04-27 DIAGNOSIS — E785 Hyperlipidemia, unspecified: Secondary | ICD-10-CM | POA: Diagnosis not present

## 2018-04-27 DIAGNOSIS — S42301D Unspecified fracture of shaft of humerus, right arm, subsequent encounter for fracture with routine healing: Secondary | ICD-10-CM | POA: Diagnosis not present

## 2018-04-27 DIAGNOSIS — Z7901 Long term (current) use of anticoagulants: Secondary | ICD-10-CM

## 2018-04-27 DIAGNOSIS — Z0000 Encounter for general adult medical examination without abnormal findings: Secondary | ICD-10-CM

## 2018-04-27 DIAGNOSIS — I2510 Atherosclerotic heart disease of native coronary artery without angina pectoris: Secondary | ICD-10-CM | POA: Diagnosis not present

## 2018-04-27 DIAGNOSIS — N182 Chronic kidney disease, stage 2 (mild): Secondary | ICD-10-CM | POA: Diagnosis not present

## 2018-04-27 DIAGNOSIS — E876 Hypokalemia: Secondary | ICD-10-CM | POA: Diagnosis not present

## 2018-04-27 DIAGNOSIS — I129 Hypertensive chronic kidney disease with stage 1 through stage 4 chronic kidney disease, or unspecified chronic kidney disease: Secondary | ICD-10-CM | POA: Diagnosis not present

## 2018-04-27 MED ORDER — METOPROLOL TARTRATE 25 MG PO TABS: 12.5000 mg | ORAL_TABLET | Freq: Two times a day (BID) | ORAL | 0 refills | Status: DC

## 2018-04-27 MED ORDER — AMLODIPINE BESYLATE 5 MG PO TABS: 5.0000 mg | ORAL_TABLET | Freq: Every day | ORAL | 1 refills | Status: DC

## 2018-04-27 MED ORDER — METOPROLOL SUCCINATE ER 25 MG PO TB24
ORAL_TABLET | ORAL | 2 refills | Status: DC
Start: 2018-04-27 — End: 2018-04-27

## 2018-04-27 NOTE — Assessment & Plan Note (Addendum)
CXR: IMPRESSION: 1. Stable severe cardiomegaly. 2. Low lung volumes with bibasilar atelectasis again noted. Follow-up with Pulmonologist Use home 02 per Pulmonology instructions

## 2018-04-27 NOTE — Assessment & Plan Note (Signed)
F/u 4 weeks

## 2018-04-27 NOTE — Telephone Encounter (Signed)
Called Mr. Hoffman but there was no answer, left voicemail for him to call office and obtain information. In case he doesn't call back I will also forward this message to Mrs. Tonya.  W.Jeilyn Reznik, CCMA

## 2018-04-27 NOTE — Telephone Encounter (Signed)
Good Afternoon Whitney, Can you please call Mr. [name] at [facility]- [phone] I am giving verbal orders for: 1) PT 3 x week for 4 weeks 2) Speech therapy to evaluate dysphagia  Here are answered to his concerns: 1) CXR completed today and she had appt with Pulmonologist 05/05/18- Dr. Isaiah Serge She is to use supplemental 02 per Pulmonology Her sat on RA was 92% and she did not exhibit respiratory distress  2) BP was little low in clinic, but she denied acute cardiac sx's. Her CCB and BB were reduced and she was advised to call her cardiologist- Dr. Anne Fu 3) She is seen by [facility] 4) Ortho Specialist- Dr. Marin Comment Thanks! Orpha Bur

## 2018-04-27 NOTE — Assessment & Plan Note (Addendum)
1) Please return in morning for labs. 2) Please see your Pulmonologist, re: shortness of breath, low oxygen level.  Wear home oxygen per Pulmonology directions. 3) Please see your Cardiologist, re: low blood pressure and lower heart rate Stop Amlodipine 10mg  once daily, Please take Amlodipine 5mg  once daily. Stop Metoprolol XL 25mg  once daily, Please take Metoprolol 25 mg 1/2 tablet once every 12 hrs. Continue all other medication as directed  4) Please call your Urology, re urinary frequency and medication  5) Stop Alcohol use, if you would life referral to mental health or rehab please call clinic. 6) Follow heart healthy diet. 7) Please follow up with Orthopedic specialist as directed 8) Continue with home therapy. 9) Follow up here in 4 weeks

## 2018-04-27 NOTE — Assessment & Plan Note (Signed)
Denies alcohol abuse Declines referral to mental health She denies thoughts of harming herself/others

## 2018-04-27 NOTE — Telephone Encounter (Signed)
Christina Ortiz with [facility] called stating that he did PT evaluation today and is requesting PT 3x/weekly x 4 weeks.  He is also requesting verbal authorization for speech therapy for "choking".  He further states that the pt's oxygen has been running 93-94% at rest, but drops to 85% with exertion of approximately 200' and experiences SOB.  Also, pt is having orthostatic blood pressures, sitting it was 140/70 and dropped to 70/50 when standing, but pt denies dizziness.  He also needs to know who the pt's specialists are for ortho, pulmonology and urology.  Tiajuana Amass, CMA

## 2018-04-27 NOTE — Patient Instructions (Addendum)
Hypotension Hypotension, commonly called low blood pressure, is when the force of blood pumping through your arteries is too weak. Arteries are blood vessels that carry blood from the heart throughout the body. When blood pressure is too low, you may not get enough blood to your brain or to the rest of your organs. This can cause weakness, light-headedness, rapid heartbeat, and fainting. Depending on the cause and severity, hypotension may be harmless (benign) or cause serious problems (critical). What are the causes? Possible causes of hypotension include:  Blood loss.  Loss of body fluids (dehydration).  Heart problems.  Hormone (endocrine) problems.  Pregnancy.  Severe infection.  Lack of certain nutrients.  Severe allergic reactions (anaphylaxis).  Certain medicines, such as blood pressure medicine or medicines that make the body lose excess fluids (diuretics). Sometimes, hypotension can be caused by not taking medicine as directed, such as taking too much of a certain medicine.  What increases the risk? Certain factors can make you more likely to develop hypotension, including:  Age. Risk increases as you get older.  Conditions that affect the heart or the central nervous system.  Taking certain medicines, such as blood pressure medicine or diuretics.  Being pregnant.  What are the signs or symptoms? Symptoms of this condition may include:  Weakness.  Light-headedness.  Dizziness.  Blurred vision.  Fatigue.  Rapid heartbeat.  Fainting, in severe cases.  How is this diagnosed? This condition is diagnosed based on:  Your medical history.  Your symptoms.  Your blood pressure measurement. Your health care provider will check your blood pressure when you are: ? Lying down. ? Sitting. ? Standing.  A blood pressure reading is recorded as two numbers, such as "120 over 80" (or 120/80). The first ("top") number is called the systolic pressure. It is a  measure of the pressure in your arteries as your heart beats. The second ("bottom") number is called the diastolic pressure. It is a measure of the pressure in your arteries when your heart relaxes between beats. Blood pressure is measured in a unit called mm Hg. Healthy blood pressure for adults is 120/80. If your blood pressure is below 90/60, you may be diagnosed with hypotension. Other information or tests that may be used to diagnose hypotension include:  Your other vital signs, such as your heart rate and temperature.  Blood tests.  Tilt table test. For this test, you will be safely secured to a table that moves you from a lying position to an upright position. Your heart rhythm and blood pressure will be monitored during the test.  How is this treated? Treatment for this condition may include:  Changing your diet. This may involve eating more salt (sodium) or drinking more water.  Taking medicines to raise your blood pressure.  Changing the dosage of certain medicines you are taking that might be lowering your blood pressure.  Wearing compression stockings. These stockings help to prevent blood clots and reduce swelling in your legs.  In some cases, you may need to go to the hospital for:  Fluid replacement. This means you will receive fluids through an IV tube.  Blood replacement. This means you will receive donated blood through an IV tube (transfusion).  Treating an infection or heart problems, if this applies.  Monitoring. You may need to be monitored while medicines that you are taking wear off.  Follow these instructions at home: Eating and drinking   Drink enough fluid to keep your urine clear or pale yellow.  Eat a healthy diet and follow instructions from your health care provider about eating or drinking restrictions. A healthy diet includes: ? Fresh fruits and vegetables. ? Whole grains. ? Lean meats. ? Low-fat dairy products.  Eat extra salt only as  directed. Do not add extra salt to your diet unless your health care provider told you to do that.  Eat frequent, small meals.  Avoid standing up suddenly after eating. Medicines  Take over-the-counter and prescription medicines only as told by your health care provider. ? Follow instructions from your health care provider about changing the dosage of your current medicines, if this applies. ? Do not stop or adjust any of your medicines on your own. General instructions  Wear compression stockings as told by your health care provider.  Get up slowly from lying down or sitting positions. This gives your blood pressure a chance to adjust.  Avoid hot showers and excessive heat as directed by your health care provider.  Return to your normal activities as told by your health care provider. Ask your health care provider what activities are safe for you.  Do not use any products that contain nicotine or tobacco, such as cigarettes and e-cigarettes. If you need help quitting, ask your health care provider.  Keep all follow-up visits as told by your health care provider. This is important. Contact a health care provider if:  You vomit.  You have diarrhea.  You have a fever for more than 2-3 days.  You feel more thirsty than usual.  You feel weak and tired. Get help right away if:  You have chest pain.  You have a fast or irregular heartbeat.  You develop numbness in any part of your body.  You cannot move your arms or your legs.  You have trouble speaking.  You become sweaty or feel light-headed.  You faint.  You feel short of breath.  You have trouble staying awake.  You feel confused. This information is not intended to replace advice given to you by your health care provider. Make sure you discuss any questions you have with your health care provider. Document Released: 12/13/2005 Document Revised: 07/02/2016 Document Reviewed: 06/03/2016 Elsevier Interactive  Patient Education  2018 [employer].   Mediterranean Diet A Mediterranean diet refers to food and lifestyle choices that are based on the traditions of countries located on the [employer]. This way of eating has been shown to help prevent certain conditions and improve outcomes for people who have chronic diseases, like kidney disease and heart disease. What are tips for following this plan? Lifestyle  Cook and eat meals together with your family, when possible.  Drink enough fluid to keep your urine clear or pale yellow.  Be physically active every day. This includes: ? Aerobic exercise like running or swimming. ? Leisure activities like gardening, walking, or housework.  Get 7-8 hours of sleep each night.  If recommended by your health care provider, drink red wine in moderation. This means 1 glass a day for nonpregnant women and 2 glasses a day for men. A glass of wine equals 5 oz (150 mL). Reading food labels  Check the serving size of packaged foods. For foods such as rice and pasta, the serving size refers to the amount of cooked product, not dry.  Check the total fat in packaged foods. Avoid foods that have saturated fat or trans fats.  Check the ingredients list for added sugars, such as corn syrup. Shopping  At the grocery  store, buy most of your food from the areas near the walls of the store. This includes: ? Fresh fruits and vegetables (produce). ? Grains, beans, nuts, and seeds. Some of these may be available in unpackaged forms or large amounts (in bulk). ? Fresh seafood. ? Poultry and eggs. ? Low-fat dairy products.  Buy whole ingredients instead of prepackaged foods.  Buy fresh fruits and vegetables in-season from local farmers markets.  Buy frozen fruits and vegetables in resealable bags.  If you do not have access to quality fresh seafood, buy precooked frozen shrimp or canned fish, such as tuna, salmon, or sardines.  Buy small amounts of raw or  cooked vegetables, salads, or olives from the deli or salad bar at your store.  Stock your pantry so you always have certain foods on hand, such as olive oil, canned tuna, canned tomatoes, rice, pasta, and beans. Cooking  Cook foods with extra-virgin olive oil instead of using butter or other vegetable oils.  Have meat as a side dish, and have vegetables or grains as your main dish. This means having meat in small portions or adding small amounts of meat to foods like pasta or stew.  Use beans or vegetables instead of meat in common dishes like chili or lasagna.  Experiment with different cooking methods. Try roasting or broiling vegetables instead of steaming or sauteing them.  Add frozen vegetables to soups, stews, pasta, or rice.  Add nuts or seeds for added healthy fat at each meal. You can add these to yogurt, salads, or vegetable dishes.  Marinate fish or vegetables using olive oil, lemon juice, garlic, and fresh herbs. Meal planning  Plan to eat 1 vegetarian meal one day each week. Try to work up to 2 vegetarian meals, if possible.  Eat seafood 2 or more times a week.  Have healthy snacks readily available, such as: ? Vegetable sticks with hummus. ? [place] yogurt. ? Fruit and nut trail mix.  Eat balanced meals throughout the week. This includes: ? Fruit: 2-3 servings a day ? Vegetables: 4-5 servings a day ? Low-fat dairy: 2 servings a day ? Fish, poultry, or lean meat: 1 serving a day ? Beans and legumes: 2 or more servings a week ? Nuts and seeds: 1-2 servings a day ? Whole grains: 6-8 servings a day ? Extra-virgin olive oil: 3-4 servings a day  Limit red meat and sweets to only a few servings a month What are my food choices?  Mediterranean diet ? Recommended ? Grains: Whole-grain pasta. Brown rice. Bulgar wheat. Polenta. Couscous. Whole-wheat bread. Orpah Cobb. ? Vegetables: Artichokes. Beets. Broccoli. Cabbage. Carrots. Eggplant. Green beans. Chard. Kale.  Spinach. Onions. Leeks. Peas. Squash. Tomatoes. Peppers. Radishes. ? Fruits: Apples. Apricots. Avocado. Berries. Bananas. Cherries. Dates. Figs. Grapes. Lemons. Melon. Oranges. Peaches. Plums. Pomegranate. ? Meats and other protein foods: Beans. Almonds. Sunflower seeds. Pine nuts. Peanuts. Cod. Salmon. Scallops. Shrimp. Tuna. Tilapia. Clams. Oysters. Eggs. ? Dairy: Low-fat milk. Cheese. Greek yogurt. ? Beverages: Water. Red wine. Herbal tea. ? Fats and oils: Extra virgin olive oil. Avocado oil. Grape seed oil. ? Sweets and desserts: [place] yogurt with honey. Baked apples. Poached pears. Trail mix. ? Seasoning and other foods: Basil. Cilantro. Coriander. Cumin. Mint. Parsley. Sage. Rosemary. Tarragon. Garlic. Oregano. Thyme. Pepper. Balsalmic vinegar. Tahini. Hummus. Tomato sauce. Olives. Mushrooms. ? Limit these ? Grains: Prepackaged pasta or rice dishes. Prepackaged cereal with added sugar. ? Vegetables: Deep fried potatoes (french fries). ? Fruits: Fruit canned in syrup. ? Meats  and other protein foods: Beef. Pork. Lamb. Poultry with skin. Hot dogs. Tomasa Blase. ? Dairy: Ice cream. Sour cream. Whole milk. ? Beverages: Juice. Sugar-sweetened soft drinks. Beer. Liquor and spirits. ? Fats and oils: Butter. Canola oil. Vegetable oil. Beef fat (tallow). Lard. ? Sweets and desserts: Cookies. Cakes. Pies. Candy. ? Seasoning and other foods: Mayonnaise. Premade sauces and marinades. ? The items listed may not be a complete list. Talk with your dietitian about what dietary choices are right for you. Summary  The Mediterranean diet includes both food and lifestyle choices.  Eat a variety of fresh fruits and vegetables, beans, nuts, seeds, and whole grains.  Limit the amount of red meat and sweets that you eat.  Talk with your health care provider about whether it is safe for you to drink red wine in moderation. This means 1 glass a day for nonpregnant women and 2 glasses a day for men. A glass of wine  equals 5 oz (150 mL). This information is not intended to replace advice given to you by your health care provider. Make sure you discuss any questions you have with your health care provider. Document Released: 08/05/2016 Document Revised: 09/07/2016 Document Reviewed: 08/05/2016 Elsevier Interactive Patient Education  2018 [employer].  1) Please return in morning for labs. 2) Please see your Pulmonologist, re: shortness of breath, low oxygen level.  Wear home oxygen per Pulmonology directions. 3) Please see your Cardiologist, re: low blood pressure and low heart rate Stop Amlodipine 10mg  once daily, Please take Amlodipine 5mg  once daily. Stop Metoprolol XL 25mg  once daily, Please take Metoprolol 25 mg 1/2 tablet once every 12 hrs. Continue all other medication as directed  4) Please call your Urology, re urinary frequency and medication  5) Stop Alcohol use, if you would life referral to mental health or rehab please call clinic. 6) Follow heart healthy diet. 7) Please follow up with Orthopedic specialist as directed 8) Continue with home therapy. 9) Follow up here in 4 weeks NICE TO SEE YOU!

## 2018-04-27 NOTE — Assessment & Plan Note (Addendum)
BP 91/57 Hr 63 Stop Amlodipine 10mg  once daily, Please take Amlodipine 5mg  once daily. Stop Metoprolol XL 25mg  once daily, Please take Metoprolol 25 mg 1/2 tablet once every 12 hrs. Please see your Cardiologist, re: low blood pressure and lower heart rate Reviewed med changes at length

## 2018-04-27 NOTE — Telephone Encounter (Signed)
Judeth Cornfield @ [facility] [phone] needs Orpha Bur to clarify Prescriptions  1)- amLODipine (NORVASC) 5 MG tablet [[id number]]   Order Details  Dose: 5 mg Route: Oral Frequency: Daily  Dispense Quantity: 90 tablet Refills: 1 Fills remaining: --        Sig: Take 1 tablet (5 mg total) by mouth daily.          ---Pharmacist questioned  (Dosage Changes)----   2)-   metoprolol tartrate (LOPRESSOR) 25 MG tablet [[id number]]   Order Details  Dose: 12.5 mg Route: Oral Frequency: 2 times daily  Dispense Quantity: 90 tablet Refills: 0 Fills remaining: --        Sig: Take 0.5 tablets (12.5 mg total) by mouth 2 (two) times daily     ---Pharmacist states they Rcvd (2  Diff Rx) for this medicine needs clarification.  Please call [facility] @ [phone]  --Fausto Skillern

## 2018-04-28 ENCOUNTER — Other Ambulatory Visit (INDEPENDENT_AMBULATORY_CARE_PROVIDER_SITE_OTHER): Payer: Medicare PPO

## 2018-04-28 DIAGNOSIS — I2583 Coronary atherosclerosis due to lipid rich plaque: Secondary | ICD-10-CM

## 2018-04-28 DIAGNOSIS — N182 Chronic kidney disease, stage 2 (mild): Secondary | ICD-10-CM

## 2018-04-28 DIAGNOSIS — Z7901 Long term (current) use of anticoagulants: Secondary | ICD-10-CM

## 2018-04-28 DIAGNOSIS — I10 Essential (primary) hypertension: Secondary | ICD-10-CM | POA: Diagnosis not present

## 2018-04-28 DIAGNOSIS — I2510 Atherosclerotic heart disease of native coronary artery without angina pectoris: Secondary | ICD-10-CM

## 2018-04-28 NOTE — Telephone Encounter (Signed)
Threasa Alpha with [facility] informed of all information.  Tiajuana Amass, CMA

## 2018-04-28 NOTE — Telephone Encounter (Signed)
Please confirm.  Tiajuana Amass, CMA

## 2018-04-29 LAB — BASIC METABOLIC PANEL
BUN/Creatinine Ratio: 20 (ref 12–28)
BUN: 16 mg/dL (ref 8–27)
CALCIUM: 9.4 mg/dL (ref 8.7–10.3)
CHLORIDE: 103 mmol/L (ref 96–106)
CO2: 25 mmol/L (ref 20–29)
Creatinine, Ser: 0.8 mg/dL (ref 0.57–1.00)
GFR calc Af Amer: 77 mL/min/{1.73_m2} (ref >59)
GFR calc non Af Amer: 67 mL/min/{1.73_m2} (ref >59)
Glucose: 83 mg/dL (ref 65–99)
POTASSIUM: 4.2 mmol/L (ref 3.5–5.2)
Sodium: 142 mmol/L (ref 134–144)

## 2018-04-29 LAB — CBC WITH DIFFERENTIAL/PLATELET
BASOS ABS: 0 10*3/uL (ref 0.0–0.2)
BASOS: 0 %
EOS (ABSOLUTE): 0.3 10*3/uL (ref 0.0–0.4)
Eos: 4 %
Hematocrit: 35.6 % (ref 34.0–46.6)
Hemoglobin: 11.8 g/dL (ref 11.1–15.9)
IMMATURE GRANS (ABS): 0 10*3/uL (ref 0.0–0.1)
IMMATURE GRANULOCYTES: 0 %
LYMPHS: 22 %
Lymphocytes Absolute: 1.5 10*3/uL (ref 0.7–3.1)
MCH: 31.6 pg (ref 26.6–33.0)
MCHC: 33.1 g/dL (ref 31.5–35.7)
MCV: 95 fL (ref 79–97)
MONOS ABS: 0.8 10*3/uL (ref 0.1–0.9)
Monocytes: 11 %
NEUTROS ABS: 4.3 10*3/uL (ref 1.4–7.0)
NEUTROS PCT: 63 %
PLATELETS: 222 10*3/uL (ref 150–379)
RBC: 3.74 x10E6/uL — ABNORMAL LOW (ref 3.77–5.28)
RDW: 13.6 % (ref 12.3–15.4)
WBC: 6.9 10*3/uL (ref 3.4–10.8)

## 2018-05-01 DIAGNOSIS — M79601 Pain in right arm: Secondary | ICD-10-CM | POA: Diagnosis not present

## 2018-05-02 DIAGNOSIS — I480 Paroxysmal atrial fibrillation: Secondary | ICD-10-CM | POA: Diagnosis not present

## 2018-05-02 DIAGNOSIS — N182 Chronic kidney disease, stage 2 (mild): Secondary | ICD-10-CM | POA: Diagnosis not present

## 2018-05-02 DIAGNOSIS — F1010 Alcohol abuse, uncomplicated: Secondary | ICD-10-CM | POA: Diagnosis not present

## 2018-05-02 DIAGNOSIS — E785 Hyperlipidemia, unspecified: Secondary | ICD-10-CM | POA: Diagnosis not present

## 2018-05-02 DIAGNOSIS — S42301D Unspecified fracture of shaft of humerus, right arm, subsequent encounter for fracture with routine healing: Secondary | ICD-10-CM | POA: Diagnosis not present

## 2018-05-02 DIAGNOSIS — I2510 Atherosclerotic heart disease of native coronary artery without angina pectoris: Secondary | ICD-10-CM | POA: Diagnosis not present

## 2018-05-02 DIAGNOSIS — F339 Major depressive disorder, recurrent, unspecified: Secondary | ICD-10-CM | POA: Diagnosis not present

## 2018-05-02 DIAGNOSIS — E039 Hypothyroidism, unspecified: Secondary | ICD-10-CM | POA: Diagnosis not present

## 2018-05-02 DIAGNOSIS — I129 Hypertensive chronic kidney disease with stage 1 through stage 4 chronic kidney disease, or unspecified chronic kidney disease: Secondary | ICD-10-CM | POA: Diagnosis not present

## 2018-05-03 DIAGNOSIS — I480 Paroxysmal atrial fibrillation: Secondary | ICD-10-CM | POA: Diagnosis not present

## 2018-05-03 DIAGNOSIS — S42301D Unspecified fracture of shaft of humerus, right arm, subsequent encounter for fracture with routine healing: Secondary | ICD-10-CM | POA: Diagnosis not present

## 2018-05-03 DIAGNOSIS — N182 Chronic kidney disease, stage 2 (mild): Secondary | ICD-10-CM | POA: Diagnosis not present

## 2018-05-03 DIAGNOSIS — I2510 Atherosclerotic heart disease of native coronary artery without angina pectoris: Secondary | ICD-10-CM | POA: Diagnosis not present

## 2018-05-03 DIAGNOSIS — E785 Hyperlipidemia, unspecified: Secondary | ICD-10-CM | POA: Diagnosis not present

## 2018-05-03 DIAGNOSIS — E039 Hypothyroidism, unspecified: Secondary | ICD-10-CM | POA: Diagnosis not present

## 2018-05-03 DIAGNOSIS — F1010 Alcohol abuse, uncomplicated: Secondary | ICD-10-CM | POA: Diagnosis not present

## 2018-05-03 DIAGNOSIS — I129 Hypertensive chronic kidney disease with stage 1 through stage 4 chronic kidney disease, or unspecified chronic kidney disease: Secondary | ICD-10-CM | POA: Diagnosis not present

## 2018-05-03 DIAGNOSIS — F339 Major depressive disorder, recurrent, unspecified: Secondary | ICD-10-CM | POA: Diagnosis not present

## 2018-05-04 ENCOUNTER — Telehealth: Payer: Self-pay | Admitting: Adult Health

## 2018-05-04 DIAGNOSIS — S42301D Unspecified fracture of shaft of humerus, right arm, subsequent encounter for fracture with routine healing: Secondary | ICD-10-CM | POA: Diagnosis not present

## 2018-05-04 DIAGNOSIS — I129 Hypertensive chronic kidney disease with stage 1 through stage 4 chronic kidney disease, or unspecified chronic kidney disease: Secondary | ICD-10-CM | POA: Diagnosis not present

## 2018-05-04 DIAGNOSIS — E785 Hyperlipidemia, unspecified: Secondary | ICD-10-CM | POA: Diagnosis not present

## 2018-05-04 DIAGNOSIS — E039 Hypothyroidism, unspecified: Secondary | ICD-10-CM | POA: Diagnosis not present

## 2018-05-04 DIAGNOSIS — R351 Nocturia: Secondary | ICD-10-CM | POA: Diagnosis not present

## 2018-05-04 DIAGNOSIS — N182 Chronic kidney disease, stage 2 (mild): Secondary | ICD-10-CM | POA: Diagnosis not present

## 2018-05-04 DIAGNOSIS — I480 Paroxysmal atrial fibrillation: Secondary | ICD-10-CM | POA: Diagnosis not present

## 2018-05-04 DIAGNOSIS — F339 Major depressive disorder, recurrent, unspecified: Secondary | ICD-10-CM | POA: Diagnosis not present

## 2018-05-04 DIAGNOSIS — N3941 Urge incontinence: Secondary | ICD-10-CM | POA: Diagnosis not present

## 2018-05-04 DIAGNOSIS — I2510 Atherosclerotic heart disease of native coronary artery without angina pectoris: Secondary | ICD-10-CM | POA: Diagnosis not present

## 2018-05-04 DIAGNOSIS — F1010 Alcohol abuse, uncomplicated: Secondary | ICD-10-CM | POA: Diagnosis not present

## 2018-05-04 NOTE — Telephone Encounter (Signed)
LVM for Darl Pikes with authorization to continue OT as requested.  Tiajuana Amass, CMA

## 2018-05-04 NOTE — Telephone Encounter (Signed)
Received call from [facility] @ [facility] for  (Verbal Auth )  [facility] request someone call them with Verbal Authorization to continue  Occ Therapy  Twice weekly for the next 4 weeks.   Please call:   [phone]   --glh

## 2018-05-05 ENCOUNTER — Encounter: Payer: Self-pay | Admitting: Pulmonary Disease

## 2018-05-05 ENCOUNTER — Ambulatory Visit: Payer: Medicare PPO | Admitting: Pulmonary Disease

## 2018-05-05 ENCOUNTER — Ambulatory Visit (INDEPENDENT_AMBULATORY_CARE_PROVIDER_SITE_OTHER): Payer: Medicare PPO | Admitting: Pulmonary Disease

## 2018-05-05 VITALS — BP 124/60 | HR 60 | Ht 63.0 in | Wt 158.0 lb

## 2018-05-05 DIAGNOSIS — R0602 Shortness of breath: Secondary | ICD-10-CM

## 2018-05-05 LAB — PULMONARY FUNCTION TEST
FEF 25-75 Post: 1.7 L/sec
FEF 25-75 Pre: 1.66 L/sec
FEF2575-%CHANGE-POST: 2 %
FEF2575-%PRED-POST: 168 %
FEF2575-%PRED-PRE: 163 %
FEV1-%Change-Post: 2 %
FEV1-%Pred-Post: 73 %
FEV1-%Pred-Pre: 71 %
FEV1-PRE: 1.15 L
FEV1-Post: 1.18 L
FEV1FVC-%CHANGE-POST: -7 %
FEV1FVC-%Pred-Pre: 121 %
FEV6-%Change-Post: 16 %
FEV6-%PRED-PRE: 61 %
FEV6-%Pred-Post: 71 %
FEV6-PRE: 1.26 L
FEV6-Post: 1.46 L
FEV6FVC-%Change-Post: 3 %
FEV6FVC-%PRED-PRE: 102 %
FEV6FVC-%Pred-Post: 106 %
FVC-%Change-Post: 11 %
FVC-%PRED-PRE: 59 %
FVC-%Pred-Post: 66 %
FVC-POST: 1.46 L
FVC-PRE: 1.31 L
POST FEV6/FVC RATIO: 100 %
PRE FEV1/FVC RATIO: 88 %
Post FEV1/FVC ratio: 81 %
Pre FEV6/FVC Ratio: 96 %

## 2018-05-05 MED ORDER — ALBUTEROL SULFATE HFA 108 (90 BASE) MCG/ACT IN AERS: 2.0000 | INHALATION_SPRAY | Freq: Four times a day (QID) | RESPIRATORY_TRACT | 6 refills | Status: DC | PRN

## 2018-05-05 NOTE — Progress Notes (Signed)
PFT completed today. DLCO and Pleth were attempted several times with no success due to the patient being unable to follow instructions and perform the proper technique of the test.

## 2018-05-05 NOTE — Patient Instructions (Signed)
I have reviewed your pulmonary function test with do not show any clear evidence of COPD I believe you do have chronic bronchitis.  We will give you an albuterol inhaler to be used as needed We will check your oxygen levels on exertion today. Follow-up in 6 months.

## 2018-05-05 NOTE — Progress Notes (Signed)
Christina Ortiz    [id number]    04-08-1929  Primary Care Physician:Danford, Jinny Blossom, NP  Referring Physician: Julaine Fusi, NP [address] [place], [place] [address]  Chief complaint: Consult for dyspnea  HPI: 84 year old former smoker with coronary artery disease, paroxysmal atrial fibrillation on Xarelto, hypothyroidism, chronic kidney disease and hypertension. She was hospitalized in mid November for acute respiratory failure with hypoxia secondary to RSV infection.  She had traveled to [place] prior to this illness and was sitting next to a passenger with bronchitis and coughing.  She quit smoking 3 years ago but resumed during the week she was in [place].  Antibiotics were started during this admission but were discontinued as there is low suspicion for infection and pro calcitonin level was low. She was treated with nebs, steroids and discharged on 2 L oxygen.  She is not using the oxygen on a regular basis.  She monitors her oxygen levels at home and they have remained about 90%.  Pulmonary was consulted during the admission and recommended outpatient pulmonary function test.  Pets: None Occupation: Retired Diplomatic Services operational officer for an [employer] Exposures: None Smoking history: 60-pack-year smoking history.  Quit in 2015 except for a week November Travel History: To [place] and [place] this year.  No other significant travel.  Interim history: She continues to improve after her hospitalization in November 2018.  She has been herself off oxygen and is not using any inhalers.  Outpatient Encounter Medications as of 05/05/2018  Medication Sig  . amLODipine (NORVASC) 5 MG tablet Take 1 tablet (5 mg total) by mouth daily.  . cholecalciferol (VITAMIN D) 1000 units tablet Take 1,000 Units by mouth every evening.  Marland Kitchen doxazosin (CARDURA) 8 MG tablet TAKE 1 TABLET (8 MG TOTAL) BY MOUTH DAILY.  . folic acid (FOLVITE) 1 MG tablet Take 1 tablet (1 mg total) by mouth daily.  Marland Kitchen  levothyroxine (SYNTHROID, LEVOTHROID) 75 MCG tablet Take 1 tablet (75 mcg total) by mouth daily before breakfast.  . metoprolol tartrate (LOPRESSOR) 25 MG tablet Take 0.5 tablets (12.5 mg total) by mouth 2 (two) times daily.  . Multiple Vitamins-Minerals (PRESERVISION AREDS 2 PO) Take 1 tablet by mouth daily.  Marland Kitchen omeprazole (PRILOSEC) 20 MG capsule Take 1 capsule (20 mg total) by mouth daily as needed (heartburn). (Patient not taking: Reported on 04/27/2018)  . oxyCODONE-acetaminophen (PERCOCET/ROXICET) 5-325 MG tablet Take 1 tablet by mouth every 4 (four) hours as needed for moderate pain.  Marland Kitchen PARoxetine (PAXIL) 40 MG tablet TAKE 1 TABLET BY MOUTH DAILY  . polyethylene glycol (MIRALAX / GLYCOLAX) packet Take 17 g by mouth daily.  . potassium chloride (K-DUR) 10 MEQ tablet TAKE 1 TABLET BY MOUTH DAILY.  Marland Kitchen Propylene Glycol (SYSTANE BALANCE) 0.6 % SOLN Place 1 drop into both eyes daily.  . quinapril (ACCUPRIL) 40 MG tablet TAKE 1 TABLET (40 MG TOTAL) BY MOUTH DAILY.  . simvastatin (ZOCOR) 5 MG tablet Take 1 tablet (5 mg total) by mouth every evening.  . thiamine 100 MG tablet Take 1 tablet (100 mg total) by mouth daily. (Patient not taking: Reported on 04/27/2018)  . XARELTO 15 MG TABS tablet TAKE 1 TABLET (15 MG TOTAL) BY MOUTH DAILY WITH SUPPER.   No facility-administered encounter medications on file as of 05/05/2018.     Allergies as of 05/05/2018 - Review Complete 05/05/2018  Allergen Reaction Noted  . Tape Other (See Comments) 01/23/2017    Past Medical History:  Diagnosis  Date  . Acute bronchitis 10/2017  . Arthritis   . CKD (chronic kidney disease)    Xarelto dose is 15 mg QD  . Coronary artery disease    a. s/p PCI/stenting to RCA and LAD ([facility] in [place]) in 2006; b. Nuc 1/15: No ischemia, EF 70%  . Depression   . Gastritis   . History of cholecystectomy   . History of echocardiogram    a. Echo 1/15: Moderate LVH, EF 55-60%, impaired relaxation, mild AS  . Hyperlipidemia    . Hypertension   . Hypokalemia   . Mobitz type 1 second degree atrioventricular block    a. Event Monitor 3/15: NSR, first-degree AV block, second-degree AB block - Mobitz 1, PACs, NSVT (5 beats)  . PAF (paroxysmal atrial fibrillation) (HCC)   . Thyroid disease     Past Surgical History:  Procedure Laterality Date  . BREAST LUMPECTOMY Left   . CARDIAC CATHETERIZATION    . CATARACT EXTRACTION Bilateral   . CHOLECYSTECTOMY    . DILATION AND CURETTAGE OF UTERUS    . EYE SURGERY      Family History  Problem Relation Age of Onset  . Heart disease Mother   . Heart attack Mother   . Lung cancer Father   . Cancer Father        lung    Social History   Socioeconomic History  . Marital status: Widowed    Spouse name: Not on file  . Number of children: Not on file  . Years of education: Not on file  . Highest education level: Not on file  Occupational History  . Not on file  Social Needs  . Financial resource strain: Not on file  . Food insecurity:    Worry: Not on file    Inability: Not on file  . Transportation needs:    Medical: Not on file    Non-medical: Not on file  Tobacco Use  . Smoking status: Former Smoker    Packs/day: 0.50    Years: 67.00    Pack years: 33.50    Types: Cigarettes    Last attempt to quit: 10/25/2015    Years since quitting: 2.5  . Smokeless tobacco: Never Used  . Tobacco comment: quit 2015  Substance and Sexual Activity  . Alcohol use: Yes    Alcohol/week: 6.0 oz    Types: 10 Shots of liquor per week  . Drug use: No  . Sexual activity: Never  Lifestyle  . Physical activity:    Days per week: Not on file    Minutes per session: Not on file  . Stress: Not on file  Relationships  . Social connections:    Talks on phone: Not on file    Gets together: Not on file    Attends religious service: Not on file    Active member of club or organization: Not on file    Attends meetings of clubs or organizations: Not on file    Relationship  status: Not on file  . Intimate partner violence:    Fear of current or ex partner: Not on file    Emotionally abused: Not on file    Physically abused: Not on file    Forced sexual activity: Not on file  Other Topics Concern  . Not on file  Social History Narrative  . Not on file    Review of systems: Review of Systems  Constitutional: Negative for fever and chills.  HENT: Negative.  Eyes: Negative for blurred vision.  Respiratory: as per HPI  Cardiovascular: Negative for chest pain and palpitations.  Gastrointestinal: Negative for vomiting, diarrhea, blood per rectum. Genitourinary: Negative for dysuria, urgency, frequency and hematuria.  Musculoskeletal: Negative for myalgias, back pain and joint pain.  Skin: Negative for itching and rash.  Neurological: Negative for dizziness, tremors, focal weakness, seizures and loss of consciousness.  Endo/Heme/Allergies: Negative for environmental allergies.  Psychiatric/Behavioral: Negative for depression, suicidal ideas and hallucinations.  All other systems reviewed and are negative.  Physical Exam: Blood pressure 118/80, pulse 85, height 5\' 2"  (1.575 m), weight 150 lb 9.6 oz (68.3 kg), SpO2 96 %. Gen:      No acute distress HEENT:  EOMI, sclera anicteric Neck:     No masses; no thyromegaly Lungs:    Clear to auscultation bilaterally; normal respiratory effort CV:         Regular rate and rhythm; no murmurs Abd:      + bowel sounds; soft, non-tender; no palpable masses, no distension Ext:    No edema; adequate peripheral perfusion Skin:      Warm and dry; no rash Neuro: alert and oriented x 3 Psych: normal mood and affect  Data Reviewed: Chest x-ray 11/09/17-chronic peribronchial thickening.  No lung infiltrate noted I have reviewed the images personally.  PFTs 05/05/2018 FVC 1.46 (66%), FEV1 1.18 [73%), F/F 81 No obstruction, restriction possible Unable to do lung volumes or diffusion capacity.  Assessment:  Follow-up  after bronchitis secondary to RSV infection She appears to have recovered well.  She is now off supplemental oxygen and has not been using inhalers No desats on exertion today  PFTs reviewed with no obstruction or evidence of COPD.  There may be restriction based on reduction in FVC and FEV1 however we are unable to do lung volume.  There is no evidence of interstitial lung disease on lung imaging. She does not need to be on controller medication.  She will continue on albuterol inhaler and Xopenex nebulizers as needed  Plan/Recommendations: -Continue albuterol as needed  Chilton Greathouse MD [facility] Pager [phone] 05/05/2018, 10:09 AM  CC: Julaine Fusi, NP

## 2018-05-08 ENCOUNTER — Other Ambulatory Visit: Payer: Self-pay | Admitting: Adult Health

## 2018-05-08 DIAGNOSIS — F339 Major depressive disorder, recurrent, unspecified: Secondary | ICD-10-CM | POA: Diagnosis not present

## 2018-05-08 DIAGNOSIS — I129 Hypertensive chronic kidney disease with stage 1 through stage 4 chronic kidney disease, or unspecified chronic kidney disease: Secondary | ICD-10-CM | POA: Diagnosis not present

## 2018-05-08 DIAGNOSIS — I480 Paroxysmal atrial fibrillation: Secondary | ICD-10-CM | POA: Diagnosis not present

## 2018-05-08 DIAGNOSIS — E039 Hypothyroidism, unspecified: Secondary | ICD-10-CM | POA: Diagnosis not present

## 2018-05-08 DIAGNOSIS — N182 Chronic kidney disease, stage 2 (mild): Secondary | ICD-10-CM | POA: Diagnosis not present

## 2018-05-08 DIAGNOSIS — F1010 Alcohol abuse, uncomplicated: Secondary | ICD-10-CM | POA: Diagnosis not present

## 2018-05-08 DIAGNOSIS — S42301D Unspecified fracture of shaft of humerus, right arm, subsequent encounter for fracture with routine healing: Secondary | ICD-10-CM | POA: Diagnosis not present

## 2018-05-08 DIAGNOSIS — I2510 Atherosclerotic heart disease of native coronary artery without angina pectoris: Secondary | ICD-10-CM | POA: Diagnosis not present

## 2018-05-08 DIAGNOSIS — E785 Hyperlipidemia, unspecified: Secondary | ICD-10-CM | POA: Diagnosis not present

## 2018-05-10 DIAGNOSIS — S42301D Unspecified fracture of shaft of humerus, right arm, subsequent encounter for fracture with routine healing: Secondary | ICD-10-CM | POA: Diagnosis not present

## 2018-05-10 DIAGNOSIS — N182 Chronic kidney disease, stage 2 (mild): Secondary | ICD-10-CM | POA: Diagnosis not present

## 2018-05-10 DIAGNOSIS — I129 Hypertensive chronic kidney disease with stage 1 through stage 4 chronic kidney disease, or unspecified chronic kidney disease: Secondary | ICD-10-CM | POA: Diagnosis not present

## 2018-05-10 DIAGNOSIS — E785 Hyperlipidemia, unspecified: Secondary | ICD-10-CM | POA: Diagnosis not present

## 2018-05-10 DIAGNOSIS — I2510 Atherosclerotic heart disease of native coronary artery without angina pectoris: Secondary | ICD-10-CM | POA: Diagnosis not present

## 2018-05-10 DIAGNOSIS — F1010 Alcohol abuse, uncomplicated: Secondary | ICD-10-CM | POA: Diagnosis not present

## 2018-05-10 DIAGNOSIS — I480 Paroxysmal atrial fibrillation: Secondary | ICD-10-CM | POA: Diagnosis not present

## 2018-05-10 DIAGNOSIS — F339 Major depressive disorder, recurrent, unspecified: Secondary | ICD-10-CM | POA: Diagnosis not present

## 2018-05-10 DIAGNOSIS — E039 Hypothyroidism, unspecified: Secondary | ICD-10-CM | POA: Diagnosis not present

## 2018-05-11 DIAGNOSIS — I480 Paroxysmal atrial fibrillation: Secondary | ICD-10-CM | POA: Diagnosis not present

## 2018-05-11 DIAGNOSIS — E785 Hyperlipidemia, unspecified: Secondary | ICD-10-CM | POA: Diagnosis not present

## 2018-05-11 DIAGNOSIS — S42301D Unspecified fracture of shaft of humerus, right arm, subsequent encounter for fracture with routine healing: Secondary | ICD-10-CM | POA: Diagnosis not present

## 2018-05-11 DIAGNOSIS — I129 Hypertensive chronic kidney disease with stage 1 through stage 4 chronic kidney disease, or unspecified chronic kidney disease: Secondary | ICD-10-CM | POA: Diagnosis not present

## 2018-05-11 DIAGNOSIS — F1010 Alcohol abuse, uncomplicated: Secondary | ICD-10-CM | POA: Diagnosis not present

## 2018-05-11 DIAGNOSIS — N182 Chronic kidney disease, stage 2 (mild): Secondary | ICD-10-CM | POA: Diagnosis not present

## 2018-05-11 DIAGNOSIS — E039 Hypothyroidism, unspecified: Secondary | ICD-10-CM | POA: Diagnosis not present

## 2018-05-11 DIAGNOSIS — F339 Major depressive disorder, recurrent, unspecified: Secondary | ICD-10-CM | POA: Diagnosis not present

## 2018-05-11 DIAGNOSIS — I2510 Atherosclerotic heart disease of native coronary artery without angina pectoris: Secondary | ICD-10-CM | POA: Diagnosis not present

## 2018-05-12 DIAGNOSIS — F1010 Alcohol abuse, uncomplicated: Secondary | ICD-10-CM | POA: Diagnosis not present

## 2018-05-12 DIAGNOSIS — I480 Paroxysmal atrial fibrillation: Secondary | ICD-10-CM | POA: Diagnosis not present

## 2018-05-12 DIAGNOSIS — F339 Major depressive disorder, recurrent, unspecified: Secondary | ICD-10-CM | POA: Diagnosis not present

## 2018-05-12 DIAGNOSIS — E039 Hypothyroidism, unspecified: Secondary | ICD-10-CM | POA: Diagnosis not present

## 2018-05-12 DIAGNOSIS — N182 Chronic kidney disease, stage 2 (mild): Secondary | ICD-10-CM | POA: Diagnosis not present

## 2018-05-12 DIAGNOSIS — E785 Hyperlipidemia, unspecified: Secondary | ICD-10-CM | POA: Diagnosis not present

## 2018-05-12 DIAGNOSIS — I2510 Atherosclerotic heart disease of native coronary artery without angina pectoris: Secondary | ICD-10-CM | POA: Diagnosis not present

## 2018-05-12 DIAGNOSIS — S42301D Unspecified fracture of shaft of humerus, right arm, subsequent encounter for fracture with routine healing: Secondary | ICD-10-CM | POA: Diagnosis not present

## 2018-05-12 DIAGNOSIS — I129 Hypertensive chronic kidney disease with stage 1 through stage 4 chronic kidney disease, or unspecified chronic kidney disease: Secondary | ICD-10-CM | POA: Diagnosis not present

## 2018-05-15 DIAGNOSIS — I2510 Atherosclerotic heart disease of native coronary artery without angina pectoris: Secondary | ICD-10-CM | POA: Diagnosis not present

## 2018-05-15 DIAGNOSIS — S42301D Unspecified fracture of shaft of humerus, right arm, subsequent encounter for fracture with routine healing: Secondary | ICD-10-CM | POA: Diagnosis not present

## 2018-05-15 DIAGNOSIS — E039 Hypothyroidism, unspecified: Secondary | ICD-10-CM | POA: Diagnosis not present

## 2018-05-15 DIAGNOSIS — I129 Hypertensive chronic kidney disease with stage 1 through stage 4 chronic kidney disease, or unspecified chronic kidney disease: Secondary | ICD-10-CM | POA: Diagnosis not present

## 2018-05-15 DIAGNOSIS — I480 Paroxysmal atrial fibrillation: Secondary | ICD-10-CM | POA: Diagnosis not present

## 2018-05-15 DIAGNOSIS — N182 Chronic kidney disease, stage 2 (mild): Secondary | ICD-10-CM | POA: Diagnosis not present

## 2018-05-15 DIAGNOSIS — F339 Major depressive disorder, recurrent, unspecified: Secondary | ICD-10-CM | POA: Diagnosis not present

## 2018-05-15 DIAGNOSIS — F1010 Alcohol abuse, uncomplicated: Secondary | ICD-10-CM | POA: Diagnosis not present

## 2018-05-15 DIAGNOSIS — E785 Hyperlipidemia, unspecified: Secondary | ICD-10-CM | POA: Diagnosis not present

## 2018-05-16 DIAGNOSIS — S42301D Unspecified fracture of shaft of humerus, right arm, subsequent encounter for fracture with routine healing: Secondary | ICD-10-CM | POA: Diagnosis not present

## 2018-05-16 DIAGNOSIS — I129 Hypertensive chronic kidney disease with stage 1 through stage 4 chronic kidney disease, or unspecified chronic kidney disease: Secondary | ICD-10-CM | POA: Diagnosis not present

## 2018-05-16 DIAGNOSIS — I2510 Atherosclerotic heart disease of native coronary artery without angina pectoris: Secondary | ICD-10-CM | POA: Diagnosis not present

## 2018-05-16 DIAGNOSIS — E039 Hypothyroidism, unspecified: Secondary | ICD-10-CM | POA: Diagnosis not present

## 2018-05-16 DIAGNOSIS — F1010 Alcohol abuse, uncomplicated: Secondary | ICD-10-CM | POA: Diagnosis not present

## 2018-05-16 DIAGNOSIS — N182 Chronic kidney disease, stage 2 (mild): Secondary | ICD-10-CM | POA: Diagnosis not present

## 2018-05-16 DIAGNOSIS — I480 Paroxysmal atrial fibrillation: Secondary | ICD-10-CM | POA: Diagnosis not present

## 2018-05-16 DIAGNOSIS — F339 Major depressive disorder, recurrent, unspecified: Secondary | ICD-10-CM | POA: Diagnosis not present

## 2018-05-16 DIAGNOSIS — E785 Hyperlipidemia, unspecified: Secondary | ICD-10-CM | POA: Diagnosis not present

## 2018-05-17 DIAGNOSIS — F1010 Alcohol abuse, uncomplicated: Secondary | ICD-10-CM | POA: Diagnosis not present

## 2018-05-17 DIAGNOSIS — E785 Hyperlipidemia, unspecified: Secondary | ICD-10-CM | POA: Diagnosis not present

## 2018-05-17 DIAGNOSIS — I2510 Atherosclerotic heart disease of native coronary artery without angina pectoris: Secondary | ICD-10-CM | POA: Diagnosis not present

## 2018-05-17 DIAGNOSIS — S42301D Unspecified fracture of shaft of humerus, right arm, subsequent encounter for fracture with routine healing: Secondary | ICD-10-CM | POA: Diagnosis not present

## 2018-05-17 DIAGNOSIS — E039 Hypothyroidism, unspecified: Secondary | ICD-10-CM | POA: Diagnosis not present

## 2018-05-17 DIAGNOSIS — I480 Paroxysmal atrial fibrillation: Secondary | ICD-10-CM | POA: Diagnosis not present

## 2018-05-17 DIAGNOSIS — N182 Chronic kidney disease, stage 2 (mild): Secondary | ICD-10-CM | POA: Diagnosis not present

## 2018-05-17 DIAGNOSIS — F339 Major depressive disorder, recurrent, unspecified: Secondary | ICD-10-CM | POA: Diagnosis not present

## 2018-05-17 DIAGNOSIS — I129 Hypertensive chronic kidney disease with stage 1 through stage 4 chronic kidney disease, or unspecified chronic kidney disease: Secondary | ICD-10-CM | POA: Diagnosis not present

## 2018-05-19 ENCOUNTER — Other Ambulatory Visit: Payer: Self-pay | Admitting: Chiropractic Medicine

## 2018-05-19 ENCOUNTER — Ambulatory Visit
Admission: RE | Admit: 2018-05-19 | Discharge: 2018-05-19 | Disposition: A | Discharge status: Home / Self Care | Payer: Medicare PPO | Source: Ambulatory Visit | Attending: Chiropractic Medicine | Admitting: Chiropractic Medicine

## 2018-05-19 DIAGNOSIS — R52 Pain, unspecified: Secondary | ICD-10-CM

## 2018-05-19 DIAGNOSIS — M5136 Other intervertebral disc degeneration, lumbar region: Secondary | ICD-10-CM | POA: Diagnosis not present

## 2018-05-25 ENCOUNTER — Ambulatory Visit: Payer: Medicare PPO | Admitting: Adult Health

## 2018-05-25 DIAGNOSIS — E039 Hypothyroidism, unspecified: Secondary | ICD-10-CM | POA: Diagnosis not present

## 2018-05-25 DIAGNOSIS — I2510 Atherosclerotic heart disease of native coronary artery without angina pectoris: Secondary | ICD-10-CM | POA: Diagnosis not present

## 2018-05-25 DIAGNOSIS — F339 Major depressive disorder, recurrent, unspecified: Secondary | ICD-10-CM | POA: Diagnosis not present

## 2018-05-25 DIAGNOSIS — N182 Chronic kidney disease, stage 2 (mild): Secondary | ICD-10-CM | POA: Diagnosis not present

## 2018-05-25 DIAGNOSIS — I129 Hypertensive chronic kidney disease with stage 1 through stage 4 chronic kidney disease, or unspecified chronic kidney disease: Secondary | ICD-10-CM | POA: Diagnosis not present

## 2018-05-25 DIAGNOSIS — I480 Paroxysmal atrial fibrillation: Secondary | ICD-10-CM | POA: Diagnosis not present

## 2018-05-25 DIAGNOSIS — F1010 Alcohol abuse, uncomplicated: Secondary | ICD-10-CM | POA: Diagnosis not present

## 2018-05-25 DIAGNOSIS — S42301D Unspecified fracture of shaft of humerus, right arm, subsequent encounter for fracture with routine healing: Secondary | ICD-10-CM | POA: Diagnosis not present

## 2018-05-25 DIAGNOSIS — E785 Hyperlipidemia, unspecified: Secondary | ICD-10-CM | POA: Diagnosis not present

## 2018-05-27 DIAGNOSIS — F339 Major depressive disorder, recurrent, unspecified: Secondary | ICD-10-CM | POA: Diagnosis not present

## 2018-05-27 DIAGNOSIS — S42301D Unspecified fracture of shaft of humerus, right arm, subsequent encounter for fracture with routine healing: Secondary | ICD-10-CM | POA: Diagnosis not present

## 2018-05-27 DIAGNOSIS — E039 Hypothyroidism, unspecified: Secondary | ICD-10-CM | POA: Diagnosis not present

## 2018-05-27 DIAGNOSIS — N182 Chronic kidney disease, stage 2 (mild): Secondary | ICD-10-CM | POA: Diagnosis not present

## 2018-05-27 DIAGNOSIS — I480 Paroxysmal atrial fibrillation: Secondary | ICD-10-CM | POA: Diagnosis not present

## 2018-05-27 DIAGNOSIS — I129 Hypertensive chronic kidney disease with stage 1 through stage 4 chronic kidney disease, or unspecified chronic kidney disease: Secondary | ICD-10-CM | POA: Diagnosis not present

## 2018-05-27 DIAGNOSIS — E785 Hyperlipidemia, unspecified: Secondary | ICD-10-CM | POA: Diagnosis not present

## 2018-05-27 DIAGNOSIS — I2510 Atherosclerotic heart disease of native coronary artery without angina pectoris: Secondary | ICD-10-CM | POA: Diagnosis not present

## 2018-05-27 DIAGNOSIS — F1010 Alcohol abuse, uncomplicated: Secondary | ICD-10-CM | POA: Diagnosis not present

## 2018-06-03 ENCOUNTER — Encounter (HOSPITAL_COMMUNITY): Payer: Self-pay

## 2018-06-03 ENCOUNTER — Observation Stay (HOSPITAL_COMMUNITY)
Admission: EM | Admit: 2018-06-03 | Discharge: 2018-06-07 | Disposition: A | Discharge status: Skilled Nursing Facility | Payer: Medicare PPO | Attending: Internal Medicine | Admitting: Internal Medicine

## 2018-06-03 ENCOUNTER — Other Ambulatory Visit: Payer: Self-pay

## 2018-06-03 ENCOUNTER — Emergency Department (HOSPITAL_COMMUNITY): Payer: Medicare PPO

## 2018-06-03 DIAGNOSIS — Z8249 Family history of ischemic heart disease and other diseases of the circulatory system: Secondary | ICD-10-CM | POA: Diagnosis not present

## 2018-06-03 DIAGNOSIS — K449 Diaphragmatic hernia without obstruction or gangrene: Secondary | ICD-10-CM | POA: Diagnosis not present

## 2018-06-03 DIAGNOSIS — R05 Cough: Secondary | ICD-10-CM | POA: Diagnosis not present

## 2018-06-03 DIAGNOSIS — S72112A Displaced fracture of greater trochanter of left femur, initial encounter for closed fracture: Principal | ICD-10-CM | POA: Insufficient documentation

## 2018-06-03 DIAGNOSIS — K219 Gastro-esophageal reflux disease without esophagitis: Secondary | ICD-10-CM | POA: Insufficient documentation

## 2018-06-03 DIAGNOSIS — J9601 Acute respiratory failure with hypoxia: Secondary | ICD-10-CM | POA: Insufficient documentation

## 2018-06-03 DIAGNOSIS — M79652 Pain in left thigh: Secondary | ICD-10-CM | POA: Diagnosis not present

## 2018-06-03 DIAGNOSIS — I10 Essential (primary) hypertension: Secondary | ICD-10-CM | POA: Diagnosis not present

## 2018-06-03 DIAGNOSIS — Z7901 Long term (current) use of anticoagulants: Secondary | ICD-10-CM | POA: Insufficient documentation

## 2018-06-03 DIAGNOSIS — F419 Anxiety disorder, unspecified: Secondary | ICD-10-CM | POA: Insufficient documentation

## 2018-06-03 DIAGNOSIS — E785 Hyperlipidemia, unspecified: Secondary | ICD-10-CM | POA: Diagnosis not present

## 2018-06-03 DIAGNOSIS — I480 Paroxysmal atrial fibrillation: Secondary | ICD-10-CM | POA: Diagnosis not present

## 2018-06-03 DIAGNOSIS — I2510 Atherosclerotic heart disease of native coronary artery without angina pectoris: Secondary | ICD-10-CM | POA: Diagnosis present

## 2018-06-03 DIAGNOSIS — W19XXXA Unspecified fall, initial encounter: Secondary | ICD-10-CM

## 2018-06-03 DIAGNOSIS — Y92002 Bathroom of unspecified non-institutional (private) residence single-family (private) house as the place of occurrence of the external cause: Secondary | ICD-10-CM | POA: Insufficient documentation

## 2018-06-03 DIAGNOSIS — I129 Hypertensive chronic kidney disease with stage 1 through stage 4 chronic kidney disease, or unspecified chronic kidney disease: Secondary | ICD-10-CM | POA: Insufficient documentation

## 2018-06-03 DIAGNOSIS — R0902 Hypoxemia: Secondary | ICD-10-CM | POA: Diagnosis present

## 2018-06-03 DIAGNOSIS — R202 Paresthesia of skin: Secondary | ICD-10-CM | POA: Diagnosis not present

## 2018-06-03 DIAGNOSIS — Z888 Allergy status to other drugs, medicaments and biological substances status: Secondary | ICD-10-CM | POA: Insufficient documentation

## 2018-06-03 DIAGNOSIS — R52 Pain, unspecified: Secondary | ICD-10-CM

## 2018-06-03 DIAGNOSIS — N183 Chronic kidney disease, stage 3 (moderate): Secondary | ICD-10-CM | POA: Diagnosis not present

## 2018-06-03 DIAGNOSIS — Z87891 Personal history of nicotine dependence: Secondary | ICD-10-CM | POA: Insufficient documentation

## 2018-06-03 DIAGNOSIS — I441 Atrioventricular block, second degree: Secondary | ICD-10-CM | POA: Diagnosis not present

## 2018-06-03 DIAGNOSIS — D539 Nutritional anemia, unspecified: Secondary | ICD-10-CM | POA: Diagnosis not present

## 2018-06-03 DIAGNOSIS — Z79899 Other long term (current) drug therapy: Secondary | ICD-10-CM | POA: Insufficient documentation

## 2018-06-03 DIAGNOSIS — Z955 Presence of coronary angioplasty implant and graft: Secondary | ICD-10-CM | POA: Diagnosis not present

## 2018-06-03 DIAGNOSIS — S79912A Unspecified injury of left hip, initial encounter: Secondary | ICD-10-CM | POA: Diagnosis not present

## 2018-06-03 DIAGNOSIS — E039 Hypothyroidism, unspecified: Secondary | ICD-10-CM | POA: Diagnosis not present

## 2018-06-03 DIAGNOSIS — I4891 Unspecified atrial fibrillation: Secondary | ICD-10-CM | POA: Diagnosis not present

## 2018-06-03 DIAGNOSIS — M1990 Unspecified osteoarthritis, unspecified site: Secondary | ICD-10-CM | POA: Diagnosis not present

## 2018-06-03 DIAGNOSIS — M25552 Pain in left hip: Secondary | ICD-10-CM | POA: Diagnosis not present

## 2018-06-03 DIAGNOSIS — W010XXA Fall on same level from slipping, tripping and stumbling without subsequent striking against object, initial encounter: Secondary | ICD-10-CM | POA: Diagnosis not present

## 2018-06-03 DIAGNOSIS — F329 Major depressive disorder, single episode, unspecified: Secondary | ICD-10-CM | POA: Insufficient documentation

## 2018-06-03 DIAGNOSIS — J90 Pleural effusion, not elsewhere classified: Secondary | ICD-10-CM | POA: Diagnosis not present

## 2018-06-03 DIAGNOSIS — R32 Unspecified urinary incontinence: Secondary | ICD-10-CM | POA: Insufficient documentation

## 2018-06-03 DIAGNOSIS — S72113A Displaced fracture of greater trochanter of unspecified femur, initial encounter for closed fracture: Secondary | ICD-10-CM | POA: Diagnosis present

## 2018-06-03 LAB — CBC WITH DIFFERENTIAL/PLATELET
BASOS ABS: 0 10*3/uL (ref 0.0–0.1)
BASOS PCT: 0 %
Eosinophils Absolute: 0.3 10*3/uL (ref 0.0–0.7)
Eosinophils Relative: 3 %
HEMATOCRIT: 36.4 % (ref 36.0–46.0)
HEMOGLOBIN: 11.9 g/dL — AB (ref 12.0–15.0)
Lymphocytes Relative: 15 %
Lymphs Abs: 1.4 10*3/uL (ref 0.7–4.0)
MCH: 32.6 pg (ref 26.0–34.0)
MCHC: 32.7 g/dL (ref 30.0–36.0)
MCV: 99.7 fL (ref 78.0–100.0)
MONO ABS: 1 10*3/uL (ref 0.1–1.0)
MONOS PCT: 11 %
NEUTROS ABS: 6.7 10*3/uL (ref 1.7–7.7)
NEUTROS PCT: 71 %
Platelets: 188 10*3/uL (ref 150–400)
RBC: 3.65 MIL/uL — ABNORMAL LOW (ref 3.87–5.11)
RDW: 12.6 % (ref 11.5–15.5)
WBC: 9.4 10*3/uL (ref 4.0–10.5)

## 2018-06-03 LAB — COMPREHENSIVE METABOLIC PANEL
ALBUMIN: 3.4 g/dL — AB (ref 3.5–5.0)
ALK PHOS: 51 U/L (ref 38–126)
ALT: 8 U/L — ABNORMAL LOW (ref 14–54)
AST: 19 U/L (ref 15–41)
Anion gap: 11 (ref 5–15)
BILIRUBIN TOTAL: 0.9 mg/dL (ref 0.3–1.2)
BUN: 18 mg/dL (ref 6–20)
CALCIUM: 8.8 mg/dL — AB (ref 8.9–10.3)
CO2: 26 mmol/L (ref 22–32)
CREATININE: 0.76 mg/dL (ref 0.44–1.00)
Chloride: 105 mmol/L (ref 101–111)
GFR calc Af Amer: >60 mL/min (ref >60)
GFR calc non Af Amer: >60 mL/min (ref >60)
GLUCOSE: 96 mg/dL (ref 65–99)
POTASSIUM: 3.5 mmol/L (ref 3.5–5.1)
Sodium: 142 mmol/L (ref 135–145)
TOTAL PROTEIN: 6.6 g/dL (ref 6.5–8.1)

## 2018-06-03 MED ORDER — FENTANYL CITRATE (PF) 100 MCG/2ML IJ SOLN
50.0000 ug | Freq: Once | INTRAMUSCULAR | Status: AC
  Administered 2018-06-03: 50 ug via INTRAVENOUS
  Filled 2018-06-03: qty 2

## 2018-06-03 NOTE — ED Notes (Addendum)
Pt attempted to be ambulated but very unsteady without assistance and oxygen saturation decreased down to 82% on room air Dr.Tegeler notified of oxygen saturation and ambulation results.

## 2018-06-03 NOTE — ED Triage Notes (Signed)
She reports falling upon losing her balance in her bathroom. She was able to stand without difficulty and get back into her bed. After being in bed a bit, she tried to get up and noted left hip/pelvic area pain. She arrives here in no distress. She also tells [place] that she had been on O2 in the remote past. EMS found her to have SPO2 in the 80's. CMS intact all extremities.

## 2018-06-03 NOTE — ED Notes (Signed)
Bed: WA14 Expected date:  Expected time:  Means of arrival:  Comments: 84 yo fall, hip pain 

## 2018-06-03 NOTE — ED Provider Notes (Signed)
[facility] Provider Note   CSN: [id number] Arrival date & time: 06/03/18  1718     History   Chief Complaint Chief Complaint  Patient presents with  . Fall    HPI Christina Ortiz is a 84 y.o. female.  The history is provided by the patient and medical records. No language interpreter was used.  Fall  This is a new problem. The current episode started 1 to 2 hours ago. The problem occurs constantly. The problem has not changed since onset.Associated symptoms include shortness of breath. Pertinent negatives include no chest pain, no abdominal pain and no headaches. Nothing aggravates the symptoms. Nothing relieves the symptoms. She has tried nothing for the symptoms. The treatment provided no relief.  Hip Pain  This is a new problem. The current episode started 1 to 2 hours ago. The problem occurs constantly. The problem has not changed since onset.Associated symptoms include shortness of breath. Pertinent negatives include no chest pain, no abdominal pain and no headaches. The symptoms are aggravated by twisting. The symptoms are relieved by rest. The treatment provided no relief.    Past Medical History:  Diagnosis Date  . Acute bronchitis 10/2017  . Arthritis   . CKD (chronic kidney disease)    Xarelto dose is 15 mg QD  . Coronary artery disease    a. s/p PCI/stenting to RCA and LAD ([facility] in [place]) in 2006; b. Nuc 1/15: No ischemia, EF 70%  . Depression   . Gastritis   . History of cholecystectomy   . History of echocardiogram    a. Echo 1/15: Moderate LVH, EF 55-60%, impaired relaxation, mild AS  . Hyperlipidemia   . Hypertension   . Hypokalemia   . Mobitz type 1 second degree atrioventricular block    a. Event Monitor 3/15: NSR, first-degree AV block, second-degree AB block - Mobitz 1, PACs, NSVT (5 beats)  . PAF (paroxysmal atrial fibrillation) (HCC)   . Thyroid disease     Patient Active Problem List   Diagnosis Date  Noted  . Hospital discharge follow-up 04/27/2018  . Fall 03/31/2018  . Humerus shaft fracture-right 03/31/2018  . Rhabdomyolysis 03/31/2018  . Hypothermia 03/31/2018  . Healthcare maintenance 01/17/2018  . Nocturia 01/17/2018  . Macrocytosis 11/22/2017  . Acute bronchitis due to respiratory syncytial virus (RSV) 11/09/2017  . Acute respiratory failure with hypoxia (HCC) 11/09/2017  . Hypertension 11/09/2017  . PAF (paroxysmal atrial fibrillation) (HCC) 11/09/2017  . Coronary artery disease 11/09/2017  . CKD (chronic kidney disease) stage 2, GFR 60-89 ml/min 11/09/2017  . Hypothyroidism 11/09/2017  . Dyspnea 11/08/2017  . Upper respiratory tract infection 11/08/2017  . Elevated serum creatinine 06/21/2017  . Edema of both ankles 06/21/2017  . Diarrhea 05/25/2017  . Hypokalemia 05/25/2017  . Alcohol abuse 05/25/2017  . Depression, recurrent (HCC) 03/30/2017  . Lumbago 03/30/2017  . Influenza with respiratory manifestation 01/22/2017  . Coronary artery disease due to lipid rich plaque 04/04/2015  . Essential hypertension 04/04/2015  . Hyperlipidemia 04/04/2015  . Former smoker 04/04/2015  . Chronic anticoagulation 04/04/2015    Past Surgical History:  Procedure Laterality Date  . BREAST LUMPECTOMY Left   . CARDIAC CATHETERIZATION    . CATARACT EXTRACTION Bilateral   . CHOLECYSTECTOMY    . DILATION AND CURETTAGE OF UTERUS    . EYE SURGERY       OB History   None      Home Medications    Prior to Admission medications  Medication Sig Start Date End Date Taking? Authorizing Provider  albuterol (PROVENTIL HFA;VENTOLIN HFA) 108 (90 Base) MCG/ACT inhaler Inhale 2 puffs into the lungs every 6 (six) hours as needed for wheezing or shortness of breath. 05/05/18   Mannam, Colbert Coyer, MD  amLODipine (NORVASC) 5 MG tablet Take 1 tablet (5 mg total) by mouth daily. 04/27/18   Danford, Orpha Bur D, NP  cholecalciferol (VITAMIN D) 1000 units tablet Take 1,000 Units by mouth every evening.     [provider]  doxazosin (CARDURA) 8 MG tablet TAKE 1 TABLET (8 MG TOTAL) BY MOUTH DAILY. 02/13/18   Danford, Orpha Bur D, NP  folic acid (FOLVITE) 1 MG tablet Take 1 tablet (1 mg total) by mouth daily. 04/05/18   Regalado, Belkys A, MD  levothyroxine (SYNTHROID, LEVOTHROID) 75 MCG tablet Take 1 tablet (75 mcg total) by mouth daily before breakfast. 01/25/18   Danford, Orpha Bur D, NP  metoprolol tartrate (LOPRESSOR) 25 MG tablet Take 0.5 tablets (12.5 mg total) by mouth 2 (two) times daily. 04/27/18   Danford, Orpha Bur D, NP  Multiple Vitamins-Minerals (PRESERVISION AREDS 2 PO) Take 1 tablet by mouth daily.    [provider]  omeprazole (PRILOSEC) 20 MG capsule Take 1 capsule (20 mg total) by mouth daily as needed (heartburn). Patient not taking: Reported on 04/27/2018 11/16/17   William Hamburger D, NP  oxyCODONE-acetaminophen (PERCOCET/ROXICET) 5-325 MG tablet Take 1 tablet by mouth every 4 (four) hours as needed for moderate pain. 04/04/18   Regalado, Belkys A, MD  PARoxetine (PAXIL) 40 MG tablet TAKE 1 TABLET BY MOUTH DAILY 04/18/18   Danford, Orpha Bur D, NP  polyethylene glycol (MIRALAX / GLYCOLAX) packet Take 17 g by mouth daily. 04/05/18   Regalado, Belkys A, MD  potassium chloride (K-DUR) 10 MEQ tablet TAKE 1 TABLET BY MOUTH DAILY. 04/18/18   Danford, Orpha Bur D, NP  Propylene Glycol (SYSTANE BALANCE) 0.6 % SOLN Place 1 drop into both eyes daily.    [provider]  quinapril (ACCUPRIL) 40 MG tablet TAKE 1 TABLET (40 MG TOTAL) BY MOUTH DAILY. 02/13/18   Danford, Orpha Bur D, NP  simvastatin (ZOCOR) 5 MG tablet Take 1 tablet (5 mg total) by mouth every evening. 09/06/17   Danford, Orpha Bur D, NP  simvastatin (ZOCOR) 5 MG tablet TAKE 1 TABLET BY MOUTH EVERY EVENING. 05/08/18   Danford, Orpha Bur D, NP  thiamine 100 MG tablet Take 1 tablet (100 mg total) by mouth daily. Patient not taking: Reported on 04/27/2018 04/05/18   Regalado, Belkys A, MD  XARELTO 15 MG TABS tablet TAKE 1 TABLET (15 MG TOTAL) BY MOUTH DAILY  WITH SUPPER. 11/08/17   Jake Bathe, MD    Family History Family History  Problem Relation Age of Onset  . Heart disease Mother   . Heart attack Mother   . Lung cancer Father   . Cancer Father        lung    Social History Social History   Tobacco Use  . Smoking status: Former Smoker    Packs/day: 0.50    Years: 67.00    Pack years: 33.50    Types: Cigarettes    Last attempt to quit: 10/25/2015    Years since quitting: 2.6  . Smokeless tobacco: Never Used  . Tobacco comment: quit 2015  Substance Use Topics  . Alcohol use: Yes    Alcohol/week: 6.0 oz    Types: 10 Shots of liquor per week  . Drug use: No     Allergies  Tape   Review of Systems Review of Systems  Constitutional: Negative for chills, diaphoresis, fatigue and fever.  HENT: Negative for congestion and rhinorrhea.   Eyes: Negative for visual disturbance.  Respiratory: Positive for cough and shortness of breath. Negative for chest tightness and wheezing.   Cardiovascular: Negative for chest pain, palpitations and leg swelling.  Gastrointestinal: Negative for abdominal pain, diarrhea, nausea and vomiting.  Genitourinary: Negative for dysuria and flank pain.  Musculoskeletal: Negative for back pain, neck pain and neck stiffness.  Skin: Negative for rash and wound.  Neurological: Negative for weakness, light-headedness, numbness and headaches.  Psychiatric/Behavioral: Negative for agitation and confusion.  All other systems reviewed and are negative.    Physical Exam Updated Vital Signs BP 120/83   Pulse 69   Temp 97.7 F (36.5 C) (Oral)   Resp (!) 23   Ht 5\' 3"  (1.6 m)   Wt 68.5 kg (151 lb)   SpO2 96%   BMI 26.75 kg/m   Physical Exam  Constitutional: She is oriented to person, place, and time. She appears well-developed and well-nourished. No distress.  HENT:  Head: Normocephalic.  Nose: Nose normal.  Mouth/Throat: Oropharynx is clear and moist. No oropharyngeal exudate.  Eyes:  Conjunctivae are normal.  Neck: Normal range of motion. Neck supple.  Cardiovascular: Normal rate and intact distal pulses.  Murmur (faint) heard. Pulmonary/Chest: Effort normal. No respiratory distress. She has no wheezes. She exhibits no tenderness.  Abdominal: Soft. Bowel sounds are normal. There is no tenderness.  Musculoskeletal: She exhibits tenderness. She exhibits no edema or deformity.       Left hip: She exhibits tenderness. She exhibits no laceration.       Legs: Lymphadenopathy:    She has no cervical adenopathy.  Neurological: She is alert and oriented to person, place, and time. No sensory deficit. She exhibits normal muscle tone.  Skin: Capillary refill takes less than 2 seconds. She is not diaphoretic. No erythema.  Psychiatric: She has a normal mood and affect.  Nursing note and vitals reviewed.    ED Treatments / Results  Labs (all labs ordered are listed, but only abnormal results are displayed) Labs Reviewed  CBC WITH DIFFERENTIAL/PLATELET - Abnormal; Notable for the following components:      Result Value   RBC 3.65 (*)    Hemoglobin 11.9 (*)    All other components within normal limits  COMPREHENSIVE METABOLIC PANEL - Abnormal; Notable for the following components:   Calcium 8.8 (*)    Albumin 3.4 (*)    ALT 8 (*)    All other components within normal limits    EKG EKG Interpretation  Date/Time:  Saturday June 03 2018 18:24:49 EDT Ventricular Rate:  72 PR Interval:    QRS Duration: 174 QT Interval:  447 QTC Calculation: 476 R Axis:   -116 Text Interpretation:  Atrial fibrillation Right bundle branch block When compared to prior, now Afib.  No STEMI Confirmed by Theda Belfast ([id number]) on 06/03/2018 6:57:12 PM   Radiology Dg Chest 2 View  Result Date: 06/03/2018 CLINICAL DATA:  Cough and hypoxia.  Recent fall. EXAM: CHEST - 2 VIEW COMPARISON:  04/27/2018 FINDINGS: Marked cardiac enlargement remains stable. Aortic atherosclerosis. Large hiatal hernia  is again seen. No evidence of pulmonary infiltrate or edema. No evidence of pneumothorax or pleural effusion. IMPRESSION: Stable marked cardiac enlargement and large hiatal hernia. No active lung disease. Electronically Signed   By: Myles Rosenthal M.D.   On: 06/03/2018 18:46  Ct Pelvis Wo Contrast  Result Date: 06/03/2018 CLINICAL DATA:  Lost balance in bathroom and fell with resulting left hip/pelvic pain. EXAM: CT PELVIS WITHOUT CONTRAST TECHNIQUE: Multidetector CT imaging of the pelvis was performed following the standard protocol without intravenous contrast. COMPARISON:  Plain films today. FINDINGS: Urinary Tract: Partially visualize exophytic cyst over the lower pole left kidney. Visualized ureters and bladder are normal. Bowel: Minimal diverticulosis of the colon. Appendix not visualized. Visualize small bowel within normal. Vascular/Lymphatic: Moderate calcified plaque over the abdominal aorta and iliac arteries. No adenopathy. Reproductive:  Within normal. Other:  No free fluid or focal inflammatory change. Musculoskeletal: Subtle minimally displaced fracture of the greater trochanter of the left proximal femur. Minimal degenerative change of the hips bilaterally. Degenerate change of the spine with disc disease the L4-5 and L5-S1 levels. Suggestion of subtle grade 1 anterolisthesis of L4 on L5. Severe bilateral neural foraminal narrowing at the L4-5 and L5-S1 levels. IMPRESSION: Very minimally displaced fracture of the greater trochanter of the left proximal femur. Additional chronic findings as described. Electronically Signed   By: Elberta Fortis M.D.   On: 06/03/2018 20:18   Dg Hip Unilat W Or Wo Pelvis 2-3 Views Left  Result Date: 06/03/2018 CLINICAL DATA:  Fall in bathroom today. Left hip pain. Initial encounter. EXAM: DG HIP (WITH OR WITHOUT PELVIS) 2-3V LEFT COMPARISON:  None. FINDINGS: There is no evidence of hip fracture or dislocation. There is no evidence of arthropathy or other focal bone  abnormality. IMPRESSION: Negative. Electronically Signed   By: Myles Rosenthal M.D.   On: 06/03/2018 18:47   Dg Femur Min 2 Views Left  Result Date: 06/03/2018 CLINICAL DATA:  Fall in bathroom today. Left femur pain. Initial encounter. EXAM: LEFT FEMUR 2 VIEWS COMPARISON:  None. FINDINGS: There is no evidence of fracture or other focal bone lesions. Generalized osteopenia noted. Mild peripheral vascular calcification also seen. IMPRESSION: No acute findings. Electronically Signed   By: Myles Rosenthal M.D.   On: 06/03/2018 18:47    Procedures Procedures (including critical care time)  Medications Ordered in ED Medications  fentaNYL (SUBLIMAZE) injection 50 mcg (50 mcg Intravenous Given 06/03/18 1855)     Initial Impression / Assessment and Plan / ED Course  I have reviewed the triage vital signs and the nursing notes.  Pertinent labs & imaging results that were available during my care of the patient were reviewed by me and considered in my medical decision making (see chart for details).     Parthenia Vessey is a 84 y.o. female with a past medical history significant for hypertension, hyperlipidemia, atrial fibrillation on Xarelto, CAD, and CKD who presents with left pelvic pain and hip pain after a fall as well as 1 week of cough and shortness of breath.  Patient is coming by family reports that patient was in the bathroom today and had a mechanical fall.  She did not hit her head or neck.  She reports she walked to her bed after the fall bearing weight but then when she was twisting in bed felt sudden onset of left hip and lateral pelvis pain.  She reports the pain is severe, 8 out of 10 severity.  She reports it is a dull pain.  She has not been able to bear weight or walk on it since the fall.  She does report that she has been having some shortness of breath and cough for the last week but has not had any production, fevers, or chills.  She denies chest pain or palpitations.  She denies nausea  vomiting, conservation, diarrhea, or urinary symptoms.  She says that she is seen a pulmonologist and was recently discontinued from oxygen 2 weeks ago.  She reports prior right humerus fracture after a fall several months ago.  She reports that she sees Dr. Aundria Rud with orthopedics for orthopedic management.  Next  On exam, patient has tenderness in her left hip and proximal femur.  Patient had pain with any hip manipulation.  Patient did have normal DP pulse bilaterally and had normal sensation and strength in the ankle.  Patient had no tenderness at the knee.  Patient's abdomen was otherwise nontender and lungs had some mild rhonchi.  No significant wheezing appreciated.  Patient had a mild systolic murmur.  Patient had symmetric upper cavity pulses.  Next  Clinically, I am concerned about a pelvis or hip fracture for the patient.  There is no shortening of the leg however given the tenderness she may have occult fracture if x-rays negative.  She will have x-rays however if it is negative we will likely obtain CT or MRI.  Patient also have chest x-ray as patient was hypoxic on arrival with a sat in the mid 80s.  She was placed on 1 L nasal cannula with improvement in saturation.  With the cough, will obtain chest x-ray.  Next  Anticipate reassessment after work-up.  10:09 PM Chest x-ray did not show pneumonia.  X-ray did not show fracture however given the high concern, CT was obtained.  CT revealed a left greater trochanter fracture of the femur.  Orthopedics was called and they recommended patient will be stable to follow-up with her orthopedist, Dr. Aundria Rud this week.  They recommended pain control.   Patient was reassessed and evaluate to see if she was able to bear weight or stand.  Patient was unable to stand or bear weight and was very unsteady.  Patient nearly fell.  Patient then had her oxygen saturation reassessed and it was in the low 80s.  Given the patient's report that she is not on  oxygen at home, has hypoxia, has a new femur fracture and is unable to walk, do not feel she is safe for discharge home.    Hospitalist team will be called for admission for pain management, respiratory monitoring, and likely PT/OT  Evaluation.   Final Clinical Impressions(s) / ED Diagnoses   Final diagnoses:  Fall, initial encounter  Closed displaced fracture of greater trochanter of left femur, initial encounter (HCC)  Hypoxia    Clinical Impression: 1. Fall, initial encounter   2. Closed displaced fracture of greater trochanter of left femur, initial encounter (HCC)   3. Hypoxia     Disposition: Admit  This note was prepared with assistance of Dragon voice recognition software. Occasional wrong-word or sound-a-like substitutions may have occurred due to the inherent limitations of voice recognition software.     Tegeler, Canary Brim, MD 06/04/18 [phone]

## 2018-06-04 ENCOUNTER — Encounter (HOSPITAL_COMMUNITY): Payer: Self-pay | Admitting: Internal Medicine

## 2018-06-04 ENCOUNTER — Observation Stay (HOSPITAL_COMMUNITY): Payer: Medicare PPO

## 2018-06-04 DIAGNOSIS — S72113A Displaced fracture of greater trochanter of unspecified femur, initial encounter for closed fracture: Secondary | ICD-10-CM | POA: Diagnosis present

## 2018-06-04 DIAGNOSIS — I480 Paroxysmal atrial fibrillation: Secondary | ICD-10-CM | POA: Diagnosis not present

## 2018-06-04 DIAGNOSIS — R0902 Hypoxemia: Secondary | ICD-10-CM | POA: Diagnosis not present

## 2018-06-04 DIAGNOSIS — S72112A Displaced fracture of greater trochanter of left femur, initial encounter for closed fracture: Secondary | ICD-10-CM | POA: Diagnosis not present

## 2018-06-04 LAB — COMPREHENSIVE METABOLIC PANEL
ALBUMIN: 3 g/dL — AB (ref 3.5–5.0)
ALT: 7 U/L — ABNORMAL LOW (ref 14–54)
ANION GAP: 9 (ref 5–15)
AST: 19 U/L (ref 15–41)
Alkaline Phosphatase: 43 U/L (ref 38–126)
BUN: 15 mg/dL (ref 6–20)
CHLORIDE: 107 mmol/L (ref 101–111)
CO2: 25 mmol/L (ref 22–32)
Calcium: 8.5 mg/dL — ABNORMAL LOW (ref 8.9–10.3)
Creatinine, Ser: 0.8 mg/dL (ref 0.44–1.00)
GFR calc Af Amer: >60 mL/min (ref >60)
GLUCOSE: 136 mg/dL — AB (ref 65–99)
POTASSIUM: 3.3 mmol/L — AB (ref 3.5–5.1)
Sodium: 141 mmol/L (ref 135–145)
TOTAL PROTEIN: 5.9 g/dL — AB (ref 6.5–8.1)
Total Bilirubin: 0.8 mg/dL (ref 0.3–1.2)

## 2018-06-04 LAB — CBC
HEMATOCRIT: 34.3 % — AB (ref 36.0–46.0)
HEMOGLOBIN: 11.3 g/dL — AB (ref 12.0–15.0)
MCH: 32.6 pg (ref 26.0–34.0)
MCHC: 32.9 g/dL (ref 30.0–36.0)
MCV: 98.8 fL (ref 78.0–100.0)
Platelets: 189 10*3/uL (ref 150–400)
RBC: 3.47 MIL/uL — ABNORMAL LOW (ref 3.87–5.11)
RDW: 13 % (ref 11.5–15.5)
WBC: 7.1 10*3/uL (ref 4.0–10.5)

## 2018-06-04 MED ORDER — IPRATROPIUM-ALBUTEROL 0.5-2.5 (3) MG/3ML IN SOLN
3.0000 mL | Freq: Four times a day (QID) | RESPIRATORY_TRACT | Status: DC
  Administered 2018-06-04: 3 mL via RESPIRATORY_TRACT
  Filled 2018-06-04: qty 3

## 2018-06-04 MED ORDER — FOLIC ACID 1 MG PO TABS
1.0000 mg | ORAL_TABLET | Freq: Every day | ORAL | Status: DC
  Administered 2018-06-04 – 2018-06-07 (×4): 1 mg via ORAL
  Filled 2018-06-04 (×4): qty 1

## 2018-06-04 MED ORDER — POTASSIUM CHLORIDE CRYS ER 20 MEQ PO TBCR
40.0000 meq | EXTENDED_RELEASE_TABLET | Freq: Every day | ORAL | Status: DC
  Administered 2018-06-04 – 2018-06-07 (×4): 40 meq via ORAL
  Filled 2018-06-04 (×4): qty 2

## 2018-06-04 MED ORDER — LISINOPRIL 20 MG PO TABS
40.0000 mg | ORAL_TABLET | Freq: Every day | ORAL | Status: DC
  Administered 2018-06-04 – 2018-06-07 (×4): 40 mg via ORAL
  Filled 2018-06-04 (×4): qty 2

## 2018-06-04 MED ORDER — BIOTENE DRY MOUTH MT LIQD: 15.0000 mL | OROMUCOSAL | Status: DC | PRN

## 2018-06-04 MED ORDER — IPRATROPIUM-ALBUTEROL 0.5-2.5 (3) MG/3ML IN SOLN
3.0000 mL | Freq: Three times a day (TID) | RESPIRATORY_TRACT | Status: DC
  Administered 2018-06-04 – 2018-06-05 (×2): 3 mL via RESPIRATORY_TRACT
  Filled 2018-06-04 (×2): qty 3

## 2018-06-04 MED ORDER — METOPROLOL TARTRATE 25 MG PO TABS
12.5000 mg | ORAL_TABLET | Freq: Two times a day (BID) | ORAL | Status: DC
  Administered 2018-06-04 – 2018-06-07 (×8): 12.5 mg via ORAL
  Filled 2018-06-04 (×8): qty 1

## 2018-06-04 MED ORDER — SODIUM CHLORIDE 0.9 % IV SOLN
INTRAVENOUS | Status: AC
  Administered 2018-06-04: 03:00:00 via INTRAVENOUS

## 2018-06-04 MED ORDER — AMLODIPINE BESYLATE 5 MG PO TABS
5.0000 mg | ORAL_TABLET | Freq: Every day | ORAL | Status: DC
  Administered 2018-06-04 – 2018-06-07 (×4): 5 mg via ORAL
  Filled 2018-06-04 (×4): qty 1

## 2018-06-04 MED ORDER — LEVOTHYROXINE SODIUM 25 MCG PO TABS
75.0000 ug | ORAL_TABLET | Freq: Every day | ORAL | Status: DC
Start: 2018-06-04 — End: 2018-06-07
  Administered 2018-06-04 – 2018-06-07 (×4): 75 ug via ORAL
  Filled 2018-06-04 (×4): qty 1

## 2018-06-04 MED ORDER — POTASSIUM CHLORIDE CRYS ER 10 MEQ PO TBCR: 10.0000 meq | EXTENDED_RELEASE_TABLET | Freq: Every day | ORAL | Status: DC

## 2018-06-04 MED ORDER — ALBUTEROL SULFATE (2.5 MG/3ML) 0.083% IN NEBU: 2.5000 mg | INHALATION_SOLUTION | RESPIRATORY_TRACT | Status: DC | PRN

## 2018-06-04 MED ORDER — OXYCODONE-ACETAMINOPHEN 5-325 MG PO TABS
1.0000 | ORAL_TABLET | ORAL | Status: DC | PRN
  Administered 2018-06-04 – 2018-06-07 (×5): 1 via ORAL
  Filled 2018-06-04 (×5): qty 1

## 2018-06-04 MED ORDER — POLYVINYL ALCOHOL 1.4 % OP SOLN
1.0000 [drp] | Freq: Every day | OPHTHALMIC | Status: DC
  Administered 2018-06-04 – 2018-06-07 (×4): 1 [drp] via OPHTHALMIC
  Filled 2018-06-04: qty 15

## 2018-06-04 MED ORDER — DOXAZOSIN MESYLATE 4 MG PO TABS
8.0000 mg | ORAL_TABLET | Freq: Every day | ORAL | Status: DC
  Administered 2018-06-04 – 2018-06-07 (×4): 8 mg via ORAL
  Filled 2018-06-04 (×4): qty 2

## 2018-06-04 MED ORDER — ACETAMINOPHEN 325 MG PO TABS: 650.0000 mg | ORAL_TABLET | Freq: Four times a day (QID) | ORAL | Status: DC | PRN

## 2018-06-04 MED ORDER — RIVAROXABAN 15 MG PO TABS
15.0000 mg | ORAL_TABLET | Freq: Every day | ORAL | Status: DC
  Administered 2018-06-04 – 2018-06-06 (×3): 15 mg via ORAL
  Filled 2018-06-04 (×3): qty 1

## 2018-06-04 MED ORDER — ONDANSETRON HCL 4 MG/2ML IJ SOLN: 4.0000 mg | Freq: Four times a day (QID) | INTRAMUSCULAR | Status: DC | PRN

## 2018-06-04 MED ORDER — VITAMIN D3 25 MCG (1000 UNIT) PO TABS
1000.0000 [IU] | ORAL_TABLET | Freq: Every evening | ORAL | Status: DC
  Administered 2018-06-04 – 2018-06-06 (×3): 1000 [IU] via ORAL
  Filled 2018-06-04 (×3): qty 1

## 2018-06-04 MED ORDER — ALBUTEROL SULFATE HFA 108 (90 BASE) MCG/ACT IN AERS: 2.0000 | INHALATION_SPRAY | Freq: Four times a day (QID) | RESPIRATORY_TRACT | Status: DC | PRN

## 2018-06-04 MED ORDER — PANTOPRAZOLE SODIUM 40 MG PO TBEC
40.0000 mg | DELAYED_RELEASE_TABLET | Freq: Every day | ORAL | Status: DC
  Administered 2018-06-04 – 2018-06-07 (×4): 40 mg via ORAL
  Filled 2018-06-04 (×4): qty 1

## 2018-06-04 MED ORDER — SIMVASTATIN 10 MG PO TABS
5.0000 mg | ORAL_TABLET | Freq: Every evening | ORAL | Status: DC
  Administered 2018-06-04 – 2018-06-06 (×3): 5 mg via ORAL
  Filled 2018-06-04 (×3): qty 1

## 2018-06-04 MED ORDER — MIRABEGRON ER 25 MG PO TB24
50.0000 mg | ORAL_TABLET | Freq: Every day | ORAL | Status: DC
  Administered 2018-06-04 – 2018-06-07 (×4): 50 mg via ORAL
  Filled 2018-06-04 (×4): qty 2

## 2018-06-04 MED ORDER — DIPHENHYDRAMINE HCL 50 MG/ML IJ SOLN: 25.0000 mg | Freq: Four times a day (QID) | INTRAMUSCULAR | Status: DC | PRN

## 2018-06-04 MED ORDER — PAROXETINE HCL 20 MG PO TABS
40.0000 mg | ORAL_TABLET | Freq: Every day | ORAL | Status: DC
  Administered 2018-06-04 – 2018-06-07 (×4): 40 mg via ORAL
  Filled 2018-06-04 (×4): qty 2

## 2018-06-04 MED ORDER — ACETAMINOPHEN 650 MG RE SUPP: 650.0000 mg | Freq: Four times a day (QID) | RECTAL | Status: DC | PRN

## 2018-06-04 MED ORDER — PROSIGHT PO TABS
1.0000 | ORAL_TABLET | Freq: Every day | ORAL | Status: DC
  Administered 2018-06-04 – 2018-06-07 (×4): 1 via ORAL
  Filled 2018-06-04 (×4): qty 1

## 2018-06-04 NOTE — Consult Note (Signed)
Reason for Consult left hip pain Referring Physician: Hospitalist service  HPI: Christina Ortiz is an 84 y.o. female with a recent history of a fall sustaining a right humerus fracture.  She had been followed in our office by Dr. Victorino December, M.  D.  And had been progressing well.  She was at home yesterday and had a mechanical fall in her bathroom with complaints of left hip pain and inability to bear weight.  On presentation to the emergency room exam was primarily significant for diffuse tenderness about the left hip with subsequent plain films and CT scan showing a nondisplaced greater trochanter fracture of the left proximal femur.  Initial plan had been for patient to be discharged home, weightbearing as tolerated.  However she developed hypoxia related to her known chronic pulmonary disease and in addition progressed poorly with attempted ambulation and was subsequently admitted for pain management.  Formal orthopedic consultation is requested today.  Patient reports that her right shoulder is actually progressed very well after recent injury.  She has been receiving home health occupational therapy.  Past Medical History:  Diagnosis Date  . Acute bronchitis 10/2017  . Arthritis   . CKD (chronic kidney disease)    Xarelto dose is 15 mg QD  . Coronary artery disease    a. s/p PCI/stenting to RCA and LAD ([facility] [place]) in 2006; b. Nuc 1/15: No ischemia, EF 70%  . Depression   . Gastritis   . History of cholecystectomy   . History of echocardiogram    a. Echo 1/15: Moderate LVH, EF 55-60%, impaired relaxation, mild AS  . Hyperlipidemia   . Hypertension   . Hypokalemia   . Mobitz type 1 second degree atrioventricular block    a. Event Monitor 3/15: NSR, first-degree AV block, second-degree AB block - Mobitz 1, PACs, NSVT (5 beats)  . PAF (paroxysmal atrial fibrillation) ([place])   . Thyroid disease     Past Surgical History:  Procedure Laterality Date  . BREAST LUMPECTOMY  Left   . CARDIAC CATHETERIZATION    . CATARACT EXTRACTION Bilateral   . CHOLECYSTECTOMY    . DILATION AND CURETTAGE OF UTERUS    . EYE SURGERY      Family History  Problem Relation Age of Onset  . Heart disease Mother   . Heart attack Mother   . Lung cancer Father   . Cancer Father        lung    Social History:  reports that she quit smoking about 2 years ago. Her smoking use included cigarettes. She has a 33.50 pack-year smoking history. She has never used smokeless tobacco. She reports that she drinks about 6.0 oz of alcohol per week. She reports that she does not use drugs.  Allergies:  Allergies  Allergen Reactions  . Tape Other (See Comments)    Patient's skin is thin and will TEAR AND BRUISE EASILY    Medications: I have reviewed the patient's current medications.  Results for orders placed or performed during the hospital encounter of 06/03/18 (from the past 48 hour(s))  CBC with Differential     Status: Abnormal   Collection Time: 06/03/18  6:51 PM  Result Value Ref Range   WBC 9.4 4.0 - 10.5 K/uL   RBC 3.65 (L) 3.87 - 5.11 MIL/uL   Hemoglobin 11.9 (L) 12.0 - 15.0 g/dL   HCT 36.4 36.0 - 46.0 %   MCV 99.7 78.0 - 100.0 fL   MCH 32.6  26.0 - 34.0 pg   MCHC 32.7 30.0 - 36.0 g/dL   RDW 12.6 11.5 - 15.5 %   Platelets 188 150 - 400 K/uL   Neutrophils Relative % 71 %   Neutro Abs 6.7 1.7 - 7.7 K/uL   Lymphocytes Relative 15 %   Lymphs Abs 1.4 0.7 - 4.0 K/uL   Monocytes Relative 11 %   Monocytes Absolute 1.0 0.1 - 1.0 K/uL   Eosinophils Relative 3 %   Eosinophils Absolute 0.3 0.0 - 0.7 K/uL   Basophils Relative 0 %   Basophils Absolute 0.0 0.0 - 0.1 K/uL    Comment: Performed at [facility], [place] [address] [place], [place] [address]  Comprehensive metabolic panel     Status: Abnormal   Collection Time: 06/03/18  6:51 PM  Result Value Ref Range   Sodium 142 135 - 145 mmol/L   Potassium 3.5 3.5 - 5.1 mmol/L   Chloride 105 101 - 111 mmol/L    CO2 26 22 - 32 mmol/L   Glucose, Bld 96 65 - 99 mg/dL   BUN 18 6 - 20 mg/dL   Creatinine, Ser 0.76 0.44 - 1.00 mg/dL   Calcium 8.8 (L) 8.9 - 10.3 mg/dL   Total Protein 6.6 6.5 - 8.1 g/dL   Albumin 3.4 (L) 3.5 - 5.0 g/dL   AST 19 15 - 41 U/L   ALT 8 (L) 14 - 54 U/L   Alkaline Phosphatase 51 38 - 126 U/L   Total Bilirubin 0.9 0.3 - 1.2 mg/dL   GFR calc non Af Amer >60 >60 mL/min   GFR calc Af Amer >60 >60 mL/min    Comment: (NOTE) The eGFR has been calculated using the CKD EPI equation. This calculation has not been validated in all clinical situations. eGFR's persistently <60 mL/min signify possible Chronic Kidney Disease.    Anion gap 11 5 - 15    Comment: Performed at [facility], [place] [address] [place], [place] [address]  Comprehensive metabolic panel     Status: Abnormal   Collection Time: 06/04/18  3:50 AM  Result Value Ref Range   Sodium 141 135 - 145 mmol/L   Potassium 3.3 (L) 3.5 - 5.1 mmol/L   Chloride 107 101 - 111 mmol/L   CO2 25 22 - 32 mmol/L   Glucose, Bld 136 (H) 65 - 99 mg/dL   BUN 15 6 - 20 mg/dL   Creatinine, Ser 0.80 0.44 - 1.00 mg/dL   Calcium 8.5 (L) 8.9 - 10.3 mg/dL   Total Protein 5.9 (L) 6.5 - 8.1 g/dL   Albumin 3.0 (L) 3.5 - 5.0 g/dL   AST 19 15 - 41 U/L   ALT 7 (L) 14 - 54 U/L   Alkaline Phosphatase 43 38 - 126 U/L   Total Bilirubin 0.8 0.3 - 1.2 mg/dL   GFR calc non Af Amer >60 >60 mL/min   GFR calc Af Amer >60 >60 mL/min    Comment: (NOTE) The eGFR has been calculated using the CKD EPI equation. This calculation has not been validated in all clinical situations. eGFR's persistently <60 mL/min signify possible Chronic Kidney Disease.    Anion gap 9 5 - 15    Comment: Performed at [facility], [place] [address], [place], [place] [address]  CBC     Status: Abnormal   Collection Time: 06/04/18  3:50 AM  Result Value Ref Range   WBC 7.1 4.0 - 10.5 K/uL  RBC 3.47 (L) 3.87 - 5.11 MIL/uL   Hemoglobin 11.3  (L) 12.0 - 15.0 g/dL   HCT 34.3 (L) 36.0 - 46.0 %   MCV 98.8 78.0 - 100.0 fL   MCH 32.6 26.0 - 34.0 pg   MCHC 32.9 30.0 - 36.0 g/dL   RDW 13.0 11.5 - 15.5 %   Platelets 189 150 - 400 K/uL    Comment: Performed at [facility], [place] [address] [place], [place] [address]    Dg Chest 2 View  Result Date: 06/03/2018 CLINICAL DATA:  Cough and hypoxia.  Recent fall. EXAM: CHEST - 2 VIEW COMPARISON:  04/27/2018 FINDINGS: Marked cardiac enlargement remains stable. Aortic atherosclerosis. Large hiatal hernia is again seen. No evidence of pulmonary infiltrate or edema. No evidence of pneumothorax or pleural effusion. IMPRESSION: Stable marked cardiac enlargement and large hiatal hernia. No active lung disease. Electronically Signed   By: Earle Gell M.D.   On: 06/03/2018 18:46   Ct Pelvis Wo Contrast  Result Date: 06/03/2018 CLINICAL DATA:  Lost balance in bathroom and fell with resulting left hip/pelvic pain. EXAM: CT PELVIS WITHOUT CONTRAST TECHNIQUE: Multidetector CT imaging of the pelvis was performed following the standard protocol without intravenous contrast. COMPARISON:  Plain films today. FINDINGS: Urinary Tract: Partially visualize exophytic cyst over the lower pole left kidney. Visualized ureters and bladder are normal. Bowel: Minimal diverticulosis of the colon. Appendix not visualized. Visualize small bowel within normal. Vascular/Lymphatic: Moderate calcified plaque over the abdominal aorta and iliac arteries. No adenopathy. Reproductive:  Within normal. Other:  No free fluid or focal inflammatory change. Musculoskeletal: Subtle minimally displaced fracture of the greater trochanter of the left proximal femur. Minimal degenerative change of the hips bilaterally. Degenerate change of the spine with disc disease the L4-5 and L5-S1 levels. Suggestion of subtle grade 1 anterolisthesis of L4 on L5. Severe bilateral neural foraminal narrowing at the L4-5 and L5-S1 levels. IMPRESSION:  Very minimally displaced fracture of the greater trochanter of the left proximal femur. Additional chronic findings as described. Electronically Signed   By: Marin Olp M.D.   On: 06/03/2018 20:18   Dg Hip Unilat W Or Wo Pelvis 2-3 Views Left  Result Date: 06/03/2018 CLINICAL DATA:  Fall in bathroom today. Left hip pain. Initial encounter. EXAM: DG HIP (WITH OR WITHOUT PELVIS) 2-3V LEFT COMPARISON:  None. FINDINGS: There is no evidence of hip fracture or dislocation. There is no evidence of arthropathy or other focal bone abnormality. IMPRESSION: Negative. Electronically Signed   By: Earle Gell M.D.   On: 06/03/2018 18:47   Dg Femur Min 2 Views Left  Result Date: 06/03/2018 CLINICAL DATA:  Fall in bathroom today. Left femur pain. Initial encounter. EXAM: LEFT FEMUR 2 VIEWS COMPARISON:  None. FINDINGS: There is no evidence of fracture or other focal bone lesions. Generalized osteopenia noted. Mild peripheral vascular calcification also seen. IMPRESSION: No acute findings. Electronically Signed   By: Earle Gell M.D.   On: 06/03/2018 18:47     Vitals Temp:  [97.7 F (36.5 C)-98.5 F (36.9 C)] 98.5 F (36.9 C) (06/09 0600) Pulse Rate:  [52-95] 52 (06/09 0600) Resp:  [16-23] 18 (06/09 0600) BP: (106-145)/(59-86) 123/77 (06/09 0600) SpO2:  [87 %-98 %] 98 % (06/09 0600) Weight:  [68.4 kg (150 lb 12.7 oz)-68.5 kg (151 lb)] 68.4 kg (150 lb 12.7 oz) (06/09 0600) Body mass index is 26.71 kg/m.  Physical Exam: Patient is a pleasant, cooperative, elderly female who is alert and oriented.  She is moderately hard of hearing.  Nasal O2 cannula is in place.  Palpation about the shoulder girdles demonstrates no clavicular crepitance.  She has good grip strength bilaterally.  Right shoulder demonstrates full active elevation achieving overhead elevation to 150 degrees.  She has no complaints of pain with shoulder elbow and digital motion bilaterally.  She has no complaints of pain with palpation or  manipulation of the right lower extremity.  She is neurovascularly intact in both lower extremities.  Palpation of the left knee and left ankle reveals no tenderness.  Thigh compartments are soft.  She has moderate tenderness to palpation of the left hip with discomfort on passive left hip motion but there is no crepitus or gross instability.  The skin is intact.  Radiographs  Review of her recent left hip plain films as well as CT scan show a nondisplaced fracture of the greater trochanter.     Assessment/Plan: Impression: Nondisplaced left hip greater trochanter fracture, recent history of right proximal humerus fracture which clinically demonstrates healing with good motion and strength on today's exam, chronic pulmonary disease with recent exacerbation Treatment: I have counseled the patient and her daughter regarding today's findings.  Regarding her left hip greater trochanter fracture which is nondisplaced, this is a stable injury.  She may be weightbearing as tolerated with a walker.  I would recommend that she follow-up with Dr. Stann Mainland who she had actually been scheduled to see this coming week.  I would asked that she have this visit rescheduled for 2 to 3 weeks such that Dr. Stann Mainland can follow-up with her for both her hip and shoulder situation.  During the interim therapy may work with the patient for transfers and gait training, weightbearing as tolerated left lower extremity and ad lib use RUE  Please contact us with any additional questions or concerns.  Jermeka Schlotterbeck M Tysen Roesler 06/04/2018, 1:45 PM  Contact # [phone]

## 2018-06-04 NOTE — Progress Notes (Signed)
PT Cancellation Note  Patient Details Name: Christina Ortiz MRN: [id number] DOB: Jul 30, 1929   Cancelled Treatment:    Reason Eval/Treat Not Completed: Patient not medically ready. Awaiting ortho consult. Will await their recommendation and WB status.   Enzo Montgomery 06/04/2018, 9:07 AM

## 2018-06-04 NOTE — Progress Notes (Signed)
Patient was admitted early this a.m. after midnight and H&P has been reviewed and I am in current agreement with assessment and plan done by Dr. Pearson Grippe.  Additional changes with plan of care been made accordingly.  Patient is a very pleasant 84 year old female with a past medical history significant for hypertension, Hyperlipidemia, CKD stage III, Paroxysmal Atrial Fibrillation, coronary artery disease, history of gastritis, history of depression, history of hypothyroidism, and other comorbidities who presented to [facility] emergency room after a chief complaint of a fall.  CT scan of the pelvis showed a very minimally displaced fracture of the greater trochanter of the left proximal femur and the on-call orthopedic recommended that she be discharged home and follow-up with her primary orthopedic surgeon Dr. Aundria Rud in outpatient setting.  However prior to discharge patient was unable to bear weight and stand and almost fell again and was noted to be hypoxic.  Is unclear why the patient was hypoxic but it may been felt secondary to fentanyl administration so she was admitted for further evaluation and work-up.  I discussed the case with Dr. Rennis Chris of Orthopedics today who states that he will review the case and make recommendations as a physical therapy canceled their note due to no weightbearing recommendations and no evaluation by the Orthopedic Surgeon.  Have added duo nebs, flutter valve, incentive spirometer and repeated chest x-ray for patient's hypoxia.  Will wean patient's oxygen as necessary and continue pain control for patient's closed minimally displaced fracture of the greater trochanter of the left femur and appreciate any further orthopedic recommendations.  We will continue to monitor patient clinical response to intervention and repeat blood work in the a.m.

## 2018-06-04 NOTE — ED Notes (Signed)
ED TO INPATIENT HANDOFF REPORT  Name/Age/Gender Christina Ortiz 84 y.o. female  Code Status Code Status History    Date Active Date Inactive Code Status Order ID Comments User Context   03/31/2018 0328 04/04/2018 1543 Full Code [id number]  Ivor Costa, MD ED   11/09/2017 1615 11/14/2017 2050 Full Code [id number]  Samella Parr, NP ED   01/22/2017 1412 01/25/2017 1724 Full Code [id number]  Rondel Jumbo, PA-C ED    Advance Directive Documentation     Most Recent Value  Type of Advance Directive  Healthcare Power of [place], Living will  Pre-existing out of facility DNR order (yellow form or pink MOST form)  -  "MOST" Form in Place?  -      Home/SNF/Other Home  Chief Complaint Lt. hip pain  Level of Care/Admitting Diagnosis ED Disposition    ED Disposition Condition [place]: [facility] [[id number]]  Level of Care: Telemetry [5]  Admit to tele based on following criteria: Monitor for Ischemic changes  Diagnosis: Hypoxia [300808]  Admitting Physician: Jani Gravel [[id number]]  Attending Physician: Jani Gravel [[id number]]  PT Class (Do Not Modify): Observation [104]  PT Acc Code (Do Not Modify): Observation [10022]       Medical History Past Medical History:  Diagnosis Date  . Acute bronchitis 10/2017  . Arthritis   . CKD (chronic kidney disease)    Xarelto dose is 15 mg QD  . Coronary artery disease    a. s/p PCI/stenting to RCA and LAD ([facility] in [place]) in 2006; b. Nuc 1/15: No ischemia, EF 70%  . Depression   . Gastritis   . History of cholecystectomy   . History of echocardiogram    a. Echo 1/15: Moderate LVH, EF 55-60%, impaired relaxation, mild AS  . Hyperlipidemia   . Hypertension   . Hypokalemia   . Mobitz type 1 second degree atrioventricular block    a. Event Monitor 3/15: NSR, first-degree AV block, second-degree AB block - Mobitz 1, PACs, NSVT (5 beats)  . PAF (paroxysmal atrial fibrillation) ([place])   . Thyroid  disease     Allergies Allergies  Allergen Reactions  . Tape Other (See Comments)    Patient's skin is thin and will TEAR AND BRUISE EASILY    IV Location/Drains/Wounds Patient Lines/Drains/Airways Status   Active Line/Drains/Airways    Name:   Placement date:   Placement time:   Site:   Days:   External Urinary Catheter   04/02/18    0330    -   28          Labs/Imaging Results for orders placed or performed during the hospital encounter of 06/03/18 (from the past 48 hour(s))  CBC with Differential     Status: Abnormal   Collection Time: 06/03/18  6:51 PM  Result Value Ref Range   WBC 9.4 4.0 - 10.5 K/uL   RBC 3.65 (L) 3.87 - 5.11 MIL/uL   Hemoglobin 11.9 (L) 12.0 - 15.0 g/dL   HCT 36.4 36.0 - 46.0 %   MCV 99.7 78.0 - 100.0 fL   MCH 32.6 26.0 - 34.0 pg   MCHC 32.7 30.0 - 36.0 g/dL   RDW 12.6 11.5 - 15.5 %   Platelets 188 150 - 400 K/uL   Neutrophils Relative % 71 %   Neutro Abs 6.7 1.7 - 7.7 K/uL   Lymphocytes Relative 15 %   Lymphs Abs 1.4 0.7 - 4.0 K/uL  Monocytes Relative 11 %   Monocytes Absolute 1.0 0.1 - 1.0 K/uL   Eosinophils Relative 3 %   Eosinophils Absolute 0.3 0.0 - 0.7 K/uL   Basophils Relative 0 %   Basophils Absolute 0.0 0.0 - 0.1 K/uL    Comment: Performed at [facility], [place] [address], [place], [place] [address]  Comprehensive metabolic panel     Status: Abnormal   Collection Time: 06/03/18  6:51 PM  Result Value Ref Range   Sodium 142 135 - 145 mmol/L   Potassium 3.5 3.5 - 5.1 mmol/L   Chloride 105 101 - 111 mmol/L   CO2 26 22 - 32 mmol/L   Glucose, Bld 96 65 - 99 mg/dL   BUN 18 6 - 20 mg/dL   Creatinine, Ser 0.76 0.44 - 1.00 mg/dL   Calcium 8.8 (L) 8.9 - 10.3 mg/dL   Total Protein 6.6 6.5 - 8.1 g/dL   Albumin 3.4 (L) 3.5 - 5.0 g/dL   AST 19 15 - 41 U/L   ALT 8 (L) 14 - 54 U/L   Alkaline Phosphatase 51 38 - 126 U/L   Total Bilirubin 0.9 0.3 - 1.2 mg/dL   GFR calc non Af Amer >60 >60 mL/min   GFR calc Af Amer >60  >60 mL/min    Comment: (NOTE) The eGFR has been calculated using the CKD EPI equation. This calculation has not been validated in all clinical situations. eGFR's persistently <60 mL/min signify possible Chronic Kidney Disease.    Anion gap 11 5 - 15    Comment: Performed at [facility], [place] [address] [place], [place] [address]   Dg Chest 2 View  Result Date: 06/03/2018 CLINICAL DATA:  Cough and hypoxia.  Recent fall. EXAM: CHEST - 2 VIEW COMPARISON:  04/27/2018 FINDINGS: Marked cardiac enlargement remains stable. Aortic atherosclerosis. Large hiatal hernia is again seen. No evidence of pulmonary infiltrate or edema. No evidence of pneumothorax or pleural effusion. IMPRESSION: Stable marked cardiac enlargement and large hiatal hernia. No active lung disease. Electronically Signed   By: Earle Gell M.D.   On: 06/03/2018 18:46   Ct Pelvis Wo Contrast  Result Date: 06/03/2018 CLINICAL DATA:  Lost balance in bathroom and fell with resulting left hip/pelvic pain. EXAM: CT PELVIS WITHOUT CONTRAST TECHNIQUE: Multidetector CT imaging of the pelvis was performed following the standard protocol without intravenous contrast. COMPARISON:  Plain films today. FINDINGS: Urinary Tract: Partially visualize exophytic cyst over the lower pole left kidney. Visualized ureters and bladder are normal. Bowel: Minimal diverticulosis of the colon. Appendix not visualized. Visualize small bowel within normal. Vascular/Lymphatic: Moderate calcified plaque over the abdominal aorta and iliac arteries. No adenopathy. Reproductive:  Within normal. Other:  No free fluid or focal inflammatory change. Musculoskeletal: Subtle minimally displaced fracture of the greater trochanter of the left proximal femur. Minimal degenerative change of the hips bilaterally. Degenerate change of the spine with disc disease the L4-5 and L5-S1 levels. Suggestion of subtle grade 1 anterolisthesis of L4 on L5. Severe bilateral neural  foraminal narrowing at the L4-5 and L5-S1 levels. IMPRESSION: Very minimally displaced fracture of the greater trochanter of the left proximal femur. Additional chronic findings as described. Electronically Signed   By: Marin Olp M.D.   On: 06/03/2018 20:18   Dg Hip Unilat W Or Wo Pelvis 2-3 Views Left  Result Date: 06/03/2018 CLINICAL DATA:  Fall in bathroom today. Left hip pain. Initial encounter. EXAM: DG HIP (WITH OR WITHOUT PELVIS) 2-3V LEFT  COMPARISON:  None. FINDINGS: There is no evidence of hip fracture or dislocation. There is no evidence of arthropathy or other focal bone abnormality. IMPRESSION: Negative. Electronically Signed   By: Earle Gell M.D.   On: 06/03/2018 18:47   Dg Femur Min 2 Views Left  Result Date: 06/03/2018 CLINICAL DATA:  Fall in bathroom today. Left femur pain. Initial encounter. EXAM: LEFT FEMUR 2 VIEWS COMPARISON:  None. FINDINGS: There is no evidence of fracture or other focal bone lesions. Generalized osteopenia noted. Mild peripheral vascular calcification also seen. IMPRESSION: No acute findings. Electronically Signed   By: Earle Gell M.D.   On: 06/03/2018 18:47    Pending Labs Unresulted Labs (From admission, onward)   None      Vitals/Pain Today's Vitals   06/03/18 1931 06/03/18 2207 06/03/18 2208 06/03/18 2310  BP:  125/85 125/85 120/83  Pulse:  71 73 69  Resp:  (!) 21 16 (!) 23  Temp:      TempSrc:      SpO2:  94% 96% 96%  Weight:      Height:      PainSc: 0-No pain 0-No pain      Isolation Precautions No active isolations  Medications Medications  fentaNYL (SUBLIMAZE) injection 50 mcg (50 mcg Intravenous Given 06/03/18 1855)    Mobility walks with person assist

## 2018-06-04 NOTE — Evaluation (Signed)
Physical Therapy Evaluation Patient Details Name: Christina Ortiz MRN: [id number] DOB: January 31, 1929 Today's Date: 06/04/2018   History of Present Illness  Christina Ortiz is an 84 y.o. female with a recent history of a fall sustaining a right humerus fracture.  She had been followed in our office by Dr. Duwayne Heck, M.  D.  And had been progressing well.  She was at home yesterday and had a mechanical fall in her bathroom with complaints of left hip pain and inability to bear weight.  On presentation to the emergency room exam was primarily significant for diffuse tenderness about the left hip with subsequent plain films and CT scan showing a nondisplaced greater trochanter fracture of the left proximal femur. She also had hypoxia while in ED. PMH: bronchitis, PAF, HTN, depression, CAD s/p PCI/stent, CKD, heart block (Mobitz type 1)  Clinical Impression  Pt admitted with above diagnosis. Pt currently with functional limitations due to the deficits listed below (see PT Problem List). Pt will benefit from skilled PT to increase their independence and safety with mobility to allow discharge to the venue listed below.  Recommend short term SNF rehab due to patient having multiple falls with fx in last 2 months and de-sat with ambulation on room air.  Pt and daughter's goal is for patient to remain in her home as long as possible, and feel that short term rehab would allow her to get stronger and be more independent when she returns to her home.     Follow Up Recommendations SNF    Equipment Recommendations  None recommended by PT    Recommendations for Other Services       Precautions / Restrictions Precautions Precautions: Fall Restrictions Weight Bearing Restrictions: Yes RUE Weight Bearing: Weight bearing as tolerated LLE Weight Bearing: Weight bearing as tolerated      Mobility  Bed Mobility Overal bed mobility: Needs Assistance Bed Mobility: Supine to Sit     Supine to sit: Min  guard     General bed mobility comments: use of rail with bed slightly elevated  Transfers Overall transfer level: Needs assistance Equipment used: Rolling walker (2 wheeled) Transfers: Sit to/from Stand Sit to Stand: Min assist;Mod assist         General transfer comment: cues to scoot out all the way to EOB prior to standing. Cues for hand placement  Ambulation/Gait Ambulation/Gait assistance: Min assist Ambulation Distance (Feet): 15 Feet Assistive device: Rolling walker (2 wheeled) Gait Pattern/deviations: Antalgic;Decreased stance time - left Gait velocity: decreased   General Gait Details: Pt ambulated short distance in room on RA and o2 dropped to 78%.  Re-applied o2 at 2 L/min and it returned to 90's quickly. Pt needed cues for proper gait pattern and technique due to L hip pain.  No complaints of R UE pain.  Stairs            Wheelchair Mobility    Modified Rankin (Stroke Patients Only)       Balance Overall balance assessment: History of Falls;Needs assistance   Sitting balance-Leahy Scale: Fair       Standing balance-Leahy Scale: Poor Standing balance comment: requires UE support                             Pertinent Vitals/Pain Pain Assessment: Faces Faces Pain Scale: Hurts even more Pain Location: L hip with gait Pain Descriptors / Indicators: Grimacing Pain Intervention(s): Limited activity within patient's tolerance;Monitored during session;Premedicated  before session;Repositioned    Home Living Family/patient expects to be discharged to:: Private residence Living Arrangements: Alone   Type of Home: Other(Comment)(condo) Home Access: Level entry     Home Layout: One level Home Equipment: Walker - 2 wheels;Cane - single point;Wheelchair - [employer];Shower seat - built in;Hand held shower head;Grab bars - tub/shower;Transport chair;Shower seat      Prior Function Level of Independence: Independent                Hand Dominance        Extremity/Trunk Assessment   Upper Extremity Assessment Upper Extremity Assessment: RUE deficits/detail RUE Deficits / Details: recent humerus fx    Lower Extremity Assessment Lower Extremity Assessment: LLE deficits/detail LLE Deficits / Details: limited ROM due to pain LLE: Unable to fully assess due to pain       Communication   Communication: HOH  Cognition Arousal/Alertness: Awake/alert Behavior During Therapy: WFL for tasks assessed/performed Overall Cognitive Status: Within Functional Limits for tasks assessed                                        General Comments      Exercises     Assessment/Plan    PT Assessment Patient needs continued PT services  PT Problem List Decreased strength;Decreased activity tolerance;Decreased balance;Decreased mobility;Decreased safety awareness;Pain       PT Treatment Interventions DME instruction;Gait training;Functional mobility training;Therapeutic exercise;Therapeutic activities;Balance training;Patient/family education    PT Goals (Current goals can be found in the Care Plan section)  Acute Rehab PT Goals Patient Stated Goal: stay in her home as long as possible PT Goal Formulation: With patient/family Time For Goal Achievement: 06/18/18 Potential to Achieve Goals: Good    Frequency Min 3X/week   Barriers to discharge Decreased caregiver support      Co-evaluation               AM-PAC PT "6 Clicks" Daily Activity  Outcome Measure Difficulty turning over in bed (including adjusting bedclothes, sheets and blankets)?: A Little Difficulty moving from lying on back to sitting on the side of the bed? : A Little Difficulty sitting down on and standing up from a chair with arms (e.g., wheelchair, bedside commode, etc,.)?: Unable Help needed moving to and from a bed to chair (including a wheelchair)?: A Little Help needed walking in hospital room?: A Little Help  needed climbing 3-5 steps with a railing? : A Lot 6 Click Score: 15    End of Session Equipment Utilized During Treatment: Gait belt;Oxygen Activity Tolerance: Patient limited by pain;Patient limited by fatigue Patient left: in chair;with call bell/phone within reach;with chair alarm set;with nursing/sitter in room;with family/visitor present Nurse Communication: Mobility status;Weight bearing status PT Visit Diagnosis: Unsteadiness on feet (R26.81);History of falling (Z91.81);Difficulty in walking, not elsewhere classified (R26.2);Pain Pain - Right/Left: Left Pain - part of body: Hip    Time: 1458-1535 PT Time Calculation (min) (ACUTE ONLY): 37 min   Charges:   PT Evaluation $PT Eval Moderate Complexity: 1 Mod PT Treatments $Gait Training: 8-22 mins   PT G Codes:        Alease Fait L. Katrinka Blazing, Jessie Pager [phone] 06/04/2018   Enzo Montgomery 06/04/2018, 3:58 PM

## 2018-06-04 NOTE — H&P (Signed)
TRH H&P   Patient Demographics:    Christina Ortiz, is a 84 y.o. female  MRN: [id number]   DOB - 08-22-1929  Admit Date - 06/03/2018  Outpatient Primary MD for the patient is Danford, Jinny Blossom, NP  Referring MD/NP/PA:  Neena Rhymes  Outpatient Specialists:    Patient coming from: home  Chief Complaint  Patient presents with  . Fall      HPI:    Christina Ortiz  is a 84 y.o. female, hypertension, hyperlipidemia, Ckd stage3, Pafib, CAD , apparently presents with fall in bathroom.  Pt c/o left hip pain and brought to ED for evaluation.   In Ed,  CXR  IMPRESSION: Stable marked cardiac enlargement and large hiatal hernia. No active lung disease.  CT pelvis IMPRESSION: Very minimally displaced fracture of the greater trochanter of the left proximal femur.  Wbc 9.4, Hgb 11.9, Plt 188 Na 142, K 3.5, Bun 18, Creatinine 0.76 Ast 19, Alt 8  Pt noted to be hypoxic, while in ED,   ? Due to Fentanyl  Prior lung function testing negative for Copd  ED was going to send patient home w outpatient f/u with Dr. Aundria Rud however pt noted to be hypoxic and ED requested admission.       Review of systems:    In addition to the HPI above,  No Fever-chills, No Headache, No changes with Vision or hearing, No problems swallowing food or Liquids, No Chest pain, Cough or Shortness of Breath, No Abdominal pain, No Nausea or Vommitting, Bowel movements are regular, No Blood in stool or Urine, No dysuria, No new skin rashes or bruises,  No new weakness, tingling, numbness in any extremity, No recent weight gain or loss, No polyuria, polydypsia or polyphagia, No significant Mental Stressors.  A full 10 point Review of Systems was done, except as stated above, all other Review of Systems were negative.   With Past History of the following :    Past Medical History:  Diagnosis  Date  . Acute bronchitis 10/2017  . Arthritis   . CKD (chronic kidney disease)    Xarelto dose is 15 mg QD  . Coronary artery disease    a. s/p PCI/stenting to RCA and LAD ([facility] in [place]) in 2006; b. Nuc 1/15: No ischemia, EF 70%  . Depression   . Gastritis   . History of cholecystectomy   . History of echocardiogram    a. Echo 1/15: Moderate LVH, EF 55-60%, impaired relaxation, mild AS  . Hyperlipidemia   . Hypertension   . Hypokalemia   . Mobitz type 1 second degree atrioventricular block    a. Event Monitor 3/15: NSR, first-degree AV block, second-degree AB block - Mobitz 1, PACs, NSVT (5 beats)  . PAF (paroxysmal atrial fibrillation) (HCC)   . Thyroid disease       Past Surgical History:  Procedure  Laterality Date  . BREAST LUMPECTOMY Left   . CARDIAC CATHETERIZATION    . CATARACT EXTRACTION Bilateral   . CHOLECYSTECTOMY    . DILATION AND CURETTAGE OF UTERUS    . EYE SURGERY        Social History:     Social History   Tobacco Use  . Smoking status: Former Smoker    Packs/day: 0.50    Years: 67.00    Pack years: 33.50    Types: Cigarettes    Last attempt to quit: 10/25/2015    Years since quitting: 2.6  . Smokeless tobacco: Never Used  . Tobacco comment: quit 2015  Substance Use Topics  . Alcohol use: Yes    Alcohol/week: 6.0 oz    Types: 10 Shots of liquor per week     Lives -  Alone at home Mobility - walks by self typically   Family History :     Family History  Problem Relation Age of Onset  . Heart disease Mother   . Heart attack Mother   . Lung cancer Father   . Cancer Father        lung      Home Medications:   Prior to Admission medications   Medication Sig Start Date End Date Taking? Authorizing Provider  albuterol (PROVENTIL HFA;VENTOLIN HFA) 108 (90 Base) MCG/ACT inhaler Inhale 2 puffs into the lungs every 6 (six) hours as needed for wheezing or shortness of breath. 05/05/18  Yes Mannam, Praveen, MD  amLODipine  (NORVASC) 5 MG tablet Take 1 tablet (5 mg total) by mouth daily. 04/27/18  Yes Danford, Orpha Bur D, NP  antiseptic oral rinse (BIOTENE) LIQD 15 mLs by Mouth Rinse route as needed for dry mouth.   Yes [provider]  cholecalciferol (VITAMIN D) 1000 units tablet Take 1,000 Units by mouth every evening.   Yes [provider]  doxazosin (CARDURA) 8 MG tablet TAKE 1 TABLET (8 MG TOTAL) BY MOUTH DAILY. 02/13/18  Yes Danford, Orpha Bur D, NP  folic acid (FOLVITE) 1 MG tablet Take 1 tablet (1 mg total) by mouth daily. 04/05/18  Yes Regalado, Belkys A, MD  levothyroxine (SYNTHROID, LEVOTHROID) 75 MCG tablet Take 1 tablet (75 mcg total) by mouth daily before breakfast. 01/25/18  Yes Danford, Orpha Bur D, NP  metoprolol tartrate (LOPRESSOR) 25 MG tablet Take 0.5 tablets (12.5 mg total) by mouth 2 (two) times daily. 04/27/18  Yes Danford, Orpha Bur D, NP  mirabegron ER (MYRBETRIQ) 50 MG TB24 tablet Take 50 mg by mouth daily.   Yes [provider]  Multiple Vitamins-Minerals (PRESERVISION AREDS 2 PO) Take 1 tablet by mouth daily.   Yes [provider]  omeprazole (PRILOSEC) 20 MG capsule Take 1 capsule (20 mg total) by mouth daily as needed (heartburn). 11/16/17  Yes Danford, [name], NP  PARoxetine (PAXIL) 40 MG tablet TAKE 1 TABLET BY MOUTH DAILY 04/18/18  Yes Danford, [name], NP  potassium chloride (K-DUR) 10 MEQ tablet TAKE 1 TABLET BY MOUTH DAILY. 04/18/18  Yes Danford, Orpha Bur D, NP  Propylene Glycol (SYSTANE BALANCE) 0.6 % SOLN Place 1 drop into both eyes daily.   Yes [provider]  quinapril (ACCUPRIL) 40 MG tablet TAKE 1 TABLET (40 MG TOTAL) BY MOUTH DAILY. 02/13/18  Yes Danford, Orpha Bur D, NP  simvastatin (ZOCOR) 5 MG tablet TAKE 1 TABLET BY MOUTH EVERY EVENING. 05/08/18  Yes Danford, [name], NP  XARELTO 15 MG TABS tablet TAKE 1 TABLET (15 MG TOTAL) BY MOUTH DAILY  WITH SUPPER. 11/08/17  Yes Jake Bathe, MD  oxyCODONE-acetaminophen (PERCOCET/ROXICET) 5-325 MG tablet Take 1 tablet by mouth  every 4 (four) hours as needed for moderate pain. Patient not taking: Reported on 06/04/2018 04/04/18   Regalado, Jon Billings A, MD  polyethylene glycol (MIRALAX / GLYCOLAX) packet Take 17 g by mouth daily. Patient not taking: Reported on 06/04/2018 04/05/18   Regalado, Jon Billings A, MD  simvastatin (ZOCOR) 5 MG tablet Take 1 tablet (5 mg total) by mouth every evening. Patient not taking: Reported on 06/04/2018 09/06/17   William Hamburger D, NP  thiamine 100 MG tablet Take 1 tablet (100 mg total) by mouth daily. Patient not taking: Reported on 04/27/2018 04/05/18   Alba Cory, MD     Allergies:     Allergies  Allergen Reactions  . Tape Other (See Comments)    Patient's skin is thin and will TEAR AND BRUISE EASILY     Physical Exam:   Vitals  Blood pressure 139/66, pulse 77, temperature 97.9 F (36.6 C), temperature source Oral, resp. rate 20, height 5\' 3"  (1.6 m), weight 68.5 kg (151 lb), SpO2 92 %.   1. General  lying in bed in NAD,    2. Normal affect and insight, Not Suicidal or Homicidal, Awake Alert, Oriented X 3.  3. No F.N deficits, ALL C.Nerves Intact, Strength 5/5 all 4 extremities, Sensation intact all 4 extremities, Plantars down going.  4. Ears and Eyes appear Normal, Conjunctivae clear, PERRLA. Moist Oral Mucosa.  5. Supple Neck, No JVD, No cervical lymphadenopathy appriciated, No Carotid Bruits.  6. Symmetrical Chest wall movement, Good air movement bilaterally, CTAB.  7. RRR, No Gallops, Rubs or Murmurs, No Parasternal Heave.  8. Positive Bowel Sounds, Abdomen Soft, No tenderness, No organomegaly appriciated,No rebound -guarding or rigidity.  9.  No Cyanosis, Normal Skin Turgor, No Skin Rash or Bruise.  10. Good muscle tone,  joints appear normal , no effusions, Normal ROM.  11. No Palpable Lymph Nodes in Neck or Axillae    Data Review:    CBC Recent Labs  Lab 06/03/18 1851  WBC 9.4  HGB 11.9*  HCT 36.4  PLT 188  MCV 99.7  MCH 32.6  MCHC 32.7  RDW 12.6    LYMPHSABS 1.4  MONOABS 1.0  EOSABS 0.3  BASOSABS 0.0   ------------------------------------------------------------------------------------------------------------------  Chemistries  Recent Labs  Lab 06/03/18 1851  NA 142  K 3.5  CL 105  CO2 26  GLUCOSE 96  BUN 18  CREATININE 0.76  CALCIUM 8.8*  AST 19  ALT 8*  ALKPHOS 51  BILITOT 0.9   ------------------------------------------------------------------------------------------------------------------ estimated creatinine clearance is 46.9 mL/min (by C-G formula based on SCr of 0.76 mg/dL). ------------------------------------------------------------------------------------------------------------------ No results for input(s): TSH, T4TOTAL, T3FREE, THYROIDAB in the last 72 hours.  Invalid input(s): FREET3  Coagulation profile No results for input(s): INR, PROTIME in the last 168 hours. ------------------------------------------------------------------------------------------------------------------- No results for input(s): DDIMER in the last 72 hours. -------------------------------------------------------------------------------------------------------------------  Cardiac Enzymes No results for input(s): CKMB, TROPONINI, MYOGLOBIN in the last 168 hours.  Invalid input(s): CK ------------------------------------------------------------------------------------------------------------------    Component Value Date/Time   BNP 392.7 (H) 11/09/2017 1113     ---------------------------------------------------------------------------------------------------------------  Urinalysis    Component Value Date/Time   COLORURINE YELLOW 05/23/2017 0125   APPEARANCEUR HAZY (A) 05/23/2017 0125   LABSPEC 1.006 05/23/2017 0125   PHURINE 7.0 05/23/2017 0125   GLUCOSEU NEGATIVE 05/23/2017 0125   HGBUR SMALL (A) 05/23/2017 0125   BILIRUBINUR NEGATIVE 05/23/2017 0125  KETONESUR NEGATIVE 05/23/2017 0125   PROTEINUR 30 (A)  05/23/2017 0125   UROBILINOGEN 1.0 04/12/2017 1157   NITRITE NEGATIVE 05/23/2017 0125   LEUKOCYTESUR LARGE (A) 05/23/2017 0125    ----------------------------------------------------------------------------------------------------------------   Imaging Results:    Dg Chest 2 View  Result Date: 06/03/2018 CLINICAL DATA:  Cough and hypoxia.  Recent fall. EXAM: CHEST - 2 VIEW COMPARISON:  04/27/2018 FINDINGS: Marked cardiac enlargement remains stable. Aortic atherosclerosis. Large hiatal hernia is again seen. No evidence of pulmonary infiltrate or edema. No evidence of pneumothorax or pleural effusion. IMPRESSION: Stable marked cardiac enlargement and large hiatal hernia. No active lung disease. Electronically Signed   By: Myles Rosenthal M.D.   On: 06/03/2018 18:46   Ct Pelvis Wo Contrast  Result Date: 06/03/2018 CLINICAL DATA:  Lost balance in bathroom and fell with resulting left hip/pelvic pain. EXAM: CT PELVIS WITHOUT CONTRAST TECHNIQUE: Multidetector CT imaging of the pelvis was performed following the standard protocol without intravenous contrast. COMPARISON:  Plain films today. FINDINGS: Urinary Tract: Partially visualize exophytic cyst over the lower pole left kidney. Visualized ureters and bladder are normal. Bowel: Minimal diverticulosis of the colon. Appendix not visualized. Visualize small bowel within normal. Vascular/Lymphatic: Moderate calcified plaque over the abdominal aorta and iliac arteries. No adenopathy. Reproductive:  Within normal. Other:  No free fluid or focal inflammatory change. Musculoskeletal: Subtle minimally displaced fracture of the greater trochanter of the left proximal femur. Minimal degenerative change of the hips bilaterally. Degenerate change of the spine with disc disease the L4-5 and L5-S1 levels. Suggestion of subtle grade 1 anterolisthesis of L4 on L5. Severe bilateral neural foraminal narrowing at the L4-5 and L5-S1 levels. IMPRESSION: Very minimally displaced  fracture of the greater trochanter of the left proximal femur. Additional chronic findings as described. Electronically Signed   By: Elberta Fortis M.D.   On: 06/03/2018 20:18   Dg Hip Unilat W Or Wo Pelvis 2-3 Views Left  Result Date: 06/03/2018 CLINICAL DATA:  Fall in bathroom today. Left hip pain. Initial encounter. EXAM: DG HIP (WITH OR WITHOUT PELVIS) 2-3V LEFT COMPARISON:  None. FINDINGS: There is no evidence of hip fracture or dislocation. There is no evidence of arthropathy or other focal bone abnormality. IMPRESSION: Negative. Electronically Signed   By: Myles Rosenthal M.D.   On: 06/03/2018 18:47   Dg Femur Min 2 Views Left  Result Date: 06/03/2018 CLINICAL DATA:  Fall in bathroom today. Left femur pain. Initial encounter. EXAM: LEFT FEMUR 2 VIEWS COMPARISON:  None. FINDINGS: There is no evidence of fracture or other focal bone lesions. Generalized osteopenia noted. Mild peripheral vascular calcification also seen. IMPRESSION: No acute findings. Electronically Signed   By: Myles Rosenthal M.D.   On: 06/03/2018 18:47      Assessment & Plan:    Principal Problem:   Hypoxia Active Problems:   PAF (paroxysmal atrial fibrillation) (HCC)   Coronary artery disease   Closed displaced fracture of greater trochanter of left femur (HCC)   Hypoxia ? Secondary to Fentanyl vs undiagnosed OSA Monitor Try to avoid iv narcotic  Closed minimally displaced fracture greater trochanter left femur Percocet for pain  NPO after MN Please consult orthopedics in the AM.   Pafib/ CAD Cont metoprolol 12.5mg  po bid Cont Simvastatin 5mg  po qhs xarelto pharmacy to dose  Hypertension Cont Quinapril 40mg  po qday Cont Amlodipine 5mg  po qday  Hypothyroidism Cont levothyroxine 75 micrograms po qday  Anxiety Cont Paxil 40mg  po qday  Gerd Cont PPI  Urinary incontinence  Cont Cardura 8mg  po qhs Cont Myrbetriq 50mg  po qday    DVT Prophylaxis Xarelto SCDs  AM Labs Ordered, also please review Full  Orders  Family Communication: Admission, patients condition and plan of care including tests being ordered have been discussed with the patient  who indicate understanding and agree with the plan and Code Status.  Code Status  FULL CODE  Likely DC to  home  Condition GUARDED   Consults called:  Please call orthopedics in AM  Admission status: inpatient  Time spent in minutes : 45   Pearson Grippe M.D on 06/04/2018 at 1:47 AM  Between 7am to 7pm - Pager - [phone]. After 7pm go to [email or web address] - password [email or web address]  [facility] - Office  [phone]

## 2018-06-05 ENCOUNTER — Observation Stay (HOSPITAL_COMMUNITY): Payer: Medicare PPO

## 2018-06-05 DIAGNOSIS — R52 Pain, unspecified: Secondary | ICD-10-CM

## 2018-06-05 DIAGNOSIS — W19XXXA Unspecified fall, initial encounter: Secondary | ICD-10-CM

## 2018-06-05 DIAGNOSIS — S72112A Displaced fracture of greater trochanter of left femur, initial encounter for closed fracture: Secondary | ICD-10-CM | POA: Diagnosis not present

## 2018-06-05 DIAGNOSIS — I480 Paroxysmal atrial fibrillation: Secondary | ICD-10-CM | POA: Diagnosis not present

## 2018-06-05 DIAGNOSIS — R0902 Hypoxemia: Secondary | ICD-10-CM | POA: Diagnosis not present

## 2018-06-05 DIAGNOSIS — S99921A Unspecified injury of right foot, initial encounter: Secondary | ICD-10-CM | POA: Diagnosis not present

## 2018-06-05 DIAGNOSIS — M79671 Pain in right foot: Secondary | ICD-10-CM | POA: Diagnosis not present

## 2018-06-05 LAB — COMPREHENSIVE METABOLIC PANEL
ALK PHOS: 49 U/L (ref 38–126)
ALT: 7 U/L — AB (ref 14–54)
AST: 15 U/L (ref 15–41)
Albumin: 3 g/dL — ABNORMAL LOW (ref 3.5–5.0)
Anion gap: 5 (ref 5–15)
BUN: 17 mg/dL (ref 6–20)
CALCIUM: 8.6 mg/dL — AB (ref 8.9–10.3)
CHLORIDE: 108 mmol/L (ref 101–111)
CO2: 29 mmol/L (ref 22–32)
CREATININE: 0.86 mg/dL (ref 0.44–1.00)
GFR calc non Af Amer: 59 mL/min — ABNORMAL LOW (ref >60)
GLUCOSE: 98 mg/dL (ref 65–99)
Potassium: 3.5 mmol/L (ref 3.5–5.1)
Sodium: 142 mmol/L (ref 135–145)
Total Bilirubin: 0.6 mg/dL (ref 0.3–1.2)
Total Protein: 6.1 g/dL — ABNORMAL LOW (ref 6.5–8.1)

## 2018-06-05 LAB — CBC WITH DIFFERENTIAL/PLATELET
BASOS ABS: 0 10*3/uL (ref 0.0–0.1)
Basophils Relative: 0 %
EOS ABS: 0.2 10*3/uL (ref 0.0–0.7)
Eosinophils Relative: 3 %
HCT: 34.9 % — ABNORMAL LOW (ref 36.0–46.0)
HEMOGLOBIN: 11.5 g/dL — AB (ref 12.0–15.0)
LYMPHS ABS: 1.2 10*3/uL (ref 0.7–4.0)
LYMPHS PCT: 17 %
MCH: 33.1 pg (ref 26.0–34.0)
MCHC: 33 g/dL (ref 30.0–36.0)
MCV: 100.6 fL — ABNORMAL HIGH (ref 78.0–100.0)
Monocytes Absolute: 0.9 10*3/uL (ref 0.1–1.0)
Monocytes Relative: 13 %
NEUTROS PCT: 67 %
Neutro Abs: 4.6 10*3/uL (ref 1.7–7.7)
Platelets: 170 10*3/uL (ref 150–400)
RBC: 3.47 MIL/uL — AB (ref 3.87–5.11)
RDW: 12.9 % (ref 11.5–15.5)
WBC: 7 10*3/uL (ref 4.0–10.5)

## 2018-06-05 LAB — MAGNESIUM: Magnesium: 1.4 mg/dL — ABNORMAL LOW (ref 1.7–2.4)

## 2018-06-05 LAB — PHOSPHORUS: PHOSPHORUS: 2.8 mg/dL (ref 2.5–4.6)

## 2018-06-05 MED ORDER — IPRATROPIUM-ALBUTEROL 0.5-2.5 (3) MG/3ML IN SOLN
3.0000 mL | Freq: Two times a day (BID) | RESPIRATORY_TRACT | Status: DC
  Administered 2018-06-05 – 2018-06-07 (×4): 3 mL via RESPIRATORY_TRACT
  Filled 2018-06-05 (×4): qty 3

## 2018-06-05 MED ORDER — MAGNESIUM SULFATE 50 % IJ SOLN
3.0000 g | Freq: Once | INTRAVENOUS | Status: AC
  Administered 2018-06-05: 3 g via INTRAVENOUS
  Filled 2018-06-05: qty 6

## 2018-06-05 NOTE — Progress Notes (Signed)
[facility]  Patient Status: Active (receiving services up to time of hospitalization)  [facility] is providing the following services: RN, PT and OT  If patient discharges after hours, please call [phone].   Christina Ortiz 06/05/2018, 9:24 AM

## 2018-06-05 NOTE — Progress Notes (Signed)
PROGRESS NOTE    Christina Ortiz  QMV:[id number] DOB: Jan 10, 1929 DOA: 06/03/2018 PCP: William Hamburger D, NP   Brief Narrative:  Patient is a very pleasant 84 year old female with a past medical history significant for hypertension, Hyperlipidemia, CKD stage III, Paroxysmal Atrial Fibrillation, coronary artery disease, history of gastritis, history of depression, history of hypothyroidism, and other comorbidities who presented to [facility] emergency room after a chief complaint of a fall.  CT scan of the pelvis showed a very minimally displaced fracture of the greater trochanter of the left proximal femur and the on-call orthopedic recommended that she be discharged home and follow-up with her primary orthopedic surgeon Dr. Aundria Rud in outpatient setting.  However prior to discharged from the ED patient was unable to bear weight and stand and almost fell again and was noted to be hypoxic.  Is unclear why the patient was hypoxic but it may been felt secondary to fentanyl administration so she was admitted for further evaluation and work-up.    I discussed the case with Dr. Rennis Chris of Orthopedics today who evaluated the patient and felt it was a stable injury and allowed weightbearing as tolerated with walker and recommended no operative intervention at this time. PT evaluated and recommending SNF.   In regards to her Hypoxia we added duo nebs, flutter valve, incentive spirometer and repeated chest x-ray for patient's hypoxia.  Will wean patient's oxygen as necessary and continue pain control for patient's closed minimally displaced fracture of the greater trochanter of the left femur and Orthopedics recommends rescheduling follow up visit for 2-3 weeks outpatient.   Assessment & Plan:   Principal Problem:   Hypoxia Active Problems:   PAF (paroxysmal atrial fibrillation) (HCC)   Coronary artery disease   Closed displaced fracture of greater trochanter of left femur (HCC)  Acute Respiratory Failure  with Hypoxia, improving  -? Secondary to Fentanyl vs undiagnosed OSA -Added DuoNeb 3 mL BID -C/w Incentive Spirometry and Flutter Valve -Continue supplemental O2 via nasal cannula and wean oxygen as tolerated -Continuous pulse oximetry and maintain O2 saturation greater than 90% -OOB and Home Ambulatory Screen to be done -Repeat CXR this AM showed Stable marked cardiac enlargement and large hiatal hernia. No active lung disease. -Saturations today fluctuated between 88 and 91% on room air while ambulatory -Continue to monitor and repeat home O2 screen in AM -Last Checked and was on 1 Liter about an hour ago  -Will attempt to wean off O2   Closed minimally displaced fracture greater trochanter left femur -Seen on CT Scan -Orthopedics Consulted and Dr. Rennis Chris feels that clinically the fracture demonstrates healing with good motion and that it is a stable injury -No operative intervention at this time and Patient to be Weightbearing as Tolerated with Walker -Follow up with Dr. Aundria Rud in [facility] in 2-3 weeks -PT/OT Evaluated and recommending SNF -Pain Control with oxycodone-acetaminophen 1 tablet p.o. every 4 hours as needed for moderate pain -Social worker consulted for assistance placement in skilled nursing facility.  Right Foot Pain -Obtain Right Foot Complete X-Ray   Paroxysmal Atrial Fibrillation -C/w Metoprolol 12.5mg  po bid and Rivaroxaban 15 mg po Daily  -C/w Telemetry   CAD s/p Stenting to RCA and LAD -C/w Metoprolol 12.5 mg po BID, Lisinopril 40 mg po Daily, and Simvastatin 5mg  po qhs  HLD -C/w Simvastatin 5mg  po qhs  Essential Hypertension -C/w Quinapril 40mg  po Pharmacy Substitution Lisinopril 40 mg po Daily and c/w Amlodipine 5mg  po qday -C/w Metoprolol 12.5 mg po BID  Hypothyroidism -C/w Levothyroxine 75 micrograms po qday  Anxiety and Depression -C/w Paroxetine 40 mg po Daily  GERD -C/w PPI Pantoprazole 40 mg po Daily   Urinary  Incontinence -C/w Doxazosin 8mg  po qhs and Mirabergon ER  50mg  po qday  Hypomagnesemia -Patient's magnesium level was 1.4 this morning -Replete with IV mag sulfate 3 g -Continue to monitor and replete as necessary -Repeat magnesium level in a.m.   Macrocytic Anemia -MCV mildly elevated at 100.6 -Hemoglobin/hematocrit stable at 11.5/34.9 -Continue to monitor for signs of diabetes -Repeat CBC in a.m.  DVT prophylaxis: Anticoagulated with Rivaroxaban 15 mg po Daily for Atrial Fibrillation  Code Status: FULL CODE Family Communication: No family present at bedside  Disposition Plan: SNF in next 24-48 hours  Consultants:   Orthopedic Surgery Dr. Francena Hanly  Procedures: None  Antimicrobials:  Anti-infectives (From admission, onward)   None     Subjective: Seen and examined at bedside and did not know where she is hypoxic yesterday.  No chest pain, shortness breath, nausea, vomiting.  States that she has pain in her hip when she bears weight.  Also complained of some right foot pain today.  No other complaints or concerns at this time.  Objective: Vitals:   06/04/18 2145 06/05/18 0511 06/05/18 0814 06/05/18 1306  BP: 119/67 (!) 147/88  113/73  Pulse: 69 70  (!) 58  Resp: 16 18  18   Temp: 98.6 F (37 C) 97.8 F (36.6 C)  98.6 F (37 C)  TempSrc: Oral Oral  Oral  SpO2:  94% 98% 92%  Weight:  70.1 kg (154 lb 8.7 oz)    Height:        Intake/Output Summary (Last 24 hours) at 06/05/2018 1415 Last data filed at 06/05/2018 1302 Gross per 24 hour  Intake 1006 ml  Output 1100 ml  Net -94 ml   Filed Weights   06/03/18 1929 06/04/18 0600 06/05/18 0511  Weight: 68.5 kg (151 lb) 68.4 kg (150 lb 12.7 oz) 70.1 kg (154 lb 8.7 oz)   Examination: Physical Exam:  Constitutional: WN/WD overweight Caucasian female in NAD and appears calm  Eyes: Lds and conjunctivae normal, sclerae anicteric  ENMT: External Ears, Nose appear normal. Grossly normal hearing. Mucous membranes are  moist Neck: Appears normal, supple, no cervical masses, normal ROM, no appreciable thyromegaly; no JVD Respiratory: Diminished to auscultation bilaterally, no wheezing, rales, rhonchi or crackles. Normal respiratory effort and patient is not tachypenic. No accessory muscle use. Wearing 1 Liter of Supplemental O2 via [place] Cardiovascular: RRR, no murmurs / rubs / gallops. S1 and S2 auscultated. Trace edema Abdomen: Soft, non-tender, Distended 2/2 body habitus. No masses palpated. No appreciable hepatosplenomegaly. Bowel sounds positive x4.  GU: Deferred. Musculoskeletal: No clubbing / cyanosis of digits/nails. No joint deformity upper and lower extremities. Has pain when plantarflexes left Leg. Complains of pain on Right foot palpation  Skin: No rashes, lesions, ulcers on a limited skin evaluation. No induration; Warm and dry.  Neurologic: CN 2-12 grossly intact with no focal deficits. Romberg sign cerebellar and reflexes not assessed.  Psychiatric: Normal judgment and insight. Alert and oriented x 3. Normal mood and appropriate affect.   Data Reviewed: I have personally reviewed following labs and imaging studies  CBC: Recent Labs  Lab 06/03/18 1851 06/04/18 0350 06/05/18 0432  WBC 9.4 7.1 7.0  NEUTROABS 6.7  --  4.6  HGB 11.9* 11.3* 11.5*  HCT 36.4 34.3* 34.9*  MCV 99.7 98.8 100.6*  PLT 188 189 170  Basic Metabolic Panel: Recent Labs  Lab 06/03/18 1851 06/04/18 0350 06/05/18 0432  NA 142 141 142  K 3.5 3.3* 3.5  CL 105 107 108  CO2 26 25 29   GLUCOSE 96 136* 98  BUN 18 15 17   CREATININE 0.76 0.80 0.86  CALCIUM 8.8* 8.5* 8.6*  MG  --   --  1.4*  PHOS  --   --  2.8   GFR: Estimated Creatinine Clearance: 44.1 mL/min (by C-G formula based on SCr of 0.86 mg/dL). Liver Function Tests: Recent Labs  Lab 06/03/18 1851 06/04/18 0350 06/05/18 0432  AST 19 19 15   ALT 8* 7* 7*  ALKPHOS 51 43 49  BILITOT 0.9 0.8 0.6  PROT 6.6 5.9* 6.1*  ALBUMIN 3.4* 3.0* 3.0*   No results  for input(s): LIPASE, AMYLASE in the last 168 hours. No results for input(s): AMMONIA in the last 168 hours. Coagulation Profile: No results for input(s): INR, PROTIME in the last 168 hours. Cardiac Enzymes: No results for input(s): CKTOTAL, CKMB, CKMBINDEX, TROPONINI in the last 168 hours. BNP (last 3 results) No results for input(s): PROBNP in the last 8760 hours. HbA1C: No results for input(s): HGBA1C in the last 72 hours. CBG: No results for input(s): GLUCAP in the last 168 hours. Lipid Profile: No results for input(s): CHOL, HDL, LDLCALC, TRIG, CHOLHDL, LDLDIRECT in the last 72 hours. Thyroid Function Tests: No results for input(s): TSH, T4TOTAL, FREET4, T3FREE, THYROIDAB in the last 72 hours. Anemia Panel: No results for input(s): VITAMINB12, FOLATE, FERRITIN, TIBC, IRON, RETICCTPCT in the last 72 hours. Sepsis Labs: No results for input(s): PROCALCITON, LATICACIDVEN in the last 168 hours.  No results found for this or any previous visit (from the past 240 hour(s)).   Radiology Studies: Dg Chest 2 View  Result Date: 06/03/2018 CLINICAL DATA:  Cough and hypoxia.  Recent fall. EXAM: CHEST - 2 VIEW COMPARISON:  04/27/2018 FINDINGS: Marked cardiac enlargement remains stable. Aortic atherosclerosis. Large hiatal hernia is again seen. No evidence of pulmonary infiltrate or edema. No evidence of pneumothorax or pleural effusion. IMPRESSION: Stable marked cardiac enlargement and large hiatal hernia. No active lung disease. Electronically Signed   By: Myles Rosenthal M.D.   On: 06/03/2018 18:46   Ct Pelvis Wo Contrast  Result Date: 06/03/2018 CLINICAL DATA:  Lost balance in bathroom and fell with resulting left hip/pelvic pain. EXAM: CT PELVIS WITHOUT CONTRAST TECHNIQUE: Multidetector CT imaging of the pelvis was performed following the standard protocol without intravenous contrast. COMPARISON:  Plain films today. FINDINGS: Urinary Tract: Partially visualize exophytic cyst over the lower pole  left kidney. Visualized ureters and bladder are normal. Bowel: Minimal diverticulosis of the colon. Appendix not visualized. Visualize small bowel within normal. Vascular/Lymphatic: Moderate calcified plaque over the abdominal aorta and iliac arteries. No adenopathy. Reproductive:  Within normal. Other:  No free fluid or focal inflammatory change. Musculoskeletal: Subtle minimally displaced fracture of the greater trochanter of the left proximal femur. Minimal degenerative change of the hips bilaterally. Degenerate change of the spine with disc disease the L4-5 and L5-S1 levels. Suggestion of subtle grade 1 anterolisthesis of L4 on L5. Severe bilateral neural foraminal narrowing at the L4-5 and L5-S1 levels. IMPRESSION: Very minimally displaced fracture of the greater trochanter of the left proximal femur. Additional chronic findings as described. Electronically Signed   By: Elberta Fortis M.D.   On: 06/03/2018 20:18   Dg Chest Port 1 View  Result Date: 06/04/2018 CLINICAL DATA:  Worsening hypoxia.  Left hip fracture.  EXAM: PORTABLE CHEST 1 VIEW COMPARISON:  06/03/2018 FINDINGS: Stable marked cardiac enlargement. Large hiatal hernia again noted. Aortic atherosclerosis. No evidence of pulmonary infiltrate or edema. IMPRESSION: Stable marked cardiac enlargement and large hiatal hernia. No active lung disease. Electronically Signed   By: Myles Rosenthal M.D.   On: 06/04/2018 13:50   Dg Hip Unilat W Or Wo Pelvis 2-3 Views Left  Result Date: 06/03/2018 CLINICAL DATA:  Fall in bathroom today. Left hip pain. Initial encounter. EXAM: DG HIP (WITH OR WITHOUT PELVIS) 2-3V LEFT COMPARISON:  None. FINDINGS: There is no evidence of hip fracture or dislocation. There is no evidence of arthropathy or other focal bone abnormality. IMPRESSION: Negative. Electronically Signed   By: Myles Rosenthal M.D.   On: 06/03/2018 18:47   Dg Femur Min 2 Views Left  Result Date: 06/03/2018 CLINICAL DATA:  Fall in bathroom today. Left femur pain.  Initial encounter. EXAM: LEFT FEMUR 2 VIEWS COMPARISON:  None. FINDINGS: There is no evidence of fracture or other focal bone lesions. Generalized osteopenia noted. Mild peripheral vascular calcification also seen. IMPRESSION: No acute findings. Electronically Signed   By: Myles Rosenthal M.D.   On: 06/03/2018 18:47   Scheduled Meds: . amLODipine  5 mg Oral Daily  . cholecalciferol  1,000 Units Oral QPM  . doxazosin  8 mg Oral Daily  . folic acid  1 mg Oral Daily  . ipratropium-albuterol  3 mL Nebulization BID  . levothyroxine  75 mcg Oral QAC breakfast  . lisinopril  40 mg Oral Daily  . metoprolol tartrate  12.5 mg Oral BID  . mirabegron ER  50 mg Oral Daily  . multivitamin  1 tablet Oral Daily  . pantoprazole  40 mg Oral Daily  . PARoxetine  40 mg Oral Daily  . polyvinyl alcohol  1 drop Both Eyes Daily  . potassium chloride  40 mEq Oral Daily  . rivaroxaban  15 mg Oral Q supper  . simvastatin  5 mg Oral QPM   Continuous Infusions:   LOS: 0 days   Merlene Laughter, DO [facility] Pager [phone]  If 7PM-7AM, please contact night-coverage [email or web address] Password [email or web address] 06/05/2018, 2:15 PM

## 2018-06-05 NOTE — Progress Notes (Signed)
Physical Therapy Treatment Patient Details Name: Christina Ortiz MRN: [id number] DOB: [birth year] Today's Date: 06/05/2018    History of Present Illness 84 y.o. female admitted with hypoxia, pain L hip after sustaining a fall at home. Hx of R prox humerus fx 03/2018, PAF, bronchitis, HTN, depression, CKD, CAD, heart block.  CT (+) nondisplaced greater trochanter fracture of the left proximal femur.     PT Comments    Progressing slowly with mobility. Pt c/o pain with activity. She will need ST rehab at [facility].    Follow Up Recommendations  SNF     Equipment Recommendations  None recommended by PT    Recommendations for Other Services       Precautions / Restrictions Precautions Precautions: Fall Restrictions Weight Bearing Restrictions: Yes LLE Weight Bearing: Weight bearing as tolerated Other Position/Activity Restrictions: R UE- ad lib use per Dr Rennis Chris progress note    Mobility  Bed Mobility Overal bed mobility: Needs Assistance Bed Mobility: Supine to Sit     Supine to sit: HOB elevated;Min assist     General bed mobility comments: Assist for trunk and to scoot towards EOB. Increased time. Pt relied on bedrail. Cues for technique.   Transfers Overall transfer level: Needs assistance Equipment used: Rolling walker (2 wheeled) Transfers: Sit to/from Stand Sit to Stand: Mod assist;From elevated surface         General transfer comment: Multiple attempt to get to standing. VCs safety, hand placement. Assist to shift weigth anteriorly, rise, stabilize, and control descent to recliner.   Ambulation/Gait Ambulation/Gait assistance: Min assist Ambulation Distance (Feet): 20 Feet Assistive device: Rolling walker (2 wheeled) Gait Pattern/deviations: Step-to pattern;Antalgic;Decreased stance time - left     General Gait Details: Assist to stabilize pt and maneuver safely with RW. VCs safety, sequence, technique, distance from RW, posture. Difficult to get consistent O2  reading-sats fluctated between 88-91% on RA.    Stairs             Wheelchair Mobility    Modified Rankin (Stroke Patients Only)       Balance Overall balance assessment: Needs assistance;History of Falls         Standing balance support: Bilateral upper extremity supported Standing balance-Leahy Scale: Poor                              Cognition Arousal/Alertness: Awake/alert Behavior During Therapy: WFL for tasks assessed/performed Overall Cognitive Status: Within Functional Limits for tasks assessed                                        Exercises      General Comments        Pertinent Vitals/Pain Pain Assessment: Faces Faces Pain Scale: Hurts even more Pain Location: L hip with activity Pain Descriptors / Indicators: Grimacing Pain Intervention(s): Limited activity within patient's tolerance;Repositioned    Home Living                      Prior Function            PT Goals (current goals can now be found in the care plan section) Progress towards PT goals: Progressing toward goals    Frequency    Min 3X/week      PT Plan Current plan remains appropriate    Co-evaluation  AM-PAC PT "6 Clicks" Daily Activity  Outcome Measure  Difficulty turning over in bed (including adjusting bedclothes, sheets and blankets)?: A Lot Difficulty moving from lying on back to sitting on the side of the bed? : Unable Difficulty sitting down on and standing up from a chair with arms (e.g., wheelchair, bedside commode, etc,.)?: Unable Help needed moving to and from a bed to chair (including a wheelchair)?: A Lot Help needed walking in hospital room?: A Little Help needed climbing 3-5 steps with a railing? : A Lot 6 Click Score: 11    End of Session Equipment Utilized During Treatment: Gait belt Activity Tolerance: Patient limited by pain;Patient limited by fatigue Patient left: in chair;with call  bell/phone within reach   PT Visit Diagnosis: Unsteadiness on feet (R26.81);History of falling (Z91.81);Difficulty in walking, not elsewhere classified (R26.2);Pain;Other abnormalities of gait and mobility (R26.89) Pain - Right/Left: Left Pain - part of body: Hip     Time: 9983-3825 PT Time Calculation (min) (ACUTE ONLY): 28 min  Charges:  $Gait Training: 8-22 mins $Therapeutic Activity: 8-22 mins                    G Codes:         Rebeca Alert, MPT Pager: [phone]

## 2018-06-06 ENCOUNTER — Observation Stay (HOSPITAL_COMMUNITY): Payer: Medicare PPO

## 2018-06-06 DIAGNOSIS — R0902 Hypoxemia: Secondary | ICD-10-CM | POA: Diagnosis not present

## 2018-06-06 DIAGNOSIS — R52 Pain, unspecified: Secondary | ICD-10-CM | POA: Diagnosis not present

## 2018-06-06 DIAGNOSIS — W19XXXA Unspecified fall, initial encounter: Secondary | ICD-10-CM | POA: Diagnosis not present

## 2018-06-06 DIAGNOSIS — S72112A Displaced fracture of greater trochanter of left femur, initial encounter for closed fracture: Secondary | ICD-10-CM | POA: Diagnosis not present

## 2018-06-06 DIAGNOSIS — I480 Paroxysmal atrial fibrillation: Secondary | ICD-10-CM | POA: Diagnosis not present

## 2018-06-06 DIAGNOSIS — J90 Pleural effusion, not elsewhere classified: Secondary | ICD-10-CM | POA: Diagnosis not present

## 2018-06-06 LAB — CBC WITH DIFFERENTIAL/PLATELET
BASOS ABS: 0 10*3/uL (ref 0.0–0.1)
Basophils Relative: 0 %
EOS ABS: 0.3 10*3/uL (ref 0.0–0.7)
EOS PCT: 4 %
HCT: 36 % (ref 36.0–46.0)
Hemoglobin: 11.7 g/dL — ABNORMAL LOW (ref 12.0–15.0)
LYMPHS PCT: 17 %
Lymphs Abs: 1.2 10*3/uL (ref 0.7–4.0)
MCH: 32.1 pg (ref 26.0–34.0)
MCHC: 32.5 g/dL (ref 30.0–36.0)
MCV: 98.9 fL (ref 78.0–100.0)
MONO ABS: 1.1 10*3/uL — AB (ref 0.1–1.0)
Monocytes Relative: 14 %
Neutro Abs: 4.9 10*3/uL (ref 1.7–7.7)
Neutrophils Relative %: 65 %
PLATELETS: 163 10*3/uL (ref 150–400)
RBC: 3.64 MIL/uL — AB (ref 3.87–5.11)
RDW: 12.8 % (ref 11.5–15.5)
WBC: 7.5 10*3/uL (ref 4.0–10.5)

## 2018-06-06 LAB — COMPREHENSIVE METABOLIC PANEL
ALBUMIN: 2.9 g/dL — AB (ref 3.5–5.0)
ALT: 7 U/L — AB (ref 14–54)
AST: 13 U/L — AB (ref 15–41)
Alkaline Phosphatase: 50 U/L (ref 38–126)
Anion gap: 7 (ref 5–15)
BUN: 17 mg/dL (ref 6–20)
CHLORIDE: 107 mmol/L (ref 101–111)
CO2: 27 mmol/L (ref 22–32)
CREATININE: 0.75 mg/dL (ref 0.44–1.00)
Calcium: 8.7 mg/dL — ABNORMAL LOW (ref 8.9–10.3)
GFR calc Af Amer: >60 mL/min (ref >60)
Glucose, Bld: 102 mg/dL — ABNORMAL HIGH (ref 65–99)
POTASSIUM: 3.6 mmol/L (ref 3.5–5.1)
SODIUM: 141 mmol/L (ref 135–145)
Total Bilirubin: 0.8 mg/dL (ref 0.3–1.2)
Total Protein: 6.3 g/dL — ABNORMAL LOW (ref 6.5–8.1)

## 2018-06-06 LAB — MAGNESIUM: MAGNESIUM: 2 mg/dL (ref 1.7–2.4)

## 2018-06-06 LAB — PHOSPHORUS: Phosphorus: 3 mg/dL (ref 2.5–4.6)

## 2018-06-06 NOTE — Progress Notes (Signed)
PROGRESS NOTE    Christina Ortiz  MSX:[id number] DOB: December 25, 1929 DOA: 06/03/2018 PCP: William Hamburger D, NP   Brief Narrative:  Patient is a very pleasant 84 year old female with a past medical history significant for hypertension, Hyperlipidemia, CKD stage III, Paroxysmal Atrial Fibrillation, coronary artery disease, history of gastritis, history of depression, history of hypothyroidism, and other comorbidities who presented to [facility] emergency room after a chief complaint of a fall.  CT scan of the pelvis showed a very minimally displaced fracture of the greater trochanter of the left proximal femur and the on-call orthopedic recommended that she be discharged home and follow-up with her primary orthopedic surgeon Dr. Aundria Rud in outpatient setting.  However prior to discharged from the ED patient was unable to bear weight and stand and almost fell again and was noted to be hypoxic.  Is unclear why the patient was hypoxic but it may been felt secondary to fentanyl administration so she was admitted for further evaluation and work-up.    I discussed the case with Dr. Rennis Chris of Orthopedics today who evaluated the patient and felt it was a stable injury and allowed weightbearing as tolerated with walker and recommended no operative intervention at this time. PT evaluated and recommending SNF.   In regards to her Hypoxia we added duo nebs, flutter valve, incentive spirometer and repeated chest x-ray for patient's hypoxia.  Weaned patient's oxygen as necessary and continue pain control for patient's closed minimally displaced fracture of the greater trochanter of the left femur and Orthopedics recommends rescheduling follow up visit for 2-3 weeks outpatient. She was deemed medically stable to D/C to SNF and cannot go until tomorrow when she gets English as a second language teacher.  Assessment & Plan:   Principal Problem:   Hypoxia Active Problems:   PAF (paroxysmal atrial fibrillation) (HCC)   Coronary artery  disease   Closed displaced fracture of greater trochanter of left femur (HCC)  Acute Respiratory Failure with Hypoxia, improved -? Secondary to Fentanyl vs undiagnosed OSA -Added DuoNeb 3 mL BID -C/w Incentive Spirometry and Flutter Valve -Continue supplemental O2 via nasal cannula and wean oxygen as tolerated -Continuous pulse oximetry and maintain O2 saturation greater than 90% -OOB and Home Ambulatory Screen to be done -Repeat CXR this AM showed Stable marked cardiac enlargement and large hiatal hernia. No active lung disease. -Saturations today fluctuated between 88 and 91% on room air while ambulatory -Continue to monitor and repeat home O2 screen in AM -Weaned off of O2 and doing well now  -CXR today showed Stable cardiomediastinal silhouette with mild cardiomegaly. Nopneumothorax. Stable small bilateral pleural effusions. No overt pulmonary edema. Mild bibasilar lung opacities, unchanged.  Closed minimally displaced fracture greater trochanter left femur -Seen on CT Scan -Orthopedics Consulted and Dr. Rennis Chris feels that clinically the fracture demonstrates healing with good motion and that it is a stable injury -No operative intervention at this time and Patient to be Weightbearing as Tolerated with Walker -Follow up with Dr. Aundria Rud in [facility] in 2-3 weeks -PT/OT Evaluated and recommending SNF -Pain Control with oxycodone-acetaminophen 1 tablet p.o. every 4 hours as needed for moderate pain -Social worker consulted for assistance placement in skilled nursing facility -Patient has placement at [facility] but requiring English as a second language teacher.  Right Foot Pain -Obtained Right Foot Complete X-Ray and showed no There is no evidence of fracture or dislocation. Minimal arthritic changes of the IP joints and of the first MTP joint with slight bunion formation on the head of the first metatarsal.  Paroxysmal Atrial  Fibrillation -C/w Metoprolol 12.5mg  po bid and Rivaroxaban 15 mg po Daily   -C/w Telemetry   CAD s/p Stenting to RCA and LAD -C/w Metoprolol 12.5 mg po BID, Lisinopril 40 mg po Daily, and Simvastatin 5mg  po qhs  HLD -C/w Simvastatin 5mg  po qhs  Essential Hypertension -C/w Quinapril 40mg  po Pharmacy Substitution Lisinopril 40 mg po Daily and c/w Amlodipine 5mg  po qday -C/w Metoprolol 12.5 mg po BID   Hypothyroidism -C/w Levothyroxine 75 micrograms po qday  Anxiety and Depression -C/w Paroxetine 40 mg po Daily  GERD -C/w PPI Pantoprazole 40 mg po Daily   Urinary Incontinence -C/w Doxazosin 8mg  po qhs and Mirabergon ER  50mg  po qday  Hypomagnesemia -Patient's magnesium level was 1.4 and improved to 2.0 this AM -Replete with IV mag sulfate 3 g yesterday -Continue to monitor and replete as necessary -Repeat magnesium level in a.m.   Macrocytic Anemia -MCV mildly elevated at 100.6 yesterday and is now 98.9 -Hemoglobin/hematocrit stable at 11.5/34.9 -> 11.7/36.0 -Continue to monitor for signs of diabetes -Repeat CBC in a.m.  DVT prophylaxis: Anticoagulated with Rivaroxaban 15 mg po Daily for Atrial Fibrillation  Code Status: FULL CODE Family Communication: No family present at bedside  Disposition Plan: SNF tomorrow   Consultants:   Orthopedic Surgery Dr. Francena Hanly  Procedures: None  Antimicrobials:  Anti-infectives (From admission, onward)   None     Subjective: Seen and examined at bedside and had not complaints. Respiratory status was improved and pain was under control. No awaiting SNF placement and likely in AM. No other concerns or complaints at this time.   Objective: Vitals:   06/05/18 2123 06/06/18 0631 06/06/18 0827 06/06/18 1346  BP: 127/78 (!) 156/74  127/86  Pulse: 68 65  75  Resp: 16 18  16   Temp: 99.6 F (37.6 C) 98 F (36.7 C)  98.2 F (36.8 C)  TempSrc: Oral Oral  Oral  SpO2: 94% 96% 93% 91%  Weight:  69.5 kg (153 lb 3.2 oz)    Height:       No intake or output data in the 24 hours ending 06/06/18  1850 Filed Weights   06/04/18 0600 06/05/18 0511 06/06/18 0631  Weight: 68.4 kg (150 lb 12.7 oz) 70.1 kg (154 lb 8.7 oz) 69.5 kg (153 lb 3.2 oz)   Examination: Physical Exam:  Constitutional: Well-nourished, well-developed overweight Caucasian female who is currently in no acute distress appears calm and is very pleasant Eyes: Sclera anicteric.  Lids and conjunctive are normal. ENMT: External ears and nose appear normal.  Grossly normal hearing Neck: Neck is supple with no appreciable JVD Respiratory: Diminished to auscultation bilaterally with no appreciable wheezing, rales, rhonchi Cardiovascular: Regular rate and rhythm.  No appreciable murmurs, rubs, gallops.  Trace lower extremity edema Abdomen: Soft, nontender, slightly distended skin body habitus.  Bowel sounds present 4 quadrants  GU: Deferred Musculoskeletal: Contractures or cyanosis.  No joint deformities. Skin: No appreciable rashes or lesions on limited skin evaluation Neurologic: Cranial nerves II through XII grossly intact with no appreciable focal deficits. Psychiatric: Normal mood and affect.  Intact judgment insight.  Patient awake and alert and oriented x3.  Data Reviewed: I have personally reviewed following labs and imaging studies  CBC: Recent Labs  Lab 06/03/18 1851 06/04/18 0350 06/05/18 0432 06/06/18 0405  WBC 9.4 7.1 7.0 7.5  NEUTROABS 6.7  --  4.6 4.9  HGB 11.9* 11.3* 11.5* 11.7*  HCT 36.4 34.3* 34.9* 36.0  MCV 99.7 98.8 100.6* 98.9  PLT 188 189 170 163   Basic Metabolic Panel: Recent Labs  Lab 06/03/18 1851 06/04/18 0350 06/05/18 0432 06/06/18 0405  NA 142 141 142 141  K 3.5 3.3* 3.5 3.6  CL 105 107 108 107  CO2 26 25 29 27   GLUCOSE 96 136* 98 102*  BUN 18 15 17 17   CREATININE 0.76 0.80 0.86 0.75  CALCIUM 8.8* 8.5* 8.6* 8.7*  MG  --   --  1.4* 2.0  PHOS  --   --  2.8 3.0   GFR: Estimated Creatinine Clearance: 47.2 mL/min (by C-G formula based on SCr of 0.75 mg/dL). Liver Function  Tests: Recent Labs  Lab 06/03/18 1851 06/04/18 0350 06/05/18 0432 06/06/18 0405  AST 19 19 15  13*  ALT 8* 7* 7* 7*  ALKPHOS 51 43 49 50  BILITOT 0.9 0.8 0.6 0.8  PROT 6.6 5.9* 6.1* 6.3*  ALBUMIN 3.4* 3.0* 3.0* 2.9*   No results for input(s): LIPASE, AMYLASE in the last 168 hours. No results for input(s): AMMONIA in the last 168 hours. Coagulation Profile: No results for input(s): INR, PROTIME in the last 168 hours. Cardiac Enzymes: No results for input(s): CKTOTAL, CKMB, CKMBINDEX, TROPONINI in the last 168 hours. BNP (last 3 results) No results for input(s): PROBNP in the last 8760 hours. HbA1C: No results for input(s): HGBA1C in the last 72 hours. CBG: No results for input(s): GLUCAP in the last 168 hours. Lipid Profile: No results for input(s): CHOL, HDL, LDLCALC, TRIG, CHOLHDL, LDLDIRECT in the last 72 hours. Thyroid Function Tests: No results for input(s): TSH, T4TOTAL, FREET4, T3FREE, THYROIDAB in the last 72 hours. Anemia Panel: No results for input(s): VITAMINB12, FOLATE, FERRITIN, TIBC, IRON, RETICCTPCT in the last 72 hours. Sepsis Labs: No results for input(s): PROCALCITON, LATICACIDVEN in the last 168 hours.  No results found for this or any previous visit (from the past 240 hour(s)).   Radiology Studies: Dg Chest Port 1 View  Result Date: 06/06/2018 CLINICAL DATA:  Hypoxia EXAM: PORTABLE CHEST 1 VIEW COMPARISON:  06/04/2018 chest radiograph. FINDINGS: Stable cardiomediastinal silhouette with mild cardiomegaly. No pneumothorax. Stable small bilateral pleural effusions. No overt pulmonary edema. Mild bibasilar lung opacities, unchanged. IMPRESSION: 1. Stable cardiomegaly without overt pulmonary edema. 2. Stable small bilateral pleural effusions. 3. Stable mild bibasilar lung opacities. Electronically Signed   By: Delbert Phenix M.D.   On: 06/06/2018 09:20   Dg Foot Complete Right  Result Date: 06/05/2018 CLINICAL DATA:  Right foot pain since a fall several days  ago. EXAM: RIGHT FOOT COMPLETE - 3+ VIEW COMPARISON:  None. FINDINGS: There is no evidence of fracture or dislocation. Minimal arthritic changes of the IP joints and of the first MTP joint with slight bunion formation on the head of the first metatarsal. IMPRESSION: No acute abnormality.  Slight arthritic changes as described. Electronically Signed   By: Francene Boyers M.D.   On: 06/05/2018 16:50   Scheduled Meds: . amLODipine  5 mg Oral Daily  . cholecalciferol  1,000 Units Oral QPM  . doxazosin  8 mg Oral Daily  . folic acid  1 mg Oral Daily  . ipratropium-albuterol  3 mL Nebulization BID  . levothyroxine  75 mcg Oral QAC breakfast  . lisinopril  40 mg Oral Daily  . metoprolol tartrate  12.5 mg Oral BID  . mirabegron ER  50 mg Oral Daily  . multivitamin  1 tablet Oral Daily  . pantoprazole  40 mg Oral Daily  . PARoxetine  40 mg  Oral Daily  . polyvinyl alcohol  1 drop Both Eyes Daily  . potassium chloride  40 mEq Oral Daily  . rivaroxaban  15 mg Oral Q supper  . simvastatin  5 mg Oral QPM   Continuous Infusions:   LOS: 0 days   Merlene Laughter, DO [facility] Pager [phone]  If 7PM-7AM, please contact night-coverage [email or web address] Password [email or web address] 06/06/2018, 6:50 PM

## 2018-06-06 NOTE — [place] FL2 (Signed)
[place] MEDICAID FL2 LEVEL OF CARE SCREENING TOOL     IDENTIFICATION  Patient Name: Christina Ortiz Birthdate: [birth year] Sex: female Admission Date (Current Location): 06/03/2018  [facility] and [place] Number:  Producer, television/film/video and Address:  [facility],  [address] [place], [place] [address]      Provider Number: [id number]  Attending Physician Name and Address:  Merlene Laughter, DO  Relative Name and Phone Number:  [name] [phone]    Current Level of Care: Hospital Recommended Level of Care: Skilled Nursing Facility Prior Approval Number:    Date Approved/Denied:   PASRR Number:    Discharge Plan: SNF    Current Diagnoses: Patient Active Problem List   Diagnosis Date Noted  . Closed displaced fracture of greater trochanter of left femur (HCC) 06/04/2018  . Hypoxia 06/03/2018  . Hospital discharge follow-up 04/27/2018  . Fall 03/31/2018  . Humerus shaft fracture-right 03/31/2018  . Rhabdomyolysis 03/31/2018  . Hypothermia 03/31/2018  . Healthcare maintenance 01/17/2018  . Nocturia 01/17/2018  . Macrocytosis 11/22/2017  . Acute bronchitis due to respiratory syncytial virus (RSV) 11/09/2017  . Acute respiratory failure with hypoxia (HCC) 11/09/2017  . Hypertension 11/09/2017  . PAF (paroxysmal atrial fibrillation) (HCC) 11/09/2017  . Coronary artery disease 11/09/2017  . CKD (chronic kidney disease) stage 2, GFR 60-89 ml/min 11/09/2017  . Hypothyroidism 11/09/2017  . Dyspnea 11/08/2017  . Upper respiratory tract infection 11/08/2017  . Elevated serum creatinine 06/21/2017  . Edema of both ankles 06/21/2017  . Diarrhea 05/25/2017  . Hypokalemia 05/25/2017  . Alcohol abuse 05/25/2017  . Depression, recurrent (HCC) 03/30/2017  . Lumbago 03/30/2017  . Influenza with respiratory manifestation 01/22/2017  . Coronary artery disease due to lipid rich plaque 04/04/2015  . Essential hypertension 04/04/2015  . Hyperlipidemia  04/04/2015  . Former smoker 04/04/2015  . Chronic anticoagulation 04/04/2015    Orientation RESPIRATION BLADDER Height & Weight     Self, Situation, Place  O2 Incontinent Weight: 153 lb 3.2 oz (69.5 kg) Height:  5\' 3"  (160 cm)  BEHAVIORAL SYMPTOMS/MOOD NEUROLOGICAL BOWEL NUTRITION STATUS      Incontinent Diet(see dc summary)  AMBULATORY STATUS COMMUNICATION OF NEEDS Skin   Limited Assist Verbally Normal                       Personal Care Assistance Level of Assistance  Bathing, Feeding, Dressing Bathing Assistance: Maximum assistance Feeding assistance: Independent Dressing Assistance: Maximum assistance     Functional Limitations Info  Sight, Hearing, Speech Sight Info: Adequate Hearing Info: Adequate Speech Info: Adequate    SPECIAL CARE FACTORS FREQUENCY  PT (By licensed PT), OT (By licensed OT)     PT Frequency: 5x/week OT Frequency: 5x/week            Contractures      Additional Factors Info  Code Status, Allergies, Psychotropic Code Status Info: Full Code Allergies Info: Tape Psychotropic Info: Paxil         Current Medications (06/06/2018):  This is the current hospital active medication list Current Facility-Administered Medications  Medication Dose Route Frequency Provider Last Rate Last Dose  . acetaminophen (TYLENOL) tablet 650 mg  650 mg Oral Q6H PRN Pearson Grippe, MD       Or  . acetaminophen (TYLENOL) suppository 650 mg  650 mg Rectal Q6H PRN Pearson Grippe, MD      . albuterol (PROVENTIL) (2.5 MG/3ML) 0.083% nebulizer solution 2.5 mg  2.5 mg Nebulization Q4H PRN Selena Batten,  Fayrene Fearing, MD      . amLODipine (NORVASC) tablet 5 mg  5 mg Oral Daily Pearson Grippe, MD   5 mg at 06/06/18 0948  . antiseptic oral rinse (BIOTENE) solution 15 mL  15 mL Mouth Rinse PRN Pearson Grippe, MD      . cholecalciferol (VITAMIN D) tablet 1,000 Units  1,000 Units Oral QPM Pearson Grippe, MD   1,000 Units at 06/05/18 1726  . diphenhydrAMINE (BENADRYL) injection 25 mg  25 mg Intravenous  Q6H PRN Pearson Grippe, MD      . doxazosin (CARDURA) tablet 8 mg  8 mg Oral Daily Pearson Grippe, MD   8 mg at 06/06/18 0949  . folic acid (FOLVITE) tablet 1 mg  1 mg Oral Daily Pearson Grippe, MD   1 mg at 06/06/18 0947  . ipratropium-albuterol (DUONEB) 0.5-2.5 (3) MG/3ML nebulizer solution 3 mL  3 mL Nebulization BID Marguerita Merles [place], DO   3 mL at 06/06/18 0826  . levothyroxine (SYNTHROID, LEVOTHROID) tablet 75 mcg  75 mcg Oral QAC breakfast Pearson Grippe, MD   75 mcg at 06/06/18 0948  . lisinopril (PRINIVIL,ZESTRIL) tablet 40 mg  40 mg Oral Daily Pearson Grippe, MD   40 mg at 06/06/18 0948  . metoprolol tartrate (LOPRESSOR) tablet 12.5 mg  12.5 mg Oral BID Pearson Grippe, MD   12.5 mg at 06/06/18 0947  . mirabegron ER (MYRBETRIQ) tablet 50 mg  50 mg Oral Daily Pearson Grippe, MD   50 mg at 06/06/18 0947  . multivitamin (PROSIGHT) tablet 1 tablet  1 tablet Oral Daily Pearson Grippe, MD   1 tablet at 06/06/18 0947  . ondansetron (ZOFRAN) injection 4 mg  4 mg Intravenous Q6H PRN Pearson Grippe, MD      . oxyCODONE-acetaminophen (PERCOCET/ROXICET) 5-325 MG per tablet 1 tablet  1 tablet Oral Q4H PRN Pearson Grippe, MD   1 tablet at 06/05/18 1307  . pantoprazole (PROTONIX) EC tablet 40 mg  40 mg Oral Daily Pearson Grippe, MD   40 mg at 06/06/18 0947  . PARoxetine (PAXIL) tablet 40 mg  40 mg Oral Daily Pearson Grippe, MD   40 mg at 06/06/18 0949  . polyvinyl alcohol (LIQUIFILM TEARS) 1.4 % ophthalmic solution 1 drop  1 drop Both Eyes Daily Pearson Grippe, MD   1 drop at 06/06/18 0948  . potassium chloride SA (K-DUR,KLOR-CON) CR tablet 40 mEq  40 mEq Oral Daily Marguerita Merles [place], DO   40 mEq at 06/06/18 0949  . Rivaroxaban (XARELTO) tablet 15 mg  15 mg Oral Q supper Pearson Grippe, MD   15 mg at 06/05/18 1726  . simvastatin (ZOCOR) tablet 5 mg  5 mg Oral QPM Pearson Grippe, MD   5 mg at 06/05/18 1726     Discharge Medications: Please see discharge summary for a list of discharge medications.  Relevant Imaging Results:  Relevant Lab  Results:   Additional Information SSN: [id number]  Antionette Poles, LCSW

## 2018-06-06 NOTE — Care Management Obs Status (Signed)
MEDICARE OBSERVATION STATUS NOTIFICATION   Patient Details  Name: Christina Ortiz MRN: [id number] Date of Birth: Aug 14, 1929   Medicare Observation Status Notification Given:  Yes    Geni Bers, RN 06/06/2018, 3:23 PM

## 2018-06-06 NOTE — Progress Notes (Signed)
CSW provided patient with bed offers. Patient selected [facility]. CSW spoke with [facility] admissions staff member Marylene Land, staff confirmed bed offer.   CSW contacted Horald Pollen to inquire about patient's insurance authorization status. Case Manager Rosey Bath reported that patient's authorization is #8 in que to be worked.   CSW updated patient and patient's daughter. Patient's daughter requested patient's insurance number reporting that she received a voicemail from [place]. Patient granted CSW verbal permission to give patient's daughter patient's [employer] Id number. CSW provided information to patient's daughter, patient's daughter reported that she was going to follow up with [facility].  CSW updated [facility].  CSw updated patient's attending MD.   CSW will continue to follow and assist with discharge planning.  Celso Sickle, [place] Clinical Social Worker [facility] Cell#: [phone]

## 2018-06-06 NOTE — Clinical Social Work Note (Signed)
Clinical Social Work Assessment  Patient Details  Name: Christina Ortiz MRN: [id number] Date of Birth: 27-Aug-1929  Date of referral:  06/06/18               Reason for consult:  Facility Placement                Permission sought to share information with:  Oceanographer granted to share information::  Yes, Verbal Permission Granted  Name::        Agency::     Relationship::     Contact Information:     Housing/Transportation Living arrangements for the past 2 months:  Single Family Home Source of Information:  Patient Patient Interpreter Needed:  None Criminal Activity/Legal Involvement Pertinent to Current Situation/Hospitalization:  No - Comment as needed Significant Relationships:  Adult Children Lives with:  Self Do you feel safe going back to the place where you live?  (PT recommending SNF) Need for family participation in patient care:  No (Coment)  Care giving concerns:  Patient from home alone. Patient reported that she was recently dc home from SNF with home health services. Patient reported that she is supposed to use a walker but doesn't. Patient reported that she is independent with ADLs. PT recommending SNF.   Social Worker assessment / plan:  CSW spoke with patient at bedside regarding PT recommendation for SNF. Patient reported that she would prefer to return home but that she is agreeable to SNF for ST rehab. CSW explained SNF placement process and insurance authorization, patient reported that she was recently discharged from [facility]. CSW agreed to complete patient's FL2 and follow up with bed offers.  CSW completed patient's FL2. CSW started patient's insurance authorization. CSW started patient's PASRR, under manual review MUSTID [id number].  CSW will continue to follow and assist with discharge planning.  Employment status:  Retired Database administrator PT Recommendations:  Skilled Nursing  Facility Information / Referral to community resources:  Skilled Nursing Facility  Patient/Family's Response to care:  Patient appreciative of CSW assistance with discharge planning.  Patient/Family's Understanding of and Emotional Response to Diagnosis, Current Treatment, and Prognosis:  Patient presented calm and verbalized understanding of treatment plan. Patient reported that she prefer to return home but she is agreeable to ST rehab at [facility]. Patient reported that she has been making progress with home health OT, noting she is now able to left her arm. Patient verbalized plan to dc to SNF for ST rehab before returning home.  Emotional Assessment Appearance:  Appears stated age Attitude/Demeanor/Rapport:  Engaged Affect (typically observed):  Appropriate, Calm Orientation:  Oriented to Self, Oriented to Situation, Oriented to Place, Oriented to  Time Alcohol / Substance use:  Not Applicable Psych involvement (Current and /or in the community):  No (Comment)  Discharge Needs  Concerns to be addressed:  Care Coordination Readmission within the last 30 days:  No Current discharge risk:  Lives alone Barriers to Discharge:  English as a second language teacher, Designer, jewellery ([place])   Antionette Poles, LCSW 06/06/2018, 10:25 AM

## 2018-06-07 DIAGNOSIS — M6389 Disorders of muscle in diseases classified elsewhere, multiple sites: Secondary | ICD-10-CM | POA: Diagnosis not present

## 2018-06-07 DIAGNOSIS — E785 Hyperlipidemia, unspecified: Secondary | ICD-10-CM | POA: Diagnosis not present

## 2018-06-07 DIAGNOSIS — R0609 Other forms of dyspnea: Secondary | ICD-10-CM | POA: Diagnosis not present

## 2018-06-07 DIAGNOSIS — R279 Unspecified lack of coordination: Secondary | ICD-10-CM | POA: Diagnosis not present

## 2018-06-07 DIAGNOSIS — D538 Other specified nutritional anemias: Secondary | ICD-10-CM | POA: Diagnosis not present

## 2018-06-07 DIAGNOSIS — S72112G Displaced fracture of greater trochanter of left femur, subsequent encounter for closed fracture with delayed healing: Secondary | ICD-10-CM | POA: Diagnosis not present

## 2018-06-07 DIAGNOSIS — J205 Acute bronchitis due to respiratory syncytial virus: Secondary | ICD-10-CM | POA: Diagnosis not present

## 2018-06-07 DIAGNOSIS — R278 Other lack of coordination: Secondary | ICD-10-CM | POA: Diagnosis not present

## 2018-06-07 DIAGNOSIS — Z743 Need for continuous supervision: Secondary | ICD-10-CM | POA: Diagnosis not present

## 2018-06-07 DIAGNOSIS — M6281 Muscle weakness (generalized): Secondary | ICD-10-CM | POA: Diagnosis not present

## 2018-06-07 DIAGNOSIS — I2583 Coronary atherosclerosis due to lipid rich plaque: Secondary | ICD-10-CM | POA: Diagnosis not present

## 2018-06-07 DIAGNOSIS — I129 Hypertensive chronic kidney disease with stage 1 through stage 4 chronic kidney disease, or unspecified chronic kidney disease: Secondary | ICD-10-CM | POA: Diagnosis not present

## 2018-06-07 DIAGNOSIS — S72112A Displaced fracture of greater trochanter of left femur, initial encounter for closed fracture: Secondary | ICD-10-CM | POA: Diagnosis not present

## 2018-06-07 DIAGNOSIS — M25512 Pain in left shoulder: Secondary | ICD-10-CM | POA: Diagnosis not present

## 2018-06-07 DIAGNOSIS — R2681 Unsteadiness on feet: Secondary | ICD-10-CM | POA: Diagnosis not present

## 2018-06-07 DIAGNOSIS — E038 Other specified hypothyroidism: Secondary | ICD-10-CM | POA: Diagnosis not present

## 2018-06-07 DIAGNOSIS — M25552 Pain in left hip: Secondary | ICD-10-CM | POA: Diagnosis not present

## 2018-06-07 DIAGNOSIS — K5909 Other constipation: Secondary | ICD-10-CM | POA: Diagnosis not present

## 2018-06-07 DIAGNOSIS — J449 Chronic obstructive pulmonary disease, unspecified: Secondary | ICD-10-CM | POA: Diagnosis not present

## 2018-06-07 DIAGNOSIS — W19XXXD Unspecified fall, subsequent encounter: Secondary | ICD-10-CM | POA: Diagnosis not present

## 2018-06-07 DIAGNOSIS — J9601 Acute respiratory failure with hypoxia: Secondary | ICD-10-CM | POA: Diagnosis not present

## 2018-06-07 DIAGNOSIS — R41841 Cognitive communication deficit: Secondary | ICD-10-CM | POA: Diagnosis not present

## 2018-06-07 DIAGNOSIS — I480 Paroxysmal atrial fibrillation: Secondary | ICD-10-CM | POA: Diagnosis not present

## 2018-06-07 DIAGNOSIS — R0902 Hypoxemia: Secondary | ICD-10-CM | POA: Diagnosis not present

## 2018-06-07 DIAGNOSIS — N183 Chronic kidney disease, stage 3 (moderate): Secondary | ICD-10-CM | POA: Diagnosis not present

## 2018-06-07 DIAGNOSIS — I10 Essential (primary) hypertension: Secondary | ICD-10-CM | POA: Diagnosis not present

## 2018-06-07 DIAGNOSIS — J90 Pleural effusion, not elsewhere classified: Secondary | ICD-10-CM | POA: Diagnosis not present

## 2018-06-07 DIAGNOSIS — M79601 Pain in right arm: Secondary | ICD-10-CM | POA: Diagnosis not present

## 2018-06-07 DIAGNOSIS — I2510 Atherosclerotic heart disease of native coronary artery without angina pectoris: Secondary | ICD-10-CM | POA: Diagnosis not present

## 2018-06-07 DIAGNOSIS — J690 Pneumonitis due to inhalation of food and vomit: Secondary | ICD-10-CM | POA: Diagnosis not present

## 2018-06-07 DIAGNOSIS — E039 Hypothyroidism, unspecified: Secondary | ICD-10-CM | POA: Diagnosis not present

## 2018-06-07 DIAGNOSIS — Z79899 Other long term (current) drug therapy: Secondary | ICD-10-CM | POA: Diagnosis not present

## 2018-06-07 DIAGNOSIS — Z7901 Long term (current) use of anticoagulants: Secondary | ICD-10-CM | POA: Diagnosis not present

## 2018-06-07 LAB — CBC WITH DIFFERENTIAL/PLATELET
BASOS ABS: 0 10*3/uL (ref 0.0–0.1)
BASOS PCT: 0 %
EOS ABS: 0.2 10*3/uL (ref 0.0–0.7)
EOS PCT: 3 %
HCT: 36.9 % (ref 36.0–46.0)
Hemoglobin: 12.1 g/dL (ref 12.0–15.0)
LYMPHS PCT: 12 %
Lymphs Abs: 1.1 10*3/uL (ref 0.7–4.0)
MCH: 32.6 pg (ref 26.0–34.0)
MCHC: 32.8 g/dL (ref 30.0–36.0)
MCV: 99.5 fL (ref 78.0–100.0)
Monocytes Absolute: 1 10*3/uL (ref 0.1–1.0)
Monocytes Relative: 11 %
Neutro Abs: 6.6 10*3/uL (ref 1.7–7.7)
Neutrophils Relative %: 74 %
PLATELETS: 196 10*3/uL (ref 150–400)
RBC: 3.71 MIL/uL — AB (ref 3.87–5.11)
RDW: 12.6 % (ref 11.5–15.5)
WBC: 8.9 10*3/uL (ref 4.0–10.5)

## 2018-06-07 LAB — COMPREHENSIVE METABOLIC PANEL
ALBUMIN: 3.2 g/dL — AB (ref 3.5–5.0)
ALT: 6 U/L — AB (ref 14–54)
AST: 17 U/L (ref 15–41)
Alkaline Phosphatase: 56 U/L (ref 38–126)
Anion gap: 8 (ref 5–15)
BUN: 14 mg/dL (ref 6–20)
CHLORIDE: 105 mmol/L (ref 101–111)
CO2: 29 mmol/L (ref 22–32)
CREATININE: 0.72 mg/dL (ref 0.44–1.00)
Calcium: 9.1 mg/dL (ref 8.9–10.3)
GFR calc Af Amer: >60 mL/min (ref >60)
GFR calc non Af Amer: >60 mL/min (ref >60)
GLUCOSE: 104 mg/dL — AB (ref 65–99)
POTASSIUM: 3.6 mmol/L (ref 3.5–5.1)
SODIUM: 142 mmol/L (ref 135–145)
Total Bilirubin: 1 mg/dL (ref 0.3–1.2)
Total Protein: 7 g/dL (ref 6.5–8.1)

## 2018-06-07 LAB — MAGNESIUM: Magnesium: 1.7 mg/dL (ref 1.7–2.4)

## 2018-06-07 LAB — PHOSPHORUS: Phosphorus: 3.4 mg/dL (ref 2.5–4.6)

## 2018-06-07 MED ORDER — METOPROLOL TARTRATE 25 MG PO TABS: 12.5000 mg | ORAL_TABLET | Freq: Two times a day (BID) | ORAL | 0 refills | Status: DC

## 2018-06-07 MED ORDER — ALBUTEROL SULFATE (2.5 MG/3ML) 0.083% IN NEBU: 2.5000 mg | INHALATION_SOLUTION | RESPIRATORY_TRACT | 12 refills | Status: DC | PRN

## 2018-06-07 MED ORDER — PROSIGHT PO TABS: 1.0000 | ORAL_TABLET | Freq: Every day | ORAL | 0 refills | Status: DC

## 2018-06-07 MED ORDER — IPRATROPIUM-ALBUTEROL 0.5-2.5 (3) MG/3ML IN SOLN: 3.0000 mL | Freq: Two times a day (BID) | RESPIRATORY_TRACT | 0 refills | Status: DC

## 2018-06-07 MED ORDER — ACETAMINOPHEN 325 MG PO TABS: 650.0000 mg | ORAL_TABLET | Freq: Four times a day (QID) | ORAL | 0 refills | Status: AC | PRN

## 2018-06-07 NOTE — Discharge Summary (Signed)
Physician Discharge Summary  Patient ID: Christina Ortiz MRN: [id number] DOB/AGE: Sep 06, 1929 84 y.o.  Admit date: 06/03/2018 Discharge date: 06/07/2018  Admission Diagnoses:  Discharge Diagnoses:  Principal Problem:   Closed displaced fracture of greater trochanter of left femur (HCC)   Hypoxia Active Problems:   PAF (paroxysmal atrial fibrillation) (HCC)   Coronary artery disease    Pain   Discharged Condition: stable  Hospital Course:  Patient is a very pleasant 84 year old female with a past medical history significant for hypertension, Hyperlipidemia,CKD stage III,ParoxysmalAtrialFibrillation, coronary artery disease,history of gastritis, history of depression, history of hypothyroidism.  Patient was admitted following a fall.CT scan of the pelvis showed a very minimally displaced fracture of the greater trochanter of the left proximal femur.  Patient was seen by the on-call orthopedic who recommended conservative management.  Patient has been optimized and will be discharged to skilled nursing facility with rehabilitation.   During the hospital stay, the patient developed hypoxia.  Patient was managed with nebs DuoNeb, flutter valve, incentive spirometer and imaging studies.  Hypoxia has resolved.    Acute Respiratory Failure with Hypoxia, improved -Possibly secondary to Fentanyl vs undiagnosed OSA -Added DuoNeb 3 mL BID -C/w Incentive Spirometry and Flutter Valve -Weaned off of O2 and doing well now  -CXR today showed Stable cardiomediastinal silhouette with mild cardiomegaly. Nopneumothorax. Stable small bilateral pleural effusions. No overt pulmonary edema. Mild bibasilar lung opacities, unchanged.  Closed minimally displaced fracture greater trochanter left femur -Seen on CT Scan -Orthopedics Consulted and Dr. Rennis Chris feels that clinically the fracture demonstrates healing with good motion and that it is a stable injury -No operative intervention at this time and  Patient to be Weightbearing as Tolerated with Walker -Follow up with Dr. Aundria Rud in [facility] in 2-3 weeks -PT/OT Evaluated and recommending SNF -Pain Control with oxycodone-acetaminophen 1 tablet p.o. every 4 hours as needed for moderate pain -Social worker consulted for assistance placement in skilled nursing facility -Patient has placement at [facility] but requiring English as a second language teacher.  Right Foot Pain -Obtained Right Foot Complete X-Ray and showed no There is no evidence of fracture or dislocation. Minimal arthritic changes of the IP joints and of the first MTP joint with slight bunion formation on the head of the first metatarsal.  Paroxysmal Atrial Fibrillation -C/w Metoprolol 12.5mg  po bid and Rivaroxaban 15 mg po Daily  -C/w Telemetry   CAD s/p Stenting to RCA and LAD -C/w Metoprolol 12.5 mg po BID, Lisinopril 40 mg po Daily, and Simvastatin 5mg  po qhs  HLD -C/w Simvastatin 5mg  po qhs  Essential Hypertension -C/w Quinapril 40mg  po Pharmacy Substitution Lisinopril 40 mg po Daily and c/w Amlodipine 5mg  po qday -C/w Metoprolol 12.5 mg po BID   Hypothyroidism -C/w Levothyroxine 75 micrograms po qday  Anxiety and Depression -C/w Paroxetine 40 mg po Daily  GERD -C/w PPI Pantoprazole 40 mg po Daily   Urinary Incontinence -C/w Doxazosin 8mg  po qhs and Mirabergon ER  50mg  po qday  Hypomagnesemia -Patient's magnesium level was 1.4 and improved to 2.0 this AM -Replete with IV mag sulfate 3 g yesterday -Continue to monitor and replete as necessary -Repeat magnesium level in a.m.   Macrocytic Anemia -MCV mildly elevated at 100.6 yesterday and is now 98.9 -Hemoglobin/hematocrit stable at 11.5/34.9 -> 11.7/36.0 -Continue to monitor for signs of diabetes -Repeat CBC in a.m.   Consults: orthopedic surgery  Discharge Exam: Blood pressure (!) 150/79, pulse 77, temperature 97.9 F (36.6 C), resp. rate 20, height 5\' 3"  (1.6 m), weight 68.6  kg (151 lb 3.8 oz), SpO2  96 %.   Disposition: Discharge disposition: 03-Skilled Nursing Facility   Discharge Instructions    Call MD for:   Complete by:  As directed    Call PCP and Orthopedics with worsening symptoms   Diet - low sodium heart healthy   Complete by:  As directed    Increase activity slowly   Complete by:  As directed      Allergies as of 06/07/2018      Reactions   Tape Other (See Comments)   Patient's skin is thin and will TEAR AND BRUISE EASILY      Medication List    STOP taking these medications   albuterol 108 (90 Base) MCG/ACT inhaler Commonly known as:  PROVENTIL HFA;VENTOLIN HFA Replaced by:  albuterol (2.5 MG/3ML) 0.083% nebulizer solution   amLODipine 5 MG tablet Commonly known as:  NORVASC   doxazosin 8 MG tablet Commonly known as:  CARDURA   MYRBETRIQ 50 MG Tb24 tablet Generic drug:  mirabegron ER   oxyCODONE-acetaminophen 5-325 MG tablet Commonly known as:  PERCOCET/ROXICET   PARoxetine 40 MG tablet Commonly known as:  PAXIL   polyethylene glycol packet Commonly known as:  MIRALAX / GLYCOLAX   PRESERVISION AREDS 2 PO Replaced by:  multivitamin Tabs tablet   thiamine 100 MG tablet     TAKE these medications   acetaminophen 325 MG tablet Commonly known as:  TYLENOL Take 2 tablets (650 mg total) by mouth every 6 (six) hours as needed for up to 7 days for mild pain (or Fever >/= 101).   albuterol (2.5 MG/3ML) 0.083% nebulizer solution Commonly known as:  PROVENTIL Take 3 mLs (2.5 mg total) by nebulization every 4 (four) hours as needed for wheezing or shortness of breath. Replaces:  albuterol 108 (90 Base) MCG/ACT inhaler   antiseptic oral rinse Liqd 15 mLs by Mouth Rinse route as needed for dry mouth.   cholecalciferol 1000 units tablet Commonly known as:  VITAMIN D Take 1,000 Units by mouth every evening.   folic acid 1 MG tablet Commonly known as:  FOLVITE Take 1 tablet (1 mg total) by mouth daily.   ipratropium-albuterol 0.5-2.5 (3) MG/3ML  Soln Commonly known as:  DUONEB Take 3 mLs by nebulization 2 (two) times daily.   levothyroxine 75 MCG tablet Commonly known as:  SYNTHROID, LEVOTHROID Take 1 tablet (75 mcg total) by mouth daily before breakfast.   metoprolol tartrate 25 MG tablet Commonly known as:  LOPRESSOR Take 0.5 tablets (12.5 mg total) by mouth 2 (two) times daily.   multivitamin Tabs tablet Take 1 tablet by mouth daily. Start taking on:  06/08/2018 Replaces:  PRESERVISION AREDS 2 PO   omeprazole 20 MG capsule Commonly known as:  PRILOSEC Take 1 capsule (20 mg total) by mouth daily as needed (heartburn).   potassium chloride 10 MEQ tablet Commonly known as:  K-DUR TAKE 1 TABLET BY MOUTH DAILY.   quinapril 40 MG tablet Commonly known as:  ACCUPRIL TAKE 1 TABLET (40 MG TOTAL) BY MOUTH DAILY.   simvastatin 5 MG tablet Commonly known as:  ZOCOR TAKE 1 TABLET BY MOUTH EVERY EVENING. What changed:  Another medication with the same name was removed. Continue taking this medication, and follow the directions you see here.   SYSTANE BALANCE 0.6 % Soln Generic drug:  Propylene Glycol Place 1 drop into both eyes daily.   XARELTO 15 MG Tabs tablet Generic drug:  Rivaroxaban TAKE 1 TABLET (15 MG TOTAL) BY  MOUTH DAILY WITH SUPPER.        SignedBarnetta Chapel 06/07/2018, 12:01 PM

## 2018-06-07 NOTE — Clinical Social Work Placement (Signed)
Patient received and accepted bed offer at [facility] SN[facility]. Facility aware of patient's discharge and confirmed bed offer. Patient received insurance authorization. PTAR contacted, patient aware and voicemail left for patient's daughter. Patient's RN can call report to [phone] Room 103A, packet complete. CSW signing off, no other needs identified at this time.  CLINICAL SOCIAL WORK PLACEMENT  NOTE  Date:  06/07/2018  Patient Details  Name: Christina Ortiz MRN: [id number] Date of Birth: [birth year]  Clinical Social Work is seeking post-discharge placement for this patient at the Skilled  Nursing Facility level of care (*CSW will initial, date and re-position this form in  chart as items are completed):  Yes   Patient/family provided with [facility] Clinical Social Work Department's list of facilities offering this level of care within the geographic area requested by the patient (or if unable, by the patient's family).  Yes   Patient/family informed of their freedom to choose among providers that offer the needed level of care, that participate in Medicare, Medicaid or managed care program needed by the patient, have an available bed and are willing to accept the patient.  Yes   Patient/family informed of 's ownership interest in [facility] and [facility], as well as of the fact that they are under no obligation to receive care at these facilities.  PASRR submitted to EDS on 06/06/18     PASRR number received on 06/06/18     Existing PASRR number confirmed on       FL2 transmitted to all facilities in geographic area requested by pt/family on 06/06/18     FL2 transmitted to all facilities within larger geographic area on       Patient informed that his/her managed care company has contracts with or will negotiate with certain facilities, including the following:        Yes   Patient/family informed of bed offers received.  Patient chooses bed at [facility]  [place]     Physician recommends and patient chooses bed at      Patient to be transferred to [facility] [place] on 06/07/18.  Patient to be transferred to facility by PTAR     Patient family notified on 06/07/18 of transfer.  Name of family member notified:  [name] (left voicemail)     PHYSICIAN       Additional Comment:    _______________________________________________ Antionette Poles, LCSW 06/07/2018, 1:58 PM

## 2018-06-10 DIAGNOSIS — K5909 Other constipation: Secondary | ICD-10-CM | POA: Diagnosis not present

## 2018-06-10 DIAGNOSIS — J449 Chronic obstructive pulmonary disease, unspecified: Secondary | ICD-10-CM | POA: Diagnosis not present

## 2018-06-10 DIAGNOSIS — D538 Other specified nutritional anemias: Secondary | ICD-10-CM | POA: Diagnosis not present

## 2018-06-10 DIAGNOSIS — I480 Paroxysmal atrial fibrillation: Secondary | ICD-10-CM | POA: Diagnosis not present

## 2018-06-10 DIAGNOSIS — S72112G Displaced fracture of greater trochanter of left femur, subsequent encounter for closed fracture with delayed healing: Secondary | ICD-10-CM | POA: Diagnosis not present

## 2018-06-10 DIAGNOSIS — E038 Other specified hypothyroidism: Secondary | ICD-10-CM | POA: Diagnosis not present

## 2018-06-10 DIAGNOSIS — I2510 Atherosclerotic heart disease of native coronary artery without angina pectoris: Secondary | ICD-10-CM | POA: Diagnosis not present

## 2018-06-15 ENCOUNTER — Ambulatory Visit: Payer: Medicare PPO | Admitting: Adult Health

## 2018-06-19 ENCOUNTER — Encounter: Payer: Self-pay | Admitting: Cardiology

## 2018-06-19 ENCOUNTER — Ambulatory Visit: Payer: Medicare PPO | Admitting: Cardiology

## 2018-06-19 VITALS — BP 126/90 | HR 72 | Ht 63.0 in | Wt 150.0 lb

## 2018-06-19 DIAGNOSIS — Z7901 Long term (current) use of anticoagulants: Secondary | ICD-10-CM

## 2018-06-19 DIAGNOSIS — Z79899 Other long term (current) drug therapy: Secondary | ICD-10-CM

## 2018-06-19 DIAGNOSIS — I10 Essential (primary) hypertension: Secondary | ICD-10-CM

## 2018-06-19 DIAGNOSIS — I2583 Coronary atherosclerosis due to lipid rich plaque: Secondary | ICD-10-CM

## 2018-06-19 DIAGNOSIS — I480 Paroxysmal atrial fibrillation: Secondary | ICD-10-CM

## 2018-06-19 DIAGNOSIS — I2510 Atherosclerotic heart disease of native coronary artery without angina pectoris: Secondary | ICD-10-CM

## 2018-06-19 NOTE — Patient Instructions (Addendum)

## 2018-06-19 NOTE — Progress Notes (Signed)
.     Cardiology Office Note   Date:  06/19/2018   ID:  Christina Ortiz, DOB 13-Dec-1929, MRN [id number]  PCP:  Julaine Fusi, NP  Cardiologist:   Donato Schultz, MD       History of Present Illness: Christina Ortiz is a 84 y.o. female who presents for her for follow-up history of CAD, 2 previous stents RCA and LAD paroxysmal atrial fibrillation.  She is moved from [facility]. I reviewed medical records from [facility] hospital, [facility]. She has a history of percutaneous intervention in 2006. Chronic smoker. No prior stroke or valve-like symptoms. LDL 05/28/14 was 70.  Echocardiogram on 01/22/14 showed moderate LVH, EF 55-60%, diastolic dysfunction, mild aortic stenosis, no pulmonary hypertension. Aortic valve area was 2.06 cm.  Prior EKG on 11/11/11 showed poor R-wave progression, low voltage. She is also had intermittent Mobitz type I second-degree AV block.  She had nuclear stress test on 11/15/11 that showed no definite evidence of ischemia or scar. Regional wall motion was normal.  Chest x-ray on 03/13/12 showed stable findings, hiatal hernia noted.  For her paroxysmal atrial fibrillation, she has been taking Xarelto 15 mg once a day. Metoprolol extended release 25 mg once a day, some he restarted.  Previously, she has had some challenges with control of her hypertension. Hopefully the Toprol 25 mg 1 help with this. We had to stop her amlodipine in the past because of hypotension. She continues to struggle at times with binge drinking alcohol. Unfortunately 2 months ago she lost her husband. She was partying quite a bit in [place] with her brother. She had an episode last weekend of binge drinking and felt her heart rate racing in the early morning after this.  Christina Ortiz daughter I take car of her.  10/26/17-she has had some diarrhea recently.  This may go along with a drinking binge.  No chest pain, no shortness of breath, no syncope, no bleeding.  No  melena.  06/19/18 - hip fx. BP may have been low so meds reduced.  Overall currently stable.  Blood pressures reviewed.  No chest pain.   Past Medical History:  Diagnosis Date  . Acute bronchitis 10/2017  . Arthritis   . CKD (chronic kidney disease)    Xarelto dose is 15 mg QD  . Coronary artery disease    a. s/p PCI/stenting to RCA and LAD ([facility] in [place]) in 2006; b. Nuc 1/15: No ischemia, EF 70%  . Depression   . Gastritis   . History of cholecystectomy   . History of echocardiogram    a. Echo 1/15: Moderate LVH, EF 55-60%, impaired relaxation, mild AS  . Hyperlipidemia   . Hypertension   . Hypokalemia   . Mobitz type 1 second degree atrioventricular block    a. Event Monitor 3/15: NSR, first-degree AV block, second-degree AB block - Mobitz 1, PACs, NSVT (5 beats)  . PAF (paroxysmal atrial fibrillation) (HCC)   . Thyroid disease     Past Surgical History:  Procedure Laterality Date  . BREAST LUMPECTOMY Left   . CARDIAC CATHETERIZATION    . CATARACT EXTRACTION Bilateral   . CHOLECYSTECTOMY    . DILATION AND CURETTAGE OF UTERUS    . EYE SURGERY       Current Outpatient Medications  Medication Sig Dispense Refill  . acetaminophen (TYLENOL) 325 MG tablet Take 650 mg by mouth every 6 (six) hours as needed.    Marland Kitchen albuterol (PROVENTIL) (2.5 MG/3ML) 0.083%  nebulizer solution Take 3 mLs (2.5 mg total) by nebulization every 4 (four) hours as needed for wheezing or shortness of breath. 75 mL 12  . antiseptic oral rinse (BIOTENE) LIQD 15 mLs by Mouth Rinse route as needed for dry mouth.    . cholecalciferol (VITAMIN D) 1000 units tablet Take 1,000 Units by mouth every evening.    . folic acid (FOLVITE) 1 MG tablet Take 1 tablet (1 mg total) by mouth daily. 30 tablet 0  . ipratropium-albuterol (DUONEB) 0.5-2.5 (3) MG/3ML SOLN Take 3 mLs by nebulization 2 (two) times daily. 360 mL 0  . levothyroxine (SYNTHROID, LEVOTHROID) 75 MCG tablet Take 1 tablet (75 mcg total) by mouth  daily before breakfast. 90 tablet 1  . magnesium oxide (MAG-OX) 400 MG tablet Take 400 mg by mouth daily.    . metoprolol tartrate (LOPRESSOR) 25 MG tablet Take 0.5 tablets (12.5 mg total) by mouth 2 (two) times daily. 60 tablet 0  . multivitamin (PROSIGHT) TABS tablet Take 1 tablet by mouth daily. 30 each 0  . omeprazole (PRILOSEC) 20 MG capsule Take 1 capsule (20 mg total) by mouth daily as needed (heartburn). 90 capsule 0  . polyethylene glycol (MIRALAX / GLYCOLAX) packet Take 17 g by mouth daily.    . potassium chloride (K-DUR) 10 MEQ tablet TAKE 1 TABLET BY MOUTH DAILY. 90 tablet 0  . Propylene Glycol (SYSTANE BALANCE) 0.6 % SOLN Place 1 drop into both eyes daily.    . quinapril (ACCUPRIL) 40 MG tablet TAKE 1 TABLET (40 MG TOTAL) BY MOUTH DAILY. 90 tablet 0  . simvastatin (ZOCOR) 5 MG tablet TAKE 1 TABLET BY MOUTH EVERY EVENING. 90 tablet 0  . traMADol (ULTRAM) 50 MG tablet Take 50 mg by mouth every 8 (eight) hours as needed.    [name] 15 MG TABS tablet TAKE 1 TABLET (15 MG TOTAL) BY MOUTH DAILY WITH SUPPER. 90 tablet 3   No current facility-administered medications for this visit.     Allergies:   Tape    Social History:  The patient  reports that she quit smoking about 2 years ago. Her smoking use included cigarettes. She has a 33.50 pack-year smoking history. She has never used smokeless tobacco. She reports that she drinks about 6.0 oz of alcohol per week. She reports that she does not use drugs.   Family History:  The patient's family history includes Cancer in her father; Heart attack in her mother; Heart disease in her mother; Lung cancer in her father. her mother had valvular heart disease age 82.   ROS:  Please see the history of present illness.   Otherwise, review of systems are positive for none.   All other systems are reviewed and negative.    PHYSICAL EXAM: VS:  BP 126/90   Pulse 72   Ht 5\' 3"  (1.6 m)   Wt 150 lb (68 kg)   SpO2 97%   BMI 26.57 kg/m  , BMI Body  mass index is 26.57 kg/m.  GEN: Well nourished, well developed, in no acute distress elderly, in wheelchair HEENT: normal  Neck: no JVD, carotid bruits, or masses Cardiac: RRR; 1/6 SM, no rubs, or gallops,no edema  Respiratory:  clear to auscultation bilaterally, normal work of breathing GI: soft, nontender, nondistended, + BS MS: no deformity or atrophy  Skin: warm and dry, no rash Neuro:  Alert and Oriented x 3, Strength and sensation are intact Psych: euthymic mood, full affect     EKG:  04/04/15-sinus  bradycardia rate 59 with old septal infarct pattern, poor R-wave progression area no significant change from prior EKG.   Recent Labs: 11/09/2017: B Natriuretic Peptide 392.7 01/24/2018: TSH 0.970 06/07/2018: ALT 6; BUN 14; Creatinine, Ser 0.72; Hemoglobin 12.1; Magnesium 1.7; Platelets 196; Potassium 3.6; Sodium 142    Lipid Panel    Component Value Date/Time   CHOL 132 01/24/2018 0930   TRIG 77 01/24/2018 0930   HDL 57 01/24/2018 0930   CHOLHDL 2.3 01/24/2018 0930   LDLCALC 60 01/24/2018 0930     LDL on 05/28/14 was 70, HDL 63, triglycerides 76    Wt Readings from Last 3 Encounters:  06/19/18 150 lb (68 kg)  06/07/18 151 lb 3.8 oz (68.6 kg)  05/05/18 158 lb (71.7 kg)      Other studies Reviewed: Additional studies/ records that were reviewed today include: Prior office records from [place] reviewed, lab work reviewed, stress test reviewed.. Review of the above records demonstrates: As above   ASSESSMENT AND PLAN:  1. Paroxysmal atrial fibrillation-she is currently on Xarelto 15 mg once a day. This was dose adjusted because of decreased creatinine clearance at one point. We will continue. She has not had any bleeding issues.  She has had several falls including a fall that resulted in hip fracture.  I have discontinued her aspirin 81 mg since she is taking Xarelto to help reduce overall bleeding risks for the future. She is asymptomatic with her atrial fibrillation.  Continue with current medications. She did think that an urgent care doctor told her that she was in atrial fibrillation after a weekend drinking binge with vodka. Overall asymptomatic.She thinks once again that she may have had atrial fibrillation with heart rate in the 120s after binge drinking.  Continue to encourage alcohol cessation.  We are worried about hypokalemia that can occur during drinking.  Overall currently in sinus rhythm currently.   2. Chronic anticoagulation-Xarelto 15 mg once a day. Every 6 months she should have a CBC and creatinine checked.These have been stable.  12.1 hemoglobin.  3. Coronary artery disease-prior LAD and RCA stent placed in 2006.  Prior nuclear stress test 2012 reassuring with no ischemia. No anginal symptoms. Stable.  No changes made.  4. Essential hypertension-1/2 dose Toprol 25 mg. This helps her with her blood pressure. I'm fine with this.  She has had 2 highly elevated readings however for the most part she is in respectable normal range.  No changes on ACE inhibitor quinapril 40 mg as well.  5. Former smoking-she has quit in 2015. She is maintaining tobacco cessation.  No changes.  She saw pulmonary, did not think that she had any evidence of COPD.  Occasionally she will have what looks like increased respiratory effort just sitting.  Oxygen saturation during this visit 97.  6. Hyperlipidemia-continue with low-dose simvastatin. Could not tolerate higher doses.  Continue with current plan.  No changes made.  No myalgias.   Current medicines are reviewed at length with the patient today.  The patient does not have concerns regarding medicines.  The following changes have been made: Checking blood work.  Labs/ tests ordered today include:   No orders of the defined types were placed in this encounter.  Disposition:   Six-month follow-up.  Signed, Donato Schultz, MD  06/19/2018 10:12 AM    [facility] [address] [place],  [place], [place]  [address] Phone: [phone]; Fax: [phone]

## 2018-06-20 DIAGNOSIS — M25552 Pain in left hip: Secondary | ICD-10-CM | POA: Diagnosis not present

## 2018-06-20 DIAGNOSIS — M79601 Pain in right arm: Secondary | ICD-10-CM | POA: Diagnosis not present

## 2018-06-20 DIAGNOSIS — M25512 Pain in left shoulder: Secondary | ICD-10-CM | POA: Diagnosis not present

## 2018-06-24 DIAGNOSIS — S72112D Displaced fracture of greater trochanter of left femur, subsequent encounter for closed fracture with routine healing: Secondary | ICD-10-CM | POA: Diagnosis not present

## 2018-06-24 DIAGNOSIS — K219 Gastro-esophageal reflux disease without esophagitis: Secondary | ICD-10-CM | POA: Diagnosis not present

## 2018-06-24 DIAGNOSIS — I2510 Atherosclerotic heart disease of native coronary artery without angina pectoris: Secondary | ICD-10-CM | POA: Diagnosis not present

## 2018-06-24 DIAGNOSIS — S42301D Unspecified fracture of shaft of humerus, right arm, subsequent encounter for fracture with routine healing: Secondary | ICD-10-CM | POA: Diagnosis not present

## 2018-06-24 DIAGNOSIS — I480 Paroxysmal atrial fibrillation: Secondary | ICD-10-CM | POA: Diagnosis not present

## 2018-06-24 DIAGNOSIS — F1010 Alcohol abuse, uncomplicated: Secondary | ICD-10-CM | POA: Diagnosis not present

## 2018-06-24 DIAGNOSIS — J449 Chronic obstructive pulmonary disease, unspecified: Secondary | ICD-10-CM | POA: Diagnosis not present

## 2018-06-24 DIAGNOSIS — I129 Hypertensive chronic kidney disease with stage 1 through stage 4 chronic kidney disease, or unspecified chronic kidney disease: Secondary | ICD-10-CM | POA: Diagnosis not present

## 2018-06-24 DIAGNOSIS — N182 Chronic kidney disease, stage 2 (mild): Secondary | ICD-10-CM | POA: Diagnosis not present

## 2018-07-05 ENCOUNTER — Encounter: Payer: Self-pay | Admitting: Adult Health

## 2018-07-05 ENCOUNTER — Ambulatory Visit (INDEPENDENT_AMBULATORY_CARE_PROVIDER_SITE_OTHER): Payer: Medicare PPO | Admitting: Adult Health

## 2018-07-05 ENCOUNTER — Telehealth: Payer: Self-pay | Admitting: Adult Health

## 2018-07-05 VITALS — BP 128/82 | HR 66 | Ht 63.0 in | Wt 151.6 lb

## 2018-07-05 DIAGNOSIS — R0600 Dyspnea, unspecified: Secondary | ICD-10-CM | POA: Diagnosis not present

## 2018-07-05 DIAGNOSIS — I10 Essential (primary) hypertension: Secondary | ICD-10-CM

## 2018-07-05 DIAGNOSIS — Z09 Encounter for follow-up examination after completed treatment for conditions other than malignant neoplasm: Secondary | ICD-10-CM

## 2018-07-05 DIAGNOSIS — Z0000 Encounter for general adult medical examination without abnormal findings: Secondary | ICD-10-CM

## 2018-07-05 DIAGNOSIS — W19XXXA Unspecified fall, initial encounter: Secondary | ICD-10-CM | POA: Diagnosis not present

## 2018-07-05 MED ORDER — QUINAPRIL HCL 40 MG PO TABS: 40.0000 mg | ORAL_TABLET | Freq: Every day | ORAL | 2 refills | Status: DC

## 2018-07-05 NOTE — Assessment & Plan Note (Signed)
BP stable 128/82, HR 66 Continue Metoprolol 25mg  - 1/2 tab BID Continue Quinapril 40mg  QD

## 2018-07-05 NOTE — Telephone Encounter (Signed)
Trey Paula with [facility] is following up on orders for patient. He can be reached at [phone]

## 2018-07-05 NOTE — Assessment & Plan Note (Signed)
Follow Fall Prevention Guidelines Utilize home health aides and start home PT to build stamina/strength

## 2018-07-05 NOTE — Progress Notes (Addendum)
Subjective:    Patient ID: Christina Ortiz, female    DOB: [birth year], 84 y.o.   MRN: [id number]  HPI:  Christina Ortiz is here for hospital follow-up, admission dates 06/03/18-06/07/18 She sustained fall and suffered closed displaced fracture of greater trochanter of left femur- orthopedics recommended conservative management She was d/c'd to SNF for several weeks and has been home now for >1.5 weeks She was seen at [facility]/Dr. Anne Fu on 06/19/18- only BP meds she is now on are Metoprolol 25mg  1/2 tab BID and Quinapril 40mg  QD Last CMP 06/07/18- stable, creat 0.72, GFR >60, Mg++ and Phos- WNL She has home health aide "Victorino Dike" that assists her at home 3 hrs/day. She is in the process of setting up home PT She denies currently drinking ETOH She denies drinking prior to last fall in early June 2019 She denies acute musculoskeletal pain and has not required f/u with Orthopedic She is planning a trip to [place] with her daughter and son-in-law Discussed that she should not smoke or be around second hand smoke Discussed that she should not drink ETOH She denies thoughts of harming herself/others. She continues to struggle with depression r/t to her husband's death Daughter "Christina Ortiz" at [facility] during OV Of Note- [name] mentioned that her mother "nearly choked to death at the nursing home while eating a piece of meat". Patient Care Team    Relationship Specialty Notifications Start End  William Hamburger D, NP PCP - General Family Medicine  03/30/17   Jake Bathe, MD Consulting Physician Cardiology  03/30/17   Mateo Flow, MD Consulting Physician Ophthalmology  03/30/17   [facility]    03/30/17   Chilton Greathouse, MD Consulting Physician Pulmonary Disease  04/27/18     Patient Active Problem List   Diagnosis Date Noted  . Pain   . Closed displaced fracture of greater trochanter of left femur (HCC) 06/04/2018  . Hypoxia 06/03/2018  . Hospital discharge follow-up  04/27/2018  . Fall 03/31/2018  . Humerus shaft fracture-right 03/31/2018  . Rhabdomyolysis 03/31/2018  . Hypothermia 03/31/2018  . Healthcare maintenance 01/17/2018  . Nocturia 01/17/2018  . Macrocytosis 11/22/2017  . Acute bronchitis due to respiratory syncytial virus (RSV) 11/09/2017  . Acute respiratory failure with hypoxia (HCC) 11/09/2017  . Hypertension 11/09/2017  . PAF (paroxysmal atrial fibrillation) (HCC) 11/09/2017  . Coronary artery disease 11/09/2017  . CKD (chronic kidney disease) stage 2, GFR 60-89 ml/min 11/09/2017  . Hypothyroidism 11/09/2017  . Dyspnea 11/08/2017  . Upper respiratory tract infection 11/08/2017  . Elevated serum creatinine 06/21/2017  . Edema of both ankles 06/21/2017  . Diarrhea 05/25/2017  . Hypokalemia 05/25/2017  . Alcohol abuse 05/25/2017  . Depression, recurrent (HCC) 03/30/2017  . Lumbago 03/30/2017  . Influenza with respiratory manifestation 01/22/2017  . Coronary artery disease due to lipid rich plaque 04/04/2015  . Essential hypertension 04/04/2015  . Hyperlipidemia 04/04/2015  . Former smoker 04/04/2015  . Chronic anticoagulation 04/04/2015     Past Medical History:  Diagnosis Date  . Acute bronchitis 10/2017  . Arthritis   . CKD (chronic kidney disease)    Xarelto dose is 15 mg QD  . Coronary artery disease    a. s/p PCI/stenting to RCA and LAD ([facility] in [place]) in 2006; b. Nuc 1/15: No ischemia, EF 70%  . Depression   . Gastritis   . Hip fracture (HCC)   . History of cholecystectomy   . History of echocardiogram    a. Echo  1/15: Moderate LVH, EF 55-60%, impaired relaxation, mild AS  . Hyperlipidemia   . Hypertension   . Hypokalemia   . Mobitz type 1 second degree atrioventricular block    a. Event Monitor 3/15: NSR, first-degree AV block, second-degree AB block - Mobitz 1, PACs, NSVT (5 beats)  . PAF (paroxysmal atrial fibrillation) (HCC)   . Thyroid disease      Past Surgical History:  Procedure  Laterality Date  . BREAST LUMPECTOMY Left   . CARDIAC CATHETERIZATION    . CATARACT EXTRACTION Bilateral   . CHOLECYSTECTOMY    . DILATION AND CURETTAGE OF UTERUS    . EYE SURGERY       Family History  Problem Relation Age of Onset  . Heart disease Mother   . Heart attack Mother   . Lung cancer Father   . Cancer Father        lung     Social History   Substance and Sexual Activity  Drug Use No     Social History   Substance and Sexual Activity  Alcohol Use Yes  . Alcohol/week: 6.0 oz  . Types: 10 Shots of liquor per week     Social History   Tobacco Use  Smoking Status Former Smoker  . Packs/day: 0.50  . Years: 67.00  . Pack years: 33.50  . Types: Cigarettes  . Last attempt to quit: 10/25/2015  . Years since quitting: 2.6  Smokeless Tobacco Never Used  Tobacco Comment   quit 2015     Outpatient Encounter Medications as of 07/05/2018  Medication Sig  . acetaminophen (TYLENOL) 325 MG tablet Take 650 mg by mouth every 6 (six) hours as needed.  Marland Kitchen albuterol (PROVENTIL) (2.5 MG/3ML) 0.083% nebulizer solution Take 3 mLs (2.5 mg total) by nebulization every 4 (four) hours as needed for wheezing or shortness of breath.  Marland Kitchen antiseptic oral rinse (BIOTENE) LIQD 15 mLs by Mouth Rinse route as needed for dry mouth.  . cholecalciferol (VITAMIN D) 1000 units tablet Take 1,000 Units by mouth every evening.  . folic acid (FOLVITE) 1 MG tablet Take 1 tablet (1 mg total) by mouth daily.  Marland Kitchen ipratropium-albuterol (DUONEB) 0.5-2.5 (3) MG/3ML SOLN Take 3 mLs by nebulization 2 (two) times daily.  Marland Kitchen levothyroxine (SYNTHROID, LEVOTHROID) 75 MCG tablet Take 1 tablet (75 mcg total) by mouth daily before breakfast.  . magnesium oxide (MAG-OX) 400 MG tablet Take 400 mg by mouth daily.  . metoprolol tartrate (LOPRESSOR) 25 MG tablet Take 0.5 tablets (12.5 mg total) by mouth 2 (two) times daily.  . multivitamin (PROSIGHT) TABS tablet Take 1 tablet by mouth daily.  Marland Kitchen omeprazole  (PRILOSEC) 20 MG capsule Take 1 capsule (20 mg total) by mouth daily as needed (heartburn).  . polyethylene glycol (MIRALAX / GLYCOLAX) packet Take 17 g by mouth daily.  . potassium chloride (K-DUR) 10 MEQ tablet TAKE 1 TABLET BY MOUTH DAILY.  Marland Kitchen Propylene Glycol (SYSTANE BALANCE) 0.6 % SOLN Place 1 drop into both eyes daily.  . quinapril (ACCUPRIL) 40 MG tablet Take 1 tablet (40 mg total) by mouth daily.  . simvastatin (ZOCOR) 5 MG tablet TAKE 1 TABLET BY MOUTH EVERY EVENING.  . traMADol (ULTRAM) 50 MG tablet Take 50 mg by mouth every 8 (eight) hours as needed.  [name] 15 MG TABS tablet TAKE 1 TABLET (15 MG TOTAL) BY MOUTH DAILY WITH SUPPER.  . [DISCONTINUED] quinapril (ACCUPRIL) 40 MG tablet TAKE 1 TABLET (40 MG TOTAL) BY MOUTH DAILY.  No facility-administered encounter medications on file as of 07/05/2018.     Allergies: Tape  Body mass index is 26.85 kg/m.  Blood pressure 128/82, pulse 66, height 5\' 3"  (1.6 m), weight 151 lb 9.6 oz (68.8 kg), SpO2 93 %.  Review of Systems  Constitutional: Positive for fatigue. Negative for activity change, appetite change, chills, diaphoresis, fever and unexpected weight change.  Respiratory: Negative for cough, chest tightness, shortness of breath, wheezing and stridor.   Cardiovascular: Positive for leg swelling. Negative for chest pain and palpitations.  Gastrointestinal: Negative for abdominal distention, abdominal pain, blood in stool, constipation, diarrhea, nausea and vomiting.  Endocrine: Negative for cold intolerance, heat intolerance, polydipsia, polyphagia and polyuria.  Genitourinary: Negative for difficulty urinating and flank pain.  Musculoskeletal: Positive for arthralgias, gait problem, joint swelling and myalgias.  Skin: Negative for color change, pallor, rash and wound.  Neurological: Negative for dizziness and headaches.  Hematological: Does not bruise/bleed easily.  Psychiatric/Behavioral: Negative for confusion, decreased  concentration, dysphoric mood, hallucinations, self-injury, sleep disturbance and suicidal ideas. The patient is not nervous/anxious and is not hyperactive.        Objective:   Physical Exam  Constitutional: She is oriented to person, place, and time. She appears well-developed and well-nourished. No distress.  HENT:  Head: Normocephalic and atraumatic.  Right Ear: External ear normal. Decreased hearing is noted.  Left Ear: External ear normal. Decreased hearing is noted.  Nose: Nose normal.  Mouth/Throat: Oropharynx is clear and moist.  Excessive cerumen buildup bilaterally  Ears flushed  Cardiovascular: Normal rate, regular rhythm and intact distal pulses.  Murmur heard. Pulmonary/Chest: Effort normal and breath sounds normal. No stridor. No respiratory distress. She has no wheezes. She has no rales. She exhibits no tenderness.  Musculoskeletal: She exhibits edema. She exhibits no tenderness.  Neurological: She is alert and oriented to person, place, and time.  Skin: Skin is warm and dry. Capillary refill takes less than 2 seconds. No rash noted. She is not diaphoretic. No erythema. There is pallor.  She is normally pale in complexion  Psychiatric: She has a normal mood and affect. Her behavior is normal. Judgment and thought content normal.  Nursing note and vitals reviewed.     Assessment & Plan:   1. Healthcare maintenance   2. Essential hypertension   3. Hospital discharge follow-up   4. Fall, initial encounter   5. Dyspnea, unspecified type     Hospital discharge follow-up Continue all current medications as directed. Start with home Physical Therapy ASAP. Please utilize home health aide "Victorino Dike" with house yard, etc- to avoid another fall. Do not use tobacco or even be around second hand exposure. Do not drink alcohol. Continue to drink plenty of water and follow Mediterranean diet. Follow up in 3-16months- Medicare Wellness with fasting labs. Enjoy your trip to  [place], [place].  Fall Follow Fall Prevention Guidelines Utilize home health aides and start home PT to build stamina/strength   Dyspnea Do not use tobacco or even be exposed to second hand smoke  Hypertension BP stable 128/82, HR 66 Continue Metoprolol 25mg  - 1/2 tab BID Continue Quinapril 40mg  QD    FOLLOW-UP:  Return in about 3 months (around 10/05/2018) for Fasting Labs, Medical Wellness.

## 2018-07-05 NOTE — Assessment & Plan Note (Signed)
Continue all current medications as directed. Start with home Physical Therapy ASAP. Please utilize home health aide "Victorino Dike" with house yard, etc- to avoid another fall. Do not use tobacco or even be around second hand exposure. Do not drink alcohol. Continue to drink plenty of water and follow Mediterranean diet. Follow up in 3-24months- Medicare Wellness with fasting labs. Enjoy your trip to [place], [place].

## 2018-07-05 NOTE — Patient Instructions (Addendum)
Mediterranean Diet A Mediterranean diet refers to food and lifestyle choices that are based on the traditions of countries located on the [place]. This way of eating has been shown to help prevent certain conditions and improve outcomes for people who have chronic diseases, like kidney disease and heart disease. What are tips for following this plan? Lifestyle  Cook and eat meals together with your family, when possible.  Drink enough fluid to keep your urine clear or pale yellow.  Be physically active every day. This includes: ? Aerobic exercise like running or swimming. ? Leisure activities like gardening, walking, or housework.  Get 7-8 hours of sleep each night.  If recommended by your health care provider, drink red wine in moderation. This means 1 glass a day for nonpregnant women and 2 glasses a day for men. A glass of wine equals 5 oz (150 mL). Reading food labels  Check the serving size of packaged foods. For foods such as rice and pasta, the serving size refers to the amount of cooked product, not dry.  Check the total fat in packaged foods. Avoid foods that have saturated fat or trans fats.  Check the ingredients list for added sugars, such as corn syrup. Shopping  At the grocery store, buy most of your food from the areas near the walls of the store. This includes: ? Fresh fruits and vegetables (produce). ? Grains, beans, nuts, and seeds. Some of these may be available in unpackaged forms or large amounts (in bulk). ? Fresh seafood. ? Poultry and eggs. ? Low-fat dairy products.  Buy whole ingredients instead of prepackaged foods.  Buy fresh fruits and vegetables in-season from local farmers markets.  Buy frozen fruits and vegetables in resealable bags.  If you do not have access to quality fresh seafood, buy precooked frozen shrimp or canned fish, such as tuna, salmon, or sardines.  Buy small amounts of raw or cooked vegetables, salads, or olives from the  deli or salad bar at your store.  Stock your pantry so you always have certain foods on hand, such as olive oil, canned tuna, canned tomatoes, rice, pasta, and beans. Cooking  Cook foods with extra-virgin olive oil instead of using butter or other vegetable oils.  Have meat as a side dish, and have vegetables or grains as your main dish. This means having meat in small portions or adding small amounts of meat to foods like pasta or stew.  Use beans or vegetables instead of meat in common dishes like chili or lasagna.  Experiment with different cooking methods. Try roasting or broiling vegetables instead of steaming or sauteing them.  Add frozen vegetables to soups, stews, pasta, or rice.  Add nuts or seeds for added healthy fat at each meal. You can add these to yogurt, salads, or vegetable dishes.  Marinate fish or vegetables using olive oil, lemon juice, garlic, and fresh herbs. Meal planning  Plan to eat 1 vegetarian meal one day each week. Try to work up to 2 vegetarian meals, if possible.  Eat seafood 2 or more times a week.  Have healthy snacks readily available, such as: ? Vegetable sticks with hummus. ? Greek yogurt. ? Fruit and nut trail mix.  Eat balanced meals throughout the week. This includes: ? Fruit: 2-3 servings a day ? Vegetables: 4-5 servings a day ? Low-fat dairy: 2 servings a day ? Fish, poultry, or lean meat: 1 serving a day ? Beans and legumes: 2 or more servings a week ? Nuts   and seeds: 1-2 servings a day ? Whole grains: 6-8 servings a day ? Extra-virgin olive oil: 3-4 servings a day  Limit red meat and sweets to only a few servings a month What are my food choices?  Mediterranean diet ? Recommended ? Grains: Whole-grain pasta. Brown rice. Bulgar wheat. Polenta. Couscous. Whole-wheat bread. [place]. ? Vegetables: Artichokes. Beets. Broccoli. Cabbage. Carrots. Eggplant. Green beans. Chard. Kale. Spinach. Onions. Leeks. Peas. Squash.  Tomatoes. Peppers. Radishes. ? Fruits: Apples. Apricots. Avocado. Berries. Bananas. Cherries. Dates. Figs. Grapes. Lemons. Melon. Oranges. Peaches. Plums. Pomegranate. ? Meats and other protein foods: Beans. Almonds. Sunflower seeds. Pine nuts. Peanuts. [place]. Salmon. Scall[place]. Shrimp. [place]. Tilapia. Clams. Oysters. Eggs. ? Dairy: Low-fat milk. Cheese. Greek yogurt. ? Beverages: Water. Red wine. Herbal tea. ? Fats and oils: Extra virgin olive oil. Avocado oil. Grape seed oil. ? Sweets and desserts: [place] yogurt with honey. Baked apples. Poached pears. Trail mix. ? Seasoning and other foods: Basil. Cilantro. Coriander. Cumin. Mint. Parsley. Sage. Rosemary. Tarragon. Garlic. Oregano. Thyme. Pepper. Balsalmic vinegar. Tahini. Hummus. Tomato sauce. Olives. Mushrooms. ? Limit these ? Grains: Prepackaged pasta or rice dishes. Prepackaged cereal with added sugar. ? Vegetables: Deep fried potatoes (french fries). ? Fruits: Fruit canned in syrup. ? Meats and other protein foods: Beef. Pork. Lamb. Poultry with skin. Hot dogs. [place] Salines. ? Dairy: Ice cream. Sour cream. Whole milk. ? Beverages: Juice. Sugar-sweetened soft drinks. Beer. Liquor and spirits. ? Fats and oils: Butter. Canola oil. Vegetable oil. Beef fat (tallow). Lard. ? Sweets and desserts: Cookies. Cakes. Pies. Candy. ? Seasoning and other foods: Mayonnaise. Premade sauces and marinades. ? The items listed may not be a complete list. Talk with your dietitian about what dietary choices are right for you. Summary  The Mediterranean diet includes both food and lifestyle choices.  Eat a variety of fresh fruits and vegetables, beans, nuts, seeds, and whole grains.  Limit the amount of red meat and sweets that you eat.  Talk with your health care provider about whether it is safe for you to drink red wine in moderation. This means 1 glass a day for nonpregnant women and 2 glasses a day for men. A glass of wine equals 5 oz (150 mL). This information  is not intended to replace advice given to you by your health care provider. Make sure you discuss any questions you have with your health care provider. Document Released: 08/05/2016 Document Revised: 09/07/2016 Document Reviewed: 08/05/2016 Elsevier Interactive Patient Education  2018 [employer] and [employer]ractures  Falls can be very serious, especially for older adults or people with osteoporosis  Falls can be caused by:  Tripping or slipping  Slow reflexes  Balance problems  Reduced muscle strength  Poor vision or a recent change in prescription  Illness and some medications (especially blood pressure pills, diuretics, heart medicines, muscle relaxants and sleep medications)  Drinking alcohol  To prevent falls outdoors:  Use a can or walker if needed  Wear rubber-soled shoes so you don't slip  DO NOT buy "shape up" shoes with rocker bottom soles if you have balance problems.  The thick soles and shape make it more difficult to keep your balance.  Put kitty litter or salt on icy sidewalks  Walk on the grass if the sidewalks are slick  Avoid walking on uneven ground whenever possible  T prevent falls indoors:  Keep rooms clutter-free, especially hallways, stairs and paths to light switches  Remove throw rugs  Install night lights,  especially to and in the bathroom  Turn on lights before going downstairs  Keep a flashlight next to your bed  Buy a cordless phone to keep with you instead of jumping up to answer the phone  Install grab bars in the bathroom near the shower and toilet  Install rails on both sides of the stairs.  Make sure the stairs are well lit  Wear slippers with non-skid soles.  Do not walk around in stockings or socks  Balance problems and dizziness are not a normal part of growing older.  If you begin having balance problems or dizziness see your doctor.  Physical Therapy can help you with many balance problems, strengthening  hip and leg muscles and with gait training.  To keep your bones healthy make sure you are getting enough calcium and Vitamin D each day.  Ask your doctor or pharmacist about supplements.  Regular weight-bearing exercise like walking, lifting weights or dancing can help strengthen bones and prevent osteoporosis.  Continue all current medications as directed. Start with home Physical Therapy ASAP. Please utilize home health aide "Victorino Dike" with house yard, etc- to avoid another fall. Do not use tobacco or even be around second hand exposure. Do not drink alcohol. Continue to drink plenty of water and follow Mediterranean diet. Follow up in 3-36months- Medicare Wellness with fasting labs. Enjoy your trip to [place], [place]. NICE TO SEE YOU!

## 2018-07-05 NOTE — Assessment & Plan Note (Signed)
Do not use tobacco or even be exposed to second hand smoke

## 2018-07-05 NOTE — Telephone Encounter (Signed)
Christina Ortiz that orders were faxed on 07/04/18.  Tiajuana Amass, CMA

## 2018-07-12 DIAGNOSIS — K219 Gastro-esophageal reflux disease without esophagitis: Secondary | ICD-10-CM | POA: Diagnosis not present

## 2018-07-12 DIAGNOSIS — I480 Paroxysmal atrial fibrillation: Secondary | ICD-10-CM | POA: Diagnosis not present

## 2018-07-12 DIAGNOSIS — F1010 Alcohol abuse, uncomplicated: Secondary | ICD-10-CM | POA: Diagnosis not present

## 2018-07-12 DIAGNOSIS — S72112D Displaced fracture of greater trochanter of left femur, subsequent encounter for closed fracture with routine healing: Secondary | ICD-10-CM | POA: Diagnosis not present

## 2018-07-12 DIAGNOSIS — I129 Hypertensive chronic kidney disease with stage 1 through stage 4 chronic kidney disease, or unspecified chronic kidney disease: Secondary | ICD-10-CM | POA: Diagnosis not present

## 2018-07-12 DIAGNOSIS — I2510 Atherosclerotic heart disease of native coronary artery without angina pectoris: Secondary | ICD-10-CM | POA: Diagnosis not present

## 2018-07-12 DIAGNOSIS — J449 Chronic obstructive pulmonary disease, unspecified: Secondary | ICD-10-CM | POA: Diagnosis not present

## 2018-07-12 DIAGNOSIS — N182 Chronic kidney disease, stage 2 (mild): Secondary | ICD-10-CM | POA: Diagnosis not present

## 2018-07-12 DIAGNOSIS — S42301D Unspecified fracture of shaft of humerus, right arm, subsequent encounter for fracture with routine healing: Secondary | ICD-10-CM | POA: Diagnosis not present

## 2018-07-14 DIAGNOSIS — K219 Gastro-esophageal reflux disease without esophagitis: Secondary | ICD-10-CM | POA: Diagnosis not present

## 2018-07-14 DIAGNOSIS — S42301D Unspecified fracture of shaft of humerus, right arm, subsequent encounter for fracture with routine healing: Secondary | ICD-10-CM | POA: Diagnosis not present

## 2018-07-14 DIAGNOSIS — I2510 Atherosclerotic heart disease of native coronary artery without angina pectoris: Secondary | ICD-10-CM | POA: Diagnosis not present

## 2018-07-14 DIAGNOSIS — N182 Chronic kidney disease, stage 2 (mild): Secondary | ICD-10-CM | POA: Diagnosis not present

## 2018-07-14 DIAGNOSIS — I129 Hypertensive chronic kidney disease with stage 1 through stage 4 chronic kidney disease, or unspecified chronic kidney disease: Secondary | ICD-10-CM | POA: Diagnosis not present

## 2018-07-14 DIAGNOSIS — F1010 Alcohol abuse, uncomplicated: Secondary | ICD-10-CM | POA: Diagnosis not present

## 2018-07-14 DIAGNOSIS — I480 Paroxysmal atrial fibrillation: Secondary | ICD-10-CM | POA: Diagnosis not present

## 2018-07-14 DIAGNOSIS — J449 Chronic obstructive pulmonary disease, unspecified: Secondary | ICD-10-CM | POA: Diagnosis not present

## 2018-07-14 DIAGNOSIS — S72112D Displaced fracture of greater trochanter of left femur, subsequent encounter for closed fracture with routine healing: Secondary | ICD-10-CM | POA: Diagnosis not present

## 2018-07-17 DIAGNOSIS — I2510 Atherosclerotic heart disease of native coronary artery without angina pectoris: Secondary | ICD-10-CM | POA: Diagnosis not present

## 2018-07-17 DIAGNOSIS — F1010 Alcohol abuse, uncomplicated: Secondary | ICD-10-CM | POA: Diagnosis not present

## 2018-07-17 DIAGNOSIS — S72112D Displaced fracture of greater trochanter of left femur, subsequent encounter for closed fracture with routine healing: Secondary | ICD-10-CM | POA: Diagnosis not present

## 2018-07-17 DIAGNOSIS — S42301D Unspecified fracture of shaft of humerus, right arm, subsequent encounter for fracture with routine healing: Secondary | ICD-10-CM | POA: Diagnosis not present

## 2018-07-17 DIAGNOSIS — J449 Chronic obstructive pulmonary disease, unspecified: Secondary | ICD-10-CM | POA: Diagnosis not present

## 2018-07-17 DIAGNOSIS — N182 Chronic kidney disease, stage 2 (mild): Secondary | ICD-10-CM | POA: Diagnosis not present

## 2018-07-17 DIAGNOSIS — I129 Hypertensive chronic kidney disease with stage 1 through stage 4 chronic kidney disease, or unspecified chronic kidney disease: Secondary | ICD-10-CM | POA: Diagnosis not present

## 2018-07-17 DIAGNOSIS — K219 Gastro-esophageal reflux disease without esophagitis: Secondary | ICD-10-CM | POA: Diagnosis not present

## 2018-07-17 DIAGNOSIS — I480 Paroxysmal atrial fibrillation: Secondary | ICD-10-CM | POA: Diagnosis not present

## 2018-07-18 ENCOUNTER — Encounter: Payer: Medicare PPO | Admitting: Adult Health

## 2018-07-25 DIAGNOSIS — H353132 Nonexudative age-related macular degeneration, bilateral, intermediate dry stage: Secondary | ICD-10-CM | POA: Diagnosis not present

## 2018-07-25 DIAGNOSIS — H35033 Hypertensive retinopathy, bilateral: Secondary | ICD-10-CM | POA: Diagnosis not present

## 2018-07-25 DIAGNOSIS — Z961 Presence of intraocular lens: Secondary | ICD-10-CM | POA: Diagnosis not present

## 2018-07-25 DIAGNOSIS — H3560 Retinal hemorrhage, unspecified eye: Secondary | ICD-10-CM | POA: Diagnosis not present

## 2018-07-27 DIAGNOSIS — I480 Paroxysmal atrial fibrillation: Secondary | ICD-10-CM | POA: Diagnosis not present

## 2018-07-27 DIAGNOSIS — F1010 Alcohol abuse, uncomplicated: Secondary | ICD-10-CM | POA: Diagnosis not present

## 2018-07-27 DIAGNOSIS — N182 Chronic kidney disease, stage 2 (mild): Secondary | ICD-10-CM | POA: Diagnosis not present

## 2018-07-27 DIAGNOSIS — S72112D Displaced fracture of greater trochanter of left femur, subsequent encounter for closed fracture with routine healing: Secondary | ICD-10-CM | POA: Diagnosis not present

## 2018-07-27 DIAGNOSIS — I2510 Atherosclerotic heart disease of native coronary artery without angina pectoris: Secondary | ICD-10-CM | POA: Diagnosis not present

## 2018-07-27 DIAGNOSIS — I129 Hypertensive chronic kidney disease with stage 1 through stage 4 chronic kidney disease, or unspecified chronic kidney disease: Secondary | ICD-10-CM | POA: Diagnosis not present

## 2018-07-27 DIAGNOSIS — K219 Gastro-esophageal reflux disease without esophagitis: Secondary | ICD-10-CM | POA: Diagnosis not present

## 2018-07-27 DIAGNOSIS — S42301D Unspecified fracture of shaft of humerus, right arm, subsequent encounter for fracture with routine healing: Secondary | ICD-10-CM | POA: Diagnosis not present

## 2018-07-27 DIAGNOSIS — J449 Chronic obstructive pulmonary disease, unspecified: Secondary | ICD-10-CM | POA: Diagnosis not present

## 2018-08-01 DIAGNOSIS — I129 Hypertensive chronic kidney disease with stage 1 through stage 4 chronic kidney disease, or unspecified chronic kidney disease: Secondary | ICD-10-CM | POA: Diagnosis not present

## 2018-08-01 DIAGNOSIS — S72112D Displaced fracture of greater trochanter of left femur, subsequent encounter for closed fracture with routine healing: Secondary | ICD-10-CM | POA: Diagnosis not present

## 2018-08-01 DIAGNOSIS — S42301D Unspecified fracture of shaft of humerus, right arm, subsequent encounter for fracture with routine healing: Secondary | ICD-10-CM | POA: Diagnosis not present

## 2018-08-01 DIAGNOSIS — K219 Gastro-esophageal reflux disease without esophagitis: Secondary | ICD-10-CM | POA: Diagnosis not present

## 2018-08-01 DIAGNOSIS — I480 Paroxysmal atrial fibrillation: Secondary | ICD-10-CM | POA: Diagnosis not present

## 2018-08-01 DIAGNOSIS — I2510 Atherosclerotic heart disease of native coronary artery without angina pectoris: Secondary | ICD-10-CM | POA: Diagnosis not present

## 2018-08-01 DIAGNOSIS — F1010 Alcohol abuse, uncomplicated: Secondary | ICD-10-CM | POA: Diagnosis not present

## 2018-08-01 DIAGNOSIS — N182 Chronic kidney disease, stage 2 (mild): Secondary | ICD-10-CM | POA: Diagnosis not present

## 2018-08-01 DIAGNOSIS — J449 Chronic obstructive pulmonary disease, unspecified: Secondary | ICD-10-CM | POA: Diagnosis not present

## 2018-08-02 DIAGNOSIS — N182 Chronic kidney disease, stage 2 (mild): Secondary | ICD-10-CM | POA: Diagnosis not present

## 2018-08-02 DIAGNOSIS — I480 Paroxysmal atrial fibrillation: Secondary | ICD-10-CM | POA: Diagnosis not present

## 2018-08-02 DIAGNOSIS — I129 Hypertensive chronic kidney disease with stage 1 through stage 4 chronic kidney disease, or unspecified chronic kidney disease: Secondary | ICD-10-CM | POA: Diagnosis not present

## 2018-08-02 DIAGNOSIS — J449 Chronic obstructive pulmonary disease, unspecified: Secondary | ICD-10-CM | POA: Diagnosis not present

## 2018-08-02 DIAGNOSIS — S42301D Unspecified fracture of shaft of humerus, right arm, subsequent encounter for fracture with routine healing: Secondary | ICD-10-CM | POA: Diagnosis not present

## 2018-08-02 DIAGNOSIS — K219 Gastro-esophageal reflux disease without esophagitis: Secondary | ICD-10-CM | POA: Diagnosis not present

## 2018-08-02 DIAGNOSIS — S72112D Displaced fracture of greater trochanter of left femur, subsequent encounter for closed fracture with routine healing: Secondary | ICD-10-CM | POA: Diagnosis not present

## 2018-08-02 DIAGNOSIS — I2510 Atherosclerotic heart disease of native coronary artery without angina pectoris: Secondary | ICD-10-CM | POA: Diagnosis not present

## 2018-08-02 DIAGNOSIS — F1010 Alcohol abuse, uncomplicated: Secondary | ICD-10-CM | POA: Diagnosis not present

## 2018-08-03 ENCOUNTER — Other Ambulatory Visit: Payer: Self-pay | Admitting: Adult Health

## 2018-08-08 DIAGNOSIS — I480 Paroxysmal atrial fibrillation: Secondary | ICD-10-CM | POA: Diagnosis not present

## 2018-08-08 DIAGNOSIS — F1010 Alcohol abuse, uncomplicated: Secondary | ICD-10-CM | POA: Diagnosis not present

## 2018-08-08 DIAGNOSIS — S72112D Displaced fracture of greater trochanter of left femur, subsequent encounter for closed fracture with routine healing: Secondary | ICD-10-CM | POA: Diagnosis not present

## 2018-08-08 DIAGNOSIS — K219 Gastro-esophageal reflux disease without esophagitis: Secondary | ICD-10-CM | POA: Diagnosis not present

## 2018-08-08 DIAGNOSIS — J449 Chronic obstructive pulmonary disease, unspecified: Secondary | ICD-10-CM | POA: Diagnosis not present

## 2018-08-08 DIAGNOSIS — N182 Chronic kidney disease, stage 2 (mild): Secondary | ICD-10-CM | POA: Diagnosis not present

## 2018-08-08 DIAGNOSIS — I2510 Atherosclerotic heart disease of native coronary artery without angina pectoris: Secondary | ICD-10-CM | POA: Diagnosis not present

## 2018-08-08 DIAGNOSIS — S42301D Unspecified fracture of shaft of humerus, right arm, subsequent encounter for fracture with routine healing: Secondary | ICD-10-CM | POA: Diagnosis not present

## 2018-08-08 DIAGNOSIS — I129 Hypertensive chronic kidney disease with stage 1 through stage 4 chronic kidney disease, or unspecified chronic kidney disease: Secondary | ICD-10-CM | POA: Diagnosis not present

## 2018-08-11 ENCOUNTER — Other Ambulatory Visit: Payer: Self-pay | Admitting: Adult Health

## 2018-09-05 NOTE — Progress Notes (Signed)
Subjective:    Patient ID: Christina Ortiz, female    DOB: 10-27-1929, 84 y.o.   MRN: [id number]  HPI:  Christina Ortiz presents with several elevated DBP at home, she estimates "3 or so" readings >90 diastolically over the last two weeks. She reports home SBP 120-140s and HR 60-70s Her antihypertensives have been steadily reduced the last 6 months due to hypotension. She is currently on Metoprolol 25mg  1/2 tab BID and Quinapril 40mg  QD She has lost 11 lbs in the last 2 months- unintentionally. She reports having 2 vodka cocktails 2-3 times/week, no vodka kept in her house. She denies CP/dyspnea/dizziness/HA/palpitations She reports "feeling steady on my feet". She denies unusual bleeding/bruising  Patient Care Team    Relationship Specialty Notifications Start End  William Hamburger D, NP PCP - General Family Medicine  03/30/17   Jake Bathe, MD Consulting Physician Cardiology  03/30/17   Mateo Flow, MD Consulting Physician Ophthalmology  03/30/17   [facility]    03/30/17   Chilton Greathouse, MD Consulting Physician Pulmonary Disease  04/27/18     Patient Active Problem List   Diagnosis Date Noted  . Dysphagia 09/06/2018  . Pain   . Closed displaced fracture of greater trochanter of left femur (HCC) 06/04/2018  . Hypoxia 06/03/2018  . Hospital discharge follow-up 04/27/2018  . Fall 03/31/2018  . Humerus shaft fracture-right 03/31/2018  . Rhabdomyolysis 03/31/2018  . Hypothermia 03/31/2018  . Healthcare maintenance 01/17/2018  . Nocturia 01/17/2018  . Macrocytosis 11/22/2017  . Acute bronchitis due to respiratory syncytial virus (RSV) 11/09/2017  . Acute respiratory failure with hypoxia (HCC) 11/09/2017  . Hypertension 11/09/2017  . PAF (paroxysmal atrial fibrillation) (HCC) 11/09/2017  . Coronary artery disease 11/09/2017  . CKD (chronic kidney disease) stage 2, GFR 60-89 ml/min 11/09/2017  . Hypothyroidism 11/09/2017  . Dyspnea 11/08/2017  .  Upper respiratory tract infection 11/08/2017  . Elevated serum creatinine 06/21/2017  . Edema of both ankles 06/21/2017  . Diarrhea 05/25/2017  . Hypokalemia 05/25/2017  . Alcohol abuse 05/25/2017  . Depression, recurrent (HCC) 03/30/2017  . Lumbago 03/30/2017  . Influenza with respiratory manifestation 01/22/2017  . Coronary artery disease due to lipid rich plaque 04/04/2015  . Essential hypertension 04/04/2015  . Hyperlipidemia 04/04/2015  . Former smoker 04/04/2015  . Chronic anticoagulation 04/04/2015     Past Medical History:  Diagnosis Date  . Acute bronchitis 10/2017  . Arthritis   . CKD (chronic kidney disease)    Xarelto dose is 15 mg QD  . Coronary artery disease    a. s/p PCI/stenting to RCA and LAD ([facility] in [place]) in 2006; b. Nuc 1/15: No ischemia, EF 70%  . Depression   . Gastritis   . Hip fracture (HCC)   . History of cholecystectomy   . History of echocardiogram    a. Echo 1/15: Moderate LVH, EF 55-60%, impaired relaxation, mild AS  . Hyperlipidemia   . Hypertension   . Hypokalemia   . Mobitz type 1 second degree atrioventricular block    a. Event Monitor 3/15: NSR, first-degree AV block, second-degree AB block - Mobitz 1, PACs, NSVT (5 beats)  . PAF (paroxysmal atrial fibrillation) (HCC)   . Thyroid disease      Past Surgical History:  Procedure Laterality Date  . BREAST LUMPECTOMY Left   . CARDIAC CATHETERIZATION    . CATARACT EXTRACTION Bilateral   . CHOLECYSTECTOMY    . DILATION AND CURETTAGE OF UTERUS    .  EYE SURGERY       Family History  Problem Relation Age of Onset  . Heart disease Mother   . Heart attack Mother   . Lung cancer Father   . Cancer Father        lung     Social History   Substance and Sexual Activity  Drug Use No     Social History   Substance and Sexual Activity  Alcohol Use Yes  . Alcohol/week: 10.0 standard drinks  . Types: 10 Shots of liquor per week     Social History   Tobacco Use   Smoking Status Former Smoker  . Packs/day: 0.50  . Years: 67.00  . Pack years: 33.50  . Types: Cigarettes  . Last attempt to quit: 10/25/2015  . Years since quitting: 2.8  Smokeless Tobacco Never Used  Tobacco Comment   quit 2015     Outpatient Encounter Medications as of 09/06/2018  Medication Sig  . acetaminophen (TYLENOL) 325 MG tablet Take 650 mg by mouth every 6 (six) hours as needed.  Marland Kitchen antiseptic oral rinse (BIOTENE) LIQD 15 mLs by Mouth Rinse route as needed for dry mouth.  . cholecalciferol (VITAMIN D) 1000 units tablet Take 1,000 Units by mouth every evening.  Marland Kitchen levothyroxine (SYNTHROID, LEVOTHROID) 75 MCG tablet Take 1 tablet (75 mcg total) by mouth daily before breakfast.  . metoprolol tartrate (LOPRESSOR) 25 MG tablet TAKE 1/2 TABLETS (12.5 MG TOTAL) BY MOUTH 2 (TWO) TIMES DAILY.  . multivitamin (PROSIGHT) TABS tablet Take 1 tablet by mouth daily.  Marland Kitchen MYRBETRIQ 50 MG TB24 tablet Take 1 tablet by mouth daily.  Marland Kitchen PARoxetine (PAXIL) 40 MG tablet Take 40 mg by mouth daily.  . polyethylene glycol (MIRALAX / GLYCOLAX) packet Take 17 g by mouth daily.  . potassium chloride (K-DUR) 10 MEQ tablet Take 1 tablet (10 mEq total) by mouth daily.  Marland Kitchen Propylene Glycol (SYSTANE BALANCE) 0.6 % SOLN Place 1 drop into both eyes daily.  . quinapril (ACCUPRIL) 40 MG tablet Take 1 tablet (40 mg total) by mouth daily.  . simvastatin (ZOCOR) 5 MG tablet TAKE 1 TABLET BY MOUTH EVERY EVENING.  . traMADol (ULTRAM) 50 MG tablet Take 50 mg by mouth every 8 (eight) hours as needed.  Christina Ortiz 15 MG TABS tablet TAKE 1 TABLET (15 MG TOTAL) BY MOUTH DAILY WITH SUPPER.  . [DISCONTINUED] potassium chloride (K-DUR) 10 MEQ tablet TAKE 1 TABLET BY MOUTH DAILY.  . folic acid (FOLVITE) 1 MG tablet Take 1 tablet (1 mg total) by mouth daily.  . [DISCONTINUED] albuterol (PROVENTIL) (2.5 MG/3ML) 0.083% nebulizer solution Take 3 mLs (2.5 mg total) by nebulization every 4 (four) hours as needed for wheezing or  shortness of breath.  . [DISCONTINUED] folic acid (FOLVITE) 1 MG tablet Take 1 tablet (1 mg total) by mouth daily.  . [DISCONTINUED] ipratropium-albuterol (DUONEB) 0.5-2.5 (3) MG/3ML SOLN Take 3 mLs by nebulization 2 (two) times daily.  . [DISCONTINUED] magnesium oxide (MAG-OX) 400 MG tablet Take 400 mg by mouth daily.  . [DISCONTINUED] metoprolol tartrate (LOPRESSOR) 25 MG tablet Take 0.5 tablets (12.5 mg total) by mouth 2 (two) times daily.  . [DISCONTINUED] omeprazole (PRILOSEC) 20 MG capsule Take 1 capsule (20 mg total) by mouth daily as needed (heartburn).   No facility-administered encounter medications on file as of 09/06/2018.     Allergies: Tape  Body mass index is 24.96 kg/m.  Blood pressure 128/71, pulse 62, height 5\' 3"  (1.6 m), weight 140 lb 14.4 oz (  63.9 kg), SpO2 95 %.  Review of Systems  Constitutional: Positive for fatigue. Negative for activity change, appetite change, chills, diaphoresis, fever and unexpected weight change.  Eyes: Negative for visual disturbance.  Respiratory: Negative for cough, chest tightness, shortness of breath, wheezing and stridor.   Cardiovascular: Negative for chest pain, palpitations and leg swelling.  Gastrointestinal: Negative for abdominal distention, abdominal pain, blood in stool, constipation, diarrhea, nausea and vomiting.       Increase in frequency of dysphagia   Endocrine: Negative for cold intolerance, heat intolerance, polydipsia, polyphagia and polyuria.  Genitourinary: Negative for difficulty urinating and flank pain.  Musculoskeletal: Positive for arthralgias, back pain, joint swelling, myalgias, neck pain and neck stiffness.  Neurological: Negative for dizziness and headaches.  Hematological: Does not bruise/bleed easily.       Objective:   Physical Exam  Constitutional: She is oriented to person, place, and time. She appears well-developed and well-nourished. No distress.  HENT:  Head: Normocephalic and atraumatic.   Right Ear: External ear normal.  Left Ear: External ear normal.  Nose: Nose normal.  Mouth/Throat: Oropharynx is clear and moist.  Eyes: Pupils are equal, round, and reactive to light. Conjunctivae and EOM are normal.  Cardiovascular: Normal rate, regular rhythm and intact distal pulses.  Murmur heard. Pulmonary/Chest: Effort normal and breath sounds normal. No stridor. No respiratory distress. She has no wheezes. She has no rales. She exhibits no tenderness.  Neurological: She is alert and oriented to person, place, and time.  Skin: Skin is warm. Capillary refill takes less than 2 seconds. No rash noted. She is not diaphoretic. No erythema. There is pallor.  Pale complexion is her baseline  Psychiatric: She has a normal mood and affect. Her behavior is normal. Judgment and thought content normal.  Nursing note and vitals reviewed.     Assessment & Plan:   1. Healthcare maintenance   2. Hypokalemia   3. Elevated serum creatinine   4. Dysphagia, unspecified type   5. Essential hypertension     Dysphagia Recurrent dysphagia Referral to GI placed  Elevated serum creatinine CMP drawn  Healthcare maintenance Continue all medications as directed. Continue to check blood pressure and heart rate daily. If the top number (systolic) consistently runs >140 or under <100- call Cardiology. If the bottom number (diastolic) consistently runs >95 or under <60- call Cardiology. If your heart rate either consistently <60 ot >100- call Cardiology. Continue to stay well hydrated and follow Mediterranean diet. Continue to keep alcohol intake to max 2 drinks/day only 1-2 days/week. Referral to Gastroenterologist placed, someone will call you to schedule an appt. We will call you when lab results are available. Follow-up 6 months, for a physical and labs.  Hypokalemia CMP drawn Currently on KDUR 10 meq QD  Essential hypertension BP at goal 128/71 Continue Metoprolol 25mg  1/2 BID and  Quinapril 40mg  QD Continue to check blood pressure and heart rate daily. If the top number (systolic) consistently runs >140 or under <100- call Cardiology. If the bottom number (diastolic) consistently runs >95 or under <60- call Cardiology. If your heart rate either consistently <60 ot >100- call Cardiology.    FOLLOW-UP:  Return in about 6 months (around 03/07/2019) for CPE.

## 2018-09-06 ENCOUNTER — Ambulatory Visit (INDEPENDENT_AMBULATORY_CARE_PROVIDER_SITE_OTHER): Payer: Medicare PPO | Admitting: Adult Health

## 2018-09-06 ENCOUNTER — Encounter: Payer: Self-pay | Admitting: Adult Health

## 2018-09-06 ENCOUNTER — Other Ambulatory Visit: Payer: Self-pay | Admitting: Adult Health

## 2018-09-06 VITALS — BP 128/71 | HR 62 | Ht 63.0 in | Wt 140.9 lb

## 2018-09-06 DIAGNOSIS — E876 Hypokalemia: Secondary | ICD-10-CM

## 2018-09-06 DIAGNOSIS — R7989 Other specified abnormal findings of blood chemistry: Secondary | ICD-10-CM | POA: Diagnosis not present

## 2018-09-06 DIAGNOSIS — I10 Essential (primary) hypertension: Secondary | ICD-10-CM | POA: Diagnosis not present

## 2018-09-06 DIAGNOSIS — R1310 Dysphagia, unspecified: Secondary | ICD-10-CM | POA: Insufficient documentation

## 2018-09-06 DIAGNOSIS — Z0000 Encounter for general adult medical examination without abnormal findings: Secondary | ICD-10-CM | POA: Diagnosis not present

## 2018-09-06 MED ORDER — FOLIC ACID 1 MG PO TABS
1.0000 mg | ORAL_TABLET | Freq: Every day | ORAL | 3 refills | Status: DC
Start: 2018-09-06 — End: 2019-09-04

## 2018-09-06 MED ORDER — POTASSIUM CHLORIDE ER 10 MEQ PO TBCR: 10.0000 meq | EXTENDED_RELEASE_TABLET | Freq: Every day | ORAL | 2 refills | Status: DC

## 2018-09-06 NOTE — Assessment & Plan Note (Signed)
CMP drawn Currently on KDUR 10 meq QD

## 2018-09-06 NOTE — Assessment & Plan Note (Signed)
Continue all medications as directed. Continue to check blood pressure and heart rate daily. If the top number (systolic) consistently runs >140 or under <100- call Cardiology. If the bottom number (diastolic) consistently runs >95 or under <60- call Cardiology. If your heart rate either consistently <60 ot >100- call Cardiology. Continue to stay well hydrated and follow Mediterranean diet. Continue to keep alcohol intake to max 2 drinks/day only 1-2 days/week. Referral to Gastroenterologist placed, someone will call you to schedule an appt. We will call you when lab results are available. Follow-up 6 months, for a physical and labs.

## 2018-09-06 NOTE — Addendum Note (Signed)
Addended by: Stan Head on: 09/06/2018 02:41 PM   Modules accepted: Orders

## 2018-09-06 NOTE — Assessment & Plan Note (Signed)
CMP drawn 

## 2018-09-06 NOTE — Patient Instructions (Addendum)

## 2018-09-06 NOTE — Assessment & Plan Note (Signed)
BP at goal 128/71 Continue Metoprolol 25mg  1/2 BID and Quinapril 40mg  QD Continue to check blood pressure and heart rate daily. If the top number (systolic) consistently runs >140 or under <100- call Cardiology. If the bottom number (diastolic) consistently runs >95 or under <60- call Cardiology. If your heart rate either consistently <60 ot >100- call Cardiology.

## 2018-09-06 NOTE — Assessment & Plan Note (Signed)
Recurrent dysphagia Referral to GI placed

## 2018-09-07 ENCOUNTER — Other Ambulatory Visit (INDEPENDENT_AMBULATORY_CARE_PROVIDER_SITE_OTHER): Payer: Medicare PPO

## 2018-09-07 ENCOUNTER — Encounter: Payer: Self-pay | Admitting: Gastroenterology

## 2018-09-07 DIAGNOSIS — E876 Hypokalemia: Secondary | ICD-10-CM | POA: Diagnosis not present

## 2018-09-07 DIAGNOSIS — R1310 Dysphagia, unspecified: Secondary | ICD-10-CM | POA: Diagnosis not present

## 2018-09-07 DIAGNOSIS — Z0000 Encounter for general adult medical examination without abnormal findings: Secondary | ICD-10-CM

## 2018-09-07 DIAGNOSIS — R7989 Other specified abnormal findings of blood chemistry: Secondary | ICD-10-CM

## 2018-09-07 DIAGNOSIS — I10 Essential (primary) hypertension: Secondary | ICD-10-CM | POA: Diagnosis not present

## 2018-09-08 LAB — COMPREHENSIVE METABOLIC PANEL
A/G RATIO: 1.3 (ref 1.2–2.2)
ALK PHOS: 79 IU/L (ref 39–117)
ALT: 5 IU/L (ref 0–32)
AST: 24 IU/L (ref 0–40)
Albumin: 3.9 g/dL (ref 3.5–4.7)
BILIRUBIN TOTAL: 0.6 mg/dL (ref 0.0–1.2)
BUN/Creatinine Ratio: 21 (ref 12–28)
BUN: 18 mg/dL (ref 8–27)
CHLORIDE: 100 mmol/L (ref 96–106)
CO2: 24 mmol/L (ref 20–29)
Calcium: 9.2 mg/dL (ref 8.7–10.3)
Creatinine, Ser: 0.86 mg/dL (ref 0.57–1.00)
GFR calc Af Amer: 71 mL/min/{1.73_m2} (ref >59)
GFR calc non Af Amer: 61 mL/min/{1.73_m2} (ref >59)
GLOBULIN, TOTAL: 3.1 g/dL (ref 1.5–4.5)
Glucose: 112 mg/dL — ABNORMAL HIGH (ref 65–99)
POTASSIUM: 4 mmol/L (ref 3.5–5.2)
SODIUM: 143 mmol/L (ref 134–144)
Total Protein: 7 g/dL (ref 6.0–8.5)

## 2018-09-08 LAB — MAGNESIUM: MAGNESIUM: 1.3 mg/dL — AB (ref 1.6–2.3)

## 2018-09-12 DIAGNOSIS — Z1231 Encounter for screening mammogram for malignant neoplasm of breast: Secondary | ICD-10-CM | POA: Diagnosis not present

## 2018-09-12 LAB — HM MAMMOGRAPHY

## 2018-09-23 ENCOUNTER — Other Ambulatory Visit: Payer: Self-pay | Admitting: Adult Health

## 2018-09-27 ENCOUNTER — Other Ambulatory Visit: Payer: Self-pay | Admitting: Adult Health

## 2018-09-27 NOTE — Telephone Encounter (Signed)
We have not prescribed these medications for the patient previously.  Please review and refill if appropriate.  T. Deziray Nabi, CMA  

## 2018-10-10 ENCOUNTER — Encounter: Payer: Self-pay | Admitting: Gastroenterology

## 2018-10-10 ENCOUNTER — Ambulatory Visit: Payer: Medicare PPO | Admitting: Gastroenterology

## 2018-10-10 VITALS — BP 130/70 | HR 82 | Ht 60.75 in | Wt 142.4 lb

## 2018-10-10 DIAGNOSIS — Z7901 Long term (current) use of anticoagulants: Secondary | ICD-10-CM

## 2018-10-10 DIAGNOSIS — R1310 Dysphagia, unspecified: Secondary | ICD-10-CM

## 2018-10-10 NOTE — Patient Instructions (Signed)
Take your omeprazole 20 mg daily and not as needed.   You have been scheduled for a Barium Esophogram at [facility] on 10/16/18 at 9:00am. Please arrive 15 minutes prior to your appointment for registration. Make certain not to have anything to eat or drink 3 hours prior to your test. If you need to reschedule for any reason, please contact radiology at [phone] to do so. __________________________________________________________________ A barium swallow is an examination that concentrates on views of the esophagus. This tends to be a double contrast exam (barium and two liquids which, when combined, create a gas to distend the wall of the oesophagus) or single contrast (non-ionic iodine based). The study is usually tailored to your symptoms so a good history is essential. Attention is paid during the study to the form, structure and configuration of the esophagus, looking for functional disorders (such as aspiration, dysphagia, achalasia, motility and reflux) EXAMINATION You may be asked to change into a gown, depending on the type of swallow being performed. A radiologist and radiographer will perform the procedure. The radiologist will advise you of the type of contrast selected for your procedure and direct you during the exam. You will be asked to stand, sit or lie in several different positions and to hold a small amount of fluid in your mouth before being asked to swallow while the imaging is performed .In some instances you may be asked to swallow barium coated marshmallows to assess the motility of a solid food bolus. The exam can be recorded as a digital or video fluoroscopy procedure. POST PROCEDURE It will take 1-2 days for the barium to pass through your system. To facilitate this, it is important, unless otherwise directed, to increase your fluids for the next 24-48hrs and to resume your normal diet.  This test typically takes about 30 minutes to  perform. ________________________________________________________________________  Thank you for choosing me and  Gastroenterology.  Venita Lick. Pleas Koch., MD., Clementeen Graham

## 2018-10-10 NOTE — Progress Notes (Signed)
History of Present Illness: This is an 84 year old female referred by Christina Fusi, NP for the evaluation of dysphagia.  She is accompanied by her daughter.  She has multiple comorbidities as outlined in PMH and is maintained on Xarelto.  She reports that Dr. Adron Bene in [place], [place] performed EGD, colonoscopy likely greater than 5 years ago.  She relates intermittent problems with dysphagia over the years.  Intermittently she notes difficulty swallowing bread although the symptoms have not progressed in severity.  She does not recall any findings on prior EGD or colonoscopy.  She does not recall any specific underlying GI diagnoses except she has occasional reflux and treats with omeprazole 20 mg as needed.  She suffered a hip fracture and was hospitalized in June 2019 with a subsequent rehab stay.  She suffered a right humeral fracture in April 2019 and was hospitalized.  Denies weight loss, abdominal pain, constipation, diarrhea, change in stool caliber, melena, hematochezia, nausea, vomiting, chest pain.     Allergies  Allergen Reactions  . Tape Other (See Comments)    Patient's skin is thin and will TEAR AND BRUISE EASILY   Outpatient Medications Prior to Visit  Medication Sig Dispense Refill  . acetaminophen (TYLENOL) 325 MG tablet Take 650 mg by mouth every 6 (six) hours as needed.    Marland Kitchen antiseptic oral rinse (BIOTENE) LIQD 15 mLs by Mouth Rinse route as needed for dry mouth.    . cholecalciferol (VITAMIN D) 1000 units tablet Take 1,000 Units by mouth every evening.    . folic acid (FOLVITE) 1 MG tablet Take 1 tablet (1 mg total) by mouth daily. 90 tablet 3  . ipratropium-albuterol (DUONEB) 0.5-2.5 (3) MG/3ML SOLN Take 3 mLs by nebulization 2 (two) times daily.    Marland Kitchen levothyroxine (SYNTHROID, LEVOTHROID) 75 MCG tablet TAKE 1 TABLET BY MOUTH DAILY BEFORE BREAKFAST. 90 tablet 1  . magnesium oxide (MAG-OX) 400 MG tablet Take 400 mg by mouth daily.    . metoprolol tartrate  (LOPRESSOR) 25 MG tablet TAKE 1/2 TABLETS (12.5 MG TOTAL) BY MOUTH 2 (TWO) TIMES DAILY. 90 tablet 0  . multivitamin (PROSIGHT) TABS tablet Take 1 tablet by mouth daily. 30 each 0  . MYRBETRIQ 50 MG TB24 tablet Take 1 tablet by mouth daily.  11  . omeprazole (PRILOSEC) 20 MG capsule Take 20 mg by mouth daily as needed.    Marland Kitchen PARoxetine (PAXIL) 40 MG tablet TAKE 1 TABLET BY MOUTH DAILY 90 tablet 0  . polyethylene glycol (MIRALAX / GLYCOLAX) packet Take 17 g by mouth daily.    . potassium chloride (K-DUR) 10 MEQ tablet Take 1 tablet (10 mEq total) by mouth daily. 90 tablet 2  . Propylene Glycol (SYSTANE BALANCE) 0.6 % SOLN Place 1 drop into both eyes daily.    . quinapril (ACCUPRIL) 40 MG tablet Take 1 tablet (40 mg total) by mouth daily. 90 tablet 2  . simvastatin (ZOCOR) 5 MG tablet TAKE 1 TABLET BY MOUTH EVERY EVENING. 90 tablet 1  . traMADol (ULTRAM) 50 MG tablet Take 50 mg by mouth every 8 (eight) hours as needed.    [name] 15 MG TABS tablet TAKE 1 TABLET (15 MG TOTAL) BY MOUTH DAILY WITH SUPPER. 90 tablet 3  . PARoxetine (PAXIL) 40 MG tablet Take 40 mg by mouth daily.     No facility-administered medications prior to visit.    Past Medical History:  Diagnosis Date  . Acute bronchitis 10/2017  . Arthritis   .  CKD (chronic kidney disease)    Xarelto dose is 15 mg QD  . Coronary artery disease    a. s/p PCI/stenting to RCA and LAD ([facility] in [place]) in 2006; b. Nuc 1/15: No ischemia, EF 70%  . Depression   . Gallstones   . Gastritis   . Hip fracture (HCC)   . History of cholecystectomy   . History of echocardiogram    a. Echo 1/15: Moderate LVH, EF 55-60%, impaired relaxation, mild AS  . Hyperlipidemia   . Hypertension   . Hypokalemia   . Mobitz type 1 second degree atrioventricular block    a. Event Monitor 3/15: NSR, first-degree AV block, second-degree AB block - Mobitz 1, PACs, NSVT (5 beats)  . PAF (paroxysmal atrial fibrillation) (HCC)   . Thyroid disease     Past Surgical History:  Procedure Laterality Date  . ANGIOPLASTY     with sent placement  . BLEPHAROPLASTY    . BREAST LUMPECTOMY Left   . CARDIAC CATHETERIZATION    . CATARACT EXTRACTION Bilateral   . CHOLECYSTECTOMY    . DILATION AND CURETTAGE OF UTERUS     Social History   Socioeconomic History  . Marital status: Widowed    Spouse name: Not on file  . Number of children: 1  . Years of education: Not on file  . Highest education level: Not on file  Occupational History  . Occupation: retired  Engineer, production  . Financial resource strain: Not on file  . Food insecurity:    Worry: Not on file    Inability: Not on file  . Transportation needs:    Medical: Not on file    Non-medical: Not on file  Tobacco Use  . Smoking status: Former Smoker    Packs/day: 0.50    Years: 67.00    Pack years: 33.50    Types: Cigarettes    Last attempt to quit: 10/25/2015    Years since quitting: 2.9  . Smokeless tobacco: Never Used  . Tobacco comment: quit 2015  Substance and Sexual Activity  . Alcohol use: Yes    Alcohol/week: 10.0 standard drinks    Types: 10 Shots of liquor per week    Comment: 7 a week  . Drug use: No  . Sexual activity: Never  Lifestyle  . Physical activity:    Days per week: Not on file    Minutes per session: Not on file  . Stress: Not on file  Relationships  . Social connections:    Talks on phone: Not on file    Gets together: Not on file    Attends religious service: Not on file    Active member of club or organization: Not on file    Attends meetings of clubs or organizations: Not on file    Relationship status: Not on file  Other Topics Concern  . Not on file  Social History Narrative  . Not on file   Family History  Problem Relation Age of Onset  . Heart disease Mother   . Heart attack Mother   . Lung cancer Father   . Lymphoma Brother   . Heart failure Brother       Review of Systems: Pertinent positive and negative review of systems  were noted in the above HPI section. All other review of systems were otherwise negative.    Physical Exam: General: Well developed, well nourished, elderly, no acute distress Head: Normocephalic and atraumatic Eyes:  sclerae anicteric, EOMI  Ears: Normal auditory acuity Mouth: No deformity or lesions Neck: Supple, no masses or thyromegaly Lungs: Clear throughout to auscultation Heart: Regular rate and rhythm; no murmurs, rubs or bruits Abdomen: Soft, non tender and non distended. No masses, hepatosplenomegaly or hernias noted. Normal Bowel sounds Rectal: Not done Musculoskeletal: Symmetrical with no gross deformities  Skin: No lesions on visible extremities Pulses:  Normal pulses noted Extremities: No clubbing, cyanosis, edema or deformities noted Neurological: Alert oriented x 4, grossly nonfocal Cervical Nodes:  No significant cervical adenopathy Inguinal Nodes: No significant inguinal adenopathy Psychological:  Alert and cooperative. Normal mood and affect   Assessment and Recommendations:  1. Dysphagia intermitent, non progressive.  GERD.  Rule out esophageal stricture, motility disorder, GERD induced symptoms.  Advised to take omeprazole 20 mg daily for the next 2 months and assess response.  Follow standard antireflux measures.  Minimize alcohol usage.  Schedule barium esophagram with tablet.  Given comorbidities would like to avoid procedures and sedation if possible.  Pending findings may need EGD to further evaluate or dilate.  2.  PAF.  Chronic Xarelto.    cc: Christina Fusi, NP [address] [place], [place] [address]

## 2018-10-16 ENCOUNTER — Ambulatory Visit (HOSPITAL_COMMUNITY): Admission: RE | Admit: 2018-10-16 | Payer: Medicare PPO | Source: Ambulatory Visit

## 2018-10-18 ENCOUNTER — Ambulatory Visit (HOSPITAL_COMMUNITY)
Admission: RE | Admit: 2018-10-18 | Discharge: 2018-10-18 | Disposition: A | Discharge status: Home / Self Care | Payer: 59 | Source: Ambulatory Visit | Attending: Gastroenterology | Admitting: Gastroenterology

## 2018-10-18 ENCOUNTER — Telehealth: Payer: Self-pay

## 2018-10-18 DIAGNOSIS — R1310 Dysphagia, unspecified: Secondary | ICD-10-CM | POA: Insufficient documentation

## 2018-10-18 DIAGNOSIS — K224 Dyskinesia of esophagus: Secondary | ICD-10-CM | POA: Insufficient documentation

## 2018-10-18 DIAGNOSIS — K222 Esophageal obstruction: Secondary | ICD-10-CM | POA: Diagnosis not present

## 2018-10-18 DIAGNOSIS — Z7901 Long term (current) use of anticoagulants: Secondary | ICD-10-CM | POA: Diagnosis not present

## 2018-10-18 DIAGNOSIS — K219 Gastro-esophageal reflux disease without esophagitis: Secondary | ICD-10-CM | POA: Diagnosis not present

## 2018-10-18 DIAGNOSIS — K449 Diaphragmatic hernia without obstruction or gangrene: Secondary | ICD-10-CM | POA: Diagnosis not present

## 2018-10-18 NOTE — Telephone Encounter (Signed)
[facility] Pre-operative Risk Assessment     Request for surgical clearance:     Endoscopy Procedure  What type of surgery is being performed?     EGD with esophageal dilation  When is this surgery scheduled?     TBT  What type of clearance is required ?   Pharmacy  Are there any medications that need to be held prior to surgery and how long? [place] name and name of physician performing surgery?      [facility]  What is your office phone and fax number?      Phone- [phone]  [phone]  Anesthesia type (None, local, MAC, general) ?       MAC

## 2018-10-18 NOTE — Telephone Encounter (Signed)
Pt takes Xarelto for afib with CHADS2VASc score of 5 (age x2, sex, HTN, CAD). Renal function is normal. Ok to hold Xarelto for 1-2 days prior to procedure.

## 2018-10-27 ENCOUNTER — Ambulatory Visit (AMBULATORY_SURGERY_CENTER): Payer: Self-pay | Admitting: *Deleted

## 2018-10-27 VITALS — Ht 60.0 in | Wt 145.0 lb

## 2018-10-27 DIAGNOSIS — R1310 Dysphagia, unspecified: Secondary | ICD-10-CM

## 2018-10-27 NOTE — Progress Notes (Signed)
Patient denies any allergies to eggs or soy. Patient denies any problems with anesthesia/sedation. Patient denies any oxygen use at home. Patient denies taking any diet/weight loss medications. Pt will hold Xarelto 2 days prior to exam.

## 2018-11-01 ENCOUNTER — Ambulatory Visit (AMBULATORY_SURGERY_CENTER): Payer: Medicare PPO | Admitting: Gastroenterology

## 2018-11-01 ENCOUNTER — Encounter: Payer: Self-pay | Admitting: Gastroenterology

## 2018-11-01 VITALS — BP 126/75 | HR 54 | Temp 97.5°F | Resp 26 | Ht 60.0 in | Wt 145.0 lb

## 2018-11-01 DIAGNOSIS — K449 Diaphragmatic hernia without obstruction or gangrene: Secondary | ICD-10-CM

## 2018-11-01 DIAGNOSIS — I2510 Atherosclerotic heart disease of native coronary artery without angina pectoris: Secondary | ICD-10-CM | POA: Diagnosis not present

## 2018-11-01 DIAGNOSIS — K2960 Other gastritis without bleeding: Secondary | ICD-10-CM

## 2018-11-01 DIAGNOSIS — R1310 Dysphagia, unspecified: Secondary | ICD-10-CM

## 2018-11-01 DIAGNOSIS — K3189 Other diseases of stomach and duodenum: Secondary | ICD-10-CM

## 2018-11-01 DIAGNOSIS — K222 Esophageal obstruction: Secondary | ICD-10-CM | POA: Diagnosis not present

## 2018-11-01 DIAGNOSIS — I10 Essential (primary) hypertension: Secondary | ICD-10-CM | POA: Diagnosis not present

## 2018-11-01 DIAGNOSIS — K5710 Diverticulosis of small intestine without perforation or abscess without bleeding: Secondary | ICD-10-CM | POA: Diagnosis not present

## 2018-11-01 DIAGNOSIS — R933 Abnormal findings on diagnostic imaging of other parts of digestive tract: Secondary | ICD-10-CM

## 2018-11-01 DIAGNOSIS — K319 Disease of stomach and duodenum, unspecified: Secondary | ICD-10-CM | POA: Diagnosis not present

## 2018-11-01 MED ORDER — SODIUM CHLORIDE 0.9 % IV SOLN: 500.0000 mL | Freq: Once | INTRAVENOUS | Status: DC

## 2018-11-01 NOTE — Progress Notes (Signed)
To recovery, report to RN, VSS. 

## 2018-11-01 NOTE — Progress Notes (Signed)
Called to room to assist during endoscopic procedure.  Patient ID and intended procedure confirmed with present staff. Received instructions for my participation in the procedure from the performing physician.  

## 2018-11-01 NOTE — Op Note (Signed)
[facility] Patient Name: Christina Ortiz Procedure Date: 11/01/2018 8:05 AM MRN: [id number] Endoscopist: [name] , MD Age: 84 Referring MD:  Date of Birth: 12/05/1929 Gender: Female Account #: [id number] Procedure:                Upper GI endoscopy Indications:              Dysphagia, Abnormal barium esophagram Medicines:                Monitored Anesthesia Care Procedure:                Pre-Anesthesia Assessment:                           - Prior to the procedure, a History and Physical                            was performed, and patient medications and                            allergies were reviewed. The patient's tolerance of                            previous anesthesia was also reviewed. The risks                            and benefits of the procedure and the sedation                            options and risks were discussed with the patient.                            All questions were answered, and informed consent                            was obtained. Prior Anticoagulants: The patient has                            taken Xarelto (rivaroxaban), last dose was 2 days                            prior to procedure. ASA Grade Assessment: III - A                            patient with severe systemic disease. After                            reviewing the risks and benefits, the patient was                            deemed in satisfactory condition to undergo the                            procedure.  After obtaining informed consent, the endoscope was                            passed under direct vision. Throughout the                            procedure, the patient's blood pressure, pulse, and                            oxygen saturations were monitored continuously. The                            Endoscope was introduced through the mouth, and                            advanced to the second part of duodenum. The  upper                            GI endoscopy was accomplished without difficulty.                            The patient tolerated the procedure well. Scope In: Scope Out: Findings:                 One benign-appearing, intrinsic moderate stenosis                            was found 29 cm from the incisors. This stenosis                            measured 1.1 cm (inner diameter). The stenosis was                            traversed. A guidewire was placed and the scope was                            withdrawn. Dilations were performed with Savary                            dilators with mild resistance at 12 mm, 13 mm and                            14 mm. Trace heme on last 2 dilators                           The exam of the esophagus was otherwise normal.                           A medium-sized hiatal hernia was present.                           A few dispersed small erosions were found in the  gastric body and in the gastric antrum. There were                            no stigmata of recent bleeding. Biopsies were taken                            with a cold forceps for histology.                           The exam of the stomach was otherwise normal.                           A 10 mm non-bleeding diverticulum was found in the                            area of the papilla.                           The exam of the duodenum was otherwise normal. Complications:            No immediate complications. Estimated Blood Loss:     Estimated blood loss was minimal. Impression:               - Benign-appearing esophageal stenosis. Dilated.                           - Medium-sized hiatal hernia.                           - Erosive gastropathy. Biopsied.                           - Periampullary diverticulum.                           - Otherwise normal duodenal bulb and second portion                            of the duodenum. Recommendation:           -  Patient has a contact number available for                            emergencies. The signs and symptoms of potential                            delayed complications were discussed with the                            patient. Return to normal activities tomorrow.                            Written discharge instructions were provided to the                            patient.                           -  Clear liquid diet for 2 hours, then advance as                            tolerated to soft diet today. Resume prior diet                            tomorrow.                           - Continue present medications.                           - Await pathology results.                           - Return to GI office in 6 weeks.                           - Resume Xarelto (rivaroxaban) at prior dose                            tomorrow. Refer to managing physician for further                            adjustment of therapy. [name], MD 11/01/2018 8:27:58 AM This report has been signed electronically.

## 2018-11-01 NOTE — Patient Instructions (Signed)
YOU HAD AN ENDOSCOPIC PROCEDURE TODAY AT THE [facility]:   Refer to the procedure report that was given to you for any specific questions about what was found during the examination.  If the procedure report does not answer your questions, please call your gastroenterologist to clarify.  If you requested that your care partner not be given the details of your procedure findings, then the procedure report has been included in a sealed envelope for you to review at your convenience later.  YOU SHOULD EXPECT: Some feelings of bloating in the abdomen. Passage of more gas than usual.  Walking can help get rid of the air that was put into your GI tract during the procedure and reduce the bloating. If you had a lower endoscopy (such as a colonoscopy or flexible sigmoidoscopy) you may notice spotting of blood in your stool or on the toilet paper. If you underwent a bowel prep for your procedure, you may not have a normal bowel movement for a few days.  Please Note:  You might notice some irritation and congestion in your nose or some drainage.  This is from the oxygen used during your procedure.  There is no need for concern and it should clear up in a day or so.  SYMPTOMS TO REPORT IMMEDIATELY:    Following upper endoscopy (EGD)  Vomiting of blood or coffee ground material  New chest pain or pain under the shoulder blades  Painful or persistently difficult swallowing  New shortness of breath  Fever of 100F or higher  Black, tarry-looking stools  Please see handouts given to you on Hiatal Hernia, Stricture and Dilation diet. You may resume xarelto tomorrow.   Return to GI office in 6 weeks.  For urgent or emergent issues, a gastroenterologist can be reached at any hour by calling [phone].   DIET:  Clear liquid diet for 2 hours then you may start a soft diet at 10:30 for the rest of the day. Tomorrow you may proceed to your regular diet.  Drink plenty of fluids but you should  avoid alcoholic beverages for 24 hours.  ACTIVITY:  You should plan to take it easy for the rest of today and you should NOT DRIVE or use heavy machinery until tomorrow (because of the sedation medicines used during the test).    FOLLOW UP: Our staff will call the number listed on your records the next business day following your procedure to check on you and address any questions or concerns that you may have regarding the information given to you following your procedure. If we do not reach you, we will leave a message.  However, if you are feeling well and you are not experiencing any problems, there is no need to return our call.  We will assume that you have returned to your regular daily activities without incident.  If any biopsies were taken you will be contacted by phone or by letter within the next 1-3 weeks.  Please call us at [phone] if you have not heard about the biopsies in 3 weeks.    SIGNATURES/CONFIDENTIALITY: You and/or your care partner have signed paperwork which will be entered into your electronic medical record.  These signatures attest to the fact that that the information above on your After Visit Summary has been reviewed and is understood.  Full responsibility of the confidentiality of this discharge information lies with you and/or your care-partner.  Thank you for letting us take care of your healthcare  needs today.

## 2018-11-01 NOTE — Progress Notes (Signed)
Pt's states no medical or surgical changes since previsit or office visit. 

## 2018-11-02 ENCOUNTER — Telehealth: Payer: Self-pay

## 2018-11-02 NOTE — Telephone Encounter (Signed)
  Follow up Call-  Call back number 11/01/2018  Post procedure Call Back phone  # [phone]  Permission to leave phone message Yes  Some recent data might be hidden     Patient questions:  Do you have a fever, pain , or abdominal swelling? No. Pain Score  0 *  Have you tolerated food without any problems? Yes.    Have you been able to return to your normal activities? Yes.    Do you have any questions about your discharge instructions: Diet   No. Medications  No. Follow up visit  No.  Do you have questions or concerns about your Care? No.  Actions: * If pain score is 4 or above: No action needed, pain <4.

## 2018-11-21 ENCOUNTER — Encounter: Payer: Self-pay | Admitting: Gastroenterology

## 2018-11-27 ENCOUNTER — Other Ambulatory Visit: Payer: Self-pay | Admitting: Adult Health

## 2018-12-01 ENCOUNTER — Other Ambulatory Visit: Payer: Self-pay | Admitting: Adult Health

## 2018-12-06 ENCOUNTER — Telehealth: Payer: Self-pay

## 2018-12-06 ENCOUNTER — Telehealth: Payer: Self-pay | Admitting: Adult Health

## 2018-12-06 NOTE — Telephone Encounter (Signed)
LVM for pt's daughter to call to discuss.  T. Nelson, CMA ° °

## 2018-12-06 NOTE — Telephone Encounter (Signed)
Spoke with pt's daughter who states that pt has been stumbling and having difficulty seeing since Thanksgiving (11/23/18).  Advised pt's daughter that pt needs to be seen at ED ASAP to r/o possible stroke.  Ms. Dorothey Baseman states that she just spoke with cardiologist office and they advised her that if the patient did have a stroke, there wouldn't be anything done for the patient now.  Pt's daughter stated that pt's blood pressure has been high at home x 2 weeks.  Again, advised Ms. Strickland that pt should be seen at ED ASAP per William Hamburger, NP.  Daughter expressed understanding.  Tiajuana Amass, CMA

## 2018-12-06 NOTE — Telephone Encounter (Signed)
Per patient's daughter through MyChart "Dr. Anne Fu, this is Christina Ortiz, daughter to [facility], one of your heart patients. For the past 2-3 weeks, her blood pressure has been ranging from 160+/100. She is dizzy and very unsteady. She complains that her eyesight has become bad and she completely missed the day of Thanksgiving. Is it possible she could have had a stroke and what, if anything should I do about this? Thanks for your response."  Called patient's daughter. She stated that patient has been having dizziness, unsteady, vision issues, and memory issues since Thanksgiving. She is not sure when this began, but is concerned. Patient's daughter stated she does not have any actual BP or HR readings, patient appears to be lacking keeping up with her readings of BP. Made patient an appointment with Dr. Anne Fu next available, so she can be evaluated. Encouraged patient's daughter to call patient's PCP about these issues as well, that her symptoms might not be related to her heart. Patient's daughter verbalized understanding.

## 2018-12-06 NOTE — Telephone Encounter (Signed)
Patient's daughter/ [name] called states she has notice her mom balance is off (pt has bee stumbling frequently & she states due to her being unable to see clearly( daughter wonders if pt experienced a slight stroke/ paralysis) & wishes Orpha Bur to call her to discuss @ [phone].  ---Forwarding message to medical assistant.  --Fausto Skillern

## 2018-12-08 NOTE — Telephone Encounter (Signed)
Agree. Please have her PCP evaluate. Thanks Donato Schultz, MD

## 2018-12-08 NOTE — Telephone Encounter (Signed)
Left message for patient to call back  

## 2018-12-11 NOTE — Telephone Encounter (Signed)
Spoke to patient's daughter [name]) and confirmed her appt with Dr Anne Fu on 12/17 @ 3 pm.  She verbalized understanding and looks forward to discussing the patient's BP trends and concerns.

## 2018-12-12 ENCOUNTER — Ambulatory Visit (INDEPENDENT_AMBULATORY_CARE_PROVIDER_SITE_OTHER): Payer: Medicare PPO | Admitting: Cardiology

## 2018-12-12 ENCOUNTER — Encounter: Payer: Self-pay | Admitting: Cardiology

## 2018-12-12 VITALS — BP 158/100 | HR 63 | Ht 60.0 in | Wt 146.1 lb

## 2018-12-12 DIAGNOSIS — I10 Essential (primary) hypertension: Secondary | ICD-10-CM | POA: Diagnosis not present

## 2018-12-12 DIAGNOSIS — I2583 Coronary atherosclerosis due to lipid rich plaque: Secondary | ICD-10-CM

## 2018-12-12 DIAGNOSIS — I480 Paroxysmal atrial fibrillation: Secondary | ICD-10-CM | POA: Diagnosis not present

## 2018-12-12 DIAGNOSIS — I2510 Atherosclerotic heart disease of native coronary artery without angina pectoris: Secondary | ICD-10-CM | POA: Diagnosis not present

## 2018-12-12 DIAGNOSIS — Z7901 Long term (current) use of anticoagulants: Secondary | ICD-10-CM | POA: Diagnosis not present

## 2018-12-12 MED ORDER — AMLODIPINE BESYLATE 5 MG PO TABS: 5.0000 mg | ORAL_TABLET | Freq: Every day | ORAL | 3 refills | Status: DC

## 2018-12-12 NOTE — Patient Instructions (Addendum)
Medication Instructions:  1) START AMLODIPINE 5 mg daily If you need a refill on your cardiac medications before your next appointment, please call your pharmacy.   Lab work: None  Testing/Procedures: None  Follow-Up: You have a follow up appointment in the [facility] on December 29, 2018 at 2:30PM.  At [facility], you and your health needs are our priority.  As part of our continuing mission to provide you with exceptional heart care, we have created designated Provider Care Teams.  These Care Teams include your primary Cardiologist (physician) and Advanced Practice Providers (APPs -  Physician Assistants and Nurse Practitioners) who all work together to provide you with the care you need, when you need it. You will need a follow up appointment in 6 months.  Please call our office 2 months in advance to schedule this appointment.  You may see Dr. Anne Fu or one of the following Advanced Practice Providers on your designated Care Team:   Norma Fredrickson, NP Nada Boozer, NP . Georgie Chard, NP

## 2018-12-12 NOTE — Progress Notes (Signed)
Cardiology Office Note:    Date:  12/12/2018   ID:  Christina Ortiz, DOB 07-Aug-1929, MRN [id number]  PCP:  Julaine Fusi, NP  Cardiologist:  No primary care provider on file.  Electrophysiologist:  None   Referring MD: Julaine Fusi, NP     History of Present Illness:    Christina Ortiz is a 84 y.o. female here for follow-up of coronary artery disease as well as paroxysmal atrial fibrillation.  2 previous stents in the RCA and LAD in 2006.  Chronic smoker echo with normal ejection fraction and mild aortic stenosis.  Nuclear stress test in 2012 showed no scar or ischemia.  Chest x-ray in 2013 showed hiatal hernia.  Currently on Xarelto 15 mg once a day dose adjusted for renal function.  No bleeding  She is also demonstrated on prior EKGs Mobitz type I second-degree AV block.  She comes in today with a list of blood pressures, ranging from 167-180/89-104.  She is here with her daughter today.  Has been struggling with hypertension.  Has been struggling with fatigue and weakness as well.  I expressed to them that even when we do improve her blood pressure control, there is no guarantee that her fatigue and weakness will improve.  This may be unrelated.  Please see below for further details.  Past Medical History:  Diagnosis Date  . Acute bronchitis 10/2017  . Arthritis   . CKD (chronic kidney disease)    Xarelto dose is 15 mg QD  . Coronary artery disease    a. s/p PCI/stenting to RCA and LAD ([facility] in [place]) in 2006; b. Nuc 1/15: No ischemia, EF 70%  . Depression   . Gallstones   . Gastritis   . Hip fracture (HCC)   . History of cholecystectomy   . History of echocardiogram    a. Echo 1/15: Moderate LVH, EF 55-60%, impaired relaxation, mild AS  . Hyperlipidemia   . Hypertension   . Hypokalemia   . Mobitz type 1 second degree atrioventricular block    a. Event Monitor 3/15: NSR, first-degree AV block, second-degree AB block - Mobitz 1, PACs, NSVT (5  beats)  . PAF (paroxysmal atrial fibrillation) (HCC)   . Thyroid disease     Past Surgical History:  Procedure Laterality Date  . ANGIOPLASTY     with sent placement  . BLEPHAROPLASTY    . BREAST LUMPECTOMY Left   . CARDIAC CATHETERIZATION    . CATARACT EXTRACTION Bilateral   . CHOLECYSTECTOMY    . DILATION AND CURETTAGE OF UTERUS      Current Medications: Current Meds  Medication Sig  . acetaminophen (TYLENOL) 325 MG tablet Take 650 mg by mouth every 6 (six) hours as needed.  Marland Kitchen antiseptic oral rinse (BIOTENE) LIQD 15 mLs by Mouth Rinse route as needed for dry mouth.  . cholecalciferol (VITAMIN D) 1000 units tablet Take 1,000 Units by mouth every evening.  . folic acid (FOLVITE) 1 MG tablet Take 1 tablet (1 mg total) by mouth daily.  Marland Kitchen ipratropium-albuterol (DUONEB) 0.5-2.5 (3) MG/3ML SOLN Take 3 mLs by nebulization 2 (two) times daily.  Marland Kitchen levothyroxine (SYNTHROID, LEVOTHROID) 75 MCG tablet TAKE 1 TABLET BY MOUTH DAILY BEFORE BREAKFAST.  . metoprolol tartrate (LOPRESSOR) 25 MG tablet TAKE 1/2 TABLET BY MOUTH 2 TIMES DAILY.  . multivitamin (PROSIGHT) TABS tablet Take 1 tablet by mouth daily.  Marland Kitchen MYRBETRIQ 50 MG TB24 tablet Take 1 tablet by mouth daily.  Marland Kitchen omeprazole (PRILOSEC) 20  MG capsule Take 20 mg by mouth daily as needed.  Marland Kitchen PARoxetine (PAXIL) 40 MG tablet TAKE 1 TABLET BY MOUTH DAILY  . polyethylene glycol (MIRALAX / GLYCOLAX) packet Take 17 g by mouth daily.  . potassium chloride (K-DUR) 10 MEQ tablet Take 1 tablet (10 mEq total) by mouth daily.  Marland Kitchen Propylene Glycol (SYSTANE BALANCE) 0.6 % SOLN Place 1 drop into both eyes daily.  . quinapril (ACCUPRIL) 40 MG tablet Take 1 tablet (40 mg total) by mouth daily.  . simvastatin (ZOCOR) 5 MG tablet TAKE 1 TABLET BY MOUTH EVERY EVENING.  [name] 15 MG TABS tablet TAKE 1 TABLET (15 MG TOTAL) BY MOUTH DAILY WITH SUPPER.     Allergies:   Tape   Social History   Socioeconomic History  . Marital status: Widowed    Spouse name:  Not on file  . Number of children: 1  . Years of education: Not on file  . Highest education level: Not on file  Occupational History  . Occupation: retired  Engineer, production  . Financial resource strain: Not on file  . Food insecurity:    Worry: Not on file    Inability: Not on file  . Transportation needs:    Medical: Not on file    Non-medical: Not on file  Tobacco Use  . Smoking status: Former Smoker    Packs/day: 0.50    Years: 67.00    Pack years: 33.50    Types: Cigarettes    Last attempt to quit: 10/24/2014    Years since quitting: 4.1  . Smokeless tobacco: Never Used  . Tobacco comment: quit 2015  Substance and Sexual Activity  . Alcohol use: Yes    Alcohol/week: 10.0 standard drinks    Types: 10 Shots of liquor per week    Comment: 7 a week  . Drug use: No  . Sexual activity: Never  Lifestyle  . Physical activity:    Days per week: Not on file    Minutes per session: Not on file  . Stress: Not on file  Relationships  . Social connections:    Talks on phone: Not on file    Gets together: Not on file    Attends religious service: Not on file    Active member of club or organization: Not on file    Attends meetings of clubs or organizations: Not on file    Relationship status: Not on file  Other Topics Concern  . Not on file  Social History Narrative  . Not on file     Family History: The patient's family history includes Esophageal cancer in her paternal uncle; Heart attack in her mother; Heart disease in her mother; Heart failure in her brother; Lung cancer in her father; Lymphoma in her brother. There is no history of Colon cancer, Colon polyps, Rectal cancer, or Stomach cancer.  ROS:   Please see the history of present illness.     All other systems reviewed and are negative.  EKGs/Labs/Other Studies Reviewed:    The following studies were reviewed today: EKG lab work prior office notes  EKG:  EKG is ordered today.  The ekg ordered today  demonstrates sinus rhythm 63 with first-degree AV block 270 ms poor R wave progression left posterior fascicular block.  Recent Labs: 01/24/2018: TSH 0.970 06/07/2018: Hemoglobin 12.1; Platelets 196 09/07/2018: ALT 5; BUN 18; Creatinine, Ser 0.86; Magnesium 1.3; Potassium 4.0; Sodium 143  Recent Lipid Panel    Component Value Date/Time  CHOL 132 01/24/2018 0930   TRIG 77 01/24/2018 0930   HDL 57 01/24/2018 0930   CHOLHDL 2.3 01/24/2018 0930   LDLCALC 60 01/24/2018 0930    Physical Exam:    VS:  BP (!) 158/100   Pulse 63   Ht 5' (1.524 m)   Wt 146 lb 1.9 oz (66.3 kg)   BMI 28.54 kg/m     Wt Readings from Last 3 Encounters:  12/12/18 146 lb 1.9 oz (66.3 kg)  11/01/18 145 lb (65.8 kg)  10/27/18 145 lb (65.8 kg)     GEN: Elderly Well nourished, well developed in no acute distress HEENT: Normal NECK: No JVD; No carotid bruits LYMPHATICS: No lymphadenopathy CARDIAC: RRR, no murmurs, rubs, gallops RESPIRATORY:  Clear to auscultation without rales, wheezing or rhonchi  ABDOMEN: Soft, non-tender, non-distended MUSCULOSKELETAL:  No edema; No deformity  SKIN: Warm and dry NEUROLOGIC:  Alert and oriented x 3 PSYCHIATRIC:  Normal affect   ASSESSMENT:    1. PAF (paroxysmal atrial fibrillation) (HCC)   2. Essential hypertension   3. Coronary artery disease due to lipid rich plaque   4. Chronic anticoagulation    PLAN:    In order of problems listed above:  Paroxysmal atrial fibrillation -On Xarelto 15 mg a day creatinine clearance dose adjusting.  She has had previous weekend binges with vodka.  She does state that she does not drink every day.  I expressed my concerns about binge drinking with vodka and using Xarelto at the same time at age 50.  We will have trouble controlling her blood pressures as well.  Currently, sinus rhythm with first-degree AV block.  Be careful with metoprolol.  Chronic anticoagulation -Xarelto 15 check CBC every 6 months.  Coronary artery  disease -LAD and RCA stent placed in 2006.  Nuclear stress test 2012 reassuring.  No anginal symptoms.  Essential hypertension - Watch Toprol especially with first-degree AV block.  Some recent readings have been fairly elevated. - Continue with Accupril 40 mg once a day. - We will go ahead and start amlodipine 5 mg once a day. - We will go ahead and have her follow-up in the hypertension clinic here for further adjustments.  I would like to see improvement overall in her blood pressure.  Once again discussed alcohol cessation to help [place] with that.  Former smoker -Quit in 2015.  Hyperlipidemia - Continue with low-dose simvastatin.  Could not tolerate higher dosing.  No myalgias.   On 09/07/2018-creatinine is 0.86, potassium 4.0 TSH 0.97 hemoglobin A1c 5.1 LDL 60  Medication Adjustments/Labs and Tests Ordered: Current medicines are reviewed at length with the patient today.  Concerns regarding medicines are outlined above.  Orders Placed This Encounter  Procedures  . EKG 12-Lead   Meds ordered this encounter  Medications  . amLODipine (NORVASC) 5 MG tablet    Sig: Take 1 tablet (5 mg total) by mouth daily.    Dispense:  90 tablet    Refill:  3    Patient Instructions  Medication Instructions:  1) START AMLODIPINE 5 mg daily If you need a refill on your cardiac medications before your next appointment, please call your pharmacy.   Lab work: None  Testing/Procedures: None  Follow-Up: At [facility], you and your health needs are our priority.  As part of our continuing mission to provide you with exceptional heart care, we have created designated Provider Care Teams.  These Care Teams include your primary Cardiologist (physician) and Advanced Practice Providers (APPs -  Physician Assistants and Nurse Practitioners) who all work together to provide you with the care you need, when you need it. You will need a follow up appointment in 6 months.  Please call our office 2  months in advance to schedule this appointment.  You may see Dr. Anne Fu or one of the following Advanced Practice Providers on your designated Care Team:   Norma Fredrickson, NP Nada Boozer, NP . Georgie Chard, NP      Signed, Donato Schultz, MD  12/12/2018 3:44 PM     [facility]

## 2018-12-13 ENCOUNTER — Encounter: Payer: Self-pay | Admitting: Gastroenterology

## 2018-12-13 ENCOUNTER — Ambulatory Visit (INDEPENDENT_AMBULATORY_CARE_PROVIDER_SITE_OTHER): Payer: Medicare PPO | Admitting: Gastroenterology

## 2018-12-13 VITALS — BP 160/80 | HR 56 | Ht 60.75 in | Wt 147.1 lb

## 2018-12-13 DIAGNOSIS — R1310 Dysphagia, unspecified: Secondary | ICD-10-CM | POA: Diagnosis not present

## 2018-12-13 DIAGNOSIS — K219 Gastro-esophageal reflux disease without esophagitis: Secondary | ICD-10-CM

## 2018-12-13 DIAGNOSIS — K222 Esophageal obstruction: Secondary | ICD-10-CM

## 2018-12-13 NOTE — Patient Instructions (Signed)
Continue omeprazole daily.   Follow up as needed.   Thank you for choosing me and [facility].  Venita Lick. Pleas Koch., MD., Clementeen Graham

## 2018-12-13 NOTE — Progress Notes (Signed)
    History of Present Illness: This is an 67 female returning for follow-up of GERD and esophageal stricture.  She is accompanied by her daughter.  She relates a significant improvement in dysphagia following dilation.  She has noted occasional difficulty swallowing dry toast and she localizes the symptoms to her neck.  No other gastrointestinal complaints.  Her daughter is concerned she does not always take Prilosec every day as prescribed but the patient reassures that she has been doing so.  EGD 10/2018 - Benign-appearing esophageal stenosis. Dilated to 14 mm with Savary. - Medium-sized hiatal hernia. - Erosive gastropathy. Biopsied. - Periampullary diverticulum. - Otherwise normal duodenal bulb and second portion of the duodenum.  Current Medications, Allergies, Past Medical History, Past Surgical History, Family History and Social History were reviewed in [employer] record.  Physical Exam: General: Well developed, well nourished, elderly, no acute distress Head: Normocephalic and atraumatic Eyes:  sclerae anicteric, EOMI Ears: Normal auditory acuity Mouth: No deformity or lesions Lungs: Clear throughout to auscultation Heart: Regular rate and rhythm; no murmurs, rubs or bruits Abdomen: Soft, non tender and non distended. No masses, hepatosplenomegaly or hernias noted. Normal Bowel sounds Rectal: Not done Musculoskeletal: Symmetrical with no gross deformities  Pulses:  Normal pulses noted Extremities: No clubbing, cyanosis, edema or deformities noted Neurological: Alert oriented x 4, grossly nonfocal Psychological:  Alert and cooperative. Normal mood and affect   Assessment and Recommendations:  1.  GERD with an esophageal stricture. Dysphagia significantly improved post dilation however she has noted some difficulty swallowing toast.  Her persistent symptoms may or may not be related to her stricture.  Given prior dilation of 14 mm offered repeat EGD with  dilation. She would like to hold on EGD unless her symptoms worsen.  Given her minimal symptoms, anticoagulation and age I think this is a reasonable plan.  She is advised to continue omeprazole 20 mg po qam for long-term treatment. GI follow up in 1 year and prn.

## 2018-12-28 ENCOUNTER — Other Ambulatory Visit: Payer: Self-pay | Admitting: Adult Health

## 2018-12-28 ENCOUNTER — Ambulatory Visit: Payer: Medicare PPO | Admitting: Cardiology

## 2018-12-29 ENCOUNTER — Ambulatory Visit (INDEPENDENT_AMBULATORY_CARE_PROVIDER_SITE_OTHER): Payer: Medicare PPO | Admitting: Pharmacist

## 2018-12-29 VITALS — BP 140/80 | HR 61

## 2018-12-29 DIAGNOSIS — I10 Essential (primary) hypertension: Secondary | ICD-10-CM

## 2018-12-29 MED ORDER — AMLODIPINE BESYLATE 10 MG PO TABS: 10.0000 mg | ORAL_TABLET | Freq: Every day | ORAL | 3 refills | Status: DC

## 2018-12-29 NOTE — Progress Notes (Signed)
Patient ID: Christina Ortiz                 DOB: 1929/03/27                      MRN: [id number]     HPI: [name] is a 85 y.o. female referred by Dr. Anne Fu to HTN clinic. PMH is significant for afib (on xarelto), CAD s/p stents in the RCA and LAD in 2006, mobitz type 1 second degree AV block, HTN. At last clinic visit on 12/12/18 with Dr. Anne Fu patients blood pressure was elevated at 158/100. Dr. Anne Fu initiated amlodipine 5mg  and discussed cessation of alcohol.  Patient is accompanied by her daughter today. She denies dizziness, lightheadedness, falls, headaches, blurred vision or swelling. Patient states that her vision is always poor. She has macular degeneration.   Patient brings a list of her blood pressures since Dec 18. Slight improvement in blood pressure seen since adding amlodipine a few weeks ago. She has an arm cuff at home, thinks the brand might be [employer]. Patient was educated on proper blood pressure taking technique. She did not know her feet should be flat on the floor.  Patient does admit to drinking. Patient states that she doesn't drink everyday, but her daughter gives the impression that she drinks fairly a lot.    Current HTN meds: metoprolol tartrate 12.5mg  BID, quinapril 40mg  daily, amlodipine 5mg  daily Previously tried: doxazosin, amlodipine, lisinopril (does not know why she stopped)  BP goal: <130/80  Family History: The patient's family history includes Esophageal cancer in her paternal uncle; Heart attack in her mother; Heart disease in her mother; Heart failure in her brother; Lung cancer in her father; Lymphoma in her brother. There is no history of Colon cancer, Colon polyps, Rectal cancer, or Stomach cancer.  Social History: binge drinks vodka  Exercise: unsteady on her feet  Home BP readings:  Dec 18-Jan 2  160/80, 147/82, 150/88, 161/107, 160/89, 147/91, 151/92, 140/88, 155/48, 158/84, 155/93, 144/81, 138/86, 140/75, 140/75, 139/91, 142/85,  151/92, 147/92, 134/92, 139/93, 160/93  Wt Readings from Last 3 Encounters:  12/13/18 147 lb 2 oz (66.7 kg)  12/12/18 146 lb 1.9 oz (66.3 kg)  11/01/18 145 lb (65.8 kg)   BP Readings from Last 3 Encounters:  12/29/18 140/80  12/13/18 (!) 160/80  12/12/18 (!) 158/100   Pulse Readings from Last 3 Encounters:  12/29/18 61  12/13/18 (!) 56  12/12/18 63    Renal function: CrCl cannot be calculated (Patient's most recent lab result is older than the maximum 21 days allowed.).  Past Medical History:  Diagnosis Date  . Acute bronchitis 10/2017  . Arthritis   . CKD (chronic kidney disease)    Xarelto dose is 15 mg QD  . Coronary artery disease    a. s/p PCI/stenting to RCA and LAD ([facility] in [place]) in 2006; b. Nuc 1/15: No ischemia, EF 70%  . Depression   . Gallstones   . Gastritis   . Hiatal hernia   . Hip fracture (HCC)   . History of cholecystectomy   . History of echocardiogram    a. Echo 1/15: Moderate LVH, EF 55-60%, impaired relaxation, mild AS  . Hyperlipidemia   . Hypertension   . Hypokalemia   . Mobitz type 1 second degree atrioventricular block    a. Event Monitor 3/15: NSR, first-degree AV block, second-degree AB block - Mobitz 1, PACs, NSVT (5 beats)  . PAF (paroxysmal atrial  fibrillation) (HCC)   . Thyroid disease     Current Outpatient Medications on File Prior to Visit  Medication Sig Dispense Refill  . acetaminophen (TYLENOL) 325 MG tablet Take 650 mg by mouth every 6 (six) hours as needed.    . cholecalciferol (VITAMIN D) 1000 units tablet Take 1,000 Units by mouth every evening.    . folic acid (FOLVITE) 1 MG tablet Take 1 tablet (1 mg total) by mouth daily. 90 tablet 3  . ipratropium-albuterol (DUONEB) 0.5-2.5 (3) MG/3ML SOLN Take 3 mLs by nebulization as needed.     Marland Kitchen levothyroxine (SYNTHROID, LEVOTHROID) 75 MCG tablet TAKE 1 TABLET BY MOUTH DAILY BEFORE BREAKFAST. 90 tablet 1  . metoprolol tartrate (LOPRESSOR) 25 MG tablet TAKE 1/2 TABLET BY  MOUTH 2 TIMES DAILY. 90 tablet 1  . MYRBETRIQ 50 MG TB24 tablet Take 1 tablet by mouth daily.  11  . omeprazole (PRILOSEC) 20 MG capsule Take 20 mg by mouth daily as needed.    Marland Kitchen PARoxetine (PAXIL) 40 MG tablet TAKE 1 TABLET BY MOUTH DAILY 90 tablet 0  . potassium chloride (K-DUR) 10 MEQ tablet Take 1 tablet (10 mEq total) by mouth daily. 90 tablet 2  . quinapril (ACCUPRIL) 40 MG tablet Take 1 tablet (40 mg total) by mouth daily. 90 tablet 2  . simvastatin (ZOCOR) 5 MG tablet TAKE 1 TABLET BY MOUTH EVERY EVENING. 90 tablet 1  . XARELTO 15 MG TABS tablet TAKE 1 TABLET (15 MG TOTAL) BY MOUTH DAILY WITH SUPPER. 90 tablet 3  . antiseptic oral rinse (BIOTENE) LIQD 15 mLs by Mouth Rinse route as needed for dry mouth.    . multivitamin (PROSIGHT) TABS tablet Take 1 tablet by mouth daily. (Patient not taking: Reported on 12/29/2018) 30 each 0  . polyethylene glycol (MIRALAX / GLYCOLAX) packet Take 17 g by mouth daily.    Marland Kitchen Propylene Glycol (SYSTANE BALANCE) 0.6 % SOLN Place 1 drop into both eyes daily.     No current facility-administered medications on file prior to visit.     Allergies  Allergen Reactions  . Tape Other (See Comments)    Patient's skin is thin and will TEAR AND BRUISE EASILY    Blood pressure 140/80, pulse 61.   Assessment/Plan:  1. Hypertension - Blood pressure not at goal of <130/80. Increase amlodipine to 10mg  daily. Patient educated that she could take 2 of the 5mg  tablets until she runs out. Patient educated on the most common side effect of LLE and to call us if this happens. Continue metoprolol 12.5mg  BID and Accupril 40mg  daily. Will recheck blood pressure in 1 month. Patient asked to continue checking her blood pressure 1-2 times day,  keep a log and bring this and her meter to her next appointment.   Discussed stopping drinking or deceasing the amount she drinks. Patient educated on the benefits this would have on her blood pressure. She was also educated of the  increased risk of bleeding she is at with alcohol consumption and Xarelto use. Patient denies any current s/sx of bleeding.   Advised patient to avoid asprin (says she sometimes takes for back pain) and NSAIDs like IBU, naproxen due to increased risk of bleeding and its ability to elevate blood pressure  Follow up in clinic in 1 month  Thank you  Olene Floss, Pharm.D, BCPS [facility]  [address], [place], [place] [address]  Phone: [phone]; Fax: [phone]

## 2018-12-29 NOTE — Patient Instructions (Addendum)
Start taking amlodipine 10mg  daily. You can take 2 of the 5mg  amlodipine tablets to equal 10mg  daily until you run out of 5mg  tablets. Continue metoprolol 12.5mg  (1/2 tablet) twice a day and quinapril 40mg  daily.  Try to decrease or stop drinking alcohol  Do not use NSAIDS like Advil, Naproxen or Aspirin. Use Tylenol for aches and pains  Keep checking your blood pressure 1-2 times day. Keep a log and bring this and your meter with you to your next appointment.  Call us at [phone] with any questions or concerns.

## 2019-01-19 ENCOUNTER — Other Ambulatory Visit: Payer: Self-pay | Admitting: Cardiology

## 2019-01-19 NOTE — Telephone Encounter (Signed)
Pt last saw Dr Anne Fu 12/12/18, last labs 09/07/18 Creat 0.86, age 85, weight 66.7, CrCl 48.53, based on CrCl pt is on appropriate dosage of Xarelto 15mg  QD.  Will refill rx.

## 2019-01-25 NOTE — Progress Notes (Signed)
Patient ID: Christina Ortiz                 DOB: 10/24/1929                      MRN: [id number]     HPI: Christina Ortiz is a 85 y.o. female referred by Dr. Anne Fu to HTN clinic. PMH is significant for afib (on xarelto), CAD s/p stents in the RCA and LAD in 2006, mobitz type 1 second degree AV block, HTN. At last visit on 12/29/18 patients blood pressure was elevated at 140/80. Amlodipine was increased to 10mg  daily. Patient was also educated about limiting alcohol and NSAID use.   Denies dizziness, lightheadedness, falls, headaches, blurred vision, Does report increased swelling with amlodipine, but patient does not find it bothersome. Patient states that her vision is always poor. She has macular degeneration.   Patient reports that she has noticed her HR has been in the 90's while it usually is in the 60's, but she denies any feels of her heart racing.  Home blood pressure cuff today in clinic read 138/79 HR 84- compared to clinic reading of 130/78 in same arm. Cuff appears accurate.   Daughter reports patient still does binge drink Vodka.   Current HTN meds: metoprolol tartrate 12.5mg  BID, quinapril 40mg  daily, amlodipine 10mg  daily Previously tried: doxazosin, amlodipine, lisinopril (does not know why she stopped)  BP goal: <130/80  Family History: The patient's family history includes Esophageal cancer in her paternal uncle; Heart attack in her mother; Heart disease in her mother; Heart failure in her brother; Lung cancer in her father; Lymphoma in her brother. There is no history of Colon cancer, Colon polyps, Rectal cancer, or Stomach cancer.  Social History: binge drinks vodka per previous note  Exercise: unsteady on her feet  Home BP readings:  157/94, 141/89, 156/93, 135/88, 140/79, 147/91, 143/84, 130/86, 139/82, 135/80, 133/86, 131/81, 146/79, 131/71, 137/88, 130/85, 130/83  Wt Readings from Last 3 Encounters:  12/13/18 147 lb 2 oz (66.7 kg)  12/12/18 146 lb 1.9 oz (66.3 kg)   11/01/18 145 lb (65.8 kg)   BP Readings from Last 3 Encounters:  12/29/18 140/80  12/13/18 (!) 160/80  12/12/18 (!) 158/100   Pulse Readings from Last 3 Encounters:  12/29/18 61  12/13/18 (!) 56  12/12/18 63    Renal function: CrCl cannot be calculated (Patient's most recent lab result is older than the maximum 21 days allowed.).  Past Medical History:  Diagnosis Date  . Acute bronchitis 10/2017  . Arthritis   . CKD (chronic kidney disease)    Xarelto dose is 15 mg QD  . Coronary artery disease    a. s/p PCI/stenting to RCA and LAD ([facility] [place]) in 2006; b. Nuc 1/15: No ischemia, EF 70%  . Depression   . Gallstones   . Gastritis   . Hiatal hernia   . Hip fracture (HCC)   . History of cholecystectomy   . History of echocardiogram    a. Echo 1/15: Moderate LVH, EF 55-60%, impaired relaxation, mild AS  . Hyperlipidemia   . Hypertension   . Hypokalemia   . Mobitz type 1 second degree atrioventricular block    a. Event Monitor 3/15: NSR, first-degree AV block, second-degree AB block - Mobitz 1, PACs, NSVT (5 beats)  . PAF (paroxysmal atrial fibrillation) (HCC)   . Thyroid disease     Current Outpatient Medications on File Prior to Visit  Medication  Sig Dispense Refill  . acetaminophen (TYLENOL) 325 MG tablet Take 650 mg by mouth every 6 (six) hours as needed.    Marland Kitchen amLODipine (NORVASC) 10 MG tablet Take 1 tablet (10 mg total) by mouth daily. 180 tablet 3  . antiseptic oral rinse (BIOTENE) LIQD 15 mLs by Mouth Rinse route as needed for dry mouth.    . cholecalciferol (VITAMIN D) 1000 units tablet Take 1,000 Units by mouth every evening.    . folic acid (FOLVITE) 1 MG tablet Take 1 tablet (1 mg total) by mouth daily. 90 tablet 3  . ipratropium-albuterol (DUONEB) 0.5-2.5 (3) MG/3ML SOLN Take 3 mLs by nebulization as needed.     Marland Kitchen levothyroxine (SYNTHROID, LEVOTHROID) 75 MCG tablet TAKE 1 TABLET BY MOUTH DAILY BEFORE BREAKFAST. 90 tablet 1  . metoprolol tartrate  (LOPRESSOR) 25 MG tablet TAKE 1/2 TABLET BY MOUTH 2 TIMES DAILY. 90 tablet 1  . multivitamin (PROSIGHT) TABS tablet Take 1 tablet by mouth daily. (Patient not taking: Reported on 12/29/2018) 30 each 0  . MYRBETRIQ 50 MG TB24 tablet Take 1 tablet by mouth daily.  11  . omeprazole (PRILOSEC) 20 MG capsule Take 20 mg by mouth daily as needed.    Marland Kitchen PARoxetine (PAXIL) 40 MG tablet TAKE 1 TABLET BY MOUTH DAILY 90 tablet 0  . polyethylene glycol (MIRALAX / GLYCOLAX) packet Take 17 g by mouth daily.    . potassium chloride (K-DUR) 10 MEQ tablet Take 1 tablet (10 mEq total) by mouth daily. 90 tablet 2  . Propylene Glycol (SYSTANE BALANCE) 0.6 % SOLN Place 1 drop into both eyes daily.    . quinapril (ACCUPRIL) 40 MG tablet Take 1 tablet (40 mg total) by mouth daily. 90 tablet 2  . simvastatin (ZOCOR) 5 MG tablet TAKE 1 TABLET BY MOUTH EVERY EVENING. 90 tablet 1  . XARELTO 15 MG TABS tablet TAKE 1 TABLET (15 MG TOTAL) BY MOUTH DAILY WITH SUPPER. 90 tablet 2   No current facility-administered medications on file prior to visit.     Allergies  Allergen Reactions  . Tape Other (See Comments)    Patient's skin is thin and will TEAR AND BRUISE EASILY    There were no vitals taken for this visit.   Assessment/Plan:  1. Hypertension - Blood pressure is at goal in clinic today at  <130/80. Home readings are approaching goal. Offered to lower amlodipine dose to reduce swelling but daughter did not want to change what was working and patient states she is fine with the amount of swelling. Will follow up in 2 months to make sure blood pressure is still in goal range.   Thank you  Olene Floss, Pharm.D, BCPS [facility]  [address], [place], [place] [address]  Phone: [phone]; Fax: [phone]

## 2019-01-26 ENCOUNTER — Ambulatory Visit (INDEPENDENT_AMBULATORY_CARE_PROVIDER_SITE_OTHER): Payer: Medicare PPO | Admitting: Pharmacist

## 2019-01-26 ENCOUNTER — Encounter: Payer: Self-pay | Admitting: Pharmacist

## 2019-01-26 VITALS — BP 120/78 | HR 76

## 2019-01-26 DIAGNOSIS — I10 Essential (primary) hypertension: Secondary | ICD-10-CM

## 2019-01-26 NOTE — Patient Instructions (Addendum)
Continue checking your blood pressure at home. Keep a log and bring it with you to your next appointment. If your top number is reading >140 consistently give [place] a call.  If your swelling gets much worse or bothersome, give [place] a call.  Continue metoprolol tartrate 12.5mg  BID, quinapril 40mg  daily and  amlodipine 10mg  daily  Call us at [phone] with any questions or concerns

## 2019-02-13 DIAGNOSIS — H04123 Dry eye syndrome of bilateral lacrimal glands: Secondary | ICD-10-CM | POA: Diagnosis not present

## 2019-02-13 DIAGNOSIS — H35033 Hypertensive retinopathy, bilateral: Secondary | ICD-10-CM | POA: Diagnosis not present

## 2019-02-13 DIAGNOSIS — H353132 Nonexudative age-related macular degeneration, bilateral, intermediate dry stage: Secondary | ICD-10-CM | POA: Diagnosis not present

## 2019-02-13 DIAGNOSIS — Z961 Presence of intraocular lens: Secondary | ICD-10-CM | POA: Diagnosis not present

## 2019-03-23 ENCOUNTER — Ambulatory Visit: Payer: Medicare PPO

## 2019-03-26 ENCOUNTER — Other Ambulatory Visit: Payer: Self-pay | Admitting: Adult Health

## 2019-04-03 ENCOUNTER — Other Ambulatory Visit: Payer: Self-pay | Admitting: Adult Health

## 2019-04-03 DIAGNOSIS — Z0000 Encounter for general adult medical examination without abnormal findings: Secondary | ICD-10-CM

## 2019-04-03 DIAGNOSIS — I10 Essential (primary) hypertension: Secondary | ICD-10-CM

## 2019-04-03 DIAGNOSIS — E039 Hypothyroidism, unspecified: Secondary | ICD-10-CM

## 2019-04-03 DIAGNOSIS — E785 Hyperlipidemia, unspecified: Secondary | ICD-10-CM

## 2019-04-03 NOTE — Telephone Encounter (Signed)
Patient has not had a TSH in over a year.  Do you want pt to have labs prior to refilling medication?  Tiajuana Amass, CMA

## 2019-04-03 NOTE — Telephone Encounter (Signed)
I will give her 90 days of levothyroxine but we need OV with full set of labs in 3 months on her Can you please call her to set-up a f/u Thanks! Orpha Bur

## 2019-04-05 NOTE — Addendum Note (Signed)
Addended by: Stan Head on: 04/05/2019 01:12 PM   Modules accepted: Orders

## 2019-04-05 NOTE — Telephone Encounter (Signed)
LVM for pt's daughter, Ms. Dorothey Baseman, to call to discuss need for labs.  Tiajuana Amass, CMA

## 2019-04-05 NOTE — Telephone Encounter (Signed)
Pt's daughter informed of need for labs in 3 months.  Ms. Christina Ortiz expressed understanding and is agreeable.  Lab appointment scheduled for 07/05/2019.  Tiajuana Amass, CMA

## 2019-04-11 ENCOUNTER — Other Ambulatory Visit: Payer: Self-pay | Admitting: Adult Health

## 2019-04-23 ENCOUNTER — Telehealth: Payer: Self-pay | Admitting: Pharmacist

## 2019-04-23 NOTE — Telephone Encounter (Signed)
Called patient's daughter and LMOM to cancel HTN visit 5/8 due to COVID. Will schedule telehealth visit at their earliest convenience.

## 2019-04-25 ENCOUNTER — Other Ambulatory Visit: Payer: Self-pay | Admitting: Adult Health

## 2019-04-25 NOTE — Telephone Encounter (Signed)
We have not prescribed this medication for the patient since 2018.  Please review and refill if appropriate.  Tiajuana Amass, CMA

## 2019-04-30 NOTE — Telephone Encounter (Signed)
Spoke with pt to cancel HTN visit on Friday due to COVID. Pt reports home BP readings have been great -128/79. Office visit canceled and pt aware to call with concerns.

## 2019-05-04 ENCOUNTER — Ambulatory Visit: Payer: Medicare PPO

## 2019-05-14 ENCOUNTER — Other Ambulatory Visit: Payer: Self-pay | Admitting: Adult Health

## 2019-05-14 NOTE — Telephone Encounter (Signed)
Pt was to have returned 02/2019 for CPE.  Given current  Coronavirus pandemic, please review refill request and need for f/u.  Tiajuana Amass, CMA

## 2019-05-29 ENCOUNTER — Other Ambulatory Visit: Payer: Self-pay | Admitting: Adult Health

## 2019-06-14 DIAGNOSIS — R351 Nocturia: Secondary | ICD-10-CM | POA: Diagnosis not present

## 2019-06-14 DIAGNOSIS — N3941 Urge incontinence: Secondary | ICD-10-CM | POA: Diagnosis not present

## 2019-06-21 ENCOUNTER — Other Ambulatory Visit: Payer: Self-pay | Admitting: Adult Health

## 2019-06-25 ENCOUNTER — Telehealth: Payer: Self-pay

## 2019-06-25 ENCOUNTER — Other Ambulatory Visit: Payer: Self-pay | Admitting: Adult Health

## 2019-06-25 NOTE — Telephone Encounter (Signed)
Please call pt's daughter to schedule telemedicine visit to follow up on chronic conditions.  T. Nelson, CMA 

## 2019-06-25 NOTE — Telephone Encounter (Signed)
Christina Ortiz

## 2019-06-25 NOTE — Telephone Encounter (Signed)
Please call pt's daughter to schedule telemedicine visit to follow up on chronic conditions.  Charyl Bigger, CMA

## 2019-06-28 ENCOUNTER — Other Ambulatory Visit: Payer: Self-pay | Admitting: Adult Health

## 2019-06-29 ENCOUNTER — Other Ambulatory Visit: Payer: Self-pay | Admitting: Adult Health

## 2019-07-02 ENCOUNTER — Other Ambulatory Visit: Payer: Self-pay | Admitting: Adult Health

## 2019-07-05 ENCOUNTER — Other Ambulatory Visit: Payer: Self-pay

## 2019-07-05 ENCOUNTER — Other Ambulatory Visit (INDEPENDENT_AMBULATORY_CARE_PROVIDER_SITE_OTHER): Payer: Medicare PPO

## 2019-07-05 DIAGNOSIS — I10 Essential (primary) hypertension: Secondary | ICD-10-CM | POA: Diagnosis not present

## 2019-07-05 DIAGNOSIS — Z0000 Encounter for general adult medical examination without abnormal findings: Secondary | ICD-10-CM | POA: Diagnosis not present

## 2019-07-05 DIAGNOSIS — R739 Hyperglycemia, unspecified: Secondary | ICD-10-CM | POA: Diagnosis not present

## 2019-07-05 DIAGNOSIS — E039 Hypothyroidism, unspecified: Secondary | ICD-10-CM | POA: Diagnosis not present

## 2019-07-05 DIAGNOSIS — E785 Hyperlipidemia, unspecified: Secondary | ICD-10-CM

## 2019-07-06 LAB — LIPID PANEL
Chol/HDL Ratio: 2.6 ratio (ref 0.0–4.4)
Cholesterol, Total: 146 mg/dL (ref 100–199)
HDL: 57 mg/dL (ref >39)
LDL Calculated: 75 mg/dL (ref 0–99)
Triglycerides: 71 mg/dL (ref 0–149)
VLDL Cholesterol Cal: 14 mg/dL (ref 5–40)

## 2019-07-06 LAB — CBC WITH DIFFERENTIAL/PLATELET
Basophils Absolute: 0 10*3/uL (ref 0.0–0.2)
Basos: 0 %
EOS (ABSOLUTE): 0.3 10*3/uL (ref 0.0–0.4)
Eos: 3 %
Hematocrit: 40.5 % (ref 34.0–46.6)
Hemoglobin: 13.7 g/dL (ref 11.1–15.9)
Immature Grans (Abs): 0.1 10*3/uL (ref 0.0–0.1)
Immature Granulocytes: 1 %
Lymphocytes Absolute: 1.5 10*3/uL (ref 0.7–3.1)
Lymphs: 17 %
MCH: 33.2 pg — ABNORMAL HIGH (ref 26.6–33.0)
MCHC: 33.8 g/dL (ref 31.5–35.7)
MCV: 98 fL — ABNORMAL HIGH (ref 79–97)
Monocytes Absolute: 0.8 10*3/uL (ref 0.1–0.9)
Monocytes: 9 %
Neutrophils Absolute: 6.4 10*3/uL (ref 1.4–7.0)
Neutrophils: 70 %
Platelets: 220 10*3/uL (ref 150–450)
RBC: 4.13 x10E6/uL (ref 3.77–5.28)
RDW: 12 % (ref 11.7–15.4)
WBC: 9.1 10*3/uL (ref 3.4–10.8)

## 2019-07-06 LAB — COMPREHENSIVE METABOLIC PANEL
ALT: 6 IU/L (ref 0–32)
AST: 20 IU/L (ref 0–40)
Albumin/Globulin Ratio: 1.4 (ref 1.2–2.2)
Albumin: 4.1 g/dL (ref 3.6–4.6)
Alkaline Phosphatase: 81 IU/L (ref 39–117)
BUN/Creatinine Ratio: 19 (ref 12–28)
BUN: 21 mg/dL (ref 8–27)
Bilirubin Total: 0.5 mg/dL (ref 0.0–1.2)
CO2: 24 mmol/L (ref 20–29)
Calcium: 9.7 mg/dL (ref 8.7–10.3)
Chloride: 103 mmol/L (ref 96–106)
Creatinine, Ser: 1.12 mg/dL — ABNORMAL HIGH (ref 0.57–1.00)
GFR calc Af Amer: 51 mL/min/{1.73_m2} — ABNORMAL LOW (ref >59)
GFR calc non Af Amer: 44 mL/min/{1.73_m2} — ABNORMAL LOW (ref >59)
Globulin, Total: 3 g/dL (ref 1.5–4.5)
Glucose: 98 mg/dL (ref 65–99)
Potassium: 3.6 mmol/L (ref 3.5–5.2)
Sodium: 145 mmol/L — ABNORMAL HIGH (ref 134–144)
Total Protein: 7.1 g/dL (ref 6.0–8.5)

## 2019-07-06 LAB — TSH: TSH: 1.27 u[IU]/mL (ref 0.450–4.500)

## 2019-07-06 LAB — HEMOGLOBIN A1C
Est. average glucose Bld gHb Est-mCnc: 111 mg/dL
Hgb A1c MFr Bld: 5.5 % (ref 4.8–5.6)

## 2019-07-06 LAB — T3: T3, Total: 92 ng/dL (ref 71–180)

## 2019-07-06 LAB — T4, FREE: Free T4: 1.24 ng/dL (ref 0.82–1.77)

## 2019-07-15 NOTE — Progress Notes (Signed)
Subjective:    Patient ID: Christina Ortiz, female    DOB: 06/21/1929, 85 y.o.   MRN: [id number]  HPI:  [name] presents for regular f/u: afib (on xarelto), CAD s/p stents in the RCA and LAD in 2006, mobitz type 1 second degree AV block, HTN.CKD, Hypothyroidism, Heavy Alcohol Use -She has been also educated about limiting alcohol and NSAID use.   HTN, PAF,  She reports recent lower ext swelling for the last month- this is a chronic issue, may be r/t to CCB use-currently on Amlodipine 10mg  QD She reports ambulatory readings- SBP 110-120s DBP 70-80s She denies dizziness with position changes She denies recent falls She reports reduction in vodka drinking, denies daily use. She states "I don't buy the big bottle any more". She reports increase in chronic lumbago, she denies regular exercise but is very active - house/yard work During this pandemic she has- Waxed all the floors in her house and [employer] furniture Painted outdoor furniture She has remained home, only ventured out for groceries and medical appt's-always worn a mask outside of her home She denies acute cardiac sx's  07/05/2019 Labs- Thyroid Panel- WNL  No change to current Levothyroxine dosage  A1c-WNL, 5.5  CMP-stable  GFR 44, slight decreased from ten months ago  CBC-stable  Lipid Panel- HDL-57 LDL-75  01/26/2019 Card/Phd Notes 1. Hypertension - Blood pressure is at goal in clinic today at  <130/80. Home readings are approaching goal. Offered to lower amlodipine dose to reduce swelling but daughter did not want to change what was working and patient states she is fine with the amount of swelling. Will follow up in 2 months to make sure blood pressure is still in goal range.  Patient Care Team    Relationship Specialty Notifications Start End  William Hamburgeranford, [name], NP PCP - General Family Medicine  03/30/17   Jake BatheSkains, Mark C, MD Consulting Physician Cardiology  03/30/17   Mateo FlowHecker, Kathryn, MD Consulting Physician  Ophthalmology  03/30/17   [facility]    03/30/17   Chilton GreathouseMannam, Praveen, MD Consulting Physician Pulmonary Disease  04/27/18     Patient Active Problem List   Diagnosis Date Noted  . Dysphagia 09/06/2018  . Pain   . Closed displaced fracture of greater trochanter of left femur (HCC) 06/04/2018  . Hypoxia 06/03/2018  . Hospital discharge follow-up 04/27/2018  . Fall 03/31/2018  . Humerus shaft fracture-right 03/31/2018  . Rhabdomyolysis 03/31/2018  . Hypothermia 03/31/2018  . Healthcare maintenance 01/17/2018  . Nocturia 01/17/2018  . Macrocytosis 11/22/2017  . Acute bronchitis due to respiratory syncytial virus (RSV) 11/09/2017  . Acute respiratory failure with hypoxia (HCC) 11/09/2017  . Hypertension 11/09/2017  . PAF (paroxysmal atrial fibrillation) (HCC) 11/09/2017  . Coronary artery disease 11/09/2017  . CKD (chronic kidney disease) stage 2, GFR 60-89 ml/min 11/09/2017  . Hypothyroidism 11/09/2017  . Dyspnea 11/08/2017  . Upper respiratory tract infection 11/08/2017  . Elevated serum creatinine 06/21/2017  . Edema of both ankles 06/21/2017  . Diarrhea 05/25/2017  . Hypokalemia 05/25/2017  . Alcohol abuse 05/25/2017  . Depression, recurrent (HCC) 03/30/2017  . Lumbago 03/30/2017  . Influenza with respiratory manifestation 01/22/2017  . Coronary artery disease due to lipid rich plaque 04/04/2015  . Essential hypertension 04/04/2015  . Hyperlipidemia 04/04/2015  . Former smoker 04/04/2015  . Chronic anticoagulation 04/04/2015     Past Medical History:  Diagnosis Date  . Acute bronchitis 10/2017  . Arthritis   . CKD (chronic kidney disease)  Xarelto dose is 15 mg QD  . Coronary artery disease    a. s/p PCI/stenting to RCA and LAD ([facility] in [place]) in 2006; b. Nuc 1/15: No ischemia, EF 70%  . Depression   . Gallstones   . Gastritis   . Hiatal hernia   . Hip fracture ([place])   . History of cholecystectomy   . History of  echocardiogram    a. Echo 1/15: Moderate LVH, EF 55-60%, impaired relaxation, mild AS  . Hyperlipidemia   . Hypertension   . Hypokalemia   . Mobitz type 1 second degree atrioventricular block    a. Event Monitor 3/15: NSR, first-degree AV block, second-degree AB block - Mobitz 1, PACs, NSVT (5 beats)  . PAF (paroxysmal atrial fibrillation) ([place])   . Thyroid disease      Past Surgical History:  Procedure Laterality Date  . ANGIOPLASTY     with sent placement  . BLEPHAROPLASTY    . BREAST LUMPECTOMY Left   . CARDIAC CATHETERIZATION    . CATARACT EXTRACTION Bilateral   . CHOLECYSTECTOMY    . DILATION AND CURETTAGE OF UTERUS       Family History  Problem Relation Age of Onset  . Heart disease Mother   . Heart attack Mother   . Lung cancer Father   . Lymphoma Brother   . Heart failure Brother   . Esophageal cancer Paternal Uncle   . Colon cancer Neg Hx   . Colon polyps Neg Hx   . Rectal cancer Neg Hx   . Stomach cancer Neg Hx      Social History   Substance and Sexual Activity  Drug Use No     Social History   Substance and Sexual Activity  Alcohol Use Yes  . Alcohol/week: 10.0 standard drinks  . Types: 10 Shots of liquor per week   Comment: 7 a week     Social History   Tobacco Use  Smoking Status Former Smoker  . Packs/day: 0.50  . Years: 67.00  . Pack years: 33.50  . Types: Cigarettes  . Quit date: 10/24/2014  . Years since quitting: 4.7  Smokeless Tobacco Never Used  Tobacco Comment   quit 2015     Outpatient Encounter Medications as of 07/16/2019  Medication Sig  . acetaminophen (TYLENOL) 325 MG tablet Take 650 mg by mouth every 6 (six) hours as needed.  Marland Kitchen antiseptic oral rinse (BIOTENE) LIQD 15 mLs by Mouth Rinse route as needed for dry mouth.  . cholecalciferol (VITAMIN D) 1000 units tablet Take 1,000 Units by mouth every evening.  . folic acid (FOLVITE) 1 MG tablet Take 1 tablet (1 mg total) by mouth daily.  Marland Kitchen ipratropium-albuterol  (DUONEB) 0.5-2.5 (3) MG/3ML SOLN Take 3 mLs by nebulization as needed.   Marland Kitchen levothyroxine (SYNTHROID) 75 MCG tablet TAKE 1 TABLET BY MOUTH DAILY BEFORE BREAKFAST.  . magnesium oxide (MAG-OX) 400 MG tablet Take 400 mg by mouth daily.  . metoprolol tartrate (LOPRESSOR) 25 MG tablet Take 0.5 tablets (12.5 mg total) by mouth 2 (two) times daily. PATIENT NEEDS TO CALL OFFICE TO SCHEDULE APPOINTMENT  . multivitamin (PROSIGHT) TABS tablet Take 1 tablet by mouth daily.  Marland Kitchen omeprazole (PRILOSEC) 20 MG capsule TAKE 1 CAPSULE (20 MG TOTAL) BY MOUTH DAILY AS NEEDED (HEARTBURN).  Marland Kitchen PARoxetine (PAXIL) 40 MG tablet Take 1 tablet (40 mg total) by mouth daily. PATIENT NEEDS FOLLOW UP PRIOR TO ANY FURTHER REFILLS  . polyethylene glycol (MIRALAX / GLYCOLAX) packet  Take 17 g by mouth daily.  . potassium chloride (K-DUR) 10 MEQ tablet TAKE 1 TABLET BY MOUTH DAILY.  Marland Kitchen Propylene Glycol (SYSTANE BALANCE) 0.6 % SOLN Place 1 drop into both eyes daily.  . quinapril (ACCUPRIL) 40 MG tablet TAKE 1 TABLET (40 MG TOTAL) BY MOUTH DAILY.  . simvastatin (ZOCOR) 5 MG tablet TAKE 1 TABLET BY MOUTH EVERY EVENING.  Marland Kitchen tolterodine (DETROL LA) 4 MG 24 hr capsule Take 1 capsule by mouth daily.  [name] 15 MG TABS tablet TAKE 1 TABLET (15 MG TOTAL) BY MOUTH DAILY WITH SUPPER.  Marland Kitchen amLODipine (NORVASC) 10 MG tablet Take 1 tablet (10 mg total) by mouth daily.  . [DISCONTINUED] MYRBETRIQ 50 MG TB24 tablet Take 1 tablet by mouth daily.   No facility-administered encounter medications on file as of 07/16/2019.     Allergies: Tape  Body mass index is 28.79 kg/m.  Blood pressure 135/81, pulse 66, temperature 98.2 F (36.8 C), temperature source Oral, height 5' 0.75" (1.543 m), weight 151 lb 1.6 oz (68.5 kg), SpO2 97 %.  Review of Systems  Constitutional: Positive for fatigue. Negative for activity change, appetite change, chills, diaphoresis, fever and unexpected weight change.  Eyes: Negative for visual disturbance.  Respiratory:  Negative for cough, chest tightness, shortness of breath, wheezing and stridor.   Cardiovascular: Negative for chest pain, palpitations and leg swelling.  Endocrine: Negative for cold intolerance, heat intolerance, polydipsia, polyphagia and polyuria.  Neurological: Negative for dizziness and headaches.  Hematological: Negative for adenopathy. Does not bruise/bleed easily.  Psychiatric/Behavioral: Negative for agitation, behavioral problems, confusion, decreased concentration, dysphoric mood, hallucinations, self-injury, sleep disturbance and suicidal ideas. The patient is not nervous/anxious and is not hyperactive.        Objective:   Physical Exam Vitals signs and nursing note reviewed.  Constitutional:      General: She is not in acute distress.    Appearance: Normal appearance. She is normal weight. She is not ill-appearing, toxic-appearing or diaphoretic.  HENT:     Right Ear: Decreased hearing noted.     Left Ear: Decreased hearing noted.  Cardiovascular:     Rate and Rhythm: Normal rate and regular rhythm.     Pulses: Normal pulses.     Heart sounds: Normal heart sounds.  Pulmonary:     Effort: Pulmonary effort is normal. No respiratory distress.     Breath sounds: Normal breath sounds. No stridor. No wheezing or rhonchi.  Chest:     Chest wall: No tenderness.  Skin:    Capillary Refill: Capillary refill takes less than 2 seconds.  Neurological:     Mental Status: She is alert and oriented to person, place, and time.  Psychiatric:        Mood and Affect: Mood normal.        Behavior: Behavior normal.        Thought Content: Thought content normal.        Judgment: Judgment normal.           Assessment & Plan:   1. Healthcare maintenance   2. Hyperlipidemia, unspecified hyperlipidemia type   3. Depression, recurrent (HCC)   4. Edema of both ankles   5. Hypokalemia   6. Chronic bilateral low back pain, unspecified whether sciatica present   7. Hypothyroidism,  unspecified type   8. CKD (chronic kidney disease) stage 2, GFR 60-89 ml/min     Healthcare maintenance  Continue all medications as directed. Keep alcohol use to minimum. Continue to  check your blood pressure and heart rate several times per week. Recommend daily stretching to help with back pain. Follow-up with cardiology. Remain well hydrated, follow heart healthy diet. Continue to social distance and wear a mask when in public. Follow-up here in 6 months- Medicare Wellness Visit.  Hyperlipidemia 07/05/2019 Lipid Panel- Tot 146 TGs 71 HDL 57 LDL 75 Continue Simvastatin 5mg  QD  Depression, recurrent (HCC) Mood stable on Paroxtine 40mg  QD She denies SI/HI  Edema of both ankles Advised to f/u with Cardiology, on CCB prescribed by Dr. Anne FuSkains  Hypokalemia 07/05/2019 CMP K+ 3.6  Lumbago Provided back exercise information Keep a healthy weight Body mass index is 28.79 kg/m.  Current wt 151  Hypothyroidism 07/05/2019 TSH- 1.270 Free T4- 1.24 T3- 92 Continue Levothyroxine 75mcg QD  CKD (chronic kidney disease) stage 2, GFR 60-89 ml/min Xarelto 15mg  QD- PAF 07/05/2019 CMP, stable CKD CBC stable     FOLLOW-UP:  Return in about 4 months (around 11/16/2019) for TeleMedicine Follow-Up, Medical Wellness.

## 2019-07-16 ENCOUNTER — Other Ambulatory Visit: Payer: Self-pay

## 2019-07-16 ENCOUNTER — Ambulatory Visit (INDEPENDENT_AMBULATORY_CARE_PROVIDER_SITE_OTHER): Payer: Medicare PPO | Admitting: Adult Health

## 2019-07-16 ENCOUNTER — Encounter: Payer: Self-pay | Admitting: Adult Health

## 2019-07-16 DIAGNOSIS — Z0000 Encounter for general adult medical examination without abnormal findings: Secondary | ICD-10-CM | POA: Diagnosis not present

## 2019-07-16 DIAGNOSIS — N182 Chronic kidney disease, stage 2 (mild): Secondary | ICD-10-CM | POA: Diagnosis not present

## 2019-07-16 DIAGNOSIS — E876 Hypokalemia: Secondary | ICD-10-CM

## 2019-07-16 DIAGNOSIS — E039 Hypothyroidism, unspecified: Secondary | ICD-10-CM | POA: Diagnosis not present

## 2019-07-16 DIAGNOSIS — M545 Low back pain: Secondary | ICD-10-CM | POA: Diagnosis not present

## 2019-07-16 DIAGNOSIS — M25472 Effusion, left ankle: Secondary | ICD-10-CM

## 2019-07-16 DIAGNOSIS — F339 Major depressive disorder, recurrent, unspecified: Secondary | ICD-10-CM | POA: Diagnosis not present

## 2019-07-16 DIAGNOSIS — G8929 Other chronic pain: Secondary | ICD-10-CM

## 2019-07-16 DIAGNOSIS — M25471 Effusion, right ankle: Secondary | ICD-10-CM | POA: Diagnosis not present

## 2019-07-16 DIAGNOSIS — E785 Hyperlipidemia, unspecified: Secondary | ICD-10-CM | POA: Diagnosis not present

## 2019-07-16 NOTE — Assessment & Plan Note (Signed)
Xarelto 15mg  QD- PAF 07/05/2019 CMP, stable CKD CBC stable

## 2019-07-16 NOTE — Assessment & Plan Note (Signed)
Provided back exercise information Keep a healthy weight Body mass index is 28.79 kg/m.  Current wt 151

## 2019-07-16 NOTE — Assessment & Plan Note (Signed)
07/05/2019 CMP K+ 3.6

## 2019-07-16 NOTE — Assessment & Plan Note (Signed)
07/05/2019 TSH- 1.270 Free T4- 1.24 T3- 92 Continue Levothyroxine 57mcg QD

## 2019-07-16 NOTE — Assessment & Plan Note (Signed)
  Continue all medications as directed. Keep alcohol use to minimum. Continue to check your blood pressure and heart rate several times per week. Recommend daily stretching to help with back pain. Follow-up with cardiology. Remain well hydrated, follow heart healthy diet. Continue to social distance and wear a mask when in public. Follow-up here in 6 months- Medicare Wellness Visit.

## 2019-07-16 NOTE — Patient Instructions (Addendum)
Chronic Back Pain When back pain lasts longer than 3 months, it is called chronic back pain.The cause of your back pain may not be known. Some common causes include:  Wear and tear (degenerative disease) of the bones, ligaments, or disks in your back.  Inflammation and stiffness in your back (arthritis). People who have chronic back pain often go through certain periods in which the pain is more intense (flare-ups). Many people can learn to manage the pain with home care. Follow these instructions at home: Pay attention to any changes in your symptoms. Take these actions to help with your pain: Activity   Avoid bending and other activities that make the problem worse.  Maintain a proper position when standing or sitting: ? When standing, keep your upper back and neck straight, with your shoulders pulled back. Avoid slouching. ? When sitting, keep your back straight and relax your shoulders. Do not round your shoulders or pull them backward.  Do not sit or stand in one place for long periods of time.  Take brief periods of rest throughout the day. This will reduce your pain. Resting in a lying or standing position is usually better than sitting to rest.  When you are resting for longer periods, mix in some mild activity or stretching between periods of rest. This will help to prevent stiffness and pain.  Get regular exercise. Ask your health care provider what activities are safe for you.  Do not lift anything that is heavier than 10 lb (4.5 kg). Always use proper lifting technique, which includes: ? Bending your knees. ? Keeping the load close to your body. ? Avoiding twisting.  Sleep on a firm mattress in a comfortable position. Try lying on your side with your knees slightly bent. If you lie on your back, put a pillow under your knees. Managing pain  If directed, apply ice to the painful area. Your health care provider may recommend applying ice during the first 24-48 hours after  a flare-up begins. ? Put ice in a plastic bag. ? Place a towel between your skin and the bag. ? Leave the ice on for 20 minutes, 2-3 times per day.  If directed, apply heat to the affected area as often as told by your health care provider. Use the heat source that your health care provider recommends, such as a moist heat pack or a heating pad. ? Place a towel between your skin and the heat source. ? Leave the heat on for 20-30 minutes. ? Remove the heat if your skin turns bright red. This is especially important if you are unable to feel pain, heat, or cold. You may have a greater risk of getting burned.  Try soaking in a warm tub.  Take over-the-counter and prescription medicines only as told by your health care provider.  Keep all follow-up visits as told by your health care provider. This is important. Contact a health care provider if:  You have pain that is not relieved with rest or medicine. Get help right away if:  You have weakness or numbness in one or both of your legs or feet.  You have trouble controlling your bladder or your bowels.  You have nausea or vomiting.  You have pain in your abdomen.  You have shortness of breath or you faint. This information is not intended to replace advice given to you by your health care provider. Make sure you discuss any questions you have with your health care provider. Document Released: 01/20/2005  Document Revised: 04/05/2019 Document Reviewed: 06/22/2017 Elsevier Patient Education  [place] all medications as directed. Keep alcohol use to minimum. Continue to check your blood pressure and heart rate several times per week. Recommend daily stretching to help with back pain. Follow-up with cardiology. Remain well hydrated, follow heart healthy diet. Continue to social distance and wear a mask when in public. Follow-up here in 4 months- Medicare Wellness Visit. GREAT TO SEE YOU!

## 2019-07-16 NOTE — Assessment & Plan Note (Signed)
Advised to f/u with Cardiology, on CCB prescribed by Dr. Marlou Porch

## 2019-07-16 NOTE — Assessment & Plan Note (Signed)
Mood stable on Paroxtine 40mg  QD She denies SI/HI

## 2019-07-16 NOTE — Assessment & Plan Note (Signed)
07/05/2019 Lipid Panel- Tot 146 TGs 71 HDL 57 LDL 75 Continue Simvastatin 5mg  QD

## 2019-07-18 ENCOUNTER — Other Ambulatory Visit: Payer: Self-pay | Admitting: Adult Health

## 2019-07-26 ENCOUNTER — Other Ambulatory Visit: Payer: Self-pay | Admitting: Adult Health

## 2019-07-30 DIAGNOSIS — H04123 Dry eye syndrome of bilateral lacrimal glands: Secondary | ICD-10-CM | POA: Diagnosis not present

## 2019-07-30 DIAGNOSIS — H353132 Nonexudative age-related macular degeneration, bilateral, intermediate dry stage: Secondary | ICD-10-CM | POA: Diagnosis not present

## 2019-07-30 DIAGNOSIS — Z961 Presence of intraocular lens: Secondary | ICD-10-CM | POA: Diagnosis not present

## 2019-07-30 DIAGNOSIS — H35033 Hypertensive retinopathy, bilateral: Secondary | ICD-10-CM | POA: Diagnosis not present

## 2019-08-03 ENCOUNTER — Telehealth: Payer: Self-pay | Admitting: Pharmacist

## 2019-08-03 DIAGNOSIS — I10 Essential (primary) hypertension: Secondary | ICD-10-CM

## 2019-08-03 MED ORDER — AMLODIPINE BESYLATE 5 MG PO TABS: 5.0000 mg | ORAL_TABLET | Freq: Every day | ORAL | 3 refills | Status: DC

## 2019-08-03 NOTE — Telephone Encounter (Signed)
Received message from patient's daughter, Christina Ortiz, with questions about pt's medications. She states she sent 2 MyChart messages this week about her mom's medications but had not received a message back yet.   Patient does take amlodipine 10mg  daily which may be contributing to LEE. Other BP medications include metoprolol tartrate 12.5mg  BID and quinapril 40mg  daily.  Returned call to patient's daughter. BP was 135/81 at last visit with PCP on 07/16/19. Will cut back amlodipine dose to 5mg  daily and advised pt to elevated legs, wear compression stockings, and limit sodium intake. Daughter does report that pt drinks alcohol frequently as well. Due to patient's age and reports of gait instability, will target higher BP goal < 140/68mmHg. She will have pt monitor her BP over the next few weeks and call if LEE has not improved or if BP readings increase > 140/28mmHg on a regular basis.

## 2019-08-06 NOTE — Telephone Encounter (Signed)
Try lasix 20mg  PO QD with Kdur 37meq PO QD. Also continue with conservative measures (salt...) Candee Furbish, MD

## 2019-08-09 IMAGING — DX DG CHEST 2V
2 series · 2 of 2 positions shown · non-contrast
Comparison: 11/09/2017.

CLINICAL DATA: Shortness of breath.

EXAM:
CHEST - 2 VIEW

[chest pa]
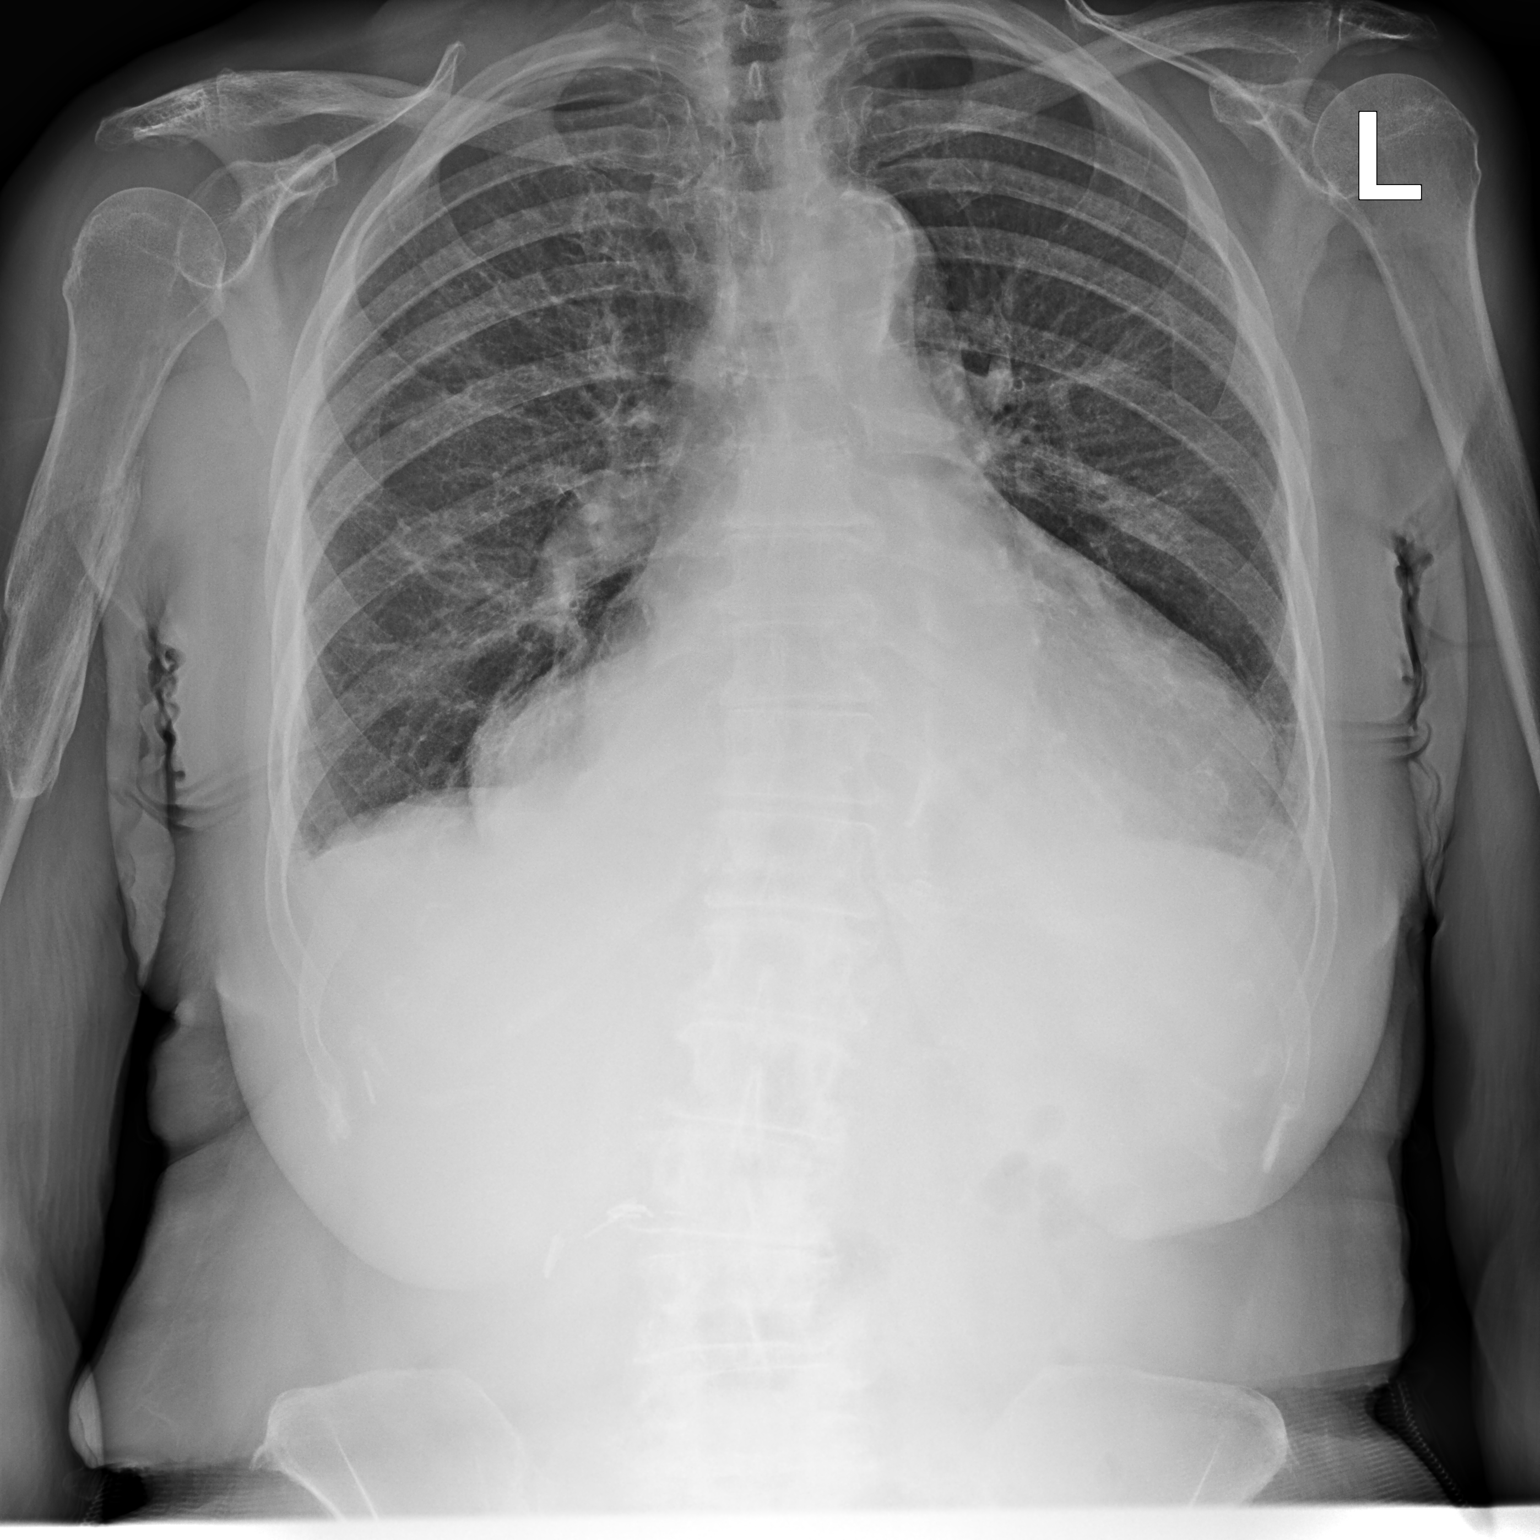

[chest lat]
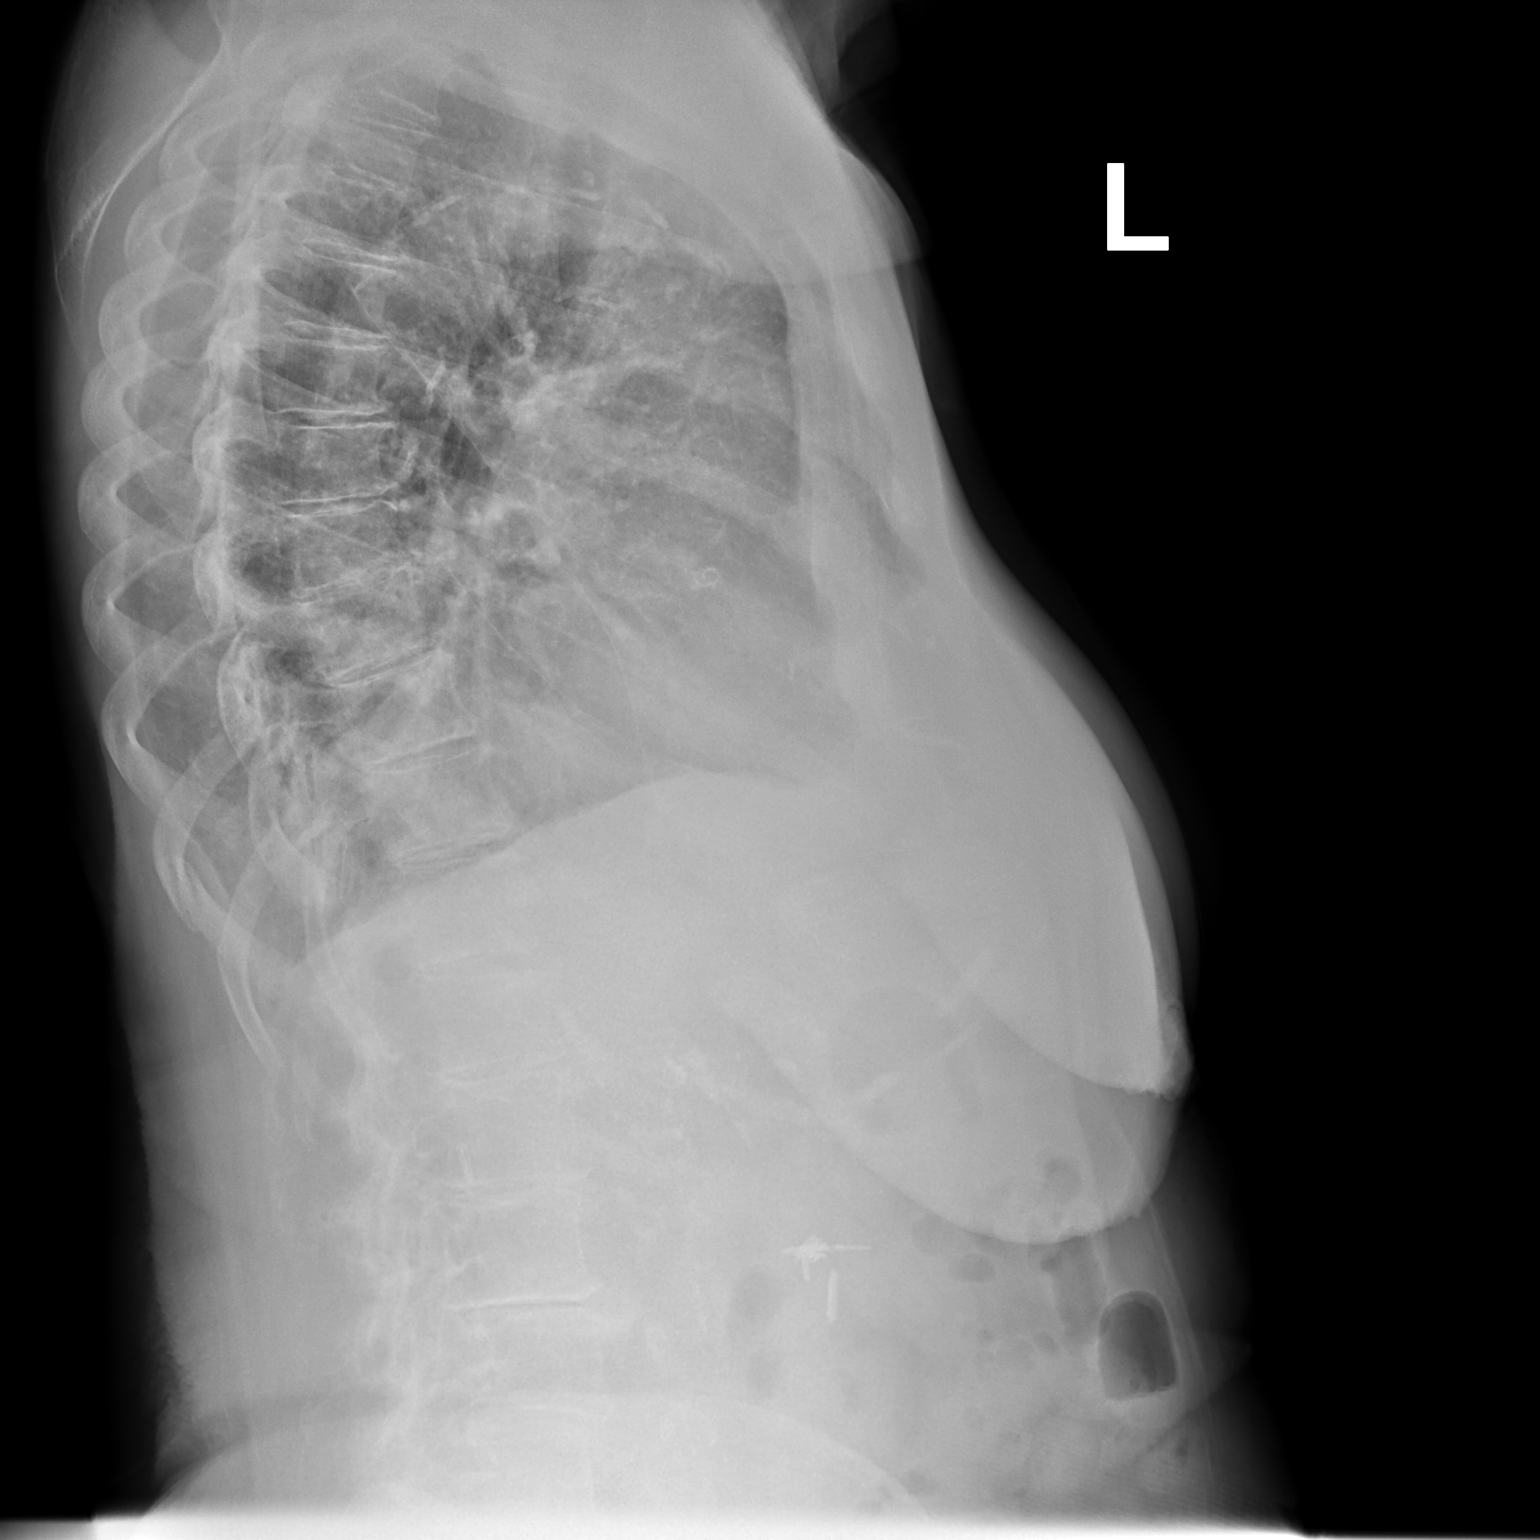

[2 of 2 positions shown; findings below may reference images not displayed]

FINDINGS: Stable severe cardiomegaly with normal pulmonary vascularity. Low
lung volumes with bibasilar atelectasis again noted. No prominent
pleural effusion. No pneumothorax. Surgical clips right upper
quadrant.
IMPRESSION: 1.  Stable severe cardiomegaly.

2.  Low lung volumes with bibasilar atelectasis again noted.

## 2019-08-10 ENCOUNTER — Telehealth: Payer: Self-pay | Admitting: Pharmacist

## 2019-08-10 DIAGNOSIS — M25471 Effusion, right ankle: Secondary | ICD-10-CM

## 2019-08-10 MED ORDER — POTASSIUM CHLORIDE CRYS ER 20 MEQ PO TBCR: 20.0000 meq | EXTENDED_RELEASE_TABLET | Freq: Every day | ORAL | 3 refills | Status: DC

## 2019-08-10 MED ORDER — FUROSEMIDE 20 MG PO TABS: 20.0000 mg | ORAL_TABLET | Freq: Every day | ORAL | 3 refills | Status: DC

## 2019-08-10 NOTE — Telephone Encounter (Signed)
Patient daughter called stating patients swelling has not improved with lower dose of amlodipine. Also states patient is very fatigued. Concerned it might be related to her heart.  I have sent in Rx for furosemide 20mg  daily and potassium 20 MEQ. Advised that patient can take 2 of the 10MEQ she has left at home.  Will plan to check BMP next week, hopefully at appointment with Dr. Marlou Porch or APP. I have transferred daughter to schedulers to make an appointment.

## 2019-08-14 ENCOUNTER — Other Ambulatory Visit: Payer: Medicare PPO

## 2019-08-14 ENCOUNTER — Encounter: Payer: Self-pay | Admitting: Cardiology

## 2019-08-14 ENCOUNTER — Ambulatory Visit (INDEPENDENT_AMBULATORY_CARE_PROVIDER_SITE_OTHER): Payer: Medicare PPO | Admitting: Cardiology

## 2019-08-14 ENCOUNTER — Other Ambulatory Visit: Payer: Self-pay

## 2019-08-14 VITALS — BP 130/74 | HR 73 | Ht 60.75 in | Wt 147.0 lb

## 2019-08-14 DIAGNOSIS — I2510 Atherosclerotic heart disease of native coronary artery without angina pectoris: Secondary | ICD-10-CM

## 2019-08-14 DIAGNOSIS — M25472 Effusion, left ankle: Secondary | ICD-10-CM

## 2019-08-14 DIAGNOSIS — M25471 Effusion, right ankle: Secondary | ICD-10-CM

## 2019-08-14 DIAGNOSIS — I2583 Coronary atherosclerosis due to lipid rich plaque: Secondary | ICD-10-CM

## 2019-08-14 DIAGNOSIS — Z7901 Long term (current) use of anticoagulants: Secondary | ICD-10-CM

## 2019-08-14 DIAGNOSIS — I480 Paroxysmal atrial fibrillation: Secondary | ICD-10-CM

## 2019-08-14 DIAGNOSIS — I10 Essential (primary) hypertension: Secondary | ICD-10-CM | POA: Diagnosis not present

## 2019-08-14 NOTE — Progress Notes (Signed)
Cardiology Office Note:    Date:  08/14/2019   ID:  Christina Ortiz, DOB Aug 24, 1929, MRN [id number]  PCP:  Esaw Grandchild, NP  Cardiologist:  Candee Furbish, MD  Electrophysiologist:  None   Referring MD: Esaw Grandchild, NP   CC: Lower extremity edema  History of Present Illness:    Christina Ortiz is a 85 y.o. female with coronary artery disease with 2 prior stents in RCA and LAD in 2006, paroxysmal atrial fibrillation on Xarelto 15 with Mobitz type I second-degree AV block here today to discuss struggles with lower extremity edema.  They are concerned that this is coming from her heart.  She has had excessive fatigue and this is been discussed in the past that even with control of her blood pressure this may not improve her overall fatigue.  She has been working with her family medicine doctor as well.  We will check an echocardiogram.  I think this is probably her amlodipine as Faith Rogue, NP is also thinking.  Past Medical History:  Diagnosis Date  . Acute bronchitis 10/2017  . Arthritis   . CKD (chronic kidney disease)    Xarelto dose is 15 mg QD  . Coronary artery disease    a. s/p PCI/stenting to RCA and LAD ([facility] in [place]) in 2006; b. Nuc 1/15: No ischemia, EF 70%  . Depression   . Gallstones   . Gastritis   . Hiatal hernia   . Hip fracture ([place])   . History of cholecystectomy   . History of echocardiogram    a. Echo 1/15: Moderate LVH, EF 55-60%, impaired relaxation, mild AS  . Hyperlipidemia   . Hypertension   . Hypokalemia   . Mobitz type 1 second degree atrioventricular block    a. Event Monitor 3/15: NSR, first-degree AV block, second-degree AB block - Mobitz 1, PACs, NSVT (5 beats)  . PAF (paroxysmal atrial fibrillation) ([place])   . Thyroid disease     Past Surgical History:  Procedure Laterality Date  . ANGIOPLASTY     with sent placement  . BLEPHAROPLASTY    . BREAST LUMPECTOMY Left   . CARDIAC CATHETERIZATION    . CATARACT EXTRACTION  Bilateral   . CHOLECYSTECTOMY    . DILATION AND CURETTAGE OF UTERUS      Current Medications: Current Meds  Medication Sig  . acetaminophen (TYLENOL) 325 MG tablet Take 650 mg by mouth every 6 (six) hours as needed.  Marland Kitchen amLODipine (NORVASC) 5 MG tablet Take 1 tablet (5 mg total) by mouth daily.  Marland Kitchen antiseptic oral rinse (BIOTENE) LIQD 15 mLs by Mouth Rinse route as needed for dry mouth.  . cholecalciferol (VITAMIN D) 1000 units tablet Take 1,000 Units by mouth every evening.  . folic acid (FOLVITE) 1 MG tablet Take 1 tablet (1 mg total) by mouth daily.  . furosemide (LASIX) 20 MG tablet Take 1 tablet (20 mg total) by mouth daily.  Marland Kitchen ipratropium-albuterol (DUONEB) 0.5-2.5 (3) MG/3ML SOLN Take 3 mLs by nebulization as needed.   Marland Kitchen levothyroxine (SYNTHROID) 75 MCG tablet TAKE 1 TABLET BY MOUTH DAILY BEFORE BREAKFAST.  . magnesium oxide (MAG-OX) 400 MG tablet Take 400 mg by mouth daily.  . metoprolol tartrate (LOPRESSOR) 25 MG tablet Take 0.5 tablets (12.5 mg total) by mouth 2 (two) times daily.  . multivitamin (PROSIGHT) TABS tablet Take 1 tablet by mouth daily.  Marland Kitchen omeprazole (PRILOSEC) 20 MG capsule TAKE 1 CAPSULE (20 MG TOTAL) BY MOUTH DAILY AS NEEDED (  HEARTBURN).  Marland Kitchen. PARoxetine (PAXIL) 40 MG tablet Take 1 tablet (40 mg total) by mouth daily.  . polyethylene glycol (MIRALAX / GLYCOLAX) packet Take 17 g by mouth daily.  . potassium chloride SA (K-DUR) 20 MEQ tablet Take 1 tablet (20 mEq total) by mouth daily.  Marland Kitchen. Propylene Glycol (SYSTANE BALANCE) 0.6 % SOLN Place 1 drop into both eyes daily.  . quinapril (ACCUPRIL) 40 MG tablet TAKE 1 TABLET (40 MG TOTAL) BY MOUTH DAILY.  . simvastatin (ZOCOR) 5 MG tablet TAKE 1 TABLET BY MOUTH EVERY EVENING.  Marland Kitchen. tolterodine (DETROL LA) 4 MG 24 hr capsule Take 1 capsule by mouth daily.  [name]. XARELTO 15 MG TABS tablet TAKE 1 TABLET (15 MG TOTAL) BY MOUTH DAILY WITH SUPPER.     Allergies:   Tape   Social History   Socioeconomic History  . Marital status:  Widowed    Spouse name: Not on file  . Number of children: 1  . Years of education: Not on file  . Highest education level: Not on file  Occupational History  . Occupation: retired  Engineer, productionocial Needs  . Financial resource strain: Not on file  . Food insecurity    Worry: Not on file    Inability: Not on file  . Transportation needs    Medical: Not on file    Non-medical: Not on file  Tobacco Use  . Smoking status: Former Smoker    Packs/day: 0.50    Years: 67.00    Pack years: 33.50    Types: Cigarettes    Quit date: 10/24/2014    Years since quitting: 4.8  . Smokeless tobacco: Never Used  . Tobacco comment: quit 2015  Substance and Sexual Activity  . Alcohol use: Yes    Alcohol/week: 10.0 standard drinks    Types: 10 Shots of liquor per week    Comment: 7 a week  . Drug use: No  . Sexual activity: Never  Lifestyle  . Physical activity    Days per week: Not on file    Minutes per session: Not on file  . Stress: Not on file  Relationships  . Social Musicianconnections    Talks on phone: Not on file    Gets together: Not on file    Attends religious service: Not on file    Active member of club or organization: Not on file    Attends meetings of clubs or organizations: Not on file    Relationship status: Not on file  Other Topics Concern  . Not on file  Social History Narrative  . Not on file     Family History: The patient's family history includes Esophageal cancer in her paternal uncle; Heart attack in her mother; Heart disease in her mother; Heart failure in her brother; Lung cancer in her father; Lymphoma in her brother. There is no history of Colon cancer, Colon polyps, Rectal cancer, or Stomach cancer.  ROS:   Please see the history of present illness.    No syncope, no bleeding, no orthopnea all other systems reviewed and are negative.  EKGs/Labs/Other Studies Reviewed:    The following studies were reviewed today: Echocardiogram pending  EKG:  EKG is not  ordered today.    Recent Labs: 09/07/2018: Magnesium 1.3 07/05/2019: ALT 6; BUN 21; Creatinine, Ser 1.12; Hemoglobin 13.7; Platelets 220; Potassium 3.6; Sodium 145; TSH 1.270  Recent Lipid Panel    Component Value Date/Time   CHOL 146 07/05/2019 1121   TRIG 71 07/05/2019 1121  HDL 57 07/05/2019 1121   CHOLHDL 2.6 07/05/2019 1121   LDLCALC 75 07/05/2019 1121    Physical Exam:    VS:  BP 130/74   Pulse 73   Ht 5' 0.75" (1.543 m)   Wt 147 lb (66.7 kg)   SpO2 95%   BMI 28.00 kg/m     Wt Readings from Last 3 Encounters:  08/14/19 147 lb (66.7 kg)  07/16/19 151 lb 1.6 oz (68.5 kg)  12/13/18 147 lb 2 oz (66.7 kg)     GEN:  Well nourished, well developed in no acute distress HEENT: Normal NECK: No JVD; No carotid bruits LYMPHATICS: No lymphadenopathy CARDIAC: RRR, no murmurs, rubs, gallops RESPIRATORY:  Clear to auscultation without rales, wheezing or rhonchi  ABDOMEN: Soft, non-tender, non-distended MUSCULOSKELETAL: Moderate ankle/pedal edema; No deformity  SKIN: Warm and dry NEUROLOGIC:  Alert and oriented x 3 PSYCHIATRIC:  Normal affect   ASSESSMENT:    1. Edema of both ankles   2. PAF (paroxysmal atrial fibrillation) (HCC)   3. Essential hypertension    PLAN:    In order of problems listed above:  Paroxysmal atrial fibrillation -Continue with Xarelto 15 mg a day secondary to creatinine clearance adjustment.  Try to discourage heavy alcohol use.  We do not want her to fall.  We do not want her to bleed.  AV block noted on ECG.  Careful with metoprolol.  Chronic anticoagulation -Xarelto 15 mg.  Keeping track of CBC.  Coronary artery disease -Both LAD and RCA stent placed in 2006.  Nuclear stress test in 2012 was reassuring.  No significant anginal symptoms.  Lower extremity edema/ankle/pedal edema -This seems to be her main issue today.  Previously I had started amlodipine 5 mg once a day back in December 2019 to help her with her blood pressure.  This was  increased to 10 mg and January 2020 this potentially could have exacerbated her lower extremity edema as this sometimes happen with this medication even though she continues with her ACE inhibitor Accupril.  Her daughter did not want to change what was working previously and  -I will check an echocardiogram to ensure that she does have proper structure and function. - I think we should stop the amlodipine if the echocardiogram shows normal pump function as this is likely the contributor to her edema.  She is also on low-dose Lasix 20 mg with potassium.  This is fine. -Next up for hypertension could be the addition of spironolactone 12.5 mg once a day increasing to 25 with close follow-up of basic metabolic profile.  We may need to invoke the hypertension clinic again to help [place] with this.  Former smoker -Quit 2015.    Medication Adjustments/Labs and Tests Ordered: Current medicines are reviewed at length with the patient today.  Concerns regarding medicines are outlined above.  Orders Placed This Encounter  Procedures  . ECHOCARDIOGRAM COMPLETE   No orders of the defined types were placed in this encounter.   Patient Instructions  Medication Instructions:  The current medical regimen is effective;  continue present plan and medications.  If you need a refill on your cardiac medications before your next appointment, please call your pharmacy.   Testing/Procedures: Your physician has requested that you have an echocardiogram. Echocardiography is a painless test that uses sound waves to create images of your heart. It provides your doctor with information about the size and shape of your heart and how well your heart's chambers and valves are working. This  procedure takes approximately one hour. There are no restrictions for this procedure.  Follow-Up: . Follow up will be determined after the above testing.  [facility]!!        Signed, Donato SchultzMark  Skains, MD  08/14/2019 4:30 PM    [facility]

## 2019-08-14 NOTE — Patient Instructions (Signed)
Medication Instructions:  The current medical regimen is effective;  continue present plan and medications.  If you need a refill on your cardiac medications before your next appointment, please call your pharmacy.   Testing/Procedures: Your physician has requested that you have an echocardiogram. Echocardiography is a painless test that uses sound waves to create images of your heart. It provides your doctor with information about the size and shape of your heart and how well your heart's chambers and valves are working. This procedure takes approximately one hour. There are no restrictions for this procedure.  Follow-Up: . Follow up will be determined after the above testing.  Thank you for choosing [facility]!!

## 2019-08-14 NOTE — Telephone Encounter (Signed)
Please order ECHO - edema. Check pump function Thanks Candee Furbish, MD

## 2019-08-14 NOTE — Telephone Encounter (Signed)
Pt has an appt today at 4 pm with Dr Marlou Porch.  Will place order for echo during her office visit.

## 2019-08-16 DIAGNOSIS — M545 Low back pain: Secondary | ICD-10-CM | POA: Diagnosis not present

## 2019-08-22 ENCOUNTER — Other Ambulatory Visit: Payer: Medicare PPO | Admitting: *Deleted

## 2019-08-22 ENCOUNTER — Ambulatory Visit (HOSPITAL_COMMUNITY): Payer: Medicare PPO | Attending: Cardiovascular Disease

## 2019-08-22 ENCOUNTER — Other Ambulatory Visit: Payer: Self-pay

## 2019-08-22 DIAGNOSIS — M25472 Effusion, left ankle: Secondary | ICD-10-CM | POA: Insufficient documentation

## 2019-08-22 DIAGNOSIS — I480 Paroxysmal atrial fibrillation: Secondary | ICD-10-CM | POA: Insufficient documentation

## 2019-08-22 DIAGNOSIS — I10 Essential (primary) hypertension: Secondary | ICD-10-CM | POA: Insufficient documentation

## 2019-08-22 DIAGNOSIS — R351 Nocturia: Secondary | ICD-10-CM | POA: Diagnosis not present

## 2019-08-22 DIAGNOSIS — N3941 Urge incontinence: Secondary | ICD-10-CM | POA: Diagnosis not present

## 2019-08-22 DIAGNOSIS — M25471 Effusion, right ankle: Secondary | ICD-10-CM

## 2019-08-22 LAB — BASIC METABOLIC PANEL
BUN/Creatinine Ratio: 34 — ABNORMAL HIGH (ref 12–28)
BUN: 29 mg/dL — ABNORMAL HIGH (ref 8–27)
CO2: 25 mmol/L (ref 20–29)
Calcium: 10.1 mg/dL (ref 8.7–10.3)
Chloride: 100 mmol/L (ref 96–106)
Creatinine, Ser: 0.86 mg/dL (ref 0.57–1.00)
GFR calc Af Amer: 70 mL/min/{1.73_m2} (ref >59)
GFR calc non Af Amer: 61 mL/min/{1.73_m2} (ref >59)
Glucose: 107 mg/dL — ABNORMAL HIGH (ref 65–99)
Potassium: 4.2 mmol/L (ref 3.5–5.2)
Sodium: 142 mmol/L (ref 134–144)

## 2019-08-23 ENCOUNTER — Telehealth: Payer: Self-pay

## 2019-08-23 NOTE — Telephone Encounter (Signed)
-----   Message from Jerline Pain, MD sent at 08/23/2019 11:43 AM EDT ----- Kidney function is stable.  Excellent. Candee Furbish, MD

## 2019-08-23 NOTE — Telephone Encounter (Signed)
lmtcb regarding results 

## 2019-08-23 NOTE — Telephone Encounter (Signed)
Notes recorded by Wilma Flavin, RN on 08/23/2019 at 12:45 PM EDT  Left pts daughter a message to call back.  ------

## 2019-08-23 NOTE — Telephone Encounter (Signed)
Yes I think that stopping her amlodipine is a good idea if she is still having lower extremity edema.  Thank you Candee Furbish, MD

## 2019-08-23 NOTE — Telephone Encounter (Signed)
Spoke with pts daughter, Christina Ortiz regarding pts lab and echo results.   Pt's daughter reports that Christina Ortiz stopped taking her Lasix last week d/t constantly having to urinate and being unable to make it to the bathroom in time. Christina Ortiz reports pt fell d/t urgency/urinating on the floor. Christina Ortiz states pt was drinking alcohol at this time as well. Christina Ortiz does not want her mother back on the Lasix as she considers it a safety risk however she is concerned regarding the swelling in her mothers legs. She reports that swelling has not worsened since stopping the Lasix but it is still very prevalent. She also reports her mothers BP has been elevated but is unable to provide any readings at this time. Routing to Dr. Marlou Porch for advisement.

## 2019-08-23 NOTE — Telephone Encounter (Signed)
Skains advised to stop amlodipine, I relayed message to [name], pts daughter who is agreeable. Pt will continue to keep log of BP and HR. Advised pt to call back with any other concerns.

## 2019-08-29 DIAGNOSIS — R52 Pain, unspecified: Secondary | ICD-10-CM | POA: Diagnosis not present

## 2019-08-29 DIAGNOSIS — M545 Low back pain: Secondary | ICD-10-CM | POA: Diagnosis not present

## 2019-09-04 ENCOUNTER — Other Ambulatory Visit: Payer: Self-pay | Admitting: Adult Health

## 2019-09-08 DIAGNOSIS — M545 Low back pain: Secondary | ICD-10-CM | POA: Diagnosis not present

## 2019-09-15 ENCOUNTER — Other Ambulatory Visit: Payer: Self-pay | Admitting: Adult Health

## 2019-09-15 IMAGING — CR DG FEMUR 2+V*L*
4 series · 4 of 4 positions shown · non-contrast
Comparison: None.

CLINICAL DATA: Fall in bathroom today. Left femur pain. Initial
encounter.

EXAM:
LEFT FEMUR 2 VIEWS

[x femur proximal ap left]
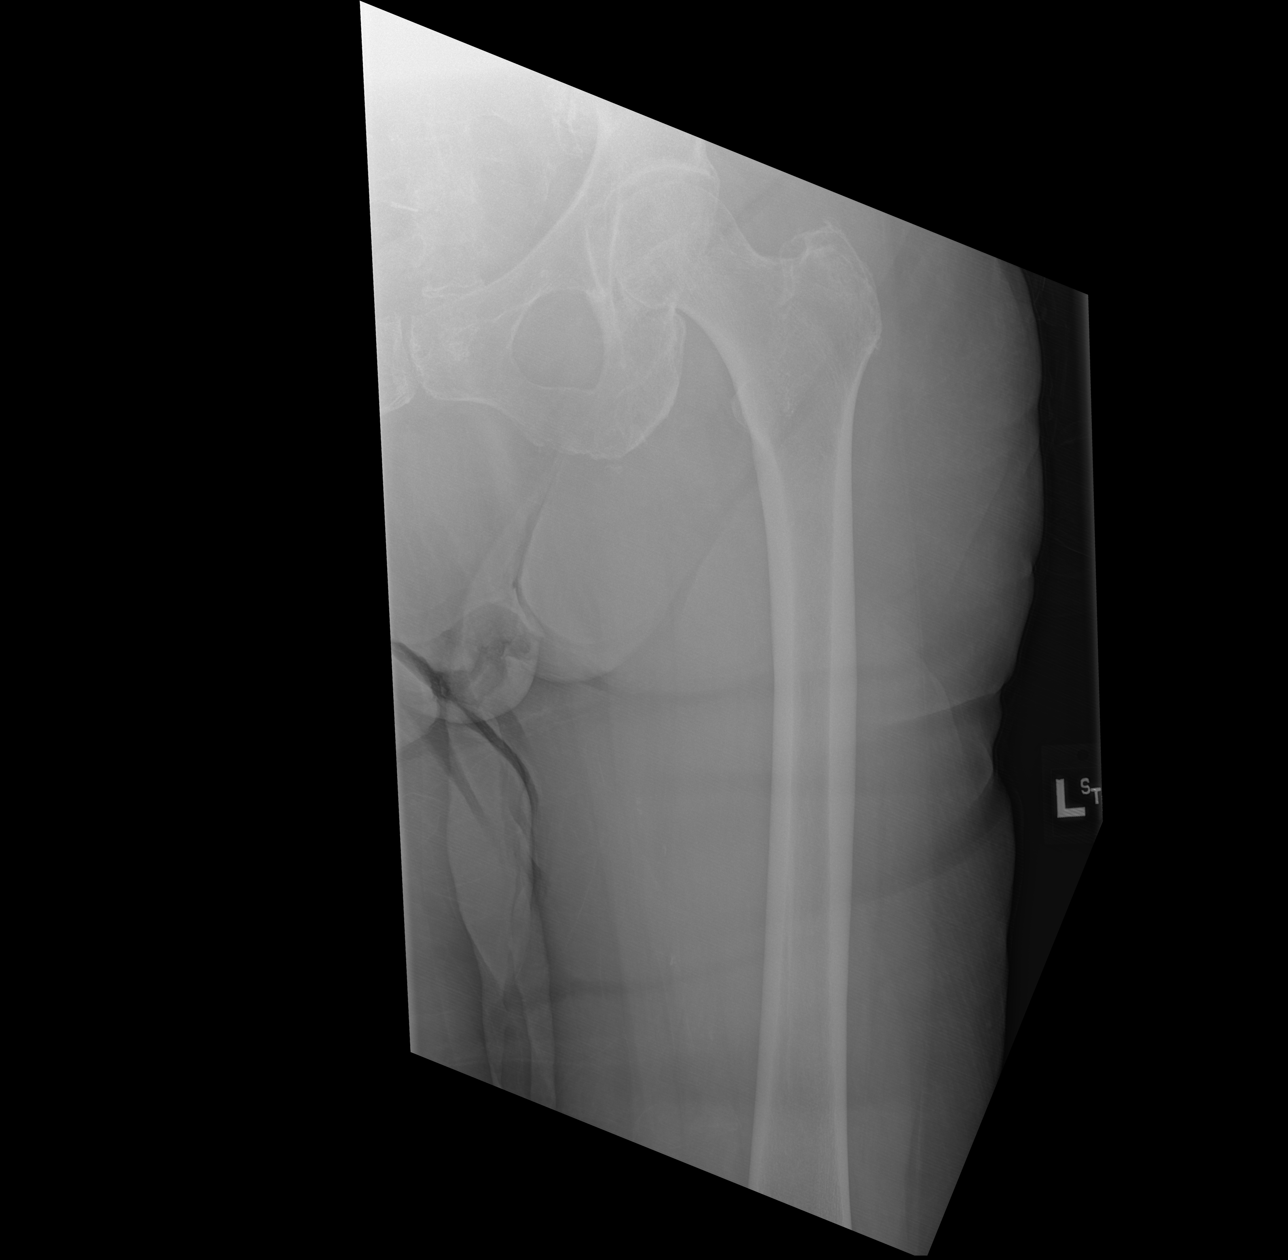

[x femur distal ap left]
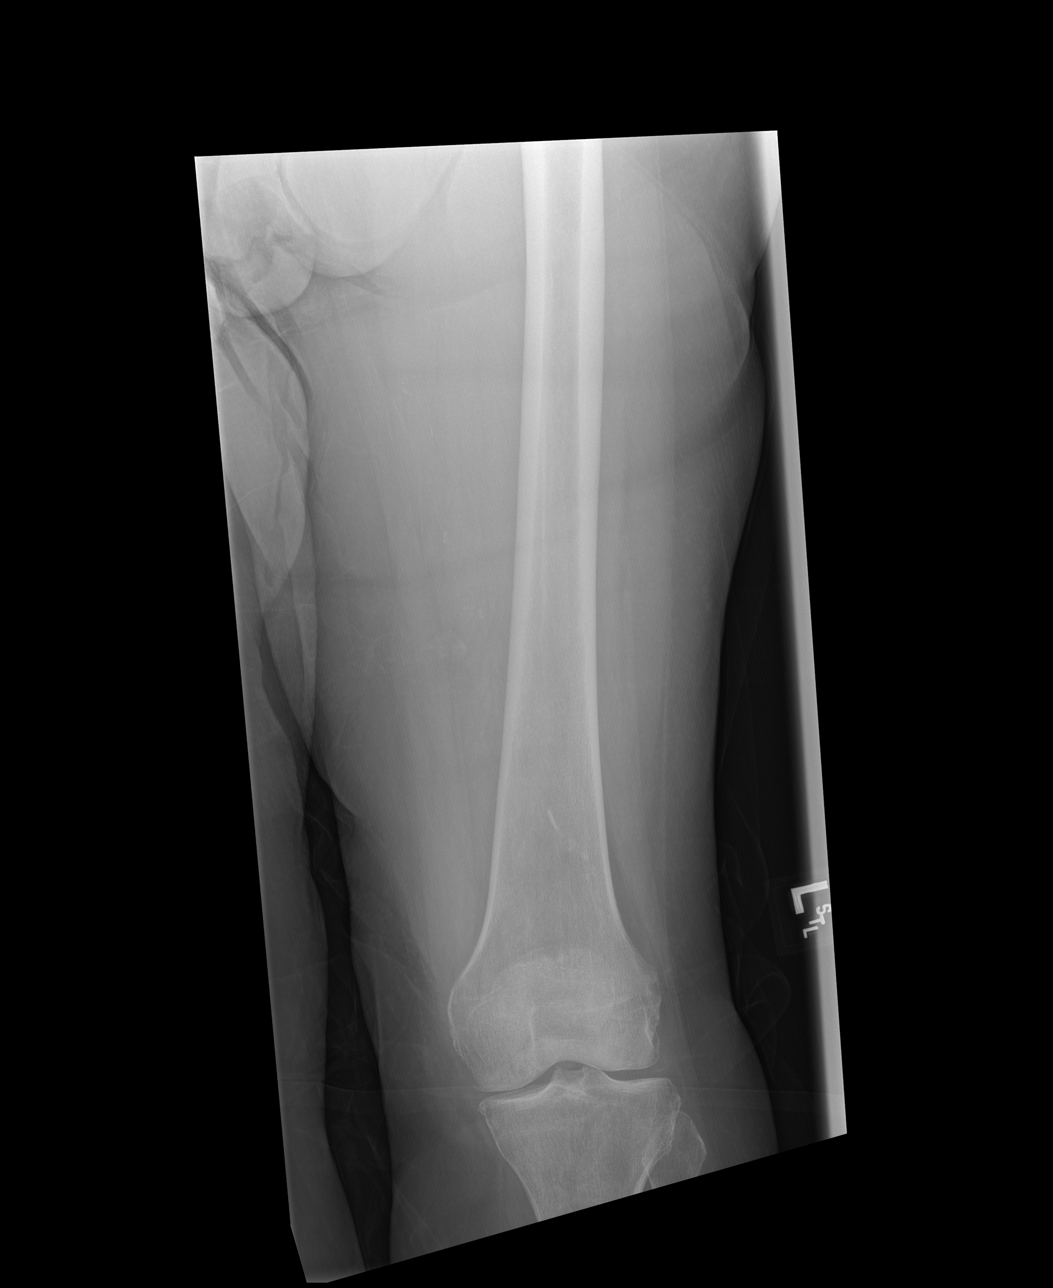

[x femur distal lat left]
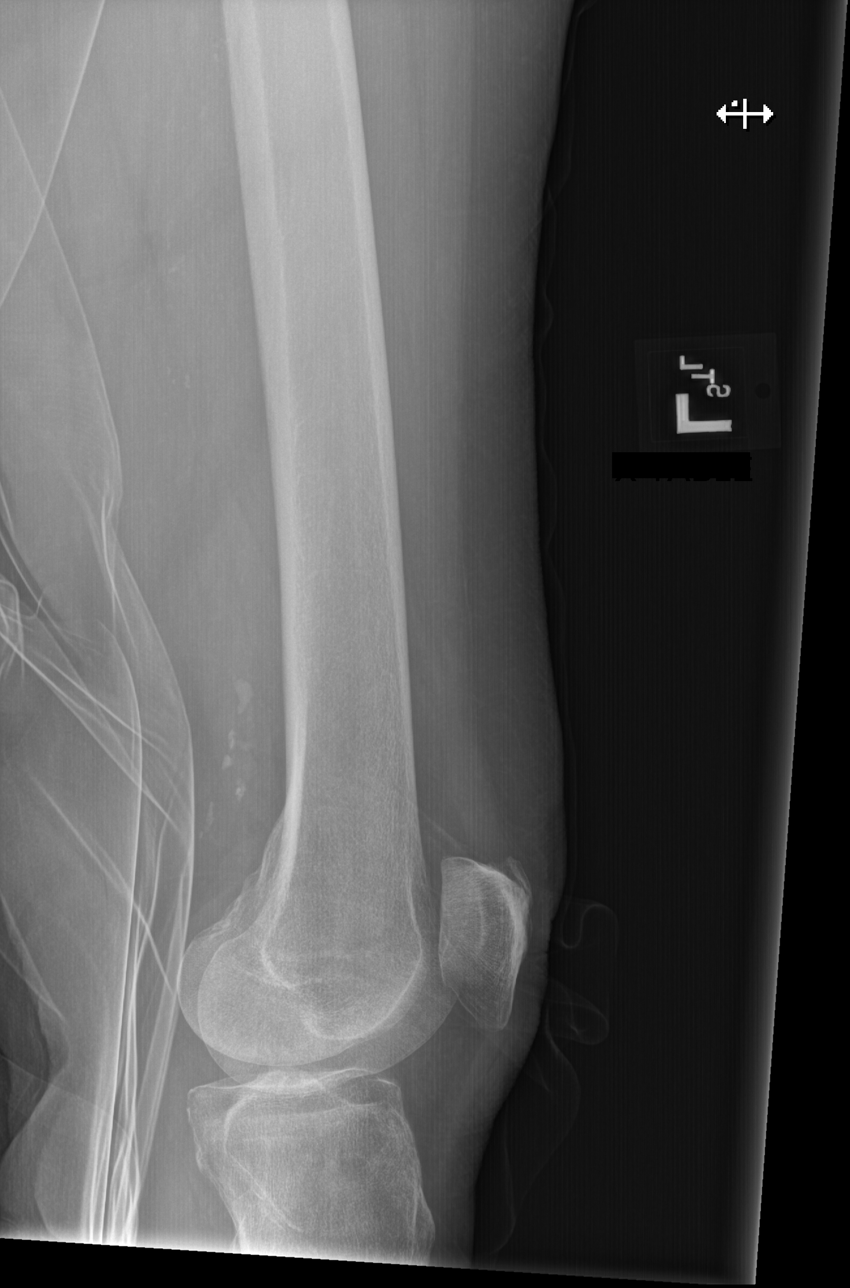

[w hip lat left]
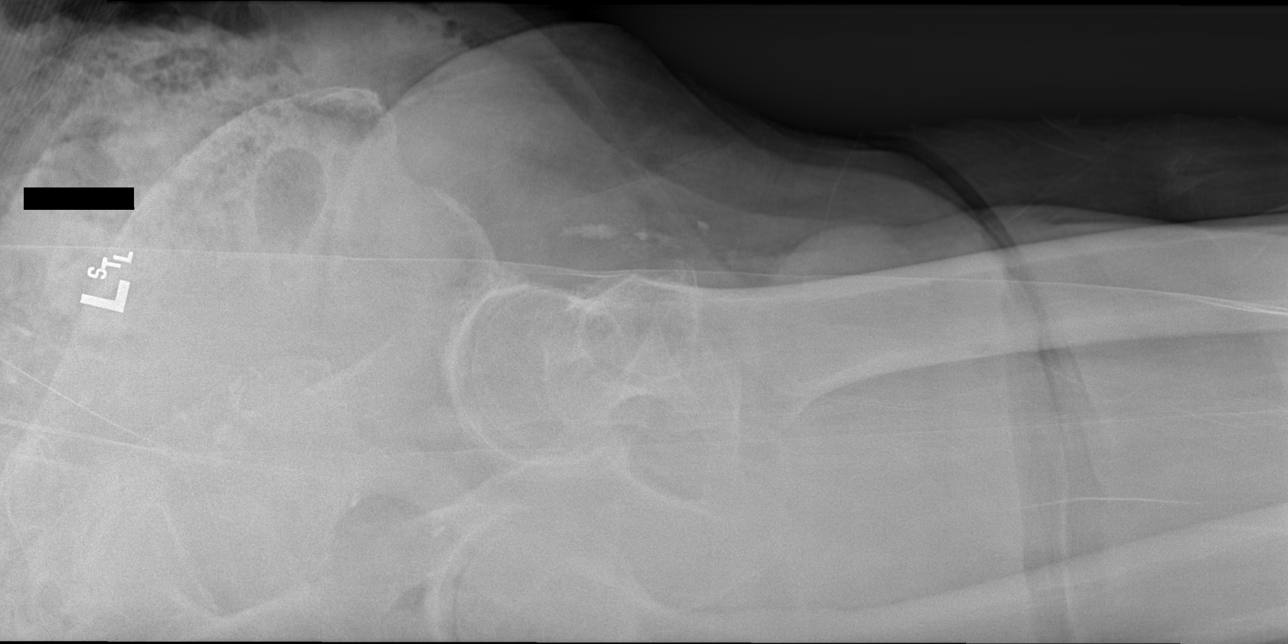

[4 of 4 positions shown; findings below may reference images not displayed]

FINDINGS: There is no evidence of fracture or other focal bone lesions.
Generalized osteopenia noted. Mild peripheral vascular calcification
also seen.
IMPRESSION: No acute findings.

## 2019-09-17 DIAGNOSIS — M4856XA Collapsed vertebra, not elsewhere classified, lumbar region, initial encounter for fracture: Secondary | ICD-10-CM | POA: Diagnosis not present

## 2019-09-17 DIAGNOSIS — M545 Low back pain: Secondary | ICD-10-CM | POA: Diagnosis not present

## 2019-09-18 DIAGNOSIS — Z1231 Encounter for screening mammogram for malignant neoplasm of breast: Secondary | ICD-10-CM | POA: Diagnosis not present

## 2019-09-18 LAB — HM MAMMOGRAPHY

## 2019-09-27 ENCOUNTER — Encounter: Payer: Self-pay | Admitting: Adult Health

## 2019-09-27 ENCOUNTER — Other Ambulatory Visit: Payer: Self-pay

## 2019-09-27 ENCOUNTER — Ambulatory Visit (INDEPENDENT_AMBULATORY_CARE_PROVIDER_SITE_OTHER): Payer: Medicare PPO | Admitting: Adult Health

## 2019-09-27 VITALS — BP 129/80 | HR 73 | Temp 98.5°F | Ht 60.75 in | Wt 146.4 lb

## 2019-09-27 DIAGNOSIS — R4182 Altered mental status, unspecified: Secondary | ICD-10-CM | POA: Diagnosis not present

## 2019-09-27 DIAGNOSIS — W19XXXA Unspecified fall, initial encounter: Secondary | ICD-10-CM | POA: Diagnosis not present

## 2019-09-27 DIAGNOSIS — M25471 Effusion, right ankle: Secondary | ICD-10-CM | POA: Diagnosis not present

## 2019-09-27 DIAGNOSIS — Z0000 Encounter for general adult medical examination without abnormal findings: Secondary | ICD-10-CM

## 2019-09-27 DIAGNOSIS — Z87891 Personal history of nicotine dependence: Secondary | ICD-10-CM | POA: Diagnosis not present

## 2019-09-27 DIAGNOSIS — R627 Adult failure to thrive: Secondary | ICD-10-CM

## 2019-09-27 DIAGNOSIS — F339 Major depressive disorder, recurrent, unspecified: Secondary | ICD-10-CM | POA: Diagnosis not present

## 2019-09-27 DIAGNOSIS — R8290 Unspecified abnormal findings in urine: Secondary | ICD-10-CM

## 2019-09-27 DIAGNOSIS — M25472 Effusion, left ankle: Secondary | ICD-10-CM

## 2019-09-27 DIAGNOSIS — I10 Essential (primary) hypertension: Secondary | ICD-10-CM

## 2019-09-27 LAB — POCT URINALYSIS DIPSTICK
Bilirubin, UA: NEGATIVE
Glucose, UA: NEGATIVE
Nitrite, UA: NEGATIVE
Protein, UA: POSITIVE — AB
Spec Grav, UA: 1.025 (ref 1.010–1.025)
Urobilinogen, UA: 0.2 E.U./dL
pH, UA: 6.5 (ref 5.0–8.0)

## 2019-09-27 LAB — POCT GLYCOSYLATED HEMOGLOBIN (HGB A1C): Hemoglobin A1C: 5.5 % (ref 4.0–5.6)

## 2019-09-27 MED ORDER — CIPROFLOXACIN HCL 250 MG PO TABS: 250.0000 mg | ORAL_TABLET | Freq: Two times a day (BID) | ORAL | 0 refills | Status: DC

## 2019-09-27 NOTE — Progress Notes (Signed)
CT abdo  Subjective:    Patient ID: Christina Ortiz, female    DOB: May 25, 1929, 85 y.o.   MRN: [id number]  HPI:07/16/2019 OV: Ms.  Ortiz presents for regular f/u:afib (on xarelto), CAD s/p stents in the RCA and LAD in 2006, mobitz type 1 second degree AV block, HTN.CKD, Hypothyroidism, Heavy Alcohol Use -She has been also educated about limiting alcohol and NSAID use.  HTN, PAF,  She reports recent lower ext swelling for the last month- this is a chronic issue, may be r/t to CCB use-currently on Amlodipine 10mg  QD She reports ambulatory readings- SBP 110-120s DBP 70-80s She denies dizziness with position changes She denies recent falls She reports reduction in vodka drinking, denies daily use. She states "I don't buy the big bottle any more". She reports increase in chronic lumbago, she denies regular exercise but is very active - house/yard work During this pandemic she has- Waxed all the floors in her house and outdoor furniture She has remained home, only ventured out for groceries and medical appt's-always worn a mask outside of her home She denies acute cardiac sx's  07/05/2019 Labs- Thyroid Panel- WNL  No change to current Levothyroxine dosage  A1c-WNL, 5.5  CMP-stable  GFR 44, slight decreased from ten months ago  CBC-stable  Lipid Panel- HDL-57 LDL-75  01/26/2019 Card/Phd Notes 1. Hypertension - Blood pressureis at goal in clinic today at<130/80. Home readings are approaching goal. Offered to lower amlodipine dose to reduce swelling but daughter did not want to change what was working and patient states she is fine with the amount of swelling. Will follow up in 2 months to make sure blood pressure is still in goal range.   09/27/2019 OV:  Christina Ortiz presents with poor appetite, early satiety, increased fatigue that last 4/5 days. Per daughter (who is at [facility]), pt consumed 5th of vodka >2 weeks ago and feel late in evening, "Life Alert"  system was triggered and her husband went over to help her. She has not had vodka in the home since then and will only have one  "watered down" drink at her daughter's house when she does over for a family dinner. Per there daughter, there is about 1.5 oz of vodka in 4 oz water. Christina Ortiz reports becoming quite full only after 2-3 bites of a meal. She also reports increased fatigue, mild nausea without vomiting, and increased GERD sx's. She has been experiencing urinary frequency, however denies dysuria or flank pain. She denies change in bowel/bladder habits. She denies hematuria/hematochezia. She denies denies fever/night sweats. She has lost 1 lbs since last OV in system- current wt 146 Body mass index is 27.89 kg/m.  She is down 5 lbs since her last OV here in July 2020. She reports more difficulty remembering things. She does not drive anymore, daughter takes her to all appt's and outings. She still feels safe at home. She reports home is free of tripping hazards. She very infrequently uses her walker- discussed at length that she should use walker around home consistently and either walker or cane when out running errands. She has baseline HOH but it was markedly increased- ears examined- bil cerumen impactions- lavaged and hering dramatically improved.   Thyroid panel 06/2019- normal- currently on Levothyroxine 07/2019 QD Patient Care Team    Relationship Specialty Notifications Start End  D, NP PCP - General Family Medicine  03/30/17   05/30/17, MD PCP - Cardiology Cardiology  08/14/19  Christina Flow, MD Consulting Physician Ophthalmology  03/30/17   [facility]    03/30/17   Chilton Greathouse, MD Consulting Physician Pulmonary Disease  04/27/18     Patient Active Problem List   Diagnosis Date Noted  . Abnormal urinalysis 09/28/2019  . Adult failure to thrive 09/28/2019  . Dysphagia 09/06/2018  . Pain   . Closed displaced  fracture of greater trochanter of left femur (HCC) 06/04/2018  . Hypoxia 06/03/2018  . Hospital discharge follow-up 04/27/2018  . Fall 03/31/2018  . Humerus shaft fracture-right 03/31/2018  . Rhabdomyolysis 03/31/2018  . Hypothermia 03/31/2018  . Healthcare maintenance 01/17/2018  . Nocturia 01/17/2018  . Macrocytosis 11/22/2017  . Acute bronchitis due to respiratory syncytial virus (RSV) 11/09/2017  . Acute respiratory failure with hypoxia (HCC) 11/09/2017  . Hypertension 11/09/2017  . PAF (paroxysmal atrial fibrillation) (HCC) 11/09/2017  . Coronary artery disease 11/09/2017  . CKD (chronic kidney disease) stage 2, GFR 60-89 ml/min 11/09/2017  . Hypothyroidism 11/09/2017  . Dyspnea 11/08/2017  . Upper respiratory tract infection 11/08/2017  . Elevated serum creatinine 06/21/2017  . Edema of both ankles 06/21/2017  . Diarrhea 05/25/2017  . Hypokalemia 05/25/2017  . Alcohol abuse 05/25/2017  . Depression, recurrent (HCC) 03/30/2017  . Lumbago 03/30/2017  . Influenza with respiratory manifestation 01/22/2017  . Coronary artery disease due to lipid rich plaque 04/04/2015  . Essential hypertension 04/04/2015  . Hyperlipidemia 04/04/2015  . Former smoker 04/04/2015  . Chronic anticoagulation 04/04/2015     Past Medical History:  Diagnosis Date  . Acute bronchitis 10/2017  . Arthritis   . CKD (chronic kidney disease)    Xarelto dose is 15 mg QD  . Coronary artery disease    a. s/p PCI/stenting to RCA and LAD ([facility] in [place]) in 2006; b. Nuc 1/15: No ischemia, EF 70%  . Depression   . Gallstones   . Gastritis   . Hiatal hernia   . Hip fracture (HCC)   . History of cholecystectomy   . History of echocardiogram    a. Echo 1/15: Moderate LVH, EF 55-60%, impaired relaxation, mild AS  . Hyperlipidemia   . Hypertension   . Hypokalemia   . Mobitz type 1 second degree atrioventricular block    a. Event Monitor 3/15: NSR, first-degree AV block, second-degree AB block  - Mobitz 1, PACs, NSVT (5 beats)  . PAF (paroxysmal atrial fibrillation) (HCC)   . Thyroid disease      Past Surgical History:  Procedure Laterality Date  . ANGIOPLASTY     with sent placement  . BLEPHAROPLASTY    . BREAST LUMPECTOMY Left   . CARDIAC CATHETERIZATION    . CATARACT EXTRACTION Bilateral   . CHOLECYSTECTOMY    . DILATION AND CURETTAGE OF UTERUS       Family History  Problem Relation Age of Onset  . Heart disease Mother   . Heart attack Mother   . Lung cancer Father   . Lymphoma Brother   . Heart failure Brother   . Esophageal cancer Paternal Uncle   . Colon cancer Neg Hx   . Colon polyps Neg Hx   . Rectal cancer Neg Hx   . Stomach cancer Neg Hx      Social History   Substance and Sexual Activity  Drug Use No     Social History   Substance and Sexual Activity  Alcohol Use Yes  . Alcohol/week: 10.0 standard drinks  . Types: 10 Shots of  liquor per week   Comment: 7 a week     Social History   Tobacco Use  Smoking Status Former Smoker  . Packs/day: 0.50  . Years: 67.00  . Pack years: 33.50  . Types: Cigarettes  . Quit date: 10/24/2014  . Years since quitting: 4.9  Smokeless Tobacco Never Used  Tobacco Comment   quit 2015     Outpatient Encounter Medications as of 09/27/2019  Medication Sig  . acetaminophen (TYLENOL) 325 MG tablet Take 650 mg by mouth every 6 (six) hours as needed.  Marland Kitchen amLODipine (NORVASC) 5 MG tablet Take 1 tablet (5 mg total) by mouth daily.  Marland Kitchen antiseptic oral rinse (BIOTENE) LIQD 15 mLs by Mouth Rinse route as needed for dry mouth.  . cholecalciferol (VITAMIN D) 1000 units tablet Take 1,000 Units by mouth every evening.  . folic acid (FOLVITE) 1 MG tablet TAKE 1 TABLET BY MOUTH DAILY  . furosemide (LASIX) 20 MG tablet Take 1 tablet (20 mg total) by mouth daily.  Marland Kitchen ipratropium-albuterol (DUONEB) 0.5-2.5 (3) MG/3ML SOLN Take 3 mLs by nebulization as needed.   Marland Kitchen levothyroxine (SYNTHROID) 75 MCG tablet TAKE 1 TABLET BY  MOUTH DAILY BEFORE BREAKFAST.  . magnesium oxide (MAG-OX) 400 MG tablet Take 400 mg by mouth daily.  . metoprolol tartrate (LOPRESSOR) 25 MG tablet Take 0.5 tablets (12.5 mg total) by mouth 2 (two) times daily.  . multivitamin (PROSIGHT) TABS tablet Take 1 tablet by mouth daily.  Marland Kitchen omeprazole (PRILOSEC) 20 MG capsule TAKE 1 CAPSULE BY MOUTH DAILY AS NEEDED FOR HEARTBURN.  Marland Kitchen PARoxetine (PAXIL) 40 MG tablet Take 1 tablet (40 mg total) by mouth daily.  . polyethylene glycol (MIRALAX / GLYCOLAX) packet Take 17 g by mouth daily.  . potassium chloride SA (K-DUR) 20 MEQ tablet Take 1 tablet (20 mEq total) by mouth daily.  Marland Kitchen Propylene Glycol (SYSTANE BALANCE) 0.6 % SOLN Place 1 drop into both eyes daily.  . quinapril (ACCUPRIL) 40 MG tablet TAKE 1 TABLET (40 MG TOTAL) BY MOUTH DAILY.  . simvastatin (ZOCOR) 5 MG tablet TAKE 1 TABLET BY MOUTH EVERY EVENING.  Marland Kitchen tolterodine (DETROL LA) 4 MG 24 hr capsule Take 1 capsule by mouth daily.  Alveda Reasons 15 MG TABS tablet TAKE 1 TABLET (15 MG TOTAL) BY MOUTH DAILY WITH SUPPER.  . ciprofloxacin (CIPRO) 250 MG tablet Take 1 tablet (250 mg total) by mouth 2 (two) times daily.   No facility-administered encounter medications on file as of 09/27/2019.     Allergies: Tape  Body mass index is 27.89 kg/m.  Blood pressure 129/80, pulse 73, temperature 98.5 F (36.9 C), temperature source Oral, height 5' 0.75" (1.543 m), weight 146 lb 6.4 oz (66.4 kg), SpO2 95 %.   Review of Systems  Constitutional: Positive for activity change, appetite change and fatigue. Negative for chills, diaphoresis, fever and unexpected weight change.  HENT: Positive for hearing loss. Negative for congestion.   Eyes: Negative for visual disturbance.  Respiratory: Negative for cough, chest tightness, shortness of breath, wheezing and stridor.   Cardiovascular: Negative for chest pain, palpitations and leg swelling.  Gastrointestinal: Negative for abdominal distention, abdominal pain, blood  in stool, constipation, diarrhea, nausea and vomiting.  Endocrine: Negative for cold intolerance, heat intolerance, polydipsia, polyphagia and polyuria.  Genitourinary: Positive for frequency and urgency. Negative for dysuria.  Musculoskeletal: Positive for arthralgias, back pain, gait problem, joint swelling, myalgias, neck pain and neck stiffness.  Skin: Negative for color change, pallor, rash and wound.  Neurological: Negative for dizziness and headaches.  Hematological: Negative for adenopathy. Does not bruise/bleed easily.  Psychiatric/Behavioral: Positive for dysphoric mood and sleep disturbance. Negative for agitation, behavioral problems, confusion, decreased concentration, hallucinations, self-injury and suicidal ideas. The patient is not nervous/anxious and is not hyperactive.        Objective:   Physical Exam Vitals signs and nursing note reviewed.  Constitutional:      General: She is not in acute distress.    Appearance: Normal appearance. She is normal weight. She is not ill-appearing, toxic-appearing or diaphoretic.  HENT:     Head: Normocephalic and atraumatic.     Right Ear: Decreased hearing noted. There is impacted cerumen. Tympanic membrane is not erythematous or bulging.     Left Ear: Decreased hearing noted. There is impacted cerumen. Tympanic membrane is not erythematous or bulging.     Ears:     Comments: Bil cerumen impaction Ears lavaged- large amount of cerumen removed  Hearing improved dramatically with lavage  Cardiovascular:     Rate and Rhythm: Normal rate and regular rhythm.     Pulses: Normal pulses.     Heart sounds: Normal heart sounds. No murmur. No friction rub. No gallop.   Pulmonary:     Effort: Pulmonary effort is normal.     Breath sounds: Normal breath sounds.  Abdominal:     General: Bowel sounds are decreased. There is no distension.     Palpations: Abdomen is soft.     Tenderness: There is no abdominal tenderness. There is no right CVA  tenderness, left CVA tenderness or guarding.  Musculoskeletal:     Right lower leg: No edema.     Comments: Slow, guarded ambulation   Skin:    General: Skin is warm and dry.     Capillary Refill: Capillary refill takes less than 2 seconds.  Neurological:     Mental Status: She is alert and oriented to person, place, and time.  Psychiatric:        Mood and Affect: Mood normal.        Behavior: Behavior normal.        Thought Content: Thought content normal.        Judgment: Judgment normal.        Assessment & Plan:   1. Failure to thrive in adult   2. Altered mental status, unspecified altered mental status type   3. Depression, recurrent (HCC)   4. Healthcare maintenance   5. Abnormal urinalysis   6. Adult failure to thrive   7. Edema of both ankles   8. Former smoker   9. Essential hypertension   10. Fall, initial encounter     Healthcare maintenance Follow-up here in 4-6 weeks. Continue to social distance and wear a mask when in public. Call clinic with any questions/concerns.  Depression, recurrent (HCC) Paroxetine 40mg  QD Referral to BH/psycology placed Pt denies SI/HI Keep ETOH use to a min Do not consume ANY ETOH by herself at home anymore  Edema of both ankles None at present  Former smoker She denies current tobacco use  Abnormal urinalysis UA: Blood-trace Nit- neg Leu- Trace No flank pain With CKD will use renal Cipro dose Cipro 250mg  BID x 3 days Urine C/S sent Remain well hydrated  Essential hypertension BP 129/80 HR 73 Amlodipine 5mg  QD Furosemide 20mg  QD- Pot Chloride  20 mEq QD Metoprolol 25mg - 1/2 tab BID Quinapril 40mg  QD  Adult failure to thrive 1) Abnormal Urinalysis- started on Ciprofloxacin.  Will contact you with  culture/sensitivity results. Remain well hydrated. 2) Recommend Boost Shakes. Keep alcohol use to minimum- only at daughter's house. 3) We will call you with lab results. 4) Imaging ordered, someone will call  you to schedule- will likely need Prior Authorization before it can be completed. 5) Referral to Psychology/Therapy placed, someone from that office will call you to schedule and appt. 6) Follow-up here in 4-6 weeks. 7) Continue to social distance and wear a mask when in public. 8) Call clinic with any questions/concerns.    FOLLOW-UP:  Return in about 4 weeks (around 10/25/2019).

## 2019-09-27 NOTE — Assessment & Plan Note (Addendum)
Follow-up here in 4-6 weeks. Continue to social distance and wear a mask when in public. Call clinic with any questions/concerns.

## 2019-09-27 NOTE — Patient Instructions (Addendum)
Failure to Thrive, Adult Failure to thrive is a group of symptoms that affect adults, including the elderly. These symptoms include loss of appetite and weight loss. People who have this condition may do fewer and fewer activities over time. They may lose interest in being with friends, or they may not want to eat or drink. This condition is not a normal part of aging. What are the causes? This condition may be caused by:  A disease, such as dementia, diabetes, cancer, or lung disease.  A health problem, such as a vitamin deficiency or a heart problem.  A disorder, such as depression.  A disability.  Medicines.  Having tooth or mouth problems.  Mistreatment or neglect. In some cases, the cause may not be known. What are the signs or symptoms? Symptoms of this condition include:  Loss of more than 5% of your body weight.  Being more tired than normal after an activity.  Having trouble getting up after sitting.  Loss of appetite.  Not getting out of bed.  Not wanting to do usual activities.  Depression.  Getting infections often.  Bedsores.  Taking a long time to recover after an infection, injury, or a surgery.  Weakness. How is this diagnosed? This condition may be diagnosed with a health history, physical exam, and tests. Your health care provider will ask questions about your health, behavior, and mood, such as:  Has your activity changed?  Do you seem sad?  Are your eating habits different? Tests may also be done. They may include:  Blood tests.  Urine tests.  Imaging tests, such as: ? X-rays. ? CT scan. ? MRI.  Hearing tests.  Vision tests.  Tests to check thinking ability (cognitive tests).  Activity tests. Your health care provider may want to see if you can do tasks such as bathing and dressing and if you can move around safely. You may be referred to a specialist. How is this treated? Treatment for this condition depends on the cause. It  may be treated by:  Treating a disease or disorder that is causing symptoms.  Having talk therapy or taking medicine to treat depression.  Improving diet, such as by eating more often or taking nutritional supplements.  Changing or stopping a medicine.  Having physical or occupational therapy. It often takes a team of health care providers to find the right treatment. Follow these instructions at home:   Take over-the-counter and prescription medicines only as told by your health care provider.  Eat a healthy, well-balanced diet. Make sure to get enough calories in each meal. Ask your health care provider how many calories you need.  Be physically active. Include strength training as part of your exercise routine. A physical therapist can help to set up an exercise program that is right for you.  Make sure that you are safe at home.  Have a plan for what to do if you become unable to make decisions for yourself. Contact a health care provider if you:  Are not able to eat well.  Are not able to move around.  Feel very sad or hopeless. Get help right away if:  You have thoughts of ending your life.  You cannot eat or drink.  You do not get out of bed.  Staying at home is no longer safe.  You have a fever. Summary  Failure to thrive is a group of symptoms that affect adults, including the elderly.  Symptoms include loss of appetite and weight loss.    Take over-the-counter and prescription medicines only as told by your health care provider.  Eat a healthy, well-balanced diet. Make sure to get enough calories in each meal. Ask your health care provider how many calories you need.  Be physically active. Include strength training as part of your exercise routine. A physical therapist can help to set up an exercise program that is right for you. This information is not intended to replace advice given to you by your health care provider. Make sure you discuss any  questions you have with your health care provider. Document Released: 03/06/2012 Document Revised: 08/15/2018 Document Reviewed: 08/15/2018 Elsevier Patient Education  [place].  1) Abnormal Urinalysis- started on Ciprofloxacin.  Will contact your culture/sensitivity results. Remain well hydrated. 2) Recommend Boost Shakes. Keep alcohol use to minimum- only at daughter's house. 3) We will call you with lab results. 4) Imaging ordered, someone will call you to schedule- will likely need Prior Authorization before it can be completed. 5) Referral to Psychology/Therapy placed, someone from that office will call you to schedule and appt. 6) Follow-up here in 4-6 weeks. 7) Continue to social distance and wear a mask when in public. 8) Call clinic with any questions/concerns.

## 2019-09-28 ENCOUNTER — Telehealth: Payer: Self-pay

## 2019-09-28 ENCOUNTER — Encounter: Payer: Self-pay | Admitting: Adult Health

## 2019-09-28 DIAGNOSIS — R627 Adult failure to thrive: Secondary | ICD-10-CM | POA: Insufficient documentation

## 2019-09-28 DIAGNOSIS — R8290 Unspecified abnormal findings in urine: Secondary | ICD-10-CM | POA: Insufficient documentation

## 2019-09-28 LAB — CBC WITH DIFFERENTIAL/PLATELET
Basophils Absolute: 0 10*3/uL (ref 0.0–0.2)
Basos: 0 %
EOS (ABSOLUTE): 0.2 10*3/uL (ref 0.0–0.4)
Eos: 1 %
Hematocrit: 43.9 % (ref 34.0–46.6)
Hemoglobin: 15.1 g/dL (ref 11.1–15.9)
Immature Grans (Abs): 0.1 10*3/uL (ref 0.0–0.1)
Immature Granulocytes: 1 %
Lymphocytes Absolute: 1.6 10*3/uL (ref 0.7–3.1)
Lymphs: 13 %
MCH: 33.4 pg — ABNORMAL HIGH (ref 26.6–33.0)
MCHC: 34.4 g/dL (ref 31.5–35.7)
MCV: 97 fL (ref 79–97)
Monocytes Absolute: 1.2 10*3/uL — ABNORMAL HIGH (ref 0.1–0.9)
Monocytes: 10 %
Neutrophils Absolute: 9.2 10*3/uL — ABNORMAL HIGH (ref 1.4–7.0)
Neutrophils: 75 %
Platelets: 288 10*3/uL (ref 150–450)
RBC: 4.52 x10E6/uL (ref 3.77–5.28)
RDW: 12.5 % (ref 11.7–15.4)
WBC: 12.3 10*3/uL — ABNORMAL HIGH (ref 3.4–10.8)

## 2019-09-28 LAB — COMPREHENSIVE METABOLIC PANEL
ALT: 6 IU/L (ref 0–32)
AST: 18 IU/L (ref 0–40)
Albumin/Globulin Ratio: 1.5 (ref 1.2–2.2)
Albumin: 4.5 g/dL (ref 3.6–4.6)
Alkaline Phosphatase: 145 IU/L — ABNORMAL HIGH (ref 39–117)
BUN/Creatinine Ratio: 15 (ref 12–28)
BUN: 17 mg/dL (ref 8–27)
Bilirubin Total: 0.8 mg/dL (ref 0.0–1.2)
CO2: 28 mmol/L (ref 20–29)
Calcium: 10.2 mg/dL (ref 8.7–10.3)
Chloride: 96 mmol/L (ref 96–106)
Creatinine, Ser: 1.16 mg/dL — ABNORMAL HIGH (ref 0.57–1.00)
GFR calc Af Amer: 49 mL/min/{1.73_m2} — ABNORMAL LOW (ref >59)
GFR calc non Af Amer: 42 mL/min/{1.73_m2} — ABNORMAL LOW (ref >59)
Globulin, Total: 3 g/dL (ref 1.5–4.5)
Glucose: 94 mg/dL (ref 65–99)
Potassium: 3.7 mmol/L (ref 3.5–5.2)
Sodium: 138 mmol/L (ref 134–144)
Total Protein: 7.5 g/dL (ref 6.0–8.5)

## 2019-09-28 LAB — MAGNESIUM: Magnesium: 1.7 mg/dL (ref 1.6–2.3)

## 2019-09-28 LAB — C-REACTIVE PROTEIN: CRP: 6 mg/L (ref 0–10)

## 2019-09-28 LAB — SEDIMENTATION RATE: Sed Rate: 26 mm/hr (ref 0–40)

## 2019-09-28 NOTE — Assessment & Plan Note (Signed)
She denies current tobacco use

## 2019-09-28 NOTE — Assessment & Plan Note (Signed)
Paroxetine 40mg  QD Referral to BH/psycology placed Pt denies SI/HI Keep ETOH use to a min Do not consume ANY ETOH by herself at home anymore

## 2019-09-28 NOTE — Telephone Encounter (Signed)
Called [employer] for CT authorization.  Transferred to [place] vendor [phone].  Call reference # [id number].  Under clinical review.  Fax from insurance to be filled out with more clinical information.  Fax# [phone].  Case tracking #[id number].

## 2019-09-28 NOTE — Assessment & Plan Note (Signed)
1) Abnormal Urinalysis- started on Ciprofloxacin.  Will contact you with  culture/sensitivity results. Remain well hydrated. 2) Recommend Boost Shakes. Keep alcohol use to minimum- only at daughter's house. 3) We will call you with lab results. 4) Imaging ordered, someone will call you to schedule- will likely need Prior Authorization before it can be completed. 5) Referral to Psychology/Therapy placed, someone from that office will call you to schedule and appt. 6) Follow-up here in 4-6 weeks. 7) Continue to social distance and wear a mask when in public. 8) Call clinic with any questions/concerns.

## 2019-09-28 NOTE — Assessment & Plan Note (Signed)
None at present  

## 2019-09-28 NOTE — Assessment & Plan Note (Signed)
UA: Blood-trace Nit- neg Leu- Trace No flank pain With CKD will use renal Cipro dose Cipro 250mg  BID x 3 days Urine C/S sent Remain well hydrated

## 2019-09-28 NOTE — Assessment & Plan Note (Signed)
BP 129/80 HR 73 Amlodipine 5mg  QD Furosemide 20mg  QD- Pot Chloride  20 mEq QD Metoprolol 25mg - 1/2 tab BID Quinapril 40mg  QD

## 2019-09-30 ENCOUNTER — Encounter: Payer: Self-pay | Admitting: Adult Health

## 2019-09-30 LAB — CULTURE, URINE COMPREHENSIVE

## 2019-10-01 ENCOUNTER — Telehealth: Payer: Self-pay

## 2019-10-01 NOTE — Telephone Encounter (Signed)
-----   Message from Esaw Grandchild, NP sent at 09/30/2019  9:23 AM EDT ----- Kermit Balo Morning Christina Ortiz, I sent a MyChart message, but can you also please call the pt and inform her that her urine C/S showed low levels of bacteria. She probably already completed the short 3 day course of cipro, but if not- please tell her that it is okay to stop the ABX. Please ask how she is feeling and if she needs anything. Please let her know that we are working on the PA for her CT scan and hopefully someone from imaging will call in a few days to schedule. Thanks! Valetta Fuller

## 2019-10-01 NOTE — Telephone Encounter (Signed)
Patient had finished antibiotic.  Patient is feeling better.  Family does not want to proceed with CT at this time.

## 2019-10-09 ENCOUNTER — Other Ambulatory Visit: Payer: Self-pay | Admitting: Adult Health

## 2019-10-16 ENCOUNTER — Inpatient Hospital Stay (HOSPITAL_COMMUNITY)
Admission: EM | Admit: 2019-10-16 | Discharge: 2019-10-19 | DRG: 446 | Disposition: A | Discharge status: Home / Self Care | Payer: Medicare PPO | Attending: Internal Medicine | Admitting: Internal Medicine

## 2019-10-16 ENCOUNTER — Encounter (HOSPITAL_COMMUNITY): Payer: Self-pay

## 2019-10-16 ENCOUNTER — Emergency Department (HOSPITAL_COMMUNITY): Payer: Medicare PPO

## 2019-10-16 ENCOUNTER — Other Ambulatory Visit: Payer: Self-pay

## 2019-10-16 DIAGNOSIS — K8070 Calculus of gallbladder and bile duct without cholecystitis without obstruction: Secondary | ICD-10-CM | POA: Diagnosis not present

## 2019-10-16 DIAGNOSIS — Z801 Family history of malignant neoplasm of trachea, bronchus and lung: Secondary | ICD-10-CM

## 2019-10-16 DIAGNOSIS — I441 Atrioventricular block, second degree: Secondary | ICD-10-CM | POA: Diagnosis not present

## 2019-10-16 DIAGNOSIS — Z9049 Acquired absence of other specified parts of digestive tract: Secondary | ICD-10-CM

## 2019-10-16 DIAGNOSIS — Z800 Family history of malignant neoplasm of digestive organs: Secondary | ICD-10-CM

## 2019-10-16 DIAGNOSIS — N183 Chronic kidney disease, stage 3 unspecified: Secondary | ICD-10-CM | POA: Diagnosis not present

## 2019-10-16 DIAGNOSIS — Z8249 Family history of ischemic heart disease and other diseases of the circulatory system: Secondary | ICD-10-CM | POA: Diagnosis not present

## 2019-10-16 DIAGNOSIS — I480 Paroxysmal atrial fibrillation: Secondary | ICD-10-CM | POA: Diagnosis present

## 2019-10-16 DIAGNOSIS — Z807 Family history of other malignant neoplasms of lymphoid, hematopoietic and related tissues: Secondary | ICD-10-CM | POA: Diagnosis not present

## 2019-10-16 DIAGNOSIS — I2510 Atherosclerotic heart disease of native coronary artery without angina pectoris: Secondary | ICD-10-CM | POA: Diagnosis not present

## 2019-10-16 DIAGNOSIS — Z79890 Hormone replacement therapy (postmenopausal): Secondary | ICD-10-CM

## 2019-10-16 DIAGNOSIS — R000 Tachycardia, unspecified: Secondary | ICD-10-CM | POA: Diagnosis not present

## 2019-10-16 DIAGNOSIS — F329 Major depressive disorder, single episode, unspecified: Secondary | ICD-10-CM | POA: Diagnosis present

## 2019-10-16 DIAGNOSIS — R0902 Hypoxemia: Secondary | ICD-10-CM | POA: Diagnosis not present

## 2019-10-16 DIAGNOSIS — M1990 Unspecified osteoarthritis, unspecified site: Secondary | ICD-10-CM | POA: Diagnosis present

## 2019-10-16 DIAGNOSIS — Z20828 Contact with and (suspected) exposure to other viral communicable diseases: Secondary | ICD-10-CM | POA: Diagnosis not present

## 2019-10-16 DIAGNOSIS — Z87891 Personal history of nicotine dependence: Secondary | ICD-10-CM

## 2019-10-16 DIAGNOSIS — E039 Hypothyroidism, unspecified: Secondary | ICD-10-CM | POA: Diagnosis present

## 2019-10-16 DIAGNOSIS — K449 Diaphragmatic hernia without obstruction or gangrene: Secondary | ICD-10-CM | POA: Diagnosis present

## 2019-10-16 DIAGNOSIS — Z91048 Other nonmedicinal substance allergy status: Secondary | ICD-10-CM

## 2019-10-16 DIAGNOSIS — Z955 Presence of coronary angioplasty implant and graft: Secondary | ICD-10-CM | POA: Diagnosis not present

## 2019-10-16 DIAGNOSIS — I129 Hypertensive chronic kidney disease with stage 1 through stage 4 chronic kidney disease, or unspecified chronic kidney disease: Secondary | ICD-10-CM | POA: Diagnosis present

## 2019-10-16 DIAGNOSIS — F419 Anxiety disorder, unspecified: Secondary | ICD-10-CM | POA: Diagnosis present

## 2019-10-16 DIAGNOSIS — I10 Essential (primary) hypertension: Secondary | ICD-10-CM | POA: Diagnosis present

## 2019-10-16 DIAGNOSIS — Z9861 Coronary angioplasty status: Secondary | ICD-10-CM

## 2019-10-16 DIAGNOSIS — Z7901 Long term (current) use of anticoagulants: Secondary | ICD-10-CM

## 2019-10-16 DIAGNOSIS — F339 Major depressive disorder, recurrent, unspecified: Secondary | ICD-10-CM | POA: Diagnosis present

## 2019-10-16 DIAGNOSIS — K219 Gastro-esophageal reflux disease without esophagitis: Secondary | ICD-10-CM | POA: Diagnosis present

## 2019-10-16 DIAGNOSIS — K8050 Calculus of bile duct without cholangitis or cholecystitis without obstruction: Secondary | ICD-10-CM

## 2019-10-16 DIAGNOSIS — R0602 Shortness of breath: Secondary | ICD-10-CM | POA: Diagnosis not present

## 2019-10-16 DIAGNOSIS — J90 Pleural effusion, not elsewhere classified: Secondary | ICD-10-CM | POA: Diagnosis not present

## 2019-10-16 DIAGNOSIS — E876 Hypokalemia: Secondary | ICD-10-CM | POA: Diagnosis not present

## 2019-10-16 DIAGNOSIS — E785 Hyperlipidemia, unspecified: Secondary | ICD-10-CM | POA: Diagnosis present

## 2019-10-16 DIAGNOSIS — I2583 Coronary atherosclerosis due to lipid rich plaque: Secondary | ICD-10-CM | POA: Diagnosis not present

## 2019-10-16 DIAGNOSIS — R531 Weakness: Secondary | ICD-10-CM | POA: Diagnosis not present

## 2019-10-16 DIAGNOSIS — I4891 Unspecified atrial fibrillation: Secondary | ICD-10-CM | POA: Diagnosis not present

## 2019-10-16 DIAGNOSIS — R7989 Other specified abnormal findings of blood chemistry: Secondary | ICD-10-CM

## 2019-10-16 DIAGNOSIS — R0689 Other abnormalities of breathing: Secondary | ICD-10-CM | POA: Diagnosis not present

## 2019-10-16 DIAGNOSIS — K5710 Diverticulosis of small intestine without perforation or abscess without bleeding: Secondary | ICD-10-CM | POA: Diagnosis present

## 2019-10-16 LAB — CBC WITH DIFFERENTIAL/PLATELET
Abs Immature Granulocytes: 0.62 10*3/uL — ABNORMAL HIGH (ref 0.00–0.07)
Basophils Absolute: 0 10*3/uL (ref 0.0–0.1)
Basophils Relative: 0 %
Eosinophils Absolute: 0.1 10*3/uL (ref 0.0–0.5)
Eosinophils Relative: 0 %
HCT: 37.5 % (ref 36.0–46.0)
Hemoglobin: 12.6 g/dL (ref 12.0–15.0)
Immature Granulocytes: 3 %
Lymphocytes Relative: 4 %
Lymphs Abs: 0.8 10*3/uL (ref 0.7–4.0)
MCH: 34 pg (ref 26.0–34.0)
MCHC: 33.6 g/dL (ref 30.0–36.0)
MCV: 101.1 fL — ABNORMAL HIGH (ref 80.0–100.0)
Monocytes Absolute: 0.5 10*3/uL (ref 0.1–1.0)
Monocytes Relative: 2 %
Neutro Abs: 16.8 10*3/uL — ABNORMAL HIGH (ref 1.7–7.7)
Neutrophils Relative %: 91 %
Platelets: 200 10*3/uL (ref 150–400)
RBC: 3.71 MIL/uL — ABNORMAL LOW (ref 3.87–5.11)
RDW: 15.2 % (ref 11.5–15.5)
WBC: 18.8 10*3/uL — ABNORMAL HIGH (ref 4.0–10.5)
nRBC: 0 % (ref 0.0–0.2)

## 2019-10-16 LAB — COMPREHENSIVE METABOLIC PANEL
ALT: 76 U/L — ABNORMAL HIGH (ref 0–44)
AST: 139 U/L — ABNORMAL HIGH (ref 15–41)
Albumin: 3.3 g/dL — ABNORMAL LOW (ref 3.5–5.0)
Alkaline Phosphatase: 377 U/L — ABNORMAL HIGH (ref 38–126)
Anion gap: 15 (ref 5–15)
BUN: 36 mg/dL — ABNORMAL HIGH (ref 8–23)
CO2: 26 mmol/L (ref 22–32)
Calcium: 8.6 mg/dL — ABNORMAL LOW (ref 8.9–10.3)
Chloride: 95 mmol/L — ABNORMAL LOW (ref 98–111)
Creatinine, Ser: 1.04 mg/dL — ABNORMAL HIGH (ref 0.44–1.00)
GFR calc Af Amer: 56 mL/min — ABNORMAL LOW (ref >60)
GFR calc non Af Amer: 48 mL/min — ABNORMAL LOW (ref >60)
Glucose, Bld: 104 mg/dL — ABNORMAL HIGH (ref 70–99)
Potassium: 4.8 mmol/L (ref 3.5–5.1)
Sodium: 136 mmol/L (ref 135–145)
Total Bilirubin: 6.4 mg/dL — ABNORMAL HIGH (ref 0.3–1.2)
Total Protein: 6.6 g/dL (ref 6.5–8.1)

## 2019-10-16 LAB — LIPASE, BLOOD: Lipase: 17 U/L (ref 11–51)

## 2019-10-16 MED ORDER — SODIUM CHLORIDE 0.9 % IV BOLUS
500.0000 mL | Freq: Once | INTRAVENOUS | Status: AC
  Administered 2019-10-16: 500 mL via INTRAVENOUS

## 2019-10-16 MED ORDER — ALBUTEROL SULFATE HFA 108 (90 BASE) MCG/ACT IN AERS
2.0000 | INHALATION_SPRAY | RESPIRATORY_TRACT | Status: DC | PRN
  Administered 2019-10-16: 2 via RESPIRATORY_TRACT
  Filled 2019-10-16: qty 6.7

## 2019-10-16 MED ORDER — SODIUM CHLORIDE (PF) 0.9 % IJ SOLN
INTRAMUSCULAR | Status: AC
  Administered 2019-10-16: 19:00:00
  Filled 2019-10-16: qty 50

## 2019-10-16 MED ORDER — IOHEXOL 300 MG/ML  SOLN
100.0000 mL | Freq: Once | INTRAMUSCULAR | Status: AC | PRN
  Administered 2019-10-16: 100 mL via INTRAVENOUS

## 2019-10-16 MED ORDER — PIPERACILLIN-TAZOBACTAM 3.375 G IVPB 30 MIN
3.3750 g | Freq: Once | INTRAVENOUS | Status: AC
  Administered 2019-10-16: 3.375 g via INTRAVENOUS
  Filled 2019-10-16: qty 50

## 2019-10-16 MED ORDER — HYDRALAZINE HCL 20 MG/ML IJ SOLN
10.0000 mg | Freq: Once | INTRAMUSCULAR | Status: AC
  Administered 2019-10-16: 10 mg via INTRAVENOUS
  Filled 2019-10-16: qty 1

## 2019-10-16 NOTE — ED Provider Notes (Signed)
[facility]-EMERGENCY DEPT Provider Note   CSN: [id number] Arrival date & time: 10/16/19  1643     History   Chief Complaint Chief Complaint  Patient presents with   Weakness    HPI Christina Ortiz is a 85 y.o. female.     HPI Patient lives alone.  States she has had generalized weakness and decreased appetite for the past 4 days.  She denies any recent falls.  Denies new cough.  No chest pain or abdominal pain.  Denies urinary symptoms.  Denies lateralizing weakness. Past Medical History:  Diagnosis Date   Acute bronchitis 10/2017   Arthritis    CKD (chronic kidney disease)    Xarelto dose is 15 mg QD   Coronary artery disease    a. s/p PCI/stenting to RCA and LAD ([facility] in [place]) in 2006; b. Nuc 1/15: No ischemia, EF 70%   Depression    Gallstones    Gastritis    Hiatal hernia    Hip fracture (HCC)    History of cholecystectomy    History of echocardiogram    a. Echo 1/15: Moderate LVH, EF 55-60%, impaired relaxation, mild AS   Hyperlipidemia    Hypertension    Hypokalemia    Mobitz type 1 second degree atrioventricular block    a. Event Monitor 3/15: NSR, first-degree AV block, second-degree AB block - Mobitz 1, PACs, NSVT (5 beats)   PAF (paroxysmal atrial fibrillation) (HCC)    Thyroid disease     Patient Active Problem List   Diagnosis Date Noted   Choledocholithiasis 10/16/2019   Abnormal urinalysis 09/28/2019   Adult failure to thrive 09/28/2019   Dysphagia 09/06/2018   Pain    Closed displaced fracture of greater trochanter of left femur (HCC) 06/04/2018   Hypoxia 06/03/2018   Hospital discharge follow-up 04/27/2018   Fall 03/31/2018   Humerus shaft fracture-right 03/31/2018   Rhabdomyolysis 03/31/2018   Hypothermia 03/31/2018   Healthcare maintenance 01/17/2018   Nocturia 01/17/2018   Macrocytosis 11/22/2017   Acute bronchitis due to respiratory syncytial virus (RSV) 11/09/2017     Acute respiratory failure with hypoxia (HCC) 11/09/2017   Hypertension 11/09/2017   PAF (paroxysmal atrial fibrillation) (HCC) 11/09/2017   Coronary artery disease 11/09/2017   CKD (chronic kidney disease) stage 2, GFR 60-89 ml/min 11/09/2017   Hypothyroidism 11/09/2017   Dyspnea 11/08/2017   Upper respiratory tract infection 11/08/2017   Elevated serum creatinine 06/21/2017   Edema of both ankles 06/21/2017   Diarrhea 05/25/2017   Hypokalemia 05/25/2017   Alcohol abuse 05/25/2017   Depression, recurrent (HCC) 03/30/2017   Lumbago 03/30/2017   Influenza with respiratory manifestation 01/22/2017   Coronary artery disease due to lipid rich plaque 04/04/2015   Essential hypertension 04/04/2015   Hyperlipidemia 04/04/2015   Former smoker 04/04/2015   Chronic anticoagulation 04/04/2015    Past Surgical History:  Procedure Laterality Date   ANGIOPLASTY     with sent placement   BLEPHAROPLASTY     BREAST LUMPECTOMY Left    CARDIAC CATHETERIZATION     CATARACT EXTRACTION Bilateral    CHOLECYSTECTOMY     DILATION AND CURETTAGE OF UTERUS       OB History   No obstetric history on file.      Home Medications    Prior to Admission medications   Medication Sig Start Date End Date Taking? Authorizing Provider  acetaminophen (TYLENOL) 325 MG tablet Take 650 mg by mouth every 6 (six) hours as needed for  fever.    Yes [provider]  XARELTO 15 MG TABS tablet TAKE 1 TABLET (15 MG TOTAL) BY MOUTH DAILY WITH SUPPER. Patient taking differently: Take 15 mg by mouth daily with supper.  01/19/19  Yes Jerline Pain, MD  amLODipine (NORVASC) 5 MG tablet Take 1 tablet (5 mg total) by mouth daily. 08/03/19 08/02/20  Jerline Pain, MD  antiseptic oral rinse (BIOTENE) LIQD 15 mLs by Mouth Rinse route as needed for dry mouth.    [provider]  calcitonin, salmon, (MIACALCIN/FORTICAL) 200 UNIT/ACT nasal spray  09/17/19   [provider]   celecoxib (CELEBREX) 200 MG capsule  08/29/19   [provider]  cholecalciferol (VITAMIN D) 1000 units tablet Take 1,000 Units by mouth every evening.    [provider]  ciprofloxacin (CIPRO) 250 MG tablet Take 1 tablet (250 mg total) by mouth 2 (two) times daily. 09/27/19   Danford, Valetta Fuller D, NP  folic acid (FOLVITE) 1 MG tablet TAKE 1 TABLET BY MOUTH DAILY 09/04/19   Danford, Valetta Fuller D, NP  furosemide (LASIX) 20 MG tablet Take 1 tablet (20 mg total) by mouth daily. 08/10/19   Jerline Pain, MD  ipratropium-albuterol (DUONEB) 0.5-2.5 (3) MG/3ML SOLN Take 3 mLs by nebulization as needed.     [provider]  levothyroxine (SYNTHROID) 75 MCG tablet TAKE 1 TABLET BY MOUTH DAILY BEFORE BREAKFAST. 10/10/19   Danford, Valetta Fuller D, NP  magnesium oxide (MAG-OX) 400 MG tablet Take 400 mg by mouth daily.    [provider]  metoprolol tartrate (LOPRESSOR) 25 MG tablet Take 0.5 tablets (12.5 mg total) by mouth 2 (two) times daily. 07/18/19   Danford, Valetta Fuller D, NP  multivitamin (PROSIGHT) TABS tablet Take 1 tablet by mouth daily. 06/08/18   Dana Allan I, MD  omeprazole (PRILOSEC) 20 MG capsule TAKE 1 CAPSULE BY MOUTH DAILY AS NEEDED FOR HEARTBURN. 09/04/19   Danford, Valetta Fuller D, NP  oxybutynin (DITROPAN-XL) 10 MG 24 hr tablet  08/29/19   [provider]  PARoxetine (PAXIL) 40 MG tablet Take 1 tablet (40 mg total) by mouth daily. 07/26/19   Danford, Valetta Fuller D, NP  polyethylene glycol (MIRALAX / GLYCOLAX) packet Take 17 g by mouth daily.    [provider]  potassium chloride SA (K-DUR) 20 MEQ tablet Take 1 tablet (20 mEq total) by mouth daily. 08/10/19   Jerline Pain, MD  Propylene Glycol (SYSTANE BALANCE) 0.6 % SOLN Place 1 drop into both eyes daily.    [provider]  quinapril (ACCUPRIL) 40 MG tablet TAKE 1 TABLET (40 MG TOTAL) BY MOUTH DAILY. 05/14/19   Danford, Valetta Fuller D, NP  simvastatin (ZOCOR) 5 MG tablet TAKE 1 TABLET BY MOUTH EVERY EVENING. 10/10/19   Danford,  [name], NP  tolterodine (DETROL LA) 4 MG 24 hr capsule Take 1 capsule by mouth daily. 07/11/19   [provider]  traMADol Veatrice Bourbon) 50 MG tablet  08/29/19   [provider]    Family History Family History  Problem Relation Age of Onset   Heart disease Mother    Heart attack Mother    Lung cancer Father    Lymphoma Brother    Heart failure Brother    Esophageal cancer Paternal Uncle    Colon cancer Neg Hx    Colon polyps Neg Hx    Rectal cancer Neg Hx    Stomach cancer Neg Hx     Social History Social History   Tobacco Use  Smoking status: Former Smoker    Packs/day: 0.50    Years: 67.00    Pack years: 33.50    Types: Cigarettes    Quit date: 10/24/2014    Years since quitting: 4.9   Smokeless tobacco: Never Used   Tobacco comment: quit 2015  Substance Use Topics   Alcohol use: Yes    Alcohol/week: 10.0 standard drinks    Types: 10 Shots of liquor per week    Comment: 7 a week   Drug use: No     Allergies   Tape   Review of Systems Review of Systems  Constitutional: Positive for appetite change and fatigue. Negative for chills and fever.  HENT: Negative for sore throat.   Eyes: Negative for visual disturbance.  Respiratory: Negative for cough and shortness of breath.   Cardiovascular: Negative for chest pain.  Gastrointestinal: Negative for abdominal pain, diarrhea and nausea.  Genitourinary: Negative for dysuria, flank pain and frequency.  Musculoskeletal: Negative for back pain, myalgias, neck pain and neck stiffness.  Skin: Negative for rash and wound.  Neurological: Positive for weakness. Negative for dizziness, speech difficulty, light-headedness, numbness and headaches.  All other systems reviewed and are negative.    Physical Exam Updated Vital Signs BP (!) 180/118    Pulse 82    Temp (!) 97.4 F (36.3 C) (Oral)    Resp 16    Ht 5\' 1"  (1.549 m)    Wt 65 kg    SpO2 99%    BMI 27.08 kg/m   Physical Exam Vitals  signs and nursing note reviewed.  Constitutional:      General: She is not in acute distress.    Appearance: Normal appearance. She is well-developed.  HENT:     Head: Normocephalic and atraumatic.     Mouth/Throat:     Mouth: Mucous membranes are dry.  Eyes:     Extraocular Movements: Extraocular movements intact.     Pupils: Pupils are equal, round, and reactive to light.  Neck:     Musculoskeletal: Normal range of motion and neck supple. No neck rigidity or muscular tenderness.  Cardiovascular:     Rate and Rhythm: Normal rate. Rhythm irregular.  Pulmonary:     Effort: Pulmonary effort is normal.     Breath sounds: Wheezing present.     Comments: Mild tachypnea.  Expiratory wheezing throughout. Abdominal:     General: Bowel sounds are normal. There is no distension.     Palpations: Abdomen is soft. There is no mass.     Tenderness: There is no abdominal tenderness. There is no guarding or rebound.     Hernia: No hernia is present.  Musculoskeletal: Normal range of motion.        General: No swelling, tenderness, deformity or signs of injury.     Right lower leg: No edema.     Left lower leg: No edema.  Lymphadenopathy:     Cervical: No cervical adenopathy.  Skin:    General: Skin is warm and dry.     Capillary Refill: Capillary refill takes less than 2 seconds.     Findings: No erythema or rash.  Neurological:     General: No focal deficit present.     Mental Status: She is alert and oriented to person, place, and time.     Comments: 5/5 motor in all extremities.  Sensation fully intact.  Psychiatric:        Behavior: Behavior normal.      ED Treatments /  Results  Labs (all labs ordered are listed, but only abnormal results are displayed) Labs Reviewed  CBC WITH DIFFERENTIAL/PLATELET - Abnormal; Notable for the following components:      Result Value   WBC 18.8 (*)    RBC 3.71 (*)    MCV 101.1 (*)    Neutro Abs 16.8 (*)    Abs Immature Granulocytes 0.62 (*)      All other components within normal limits  COMPREHENSIVE METABOLIC PANEL - Abnormal; Notable for the following components:   Chloride 95 (*)    Glucose, Bld 104 (*)    BUN 36 (*)    Creatinine, Ser 1.04 (*)    Calcium 8.6 (*)    Albumin 3.3 (*)    AST 139 (*)    ALT 76 (*)    Alkaline Phosphatase 377 (*)    Total Bilirubin 6.4 (*)    GFR calc non Af Amer 48 (*)    GFR calc Af Amer 56 (*)    All other components within normal limits  SARS CORONAVIRUS 2 (TAT 6-24 HRS)  LIPASE, BLOOD  TSH  URINALYSIS, ROUTINE W REFLEX MICROSCOPIC    EKG EKG Interpretation  Date/Time:  Tuesday October 16 2019 17:54:53 EDT Ventricular Rate:  82 PR Interval:    QRS Duration: 95 QT Interval:  423 QTC Calculation: 495 R Axis:   118 Text Interpretation:  Atrial fibrillation Left posterior fascicular block Borderline T abnormalities, anterior leads Borderline prolonged QT interval Confirmed by Loren RacerYelverton, Geralda Baumgardner ([id number]) on 10/16/2019 9:03:40 PM   Radiology Ct Chest W Contrast  Result Date: 10/16/2019 CLINICAL DATA:  Four days weakness. Loss of appetite. Possible jaundice. History urinary tract infections. EXAM: CT CHEST, ABDOMEN, AND PELVIS WITH CONTRAST TECHNIQUE: Multidetector CT imaging of the chest, abdomen and pelvis was performed following the standard protocol during bolus administration of intravenous contrast. CONTRAST:  100mL OMNIPAQUE IOHEXOL 300 MG/ML  SOLN COMPARISON:  Chest radiograph 10/16/2019.  Pelvic CT 06/03/2018. FINDINGS: CT CHEST FINDINGS Cardiovascular: Advanced aortic and branch vessel atherosclerosis. Tortuous thoracic aorta. Moderate cardiomegaly with trace pericardial fluid, likely physiologic. Multivessel coronary artery atherosclerosis. No central pulmonary embolism, on this non-dedicated study. Mediastinum/Nodes: No mediastinal or hilar adenopathy. Large hiatal hernia, with greater than 2/3 of the stomach positioned within the chest. Lungs/Pleura: Small right pleural  effusion. Trace left pleural thickening. Mild motion and is EKG lead artifact degradation. Moderate centrilobular emphysema. Bibasilar atelectasis. Probable calcified granuloma in the left lower lobe on 69/4. Scattered bilateral pulmonary nodules. Left upper lobe pulmonary nodule of 4 mm on 51/3. A likely calcified right upper lobe pulmonary nodule of 4 mm on 55/4. 4 mm nodule along the right minor fissure is positioned within the right middle lobe and may also be calcified. Musculoskeletal: No acute osseous abnormality. Remote right lower rib fractures. Advanced thoracolumbar spondylosis CT ABDOMEN PELVIS FINDINGS Hepatobiliary: Mild technique limitations secondary to motion and EKG leads continues into the upper abdomen. No focal liver lesion. Cholecystectomy. Mild intrahepatic duct dilatation. The common duct is borderline dilated for prior cholecystectomy state, 11 mm. There are noncalcified common duct stones including at 11 mm on 66/2 and 8 mm on 67/2. Also coronal images 44 and 47. There is also likely an 8 mm stone just above the ampulla on 71/2. Pancreas: Moderate pancreatic atrophy with multiple cystic foci including at up to 9 mm on 59/2. No acute inflammation. Spleen: Normal in size, without focal abnormality. Adrenals/Urinary Tract: Normal adrenal glands. Mild bilateral renal scarring. Lower pole left renal  cyst. Bilateral too small to characterize renal lesions. No hydronephrosis. Normal urinary bladder. Stomach/Bowel: Normal distal stomach. Scattered colonic diverticula. Normal terminal ileum. Normal small bowel. Vascular/Lymphatic: Advanced aortic and branch vessel atherosclerosis. No abdominopelvic adenopathy. Reproductive: Normal uterus and adnexa.  Tubal ligation. Other: No significant free fluid.  Mild pelvic floor laxity. Musculoskeletal: No acute osseous abnormality. IMPRESSION: 1. Extensive choledocholithiasis, with borderline to mild duct dilatation after cholecystectomy. Recommend ERCP. 2.  Motion and artifact degradation, as detailed above. 3. Bilateral pulmonary nodules, primarily felt to represent calcified granulomas. Given patient age, these are of questionable clinical significance. 4. Coronary artery atherosclerosis. Aortic Atherosclerosis (ICD10-I70.0). 5. Large hiatal hernia. 6. Small right pleural effusion. 7. Pancreatic atrophy with multiple cystic foci within, most likely related to side branch ductectasia or pseudocyst. Electronically Signed   By: Jeronimo Greaves M.D.   On: 10/16/2019 20:01   Ct Abdomen Pelvis W Contrast  Result Date: 10/16/2019 CLINICAL DATA:  Four days weakness. Loss of appetite. Possible jaundice. History urinary tract infections. EXAM: CT CHEST, ABDOMEN, AND PELVIS WITH CONTRAST TECHNIQUE: Multidetector CT imaging of the chest, abdomen and pelvis was performed following the standard protocol during bolus administration of intravenous contrast. CONTRAST:  OMNIPAQUE IOHEXOL 300 MG/ML  SOLN COMPARISON:  Chest radiograph 10/16/2019.  Pelvic CT 06/03/2018. FINDINGS: CT CHEST FINDINGS Cardiovascular: Advanced aortic and branch vessel atherosclerosis. Tortuous thoracic aorta. Moderate cardiomegaly with trace pericardial fluid, likely physiologic. Multivessel coronary artery atherosclerosis. No central pulmonary embolism, on this non-dedicated study. Mediastinum/Nodes: No mediastinal or hilar adenopathy. Large hiatal hernia, with greater than 2/3 of the stomach positioned within the chest. Lungs/Pleura: Small right pleural effusion. Trace left pleural thickening. Mild motion and is EKG lead artifact degradation. Moderate centrilobular emphysema. Bibasilar atelectasis. Probable calcified granuloma in the left lower lobe on 69/4. Scattered bilateral pulmonary nodules. Left upper lobe pulmonary nodule of 4 mm on 51/3. A likely calcified right upper lobe pulmonary nodule of 4 mm on 55/4. 4 mm nodule along the right minor fissure is positioned within the right middle lobe  and may also be calcified. Musculoskeletal: No acute osseous abnormality. Remote right lower rib fractures. Advanced thoracolumbar spondylosis CT ABDOMEN PELVIS FINDINGS Hepatobiliary: Mild technique limitations secondary to motion and EKG leads continues into the upper abdomen. No focal liver lesion. Cholecystectomy. Mild intrahepatic duct dilatation. The common duct is borderline dilated for prior cholecystectomy state, 11 mm. There are noncalcified common duct stones including at 11 mm on 66/2 and 8 mm on 67/2. Also coronal images 44 and 47. There is also likely an 8 mm stone just above the ampulla on 71/2. Pancreas: Moderate pancreatic atrophy with multiple cystic foci including at up to 9 mm on 59/2. No acute inflammation. Spleen: Normal in size, without focal abnormality. Adrenals/Urinary Tract: Normal adrenal glands. Mild bilateral renal scarring. Lower pole left renal cyst. Bilateral too small to characterize renal lesions. No hydronephrosis. Normal urinary bladder. Stomach/Bowel: Normal distal stomach. Scattered colonic diverticula. Normal terminal ileum. Normal small bowel. Vascular/Lymphatic: Advanced aortic and branch vessel atherosclerosis. No abdominopelvic adenopathy. Reproductive: Normal uterus and adnexa.  Tubal ligation. Other: No significant free fluid.  Mild pelvic floor laxity. Musculoskeletal: No acute osseous abnormality. IMPRESSION: 1. Extensive choledocholithiasis, with borderline to mild duct dilatation after cholecystectomy. Recommend ERCP. 2. Motion and artifact degradation, as detailed above. 3. Bilateral pulmonary nodules, primarily felt to represent calcified granulomas. Given patient age, these are of questionable clinical significance. 4. Coronary artery atherosclerosis. Aortic Atherosclerosis (ICD10-I70.0). 5. Large hiatal hernia. 6. Small right pleural  effusion. 7. Pancreatic atrophy with multiple cystic foci within, most likely related to side branch ductectasia or pseudocyst.  Electronically Signed   By: Jeronimo GreavesKyle  Talbot M.D.   On: 10/16/2019 20:01   Dg Chest Port 1 View  Result Date: 10/16/2019 CLINICAL DATA:  Shortness of breath. EXAM: PORTABLE CHEST 1 VIEW COMPARISON:  06/06/2018 FINDINGS: There is chronic marked cardiomegaly. Pulmonary vascularity is normal. Aortic atherosclerosis. Double density at the right heart border may represent a pericardial cyst but appears unchanged. The possibility of a large hiatal hernia should also be considered. There is slight atelectasis at the lung bases which is new since the prior study. No acute bone abnormality. IMPRESSION: 1. New slight atelectasis at the lung bases. 2. Chronic marked cardiomegaly. 3. Possible pericardial cyst at the right heart border. 4. Aortic atherosclerosis. Electronically Signed   By: Francene BoyersJames  Maxwell M.D.   On: 10/16/2019 18:22    Procedures Procedures (including critical care time)  Medications Ordered in ED Medications  albuterol (VENTOLIN HFA) 108 (90 Base) MCG/ACT inhaler 2 puff (2 puffs Inhalation Given 10/16/19 1753)  sodium chloride (PF) 0.9 % injection (has no administration in time range)  hydrALAZINE (APRESOLINE) injection 10 mg (has no administration in time range)  piperacillin-tazobactam (ZOSYN) IVPB 3.375 g (has no administration in time range)  sodium chloride 0.9 % bolus 500 mL (500 mLs Intravenous New Bag/Given (Non-Interop) 10/16/19 1811)  iohexol (OMNIPAQUE) 300 MG/ML solution 100 mL (100 mLs Intravenous Contrast Given 10/16/19 1923)     Initial Impression / Assessment and Plan / ED Course  I have reviewed the triage vital signs and the nursing notes.  Pertinent labs & imaging results that were available during my care of the patient were reviewed by me and considered in my medical decision making (see chart for details).       Discussed CT findings with Dr. Bing MatterGanem, [facility].  Recommends starting patient on Zosyn and holding her Xarelto.  They will consult on the  patient in the morning for ERCP.  Spoke with hospitalist who will see patient emergency department and admit.   Final Clinical Impressions(s) / ED Diagnoses   Final diagnoses:  Choledocholithiasis    ED Discharge Orders    None       Loren RacerYelverton, Derrica Sieg, MD 10/16/19 2104

## 2019-10-16 NOTE — H&P (Signed)
History and Physical   Christina Ortiz POL:[id number] DOB: 11/29/1929 DOA: 10/16/2019  Referring MD/NP/PA: Dr. Ranae Palms  PCP: Julaine Fusi, NP   Outpatient Specialists: Claudette Head, MD gastroenterology  Patient coming from: Home  Chief Complaint: Abdominal pain and weakness  HPI: Christina Ortiz is a 85 y.o. female with medical history significant of chronic kidney disease stage III, osteoarthritis, paroxysmal atrial fibrillation on Xarelto, coronary artery disease, history of cholecystectomy, GERD, hypertension, hyperlipidemia who presents to the ER with generalized weakness as well as abdominal pain.  Pain is mainly in the epigastric region.  Associated with some nausea but no vomiting.  Denied any fever or chills.  Patient has had symptoms ongoing for about a week.  She has no urinary symptoms.  Denied any melena no bright red blood per rectum.  Patient's work-up showed increased LFTs including total bilirubin of more than 6.  Choledocholithiasis on imaging.  Patient being admitted with GI consultation..  ED Course: Temperature 98 temperature 97 for blood pressure 187/112 pulse 95, respiratory rate of 16 oxygen sat 93% room air.  White count is 18.8 hemoglobin 12.6 and platelet count of 200.  Sodium 136 potassium 4.8 chloride 95 CO2 26 BUN 36 creatinine 1.04 and calcium 8.6.  LFTs showed AST of 139 ALT of 76 total bilirubin of 6.4.  CT abdomen pelvis showed extensive choledocholithiasis with borderline to mild duct dilatation.  Bilateral pulmonary nodules and coronary arteries atherosclerosis.  Small right pleural effusions.  GI consulted and patient being admitted for treatment  Review of Systems: As per HPI otherwise 10 point review of systems negative.    Past Medical History:  Diagnosis Date   Acute bronchitis 10/2017   Arthritis    CKD (chronic kidney disease)    Xarelto dose is 15 mg QD   Coronary artery disease    a. s/p PCI/stenting to RCA and LAD ([facility]  in [place]) in 2006; b. Nuc 1/15: No ischemia, EF 70%   Depression    Gallstones    Gastritis    Hiatal hernia    Hip fracture (HCC)    History of cholecystectomy    History of echocardiogram    a. Echo 1/15: Moderate LVH, EF 55-60%, impaired relaxation, mild AS   Hyperlipidemia    Hypertension    Hypokalemia    Mobitz type 1 second degree atrioventricular block    a. Event Monitor 3/15: NSR, first-degree AV block, second-degree AB block - Mobitz 1, PACs, NSVT (5 beats)   PAF (paroxysmal atrial fibrillation) (HCC)    Thyroid disease     Past Surgical History:  Procedure Laterality Date   ANGIOPLASTY     with sent placement   BLEPHAROPLASTY     BREAST LUMPECTOMY Left    CARDIAC CATHETERIZATION     CATARACT EXTRACTION Bilateral    CHOLECYSTECTOMY     DILATION AND CURETTAGE OF UTERUS       reports that she quit smoking about 4 years ago. Her smoking use included cigarettes. She has a 33.50 pack-year smoking history. She has never used smokeless tobacco. She reports current alcohol use of about 10.0 standard drinks of alcohol per week. She reports that she does not use drugs.  Allergies  Allergen Reactions   Tape Other (See Comments)    Patient's skin is thin and will TEAR AND BRUISE EASILY    Family History  Problem Relation Age of Onset   Heart disease Mother    Heart attack Mother    Lung  cancer Father    Lymphoma Brother    Heart failure Brother    Esophageal cancer Paternal Uncle    Colon cancer Neg Hx    Colon polyps Neg Hx    Rectal cancer Neg Hx    Stomach cancer Neg Hx      Prior to Admission medications   Medication Sig Start Date End Date Taking? Authorizing Provider  acetaminophen (TYLENOL) 325 MG tablet Take 650 mg by mouth every 6 (six) hours as needed for fever.    Yes [provider]  XARELTO 15 MG TABS tablet TAKE 1 TABLET (15 MG TOTAL) BY MOUTH DAILY WITH SUPPER. Patient taking differently: Take 15 mg by mouth  daily with supper.  01/19/19  Yes Jake BatheSkains, Mark C, MD  amLODipine (NORVASC) 5 MG tablet Take 1 tablet (5 mg total) by mouth daily. 08/03/19 08/02/20  Jake BatheSkains, Mark C, MD  antiseptic oral rinse (BIOTENE) LIQD 15 mLs by Mouth Rinse route as needed for dry mouth.    [provider]  calcitonin, salmon, (MIACALCIN/FORTICAL) 200 UNIT/ACT nasal spray  09/17/19   [provider]  celecoxib (CELEBREX) 200 MG capsule  08/29/19   [provider]  cholecalciferol (VITAMIN D) 1000 units tablet Take 1,000 Units by mouth every evening.    [provider]  ciprofloxacin (CIPRO) 250 MG tablet Take 1 tablet (250 mg total) by mouth 2 (two) times daily. 09/27/19   Danford, Orpha BurKaty D, NP  folic acid (FOLVITE) 1 MG tablet TAKE 1 TABLET BY MOUTH DAILY 09/04/19   Danford, Orpha BurKaty D, NP  furosemide (LASIX) 20 MG tablet Take 1 tablet (20 mg total) by mouth daily. 08/10/19   Jake BatheSkains, Mark C, MD  ipratropium-albuterol (DUONEB) 0.5-2.5 (3) MG/3ML SOLN Take 3 mLs by nebulization as needed.     [provider]  levothyroxine (SYNTHROID) 75 MCG tablet TAKE 1 TABLET BY MOUTH DAILY BEFORE BREAKFAST. 10/10/19   Danford, Orpha BurKaty D, NP  magnesium oxide (MAG-OX) 400 MG tablet Take 400 mg by mouth daily.    [provider]  metoprolol tartrate (LOPRESSOR) 25 MG tablet Take 0.5 tablets (12.5 mg total) by mouth 2 (two) times daily. 07/18/19   Danford, Orpha BurKaty D, NP  multivitamin (PROSIGHT) TABS tablet Take 1 tablet by mouth daily. 06/08/18   Berton Mountgbata, Sylvester I, MD  omeprazole (PRILOSEC) 20 MG capsule TAKE 1 CAPSULE BY MOUTH DAILY AS NEEDED FOR HEARTBURN. 09/04/19   Danford, Orpha BurKaty D, NP  oxybutynin (DITROPAN-XL) 10 MG 24 hr tablet  08/29/19   [provider]  PARoxetine (PAXIL) 40 MG tablet Take 1 tablet (40 mg total) by mouth daily. 07/26/19   Danford, Orpha BurKaty D, NP  polyethylene glycol (MIRALAX / GLYCOLAX) packet Take 17 g by mouth daily.    [provider]  potassium chloride SA (K-DUR) 20 MEQ tablet  Take 1 tablet (20 mEq total) by mouth daily. 08/10/19   Jake BatheSkains, Mark C, MD  Propylene Glycol (SYSTANE BALANCE) 0.6 % SOLN Place 1 drop into both eyes daily.    [provider]  quinapril (ACCUPRIL) 40 MG tablet TAKE 1 TABLET (40 MG TOTAL) BY MOUTH DAILY. 05/14/19   Danford, Orpha BurKaty D, NP  simvastatin (ZOCOR) 5 MG tablet TAKE 1 TABLET BY MOUTH EVERY EVENING. 10/10/19   Danford, [name], NP  tolterodine (DETROL LA) 4 MG 24 hr capsule Take 1 capsule by mouth daily. 07/11/19   [provider]  traMADol Janean Sark(ULTRAM) 50 MG tablet  08/29/19   [provider]  Physical Exam: Vitals:   10/16/19 1730 10/16/19 1800 10/16/19 1830 10/16/19 1900  BP: (!) 187/112 (!) 186/115 (!) 180/115 (!) 180/118  Pulse: 81 80 81 82  Resp: (!) 33 (!) 26 (!) 26 16  Temp:      TempSrc:      SpO2: 93% 97% 97% 99%  Weight:      Height:          Constitutional: NAD, calm, comfortable Vitals:   10/16/19 1730 10/16/19 1800 10/16/19 1830 10/16/19 1900  BP: (!) 187/112 (!) 186/115 (!) 180/115 (!) 180/118  Pulse: 81 80 81 82  Resp: (!) 33 (!) 26 (!) 26 16  Temp:      TempSrc:      SpO2: 93% 97% 97% 99%  Weight:      Height:       Eyes: PERRL, lids and conjunctivae normal ENMT: Mucous membranes are moist. Posterior pharynx clear of any exudate or lesions.Normal dentition.  Neck: normal, supple, no masses, no thyromegaly Respiratory: clear to auscultation bilaterally, no wheezing, no crackles. Normal respiratory effort. No accessory muscle use.  Cardiovascular: Regular rate and rhythm, no murmurs / rubs / gallops. No extremity edema. 2+ pedal pulses. No carotid bruits.  Abdomen: Epigastric to right upper quadrant abdominal tenderness, no masses palpated. No hepatosplenomegaly. Bowel sounds positive.  Musculoskeletal: no clubbing / cyanosis. No joint deformity upper and lower extremities. Good ROM, no contractures. Normal muscle tone.  Skin: no rashes, lesions, ulcers. No induration Neurologic: CN  2-12 grossly intact. Sensation intact, DTR normal. Strength 5/5 in all 4.  Psychiatric: Normal judgment and insight. Alert and oriented x 3. Normal mood.     Labs on Admission: I have personally reviewed following labs and imaging studies  CBC: Recent Labs  Lab 10/16/19 1819  WBC 18.8*  NEUTROABS 16.8*  HGB 12.6  HCT 37.5  MCV 101.1*  PLT 440   Basic Metabolic Panel: Recent Labs  Lab 10/16/19 1819  NA 136  K 4.8  CL 95*  CO2 26  GLUCOSE 104*  BUN 36*  CREATININE 1.04*  CALCIUM 8.6*   GFR: Estimated Creatinine Clearance: 32.3 mL/min (A) (by C-G formula based on SCr of 1.04 mg/dL (H)). Liver Function Tests: Recent Labs  Lab 10/16/19 1819  AST 139*  ALT 76*  ALKPHOS 377*  BILITOT 6.4*  PROT 6.6  ALBUMIN 3.3*   Recent Labs  Lab 10/16/19 1819  LIPASE 17   No results for input(s): AMMONIA in the last 168 hours. Coagulation Profile: No results for input(s): INR, PROTIME in the last 168 hours. Cardiac Enzymes: No results for input(s): CKTOTAL, CKMB, CKMBINDEX, TROPONINI in the last 168 hours. BNP (last 3 results) No results for input(s): PROBNP in the last 8760 hours. HbA1C: No results for input(s): HGBA1C in the last 72 hours. CBG: No results for input(s): GLUCAP in the last 168 hours. Lipid Profile: No results for input(s): CHOL, HDL, LDLCALC, TRIG, CHOLHDL, LDLDIRECT in the last 72 hours. Thyroid Function Tests: No results for input(s): TSH, T4TOTAL, FREET4, T3FREE, THYROIDAB in the last 72 hours. Anemia Panel: No results for input(s): VITAMINB12, FOLATE, FERRITIN, TIBC, IRON, RETICCTPCT in the last 72 hours. Urine analysis:    Component Value Date/Time   COLORURINE YELLOW 05/23/2017 0125   APPEARANCEUR HAZY (A) 05/23/2017 0125   LABSPEC 1.006 05/23/2017 0125   PHURINE 7.0 05/23/2017 0125   GLUCOSEU NEGATIVE 05/23/2017 0125   HGBUR SMALL (A) 05/23/2017 0125   BILIRUBINUR negative 09/27/2019 1041   KETONESUR NEGATIVE  05/23/2017 0125   PROTEINUR  Positive (A) 09/27/2019 1041   PROTEINUR 30 (A) 05/23/2017 0125   UROBILINOGEN 0.2 09/27/2019 1041   UROBILINOGEN 1.0 04/12/2017 1157   NITRITE negative 09/27/2019 1041   NITRITE NEGATIVE 05/23/2017 0125   LEUKOCYTESUR Trace (A) 09/27/2019 1041   Sepsis Labs: @LABRCNTIP (procalcitonin:4,lacticidven:4) )No results found for this or any previous visit (from the past 240 hour(s)).   Radiological Exams on Admission: Ct Chest W Contrast  Result Date: 10/16/2019 CLINICAL DATA:  Four days weakness. Loss of appetite. Possible jaundice. History urinary tract infections. EXAM: CT CHEST, ABDOMEN, AND PELVIS WITH CONTRAST TECHNIQUE: Multidetector CT imaging of the chest, abdomen and pelvis was performed following the standard protocol during bolus administration of intravenous contrast. CONTRAST:  100mL OMNIPAQUE IOHEXOL 300 MG/ML  SOLN COMPARISON:  Chest radiograph 10/16/2019.  Pelvic CT 06/03/2018. FINDINGS: CT CHEST FINDINGS Cardiovascular: Advanced aortic and branch vessel atherosclerosis. Tortuous thoracic aorta. Moderate cardiomegaly with trace pericardial fluid, likely physiologic. Multivessel coronary artery atherosclerosis. No central pulmonary embolism, on this non-dedicated study. Mediastinum/Nodes: No mediastinal or hilar adenopathy. Large hiatal hernia, with greater than 2/3 of the stomach positioned within the chest. Lungs/Pleura: Small right pleural effusion. Trace left pleural thickening. Mild motion and is EKG lead artifact degradation. Moderate centrilobular emphysema. Bibasilar atelectasis. Probable calcified granuloma in the left lower lobe on 69/4. Scattered bilateral pulmonary nodules. Left upper lobe pulmonary nodule of 4 mm on 51/3. A likely calcified right upper lobe pulmonary nodule of 4 mm on 55/4. 4 mm nodule along the right minor fissure is positioned within the right middle lobe and may also be calcified. Musculoskeletal: No acute osseous abnormality. Remote right lower rib  fractures. Advanced thoracolumbar spondylosis CT ABDOMEN PELVIS FINDINGS Hepatobiliary: Mild technique limitations secondary to motion and EKG leads continues into the upper abdomen. No focal liver lesion. Cholecystectomy. Mild intrahepatic duct dilatation. The common duct is borderline dilated for prior cholecystectomy state, 11 mm. There are noncalcified common duct stones including at 11 mm on 66/2 and 8 mm on 67/2. Also coronal images 44 and 47. There is also likely an 8 mm stone just above the ampulla on 71/2. Pancreas: Moderate pancreatic atrophy with multiple cystic foci including at up to 9 mm on 59/2. No acute inflammation. Spleen: Normal in size, without focal abnormality. Adrenals/Urinary Tract: Normal adrenal glands. Mild bilateral renal scarring. Lower pole left renal cyst. Bilateral too small to characterize renal lesions. No hydronephrosis. Normal urinary bladder. Stomach/Bowel: Normal distal stomach. Scattered colonic diverticula. Normal terminal ileum. Normal small bowel. Vascular/Lymphatic: Advanced aortic and branch vessel atherosclerosis. No abdominopelvic adenopathy. Reproductive: Normal uterus and adnexa.  Tubal ligation. Other: No significant free fluid.  Mild pelvic floor laxity. Musculoskeletal: No acute osseous abnormality. IMPRESSION: 1. Extensive choledocholithiasis, with borderline to mild duct dilatation after cholecystectomy. Recommend ERCP. 2. Motion and artifact degradation, as detailed above. 3. Bilateral pulmonary nodules, primarily felt to represent calcified granulomas. Given patient age, these are of questionable clinical significance. 4. Coronary artery atherosclerosis. Aortic Atherosclerosis (ICD10-I70.0). 5. Large hiatal hernia. 6. Small right pleural effusion. 7. Pancreatic atrophy with multiple cystic foci within, most likely related to side branch ductectasia or pseudocyst. Electronically Signed   By: Jeronimo GreavesKyle  Talbot M.D.   On: 10/16/2019 20:01   Ct Abdomen Pelvis W  Contrast  Result Date: 10/16/2019 CLINICAL DATA:  Four days weakness. Loss of appetite. Possible jaundice. History urinary tract infections. EXAM: CT CHEST, ABDOMEN, AND PELVIS WITH CONTRAST TECHNIQUE: Multidetector CT imaging of the chest, abdomen and pelvis was  performed following the standard protocol during bolus administration of intravenous contrast. CONTRAST:  OMNIPAQUE IOHEXOL 300 MG/ML  SOLN COMPARISON:  Chest radiograph 10/16/2019.  Pelvic CT 06/03/2018. FINDINGS: CT CHEST FINDINGS Cardiovascular: Advanced aortic and branch vessel atherosclerosis. Tortuous thoracic aorta. Moderate cardiomegaly with trace pericardial fluid, likely physiologic. Multivessel coronary artery atherosclerosis. No central pulmonary embolism, on this non-dedicated study. Mediastinum/Nodes: No mediastinal or hilar adenopathy. Large hiatal hernia, with greater than 2/3 of the stomach positioned within the chest. Lungs/Pleura: Small right pleural effusion. Trace left pleural thickening. Mild motion and is EKG lead artifact degradation. Moderate centrilobular emphysema. Bibasilar atelectasis. Probable calcified granuloma in the left lower lobe on 69/4. Scattered bilateral pulmonary nodules. Left upper lobe pulmonary nodule of 4 mm on 51/3. A likely calcified right upper lobe pulmonary nodule of 4 mm on 55/4. 4 mm nodule along the right minor fissure is positioned within the right middle lobe and may also be calcified. Musculoskeletal: No acute osseous abnormality. Remote right lower rib fractures. Advanced thoracolumbar spondylosis CT ABDOMEN PELVIS FINDINGS Hepatobiliary: Mild technique limitations secondary to motion and EKG leads continues into the upper abdomen. No focal liver lesion. Cholecystectomy. Mild intrahepatic duct dilatation. The common duct is borderline dilated for prior cholecystectomy state, 11 mm. There are noncalcified common duct stones including at 11 mm on 66/2 and 8 mm on 67/2. Also coronal images 44  and 47. There is also likely an 8 mm stone just above the ampulla on 71/2. Pancreas: Moderate pancreatic atrophy with multiple cystic foci including at up to 9 mm on 59/2. No acute inflammation. Spleen: Normal in size, without focal abnormality. Adrenals/Urinary Tract: Normal adrenal glands. Mild bilateral renal scarring. Lower pole left renal cyst. Bilateral too small to characterize renal lesions. No hydronephrosis. Normal urinary bladder. Stomach/Bowel: Normal distal stomach. Scattered colonic diverticula. Normal terminal ileum. Normal small bowel. Vascular/Lymphatic: Advanced aortic and branch vessel atherosclerosis. No abdominopelvic adenopathy. Reproductive: Normal uterus and adnexa.  Tubal ligation. Other: No significant free fluid.  Mild pelvic floor laxity. Musculoskeletal: No acute osseous abnormality. IMPRESSION: 1. Extensive choledocholithiasis, with borderline to mild duct dilatation after cholecystectomy. Recommend ERCP. 2. Motion and artifact degradation, as detailed above. 3. Bilateral pulmonary nodules, primarily felt to represent calcified granulomas. Given patient age, these are of questionable clinical significance. 4. Coronary artery atherosclerosis. Aortic Atherosclerosis (ICD10-I70.0). 5. Large hiatal hernia. 6. Small right pleural effusion. 7. Pancreatic atrophy with multiple cystic foci within, most likely related to side branch ductectasia or pseudocyst. Electronically Signed   By: Jeronimo Greaves M.D.   On: 10/16/2019 20:01   Dg Chest Port 1 View  Result Date: 10/16/2019 CLINICAL DATA:  Shortness of breath. EXAM: PORTABLE CHEST 1 VIEW COMPARISON:  06/06/2018 FINDINGS: There is chronic marked cardiomegaly. Pulmonary vascularity is normal. Aortic atherosclerosis. Double density at the right heart border may represent a pericardial cyst but appears unchanged. The possibility of a large hiatal hernia should also be considered. There is slight atelectasis at the lung bases which is new since  the prior study. No acute bone abnormality. IMPRESSION: 1. New slight atelectasis at the lung bases. 2. Chronic marked cardiomegaly. 3. Possible pericardial cyst at the right heart border. 4. Aortic atherosclerosis. Electronically Signed   By: Francene Boyers M.D.   On: 10/16/2019 18:22    EKG: Independently reviewed.  Atrial fibrillation with a rate of 76, left fascicular block.  No significant ST changes  Assessment/Plan Principal Problem:   Choledocholithiasis Active Problems:   Coronary artery disease due to lipid  rich plaque   Essential hypertension   Hyperlipidemia   Chronic anticoagulation   Depression, recurrent (HCC)   PAF (paroxysmal atrial fibrillation) (HCC)   Coronary artery disease   Hypothyroidism     #1 acute choledocholithiasis: Patient will be admitted.  She will be n.p.o.  IV meropenem.  GI consulted plan for ERCP most likely.  Pain management as well as nausea vomiting management.  #2 atrial fibrillation: Rate is controlled.  We will hold Xarelto for procedures.  #3 essential hypertension: Continue home regimen once p.o. intakes restarts.  In the meantime IV labetalol will be ordered.  #4 coronary artery disease: Stable.  No evidence of decompensation.  Continue with telemetry monitor  #5 hyperlipidemia: Resume statin when oral intake anxious.  #6 depression and anxiety: Continue home regimen whenever   DVT prophylaxis: Heparin Code Status: Full code Family Communication: Discussed care with patient no family available Disposition Plan: Home Consults called: Dr. Herbert Moors, gastroenterology Admission status: Inpatient  Severity of Illness: The appropriate patient status for this patient is INPATIENT. Inpatient status is judged to be reasonable and necessary in order to provide the required intensity of service to ensure the patient's safety. The patient's presenting symptoms, physical exam findings, and initial radiographic and laboratory data in the  context of their chronic comorbidities is felt to place them at high risk for further clinical deterioration. Furthermore, it is not anticipated that the patient will be medically stable for discharge from the hospital within 2 midnights of admission. The following factors support the patient status of inpatient.   " The patient's presenting symptoms include abdominal pain. " The worrisome physical exam findings include tenderness in the epigastric region. " The initial radiographic and laboratory data are worrisome because of evidence of choledocholithiasis. " The chronic co-morbidities include history of cholecystectomy.   * I certify that at the point of admission it is my clinical judgment that the patient will require inpatient hospital care spanning beyond 2 midnights from the point of admission due to high intensity of service, high risk for further deterioration and high frequency of surveillance required.Lonia Blood MD [facility] Pager [phone]  If 7PM-7AM, please contact night-coverage [email or web address] Password [facility]  10/16/2019, 9:53 PM

## 2019-10-16 NOTE — ED Triage Notes (Signed)
Pt BIB EMS from home. Pt reports x4 days of weakness, loss of appetite. Pts daughter reports foul urine and hx of UTIs. Pt appears to be slightly jaundice.   BP 188/110 HR 80 CBG 127 100% 3L O2  92% RA

## 2019-10-17 DIAGNOSIS — K8050 Calculus of bile duct without cholangitis or cholecystitis without obstruction: Secondary | ICD-10-CM

## 2019-10-17 DIAGNOSIS — Z7901 Long term (current) use of anticoagulants: Secondary | ICD-10-CM

## 2019-10-17 DIAGNOSIS — R7989 Other specified abnormal findings of blood chemistry: Secondary | ICD-10-CM

## 2019-10-17 LAB — URINALYSIS, ROUTINE W REFLEX MICROSCOPIC
Bacteria, UA: NONE SEEN
Glucose, UA: NEGATIVE mg/dL
Hgb urine dipstick: NEGATIVE
Ketones, ur: NEGATIVE mg/dL
Leukocytes,Ua: NEGATIVE
Nitrite: NEGATIVE
Protein, ur: 100 mg/dL — AB
Specific Gravity, Urine: 1.01 (ref 1.005–1.030)
pH: 5 (ref 5.0–8.0)

## 2019-10-17 LAB — CBC
HCT: 35.4 % — ABNORMAL LOW (ref 36.0–46.0)
Hemoglobin: 12.1 g/dL (ref 12.0–15.0)
MCH: 34.1 pg — ABNORMAL HIGH (ref 26.0–34.0)
MCHC: 34.2 g/dL (ref 30.0–36.0)
MCV: 99.7 fL (ref 80.0–100.0)
Platelets: 182 10*3/uL (ref 150–400)
RBC: 3.55 MIL/uL — ABNORMAL LOW (ref 3.87–5.11)
RDW: 15.1 % (ref 11.5–15.5)
WBC: 16.7 10*3/uL — ABNORMAL HIGH (ref 4.0–10.5)
nRBC: 0 % (ref 0.0–0.2)

## 2019-10-17 LAB — COMPREHENSIVE METABOLIC PANEL
ALT: 56 U/L — ABNORMAL HIGH (ref 0–44)
AST: 74 U/L — ABNORMAL HIGH (ref 15–41)
Albumin: 3 g/dL — ABNORMAL LOW (ref 3.5–5.0)
Alkaline Phosphatase: 348 U/L — ABNORMAL HIGH (ref 38–126)
Anion gap: 14 (ref 5–15)
BUN: 34 mg/dL — ABNORMAL HIGH (ref 8–23)
CO2: 26 mmol/L (ref 22–32)
Calcium: 8.5 mg/dL — ABNORMAL LOW (ref 8.9–10.3)
Chloride: 97 mmol/L — ABNORMAL LOW (ref 98–111)
Creatinine, Ser: 0.9 mg/dL (ref 0.44–1.00)
GFR calc Af Amer: >60 mL/min (ref >60)
GFR calc non Af Amer: 57 mL/min — ABNORMAL LOW (ref >60)
Glucose, Bld: 88 mg/dL (ref 70–99)
Potassium: 2.6 mmol/L — CL (ref 3.5–5.1)
Sodium: 137 mmol/L (ref 135–145)
Total Bilirubin: 4.7 mg/dL — ABNORMAL HIGH (ref 0.3–1.2)
Total Protein: 6.4 g/dL — ABNORMAL LOW (ref 6.5–8.1)

## 2019-10-17 LAB — MAGNESIUM: Magnesium: 1.5 mg/dL — ABNORMAL LOW (ref 1.7–2.4)

## 2019-10-17 LAB — POTASSIUM: Potassium: 3.3 mmol/L — ABNORMAL LOW (ref 3.5–5.1)

## 2019-10-17 LAB — SARS CORONAVIRUS 2 (TAT 6-24 HRS): SARS Coronavirus 2: NEGATIVE

## 2019-10-17 MED ORDER — LABETALOL HCL 5 MG/ML IV SOLN
10.0000 mg | INTRAVENOUS | Status: DC | PRN
  Administered 2019-10-17 (×2): 10 mg via INTRAVENOUS
  Filled 2019-10-17 (×5): qty 4

## 2019-10-17 MED ORDER — POTASSIUM CHLORIDE 10 MEQ/100ML IV SOLN
10.0000 meq | INTRAVENOUS | Status: AC
  Administered 2019-10-17 (×6): 10 meq via INTRAVENOUS
  Filled 2019-10-17 (×6): qty 100

## 2019-10-17 MED ORDER — LABETALOL HCL 5 MG/ML IV SOLN
10.0000 mg | INTRAVENOUS | Status: DC | PRN
  Administered 2019-10-17 – 2019-10-18 (×5): 10 mg via INTRAVENOUS
  Filled 2019-10-17 (×6): qty 4

## 2019-10-17 MED ORDER — MAGNESIUM SULFATE 2 GM/50ML IV SOLN
2.0000 g | Freq: Once | INTRAVENOUS | Status: AC
  Administered 2019-10-17: 2 g via INTRAVENOUS
  Filled 2019-10-17: qty 50

## 2019-10-17 MED ORDER — INFLUENZA VAC A&B SA ADJ QUAD 0.5 ML IM PRSY
0.5000 mL | PREFILLED_SYRINGE | INTRAMUSCULAR | Status: DC
  Filled 2019-10-17: qty 0.5

## 2019-10-17 MED ORDER — DEXTROSE-NACL 5-0.9 % IV SOLN
INTRAVENOUS | Status: DC
  Administered 2019-10-17 – 2019-10-18 (×2): via INTRAVENOUS

## 2019-10-17 MED ORDER — SODIUM CHLORIDE 0.9 % IV SOLN
1.0000 g | Freq: Two times a day (BID) | INTRAVENOUS | Status: DC
  Administered 2019-10-17: 1 g via INTRAVENOUS
  Filled 2019-10-17 (×2): qty 1

## 2019-10-17 MED ORDER — ONDANSETRON HCL 4 MG/2ML IJ SOLN: 4.0000 mg | Freq: Four times a day (QID) | INTRAMUSCULAR | Status: DC | PRN

## 2019-10-17 MED ORDER — PIPERACILLIN-TAZOBACTAM 3.375 G IVPB
3.3750 g | Freq: Three times a day (TID) | INTRAVENOUS | Status: DC
  Administered 2019-10-17 – 2019-10-19 (×4): 3.375 g via INTRAVENOUS
  Filled 2019-10-17 (×7): qty 50

## 2019-10-17 MED ORDER — ENOXAPARIN SODIUM 40 MG/0.4ML ~~LOC~~ SOLN
40.0000 mg | SUBCUTANEOUS | Status: DC
  Administered 2019-10-18: 40 mg via SUBCUTANEOUS
  Filled 2019-10-17: qty 0.4

## 2019-10-17 MED ORDER — ONDANSETRON HCL 4 MG PO TABS: 4.0000 mg | ORAL_TABLET | Freq: Four times a day (QID) | ORAL | Status: DC | PRN

## 2019-10-17 MED ORDER — MORPHINE SULFATE (PF) 2 MG/ML IV SOLN: 2.0000 mg | INTRAVENOUS | Status: DC | PRN

## 2019-10-17 NOTE — Consult Note (Addendum)
Consultation  Referring Provider:     Dr. Renford Dills Primary Care Physician:  Julaine Fusi, NP Primary Gastroenterologist: Dr. Russella Dar         Reason for Consultation:   Choledocholithiasis         HPI:   Christina Ortiz is a 85 y.o. female with a past medical history as listed below including CKD, CAD and A. fib on Xarelto (08/22/2019 echo with LVEF 55-60%, no stenosis of the aortic valve), prior cholecystectomy with possible ERCP ("yrs ago") and GERD, who presented to the ER on 10/16/2019 for a complaint of abdominal pain and weakness.    Today, the patient describes that she had epigastric pain associated with some excess gas which seemed to start "out of the blue" about 4 days ago. This seemed to wax and wane in intensity but at it's worst was a 9/10. Was chewing Tums which seemed to help a little but. Over the past 4 days grew increasingly weak and because of this presented to the ER.    Currently, the patient tells me she is not in any pain but does complain of a dry mouth because " I haven't been able to eat anything".    Last dose of Xarelto Monday pm.   Denies fever, chills, weight loss, change in bowel habits, nausea or vomiting.  ER course: WBC 18.8, hemoglobin 12.6 and platelet count of 200, potassium 4.8, creatinine 1.04, LFTs with an AST of 139, ALT of 76, total bilirubin of 6.4, CT abdomen pelvis showed extensive choledocholithiasis with borderline to mild duct dilatation, bilateral pulmonary nodules and coronary artery atherosclerosis, small right pleural effusions  GI history: 11/01/2018 EGD, Dr. Russella Dar: - Benign-appearing esophageal stenosis. Dilated. - Medium-sized hiatal hernia. - Erosive gastropathy. Biopsied. - Periampullary diverticulum. - Otherwise normal duodenal bulb and second portion of the duodenum.  Past Medical History:  Diagnosis Date   Acute bronchitis 10/2017   Arthritis    CKD (chronic kidney disease)    Xarelto dose is 15 mg QD   Coronary  artery disease    a. s/p PCI/stenting to RCA and LAD ([facility] in [place]) in 2006; b. Nuc 1/15: No ischemia, EF 70%   Depression    Gallstones    Gastritis    Hiatal hernia    Hip fracture (HCC)    History of cholecystectomy    History of echocardiogram    a. Echo 1/15: Moderate LVH, EF 55-60%, impaired relaxation, mild AS   Hyperlipidemia    Hypertension    Hypokalemia    Mobitz type 1 second degree atrioventricular block    a. Event Monitor 3/15: NSR, first-degree AV block, second-degree AB block - Mobitz 1, PACs, NSVT (5 beats)   PAF (paroxysmal atrial fibrillation) (HCC)    Thyroid disease     Past Surgical History:  Procedure Laterality Date   ANGIOPLASTY     with sent placement   BLEPHAROPLASTY     BREAST LUMPECTOMY Left    CARDIAC CATHETERIZATION     CATARACT EXTRACTION Bilateral    CHOLECYSTECTOMY     DILATION AND CURETTAGE OF UTERUS      Family History  Problem Relation Age of Onset   Heart disease Mother    Heart attack Mother    Lung cancer Father    Lymphoma Brother    Heart failure Brother    Esophageal cancer Paternal Uncle    Colon cancer Neg Hx    Colon polyps Neg Hx  Rectal cancer Neg Hx    Stomach cancer Neg Hx     Social History   Tobacco Use   Smoking status: Former Smoker    Packs/day: 0.50    Years: 67.00    Pack years: 33.50    Types: Cigarettes    Quit date: 10/24/2014    Years since quitting: 4.9   Smokeless tobacco: Never Used   Tobacco comment: quit 2015  Substance Use Topics   Alcohol use: Yes    Alcohol/week: 10.0 standard drinks    Types: 10 Shots of liquor per week    Comment: 7 a week   Drug use: No    Prior to Admission medications   Medication Sig Start Date End Date Taking? Authorizing Provider  acetaminophen (TYLENOL) 325 MG tablet Take 650 mg by mouth every 6 (six) hours as needed for fever.    Yes [provider]  XARELTO 15 MG TABS tablet TAKE 1 TABLET (15 MG  TOTAL) BY MOUTH DAILY WITH SUPPER. Patient taking differently: Take 15 mg by mouth daily with supper.  01/19/19  Yes Jake BatheSkains, Mark C, MD  amLODipine (NORVASC) 5 MG tablet Take 1 tablet (5 mg total) by mouth daily. 08/03/19 08/02/20  Jake BatheSkains, Mark C, MD  antiseptic oral rinse (BIOTENE) LIQD 15 mLs by Mouth Rinse route as needed for dry mouth.    [provider]  calcitonin, salmon, (MIACALCIN/FORTICAL) 200 UNIT/ACT nasal spray  09/17/19   [provider]  celecoxib (CELEBREX) 200 MG capsule  08/29/19   [provider]  cholecalciferol (VITAMIN D) 1000 units tablet Take 1,000 Units by mouth every evening.    [provider]  ciprofloxacin (CIPRO) 250 MG tablet Take 1 tablet (250 mg total) by mouth 2 (two) times daily. 09/27/19   Danford, Orpha BurKaty D, NP  folic acid (FOLVITE) 1 MG tablet TAKE 1 TABLET BY MOUTH DAILY 09/04/19   Danford, Orpha BurKaty D, NP  furosemide (LASIX) 20 MG tablet Take 1 tablet (20 mg total) by mouth daily. 08/10/19   Jake BatheSkains, Mark C, MD  ipratropium-albuterol (DUONEB) 0.5-2.5 (3) MG/3ML SOLN Take 3 mLs by nebulization as needed.     [provider]  levothyroxine (SYNTHROID) 75 MCG tablet TAKE 1 TABLET BY MOUTH DAILY BEFORE BREAKFAST. 10/10/19   Danford, Orpha BurKaty D, NP  magnesium oxide (MAG-OX) 400 MG tablet Take 400 mg by mouth daily.    [provider]  metoprolol tartrate (LOPRESSOR) 25 MG tablet Take 0.5 tablets (12.5 mg total) by mouth 2 (two) times daily. 07/18/19   Danford, Orpha BurKaty D, NP  multivitamin (PROSIGHT) TABS tablet Take 1 tablet by mouth daily. 06/08/18   Berton Mountgbata, Sylvester I, MD  omeprazole (PRILOSEC) 20 MG capsule TAKE 1 CAPSULE BY MOUTH DAILY AS NEEDED FOR HEARTBURN. 09/04/19   Danford, Orpha BurKaty D, NP  oxybutynin (DITROPAN-XL) 10 MG 24 hr tablet  08/29/19   [provider]  PARoxetine (PAXIL) 40 MG tablet Take 1 tablet (40 mg total) by mouth daily. 07/26/19   Danford, Orpha BurKaty D, NP  polyethylene glycol (MIRALAX / GLYCOLAX) packet Take 17 g by  mouth daily.    [provider]  potassium chloride SA (K-DUR) 20 MEQ tablet Take 1 tablet (20 mEq total) by mouth daily. 08/10/19   Jake BatheSkains, Mark C, MD  Propylene Glycol (SYSTANE BALANCE) 0.6 % SOLN Place 1 drop into both eyes daily.    [provider]  quinapril (ACCUPRIL) 40 MG tablet TAKE 1 TABLET (40 MG TOTAL) BY MOUTH DAILY. 05/14/19  Danford, [name], NP  simvastatin (ZOCOR) 5 MG tablet TAKE 1 TABLET BY MOUTH EVERY EVENING. 10/10/19   Danford, [name], NP  tolterodine (DETROL LA) 4 MG 24 hr capsule Take 1 capsule by mouth daily. 07/11/19   [provider]  traMADol Janean Sark) 50 MG tablet  08/29/19   [provider]    Current Facility-Administered Medications  Medication Dose Route Frequency Provider Last Rate Last Dose   albuterol (VENTOLIN HFA) 108 (90 Base) MCG/ACT inhaler 2 puff  2 puff Inhalation Q4H PRN Loren Racer, MD   2 puff at 10/16/19 1753   dextrose 5 %-0.9 % sodium chloride infusion   Intravenous Continuous Rometta Emery, MD 100 mL/hr at 10/17/19 0414     influenza vaccine adjuvanted (FLUAD) injection 0.5 mL  0.5 mL Intramuscular Tomorrow-1000 Earlie Lou L, MD       labetalol (NORMODYNE) injection 10 mg  10 mg Intravenous Q2H PRN Rometta Emery, MD   10 mg at 10/17/19 1224   magnesium sulfate IVPB 2 g 50 mL  2 g Intravenous Once Burnadette Pop, MD       meropenem (MERREM) 1 g in sodium chloride 0.9 % 100 mL IVPB  1 g Intravenous Q12H Earlie Lou L, MD 200 mL/hr at 10/17/19 0500 1 g at 10/17/19 0500   morphine 2 MG/ML injection 2 mg  2 mg Intravenous Q3H PRN Rometta Emery, MD       ondansetron (ZOFRAN) tablet 4 mg  4 mg Oral Q6H PRN Rometta Emery, MD       Or   ondansetron (ZOFRAN) injection 4 mg  4 mg Intravenous Q6H PRN Rometta Emery, MD       potassium chloride 10 mEq in 100 mL IVPB  10 mEq Intravenous Q1 Hr x 6 Burnadette Pop, MD        Allergies as of 10/16/2019 - Review Complete 10/16/2019    Allergen Reaction Noted   Tape Other (See Comments) 01/23/2017     Review of Systems:    Constitutional: No weight loss, fever or chills Skin: No rash  Cardiovascular: No chest pain   Respiratory: No SOB  Gastrointestinal: See HPI and otherwise negative Genitourinary: No dysuria  Neurological: No headache, dizziness or syncope Musculoskeletal: No new muscle or joint pain Hematologic: No bleeding Psychiatric: No history of depression or anxiety    Physical Exam:  Vital signs in last 24 hours: Temp:  [97.4 F (36.3 C)-98.7 F (37.1 C)] 97.5 F (36.4 C) (10/21 0617) Pulse Rate:  [71-95] 71 (10/21 0642) Resp:  [16-33] 20 (10/21 0617) BP: (142-194)/(89-129) 148/89 (10/21 0642) SpO2:  [93 %-100 %] 98 % (10/21 0642) Weight:  [65 kg-66 kg] 66 kg (10/21 0321) Last BM Date: 10/16/19 General:   Pleasant elderly Caucasian female appears to be in NAD, Well developed, Well nourished, alert and cooperative Head:  Normocephalic and atraumatic. Eyes:   PEERL, EOMI. No icterus. Conjunctiva pink. Ears:  Normal auditory acuity. Neck:  Supple Throat: Oral cavity and pharynx without inflammation, swelling or lesion.  Lungs: Respirations even and unlabored. Lungs clear to auscultation bilaterally.   No wheezes, crackles, or rhonchi.  Heart: Normal S1, S2. No MRG. Regular rate and rhythm. No peripheral edema, cyanosis or pallor.  Abdomen:  Soft, nondistended, mild epigastric and right upper quadrant TTP no rebound or guarding. Normal bowel sounds. No appreciable masses or hepatomegaly. Rectal:  Not performed.  Msk:  Symmetrical without gross deformities. Peripheral pulses intact.  Extremities:  Without edema, no deformity or joint abnormality.  °Neurologic:  Alert and  oriented x4;  grossly normal neurologically.  °Skin:   Dry and intact without significant lesions or rashes. °Psychiatric: Demonstrates good judgement and reason without abnormal affect or behaviors. ° ° °LAB RESULTS: °Recent Labs   °  10/16/19 °1819 10/17/19 °0558  °WBC 18.8* 16.7*  °HGB 12.6 12.1  °HCT 37.5 35.4*  °PLT 200 182  ° °BMET °Recent Labs  °  10/16/19 °1819 10/17/19 °0558  °NA 136 137  °K 4.8 2.6*  °CL 95* 97*  °CO2 26 26  °GLUCOSE 104* 88  °BUN 36* 34*  °CREATININE 1.04* 0.90  °CALCIUM 8.6* 8.5*  ° °LFT °Recent Labs  °  10/17/19 °0558  °PROT 6.4*  °ALBUMIN 3.0*  °AST 74*  °ALT 56*  °ALKPHOS 348*  °BILITOT 4.7*  ° ° °STUDIES: °Ct Chest W Contrast ° °Result Date: 10/16/2019 °CLINICAL DATA:  Four days weakness. Loss of appetite. Possible jaundice. History urinary tract infections. EXAM: CT CHEST, ABDOMEN, AND PELVIS WITH CONTRAST TECHNIQUE: Multidetector CT imaging of the chest, abdomen and pelvis was performed following the standard protocol during bolus administration of intravenous contrast. CONTRAST:  100mL OMNIPAQUE IOHEXOL 300 MG/ML  SOLN COMPARISON:  Chest radiograph 10/16/2019.  Pelvic CT 06/03/2018. FINDINGS: CT CHEST FINDINGS Cardiovascular: Advanced aortic and branch vessel atherosclerosis. Tortuous thoracic aorta. Moderate cardiomegaly with trace pericardial fluid, likely physiologic. Multivessel coronary artery atherosclerosis. No central pulmonary embolism, on this non-dedicated study. Mediastinum/Nodes: No mediastinal or hilar adenopathy. Large hiatal hernia, with greater than 2/3 of the stomach positioned within the chest. Lungs/Pleura: Small right pleural effusion. Trace left pleural thickening. Mild motion and is EKG lead artifact degradation. Moderate centrilobular emphysema. Bibasilar atelectasis. Probable calcified granuloma in the left lower lobe on 69/4. Scattered bilateral pulmonary nodules. Left upper lobe pulmonary nodule of 4 mm on 51/3. A likely calcified right upper lobe pulmonary nodule of 4 mm on 55/4. 4 mm nodule along the right minor fissure is positioned within the right middle lobe and may also be calcified. Musculoskeletal: No acute osseous abnormality. Remote right lower rib fractures. Advanced  thoracolumbar spondylosis CT ABDOMEN PELVIS FINDINGS Hepatobiliary: Mild technique limitations secondary to motion and EKG leads continues into the upper abdomen. No focal liver lesion. Cholecystectomy. Mild intrahepatic duct dilatation. The common duct is borderline dilated for prior cholecystectomy state, 11 mm. There are noncalcified common duct stones including at 11 mm on 66/2 and 8 mm on 67/2. Also coronal images 44 and 47. There is also likely an 8 mm stone just above the ampulla on 71/2. Pancreas: Moderate pancreatic atrophy with multiple cystic foci including at up to 9 mm on 59/2. No acute inflammation. Spleen: Normal in size, without focal abnormality. Adrenals/Urinary Tract: Normal adrenal glands. Mild bilateral renal scarring. Lower pole left renal cyst. Bilateral too small to characterize renal lesions. No hydronephrosis. Normal urinary bladder. Stomach/Bowel: Normal distal stomach. Scattered colonic diverticula. Normal terminal ileum. Normal small bowel. Vascular/Lymphatic: Advanced aortic and branch vessel atherosclerosis. No abdominopelvic adenopathy. Reproductive: Normal uterus and adnexa.  Tubal ligation. Other: No significant free fluid.  Mild pelvic floor laxity. Musculoskeletal: No acute osseous abnormality. IMPRESSION: 1. Extensive choledocholithiasis, with borderline to mild duct dilatation after cholecystectomy. Recommend ERCP. 2. Motion and artifact degradation, as detailed above. 3. Bilateral pulmonary nodules, primarily felt to represent calcified granulomas. Given patient age, these are of questionable clinical significance. 4. Coronary artery atherosclerosis. Aortic Atherosclerosis (ICD10-I70.0). 5. Large hiatal hernia. 6. Small right pleural effusion. 7. Pancreatic   atrophy with multiple cystic foci within, most likely related to side branch ductectasia or pseudocyst. Electronically Signed   By: Abigail Miyamoto M.D.   On: 10/16/2019 20:01   Ct Abdomen Pelvis W Contrast  Result Date:  10/16/2019 CLINICAL DATA:  Four days weakness. Loss of appetite. Possible jaundice. History urinary tract infections. EXAM: CT CHEST, ABDOMEN, AND PELVIS WITH CONTRAST TECHNIQUE: Multidetector CT imaging of the chest, abdomen and pelvis was performed following the standard protocol during bolus administration of intravenous contrast. CONTRAST:  154mL OMNIPAQUE IOHEXOL 300 MG/ML  SOLN COMPARISON:  Chest radiograph 10/16/2019.  Pelvic CT 06/03/2018. FINDINGS: CT CHEST FINDINGS Cardiovascular: Advanced aortic and branch vessel atherosclerosis. Tortuous thoracic aorta. Moderate cardiomegaly with trace pericardial fluid, likely physiologic. Multivessel coronary artery atherosclerosis. No central pulmonary embolism, on this non-dedicated study. Mediastinum/Nodes: No mediastinal or hilar adenopathy. Large hiatal hernia, with greater than 2/3 of the stomach positioned within the chest. Lungs/Pleura: Small right pleural effusion. Trace left pleural thickening. Mild motion and is EKG lead artifact degradation. Moderate centrilobular emphysema. Bibasilar atelectasis. Probable calcified granuloma in the left lower lobe on 69/4. Scattered bilateral pulmonary nodules. Left upper lobe pulmonary nodule of 4 mm on 51/3. A likely calcified right upper lobe pulmonary nodule of 4 mm on 55/4. 4 mm nodule along the right minor fissure is positioned within the right middle lobe and may also be calcified. Musculoskeletal: No acute osseous abnormality. Remote right lower rib fractures. Advanced thoracolumbar spondylosis CT ABDOMEN PELVIS FINDINGS Hepatobiliary: Mild technique limitations secondary to motion and EKG leads continues into the upper abdomen. No focal liver lesion. Cholecystectomy. Mild intrahepatic duct dilatation. The common duct is borderline dilated for prior cholecystectomy state, 11 mm. There are noncalcified common duct stones including at 11 mm on 66/2 and 8 mm on 67/2. Also coronal images 44 and 47. There is also  likely an 8 mm stone just above the ampulla on 71/2. Pancreas: Moderate pancreatic atrophy with multiple cystic foci including at up to 9 mm on 59/2. No acute inflammation. Spleen: Normal in size, without focal abnormality. Adrenals/Urinary Tract: Normal adrenal glands. Mild bilateral renal scarring. Lower pole left renal cyst. Bilateral too small to characterize renal lesions. No hydronephrosis. Normal urinary bladder. Stomach/Bowel: Normal distal stomach. Scattered colonic diverticula. Normal terminal ileum. Normal small bowel. Vascular/Lymphatic: Advanced aortic and branch vessel atherosclerosis. No abdominopelvic adenopathy. Reproductive: Normal uterus and adnexa.  Tubal ligation. Other: No significant free fluid.  Mild pelvic floor laxity. Musculoskeletal: No acute osseous abnormality. IMPRESSION: 1. Extensive choledocholithiasis, with borderline to mild duct dilatation after cholecystectomy. Recommend ERCP. 2. Motion and artifact degradation, as detailed above. 3. Bilateral pulmonary nodules, primarily felt to represent calcified granulomas. Given patient age, these are of questionable clinical significance. 4. Coronary artery atherosclerosis. Aortic Atherosclerosis (ICD10-I70.0). 5. Large hiatal hernia. 6. Small right pleural effusion. 7. Pancreatic atrophy with multiple cystic foci within, most likely related to side branch ductectasia or pseudocyst. Electronically Signed   By: Abigail Miyamoto M.D.   On: 10/16/2019 20:01   Dg Chest Port 1 View  Result Date: 10/16/2019 CLINICAL DATA:  Shortness of breath. EXAM: PORTABLE CHEST 1 VIEW COMPARISON:  06/06/2018 FINDINGS: There is chronic marked cardiomegaly. Pulmonary vascularity is normal. Aortic atherosclerosis. Double density at the right heart border may represent a pericardial cyst but appears unchanged. The possibility of a large hiatal hernia should also be considered. There is slight atelectasis at the lung bases which is new since the prior study. No  acute bone abnormality. IMPRESSION: 1. New  slight atelectasis at the lung bases. 2. Chronic marked cardiomegaly. 3. Possible pericardial cyst at the right heart border. 4. Aortic atherosclerosis. Electronically Signed   By: Francene BoyersJames  Maxwell M.D.   On: 10/16/2019 18:22     Impression / Plan:   Impression: 1.  Choledocholithiasis: As seen on CT with elevated LFTs 2.  A. fib: On Xarelto, this has been held since 10/16/2019 p.m. 3. Hypokalemia: K 2.6 this morning  Plan: 1.  Continue antiemetics and pain control 2.  We will plan for ERCP tomorrow with Dr. Christella HartiganJacobs.  Did discuss risks, benefits, limitations and alternatives and the patient agrees to proceed. 3.  Patient can be on clear liquid diet today and n.p.o. after midnight 4.  Continue to hold Xarelto 5. Agree with K replacement- will need to be in normal range by time of procedure tomorrow. Placed recheck of CMP in the am. 6. Continue abx 7.  Please await any further recommendations from Dr. Rhea BeltonPyrtle later today  Thank you for your kind consultation, we will continue to follow.  Violet BaldyJennifer Lynne [facility]  10/17/2019, 8:54 AM

## 2019-10-17 NOTE — Progress Notes (Addendum)
PROGRESS NOTE    Christina Ortiz  ERD:[id number] DOB: 04-01-1929 DOA: 10/16/2019 PCP: Esaw Grandchild, NP   Brief Narrative:  Patient is 85 year old female with history of CKD stage III, osteoarthritis, paroxysmal A. fib on Xarelto, coronary disease, status post cholecystectomy, GERD, hypertension, hyperlipidemia who presented to the emergency department with complaints of generalized weakness, abdominal pain, nausea but no vomiting.  Work-up in the emergency department showed increased LFTs including bilirubin more than 6.  Imaging showed choledocholithiasis.  GI consulted.  Plan for ERCP tomorrow.  Assessment & Plan:   Principal Problem:   Choledocholithiasis Active Problems:   Coronary artery disease due to lipid rich plaque   Essential hypertension   Hyperlipidemia   Chronic anticoagulation   Depression, recurrent (HCC)   PAF (paroxysmal atrial fibrillation) (HCC)   Coronary artery disease   Hypothyroidism   Elevated LFTs   Choledocholithiasis: Patient is a status post cholecystectomy.  Continue n.p.o. status.  Also started on IV antibiotic to cover for possible cholangitis.  Has leukocytosis.  Afebrile.  Elevated liver enzymes.  We will continue to monitor liver enzymes.  GI planning for ERCP tomorrow.  Currently she denies any abdomen pain, nausea or vomiting. CT imaging showed extensive choledocholithiasis, with borderline to mild duct dilatation after cholecystectomy.  Hypokalemia/hypomagnesemia: Being supplemented.  We will continue to monitor.  A. fib: Currently rate is controlled.  Was on Xarelto for anticoagulation which is on hold.  Hypertension: Hypertensive.  Continue as needed meds as needed  Coronary artery disease: Stable.  Hyperlipidemia: On statin at home.  Depression/anxiety: Currently stable.         DVT prophylaxis:Lovenox Code Status: Full Family Communication: Called daughter on phone  Disposition Plan: Home after full  work-up   Consultants: GI  Procedures: None  Antimicrobials:  Anti-infectives (From admission, onward)   Start     Dose/Rate Route Frequency Ordered Stop   10/17/19 0600  meropenem (MERREM) 1 g in sodium chloride 0.9 % 100 mL IVPB     1 g 200 mL/hr over 30 Minutes Intravenous Every 12 hours 10/17/19 0322     10/16/19 2115  piperacillin-tazobactam (ZOSYN) IVPB 3.375 g     3.375 g 100 mL/hr over 30 Minutes Intravenous  Once 10/16/19 2102 10/17/19 0246      Subjective:  Patient seen and examined the bedside this morning.  Hemodynamically stable.  Looks comfortable during my evaluation.  Denies any abdomen pain, nausea or vomiting. Objective: Vitals:   10/17/19 0617 10/17/19 0642 10/17/19 1005 10/17/19 1109  BP: (!) 184/119 (!) 148/89 (!) 185/120 (!) 154/99  Pulse: 84 71 80 83  Resp: 20  16   Temp: (!) 97.5 F (36.4 C)     TempSrc:      SpO2: 99% 98% 100%   Weight:      Height:        Intake/Output Summary (Last 24 hours) at 10/17/2019 1230 Last data filed at 10/17/2019 0600 Gross per 24 hour  Intake 760 ml  Output --  Net 760 ml   Filed Weights   10/16/19 1652 10/17/19 0321  Weight: 65 kg 66 kg    Examination:  General exam: Pleasant elderly lady, HEENT:PERRL,Oral mucosa moist, Ear/Nose normal on gross exam Respiratory system: Bilateral equal air entry, normal vesicular breath sounds, no wheezes or crackles  Cardiovascular system: S1 & S2 heard, RRR. No JVD, murmurs, rubs, gallops or clicks. No pedal edema. Gastrointestinal system: Abdomen is nondistended, soft and has mild tenderness in the right upper quadrant.  No organomegaly or masses felt. Normal bowel sounds heard. Central nervous system: Alert and oriented. No focal neurological deficits. Extremities: No edema, no clubbing ,no cyanosis, distal peripheral pulses palpable. Skin: No rashes, lesions or ulcers,no icterus ,no pallor    Data Reviewed: I have personally reviewed following labs and imaging  studies  CBC: Recent Labs  Lab 10/16/19 1819 10/17/19 0558  WBC 18.8* 16.7*  NEUTROABS 16.8*  --   HGB 12.6 12.1  HCT 37.5 35.4*  MCV 101.1* 99.7  PLT 200 182   Basic Metabolic Panel: Recent Labs  Lab 10/16/19 1819 10/17/19 0558  NA 136 137  K 4.8 2.6*  CL 95* 97*  CO2 26 26  GLUCOSE 104* 88  BUN 36* 34*  CREATININE 1.04* 0.90  CALCIUM 8.6* 8.5*  MG  --  1.5*   GFR: Estimated Creatinine Clearance: 37.6 mL/min (by C-G formula based on SCr of 0.9 mg/dL). Liver Function Tests: Recent Labs  Lab 10/16/19 1819 10/17/19 0558  AST 139* 74*  ALT 76* 56*  ALKPHOS 377* 348*  BILITOT 6.4* 4.7*  PROT 6.6 6.4*  ALBUMIN 3.3* 3.0*   Recent Labs  Lab 10/16/19 1819  LIPASE 17   No results for input(s): AMMONIA in the last 168 hours. Coagulation Profile: No results for input(s): INR, PROTIME in the last 168 hours. Cardiac Enzymes: No results for input(s): CKTOTAL, CKMB, CKMBINDEX, TROPONINI in the last 168 hours. BNP (last 3 results) No results for input(s): PROBNP in the last 8760 hours. HbA1C: No results for input(s): HGBA1C in the last 72 hours. CBG: No results for input(s): GLUCAP in the last 168 hours. Lipid Profile: No results for input(s): CHOL, HDL, LDLCALC, TRIG, CHOLHDL, LDLDIRECT in the last 72 hours. Thyroid Function Tests: No results for input(s): TSH, T4TOTAL, FREET4, T3FREE, THYROIDAB in the last 72 hours. Anemia Panel: No results for input(s): VITAMINB12, FOLATE, FERRITIN, TIBC, IRON, RETICCTPCT in the last 72 hours. Sepsis Labs: No results for input(s): PROCALCITON, LATICACIDVEN in the last 168 hours.  Recent Results (from the past 240 hour(s))  SARS CORONAVIRUS 2 (TAT 6-24 HRS) Nasopharyngeal Nasopharyngeal Swab     Status: None   Collection Time: 10/16/19  6:12 PM   Specimen: Nasopharyngeal Swab  Result Value Ref Range Status   SARS Coronavirus 2 NEGATIVE NEGATIVE Final    Comment: (NOTE) SARS-CoV-2 target nucleic acids are NOT DETECTED. The  SARS-CoV-2 RNA is generally detectable in upper and lower respiratory specimens during the acute phase of infection. Negative results do not preclude SARS-CoV-2 infection, do not rule out co-infections with other pathogens, and should not be used as the sole basis for treatment or other patient management decisions. Negative results must be combined with clinical observations, patient history, and epidemiological information. The expected result is Negative. Fact Sheet for Patients: HairSlick.no Fact Sheet for Healthcare Providers: quierodirigir.com This test is not yet approved or cleared by the [place] FDA and  has been authorized for detection and/or diagnosis of SARS-CoV-2 by FDA under an Emergency Use Authorization (EUA). This EUA will remain  in effect (meaning this test can be used) for the duration of the COVID-19 declaration under Section 56 4(b)(1) of the Act, 21 U.S.C. section 360bbb-3(b)(1), unless the authorization is terminated or revoked sooner. Performed at [facility], [address], [place], [place] [address]          Radiology Studies: Ct Chest W Contrast  Result Date: 10/16/2019 CLINICAL DATA:  Four days weakness. Loss of appetite. Possible jaundice.  History urinary tract infections. EXAM: CT CHEST, ABDOMEN, AND PELVIS WITH CONTRAST TECHNIQUE: Multidetector CT imaging of the chest, abdomen and pelvis was performed following the standard protocol during bolus administration of intravenous contrast. CONTRAST:  100mL OMNIPAQUE IOHEXOL 300 MG/ML  SOLN COMPARISON:  Chest radiograph 10/16/2019.  Pelvic CT 06/03/2018. FINDINGS: CT CHEST FINDINGS Cardiovascular: Advanced aortic and branch vessel atherosclerosis. Tortuous thoracic aorta. Moderate cardiomegaly with trace pericardial fluid, likely physiologic. Multivessel coronary artery atherosclerosis. No central pulmonary embolism, on this non-dedicated  study. Mediastinum/Nodes: No mediastinal or hilar adenopathy. Large hiatal hernia, with greater than 2/3 of the stomach positioned within the chest. Lungs/Pleura: Small right pleural effusion. Trace left pleural thickening. Mild motion and is EKG lead artifact degradation. Moderate centrilobular emphysema. Bibasilar atelectasis. Probable calcified granuloma in the left lower lobe on 69/4. Scattered bilateral pulmonary nodules. Left upper lobe pulmonary nodule of 4 mm on 51/3. A likely calcified right upper lobe pulmonary nodule of 4 mm on 55/4. 4 mm nodule along the right minor fissure is positioned within the right middle lobe and may also be calcified. Musculoskeletal: No acute osseous abnormality. Remote right lower rib fractures. Advanced thoracolumbar spondylosis CT ABDOMEN PELVIS FINDINGS Hepatobiliary: Mild technique limitations secondary to motion and EKG leads continues into the upper abdomen. No focal liver lesion. Cholecystectomy. Mild intrahepatic duct dilatation. The common duct is borderline dilated for prior cholecystectomy state, 11 mm. There are noncalcified common duct stones including at 11 mm on 66/2 and 8 mm on 67/2. Also coronal images 44 and 47. There is also likely an 8 mm stone just above the ampulla on 71/2. Pancreas: Moderate pancreatic atrophy with multiple cystic foci including at up to 9 mm on 59/2. No acute inflammation. Spleen: Normal in size, without focal abnormality. Adrenals/Urinary Tract: Normal adrenal glands. Mild bilateral renal scarring. Lower pole left renal cyst. Bilateral too small to characterize renal lesions. No hydronephrosis. Normal urinary bladder. Stomach/Bowel: Normal distal stomach. Scattered colonic diverticula. Normal terminal ileum. Normal small bowel. Vascular/Lymphatic: Advanced aortic and branch vessel atherosclerosis. No abdominopelvic adenopathy. Reproductive: Normal uterus and adnexa.  Tubal ligation. Other: No significant free fluid.  Mild pelvic floor  laxity. Musculoskeletal: No acute osseous abnormality. IMPRESSION: 1. Extensive choledocholithiasis, with borderline to mild duct dilatation after cholecystectomy. Recommend ERCP. 2. Motion and artifact degradation, as detailed above. 3. Bilateral pulmonary nodules, primarily felt to represent calcified granulomas. Given patient age, these are of questionable clinical significance. 4. Coronary artery atherosclerosis. Aortic Atherosclerosis (ICD10-I70.0). 5. Large hiatal hernia. 6. Small right pleural effusion. 7. Pancreatic atrophy with multiple cystic foci within, most likely related to side branch ductectasia or pseudocyst. Electronically Signed   By: Jeronimo GreavesKyle  Talbot M.D.   On: 10/16/2019 20:01   Ct Abdomen Pelvis W Contrast  Result Date: 10/16/2019 CLINICAL DATA:  Four days weakness. Loss of appetite. Possible jaundice. History urinary tract infections. EXAM: CT CHEST, ABDOMEN, AND PELVIS WITH CONTRAST TECHNIQUE: Multidetector CT imaging of the chest, abdomen and pelvis was performed following the standard protocol during bolus administration of intravenous contrast. CONTRAST:  100mL OMNIPAQUE IOHEXOL 300 MG/ML  SOLN COMPARISON:  Chest radiograph 10/16/2019.  Pelvic CT 06/03/2018. FINDINGS: CT CHEST FINDINGS Cardiovascular: Advanced aortic and branch vessel atherosclerosis. Tortuous thoracic aorta. Moderate cardiomegaly with trace pericardial fluid, likely physiologic. Multivessel coronary artery atherosclerosis. No central pulmonary embolism, on this non-dedicated study. Mediastinum/Nodes: No mediastinal or hilar adenopathy. Large hiatal hernia, with greater than 2/3 of the stomach positioned within the chest. Lungs/Pleura: Small right pleural effusion. Trace left pleural thickening.  Mild motion and is EKG lead artifact degradation. Moderate centrilobular emphysema. Bibasilar atelectasis. Probable calcified granuloma in the left lower lobe on 69/4. Scattered bilateral pulmonary nodules. Left upper lobe  pulmonary nodule of 4 mm on 51/3. A likely calcified right upper lobe pulmonary nodule of 4 mm on 55/4. 4 mm nodule along the right minor fissure is positioned within the right middle lobe and may also be calcified. Musculoskeletal: No acute osseous abnormality. Remote right lower rib fractures. Advanced thoracolumbar spondylosis CT ABDOMEN PELVIS FINDINGS Hepatobiliary: Mild technique limitations secondary to motion and EKG leads continues into the upper abdomen. No focal liver lesion. Cholecystectomy. Mild intrahepatic duct dilatation. The common duct is borderline dilated for prior cholecystectomy state, 11 mm. There are noncalcified common duct stones including at 11 mm on 66/2 and 8 mm on 67/2. Also coronal images 44 and 47. There is also likely an 8 mm stone just above the ampulla on 71/2. Pancreas: Moderate pancreatic atrophy with multiple cystic foci including at up to 9 mm on 59/2. No acute inflammation. Spleen: Normal in size, without focal abnormality. Adrenals/Urinary Tract: Normal adrenal glands. Mild bilateral renal scarring. Lower pole left renal cyst. Bilateral too small to characterize renal lesions. No hydronephrosis. Normal urinary bladder. Stomach/Bowel: Normal distal stomach. Scattered colonic diverticula. Normal terminal ileum. Normal small bowel. Vascular/Lymphatic: Advanced aortic and branch vessel atherosclerosis. No abdominopelvic adenopathy. Reproductive: Normal uterus and adnexa.  Tubal ligation. Other: No significant free fluid.  Mild pelvic floor laxity. Musculoskeletal: No acute osseous abnormality. IMPRESSION: 1. Extensive choledocholithiasis, with borderline to mild duct dilatation after cholecystectomy. Recommend ERCP. 2. Motion and artifact degradation, as detailed above. 3. Bilateral pulmonary nodules, primarily felt to represent calcified granulomas. Given patient age, these are of questionable clinical significance. 4. Coronary artery atherosclerosis. Aortic Atherosclerosis  (ICD10-I70.0). 5. Large hiatal hernia. 6. Small right pleural effusion. 7. Pancreatic atrophy with multiple cystic foci within, most likely related to side branch ductectasia or pseudocyst. Electronically Signed   By: Jeronimo Greaves M.D.   On: 10/16/2019 20:01   Dg Chest Port 1 View  Result Date: 10/16/2019 CLINICAL DATA:  Shortness of breath. EXAM: PORTABLE CHEST 1 VIEW COMPARISON:  06/06/2018 FINDINGS: There is chronic marked cardiomegaly. Pulmonary vascularity is normal. Aortic atherosclerosis. Double density at the right heart border may represent a pericardial cyst but appears unchanged. The possibility of a large hiatal hernia should also be considered. There is slight atelectasis at the lung bases which is new since the prior study. No acute bone abnormality. IMPRESSION: 1. New slight atelectasis at the lung bases. 2. Chronic marked cardiomegaly. 3. Possible pericardial cyst at the right heart border. 4. Aortic atherosclerosis. Electronically Signed   By: Francene Boyers M.D.   On: 10/16/2019 18:22        Scheduled Meds:  influenza vaccine adjuvanted  0.5 mL Intramuscular Tomorrow-1000   Continuous Infusions:  dextrose 5 % and 0.9% NaCl 100 mL/hr at 10/17/19 0414   magnesium sulfate bolus IVPB 2 g (10/17/19 1210)   meropenem (MERREM) IV 1 g (10/17/19 0500)   potassium chloride 10 mEq (10/17/19 1209)     LOS: 1 day    Time spent:35 mins. More than 50% of that time was spent in counseling and/or coordination of care.      Burnadette Pop, MD [facility] Pager [phone]  If 7PM-7AM, please contact night-coverage [email or web address] Password [email or web address] 10/17/2019, 12:30 PM

## 2019-10-17 NOTE — Plan of Care (Signed)
[facility] called for consult last night.  She is established patient of Dr. Lucio Edward with [facility].  I have contacted hospitalist letting them know to call [facility] for consult.

## 2019-10-17 NOTE — H&P (View-Only) (Signed)
Consultation  Referring Provider:     Dr. Renford Dills Primary Care Physician:  Julaine Fusi, NP Primary Gastroenterologist: Dr. Russella Dar         Reason for Consultation:   Choledocholithiasis         HPI:   Christina Ortiz is a 85 y.o. female with a past medical history as listed below including CKD, CAD and A. fib on Xarelto (08/22/2019 echo with LVEF 55-60%, no stenosis of the aortic valve), prior cholecystectomy with possible ERCP ("yrs ago") and GERD, who presented to the ER on 10/16/2019 for a complaint of abdominal pain and weakness.    Today, the patient describes that she had epigastric pain associated with some excess gas which seemed to start "out of the blue" about 4 days ago. This seemed to wax and wane in intensity but at it's worst was a 9/10. Was chewing Tums which seemed to help a little but. Over the past 4 days grew increasingly weak and because of this presented to the ER.    Currently, the patient tells me she is not in any pain but does complain of a dry mouth because " I haven't been able to eat anything".    Last dose of Xarelto Monday pm.   Denies fever, chills, weight loss, change in bowel habits, nausea or vomiting.  ER course: WBC 18.8, hemoglobin 12.6 and platelet count of 200, potassium 4.8, creatinine 1.04, LFTs with an AST of 139, ALT of 76, total bilirubin of 6.4, CT abdomen pelvis showed extensive choledocholithiasis with borderline to mild duct dilatation, bilateral pulmonary nodules and coronary artery atherosclerosis, small right pleural effusions  GI history: 11/01/2018 EGD, Dr. Russella Dar: - Benign-appearing esophageal stenosis. Dilated. - Medium-sized hiatal hernia. - Erosive gastropathy. Biopsied. - Periampullary diverticulum. - Otherwise normal duodenal bulb and second portion of the duodenum.  Past Medical History:  Diagnosis Date   Acute bronchitis 10/2017   Arthritis    CKD (chronic kidney disease)    Xarelto dose is 15 mg QD   Coronary  artery disease    a. s/p PCI/stenting to RCA and LAD ([facility] in [place]) in 2006; b. Nuc 1/15: No ischemia, EF 70%   Depression    Gallstones    Gastritis    Hiatal hernia    Hip fracture (HCC)    History of cholecystectomy    History of echocardiogram    a. Echo 1/15: Moderate LVH, EF 55-60%, impaired relaxation, mild AS   Hyperlipidemia    Hypertension    Hypokalemia    Mobitz type 1 second degree atrioventricular block    a. Event Monitor 3/15: NSR, first-degree AV block, second-degree AB block - Mobitz 1, PACs, NSVT (5 beats)   PAF (paroxysmal atrial fibrillation) (HCC)    Thyroid disease     Past Surgical History:  Procedure Laterality Date   ANGIOPLASTY     with sent placement   BLEPHAROPLASTY     BREAST LUMPECTOMY Left    CARDIAC CATHETERIZATION     CATARACT EXTRACTION Bilateral    CHOLECYSTECTOMY     DILATION AND CURETTAGE OF UTERUS      Family History  Problem Relation Age of Onset   Heart disease Mother    Heart attack Mother    Lung cancer Father    Lymphoma Brother    Heart failure Brother    Esophageal cancer Paternal Uncle    Colon cancer Neg Hx    Colon polyps Neg Hx  Rectal cancer Neg Hx    Stomach cancer Neg Hx     Social History   Tobacco Use   Smoking status: Former Smoker    Packs/day: 0.50    Years: 67.00    Pack years: 33.50    Types: Cigarettes    Quit date: 10/24/2014    Years since quitting: 4.9   Smokeless tobacco: Never Used   Tobacco comment: quit 2015  Substance Use Topics   Alcohol use: Yes    Alcohol/week: 10.0 standard drinks    Types: 10 Shots of liquor per week    Comment: 7 a week   Drug use: No    Prior to Admission medications   Medication Sig Start Date End Date Taking? Authorizing Provider  acetaminophen (TYLENOL) 325 MG tablet Take 650 mg by mouth every 6 (six) hours as needed for fever.    Yes [provider]  XARELTO 15 MG TABS tablet TAKE 1 TABLET (15 MG  TOTAL) BY MOUTH DAILY WITH SUPPER. Patient taking differently: Take 15 mg by mouth daily with supper.  01/19/19  Yes Jake BatheSkains, Mark C, MD  amLODipine (NORVASC) 5 MG tablet Take 1 tablet (5 mg total) by mouth daily. 08/03/19 08/02/20  Jake BatheSkains, Mark C, MD  antiseptic oral rinse (BIOTENE) LIQD 15 mLs by Mouth Rinse route as needed for dry mouth.    [provider]  calcitonin, salmon, (MIACALCIN/FORTICAL) 200 UNIT/ACT nasal spray  09/17/19   [provider]  celecoxib (CELEBREX) 200 MG capsule  08/29/19   [provider]  cholecalciferol (VITAMIN D) 1000 units tablet Take 1,000 Units by mouth every evening.    [provider]  ciprofloxacin (CIPRO) 250 MG tablet Take 1 tablet (250 mg total) by mouth 2 (two) times daily. 09/27/19   Danford, Orpha BurKaty D, NP  folic acid (FOLVITE) 1 MG tablet TAKE 1 TABLET BY MOUTH DAILY 09/04/19   Danford, Orpha BurKaty D, NP  furosemide (LASIX) 20 MG tablet Take 1 tablet (20 mg total) by mouth daily. 08/10/19   Jake BatheSkains, Mark C, MD  ipratropium-albuterol (DUONEB) 0.5-2.5 (3) MG/3ML SOLN Take 3 mLs by nebulization as needed.     [provider]  levothyroxine (SYNTHROID) 75 MCG tablet TAKE 1 TABLET BY MOUTH DAILY BEFORE BREAKFAST. 10/10/19   Danford, Orpha BurKaty D, NP  magnesium oxide (MAG-OX) 400 MG tablet Take 400 mg by mouth daily.    [provider]  metoprolol tartrate (LOPRESSOR) 25 MG tablet Take 0.5 tablets (12.5 mg total) by mouth 2 (two) times daily. 07/18/19   Danford, Orpha BurKaty D, NP  multivitamin (PROSIGHT) TABS tablet Take 1 tablet by mouth daily. 06/08/18   Berton Mountgbata, Sylvester I, MD  omeprazole (PRILOSEC) 20 MG capsule TAKE 1 CAPSULE BY MOUTH DAILY AS NEEDED FOR HEARTBURN. 09/04/19   Danford, Orpha BurKaty D, NP  oxybutynin (DITROPAN-XL) 10 MG 24 hr tablet  08/29/19   [provider]  PARoxetine (PAXIL) 40 MG tablet Take 1 tablet (40 mg total) by mouth daily. 07/26/19   Danford, Orpha BurKaty D, NP  polyethylene glycol (MIRALAX / GLYCOLAX) packet Take 17 g by  mouth daily.    [provider]  potassium chloride SA (K-DUR) 20 MEQ tablet Take 1 tablet (20 mEq total) by mouth daily. 08/10/19   Jake BatheSkains, Mark C, MD  Propylene Glycol (SYSTANE BALANCE) 0.6 % SOLN Place 1 drop into both eyes daily.    [provider]  quinapril (ACCUPRIL) 40 MG tablet TAKE 1 TABLET (40 MG TOTAL) BY MOUTH DAILY. 05/14/19  Danford, [name], NP  simvastatin (ZOCOR) 5 MG tablet TAKE 1 TABLET BY MOUTH EVERY EVENING. 10/10/19   Danford, [name], NP  tolterodine (DETROL LA) 4 MG 24 hr capsule Take 1 capsule by mouth daily. 07/11/19   [provider]  traMADol Janean Sark) 50 MG tablet  08/29/19   [provider]    Current Facility-Administered Medications  Medication Dose Route Frequency Provider Last Rate Last Dose   albuterol (VENTOLIN HFA) 108 (90 Base) MCG/ACT inhaler 2 puff  2 puff Inhalation Q4H PRN Loren Racer, MD   2 puff at 10/16/19 1753   dextrose 5 %-0.9 % sodium chloride infusion   Intravenous Continuous Rometta Emery, MD 100 mL/hr at 10/17/19 0414     influenza vaccine adjuvanted (FLUAD) injection 0.5 mL  0.5 mL Intramuscular Tomorrow-1000 Earlie Lou L, MD       labetalol (NORMODYNE) injection 10 mg  10 mg Intravenous Q2H PRN Rometta Emery, MD   10 mg at 10/17/19 1224   magnesium sulfate IVPB 2 g 50 mL  2 g Intravenous Once Burnadette Pop, MD       meropenem (MERREM) 1 g in sodium chloride 0.9 % 100 mL IVPB  1 g Intravenous Q12H Earlie Lou L, MD 200 mL/hr at 10/17/19 0500 1 g at 10/17/19 0500   morphine 2 MG/ML injection 2 mg  2 mg Intravenous Q3H PRN Rometta Emery, MD       ondansetron (ZOFRAN) tablet 4 mg  4 mg Oral Q6H PRN Rometta Emery, MD       Or   ondansetron (ZOFRAN) injection 4 mg  4 mg Intravenous Q6H PRN Rometta Emery, MD       potassium chloride 10 mEq in 100 mL IVPB  10 mEq Intravenous Q1 Hr x 6 Burnadette Pop, MD        Allergies as of 10/16/2019 - Review Complete 10/16/2019    Allergen Reaction Noted   Tape Other (See Comments) 01/23/2017     Review of Systems:    Constitutional: No weight loss, fever or chills Skin: No rash  Cardiovascular: No chest pain   Respiratory: No SOB  Gastrointestinal: See HPI and otherwise negative Genitourinary: No dysuria  Neurological: No headache, dizziness or syncope Musculoskeletal: No new muscle or joint pain Hematologic: No bleeding Psychiatric: No history of depression or anxiety    Physical Exam:  Vital signs in last 24 hours: Temp:  [97.4 F (36.3 C)-98.7 F (37.1 C)] 97.5 F (36.4 C) (10/21 0617) Pulse Rate:  [71-95] 71 (10/21 0642) Resp:  [16-33] 20 (10/21 0617) BP: (142-194)/(89-129) 148/89 (10/21 0642) SpO2:  [93 %-100 %] 98 % (10/21 0642) Weight:  [65 kg-66 kg] 66 kg (10/21 0321) Last BM Date: 10/16/19 General:   Pleasant elderly Caucasian female appears to be in NAD, Well developed, Well nourished, alert and cooperative Head:  Normocephalic and atraumatic. Eyes:   PEERL, EOMI. No icterus. Conjunctiva pink. Ears:  Normal auditory acuity. Neck:  Supple Throat: Oral cavity and pharynx without inflammation, swelling or lesion.  Lungs: Respirations even and unlabored. Lungs clear to auscultation bilaterally.   No wheezes, crackles, or rhonchi.  Heart: Normal S1, S2. No MRG. Regular rate and rhythm. No peripheral edema, cyanosis or pallor.  Abdomen:  Soft, nondistended, mild epigastric and right upper quadrant TTP no rebound or guarding. Normal bowel sounds. No appreciable masses or hepatomegaly. Rectal:  Not performed.  Msk:  Symmetrical without gross deformities. Peripheral pulses intact.  Extremities:  Without edema, no deformity or joint abnormality.  Neurologic:  Alert and  oriented x4;  grossly normal neurologically.  Skin:   Dry and intact without significant lesions or rashes. Psychiatric: Demonstrates good judgement and reason without abnormal affect or behaviors.   LAB RESULTS: Recent Labs     10/16/19 1819 10/17/19 0558  WBC 18.8* 16.7*  HGB 12.6 12.1  HCT 37.5 35.4*  PLT 200 182   BMET Recent Labs    10/16/19 1819 10/17/19 0558  NA 136 137  K 4.8 2.6*  CL 95* 97*  CO2 26 26  GLUCOSE 104* 88  BUN 36* 34*  CREATININE 1.04* 0.90  CALCIUM 8.6* 8.5*   LFT Recent Labs    10/17/19 0558  PROT 6.4*  ALBUMIN 3.0*  AST 74*  ALT 56*  ALKPHOS 348*  BILITOT 4.7*    STUDIES: Ct Chest W Contrast  Result Date: 10/16/2019 CLINICAL DATA:  Four days weakness. Loss of appetite. Possible jaundice. History urinary tract infections. EXAM: CT CHEST, ABDOMEN, AND PELVIS WITH CONTRAST TECHNIQUE: Multidetector CT imaging of the chest, abdomen and pelvis was performed following the standard protocol during bolus administration of intravenous contrast. CONTRAST:  OMNIPAQUE IOHEXOL 300 MG/ML  SOLN COMPARISON:  Chest radiograph 10/16/2019.  Pelvic CT 06/03/2018. FINDINGS: CT CHEST FINDINGS Cardiovascular: Advanced aortic and branch vessel atherosclerosis. Tortuous thoracic aorta. Moderate cardiomegaly with trace pericardial fluid, likely physiologic. Multivessel coronary artery atherosclerosis. No central pulmonary embolism, on this non-dedicated study. Mediastinum/Nodes: No mediastinal or hilar adenopathy. Large hiatal hernia, with greater than 2/3 of the stomach positioned within the chest. Lungs/Pleura: Small right pleural effusion. Trace left pleural thickening. Mild motion and is EKG lead artifact degradation. Moderate centrilobular emphysema. Bibasilar atelectasis. Probable calcified granuloma in the left lower lobe on 69/4. Scattered bilateral pulmonary nodules. Left upper lobe pulmonary nodule of 4 mm on 51/3. A likely calcified right upper lobe pulmonary nodule of 4 mm on 55/4. 4 mm nodule along the right minor fissure is positioned within the right middle lobe and may also be calcified. Musculoskeletal: No acute osseous abnormality. Remote right lower rib fractures. Advanced  thoracolumbar spondylosis CT ABDOMEN PELVIS FINDINGS Hepatobiliary: Mild technique limitations secondary to motion and EKG leads continues into the upper abdomen. No focal liver lesion. Cholecystectomy. Mild intrahepatic duct dilatation. The common duct is borderline dilated for prior cholecystectomy state, 11 mm. There are noncalcified common duct stones including at 11 mm on 66/2 and 8 mm on 67/2. Also coronal images 44 and 47. There is also likely an 8 mm stone just above the ampulla on 71/2. Pancreas: Moderate pancreatic atrophy with multiple cystic foci including at up to 9 mm on 59/2. No acute inflammation. Spleen: Normal in size, without focal abnormality. Adrenals/Urinary Tract: Normal adrenal glands. Mild bilateral renal scarring. Lower pole left renal cyst. Bilateral too small to characterize renal lesions. No hydronephrosis. Normal urinary bladder. Stomach/Bowel: Normal distal stomach. Scattered colonic diverticula. Normal terminal ileum. Normal small bowel. Vascular/Lymphatic: Advanced aortic and branch vessel atherosclerosis. No abdominopelvic adenopathy. Reproductive: Normal uterus and adnexa.  Tubal ligation. Other: No significant free fluid.  Mild pelvic floor laxity. Musculoskeletal: No acute osseous abnormality. IMPRESSION: 1. Extensive choledocholithiasis, with borderline to mild duct dilatation after cholecystectomy. Recommend ERCP. 2. Motion and artifact degradation, as detailed above. 3. Bilateral pulmonary nodules, primarily felt to represent calcified granulomas. Given patient age, these are of questionable clinical significance. 4. Coronary artery atherosclerosis. Aortic Atherosclerosis (ICD10-I70.0). 5. Large hiatal hernia. 6. Small right pleural effusion. 7. Pancreatic  atrophy with multiple cystic foci within, most likely related to side branch ductectasia or pseudocyst. Electronically Signed   By: Abigail Miyamoto M.D.   On: 10/16/2019 20:01   Ct Abdomen Pelvis W Contrast  Result Date:  10/16/2019 CLINICAL DATA:  Four days weakness. Loss of appetite. Possible jaundice. History urinary tract infections. EXAM: CT CHEST, ABDOMEN, AND PELVIS WITH CONTRAST TECHNIQUE: Multidetector CT imaging of the chest, abdomen and pelvis was performed following the standard protocol during bolus administration of intravenous contrast. CONTRAST:  154mL OMNIPAQUE IOHEXOL 300 MG/ML  SOLN COMPARISON:  Chest radiograph 10/16/2019.  Pelvic CT 06/03/2018. FINDINGS: CT CHEST FINDINGS Cardiovascular: Advanced aortic and branch vessel atherosclerosis. Tortuous thoracic aorta. Moderate cardiomegaly with trace pericardial fluid, likely physiologic. Multivessel coronary artery atherosclerosis. No central pulmonary embolism, on this non-dedicated study. Mediastinum/Nodes: No mediastinal or hilar adenopathy. Large hiatal hernia, with greater than 2/3 of the stomach positioned within the chest. Lungs/Pleura: Small right pleural effusion. Trace left pleural thickening. Mild motion and is EKG lead artifact degradation. Moderate centrilobular emphysema. Bibasilar atelectasis. Probable calcified granuloma in the left lower lobe on 69/4. Scattered bilateral pulmonary nodules. Left upper lobe pulmonary nodule of 4 mm on 51/3. A likely calcified right upper lobe pulmonary nodule of 4 mm on 55/4. 4 mm nodule along the right minor fissure is positioned within the right middle lobe and may also be calcified. Musculoskeletal: No acute osseous abnormality. Remote right lower rib fractures. Advanced thoracolumbar spondylosis CT ABDOMEN PELVIS FINDINGS Hepatobiliary: Mild technique limitations secondary to motion and EKG leads continues into the upper abdomen. No focal liver lesion. Cholecystectomy. Mild intrahepatic duct dilatation. The common duct is borderline dilated for prior cholecystectomy state, 11 mm. There are noncalcified common duct stones including at 11 mm on 66/2 and 8 mm on 67/2. Also coronal images 44 and 47. There is also  likely an 8 mm stone just above the ampulla on 71/2. Pancreas: Moderate pancreatic atrophy with multiple cystic foci including at up to 9 mm on 59/2. No acute inflammation. Spleen: Normal in size, without focal abnormality. Adrenals/Urinary Tract: Normal adrenal glands. Mild bilateral renal scarring. Lower pole left renal cyst. Bilateral too small to characterize renal lesions. No hydronephrosis. Normal urinary bladder. Stomach/Bowel: Normal distal stomach. Scattered colonic diverticula. Normal terminal ileum. Normal small bowel. Vascular/Lymphatic: Advanced aortic and branch vessel atherosclerosis. No abdominopelvic adenopathy. Reproductive: Normal uterus and adnexa.  Tubal ligation. Other: No significant free fluid.  Mild pelvic floor laxity. Musculoskeletal: No acute osseous abnormality. IMPRESSION: 1. Extensive choledocholithiasis, with borderline to mild duct dilatation after cholecystectomy. Recommend ERCP. 2. Motion and artifact degradation, as detailed above. 3. Bilateral pulmonary nodules, primarily felt to represent calcified granulomas. Given patient age, these are of questionable clinical significance. 4. Coronary artery atherosclerosis. Aortic Atherosclerosis (ICD10-I70.0). 5. Large hiatal hernia. 6. Small right pleural effusion. 7. Pancreatic atrophy with multiple cystic foci within, most likely related to side branch ductectasia or pseudocyst. Electronically Signed   By: Abigail Miyamoto M.D.   On: 10/16/2019 20:01   Dg Chest Port 1 View  Result Date: 10/16/2019 CLINICAL DATA:  Shortness of breath. EXAM: PORTABLE CHEST 1 VIEW COMPARISON:  06/06/2018 FINDINGS: There is chronic marked cardiomegaly. Pulmonary vascularity is normal. Aortic atherosclerosis. Double density at the right heart border may represent a pericardial cyst but appears unchanged. The possibility of a large hiatal hernia should also be considered. There is slight atelectasis at the lung bases which is new since the prior study. No  acute bone abnormality. IMPRESSION: 1. New  slight atelectasis at the lung bases. 2. Chronic marked cardiomegaly. 3. Possible pericardial cyst at the right heart border. 4. Aortic atherosclerosis. Electronically Signed   By: Francene BoyersJames  Maxwell M.D.   On: 10/16/2019 18:22     Impression / Plan:   Impression: 1.  Choledocholithiasis: As seen on CT with elevated LFTs 2.  A. fib: On Xarelto, this has been held since 10/16/2019 p.m. 3. Hypokalemia: K 2.6 this morning  Plan: 1.  Continue antiemetics and pain control 2.  We will plan for ERCP tomorrow with Dr. Christella HartiganJacobs.  Did discuss risks, benefits, limitations and alternatives and the patient agrees to proceed. 3.  Patient can be on clear liquid diet today and n.p.o. after midnight 4.  Continue to hold Xarelto 5. Agree with K replacement- will need to be in normal range by time of procedure tomorrow. Placed recheck of CMP in the am. 6. Continue abx 7.  Please await any further recommendations from Dr. Rhea BeltonPyrtle later today  Thank you for your kind consultation, we will continue to follow.  Violet BaldyJennifer Lynne [facility]  10/17/2019, 8:54 AM

## 2019-10-17 NOTE — Plan of Care (Signed)
Continue current POC ERCP for tomorrow

## 2019-10-18 ENCOUNTER — Inpatient Hospital Stay (HOSPITAL_COMMUNITY): Payer: Medicare PPO

## 2019-10-18 ENCOUNTER — Inpatient Hospital Stay (HOSPITAL_COMMUNITY): Payer: Medicare PPO | Admitting: Certified Registered Nurse Anesthetist

## 2019-10-18 ENCOUNTER — Encounter (HOSPITAL_COMMUNITY)
Admission: EM | Disposition: A | Discharge status: Home / Self Care | Payer: Self-pay | Source: Home / Self Care | Attending: Internal Medicine

## 2019-10-18 ENCOUNTER — Encounter (HOSPITAL_COMMUNITY): Payer: Self-pay | Admitting: Gastroenterology

## 2019-10-18 DIAGNOSIS — K8050 Calculus of bile duct without cholangitis or cholecystitis without obstruction: Secondary | ICD-10-CM | POA: Diagnosis not present

## 2019-10-18 HISTORY — PX: BILIARY STENT PLACEMENT: SHX5538

## 2019-10-18 HISTORY — PX: ERCP: SHX5425

## 2019-10-18 HISTORY — PX: SPHINCTEROTOMY: SHX5544

## 2019-10-18 HISTORY — PX: BILIARY DILATION: SHX6850

## 2019-10-18 HISTORY — PX: REMOVAL OF STONES: SHX5545

## 2019-10-18 LAB — CBC WITH DIFFERENTIAL/PLATELET
Abs Immature Granulocytes: 0.08 10*3/uL — ABNORMAL HIGH (ref 0.00–0.07)
Basophils Absolute: 0 10*3/uL (ref 0.0–0.1)
Basophils Relative: 0 %
Eosinophils Absolute: 0.1 10*3/uL (ref 0.0–0.5)
Eosinophils Relative: 1 %
HCT: 37.5 % (ref 36.0–46.0)
Hemoglobin: 12.3 g/dL (ref 12.0–15.0)
Immature Granulocytes: 1 %
Lymphocytes Relative: 6 %
Lymphs Abs: 0.6 10*3/uL — ABNORMAL LOW (ref 0.7–4.0)
MCH: 33.2 pg (ref 26.0–34.0)
MCHC: 32.8 g/dL (ref 30.0–36.0)
MCV: 101.1 fL — ABNORMAL HIGH (ref 80.0–100.0)
Monocytes Absolute: 0.5 10*3/uL (ref 0.1–1.0)
Monocytes Relative: 5 %
Neutro Abs: 8.6 10*3/uL — ABNORMAL HIGH (ref 1.7–7.7)
Neutrophils Relative %: 87 %
Platelets: 167 10*3/uL (ref 150–400)
RBC: 3.71 MIL/uL — ABNORMAL LOW (ref 3.87–5.11)
RDW: 15.3 % (ref 11.5–15.5)
WBC: 9.9 10*3/uL (ref 4.0–10.5)
nRBC: 0 % (ref 0.0–0.2)

## 2019-10-18 LAB — COMPREHENSIVE METABOLIC PANEL
ALT: 42 U/L (ref 0–44)
AST: 45 U/L — ABNORMAL HIGH (ref 15–41)
Albumin: 2.6 g/dL — ABNORMAL LOW (ref 3.5–5.0)
Alkaline Phosphatase: 304 U/L — ABNORMAL HIGH (ref 38–126)
Anion gap: 9 (ref 5–15)
BUN: 23 mg/dL (ref 8–23)
CO2: 26 mmol/L (ref 22–32)
Calcium: 8.2 mg/dL — ABNORMAL LOW (ref 8.9–10.3)
Chloride: 102 mmol/L (ref 98–111)
Creatinine, Ser: 0.71 mg/dL (ref 0.44–1.00)
GFR calc Af Amer: >60 mL/min (ref >60)
GFR calc non Af Amer: >60 mL/min (ref >60)
Glucose, Bld: 128 mg/dL — ABNORMAL HIGH (ref 70–99)
Potassium: 2.9 mmol/L — ABNORMAL LOW (ref 3.5–5.1)
Sodium: 137 mmol/L (ref 135–145)
Total Bilirubin: 2.6 mg/dL — ABNORMAL HIGH (ref 0.3–1.2)
Total Protein: 6.1 g/dL — ABNORMAL LOW (ref 6.5–8.1)

## 2019-10-18 LAB — MAGNESIUM: Magnesium: 2 mg/dL (ref 1.7–2.4)

## 2019-10-18 SURGERY — ERCP, WITH INTERVENTION IF INDICATED
Anesthesia: General

## 2019-10-18 MED ORDER — FENTANYL CITRATE (PF) 100 MCG/2ML IJ SOLN
INTRAMUSCULAR | Status: AC
  Filled 2019-10-18: qty 2

## 2019-10-18 MED ORDER — ROCURONIUM BROMIDE 10 MG/ML (PF) SYRINGE
PREFILLED_SYRINGE | INTRAVENOUS | Status: DC | PRN
  Administered 2019-10-18: 10 mg via INTRAVENOUS
  Administered 2019-10-18: 15 mg via INTRAVENOUS
  Administered 2019-10-18: 35 mg via INTRAVENOUS

## 2019-10-18 MED ORDER — SODIUM CHLORIDE 0.9 % IV SOLN: INTRAVENOUS | Status: DC

## 2019-10-18 MED ORDER — SODIUM CHLORIDE 0.9 % IV SOLN
INTRAVENOUS | Status: DC | PRN
  Administered 2019-10-18: 50 mL

## 2019-10-18 MED ORDER — PHENYLEPHRINE 40 MCG/ML (10ML) SYRINGE FOR IV PUSH (FOR BLOOD PRESSURE SUPPORT)
PREFILLED_SYRINGE | INTRAVENOUS | Status: DC | PRN
  Administered 2019-10-18 (×3): 160 ug via INTRAVENOUS
  Administered 2019-10-18 (×3): 120 ug via INTRAVENOUS

## 2019-10-18 MED ORDER — GLUCAGON HCL RDNA (DIAGNOSTIC) 1 MG IJ SOLR
INTRAMUSCULAR | Status: AC
  Filled 2019-10-18: qty 1

## 2019-10-18 MED ORDER — POTASSIUM CHLORIDE 10 MEQ/100ML IV SOLN
10.0000 meq | INTRAVENOUS | Status: AC
  Administered 2019-10-18 (×3): 10 meq via INTRAVENOUS
  Filled 2019-10-18 (×3): qty 100

## 2019-10-18 MED ORDER — ALUM & MAG HYDROXIDE-SIMETH 200-200-20 MG/5ML PO SUSP
30.0000 mL | ORAL | Status: DC | PRN
  Administered 2019-10-18: 30 mL via ORAL
  Filled 2019-10-18: qty 30

## 2019-10-18 MED ORDER — LABETALOL HCL 5 MG/ML IV SOLN
INTRAVENOUS | Status: AC
  Filled 2019-10-18: qty 4

## 2019-10-18 MED ORDER — PROPOFOL 10 MG/ML IV BOLUS
INTRAVENOUS | Status: DC | PRN
  Administered 2019-10-18: 100 mg via INTRAVENOUS

## 2019-10-18 MED ORDER — INDOMETHACIN 50 MG RE SUPP: 100.0000 mg | Freq: Once | RECTAL | Status: DC

## 2019-10-18 MED ORDER — DEXAMETHASONE SODIUM PHOSPHATE 10 MG/ML IJ SOLN
INTRAMUSCULAR | Status: DC | PRN
  Administered 2019-10-18: 10 mg via INTRAVENOUS

## 2019-10-18 MED ORDER — INDOMETHACIN 50 MG RE SUPP
RECTAL | Status: AC
  Filled 2019-10-18: qty 2

## 2019-10-18 MED ORDER — LABETALOL HCL 5 MG/ML IV SOLN
20.0000 mg | Freq: Once | INTRAVENOUS | Status: AC
  Administered 2019-10-18: 10 mg via INTRAVENOUS

## 2019-10-18 MED ORDER — GLUCAGON HCL RDNA (DIAGNOSTIC) 1 MG IJ SOLR
INTRAMUSCULAR | Status: DC | PRN
  Administered 2019-10-18 (×2): .5 mL via INTRAVENOUS

## 2019-10-18 MED ORDER — SUCCINYLCHOLINE CHLORIDE 200 MG/10ML IV SOSY
PREFILLED_SYRINGE | INTRAVENOUS | Status: DC | PRN
  Administered 2019-10-18: 80 mg via INTRAVENOUS

## 2019-10-18 MED ORDER — LIDOCAINE 2% (20 MG/ML) 5 ML SYRINGE
INTRAMUSCULAR | Status: DC | PRN
  Administered 2019-10-18: 60 mg via INTRAVENOUS

## 2019-10-18 MED ORDER — INDOMETHACIN 50 MG RE SUPP
RECTAL | Status: DC | PRN
  Administered 2019-10-18: 100 mg via RECTAL

## 2019-10-18 MED ORDER — FENTANYL CITRATE (PF) 100 MCG/2ML IJ SOLN
INTRAMUSCULAR | Status: DC | PRN
  Administered 2019-10-18: 50 ug via INTRAVENOUS
  Administered 2019-10-18 (×2): 25 ug via INTRAVENOUS

## 2019-10-18 MED ORDER — ONDANSETRON HCL 4 MG/2ML IJ SOLN
INTRAMUSCULAR | Status: DC | PRN
  Administered 2019-10-18: 4 mg via INTRAVENOUS

## 2019-10-18 MED ORDER — LACTATED RINGERS IV SOLN
INTRAVENOUS | Status: DC | PRN
  Administered 2019-10-18: 12:00:00 via INTRAVENOUS

## 2019-10-18 MED ORDER — SODIUM CHLORIDE 0.9 % IV SOLN
INTRAVENOUS | Status: DC | PRN
  Administered 2019-10-18: 12:00:00 50 ug/min via INTRAVENOUS

## 2019-10-18 MED ORDER — SUGAMMADEX SODIUM 200 MG/2ML IV SOLN
INTRAVENOUS | Status: DC | PRN
  Administered 2019-10-18: 200 mg via INTRAVENOUS

## 2019-10-18 NOTE — Progress Notes (Signed)
PROGRESS NOTE    Christina Ortiz  JLL:[id number] DOB: 12-30-1928 DOA: 10/16/2019 PCP: Julaine Fusi, NP   Brief Narrative:  Patient is 85 year old female with history of CKD stage III, osteoarthritis, paroxysmal A. fib on Xarelto, coronary disease, status post cholecystectomy, GERD, hypertension, hyperlipidemia who presented to the emergency department with complaints of generalized weakness, abdominal pain, nausea but no vomiting.  Work-up in the emergency department showed increased LFTs including bilirubin more than 6.  Imaging showed choledocholithiasis.  GI consulted.  Plan for ERCP today.  Assessment & Plan:   Principal Problem:   Choledocholithiasis Active Problems:   Coronary artery disease due to lipid rich plaque   Essential hypertension   Hyperlipidemia   Chronic anticoagulation   Depression, recurrent (HCC)   PAF (paroxysmal atrial fibrillation) (HCC)   Coronary artery disease   Hypothyroidism   Elevated LFTs   Choledocholithiasis: Patient is a status post cholecystectomy.  Continue n.p.o. status.  Also started on IV antibiotic to cover for possible cholangitis.  Has leukocytosis.  Afebrile.  Elevated liver enzymes but improving.  We will continue to monitor liver enzymes.  GI planning for ERCP today.  Currently she denies any abdomen pain, nausea or vomiting. CT imaging showed extensive choledocholithiasis, with borderline to mild duct dilatation after cholecystectomy.  Hypokalemia/hypomagnesemia: Being supplemented.  We will continue to monitor.  A. fib: Currently rate is controlled.  Was on Xarelto for anticoagulation which is on hold.  Hypertension: Hypertensive.  Continue as needed meds as needed.  Will resume her home medicines as soon as possible.  Coronary artery disease: Stable.  Hyperlipidemia: On statin at home.  Depression/anxiety: Currently stable.         DVT prophylaxis:Lovenox Code Status: Full Family Communication: Called daughter on  phone on 10/17/19 Disposition Plan: Home after full work-up, GI clearance   Consultants: GI  Procedures: None  Antimicrobials:  Anti-infectives (From admission, onward)   Start     Dose/Rate Route Frequency Ordered Stop   10/17/19 1800  [MAR Hold]  piperacillin-tazobactam (ZOSYN) IVPB 3.375 g     (MAR Hold since Thu 10/18/2019 at 1117.Hold Reason: Transfer to a Procedural area.)   3.375 g 12.5 mL/hr over 240 Minutes Intravenous Every 8 hours 10/17/19 1410     10/17/19 0600  meropenem (MERREM) 1 g in sodium chloride 0.9 % 100 mL IVPB  Status:  Discontinued     1 g 200 mL/hr over 30 Minutes Intravenous Every 12 hours 10/17/19 0322 10/17/19 1410   10/16/19 2115  piperacillin-tazobactam (ZOSYN) IVPB 3.375 g     3.375 g 100 mL/hr over 30 Minutes Intravenous  Once 10/16/19 2102 10/17/19 0246      Subjective:  Patient seen and examined at bedside this morning.  Hemodynamically stable.  Denies any abdomen pain, nausea or vomiting.   Objective: Vitals:   10/17/19 2117 10/18/19 0515 10/18/19 1020 10/18/19 1124  BP: (!) 169/109 (!) 186/115 (!) 183/120 (!) 185/112  Pulse: 67 79 89 81  Resp: 20 20  (!) 25  Temp: 97.7 F (36.5 C) 98 F (36.7 C) 97.9 F (36.6 C) 97.7 F (36.5 C)  TempSrc: Oral Axillary Oral Oral  SpO2: 98% 100% 99% 100%  Weight:    66 kg  Height:    5\' 1"  (1.549 m)    Intake/Output Summary (Last 24 hours) at 10/18/2019 1247 Last data filed at 10/18/2019 0600 Gross per 24 hour  Intake 2306.42 ml  Output 375 ml  Net 1931.42 ml   [employer]  10/16/19 1652 10/17/19 0321 10/18/19 1124  Weight: 65 kg 66 kg 66 kg    Examination:  General exam: Pleasant elderly lady, HEENT:PERRL,Oral mucosa moist, Ear/Nose normal on gross exam Respiratory system: Bilateral equal air entry, normal vesicular breath sounds, no wheezes or crackles  Cardiovascular system: S1 & S2 heard, RRR. No JVD, murmurs, rubs, gallops or clicks. No pedal edema. Gastrointestinal system:  Abdomen is nondistended, soft and has mild tenderness in the right upper quadrant. No organomegaly or masses felt. Normal bowel sounds heard. Central nervous system: Alert and oriented. No focal neurological deficits. Extremities: No edema, no clubbing ,no cyanosis, distal peripheral pulses palpable. Skin: No rashes, lesions or ulcers,no icterus ,no pallor    Data Reviewed: I have personally reviewed following labs and imaging studies  CBC: Recent Labs  Lab 10/16/19 1819 10/17/19 0558 10/18/19 0249  WBC 18.8* 16.7* 9.9  NEUTROABS 16.8*  --  8.6*  HGB 12.6 12.1 12.3  HCT 37.5 35.4* 37.5  MCV 101.1* 99.7 101.1*  PLT 200 182 828   Basic Metabolic Panel: Recent Labs  Lab 10/16/19 1819 10/17/19 0558 10/17/19 1441 10/18/19 0249  NA 136 137  --  137  K 4.8 2.6* 3.3* 2.9*  CL 95* 97*  --  102  CO2 26 26  --  26  GLUCOSE 104* 88  --  128*  BUN 36* 34*  --  23  CREATININE 1.04* 0.90  --  0.71  CALCIUM 8.6* 8.5*  --  8.2*  MG  --  1.5*  --  2.0   GFR: Estimated Creatinine Clearance: 42.3 mL/min (by C-G formula based on SCr of 0.71 mg/dL). Liver Function Tests: Recent Labs  Lab 10/16/19 1819 10/17/19 0558 10/18/19 0249  AST 139* 74* 45*  ALT 76* 56* 42  ALKPHOS 377* 348* 304*  BILITOT 6.4* 4.7* 2.6*  PROT 6.6 6.4* 6.1*  ALBUMIN 3.3* 3.0* 2.6*   Recent Labs  Lab 10/16/19 1819  LIPASE 17   No results for input(s): AMMONIA in the last 168 hours. Coagulation Profile: No results for input(s): INR, PROTIME in the last 168 hours. Cardiac Enzymes: No results for input(s): CKTOTAL, CKMB, CKMBINDEX, TROPONINI in the last 168 hours. BNP (last 3 results) No results for input(s): PROBNP in the last 8760 hours. HbA1C: No results for input(s): HGBA1C in the last 72 hours. CBG: No results for input(s): GLUCAP in the last 168 hours. Lipid Profile: No results for input(s): CHOL, HDL, LDLCALC, TRIG, CHOLHDL, LDLDIRECT in the last 72 hours. Thyroid Function Tests: No results  for input(s): TSH, T4TOTAL, FREET4, T3FREE, THYROIDAB in the last 72 hours. Anemia Panel: No results for input(s): VITAMINB12, FOLATE, FERRITIN, TIBC, IRON, RETICCTPCT in the last 72 hours. Sepsis Labs: No results for input(s): PROCALCITON, LATICACIDVEN in the last 168 hours.  Recent Results (from the past 240 hour(s))  SARS CORONAVIRUS 2 (TAT 6-24 HRS) Nasopharyngeal Nasopharyngeal Swab     Status: None   Collection Time: 10/16/19  6:12 PM   Specimen: Nasopharyngeal Swab  Result Value Ref Range Status   SARS Coronavirus 2 NEGATIVE NEGATIVE Final    Comment: (NOTE) SARS-CoV-2 target nucleic acids are NOT DETECTED. The SARS-CoV-2 RNA is generally detectable in upper and lower respiratory specimens during the acute phase of infection. Negative results do not preclude SARS-CoV-2 infection, do not rule out co-infections with other pathogens, and should not be used as the sole basis for treatment or other patient management decisions. Negative results must be combined with clinical observations, patient history,  and epidemiological information. The expected result is Negative. Fact Sheet for Patients: HairSlick.no Fact Sheet for Healthcare Providers: quierodirigir.com This test is not yet approved or cleared by the [place] FDA and  has been authorized for detection and/or diagnosis of SARS-CoV-2 by FDA under an Emergency Use Authorization (EUA). This EUA will remain  in effect (meaning this test can be used) for the duration of the COVID-19 declaration under Section 56 4(b)(1) of the Act, 21 U.S.C. section 360bbb-3(b)(1), unless the authorization is terminated or revoked sooner. Performed at [facility], [address], [place], [place] [address]          Radiology Studies: Ct Chest W Contrast  Result Date: 10/16/2019 CLINICAL DATA:  Four days weakness. Loss of appetite. Possible jaundice. History urinary  tract infections. EXAM: CT CHEST, ABDOMEN, AND PELVIS WITH CONTRAST TECHNIQUE: Multidetector CT imaging of the chest, abdomen and pelvis was performed following the standard protocol during bolus administration of intravenous contrast. CONTRAST:  OMNIPAQUE IOHEXOL 300 MG/ML  SOLN COMPARISON:  Chest radiograph 10/16/2019.  Pelvic CT 06/03/2018. FINDINGS: CT CHEST FINDINGS Cardiovascular: Advanced aortic and branch vessel atherosclerosis. Tortuous thoracic aorta. Moderate cardiomegaly with trace pericardial fluid, likely physiologic. Multivessel coronary artery atherosclerosis. No central pulmonary embolism, on this non-dedicated study. Mediastinum/Nodes: No mediastinal or hilar adenopathy. Large hiatal hernia, with greater than 2/3 of the stomach positioned within the chest. Lungs/Pleura: Small right pleural effusion. Trace left pleural thickening. Mild motion and is EKG lead artifact degradation. Moderate centrilobular emphysema. Bibasilar atelectasis. Probable calcified granuloma in the left lower lobe on 69/4. Scattered bilateral pulmonary nodules. Left upper lobe pulmonary nodule of 4 mm on 51/3. A likely calcified right upper lobe pulmonary nodule of 4 mm on 55/4. 4 mm nodule along the right minor fissure is positioned within the right middle lobe and may also be calcified. Musculoskeletal: No acute osseous abnormality. Remote right lower rib fractures. Advanced thoracolumbar spondylosis CT ABDOMEN PELVIS FINDINGS Hepatobiliary: Mild technique limitations secondary to motion and EKG leads continues into the upper abdomen. No focal liver lesion. Cholecystectomy. Mild intrahepatic duct dilatation. The common duct is borderline dilated for prior cholecystectomy state, 11 mm. There are noncalcified common duct stones including at 11 mm on 66/2 and 8 mm on 67/2. Also coronal images 44 and 47. There is also likely an 8 mm stone just above the ampulla on 71/2. Pancreas: Moderate pancreatic atrophy with multiple  cystic foci including at up to 9 mm on 59/2. No acute inflammation. Spleen: Normal in size, without focal abnormality. Adrenals/Urinary Tract: Normal adrenal glands. Mild bilateral renal scarring. Lower pole left renal cyst. Bilateral too small to characterize renal lesions. No hydronephrosis. Normal urinary bladder. Stomach/Bowel: Normal distal stomach. Scattered colonic diverticula. Normal terminal ileum. Normal small bowel. Vascular/Lymphatic: Advanced aortic and branch vessel atherosclerosis. No abdominopelvic adenopathy. Reproductive: Normal uterus and adnexa.  Tubal ligation. Other: No significant free fluid.  Mild pelvic floor laxity. Musculoskeletal: No acute osseous abnormality. IMPRESSION: 1. Extensive choledocholithiasis, with borderline to mild duct dilatation after cholecystectomy. Recommend ERCP. 2. Motion and artifact degradation, as detailed above. 3. Bilateral pulmonary nodules, primarily felt to represent calcified granulomas. Given patient age, these are of questionable clinical significance. 4. Coronary artery atherosclerosis. Aortic Atherosclerosis (ICD10-I70.0). 5. Large hiatal hernia. 6. Small right pleural effusion. 7. Pancreatic atrophy with multiple cystic foci within, most likely related to side branch ductectasia or pseudocyst. Electronically Signed   By: Jeronimo Greaves M.D.   On: 10/16/2019 20:01   Ct Abdomen  Pelvis W Contrast  Result Date: 10/16/2019 CLINICAL DATA:  Four days weakness. Loss of appetite. Possible jaundice. History urinary tract infections. EXAM: CT CHEST, ABDOMEN, AND PELVIS WITH CONTRAST TECHNIQUE: Multidetector CT imaging of the chest, abdomen and pelvis was performed following the standard protocol during bolus administration of intravenous contrast. CONTRAST:  100mL OMNIPAQUE IOHEXOL 300 MG/ML  SOLN COMPARISON:  Chest radiograph 10/16/2019.  Pelvic CT 06/03/2018. FINDINGS: CT CHEST FINDINGS Cardiovascular: Advanced aortic and branch vessel atherosclerosis.  Tortuous thoracic aorta. Moderate cardiomegaly with trace pericardial fluid, likely physiologic. Multivessel coronary artery atherosclerosis. No central pulmonary embolism, on this non-dedicated study. Mediastinum/Nodes: No mediastinal or hilar adenopathy. Large hiatal hernia, with greater than 2/3 of the stomach positioned within the chest. Lungs/Pleura: Small right pleural effusion. Trace left pleural thickening. Mild motion and is EKG lead artifact degradation. Moderate centrilobular emphysema. Bibasilar atelectasis. Probable calcified granuloma in the left lower lobe on 69/4. Scattered bilateral pulmonary nodules. Left upper lobe pulmonary nodule of 4 mm on 51/3. A likely calcified right upper lobe pulmonary nodule of 4 mm on 55/4. 4 mm nodule along the right minor fissure is positioned within the right middle lobe and may also be calcified. Musculoskeletal: No acute osseous abnormality. Remote right lower rib fractures. Advanced thoracolumbar spondylosis CT ABDOMEN PELVIS FINDINGS Hepatobiliary: Mild technique limitations secondary to motion and EKG leads continues into the upper abdomen. No focal liver lesion. Cholecystectomy. Mild intrahepatic duct dilatation. The common duct is borderline dilated for prior cholecystectomy state, 11 mm. There are noncalcified common duct stones including at 11 mm on 66/2 and 8 mm on 67/2. Also coronal images 44 and 47. There is also likely an 8 mm stone just above the ampulla on 71/2. Pancreas: Moderate pancreatic atrophy with multiple cystic foci including at up to 9 mm on 59/2. No acute inflammation. Spleen: Normal in size, without focal abnormality. Adrenals/Urinary Tract: Normal adrenal glands. Mild bilateral renal scarring. Lower pole left renal cyst. Bilateral too small to characterize renal lesions. No hydronephrosis. Normal urinary bladder. Stomach/Bowel: Normal distal stomach. Scattered colonic diverticula. Normal terminal ileum. Normal small bowel.  Vascular/Lymphatic: Advanced aortic and branch vessel atherosclerosis. No abdominopelvic adenopathy. Reproductive: Normal uterus and adnexa.  Tubal ligation. Other: No significant free fluid.  Mild pelvic floor laxity. Musculoskeletal: No acute osseous abnormality. IMPRESSION: 1. Extensive choledocholithiasis, with borderline to mild duct dilatation after cholecystectomy. Recommend ERCP. 2. Motion and artifact degradation, as detailed above. 3. Bilateral pulmonary nodules, primarily felt to represent calcified granulomas. Given patient age, these are of questionable clinical significance. 4. Coronary artery atherosclerosis. Aortic Atherosclerosis (ICD10-I70.0). 5. Large hiatal hernia. 6. Small right pleural effusion. 7. Pancreatic atrophy with multiple cystic foci within, most likely related to side branch ductectasia or pseudocyst. Electronically Signed   By: Jeronimo GreavesKyle  Talbot M.D.   On: 10/16/2019 20:01   Dg Chest Port 1 View  Result Date: 10/16/2019 CLINICAL DATA:  Shortness of breath. EXAM: PORTABLE CHEST 1 VIEW COMPARISON:  06/06/2018 FINDINGS: There is chronic marked cardiomegaly. Pulmonary vascularity is normal. Aortic atherosclerosis. Double density at the right heart border may represent a pericardial cyst but appears unchanged. The possibility of a large hiatal hernia should also be considered. There is slight atelectasis at the lung bases which is new since the prior study. No acute bone abnormality. IMPRESSION: 1. New slight atelectasis at the lung bases. 2. Chronic marked cardiomegaly. 3. Possible pericardial cyst at the right heart border. 4. Aortic atherosclerosis. Electronically Signed   By: Francene BoyersJames  Maxwell M.D.   On:  10/16/2019 18:22        Scheduled Meds:  [MAR Hold] enoxaparin (LOVENOX) injection  40 mg Subcutaneous Q24H   influenza vaccine adjuvanted  0.5 mL Intramuscular Tomorrow-1000   Continuous Infusions:  dextrose 5 % and 0.9% NaCl 75 mL/hr at 10/17/19 1311   [MAR Hold]  piperacillin-tazobactam (ZOSYN)  IV 3.375 g (10/18/19 0516)   [MAR Hold] potassium chloride 10 mEq (10/18/19 1102)     LOS: 2 days    Time spent:35 mins. More than 50% of that time was spent in counseling and/or coordination of care.      Burnadette PopAmrit Ivonne Freeburg, MD [facility] Pager [phone]  If 7PM-7AM, please contact night-coverage [email or web address] Password [email or web address] 10/18/2019, 12:47 PM

## 2019-10-18 NOTE — Anesthesia Preprocedure Evaluation (Addendum)
Anesthesia Evaluation  Patient identified by MRN, date of birth, ID band Patient awake    Reviewed: Allergy & Precautions, NPO status , Patient's Chart, lab work & pertinent test results  Airway Mallampati: I       Dental  (+) Poor Dentition, Edentulous Upper   Pulmonary former smoker,    Pulmonary exam normal breath sounds clear to auscultation       Cardiovascular hypertension, + CAD and + Cardiac Stents  Normal cardiovascular exam Rhythm:Regular Rate:Normal     Neuro/Psych    GI/Hepatic   Endo/Other    Renal/GU      Musculoskeletal   Abdominal Normal abdominal exam  (+)   Peds  Hematology   Anesthesia Other Findings pump function of heart is normal.  There is evidence of stiffness in relaxation which can lead to increase edema  No significant valvular abnormalities   Lets continue our course of treatment.  Candee Furbish, MD       Reproductive/Obstetrics                            Anesthesia Physical Anesthesia Plan  ASA: III  Anesthesia Plan: General   Post-op Pain Management:    Induction: Intravenous  PONV Risk Score and Plan: 3 and Ondansetron  Airway Management Planned: Oral ETT  Additional Equipment: None  Intra-op Plan:   Post-operative Plan: Extubation in OR  Informed Consent: I have reviewed the patients History and Physical, chart, labs and discussed the procedure including the risks, benefits and alternatives for the proposed anesthesia with the patient or authorized representative who has indicated his/her understanding and acceptance.     Dental advisory given  Plan Discussed with: CRNA  Anesthesia Plan Comments:         Anesthesia Quick Evaluation

## 2019-10-18 NOTE — Anesthesia Postprocedure Evaluation (Signed)
Anesthesia Post Note  Patient: Christina Ortiz  Procedure(s) Performed: ENDOSCOPIC RETROGRADE CHOLANGIOPANCREATOGRAPHY (ERCP) (N/A ) SPHINCTEROTOMY REMOVAL OF STONES BILIARY DILATION BILIARY STENT PLACEMENT (N/A )     Patient location during evaluation: Endoscopy Anesthesia Type: General Level of consciousness: awake and sedated Pain management: pain level controlled Vital Signs Assessment: post-procedure vital signs reviewed and stable Respiratory status: spontaneous breathing Cardiovascular status: blood pressure returned to baseline Postop Assessment: no apparent nausea or vomiting Anesthetic complications: no    Last Vitals:  Vitals:   10/18/19 1430 10/18/19 1440  BP: (!) 160/110 (!) 157/104  Pulse: 74 76  Resp: 15 15  Temp:    SpO2: 98% 97%    Last Pain:  Vitals:   10/18/19 1440  TempSrc:   PainSc: 0-No pain   Pain Goal:                   Huston Foley

## 2019-10-18 NOTE — Op Note (Signed)
[facility] Patient Name: Christina Ortiz Procedure Date: 10/18/2019 MRN: [id number] Attending MD: Milus Banister , MD Date of Birth: Mar 11, 1929 CSN: [id number] Age: 85 Admit Type: Inpatient Procedure:                ERCP Indications:              admitted with pain, elevated liver tests, CT scan                            showed CBD stone(s), remote cholecystectomy Providers:                Milus Banister, MD, Josie Dixon, RN, Cherylynn Ridges, Technician Referring MD:              Medicines:                General Anesthesia, IV Zosyn, Indomethacin 100mg  PR Complications:            No immediate complications. Estimated blood loss:                            None Estimated Blood Loss:     Estimated blood loss: none. Procedure:                Pre-Anesthesia Assessment:                           - Prior to the procedure, a History and Physical                            was performed, and patient medications and                            allergies were reviewed. The patient's tolerance of                            previous anesthesia was also reviewed. The risks                            and benefits of the procedure and the sedation                            options and risks were discussed with the patient.                            All questions were answered, and informed consent                            was obtained. Prior Anticoagulants: The patient has                            taken Xarelto (rivaroxaban), last dose was 3 days  prior to procedure. ASA Grade Assessment: III - A                            patient with severe systemic disease. After                            reviewing the risks and benefits, the patient was                            deemed in satisfactory condition to undergo the                            procedure.                           After obtaining informed consent, the  scope was                            passed under direct vision. Throughout the                            procedure, the patient's blood pressure, pulse, and                            oxygen saturations were monitored continuously. The                            TJF-Q180V ([id number]) Olympus duodenoscope was                            introduced through the mouth, and used to inject                            contrast into and used to cannulate the bile duct.                            The ERCP was accomplished without difficulty. The                            patient tolerated the procedure well. Scope In: Scope Out: Findings:      Scout film showed surgical clips in the RUQ. The duodenoscope was       advanced to the region of the major papilla without detailed examination       of the UGI tract. There was an obvious, large hiatal hernia however. The       major papilla lay within a 'bilobed' duodenal diverticulum. A 44       Autotome over a .035 hydrawire was used to cannulate the bile duct and       contrast was injected. Cholangiogram showed diffusely dilated and       tortuous extrahepatic biliary tree. No obvious stricture were noted.       There was a large (1cm across), cubical mobile filling defect in the CHD       consistent with a gallstone as well as several other smaller mobile  filling defects in the CBD. A biliar sphinctertomy was performed over       the biliary wire however I could not extend this as far as I felt was       necessary to remove the larger stone and so I used a biliary Hurricane       dilating balloon for further dilation at the ampulla. I then used a       9-8212mm and a 12-1915mm biliary retrieval balloon to sweep the duct several       times. I removed a good amount of soft brown stone debris as well as       some small stone fragments however I was unable to pull the larger stone       into the distal duct, it seemed to hang at the site of  significant       biliary tree tortuosity (related to the duodenal diverticulum?). I       attempted to engage the stone with a 3cm lithotriptor basket without       success but was able to grind the stone down a bit using the basked and       then delivered more soft stone fragments into the duodenum using a       retrieval balloon. Occlusion cholangiogram showed the larger stone was       still present and at that point I elected to place a 7cm long 10Fr       plastic biliary stent in position with the proximal edge clearly above       the remaining stone and the distal edge extending into the duodenum by       1cm. The main pancreatic duct was never cannulated with the wire or       injected with dye. Impression:               - Choledocholithiasis, partially treated with                            biliary sphincterotomy, balloon dilation of the                            ampulla, balloon sweeping and then eventual biliary                            stent placement. Moderate Sedation:      Not Applicable - Patient had care per Anesthesia. Recommendation:           - Return patient to hospital ward for ongoing care.                           - Ok to change to oral antibiotics tomorrow and                            possible d/c home pending recovery from the ERCP.                           - She will need repeat ERCP in 6-8 weeks to attempt                            to complete  removal of the biliary stones. Procedure Code(s):        --- Professional ---                           [phone], Endoscopic retrograde                            cholangiopancreatography (ERCP); with                            sphincterotomy/papillotomy Diagnosis Code(s):        --- Professional ---                           K80.50, Calculus of bile duct without cholangitis                            or cholecystitis without obstruction CPT copyright 2019 [employer]. All rights reserved. The  codes documented in this report are preliminary and upon coder review may  be revised to meet current compliance requirements. Rachael Fee, MD 10/18/2019 2:00:58 PM This report has been signed electronically. Number of Addenda: 0

## 2019-10-18 NOTE — Transfer of Care (Signed)
Immediate Anesthesia Transfer of Care Note  Patient: Christina Ortiz  Procedure(s) Performed: ENDOSCOPIC RETROGRADE CHOLANGIOPANCREATOGRAPHY (ERCP) (N/A ) SPHINCTEROTOMY REMOVAL OF STONES BILIARY DILATION BILIARY STENT PLACEMENT (N/A )  Patient Location: Endoscopy Unit  Anesthesia Type:General  Level of Consciousness: awake, patient cooperative and responds to stimulation  Airway & Oxygen Therapy: Patient Spontanous Breathing and Patient connected to face mask oxygen  Post-op Assessment: Report given to RN and Post -op Vital signs reviewed and stable  Post vital signs: Reviewed and stable  Last Vitals:  Vitals Value Taken Time  BP 189/148 10/18/19 1405  Temp 36.7 C 10/18/19 1404  Pulse 84 10/18/19 1407  Resp 15 10/18/19 1407  SpO2 98 % 10/18/19 1407  Vitals shown include unvalidated device data.  Last Pain:  Vitals:   10/18/19 1404  TempSrc: Temporal  PainSc: Asleep         Complications: No apparent anesthesia complications

## 2019-10-18 NOTE — Anesthesia Procedure Notes (Addendum)
Procedure Name: Intubation Date/Time: 10/18/2019 12:05 PM Performed by: Silas Sacramento, CRNA Pre-anesthesia Checklist: Patient identified, Emergency Drugs available, Suction available and Patient being monitored Patient Re-evaluated:Patient Re-evaluated prior to induction Oxygen Delivery Method: Circle system utilized Preoxygenation: Pre-oxygenation with 100% oxygen Induction Type: Rapid sequence and IV induction Laryngoscope Size: Glidescope and 3 Grade View: Grade I Tube type: Oral Tube size: 7.0 mm Number of attempts: 1 Airway Equipment and Method: Stylet Placement Confirmation: ETT inserted through vocal cords under direct vision,  positive ETCO2 and breath sounds checked- equal and bilateral Secured at: 22 cm Tube secured with: Tape Dental Injury: Teeth and Oropharynx as per pre-operative assessment

## 2019-10-18 NOTE — Interval H&P Note (Signed)
History and Physical Interval Note:  10/18/2019 11:06 AM  Christina Ortiz  has presented today for surgery, with the diagnosis of Choledocholithiasis.  The various methods of treatment have been discussed with the patient and family. After consideration of risks, benefits and other options for treatment, the patient has consented to  Procedure(s): ENDOSCOPIC RETROGRADE CHOLANGIOPANCREATOGRAPHY (ERCP) (N/A) as a surgical intervention.  The patient's history has been reviewed, patient examined, no change in status, stable for surgery.  I have reviewed the patient's chart and labs.  Questions were answered to the patient's satisfaction.     Milus Banister

## 2019-10-19 ENCOUNTER — Encounter (HOSPITAL_COMMUNITY): Payer: Self-pay | Admitting: Gastroenterology

## 2019-10-19 LAB — COMPREHENSIVE METABOLIC PANEL
ALT: 33 U/L (ref 0–44)
AST: 31 U/L (ref 15–41)
Albumin: 2.4 g/dL — ABNORMAL LOW (ref 3.5–5.0)
Alkaline Phosphatase: 255 U/L — ABNORMAL HIGH (ref 38–126)
Anion gap: 10 (ref 5–15)
BUN: 31 mg/dL — ABNORMAL HIGH (ref 8–23)
CO2: 25 mmol/L (ref 22–32)
Calcium: 8.1 mg/dL — ABNORMAL LOW (ref 8.9–10.3)
Chloride: 103 mmol/L (ref 98–111)
Creatinine, Ser: 1.21 mg/dL — ABNORMAL HIGH (ref 0.44–1.00)
GFR calc Af Amer: 46 mL/min — ABNORMAL LOW (ref >60)
GFR calc non Af Amer: 40 mL/min — ABNORMAL LOW (ref >60)
Glucose, Bld: 182 mg/dL — ABNORMAL HIGH (ref 70–99)
Potassium: 3.8 mmol/L (ref 3.5–5.1)
Sodium: 138 mmol/L (ref 135–145)
Total Bilirubin: 2 mg/dL — ABNORMAL HIGH (ref 0.3–1.2)
Total Protein: 5.9 g/dL — ABNORMAL LOW (ref 6.5–8.1)

## 2019-10-19 MED ORDER — RIVAROXABAN 15 MG PO TABS: 15.0000 mg | ORAL_TABLET | Freq: Every day | ORAL | Status: DC

## 2019-10-19 MED ORDER — METOPROLOL TARTRATE 12.5 MG HALF TABLET
12.5000 mg | ORAL_TABLET | Freq: Two times a day (BID) | ORAL | Status: DC
  Administered 2019-10-19: 12.5 mg via ORAL
  Filled 2019-10-19: qty 1

## 2019-10-19 MED ORDER — AMLODIPINE BESYLATE 5 MG PO TABS
5.0000 mg | ORAL_TABLET | Freq: Every day | ORAL | Status: DC
  Administered 2019-10-19: 5 mg via ORAL
  Filled 2019-10-19: qty 1

## 2019-10-19 MED ORDER — QUINAPRIL HCL 10 MG PO TABS: 40.0000 mg | ORAL_TABLET | Freq: Every day | ORAL | Status: DC

## 2019-10-19 MED ORDER — LISINOPRIL 20 MG PO TABS
40.0000 mg | ORAL_TABLET | Freq: Every day | ORAL | Status: DC
  Administered 2019-10-19: 40 mg via ORAL
  Filled 2019-10-19: qty 2

## 2019-10-19 MED ORDER — FUROSEMIDE 20 MG PO TABS
20.0000 mg | ORAL_TABLET | Freq: Every day | ORAL | Status: DC
  Administered 2019-10-19: 20 mg via ORAL
  Filled 2019-10-19: qty 1

## 2019-10-19 MED ORDER — AMOXICILLIN-POT CLAVULANATE 875-125 MG PO TABS: 1.0000 | ORAL_TABLET | Freq: Two times a day (BID) | ORAL | 0 refills | Status: AC

## 2019-10-19 MED ORDER — ENOXAPARIN SODIUM 30 MG/0.3ML ~~LOC~~ SOLN: 30.0000 mg | SUBCUTANEOUS | Status: DC

## 2019-10-19 NOTE — Progress Notes (Signed)
Lovenox per Pharmacy for DVT Prophylaxis    Pharmacy has been consulted from dosing enoxaparin (lovenox) in this patient for DVT prophylaxis.  The pharmacist has reviewed pertinent labs (Hgb _12.3__; PLT__167_), patient weight (__66_kg) and renal function (CrCl_28__mL/min) and decided that enoxaparin _30_mg SQ Q24Hrs is appropriate for this patient.  The pharmacy department will sign off at this time.  Please reconsult pharmacy if status changes or for further issues.  Thank you  Cyndia Diver PharmD, BCPS  11-04-2019, 6:11 AM

## 2019-10-19 NOTE — Progress Notes (Signed)
[facility] Progress Note  Chief Complaint: Choledocholithiasis  History:  Christina Ortiz is feeling well after her ERCP by Dr. Christella Hartigan yesterday.  She denies abdominal pain, nausea or vomiting.  No fevers documented, and her WBC has normalized.  She remains on Zosyn since admission. She is tolerating a regular diet this morning without difficulty. I also spoke with her daughter Christina Ortiz extensively about the ERCP findings and plan.  Case and plan were also reviewed with [facility] hospitalist. I also discussed the ERCP findings and plan with Dr. Christella Hartigan.  The ERCP was challenging due to an altered extrahepatic biliary anatomy with bilobed duodenal diverticulum, dilated and tortuous CBD.  Multiple small stones removed, a single larger stone could not be removed and therefore a plastic biliary stent was placed.  ROS: Cardiovascular: She denies chest pain Respiratory: Denies dyspnea Urinary: Denies dysuria Hard of hearing, but we were able to communicate effectively Objective:   Current Facility-Administered Medications:  .  albuterol (VENTOLIN HFA) 108 (90 Base) MCG/ACT inhaler 2 puff, 2 puff, Inhalation, Q4H PRN, Rachael Fee, MD, 2 puff at 10/16/19 1753 .  alum & mag hydroxide-simeth (MAALOX/MYLANTA) 200-200-20 MG/5ML suspension 30 mL, 30 mL, Oral, Q4H PRN, Burnadette Pop, MD, 30 mL at 10/18/19 2123 .  amLODipine (NORVASC) tablet 5 mg, 5 mg, Oral, Daily, Adhikari, Amrit, MD .  furosemide (LASIX) tablet 20 mg, 20 mg, Oral, Daily, Adhikari, Amrit, MD .  indomethacin (INDOCIN) 50 MG suppository 100 mg, 100 mg, Rectal, Once, Unk Lightning, PA .  influenza vaccine adjuvanted (FLUAD) injection 0.5 mL, 0.5 mL, Intramuscular, Tomorrow-1000, Rometta Emery, MD, Stopped at 10/17/19 1000 .  labetalol (NORMODYNE) injection 10 mg, 10 mg, Intravenous, Q2H PRN, Rachael Fee, MD, 10 mg at 10/18/19 2250 .  lisinopril (ZESTRIL) tablet 40 mg, 40 mg, Oral, Daily, Adhikari, Amrit, MD .  metoprolol tartrate  (LOPRESSOR) tablet 12.5 mg, 12.5 mg, Oral, BID, Adhikari, Amrit, MD .  morphine 2 MG/ML injection 2 mg, 2 mg, Intravenous, Q3H PRN, Rachael Fee, MD .  ondansetron (ZOFRAN) tablet 4 mg, 4 mg, Oral, Q6H PRN **OR** ondansetron (ZOFRAN) injection 4 mg, 4 mg, Intravenous, Q6H PRN, Rachael Fee, MD .  piperacillin-tazobactam (ZOSYN) IVPB 3.375 g, 3.375 g, Intravenous, Q8H, Rachael Fee, MD, Last Rate: 12.5 mL/hr at 10-31-19 0523, 3.375 g at Oct 31, 2019 0523  . piperacillin-tazobactam (ZOSYN)  IV 3.375 g (31-Oct-2019 0523)     Vital signs in last 24 hrs: Vitals:   10/31/19 0136 10-31-19 0509  BP: (!) 140/101 (!) 162/95  Pulse: 76 78  Resp: 20 20  Temp: 97.7 F (36.5 C) (!) 97.3 F (36.3 C)  SpO2: 98% 99%    Intake/Output Summary (Last 24 hours) at 10/31/2019 0852 Last data filed at 10-31-19 0600 Gross per 24 hour  Intake 2540.52 ml  Output 400 ml  Net 2140.52 ml     Physical Exam Well-appearing.  Sitting up in bed breakfast and watching the news  HEENT: sclera anicteric, oral mucosa without lesions  Neck: supple, no thyromegaly, JVD or lymphadenopathy  Cardiac: RRR without murmurs, S1S2 heard, no peripheral edema  Pulm: clear to auscultation bilaterally, normal RR and fair inspiratory effort noted  Abdomen: soft, no tenderness, with active bowel sounds. No guarding or palpable hepatosplenomegaly  Skin; warm and dry, no jaundice  Recent Labs:  CBC Latest Ref Rng & Units 10/18/2019 10/17/2019 10/16/2019  WBC 4.0 - 10.5 K/uL 9.9 16.7(H) 18.8(H)  Hemoglobin 12.0 - 15.0 g/dL 34.7 42.5 95.6  Hematocrit  36.0 - 46.0 % 37.5 35.4(L) 37.5  Platelets 150 - 400 K/uL 167 182 200    No results for input(s): INR in the last 168 hours. CMP Latest Ref Rng & Units Oct 22, 2019 10/18/2019 10/17/2019  Glucose 70 - 99 mg/dL 182(H) 128(H) -  BUN 8 - 23 mg/dL 31(H) 23 -  Creatinine 0.44 - 1.00 mg/dL 1.21(H) 0.71 -  Sodium 135 - 145 mmol/L 138 137 -  Potassium 3.5 - 5.1 mmol/L 3.8  2.9(L) 3.3(L)  Chloride 98 - 111 mmol/L 103 102 -  CO2 22 - 32 mmol/L 25 26 -  Calcium 8.9 - 10.3 mg/dL 8.1(L) 8.2(L) -  Total Protein 6.5 - 8.1 g/dL 5.9(L) 6.1(L) -  Total Bilirubin 0.3 - 1.2 mg/dL 2.0(H) 2.6(H) -  Alkaline Phos 38 - 126 U/L 255(H) 304(H) -  AST 15 - 41 U/L 31 45(H) -  ALT 0 - 44 U/L 33 42 -   No blood cultures were drawn on admission  Radiologic studies:   CLINICAL DATA:  Choledocholithiasis.   EXAM: ERCP   TECHNIQUE: Multiple spot images obtained with the fluoroscopic device and submitted for interpretation post-procedure.   COMPARISON:  CT of the abdomen and pelvis on 10/16/2019   FINDINGS: Imaging during ERCP with a C-arm demonstrates cannulation of the common bile duct with contrast injection demonstrating significant dilatation of the common bile duct with multiple filling defects identified. There appears to be a large calculus in the common hepatic duct just superior to the cystic duct stump post cholecystectomy. Balloon sweep maneuver was performed to remove calculi. A common bile duct biliary stent was placed.   IMPRESSION: Biliary dilatation and choledocholithiasis treated with balloon sweep maneuvers and placement of a common bile duct biliary stent.   These images were submitted for radiologic interpretation only. Please see the procedural report for the amount of contrast and the fluoroscopy time utilized.     Electronically Signed   By: Aletta Edouard M.D.   On: 10/18/2019 14:31  @ASSESSMENTPLANBEGIN @ Assessment:  Choledocholithiasis Leukocytosis on admission that has now resolved.  This raises concern for their possibly having been low-grade biliary infection.  She has had 48 hours of Zosyn. Atrial fibrillation on oral anticoagulation  Plan: She can be discharged home today. Resume oral anticoagulation tomorrow Regular diet 3 more days of Augmentin Dr. Ardis Hughs will discussed the case with Dr. Fuller Plan, the patient's primary GI  physician to formulate a plan for repeat outpatient ERCP for advanced techniques of complex stone removal.  This will most likely be in about 4 to 6 weeks.  The patient and her daughter will hear from our office next week.  Total time 30 minutes, over half spent in discussion with patient and her daughter.   Nelida Meuse III Office: [phone]

## 2019-10-19 NOTE — Discharge Summary (Signed)
Physician Discharge Summary    Christina Ortiz VOZ:[id number] DOB: 08-25-1929 DOA: 10/16/2019     PCP: Esaw Grandchild, NP     Admit date: 10/16/2019  Discharge date: Oct 31, 2019     Admitted From: Home  Disposition:  Home     Discharge Condition:Stable  CODE STATUS:FULL  Diet recommendation: Heart Healthy     Brief/Interim Summary:     Patient is 85 year old female with history of CKD stage III, osteoarthritis, paroxysmal A. fib on Xarelto, coronary disease, status post cholecystectomy, GERD, hypertension, hyperlipidemia who presented to the emergency department with complaints of generalized weakness, abdominal pain, nausea but no vomiting.  Work-up in the emergency department showed increased LFTs including bilirubin more than 6.  Imaging showed choledocholithiasis.  GI consulted.    She underwent ERCP with removal of few stones, placement of plastic biliary stent.  She needs to follow-up with GI as an outpatient in 6 weeks for removal of remaining stones.  She is hemodynamically stable for discharge home today.       Following problems were addressed during her hospitalization:     Choledocholithiasis:Started on IV antibiotic to cover for possible cholangitis.  Has leukocytosis.  Afebrile.  Elevated liver enzymes but improving.   CT imaging showed extensive choledocholithiasis, with borderline to mild duct dilatation after cholecystectomy.   She underwent ERCP with removal of few stones, placement of plastic biliary stent.  She needs to follow-up with GI as an outpatient in 6 weeks for removal of remaining stones.   Currently she denies any abdomen pain, nausea or vomiting.  Continue antibiotics for 3 more days.     Hypokalemia/hypomagnesemia:  Supplemented and corrected     A. fib: Currently rate is controlled.  Was on Xarelto at home     Hypertension: Hypertensive.  Will resume her home medicines.     Coronary artery disease: Stable.      Hyperlipidemia: On statin at home.     Depression/anxiety: Currently stable.     Discharge Diagnoses:   Principal Problem:    Choledocholithiasis  Active Problems:    Coronary artery disease due to lipid rich plaque    Essential hypertension    Hyperlipidemia    Chronic anticoagulation    Depression, recurrent (HCC)    PAF (paroxysmal atrial fibrillation) (HCC)    Coronary artery disease    Hypothyroidism    Elevated LFTs           Discharge Instructions          Discharge Instructions          Diet - low sodium heart healthy     Complete by: As directed           Discharge instructions     Complete by: As directed           1)Follow up with your PCP in a week.  2)Take prescribed medicines as instructed.  Start taking Xarelto from tomorrow.  3)You will be called by your gastroenterologist for the appointment .        Increase activity slowly     Complete by: As directed                      Allergies as of 31-Oct-2019                 Reactions         Tape   Other (See Comments)        Patient's  skin is thin and will TEAR AND BRUISE EASILY                    Medication List          STOP taking these medications      ciprofloxacin 250 MG tablet  Commonly known as: Cipro              TAKE these medications      acetaminophen 325 MG tablet  Commonly known as: TYLENOL  Take 650 mg by mouth every 6 (six) hours as needed for fever.       amLODipine 5 MG tablet  Commonly known as: NORVASC  Take 1 tablet (5 mg total) by mouth daily.       amoxicillin-clavulanate 875-125 MG tablet  Commonly known as: Augmentin  Take 1 tablet by mouth 2 (two) times daily for 3 days.       antiseptic oral rinse Liqd  15 mLs by Mouth Rinse route daily as needed for dry mouth.       calcitonin (salmon) 200 UNIT/ACT nasal spray  Commonly known as:  MIACALCIN/FORTICAL  Place 1 spray into alternate nostrils daily.       celecoxib 200 MG capsule  Commonly known as: CELEBREX  Take 200 mg by mouth 2 (two) times daily.       cholecalciferol 1000 units tablet  Commonly known as: VITAMIN D  Take 1,000 Units by mouth every evening.       folic acid 1 MG tablet  Commonly known as: FOLVITE  TAKE 1 TABLET BY MOUTH DAILY       furosemide 20 MG tablet  Commonly known as: LASIX  Take 1 tablet (20 mg total) by mouth daily.       ipratropium-albuterol 0.5-2.5 (3) MG/3ML Soln  Commonly known as: DUONEB  Take 3 mLs by nebulization every 6 (six) hours as needed (sob and wheezing).       levothyroxine 75 MCG tablet  Commonly known as: SYNTHROID  TAKE 1 TABLET BY MOUTH DAILY BEFORE BREAKFAST.  What changed: See the new instructions.       magnesium oxide 400 MG tablet  Commonly known as: MAG-OX  Take 400 mg by mouth daily.       metoprolol tartrate 25 MG tablet  Commonly known as: LOPRESSOR  Take 0.5 tablets (12.5 mg total) by mouth 2 (two) times daily.       multivitamin Tabs tablet  Take 1 tablet by mouth daily.       omeprazole 20 MG capsule  Commonly known as: PRILOSEC  TAKE 1 CAPSULE BY MOUTH DAILY AS NEEDED FOR HEARTBURN.  What changed: See the new instructions.       oxybutynin 10 MG 24 hr tablet  Commonly known as: DITROPAN-XL  Take 10 mg by mouth at bedtime.       PARoxetine 40 MG tablet  Commonly known as: PAXIL  Take 1 tablet (40 mg total) by mouth daily.       polyethylene glycol 17 g packet  Commonly known as: MIRALAX / GLYCOLAX  Take 17 g by mouth daily.       potassium chloride SA 20 MEQ tablet  Commonly known as: KLOR-CON  Take 1 tablet (20 mEq total) by mouth daily.       quinapril 40 MG tablet  Commonly known as: ACCUPRIL  TAKE 1 TABLET (40 MG TOTAL) BY MOUTH DAILY.       Rivaroxaban 15 MG Tabs  tablet  Commonly known as:  Xarelto  Take 1 tablet (15 mg total) by mouth daily with supper. Start from tomorrow(10/20/19)  What changed: See the new instructions.       simvastatin 5 MG tablet  Commonly known as: ZOCOR  TAKE 1 TABLET BY MOUTH EVERY EVENING.  What changed: when to take this       Systane Balance 0.6 % Soln  Generic drug: Propylene Glycol  Place 1 drop into both eyes daily.       tolterodine 4 MG 24 hr capsule  Commonly known as: DETROL LA  Take 1 capsule by mouth daily.       traMADol 50 MG tablet  Commonly known as: ULTRAM  Take 50 mg by mouth every 6 (six) hours as needed for moderate pain or severe pain.                    Follow-up Information            Danford, Jinny BlossomKaty D, NP. Schedule an appointment as soon as possible for a visit in 1 week(s).     Specialty: Family Medicine  Contact information:  [address]  [place] [address]  [phone]                                 Allergies    Allergen   Reactions    .   Tape   Other (See Comments)            Patient's skin is thin and will TEAR AND BRUISE EASILY          Consultations:  .GI         Procedures/Studies:  Imaging Results                                             .            Subjective:  Patient seen and examined the bedside this morning.  Hemodynamically stable for discharge.  Discharge Exam:       Vitals:        10/14/2019 0136   10/14/2019 0509    BP:   (!) 140/101   (!) 162/95    Pulse:   76   78    Resp:   20   20    Temp:   97.7 F (36.5 C)   (!) 97.3 F (36.3 C)    SpO2:   98%   99%              Vitals:        10/18/19 2139   10/18/19 2247   10/14/2019 0136   10/14/2019 0509    BP:   (!) 155/107   (!) 169/103   (!) 140/101   (!) 162/95    Pulse:   89   77   76   78    Resp:   20    18   20   20     Temp:   98.5 F (36.9 C)       97.7 F (36.5 C)   (!) 97.3 F (36.3 C)    TempSrc:   Oral       Oral   Axillary    SpO2:   99%  98%   99%    Weight:                    Height:                          General: Pt is alert, awake, not in acute distress  Cardiovascular: RRR, S1/S2 +, no rubs, no gallops  Respiratory: CTA bilaterally, no wheezing, no rhonchi  Abdominal: Soft, NT, ND, bowel sounds +  Extremities: no edema, no cyanosis            The results of significant diagnostics from this hospitalization (including imaging, microbiology, ancillary and laboratory) are listed below for reference.           Microbiology:          Recent Results (from the past 240 hour(s))    SARS CORONAVIRUS 2 (TAT 6-24 HRS) Nasopharyngeal Nasopharyngeal Swab     Status: None        Collection Time: 10/16/19  6:12 PM        Specimen: Nasopharyngeal Swab    Result   Value   Ref Range   Status        SARS Coronavirus 2   NEGATIVE   NEGATIVE   Final            Comment:   (NOTE)  SARS-CoV-2 target nucleic acids are NOT DETECTED.  The SARS-CoV-2 RNA is generally detectable in upper and lower  respiratory specimens during the acute phase of infection. Negative  results do not preclude SARS-CoV-2 infection, do not rule out  co-infections with other pathogens, and should not be used as the  sole basis for treatment or other patient management decisions.  Negative results must be combined with clinical observations,  patient history, and epidemiological information. The expected  result is Negative.  Fact Sheet for Patients:  HairSlick.nohttps://www.fda.gov/media/138098/download  Fact Sheet for Healthcare Providers:  quierodirigir.comhttps://www.fda.gov/media/138095/download  This test is not yet approved or cleared by the [place] FDA and   has been authorized for detection  and/or diagnosis of SARS-CoV-2 by  FDA under an Emergency Use Authorization (EUA). This EUA will remain   in effect (meaning this test can be used) for the duration of the  COVID-19 declaration under Section 56  4(b)(1) of the Act, 21 U.S.C.  section 360bbb-3(b)(1), unless the authorization is terminated or  revoked sooner.  Performed at [facility], [address] [place], [place]  [address]             Labs:  BNP (last 3 results)   Recent Labs (within last 365 days)       Basic Metabolic Panel:  Last Labs  Liver Function Tests:  Last Labs                                                                                                                                            Last Labs                                  Last Labs        CBC:  Last Labs                                                                                                                    Cardiac Enzymes:   Last Labs        BNP:   Last Labs        CBG:   Last Labs        D-Dimer   Recent Labs (last 2 labs)        Hgb A1c   Recent Labs (last 2 labs)        Lipid Profile   Recent Labs (last 2 labs)        Thyroid function studies    Recent Labs (last 2 labs)             Anemia work up   [employer] (last 2 labs)        Urinalysis  Labs (Brief)  Sepsis Labs   Last Labs        Microbiology          Recent Results (from the past 240 hour(s))    SARS CORONAVIRUS 2 (TAT 6-24 HRS) Nasopharyngeal Nasopharyngeal Swab     Status: None        Collection Time: 10/16/19  6:12 PM        Specimen: Nasopharyngeal Swab    Result   Value   Ref Range   Status        SARS Coronavirus 2   NEGATIVE   NEGATIVE   Final            Comment:   (NOTE)  SARS-CoV-2 target nucleic acids are NOT DETECTED.  The SARS-CoV-2 RNA is generally detectable in upper and lower  respiratory specimens during the acute phase of infection. Negative  results do not preclude SARS-CoV-2 infection, do not rule out  co-infections with other pathogens, and should not be used as the  sole basis for treatment or other patient management decisions.  Negative results must be combined with clinical observations,  patient history, and epidemiological information. The expected  result is Negative.  Fact Sheet for Patients:  HairSlick.no  Fact Sheet for Healthcare Providers:  quierodirigir.com  This test is not yet approved or cleared by the [place] FDA and   has been authorized for detection and/or diagnosis of SARS-CoV-2 by  FDA under an Emergency Use Authorization (EUA). This EUA will remain   in effect (meaning this test can be used) for the duration of the  COVID-19 declaration under Section 56  4(b)(1) of the Act, 21 U.S.C.  section 360bbb-3(b)(1), unless the authorization is terminated or  revoked sooner.  Performed at  [facility], [address], [place], [place]  [address]             Please note:  You were cared for by a hospitalist during your hospital stay. Once you are discharged, your primary care physician will handle any further medical issues. Please note that NO REFILLS for any discharge medications will be authorized once you are discharged, as it is imperative that you return to your primary care physician (or establish a relationship with a primary care physician if you do not have one) for your post hospital discharge needs so that they can reassess your need for medications and monitor your lab values.           Time coordinating discharge: 40 minutes     SIGNED:        Burnadette Pop, MD        [facility]  11/11/2019, 10:24 AM  Pager [phone]     If 7PM-7AM, please contact night-coverage  [email or web address]  Password [email or web address]

## 2019-10-19 NOTE — Care Management Important Message (Signed)
Important Message  Patient Details IM Letter given to Rhea Pink SW to present to the Patient Name: Christina Ortiz MRN: [id number] Date of Birth: 01-Jul-1929   Medicare Important Message Given:  Yes     Kerin Salen 10/29/19, 11:34 AM

## 2019-10-23 ENCOUNTER — Telehealth: Payer: Self-pay

## 2019-10-23 NOTE — Telephone Encounter (Signed)
Follow up arranged with daughter for 11/02/19 with Alonza Bogus, PA

## 2019-10-23 NOTE — Telephone Encounter (Signed)
-----   Message from Ladene Artist, MD sent at 11-10-19  9:52 AM EDT ----- Regarding: RE: hospital follow up Mallie Mussel, I, or an APP, will see her in the office soon to assess her and make plans. Thanks, Norberto Sorenson  ----- Message ----- From: Doran Stabler, MD Sent: Nov 10, 2019   9:16 AM EDT To: Milus Banister, MD, Ladene Artist, MD Subject: hospital follow up                             Norberto Sorenson,    This is a patient of yours on whom DJ did ERCP for stone removal.  Tough biliary anatomy, one stone remained, stent placed.     Will need discussion and plan for repeat outpatient ERCP in 4-6 weeks for stone removal.   On [place] for A fib  Patient/daughter updated.  - HD

## 2019-10-24 ENCOUNTER — Other Ambulatory Visit: Payer: Self-pay | Admitting: Adult Health

## 2019-10-25 ENCOUNTER — Ambulatory Visit: Payer: Medicare PPO | Admitting: Adult Health

## 2019-10-26 ENCOUNTER — Other Ambulatory Visit: Payer: Self-pay | Admitting: Adult Health

## 2019-10-28 NOTE — Death Summary Note (Deleted)
Physician Discharge Summary  Christina Ortiz YQM:[id number]:[id number] DOB: 12/31/1928 DOA: 10/16/2019  PCP: [name], [name], NP  Admit date: 10/16/2019 Discharge date: 2019-03-14  Admitted From: Home Disposition:  Home  Discharge Condition:Stable CODE STATUS:FULL Diet recommendation: Heart Healthy  Brief/Interim Summary:  Patient is 85 year old female with history of CKD stage III, osteoarthritis, paroxysmal A. fib on Xarelto, coronary disease, status post cholecystectomy, GERD, hypertension, hyperlipidemia who presented to the emergency department with complaints of generalized weakness, abdominal pain, nausea but no vomiting.  Work-up in the emergency department showed increased LFTs including bilirubin more than 6.  Imaging showed choledocholithiasis.  GI consulted.    She underwent ERCP with removal of few stones, placement of plastic biliary stent.  She needs to follow-up with GI as an outpatient in 6 weeks for removal of remaining stones.  She is hemodynamically stable for discharge home today.    Following problems were addressed during her hospitalization:  Choledocholithiasis:Started on IV antibiotic to cover for possible cholangitis.  Has leukocytosis.  Afebrile.  Elevated liver enzymes but improving.  CT imaging showed extensive choledocholithiasis, with borderline to mild duct dilatation after cholecystectomy.  She underwent ERCP with removal of few stones, placement of plastic biliary stent.  She needs to follow-up with GI as an outpatient in 6 weeks for removal of remaining stones.  Currently she denies any abdomen pain, nausea or vomiting. Continue antibiotics for 3 more days.  Hypokalemia/hypomagnesemia:  Supplemented and corrected  A. fib: Currently rate is controlled.  Was on Xarelto at home  Hypertension: Hypertensive.  Will resume her home medicines.  Coronary artery disease: Stable.  Hyperlipidemia: On statin at home.  Depression/anxiety: Currently  stable.  Discharge Diagnoses:  Principal Problem:   Choledocholithiasis Active Problems:   Coronary artery disease due to lipid rich plaque   Essential hypertension   Hyperlipidemia   Chronic anticoagulation   Depression, recurrent (HCC)   PAF (paroxysmal atrial fibrillation) (HCC)   Coronary artery disease   Hypothyroidism   Elevated LFTs    Discharge Instructions  Discharge Instructions    Diet - low sodium heart healthy   Complete by: As directed    Discharge instructions   Complete by: As directed    1)Follow up with your PCP in a week. 2)Take prescribed medicines as instructed.  Start taking Xarelto from tomorrow. 3)You will be called by your gastroenterologist for the appointment .   Increase activity slowly   Complete by: As directed      Allergies as of 2019-03-14      Reactions   Tape Other (See Comments)   Patient's skin is thin and will TEAR AND BRUISE EASILY      Medication List    STOP taking these medications   ciprofloxacin 250 MG tablet Commonly known as: Cipro     TAKE these medications   acetaminophen 325 MG tablet Commonly known as: TYLENOL Take 650 mg by mouth every 6 (six) hours as needed for fever.   amLODipine 5 MG tablet Commonly known as: NORVASC Take 1 tablet (5 mg total) by mouth daily.   amoxicillin-clavulanate 875-125 MG tablet Commonly known as: Augmentin Take 1 tablet by mouth 2 (two) times daily for 3 days.   antiseptic oral rinse Liqd 15 mLs by Mouth Rinse route daily as needed for dry mouth.   calcitonin (salmon) 200 UNIT/ACT nasal spray Commonly known as: MIACALCIN/FORTICAL Place 1 spray into alternate nostrils daily.   celecoxib 200 MG capsule Commonly known as: CELEBREX Take 200 mg by mouth  2 (two) times daily.   cholecalciferol 1000 units tablet Commonly known as: VITAMIN D Take 1,000 Units by mouth every evening.   folic acid 1 MG tablet Commonly known as: FOLVITE TAKE 1 TABLET BY MOUTH DAILY    furosemide 20 MG tablet Commonly known as: LASIX Take 1 tablet (20 mg total) by mouth daily.   ipratropium-albuterol 0.5-2.5 (3) MG/3ML Soln Commonly known as: DUONEB Take 3 mLs by nebulization every 6 (six) hours as needed (sob and wheezing).   levothyroxine 75 MCG tablet Commonly known as: SYNTHROID TAKE 1 TABLET BY MOUTH DAILY BEFORE BREAKFAST. What changed: See the new instructions.   magnesium oxide 400 MG tablet Commonly known as: MAG-OX Take 400 mg by mouth daily.   metoprolol tartrate 25 MG tablet Commonly known as: LOPRESSOR Take 0.5 tablets (12.5 mg total) by mouth 2 (two) times daily.   multivitamin Tabs tablet Take 1 tablet by mouth daily.   omeprazole 20 MG capsule Commonly known as: PRILOSEC TAKE 1 CAPSULE BY MOUTH DAILY AS NEEDED FOR HEARTBURN. What changed: See the new instructions.   oxybutynin 10 MG 24 hr tablet Commonly known as: DITROPAN-XL Take 10 mg by mouth at bedtime.   PARoxetine 40 MG tablet Commonly known as: PAXIL Take 1 tablet (40 mg total) by mouth daily.   polyethylene glycol 17 g packet Commonly known as: MIRALAX / GLYCOLAX Take 17 g by mouth daily.   potassium chloride SA 20 MEQ tablet Commonly known as: KLOR-CON Take 1 tablet (20 mEq total) by mouth daily.   quinapril 40 MG tablet Commonly known as: ACCUPRIL TAKE 1 TABLET (40 MG TOTAL) BY MOUTH DAILY.   Rivaroxaban 15 MG Tabs tablet Commonly known as: Xarelto Take 1 tablet (15 mg total) by mouth daily with supper. Start from tomorrow(10/20/19) What changed: See the new instructions.   simvastatin 5 MG tablet Commonly known as: ZOCOR TAKE 1 TABLET BY MOUTH EVERY EVENING. What changed: when to take this   Systane Balance 0.6 % Soln Generic drug: Propylene Glycol Place 1 drop into both eyes daily.   tolterodine 4 MG 24 hr capsule Commonly known as: DETROL LA Take 1 capsule by mouth daily.   traMADol 50 MG tablet Commonly known as: ULTRAM Take 50 mg by mouth every 6  (six) hours as needed for moderate pain or severe pain.      Follow-up Information    Danford, Berna Spare, NP. Schedule an appointment as soon as possible for a visit in 1 week(s).   Specialty: Family Medicine Contact information: [address] [place] [address] [phone]          Allergies  Allergen Reactions  . Tape Other (See Comments)    Patient's skin is thin and will TEAR AND BRUISE EASILY    Consultations:  GI   Procedures/Studies: Ct Chest W Contrast  Result Date: 10/16/2019 CLINICAL DATA:  Four days weakness. Loss of appetite. Possible jaundice. History urinary tract infections. EXAM: CT CHEST, ABDOMEN, AND PELVIS WITH CONTRAST TECHNIQUE: Multidetector CT imaging of the chest, abdomen and pelvis was performed following the standard protocol during bolus administration of intravenous contrast. CONTRAST:  147mL OMNIPAQUE IOHEXOL 300 MG/ML  SOLN COMPARISON:  Chest radiograph 10/16/2019.  Pelvic CT 06/03/2018. FINDINGS: CT CHEST FINDINGS Cardiovascular: Advanced aortic and branch vessel atherosclerosis. Tortuous thoracic aorta. Moderate cardiomegaly with trace pericardial fluid, likely physiologic. Multivessel coronary artery atherosclerosis. No central pulmonary embolism, on this non-dedicated study. Mediastinum/Nodes: No mediastinal or hilar adenopathy. Large hiatal hernia, with greater  than 2/3 of the stomach positioned within the chest. Lungs/Pleura: Small right pleural effusion. Trace left pleural thickening. Mild motion and is EKG lead artifact degradation. Moderate centrilobular emphysema. Bibasilar atelectasis. Probable calcified granuloma in the left lower lobe on 69/4. Scattered bilateral pulmonary nodules. Left upper lobe pulmonary nodule of 4 mm on 51/3. A likely calcified right upper lobe pulmonary nodule of 4 mm on 55/4. 4 mm nodule along the right minor fissure is positioned within the right middle lobe and may also be calcified. Musculoskeletal: No acute  osseous abnormality. Remote right lower rib fractures. Advanced thoracolumbar spondylosis CT ABDOMEN PELVIS FINDINGS Hepatobiliary: Mild technique limitations secondary to motion and EKG leads continues into the upper abdomen. No focal liver lesion. Cholecystectomy. Mild intrahepatic duct dilatation. The common duct is borderline dilated for prior cholecystectomy state, 11 mm. There are noncalcified common duct stones including at 11 mm on 66/2 and 8 mm on 67/2. Also coronal images 44 and 47. There is also likely an 8 mm stone just above the ampulla on 71/2. Pancreas: Moderate pancreatic atrophy with multiple cystic foci including at up to 9 mm on 59/2. No acute inflammation. Spleen: Normal in size, without focal abnormality. Adrenals/Urinary Tract: Normal adrenal glands. Mild bilateral renal scarring. Lower pole left renal cyst. Bilateral too small to characterize renal lesions. No hydronephrosis. Normal urinary bladder. Stomach/Bowel: Normal distal stomach. Scattered colonic diverticula. Normal terminal ileum. Normal small bowel. Vascular/Lymphatic: Advanced aortic and branch vessel atherosclerosis. No abdominopelvic adenopathy. Reproductive: Normal uterus and adnexa.  Tubal ligation. Other: No significant free fluid.  Mild pelvic floor laxity. Musculoskeletal: No acute osseous abnormality. IMPRESSION: 1. Extensive choledocholithiasis, with borderline to mild duct dilatation after cholecystectomy. Recommend ERCP. 2. Motion and artifact degradation, as detailed above. 3. Bilateral pulmonary nodules, primarily felt to represent calcified granulomas. Given patient age, these are of questionable clinical significance. 4. Coronary artery atherosclerosis. Aortic Atherosclerosis (ICD10-I70.0). 5. Large hiatal hernia. 6. Small right pleural effusion. 7. Pancreatic atrophy with multiple cystic foci within, most likely related to side branch ductectasia or pseudocyst. Electronically Signed   By: Jeronimo Greaves M.D.   On:  10/16/2019 20:01   Ct Abdomen Pelvis W Contrast  Result Date: 10/16/2019 CLINICAL DATA:  Four days weakness. Loss of appetite. Possible jaundice. History urinary tract infections. EXAM: CT CHEST, ABDOMEN, AND PELVIS WITH CONTRAST TECHNIQUE: Multidetector CT imaging of the chest, abdomen and pelvis was performed following the standard protocol during bolus administration of intravenous contrast. CONTRAST:  OMNIPAQUE IOHEXOL 300 MG/ML  SOLN COMPARISON:  Chest radiograph 10/16/2019.  Pelvic CT 06/03/2018. FINDINGS: CT CHEST FINDINGS Cardiovascular: Advanced aortic and branch vessel atherosclerosis. Tortuous thoracic aorta. Moderate cardiomegaly with trace pericardial fluid, likely physiologic. Multivessel coronary artery atherosclerosis. No central pulmonary embolism, on this non-dedicated study. Mediastinum/Nodes: No mediastinal or hilar adenopathy. Large hiatal hernia, with greater than 2/3 of the stomach positioned within the chest. Lungs/Pleura: Small right pleural effusion. Trace left pleural thickening. Mild motion and is EKG lead artifact degradation. Moderate centrilobular emphysema. Bibasilar atelectasis. Probable calcified granuloma in the left lower lobe on 69/4. Scattered bilateral pulmonary nodules. Left upper lobe pulmonary nodule of 4 mm on 51/3. A likely calcified right upper lobe pulmonary nodule of 4 mm on 55/4. 4 mm nodule along the right minor fissure is positioned within the right middle lobe and may also be calcified. Musculoskeletal: No acute osseous abnormality. Remote right lower rib fractures. Advanced thoracolumbar spondylosis CT ABDOMEN PELVIS FINDINGS Hepatobiliary: Mild technique limitations secondary to motion and EKG leads  continues into the upper abdomen. No focal liver lesion. Cholecystectomy. Mild intrahepatic duct dilatation. The common duct is borderline dilated for prior cholecystectomy state, 11 mm. There are noncalcified common duct stones including at 11 mm on 66/2  and 8 mm on 67/2. Also coronal images 44 and 47. There is also likely an 8 mm stone just above the ampulla on 71/2. Pancreas: Moderate pancreatic atrophy with multiple cystic foci including at up to 9 mm on 59/2. No acute inflammation. Spleen: Normal in size, without focal abnormality. Adrenals/Urinary Tract: Normal adrenal glands. Mild bilateral renal scarring. Lower pole left renal cyst. Bilateral too small to characterize renal lesions. No hydronephrosis. Normal urinary bladder. Stomach/Bowel: Normal distal stomach. Scattered colonic diverticula. Normal terminal ileum. Normal small bowel. Vascular/Lymphatic: Advanced aortic and branch vessel atherosclerosis. No abdominopelvic adenopathy. Reproductive: Normal uterus and adnexa.  Tubal ligation. Other: No significant free fluid.  Mild pelvic floor laxity. Musculoskeletal: No acute osseous abnormality. IMPRESSION: 1. Extensive choledocholithiasis, with borderline to mild duct dilatation after cholecystectomy. Recommend ERCP. 2. Motion and artifact degradation, as detailed above. 3. Bilateral pulmonary nodules, primarily felt to represent calcified granulomas. Given patient age, these are of questionable clinical significance. 4. Coronary artery atherosclerosis. Aortic Atherosclerosis (ICD10-I70.0). 5. Large hiatal hernia. 6. Small right pleural effusion. 7. Pancreatic atrophy with multiple cystic foci within, most likely related to side branch ductectasia or pseudocyst. Electronically Signed   By: Jeronimo Greaves M.D.   On: 10/16/2019 20:01   Dg Chest Port 1 View  Result Date: 10/16/2019 CLINICAL DATA:  Shortness of breath. EXAM: PORTABLE CHEST 1 VIEW COMPARISON:  06/06/2018 FINDINGS: There is chronic marked cardiomegaly. Pulmonary vascularity is normal. Aortic atherosclerosis. Double density at the right heart border may represent a pericardial cyst but appears unchanged. The possibility of a large hiatal hernia should also be considered. There is slight  atelectasis at the lung bases which is new since the prior study. No acute bone abnormality. IMPRESSION: 1. New slight atelectasis at the lung bases. 2. Chronic marked cardiomegaly. 3. Possible pericardial cyst at the right heart border. 4. Aortic atherosclerosis. Electronically Signed   By: Francene Boyers M.D.   On: 10/16/2019 18:22   Dg Ercp Biliary & Pancreatic Ducts  Result Date: 10/18/2019 CLINICAL DATA:  Choledocholithiasis. EXAM: ERCP TECHNIQUE: Multiple spot images obtained with the fluoroscopic device and submitted for interpretation post-procedure. COMPARISON:  CT of the abdomen and pelvis on 10/16/2019 FINDINGS: Imaging during ERCP with a C-arm demonstrates cannulation of the common bile duct with contrast injection demonstrating significant dilatation of the common bile duct with multiple filling defects identified. There appears to be a large calculus in the common hepatic duct just superior to the cystic duct stump post cholecystectomy. Balloon sweep maneuver was performed to remove calculi. A common bile duct biliary stent was placed. IMPRESSION: Biliary dilatation and choledocholithiasis treated with balloon sweep maneuvers and placement of a common bile duct biliary stent. These images were submitted for radiologic interpretation only. Please see the procedural report for the amount of contrast and the fluoroscopy time utilized. Electronically Signed   By: Irish Lack M.D.   On: 10/18/2019 14:31       Subjective: Patient seen and examined the bedside this morning.  Hemodynamically stable for discharge. Discharge Exam: Vitals:   Nov 08, 2019 0136 Nov 08, 2019 0509  BP: (!) 140/101 (!) 162/95  Pulse: 76 78  Resp: 20 20  Temp: 97.7 F (36.5 C) (!) 97.3 F (36.3 C)  SpO2: 98% 99%   Vitals:  10/18/19 2139 10/18/19 2247 10-28-19 0136 10-28-19 0509  BP: (!) 155/107 (!) 169/103 (!) 140/101 (!) 162/95  Pulse: 89 77 76 78  Resp: 20 18 20 20   Temp: 98.5 F (36.9 C)  97.7 F (36.5 C)  (!) 97.3 F (36.3 C)  TempSrc: Oral  Oral Axillary  SpO2: 99%  98% 99%  Weight:      Height:        General: Pt is alert, awake, not in acute distress Cardiovascular: RRR, S1/S2 +, no rubs, no gallops Respiratory: CTA bilaterally, no wheezing, no rhonchi Abdominal: Soft, NT, ND, bowel sounds + Extremities: no edema, no cyanosis    The results of significant diagnostics from this hospitalization (including imaging, microbiology, ancillary and laboratory) are listed below for reference.     Microbiology: Recent Results (from the past 240 hour(s))  SARS CORONAVIRUS 2 (TAT 6-24 HRS) Nasopharyngeal Nasopharyngeal Swab     Status: None   Collection Time: 10/16/19  6:12 PM   Specimen: Nasopharyngeal Swab  Result Value Ref Range Status   SARS Coronavirus 2 NEGATIVE NEGATIVE Final    Comment: (NOTE) SARS-CoV-2 target nucleic acids are NOT DETECTED. The SARS-CoV-2 RNA is generally detectable in upper and lower respiratory specimens during the acute phase of infection. Negative results do not preclude SARS-CoV-2 infection, do not rule out co-infections with other pathogens, and should not be used as the sole basis for treatment or other patient management decisions. Negative results must be combined with clinical observations, patient history, and epidemiological information. The expected result is Negative. Fact Sheet for Patients: HairSlick.nohttps://www.fda.gov/media/138098/download Fact Sheet for Healthcare Providers: quierodirigir.comhttps://www.fda.gov/media/138095/download This test is not yet approved or cleared by the [place] FDA and  has been authorized for detection and/or diagnosis of SARS-CoV-2 by FDA under an Emergency Use Authorization (EUA). This EUA will remain  in effect (meaning this test can be used) for the duration of the COVID-19 declaration under Section 56 4(b)(1) of the Act, 21 U.S.C. section 360bbb-3(b)(1), unless the authorization is terminated or revoked sooner. Performed  at [facility], [address] [place], [place] [address]      Labs: BNP (last 3 results) No results for input(s): BNP in the last 8760 hours. Basic Metabolic Panel: Recent Labs  Lab 10/16/19 1819 10/17/19 0558 10/17/19 1441 10/18/19 0249 10-28-19 0232  NA 136 137  --  137 138  K 4.8 2.6* 3.3* 2.9* 3.8  CL 95* 97*  --  102 103  CO2 26 26  --  26 25  GLUCOSE 104* 88  --  128* 182*  BUN 36* 34*  --  23 31*  CREATININE 1.04* 0.90  --  0.71 1.21*  CALCIUM 8.6* 8.5*  --  8.2* 8.1*  MG  --  1.5*  --  2.0  --    Liver Function Tests: Recent Labs  Lab 10/16/19 1819 10/17/19 0558 10/18/19 0249 10-28-19 0232  AST 139* 74* 45* 31  ALT 76* 56* 42 33  ALKPHOS 377* 348* 304* 255*  BILITOT 6.4* 4.7* 2.6* 2.0*  PROT 6.6 6.4* 6.1* 5.9*  ALBUMIN 3.3* 3.0* 2.6* 2.4*   Recent Labs  Lab 10/16/19 1819  LIPASE 17   No results for input(s): AMMONIA in the last 168 hours. CBC: Recent Labs  Lab 10/16/19 1819 10/17/19 0558 10/18/19 0249  WBC 18.8* 16.7* 9.9  NEUTROABS 16.8*  --  8.6*  HGB 12.6 12.1 12.3  HCT 37.5 35.4* 37.5  MCV 101.1* 99.7 101.1*  PLT 200 182  167   Cardiac Enzymes: No results for input(s): CKTOTAL, CKMB, CKMBINDEX, TROPONINI in the last 168 hours. BNP: Invalid input(s): POCBNP CBG: No results for input(s): GLUCAP in the last 168 hours. D-Dimer No results for input(s): DDIMER in the last 72 hours. Hgb A1c No results for input(s): HGBA1C in the last 72 hours. Lipid Profile No results for input(s): CHOL, HDL, LDLCALC, TRIG, CHOLHDL, LDLDIRECT in the last 72 hours. Thyroid function studies No results for input(s): TSH, T4TOTAL, T3FREE, THYROIDAB in the last 72 hours.  Invalid input(s): FREET3 Anemia work up No results for input(s): VITAMINB12, FOLATE, FERRITIN, TIBC, IRON, RETICCTPCT in the last 72 hours. Urinalysis    Component Value Date/Time   COLORURINE YELLOW 10/17/2019 1326   APPEARANCEUR CLEAR 10/17/2019 1326   LABSPEC 1.010  10/17/2019 1326   PHURINE 5.0 10/17/2019 1326   GLUCOSEU NEGATIVE 10/17/2019 1326   HGBUR NEGATIVE 10/17/2019 1326   BILIRUBINUR MODERATE (A) 10/17/2019 1326   BILIRUBINUR negative 09/27/2019 1041   KETONESUR NEGATIVE 10/17/2019 1326   PROTEINUR 100 (A) 10/17/2019 1326   UROBILINOGEN 0.2 09/27/2019 1041   UROBILINOGEN 1.0 04/12/2017 1157   NITRITE NEGATIVE 10/17/2019 1326   LEUKOCYTESUR NEGATIVE 10/17/2019 1326   Sepsis Labs Invalid input(s): PROCALCITONIN,  WBC,  LACTICIDVEN Microbiology Recent Results (from the past 240 hour(s))  SARS CORONAVIRUS 2 (TAT 6-24 HRS) Nasopharyngeal Nasopharyngeal Swab     Status: None   Collection Time: 10/16/19  6:12 PM   Specimen: Nasopharyngeal Swab  Result Value Ref Range Status   SARS Coronavirus 2 NEGATIVE NEGATIVE Final    Comment: (NOTE) SARS-CoV-2 target nucleic acids are NOT DETECTED. The SARS-CoV-2 RNA is generally detectable in upper and lower respiratory specimens during the acute phase of infection. Negative results do not preclude SARS-CoV-2 infection, do not rule out co-infections with other pathogens, and should not be used as the sole basis for treatment or other patient management decisions. Negative results must be combined with clinical observations, patient history, and epidemiological information. The expected result is Negative. Fact Sheet for Patients: HairSlick.no Fact Sheet for Healthcare Providers: quierodirigir.com This test is not yet approved or cleared by the [place] FDA and  has been authorized for detection and/or diagnosis of SARS-CoV-2 by FDA under an Emergency Use Authorization (EUA). This EUA will remain  in effect (meaning this test can be used) for the duration of the COVID-19 declaration under Section 56 4(b)(1) of the Act, 21 U.S.C. section 360bbb-3(b)(1), unless the authorization is terminated or revoked sooner. Performed at [facility], [address] [place], [place] [address]     Please note: You were cared for by a hospitalist during your hospital stay. Once you are discharged, your primary care physician will handle any further medical issues. Please note that NO REFILLS for any discharge medications will be authorized once you are discharged, as it is imperative that you return to your primary care physician (or establish a relationship with a primary care physician if you do not have one) for your post hospital discharge needs so that they can reassess your need for medications and monitor your lab values.    Time coordinating discharge: 40 minutes  SIGNED:   Burnadette Pop, MD  [facility] 21-Oct-2019, 10:24 AM Pager [phone]  If 7PM-7AM, please contact night-coverage [email or web address] Password [email or web address]

## 2019-10-28 DEATH — deceased

## 2019-10-30 ENCOUNTER — Other Ambulatory Visit: Payer: Self-pay | Admitting: Cardiology

## 2019-10-30 NOTE — Telephone Encounter (Signed)
Prescription refill request for Xarelto received.   Last office visit: [place] (08-14-2019) Weight:  66.4 kg Age: 85 yo Scr: 1.21 (11-11-19) CrCl: 34 ml/min  Prescription refill sent

## 2019-11-01 ENCOUNTER — Encounter: Payer: Self-pay | Admitting: Adult Health

## 2019-11-01 ENCOUNTER — Other Ambulatory Visit: Payer: Self-pay

## 2019-11-01 ENCOUNTER — Ambulatory Visit (INDEPENDENT_AMBULATORY_CARE_PROVIDER_SITE_OTHER): Payer: Medicare PPO | Admitting: Adult Health

## 2019-11-01 VITALS — BP 132/80 | HR 73 | Temp 99.2°F | Ht 60.75 in | Wt 150.8 lb

## 2019-11-01 DIAGNOSIS — Z09 Encounter for follow-up examination after completed treatment for conditions other than malignant neoplasm: Secondary | ICD-10-CM | POA: Diagnosis not present

## 2019-11-01 DIAGNOSIS — Z23 Encounter for immunization: Secondary | ICD-10-CM | POA: Diagnosis not present

## 2019-11-01 DIAGNOSIS — R29898 Other symptoms and signs involving the musculoskeletal system: Secondary | ICD-10-CM

## 2019-11-01 DIAGNOSIS — I10 Essential (primary) hypertension: Secondary | ICD-10-CM | POA: Diagnosis not present

## 2019-11-01 DIAGNOSIS — Z7901 Long term (current) use of anticoagulants: Secondary | ICD-10-CM | POA: Diagnosis not present

## 2019-11-01 DIAGNOSIS — R5381 Other malaise: Secondary | ICD-10-CM

## 2019-11-01 NOTE — Assessment & Plan Note (Signed)
She has resumed Xarelto- pt and daughter confirmed.

## 2019-11-01 NOTE — Progress Notes (Signed)
Subjective:    Patient ID: Christina Ortiz, female    DOB: [birth year], 85 y.o.   MRN: [id number]  HPI:  Ms. Mcburney is here for hospital f/u Admitted 10/16/2019-11-15-19 "Patient is 85 year old female with history of CKD stage III, osteoarthritis, paroxysmal A. fib on Xarelto, coronary disease, status post cholecystectomy, GERD, hypertension, hyperlipidemia who presented to the emergency department with complaints of generalized weakness, abdominal pain, nausea but no vomiting.  Work-up in the emergency department showed increased LFTs including bilirubin more than 6.  Imaging showed choledocholithiasis.  GI consulted.    She underwent ERCP with removal of few stones, placement of plastic biliary stent.  She needs to follow-up with GI as an outpatient in 6 weeks for removal of remaining stones.  She is hemodynamically stable for discharge home today".   She has resumed Xarelto- pt and daughter confirmed.  She has appt with [facility] tomorrow. She denies abdominal pain. She denies N/V/D/C. She denies urinary sx's. She denies hematuria/hematochezia. She reports appetite returning- wt increased 4 lbs since last OV 5 weeks ago. She denies acute cardiac sx's. She reports increase in fatigue/weakness- agreeable to home PT referral.    Patient Care Team    Relationship Specialty Notifications Start End  William Hamburger D, NP PCP - General Family Medicine  03/30/17   Jake Bathe, MD PCP - Cardiology Cardiology  08/14/19   Mateo Flow, MD Consulting Physician Ophthalmology  03/30/17   [facility]    03/30/17   Chilton Greathouse, MD Consulting Physician Pulmonary Disease  04/27/18     Patient Active Problem List   Diagnosis Date Noted  . Muscular deconditioning 11/01/2019  . Elevated LFTs   . Choledocholithiasis 10/16/2019  . Abnormal urinalysis 09/28/2019  . Adult failure to thrive 09/28/2019  . Dysphagia 09/06/2018  . Pain   . Closed displaced fracture of  greater trochanter of left femur (HCC) 06/04/2018  . Hypoxia 06/03/2018  . Hospital discharge follow-up 04/27/2018  . Fall 03/31/2018  . Humerus shaft fracture-right 03/31/2018  . Rhabdomyolysis 03/31/2018  . Hypothermia 03/31/2018  . Healthcare maintenance 01/17/2018  . Nocturia 01/17/2018  . Macrocytosis 11/22/2017  . Acute bronchitis due to respiratory syncytial virus (RSV) 11/09/2017  . Acute respiratory failure with hypoxia (HCC) 11/09/2017  . Hypertension 11/09/2017  . PAF (paroxysmal atrial fibrillation) (HCC) 11/09/2017  . Coronary artery disease 11/09/2017  . CKD (chronic kidney disease) stage 2, GFR 60-89 ml/min 11/09/2017  . Hypothyroidism 11/09/2017  . Dyspnea 11/08/2017  . Upper respiratory tract infection 11/08/2017  . Elevated serum creatinine 06/21/2017  . Edema of both ankles 06/21/2017  . Diarrhea 05/25/2017  . Hypokalemia 05/25/2017  . Alcohol abuse 05/25/2017  . Depression, recurrent (HCC) 03/30/2017  . Lumbago 03/30/2017  . Influenza with respiratory manifestation 01/22/2017  . Coronary artery disease due to lipid rich plaque 04/04/2015  . Essential hypertension 04/04/2015  . Hyperlipidemia 04/04/2015  . Former smoker 04/04/2015  . Chronic anticoagulation 04/04/2015     Past Medical History:  Diagnosis Date  . Acute bronchitis 10/2017  . Arthritis   . CKD (chronic kidney disease)    Xarelto dose is 15 mg QD  . Coronary artery disease    a. s/p PCI/stenting to RCA and LAD ([facility] in [place]) in 2006; b. Nuc 1/15: No ischemia, EF 70%  . Depression   . Gallstones   . Gastritis   . Hiatal hernia   . Hip fracture (HCC)   . History of cholecystectomy   .  History of echocardiogram    a. Echo 1/15: Moderate LVH, EF 55-60%, impaired relaxation, mild AS  . Hyperlipidemia   . Hypertension   . Hypokalemia   . Mobitz type 1 second degree atrioventricular block    a. Event Monitor 3/15: NSR, first-degree AV block, second-degree AB block - Mobitz 1,  PACs, NSVT (5 beats)  . PAF (paroxysmal atrial fibrillation) ([place])   . Thyroid disease      Past Surgical History:  Procedure Laterality Date  . ANGIOPLASTY     with sent placement  . BILIARY DILATION  10/18/2019   Procedure: BILIARY DILATION;  Surgeon: Milus Banister, MD;  Location: [facility];  Service: Endoscopy;;  . BILIARY STENT PLACEMENT N/A 10/18/2019   Procedure: BILIARY STENT PLACEMENT;  Surgeon: Milus Banister, MD;  Location: [facility];  Service: Endoscopy;  Laterality: N/A;  . BLEPHAROPLASTY    . BREAST LUMPECTOMY Left   . CARDIAC CATHETERIZATION    . CATARACT EXTRACTION Bilateral   . CHOLECYSTECTOMY    . DILATION AND CURETTAGE OF UTERUS    . ERCP N/A 10/18/2019   Procedure: ENDOSCOPIC RETROGRADE CHOLANGIOPANCREATOGRAPHY (ERCP);  Surgeon: Milus Banister, MD;  Location: [facility];  Service: Endoscopy;  Laterality: N/A;  . REMOVAL OF STONES  10/18/2019   Procedure: REMOVAL OF STONES;  Surgeon: Milus Banister, MD;  Location: [facility];  Service: Endoscopy;;  . Joan Mayans  10/18/2019   Procedure: Joan Mayans;  Surgeon: Milus Banister, MD;  Location: [facility];  Service: Endoscopy;;     Family History  Problem Relation Age of Onset  . Heart disease Mother   . Heart attack Mother   . Lung cancer Father   . Lymphoma Brother   . Heart failure Brother   . Esophageal cancer Paternal Uncle   . Colon cancer Neg Hx   . Colon polyps Neg Hx   . Rectal cancer Neg Hx   . Stomach cancer Neg Hx      Social History   Substance and Sexual Activity  Drug Use No     Social History   Substance and Sexual Activity  Alcohol Use Yes  . Alcohol/week: 10.0 standard drinks  . Types: 10 Shots of liquor per week   Comment: 7 a week     Social History   Tobacco Use  Smoking Status Former Smoker  . Packs/day: 0.50  . Years: 67.00  . Pack years: 33.50  . Types: Cigarettes  . Quit date: 10/24/2014  . Years since quitting: 5.0  Smokeless  Tobacco Never Used  Tobacco Comment   quit 2015     Outpatient Encounter Medications as of 11/01/2019  Medication Sig Note  . acetaminophen (TYLENOL) 325 MG tablet Take 650 mg by mouth every 6 (six) hours as needed for fever.    Marland Kitchen amLODipine (NORVASC) 5 MG tablet Take 1 tablet (5 mg total) by mouth daily. 10/17/2019: Filled 90 day supply 10/17/2019  . antiseptic oral rinse (BIOTENE) LIQD 15 mLs by Mouth Rinse route daily as needed for dry mouth.    . calcitonin, salmon, (MIACALCIN/FORTICAL) 200 UNIT/ACT nasal spray Place 1 spray into alternate nostrils daily.  10/17/2019: Filled 09/17/2019  . celecoxib (CELEBREX) 200 MG capsule Take 200 mg by mouth 2 (two) times daily.    . cholecalciferol (VITAMIN D) 1000 units tablet Take 1,000 Units by mouth every evening.   . folic acid (FOLVITE) 1 MG tablet TAKE 1 TABLET BY MOUTH DAILY (Patient taking differently: Take 1 mg by mouth  daily. ) 10/17/2019: 90 day supply filled 09/04/2019  . furosemide (LASIX) 20 MG tablet Take 1 tablet (20 mg total) by mouth daily. 10/17/2019: Filled 30 day supply 09/28/2019  . ipratropium-albuterol (DUONEB) 0.5-2.5 (3) MG/3ML SOLN Take 3 mLs by nebulization every 6 (six) hours as needed (sob and wheezing).    Marland Kitchen. levothyroxine (SYNTHROID) 75 MCG tablet TAKE 1 TABLET BY MOUTH DAILY BEFORE BREAKFAST. (Patient taking differently: Take 75 mcg by mouth daily before breakfast. ) 10/17/2019: Filled 90 day supply 10/10/2019  . magnesium oxide (MAG-OX) 400 MG tablet Take 400 mg by mouth daily.   . metoprolol tartrate (LOPRESSOR) 25 MG tablet TAKE 1/2 TABLET BY MOUTH TWICE A DAY.   . multivitamin (PROSIGHT) TABS tablet Take 1 tablet by mouth daily.   Marland Kitchen. omeprazole (PRILOSEC) 20 MG capsule TAKE 1 CAPSULE BY MOUTH DAILY AS NEEDED FOR HEARTBURN. (Patient taking differently: Take 20 mg by mouth daily as needed (heartburn). ) 10/17/2019: 90 day supply filled 09/04/2019  . oxybutynin (DITROPAN-XL) 10 MG 24 hr tablet Take 10 mg by mouth at  bedtime.  10/17/2019: 30 day supply 08/29/2019  . PARoxetine (PAXIL) 40 MG tablet Take 1 tablet (40 mg total) by mouth daily. 10/17/2019: Filled 90 day supply 07/26/2019  . polyethylene glycol (MIRALAX / GLYCOLAX) packet Take 17 g by mouth daily.   . potassium chloride SA (K-DUR) 20 MEQ tablet Take 1 tablet (20 mEq total) by mouth daily. 10/17/2019: Filled 90 day supply 08/10/2019  . Propylene Glycol (SYSTANE BALANCE) 0.6 % SOLN Place 1 drop into both eyes daily.   . quinapril (ACCUPRIL) 40 MG tablet TAKE 1 TABLET (40 MG TOTAL) BY MOUTH DAILY.   . simvastatin (ZOCOR) 5 MG tablet TAKE 1 TABLET BY MOUTH EVERY EVENING. (Patient taking differently: Take 5 mg by mouth at bedtime. ) 10/17/2019: 90 day supply filled 10/10/2019  . tolterodine (DETROL LA) 4 MG 24 hr capsule Take 1 capsule by mouth daily.   . traMADol (ULTRAM) 50 MG tablet Take 50 mg by mouth every 6 (six) hours as needed for moderate pain or severe pain.    Marland Kitchen. XARELTO 15 MG TABS tablet TAKE 1 TABLET (15 MG TOTAL) BY MOUTH DAILY WITH SUPPER.    No facility-administered encounter medications on file as of 11/01/2019.     Allergies: Tape  Body mass index is 28.73 kg/m.  Blood pressure 132/80, pulse 73, temperature 99.2 F (37.3 C), temperature source Oral, height 5' 0.75" (1.543 m), weight 150 lb 12.8 oz (68.4 kg), SpO2 95 %.  Review of Systems  Constitutional: Positive for fatigue. Negative for activity change, appetite change, chills, diaphoresis, fever and unexpected weight change.  HENT: Negative for hearing loss.   Eyes: Negative for visual disturbance.  Respiratory: Negative for cough, chest tightness, shortness of breath, wheezing and stridor.   Musculoskeletal: Positive for arthralgias and myalgias.  Neurological: Negative for dizziness and headaches.  Hematological: Negative for adenopathy. Does not bruise/bleed easily.  Psychiatric/Behavioral: Negative for agitation, behavioral problems, confusion, decreased concentration,  dysphoric mood, hallucinations, self-injury, sleep disturbance and suicidal ideas. The patient is not nervous/anxious and is not hyperactive.        Objective:   Physical Exam Constitutional:      General: She is not in acute distress.    Appearance: Normal appearance. She is normal weight. She is not ill-appearing, toxic-appearing or diaphoretic.  HENT:     Head: Normocephalic and atraumatic.     Right Ear: Decreased hearing noted.     Left Ear:  Decreased hearing noted.  Cardiovascular:     Rate and Rhythm: Normal rate and regular rhythm.     Pulses: Normal pulses.     Heart sounds: Normal heart sounds. No murmur. No friction rub. No gallop.   Pulmonary:     Effort: Pulmonary effort is normal. No respiratory distress.     Breath sounds: Normal breath sounds. No stridor. No wheezing, rhonchi or rales.  Chest:     Chest wall: No tenderness.  Abdominal:     General: Abdomen is protuberant. Bowel sounds are normal.     Palpations: Abdomen is soft.     Tenderness: There is no abdominal tenderness. There is no right CVA tenderness, left CVA tenderness, guarding or rebound.  Musculoskeletal:     Lumbar back: She exhibits tenderness. She exhibits no spasm.  Skin:    General: Skin is warm.     Capillary Refill: Capillary refill takes less than 2 seconds.  Neurological:     Mental Status: She is alert and oriented to person, place, and time.  Psychiatric:        Mood and Affect: Mood normal.        Behavior: Behavior normal.        Thought Content: Thought content normal.        Judgment: Judgment normal.       Assessment & Plan:   1. Physical deconditioning   2. Need for influenza vaccination   3. Hospital discharge follow-up   4. Chronic anticoagulation   5. Essential hypertension   6. Muscular deconditioning     Hospital discharge follow-up Continue all medications as directed, confirmed that you re-started your Xarelto. Remain well hydrated, follow Heart Healthy  Diet. Please keep your follow-up with Gastroenterology tomorrow. Home Physical Therapy order placed. Follow-up in 3 months. Continue to social distance and wear a mask when in public.  Chronic anticoagulation She has resumed Xarelto- pt and daughter confirmed.   Essential hypertension BP at goal 132/80, HR 73  Muscular deconditioning Home PT order placed     FOLLOW-UP:  Return in about 3 months (around 02/01/2020) for Regular Follow Up. kd

## 2019-11-01 NOTE — Patient Instructions (Signed)
Cholelithiasis  Cholelithiasis is also called "gallstones." It is a kind of gallbladder disease. The gallbladder is an organ that stores a liquid (bile) that helps you digest fat. Gallstones may not cause symptoms (may be silent gallstones) until they cause a blockage, and then they can cause pain (gallbladder attack). Follow these instructions at home:  Take over-the-counter and prescription medicines only as told by your doctor.  Stay at a healthy weight.  Eat healthy foods. This includes: ? Eating fewer fatty foods, like fried foods. ? Eating fewer refined carbs (refined carbohydrates). Refined carbs are breads and grains that are highly processed, like white bread and white rice. Instead, choose whole grains like whole-wheat bread and brown rice. ? Eating more fiber. Almonds, fresh fruit, and beans are healthy sources of fiber.  Keep all follow-up visits as told by your doctor. This is important. Contact a doctor if:  You have sudden pain in the upper right side of your belly (abdomen). Pain might spread to your right shoulder or your chest. This may be a sign of a gallbladder attack.  You feel sick to your stomach (are nauseous).  You throw up (vomit).  You have been diagnosed with gallstones that have no symptoms and you get: ? Belly pain. ? Discomfort, burning, or fullness in the upper part of your belly (indigestion). Get help right away if:  You have sudden pain in the upper right side of your belly, and it lasts for more than 2 hours.  You have belly pain that lasts for more than 5 hours.  You have a fever or chills.  You keep feeling sick to your stomach or you keep throwing up.  Your skin or the whites of your eyes turn yellow (jaundice).  You have dark-colored pee (urine).  You have light-colored poop (stool). Summary  Cholelithiasis is also called "gallstones."  The gallbladder is an organ that stores a liquid (bile) that helps you digest fat.  Silent  gallstones are gallstones that do not cause symptoms.  A gallbladder attack may cause sudden pain in the upper right side of your belly. Pain might spread to your right shoulder or your chest. If this happens, contact your doctor.  If you have sudden pain in the upper right side of your belly that lasts for more than 2 hours, get help right away. This information is not intended to replace advice given to you by your health care provider. Make sure you discuss any questions you have with your health care provider. Document Released: 05/31/2008 Document Revised: 11/25/2017 Document Reviewed: 08/29/2016 Elsevier Patient Education  2020 [place] all medications as directed, confirmed that you re-started your Xarelto. Remain well hydrated, follow Heart Healthy Diet. Please keep your follow-up with Gastroenterology tomorrow. Home Physical Therapy order placed. Follow-up in 3 months. Continue to social distance and wear a mask when in public. GREAT TO SEE YOU!

## 2019-11-01 NOTE — Assessment & Plan Note (Signed)
Continue all medications as directed, confirmed that you re-started your Xarelto. Remain well hydrated, follow Heart Healthy Diet. Please keep your follow-up with Gastroenterology tomorrow. Home Physical Therapy order placed. Follow-up in 3 months. Continue to social distance and wear a mask when in public.

## 2019-11-01 NOTE — Assessment & Plan Note (Signed)
Home PT order placed.

## 2019-11-01 NOTE — Assessment & Plan Note (Signed)
BP at goal 132/80, HR 73

## 2019-11-02 ENCOUNTER — Encounter: Payer: Self-pay | Admitting: Gastroenterology

## 2019-11-02 ENCOUNTER — Ambulatory Visit: Payer: Medicare PPO | Admitting: Gastroenterology

## 2019-11-02 ENCOUNTER — Telehealth: Payer: Self-pay

## 2019-11-02 VITALS — BP 122/70 | HR 60 | Ht 60.0 in | Wt 147.0 lb

## 2019-11-02 DIAGNOSIS — R1310 Dysphagia, unspecified: Secondary | ICD-10-CM

## 2019-11-02 DIAGNOSIS — Z8719 Personal history of other diseases of the digestive system: Secondary | ICD-10-CM | POA: Diagnosis not present

## 2019-11-02 DIAGNOSIS — K8050 Calculus of bile duct without cholangitis or cholecystitis without obstruction: Secondary | ICD-10-CM

## 2019-11-02 NOTE — Progress Notes (Signed)
11/02/2019 Christina Ortiz [id number] 07/08/1929   HISTORY OF PRESENT ILLNESS: This is a pleasant 85 year old female who is a patient of Dr. Lynne Leader.  She was recently hospitalized for choledocholithiasis.    She underwent ERCP with Dr. Ardis Hughs on 10/22 and found to the have the following:  Choledocholithiasis, partially treated with biliary sphincterotomy, balloon dilation of the ampulla, balloon sweeping and then eventual biliary stent placement.  Plan was for repeat ERCP with attempt at complete stone removal and removal of biliary stent in 6 to 8 weeks.  She feels good.  No complaints of abdominal pain.  No fevers or chills.  She does complain of dysphagia.  Has history of moderate esophageal stenosis as seen on EGD in November 2019 that was dilated to 14 mm with savory dilator at that time.  She says that this is continued to be an issue and is asking for repeat dilation and if this could be dilated further.  She is on Xarelto for history of atrial fibrillation.  This is prescribed by her cardiologist, Dr. Marlou Porch.   Past Medical History:  Diagnosis Date  . Acute bronchitis 10/2017  . Arthritis   . CKD (chronic kidney disease)    Xarelto dose is 15 mg QD  . Coronary artery disease    a. s/p PCI/stenting to RCA and LAD ([facility] in [place]) in 2006; b. Nuc 1/15: No ischemia, EF 70%  . Depression   . Gallstones   . Gastritis   . Hiatal hernia   . Hip fracture ([place])   . History of cholecystectomy   . History of echocardiogram    a. Echo 1/15: Moderate LVH, EF 55-60%, impaired relaxation, mild AS  . Hyperlipidemia   . Hypertension   . Hypokalemia   . Mobitz type 1 second degree atrioventricular block    a. Event Monitor 3/15: NSR, first-degree AV block, second-degree AB block - Mobitz 1, PACs, NSVT (5 beats)  . PAF (paroxysmal atrial fibrillation) ([place])   . Thyroid disease    Past Surgical History:  Procedure Laterality Date  . ANGIOPLASTY     with sent placement   . BILIARY DILATION  10/18/2019   Procedure: BILIARY DILATION;  Surgeon: Milus Banister, MD;  Location: [facility];  Service: Endoscopy;;  . BILIARY STENT PLACEMENT N/A 10/18/2019   Procedure: BILIARY STENT PLACEMENT;  Surgeon: Milus Banister, MD;  Location: [facility];  Service: Endoscopy;  Laterality: N/A;  . BLEPHAROPLASTY    . BREAST LUMPECTOMY Left   . CARDIAC CATHETERIZATION    . CATARACT EXTRACTION Bilateral   . CHOLECYSTECTOMY    . DILATION AND CURETTAGE OF UTERUS    . ERCP N/A 10/18/2019   Procedure: ENDOSCOPIC RETROGRADE CHOLANGIOPANCREATOGRAPHY (ERCP);  Surgeon: Milus Banister, MD;  Location: [facility];  Service: Endoscopy;  Laterality: N/A;  . REMOVAL OF STONES  10/18/2019   Procedure: REMOVAL OF STONES;  Surgeon: Milus Banister, MD;  Location: [facility];  Service: Endoscopy;;  . Joan Mayans  10/18/2019   Procedure: Joan Mayans;  Surgeon: Milus Banister, MD;  Location: [facility];  Service: Endoscopy;;    reports that she quit smoking about 5 years ago. Her smoking use included cigarettes. She has a 33.50 pack-year smoking history. She has never used smokeless tobacco. She reports current alcohol use of about 10.0 standard drinks of alcohol per week. She reports that she does not use drugs. family history includes Esophageal cancer in her paternal uncle; Heart attack in her mother;  Heart disease in her mother; Heart failure in her brother; Lung cancer in her father; Lymphoma in her brother. Allergies  Allergen Reactions  . Tape Other (See Comments)    Patient's skin is thin and will TEAR AND BRUISE EASILY      Outpatient Encounter Medications as of 11/02/2019  Medication Sig  . acetaminophen (TYLENOL) 325 MG tablet Take 650 mg by mouth every 6 (six) hours as needed for fever.   Marland Kitchen amLODipine (NORVASC) 5 MG tablet Take 1 tablet (5 mg total) by mouth daily.  Marland Kitchen antiseptic oral rinse (BIOTENE) LIQD 15 mLs by Mouth Rinse route daily as needed for dry  mouth.   . calcitonin, salmon, (MIACALCIN/FORTICAL) 200 UNIT/ACT nasal spray Place 1 spray into alternate nostrils daily.   . celecoxib (CELEBREX) 200 MG capsule Take 200 mg by mouth 2 (two) times daily.   . cholecalciferol (VITAMIN D) 1000 units tablet Take 1,000 Units by mouth every evening.  . folic acid (FOLVITE) 1 MG tablet TAKE 1 TABLET BY MOUTH DAILY (Patient taking differently: Take 1 mg by mouth daily. )  . furosemide (LASIX) 20 MG tablet Take 1 tablet (20 mg total) by mouth daily.  Marland Kitchen ipratropium-albuterol (DUONEB) 0.5-2.5 (3) MG/3ML SOLN Take 3 mLs by nebulization every 6 (six) hours as needed (sob and wheezing).   Marland Kitchen levothyroxine (SYNTHROID) 75 MCG tablet TAKE 1 TABLET BY MOUTH DAILY BEFORE BREAKFAST. (Patient taking differently: Take 75 mcg by mouth daily before breakfast. )  . magnesium oxide (MAG-OX) 400 MG tablet Take 400 mg by mouth daily.  . metoprolol tartrate (LOPRESSOR) 25 MG tablet TAKE 1/2 TABLET BY MOUTH TWICE A DAY.  . multivitamin (PROSIGHT) TABS tablet Take 1 tablet by mouth daily.  Marland Kitchen omeprazole (PRILOSEC) 20 MG capsule TAKE 1 CAPSULE BY MOUTH DAILY AS NEEDED FOR HEARTBURN. (Patient taking differently: Take 20 mg by mouth daily. )  . oxybutynin (DITROPAN-XL) 10 MG 24 hr tablet Take 10 mg by mouth at bedtime.   Marland Kitchen PARoxetine (PAXIL) 40 MG tablet Take 1 tablet (40 mg total) by mouth daily.  . polyethylene glycol (MIRALAX / GLYCOLAX) packet Take 17 g by mouth daily.  . potassium chloride SA (K-DUR) 20 MEQ tablet Take 1 tablet (20 mEq total) by mouth daily.  Marland Kitchen Propylene Glycol (SYSTANE BALANCE) 0.6 % SOLN Place 1 drop into both eyes daily.  . quinapril (ACCUPRIL) 40 MG tablet TAKE 1 TABLET (40 MG TOTAL) BY MOUTH DAILY.  . simvastatin (ZOCOR) 5 MG tablet TAKE 1 TABLET BY MOUTH EVERY EVENING. (Patient taking differently: Take 5 mg by mouth at bedtime. )  . tolterodine (DETROL LA) 4 MG 24 hr capsule Take 1 capsule by mouth daily.  . traMADol (ULTRAM) 50 MG tablet Take 50 mg by  mouth every 6 (six) hours as needed for moderate pain or severe pain.   Marland Kitchen XARELTO 15 MG TABS tablet TAKE 1 TABLET (15 MG TOTAL) BY MOUTH DAILY WITH SUPPER.   No facility-administered encounter medications on file as of 11/02/2019.      REVIEW OF SYSTEMS  : All other systems reviewed and negative except where noted in the History of Present Illness.   PHYSICAL EXAM: BP 122/70   Pulse 60   Ht 5' (1.524 m)   Wt 147 lb (66.7 kg)   BMI 28.71 kg/m  General: Well developed white female in no acute distress Head: Normocephalic and atraumatic Eyes:  Sclerae anicteric, conjunctiva pink. Ears: Normal auditory acuity Lungs: Clear throughout to auscultation; no increased WOB.  Heart: Regular rate and rhythm; no M/R/G. Abdomen: Soft, non-distended.  BS present.  Non-tender. Musculoskeletal: Symmetrical with no gross deformities  Skin: No lesions on visible extremities Extremities: No edema  Neurological: Alert oriented x 4, grossly non-focal Psychological:  Alert and cooperative. Normal mood and affect  ASSESSMENT AND PLAN: *Choledocholithiasis, partially treated with biliary sphincterotomy, balloon dilation of the ampulla, balloon sweeping and then eventual biliary stent placement on October 22.  Needs repeat ERCP with reattempted stone removal and stent removal at a 6 to 8-week interval.  We will schedule with Dr. Russella Dar at [facility]. *Dysphagia with history of moderate esophageal stenosis/stricture previously dilated to 14 mm with Savary dilation in 10/2018.  Continues with complaints of dysphagia.  We will plan for repeat EGD with dilation at the time of her ERCP. *Chronic anticoagulation with Xarelto due to history of atrial fibrillation:  Will hold Xarelto for 2 days prior to endoscopic procedures - will instruct when and how to resume after procedure. Benefits and risks of procedure explained including risks of bleeding, perforation, infection, missed lesions, reactions to  medications and possible need for hospitalization and surgery for complications. Additional rare but real risk of stroke or other vascular clotting events off of Xarelto also explained and need to seek urgent help if any signs of these problems occur. Will communicate by phone or EMR with patient's prescribing provider, Dr. Anne Fu, to confirm that holding Xarelto is reasonable in this case.    CC:  Julaine Fusi, NP

## 2019-11-02 NOTE — Telephone Encounter (Signed)
   Primary Cardiologist: Candee Furbish, MD  Chart reviewed as part of pre-operative protocol coverage. Given past medical history and time since last visit, based on ACC/AHA guidelines, Christina Ortiz would be at acceptable risk for the planned procedure without further cardiovascular testing.   Patient with diagnosis of afib on Xarelto for anticoagulation.   Procedure: EGD/ERCP Date of procedure: 12/17/2019  CHADS2-VASc score of  5 (HTN, AGE, CAD, AGE, female)  CrCl 27 ml/min  Per office protocol, patient can hold Xarelto for 2 days prior to procedure.    I will route this recommendation to the requesting party via Epic fax function and remove from pre-op pool.  Please call with questions.  Kathyrn Drown, NP 11/02/2019, 2:51 PM

## 2019-11-02 NOTE — Patient Instructions (Signed)
If you are age 83 or older, your body mass index should be between 23-30. Your Body mass index is 28.71 kg/m. If this is out of the aforementioned range listed, please consider follow up with your Primary Care Provider.  If you are age 54 or younger, your body mass index should be between 19-25. Your Body mass index is 28.71 kg/m. If this is out of the aformentioned range listed, please consider follow up with your Primary Care Provider.   You have been scheduled for an endoscopy. Please follow written instructions given to you at your visit today. If you use inhalers (even only as needed), please bring them with you on the day of your procedure. Your physician has requested that you go to www.startemmi.com and enter the access code given to you at your visit today. This web site gives a general overview about your procedure. However, you should still follow specific instructions given to you by our office regarding your preparation for the procedure.  You will be contacted by our office prior to your procedure for directions on holding your Xarelto.  If you do not hear from our office 1 week prior to your scheduled procedure, please call [phone] to discuss.   Due to recent COVID-19 restrictions implemented by our local and state authorities and in an effort to keep both patients and staff as safe as possible, our hospital system now requires COVID-19 testing prior to any scheduled hospital procedure. Please go to our [facility] location drive thru testing site ([address] [place], [place] [address]) on 12/13/19 at  9 am. There will be multiple testing areas, the first checkpoint being for pre-procedure/surgery testing. Get into the right (yellow) lane that leads to the PAT testing team. You will not be billed at the time of testing but may receive a bill later depending on your insurance. The approximate cost of the test is $100. You must agree to quarantine from the time of your  testing until the procedure date on 12/17/19 . This should include staying at home with ONLY the people you live with. Avoid take-out, grocery store shopping or leaving the house for any non-emergent reason. Failure to have your COVID-19 test done on the date and time you have been scheduled will result in cancellation of procedure. Please call our office at [phone] if you have any questions.   Thank you for choosing me and [facility].   Alonza Bogus, PA-C

## 2019-11-02 NOTE — Telephone Encounter (Signed)
[facility] Pre-operative Risk Assessment     Request for surgical clearance:     Endoscopy Procedure  What type of surgery is being performed?     EGD/ERCP  When is this surgery scheduled?     12/17/19  What type of clearance is required ?   Pharmacy  Are there any medications that need to be held prior to surgery and how long? [place] TWO DAYS PRIOR  Practice name and name of physician performing surgery?      [facility]/Dr. Fuller Plan  What is your office phone and fax number?      Phone- [phone]  Fax- [phone] Attn: Peter Congo, RMA  Anesthesia type (None, local, MAC, general) ?       MAC

## 2019-11-02 NOTE — Telephone Encounter (Signed)
Patient with diagnosis of afib on Xarelto for anticoagulation.    Procedure: EGD/ERCP Date of procedure: 12/17/2019  CHADS2-VASc score of  5 (HTN, AGE, CAD, AGE, female)  CrCl 27 ml/min  Per office protocol, patient can hold Xarelto for 2 days prior to procedure.

## 2019-11-05 NOTE — Progress Notes (Signed)
Reviewed and agree with management plan.  Laqueisha Catalina T. Anabelen Kaminsky, MD FACG [facility]  

## 2019-11-06 NOTE — Telephone Encounter (Signed)
Spoke with patients daughter.  She will make sure her mother holds Xarelto two days prior to procedure.

## 2019-11-13 ENCOUNTER — Telehealth: Payer: Self-pay | Admitting: Adult Health

## 2019-11-13 DIAGNOSIS — R29898 Other symptoms and signs involving the musculoskeletal system: Secondary | ICD-10-CM

## 2019-11-13 NOTE — Telephone Encounter (Signed)
Patient's daughter/Carla Burt Ek called requested referral for in home physical therapy to be provided by [facility] (CONTACT # [phone]) this agency approved by [facility] pt's health insurance.  --Forwarding request to medical asst for review & approval by PCP Valetta Fuller   & to [place] /referral coord.  ---IF any questions please call Ms Burt Ek [phone]  --glh

## 2019-11-14 NOTE — Telephone Encounter (Signed)
Referral for home PT placed d/t muscular deconditioning.  Charyl Bigger, CMA

## 2019-11-15 ENCOUNTER — Ambulatory Visit: Payer: Medicare PPO | Admitting: Adult Health

## 2019-11-15 DIAGNOSIS — M4856XD Collapsed vertebra, not elsewhere classified, lumbar region, subsequent encounter for fracture with routine healing: Secondary | ICD-10-CM | POA: Diagnosis not present

## 2019-11-17 DIAGNOSIS — I10 Essential (primary) hypertension: Secondary | ICD-10-CM | POA: Diagnosis not present

## 2019-11-19 ENCOUNTER — Telehealth: Payer: Self-pay | Admitting: *Deleted

## 2019-11-19 NOTE — Telephone Encounter (Signed)
   Primary Cardiologist: Candee Furbish, MD  Chart reviewed as part of pre-operative protocol coverage. Given past medical history and time since last visit, based on ACC/AHA guidelines, Miko Schreier would be at acceptable risk for the planned procedure without further cardiovascular testing.   Followed by Dr. Marlou Porch, last seen in August of 2020.  Pharmacy has made recommendations to hold Xarelto for 3 days prior to procedure.   Procedure: L2 - Kyphoplasty  Date of Procedure 07/02/2020 Hold Xarelto on 06/29/2020.   I will route this recommendation to the requesting party via Epic fax function and remove from pre-op pool.  Please call with questions.  Phill Myron. Litzy Dicker DNP, ANP, AACC  11/19/2019, 1:18 PM

## 2019-11-19 NOTE — Telephone Encounter (Signed)
Pt takes Xarelto for afib with CHADS2VASc score of 5 (age x2, sex, HTN, CAD). SCr 1.21, CrCl 34, pt on appropriately reduced dose of Xarelto. Recommend holding Xarelto for 3 days prior to spinal procedure.

## 2019-11-19 NOTE — Telephone Encounter (Signed)
    [facility] Pre-operative Risk Assessment    Request for surgical clearance:  1. What type of surgery is being performed? L2 KYPHOLASTY   2. When is this surgery scheduled? 01/03/20 3.    4. What type of clearance is required (medical clearance vs. Pharmacy clearance to hold med vs. Both)? BOTH  5. Are there any medications that need to be held prior to surgery and how long? XARELTO   6. Practice name and name of physician performing surgery? [facility]; DR. [place]   7. What is your office phone number [phone]    7.   What is your office fax number [phone]  8.   Anesthesia type (None, local, MAC, general) ? CHOICE    Julaine Hua 11/19/2019, 12:43 PM  _________________________________________________________________   (provider comments below)

## 2019-11-20 ENCOUNTER — Ambulatory Visit: Payer: Self-pay | Admitting: Orthopedic Surgery

## 2019-11-23 DIAGNOSIS — I10 Essential (primary) hypertension: Secondary | ICD-10-CM | POA: Diagnosis not present

## 2019-12-01 DIAGNOSIS — I10 Essential (primary) hypertension: Secondary | ICD-10-CM | POA: Diagnosis not present

## 2019-12-06 DIAGNOSIS — I10 Essential (primary) hypertension: Secondary | ICD-10-CM | POA: Diagnosis not present

## 2019-12-10 ENCOUNTER — Ambulatory Visit (INDEPENDENT_AMBULATORY_CARE_PROVIDER_SITE_OTHER): Payer: Medicare PPO | Admitting: Adult Health

## 2019-12-10 ENCOUNTER — Encounter: Payer: Self-pay | Admitting: Adult Health

## 2019-12-10 ENCOUNTER — Other Ambulatory Visit: Payer: Self-pay

## 2019-12-10 VITALS — BP 157/91 | HR 77 | Temp 98.2°F | Ht 60.0 in | Wt 145.9 lb

## 2019-12-10 DIAGNOSIS — R7989 Other specified abnormal findings of blood chemistry: Secondary | ICD-10-CM

## 2019-12-10 DIAGNOSIS — F1010 Alcohol abuse, uncomplicated: Secondary | ICD-10-CM | POA: Diagnosis not present

## 2019-12-10 DIAGNOSIS — M25471 Effusion, right ankle: Secondary | ICD-10-CM

## 2019-12-10 DIAGNOSIS — Z01818 Encounter for other preprocedural examination: Secondary | ICD-10-CM

## 2019-12-10 DIAGNOSIS — I480 Paroxysmal atrial fibrillation: Secondary | ICD-10-CM | POA: Diagnosis not present

## 2019-12-10 DIAGNOSIS — I10 Essential (primary) hypertension: Secondary | ICD-10-CM

## 2019-12-10 DIAGNOSIS — M25472 Effusion, left ankle: Secondary | ICD-10-CM | POA: Diagnosis not present

## 2019-12-10 DIAGNOSIS — Z7189 Other specified counseling: Secondary | ICD-10-CM | POA: Diagnosis not present

## 2019-12-10 DIAGNOSIS — Z8719 Personal history of other diseases of the digestive system: Secondary | ICD-10-CM

## 2019-12-10 NOTE — Progress Notes (Signed)
Subjective:    Patient ID: Christina Ortiz, female    DOB: Nov 04, 1929, 85 y.o.   MRN: [id number]  HPI:  Christina Ortiz is here surgical clearance for  L2 - Kyphoplasty- schedule with Dr. Shon Baton Jan 03, 2020 She denies CP/chest tightness with exertion. She denies sig increase in fatigue. She has not been using any tobacco. She only consumes ETOH at meals with her daughter: 1-2 drinks per meal- approx 3 meals per week. She denies any ETOH use at home by herself. She denies any recent falls. She reports continued back pain that hinders ambulation. She infrequently uses her cane or walker.  Per Joni Reining, NP-C/Cardiology notes-  "Primary Cardiologist: Donato Schultz, MD  Chart reviewed as part of pre-operative protocol coverage. Given past medical history and time since last visit, based on ACC/AHA guidelines, Christina Ortiz would be at acceptable risk for the planned procedure without further cardiovascular testing.  Followed by Dr. Anne Fu, last seen in August of 2020. Pharmacy has made recommendations to hold Xarelto for 3 days prior to procedure.  Procedure: L2 - Kyphoplasty  Date of Procedure 07/02/2020 Hold Xarelto on 06/29/2020"  EKG preformed in clinic Atrial Fib, no ischemic changes noted. EKG in 10/17/2019-demonstrated Atrial Fib EKG 12/13/2018-SR with 1st degree AV Block  She has EGD scheduled 12/17/2019 with Dr. Russella Dar  Daughter at [facility] during OV Patient Care Team    Relationship Specialty Notifications Start End  William Hamburger D, NP PCP - General Family Medicine  03/30/17   Jake Bathe, MD PCP - Cardiology Cardiology  08/14/19   Mateo Flow, MD Consulting Physician Ophthalmology  03/30/17   [facility]    03/30/17   Chilton Greathouse, MD Consulting Physician Pulmonary Disease  04/27/18     Patient Active Problem List   Diagnosis Date Noted  . Surgical counseling visit 12/10/2019  . History of esophageal stricture 11/02/2019  . Muscular  deconditioning 11/01/2019  . Elevated LFTs   . Choledocholithiasis 10/16/2019  . Abnormal urinalysis 09/28/2019  . Adult failure to thrive 09/28/2019  . Dysphagia 09/06/2018  . Pain   . Closed displaced fracture of greater trochanter of left femur (HCC) 06/04/2018  . Hypoxia 06/03/2018  . Fall 03/31/2018  . Humerus shaft fracture-right 03/31/2018  . Rhabdomyolysis 03/31/2018  . Hypothermia 03/31/2018  . Healthcare maintenance 01/17/2018  . Nocturia 01/17/2018  . Macrocytosis 11/22/2017  . Acute bronchitis due to respiratory syncytial virus (RSV) 11/09/2017  . Acute respiratory failure with hypoxia (HCC) 11/09/2017  . Hypertension 11/09/2017  . PAF (paroxysmal atrial fibrillation) (HCC) 11/09/2017  . Coronary artery disease 11/09/2017  . CKD (chronic kidney disease) stage 2, GFR 60-89 ml/min 11/09/2017  . Hypothyroidism 11/09/2017  . Dyspnea 11/08/2017  . Upper respiratory tract infection 11/08/2017  . Elevated serum creatinine 06/21/2017  . Edema of both ankles 06/21/2017  . Diarrhea 05/25/2017  . Hypokalemia 05/25/2017  . Alcohol abuse 05/25/2017  . Depression, recurrent (HCC) 03/30/2017  . Lumbago 03/30/2017  . Influenza with respiratory manifestation 01/22/2017  . Coronary artery disease due to lipid rich plaque 04/04/2015  . Essential hypertension 04/04/2015  . Hyperlipidemia 04/04/2015  . Former smoker 04/04/2015  . Chronic anticoagulation 04/04/2015     Past Medical History:  Diagnosis Date  . Acute bronchitis 10/2017  . Arthritis   . CKD (chronic kidney disease)    Xarelto dose is 15 mg QD  . Coronary artery disease    a. s/p PCI/stenting to RCA and LAD ([facility] in [place])  in 2006; b. Nuc 1/15: No ischemia, EF 70%  . Depression   . Gallstones   . Gastritis   . Hiatal hernia   . Hip fracture ([place])   . History of cholecystectomy   . History of echocardiogram    a. Echo 1/15: Moderate LVH, EF 55-60%, impaired relaxation, mild AS  . Hyperlipidemia    . Hypertension   . Hypokalemia   . Mobitz type 1 second degree atrioventricular block    a. Event Monitor 3/15: NSR, first-degree AV block, second-degree AB block - Mobitz 1, PACs, NSVT (5 beats)  . PAF (paroxysmal atrial fibrillation) ([place])   . Thyroid disease      Past Surgical History:  Procedure Laterality Date  . ANGIOPLASTY     with sent placement  . BILIARY DILATION  10/18/2019   Procedure: BILIARY DILATION;  Surgeon: Milus Banister, MD;  Location: [facility];  Service: Endoscopy;;  . BILIARY STENT PLACEMENT N/A 10/18/2019   Procedure: BILIARY STENT PLACEMENT;  Surgeon: Milus Banister, MD;  Location: [facility];  Service: Endoscopy;  Laterality: N/A;  . BLEPHAROPLASTY    . BREAST LUMPECTOMY Left   . CARDIAC CATHETERIZATION    . CATARACT EXTRACTION Bilateral   . CHOLECYSTECTOMY    . DILATION AND CURETTAGE OF UTERUS    . ERCP N/A 10/18/2019   Procedure: ENDOSCOPIC RETROGRADE CHOLANGIOPANCREATOGRAPHY (ERCP);  Surgeon: Milus Banister, MD;  Location: [facility];  Service: Endoscopy;  Laterality: N/A;  . REMOVAL OF STONES  10/18/2019   Procedure: REMOVAL OF STONES;  Surgeon: Milus Banister, MD;  Location: [facility];  Service: Endoscopy;;  . Joan Mayans  10/18/2019   Procedure: Joan Mayans;  Surgeon: Milus Banister, MD;  Location: [facility];  Service: Endoscopy;;     Family History  Problem Relation Age of Onset  . Heart disease Mother   . Heart attack Mother   . Lung cancer Father   . Lymphoma Brother   . Heart failure Brother   . Esophageal cancer Paternal Uncle   . Colon cancer Neg Hx   . Colon polyps Neg Hx   . Rectal cancer Neg Hx   . Stomach cancer Neg Hx      Social History   Substance and Sexual Activity  Drug Use No     Social History   Substance and Sexual Activity  Alcohol Use Yes  . Alcohol/week: 10.0 standard drinks  . Types: 10 Shots of liquor per week   Comment: 7 a week     Social History   Tobacco Use   Smoking Status Former Smoker  . Packs/day: 0.50  . Years: 67.00  . Pack years: 33.50  . Types: Cigarettes  . Quit date: 10/24/2014  . Years since quitting: 5.1  Smokeless Tobacco Never Used  Tobacco Comment   quit 2015     Outpatient Encounter Medications as of 12/10/2019  Medication Sig  . acetaminophen (TYLENOL) 325 MG tablet Take 650 mg by mouth every 6 (six) hours as needed for moderate pain.   Marland Kitchen amLODipine (NORVASC) 5 MG tablet Take 1 tablet (5 mg total) by mouth daily.  Marland Kitchen antiseptic oral rinse (BIOTENE) LIQD 15 mLs by Mouth Rinse route daily as needed for dry mouth.   . celecoxib (CELEBREX) 200 MG capsule Take 200 mg by mouth 2 (two) times daily.   . cholecalciferol (VITAMIN D) 1000 units tablet Take 1,000 Units by mouth every evening.  . folic acid (FOLVITE) 1 MG tablet TAKE 1 TABLET BY  MOUTH DAILY (Patient taking differently: Take 1 mg by mouth daily. )  . furosemide (LASIX) 20 MG tablet Take 1 tablet (20 mg total) by mouth daily.  Marland Kitchen levothyroxine (SYNTHROID) 75 MCG tablet TAKE 1 TABLET BY MOUTH DAILY BEFORE BREAKFAST. (Patient taking differently: Take 75 mcg by mouth daily before breakfast. )  . magnesium oxide (MAG-OX) 400 MG tablet Take 400 mg by mouth daily.  . metoprolol tartrate (LOPRESSOR) 25 MG tablet TAKE 1/2 TABLET BY MOUTH TWICE A DAY. (Patient taking differently: Take 12.5 mg by mouth 2 (two) times daily. )  . multivitamin (PROSIGHT) TABS tablet Take 1 tablet by mouth daily.  Marland Kitchen omeprazole (PRILOSEC) 20 MG capsule TAKE 1 CAPSULE BY MOUTH DAILY AS NEEDED FOR HEARTBURN. (Patient taking differently: Take 20 mg by mouth daily. )  . oxybutynin (DITROPAN-XL) 10 MG 24 hr tablet Take 10 mg by mouth at bedtime.   Marland Kitchen PARoxetine (PAXIL) 40 MG tablet Take 1 tablet (40 mg total) by mouth daily.  . polyethylene glycol (MIRALAX / GLYCOLAX) packet Take 17 g by mouth daily.   . potassium chloride SA (K-DUR) 20 MEQ tablet Take 1 tablet (20 mEq total) by mouth daily.  Marland Kitchen Propylene  Glycol (SYSTANE BALANCE) 0.6 % SOLN Place 1 drop into both eyes daily.  . quinapril (ACCUPRIL) 40 MG tablet TAKE 1 TABLET (40 MG TOTAL) BY MOUTH DAILY.  . simvastatin (ZOCOR) 5 MG tablet TAKE 1 TABLET BY MOUTH EVERY EVENING. (Patient taking differently: Take 5 mg by mouth at bedtime. )  . tolterodine (DETROL LA) 4 MG 24 hr capsule Take 4 mg by mouth daily.   [name] 15 MG TABS tablet TAKE 1 TABLET (15 MG TOTAL) BY MOUTH DAILY WITH SUPPER. (Patient taking differently: Take 15 mg by mouth daily with supper. )   No facility-administered encounter medications on file as of 12/10/2019.    Allergies: Tape  Body mass index is 28.49 kg/m.  Blood pressure (!) 157/91, pulse 77, temperature 98.2 F (36.8 C), temperature source Oral, height 5' (1.524 m), weight 145 lb 14.4 oz (66.2 kg), SpO2 92 %.  Review of Systems  Constitutional: Positive for fatigue. Negative for activity change, appetite change, chills, diaphoresis, fever and unexpected weight change.  Eyes: Negative for visual disturbance.  Respiratory: Negative for cough, chest tightness, shortness of breath, wheezing and stridor.   Cardiovascular: Negative for chest pain, palpitations and leg swelling.  Gastrointestinal: Negative for abdominal distention, blood in stool, constipation, diarrhea, nausea and vomiting.  Endocrine: Negative for polydipsia, polyphagia and polyuria.  Musculoskeletal: Positive for arthralgias, back pain, gait problem, joint swelling and myalgias.  Neurological: Negative for dizziness and headaches.  Hematological: Negative for adenopathy. Does not bruise/bleed easily.       Objective:   Physical Exam Vitals and nursing note reviewed.  Constitutional:      General: She is not in acute distress.    Appearance: Normal appearance. She is not ill-appearing, toxic-appearing or diaphoretic.  HENT:     Right Ear: Decreased hearing noted.     Left Ear: Decreased hearing noted.  Eyes:     Extraocular Movements:  Extraocular movements intact.     Conjunctiva/sclera: Conjunctivae normal.     Pupils: Pupils are equal, round, and reactive to light.  Cardiovascular:     Rate and Rhythm: Normal rate and regular rhythm.     Pulses: Normal pulses.     Heart sounds: Normal heart sounds. No murmur. No friction rub. No gallop.   Pulmonary:  Effort: Pulmonary effort is normal. No respiratory distress.     Breath sounds: Normal breath sounds. No stridor. No wheezing, rhonchi or rales.  Chest:     Chest wall: No tenderness.  Musculoskeletal:     Right lower leg: 2+ Edema present.     Left lower leg: 2+ Edema present.  Skin:    General: Skin is warm and dry.     Capillary Refill: Capillary refill takes less than 2 seconds.  Neurological:     Mental Status: She is alert and oriented to person, place, and time.  Psychiatric:        Mood and Affect: Mood normal.        Behavior: Behavior normal.        Thought Content: Thought content normal.        Judgment: Judgment normal.       Assessment & Plan:   1. Pre-operative clearance   2. Elevated serum creatinine   3. Surgical counseling visit   4. PAF (paroxysmal atrial fibrillation) (HCC)   5. Essential hypertension   6. History of esophageal stricture   7. Edema of both ankles   8. Alcohol abuse     Elevated serum creatinine CMP drawn today  PAF (paroxysmal atrial fibrillation) (HCC) Xarelto 15mg  QD EKG preformed in clinic Atrial Fib, no ischemic changes noted. EKG in 10/17/2019 also demonstrated Atrial Fib EKG 12/13/2018-SR with 1st degree AV Block Message sent to Cards  Hypertension Amlodipine 5mg  QD,Metoprolol 25mg  1/2 tab BID, Furosemide 20mg  QD, Quinapril 40mg  QD  History of esophageal stricture EGD scheduled 12/17/2019  Edema of both ankles Stable Furosemide 20mg  QD  Alcohol abuse She only consumes ETOH at meals with her daughter: 1-2 drinks per meal- approx 3 meals per week. She denies any ETOH use at home by  herself. She denies any recent falls.  Surgical counseling visit Continue all medications as directed. Will send message to your Cardiologist, re: EKG demonstrated Atrial Fibrillation. Metabolic panel drawn today. Will contact you tomorrow with clearance decision. Continue to social distance and wear a mask when in public.  Sat on RA at rest 92% Sat with ambulation 89% No acute respiratory distress noted. Not currently using tobacco. Message sent to Cards, re: EKG demonstrated Atrial Fib    FOLLOW-UP:  Return if symptoms worsen or fail to improve.

## 2019-12-10 NOTE — Assessment & Plan Note (Signed)
CMP drawn today 

## 2019-12-10 NOTE — Assessment & Plan Note (Addendum)
Continue all medications as directed. Will send message to your Cardiologist, re: EKG demonstrated Atrial Fibrillation. Metabolic panel drawn today. Will contact you tomorrow with clearance decision. Continue to social distance and wear a mask when in public.  Sat on RA at rest 92% Sat with ambulation 89% No acute respiratory distress noted. Not currently using tobacco. Message sent to Cards, re: EKG demonstrated Atrial Fib

## 2019-12-10 NOTE — Assessment & Plan Note (Signed)
She only consumes ETOH at meals with her daughter: 1-2 drinks per meal- approx 3 meals per week. She denies any ETOH use at home by herself. She denies any recent falls.

## 2019-12-10 NOTE — Assessment & Plan Note (Signed)
Stable Furosemide 20mg  QD

## 2019-12-10 NOTE — Assessment & Plan Note (Signed)
EGD scheduled 12/17/2019

## 2019-12-10 NOTE — Assessment & Plan Note (Signed)
Amlodipine 5mg  QD,Metoprolol 25mg  1/2 tab BID, Furosemide 20mg  QD, Quinapril 40mg  QD

## 2019-12-10 NOTE — Assessment & Plan Note (Addendum)
Xarelto 15mg  QD EKG preformed in clinic Atrial Fib, no ischemic changes noted. EKG in 10/17/2019 also demonstrated Atrial Fib EKG 12/13/2018-SR with 1st degree AV Block Message sent to Cards

## 2019-12-10 NOTE — Patient Instructions (Signed)
Atrial Fibrillation Atrial fibrillation is a type of irregular or rapid heartbeat (arrhythmia). In atrial fibrillation, the top part of the heart (atria) quivers in a chaotic pattern. This makes the heart unable to pump blood normally. Having atrial fibrillation can increase your risk for other health problems, such as:  Blood can pool in the atria and form clots. If a clot travels to the brain, it can cause a stroke.  The heart muscle may weaken from the irregular blood flow. This can cause heart failure. Atrial fibrillation may start suddenly and stop on its own, or it may become a long-lasting problem. What are the causes? This condition is caused by some heart-related conditions or procedures, including:  High blood pressure. This is the most common cause.  Heart failure.  Heart valve conditions.  Inflammation of the sac that surrounds the heart (pericarditis).  Heart surgery.  Coronary artery disease.  Certain heart rhythm disorders, such as Wolf-Parkinson-White syndrome. Other causes include:  Pneumonia.  Obstructive sleep apnea.  Lung cancer.  Thyroid problems, especially if the thyroid is overactive (hyperthyroidism).  Excessive alcohol or drug use. Sometimes, the cause of this condition is not known. What increases the risk? This condition is more likely to develop in:  Older people.  People who smoke.  People who have diabetes mellitus.  People who are overweight (obese).  Athletes who exercise vigorously.  People who have a family history. What are the signs or symptoms? Symptoms of this condition include:  A feeling that your heart is beating rapidly or irregularly.  A feeling of discomfort or pain in your chest.  Shortness of breath.  Sudden light-headedness or weakness.  Getting tired easily during exercise. In some cases, there are no symptoms. How is this diagnosed? Your health care provider may be able to detect atrial fibrillation when  taking your pulse. If detected, this condition may be diagnosed with:  Electrocardiogram (ECG).  Ambulatory cardiac monitor. This device records your heartbeats for 24 hours or more.  Transthoracic echocardiogram (TTE) to evaluate how blood flows through your heart.  Transesophageal echocardiogram (TEE) to view more detailed images of your heart.  A stress test.  Imaging tests, such as a CT scan or chest X-ray.  Blood tests. How is this treated? This condition may be treated with:  Medicines to slow down the heart rate or bring the heart's rhythm back to normal.  Medicines to prevent blood clots from forming.  Electrical cardioversion. This delivers a low-energy shock to the heart to reset its rhythm.  Ablation. This procedure destroys the part of the heart tissue that sends abnormal signals.  Left atrial appendage occlusion/excision. This seals off a common place in the atria where blood clots can form (left atrial appendage). The goal of treatment is to prevent blood clots from forming and to keep your heart beating at a normal rate and rhythm. Treatment depends on underlying medical conditions and how you feel when you are experiencing fibrillation. Follow these instructions at home: Medicines  Take over-the counter and prescription medicines only as told by your health care provider.  If your health care provider prescribed a blood-thinning medicine (anticoagulant), take it exactly as told. Taking too much blood-thinning medicine can cause bleeding. Taking too little can enable a blood clot to form and travel to the brain, causing a stroke. Lifestyle      Do not use any products that contain nicotine or tobacco, such as cigarettes and e-cigarettes. If you need help quitting, ask your health   care provider.  Do not drink beverages that contain caffeine, such as coffee, soda, and tea.  Follow diet instructions as told by your health care provider.  Exercise regularly as  told by your health care provider.  Do not drink alcohol. General instructions  If you have obstructive sleep apnea, manage your condition as told by your health care provider.  Maintain a healthy weight. Do not use diet pills unless your health care provider approves. Diet pills may make heart problems worse.  Keep all follow-up visits as told by your health care provider. This is important. Contact a health care provider if you:  Notice a change in the rate, rhythm, or strength of your heartbeat.  Are taking an anticoagulant and you notice increased bruising.  Tire more easily when you exercise or exert yourself.  Have a sudden change in weight. Get help right away if you have:   Chest pain, abdominal pain, sweating, or weakness.  Difficulty breathing.  Blood in your vomit, stool (feces), or urine.  Any symptoms of a stroke. "BE FAST" is an easy way to remember the main warning signs of a stroke: ? B - Balance. Signs are dizziness, sudden trouble walking, or loss of balance. ? E - Eyes. Signs are trouble seeing or a sudden change in vision. ? F - Face. Signs are sudden weakness or numbness of the face, or the face or eyelid drooping on one side. ? A - Arms. Signs are weakness or numbness in an arm. This happens suddenly and usually on one side of the body. ? S - Speech. Signs are sudden trouble speaking, slurred speech, or trouble understanding what people say. ? T - Time. Time to call emergency services. Write down what time symptoms started.  Other signs of a stroke, such as: ? A sudden, severe headache with no known cause. ? Nausea or vomiting. ? Seizure. These symptoms may represent a serious problem that is an emergency. Do not wait to see if the symptoms will go away. Get medical help right away. Call your local emergency services (911 in the [place] Do not drive yourself to the hospital. Summary  Atrial fibrillation is a type of irregular or rapid heartbeat  (arrhythmia).  Symptoms include a feeling that your heart is beating fast or irregularly. In some cases, you may not have symptoms.  The condition is treated with medicines to slow down the heart rate or bring the heart's rhythm back to normal. You may also need blood-thinning medicines to prevent blood clots.  Get help right away if you have symptoms or signs of a stroke. This information is not intended to replace advice given to you by your health care provider. Make sure you discuss any questions you have with your health care provider. Document Released: 12/13/2005 Document Revised: 02/02/2018 Document Reviewed: 02/03/2018 Elsevier Patient Education  2020 [place] all medications as directed. Will send message to your Cardiologist, re: EKG demonstrated Atrial Fibrillation. Metabolic panel drawn today. Will contact you tomorrow with clearance decision. Continue to social distance and wear a mask when in public. GREAT TO SEE YOU!

## 2019-12-11 ENCOUNTER — Other Ambulatory Visit: Payer: Self-pay | Admitting: Adult Health

## 2019-12-11 ENCOUNTER — Encounter: Payer: Self-pay | Admitting: Gastroenterology

## 2019-12-11 ENCOUNTER — Other Ambulatory Visit: Payer: Self-pay | Admitting: Cardiology

## 2019-12-11 DIAGNOSIS — E876 Hypokalemia: Secondary | ICD-10-CM

## 2019-12-11 LAB — COMPREHENSIVE METABOLIC PANEL
ALT: 3 IU/L (ref 0–32)
AST: 19 IU/L (ref 0–40)
Albumin/Globulin Ratio: 1.2 (ref 1.2–2.2)
Albumin: 3.7 g/dL (ref 3.6–4.6)
Alkaline Phosphatase: 113 IU/L (ref 39–117)
BUN/Creatinine Ratio: 12 (ref 12–28)
BUN: 13 mg/dL (ref 8–27)
Bilirubin Total: 0.8 mg/dL (ref 0.0–1.2)
CO2: 27 mmol/L (ref 20–29)
Calcium: 9.3 mg/dL (ref 8.7–10.3)
Chloride: 101 mmol/L (ref 96–106)
Creatinine, Ser: 1.07 mg/dL — ABNORMAL HIGH (ref 0.57–1.00)
GFR calc Af Amer: 54 mL/min/{1.73_m2} — ABNORMAL LOW (ref >59)
GFR calc non Af Amer: 46 mL/min/{1.73_m2} — ABNORMAL LOW (ref >59)
Globulin, Total: 3.2 g/dL (ref 1.5–4.5)
Glucose: 109 mg/dL — ABNORMAL HIGH (ref 65–99)
Potassium: 3 mmol/L — ABNORMAL LOW (ref 3.5–5.2)
Sodium: 143 mmol/L (ref 134–144)
Total Protein: 6.9 g/dL (ref 6.0–8.5)

## 2019-12-12 DIAGNOSIS — I10 Essential (primary) hypertension: Secondary | ICD-10-CM | POA: Diagnosis not present

## 2019-12-13 ENCOUNTER — Other Ambulatory Visit (HOSPITAL_COMMUNITY)
Admission: RE | Admit: 2019-12-13 | Discharge: 2019-12-13 | Disposition: A | Discharge status: Home / Self Care | Payer: Medicare PPO | Source: Ambulatory Visit | Attending: Gastroenterology | Admitting: Gastroenterology

## 2019-12-13 ENCOUNTER — Telehealth: Payer: Self-pay

## 2019-12-13 DIAGNOSIS — Z01812 Encounter for preprocedural laboratory examination: Secondary | ICD-10-CM | POA: Diagnosis not present

## 2019-12-13 DIAGNOSIS — Z20828 Contact with and (suspected) exposure to other viral communicable diseases: Secondary | ICD-10-CM | POA: Insufficient documentation

## 2019-12-13 NOTE — Telephone Encounter (Signed)
Antonio, PT with [facility] called requesting to extend PT discharge to next week.  Advised Antonio this would be ok.  Charyl Bigger, CMA

## 2019-12-14 ENCOUNTER — Other Ambulatory Visit: Payer: Self-pay

## 2019-12-14 ENCOUNTER — Other Ambulatory Visit: Payer: Medicare PPO

## 2019-12-14 DIAGNOSIS — E876 Hypokalemia: Secondary | ICD-10-CM | POA: Diagnosis not present

## 2019-12-14 LAB — NOVEL CORONAVIRUS, NAA (HOSP ORDER, SEND-OUT TO REF LAB; TAT 18-24 HRS): SARS-CoV-2, NAA: NOT DETECTED

## 2019-12-15 LAB — COMPREHENSIVE METABOLIC PANEL
ALT: 4 IU/L (ref 0–32)
AST: 24 IU/L (ref 0–40)
Albumin/Globulin Ratio: 1.3 (ref 1.2–2.2)
Albumin: 4 g/dL (ref 3.6–4.6)
Alkaline Phosphatase: 120 IU/L — ABNORMAL HIGH (ref 39–117)
BUN/Creatinine Ratio: 16 (ref 12–28)
BUN: 15 mg/dL (ref 8–27)
Bilirubin Total: 0.9 mg/dL (ref 0.0–1.2)
CO2: 25 mmol/L (ref 20–29)
Calcium: 10.1 mg/dL (ref 8.7–10.3)
Chloride: 100 mmol/L (ref 96–106)
Creatinine, Ser: 0.95 mg/dL (ref 0.57–1.00)
GFR calc Af Amer: 62 mL/min/{1.73_m2} (ref >59)
GFR calc non Af Amer: 54 mL/min/{1.73_m2} — ABNORMAL LOW (ref >59)
Globulin, Total: 3.1 g/dL (ref 1.5–4.5)
Glucose: 95 mg/dL (ref 65–99)
Potassium: 4.2 mmol/L (ref 3.5–5.2)
Sodium: 143 mmol/L (ref 134–144)
Total Protein: 7.1 g/dL (ref 6.0–8.5)

## 2019-12-16 MED ORDER — SODIUM CHLORIDE 0.9 % IV SOLN
1.5000 g | Freq: Once | INTRAVENOUS | Status: AC
  Administered 2019-12-17: 1.5 g via INTRAVENOUS
  Filled 2019-12-16: qty 1.5
  Filled 2019-12-16 (×2): qty 4

## 2019-12-17 ENCOUNTER — Other Ambulatory Visit: Payer: Self-pay

## 2019-12-17 ENCOUNTER — Ambulatory Visit (HOSPITAL_COMMUNITY): Payer: Medicare PPO

## 2019-12-17 ENCOUNTER — Encounter (HOSPITAL_COMMUNITY)
Admission: RE | Disposition: A | Discharge status: Home / Self Care | Payer: Self-pay | Source: Home / Self Care | Attending: Gastroenterology

## 2019-12-17 ENCOUNTER — Encounter (HOSPITAL_COMMUNITY): Payer: Self-pay | Admitting: Gastroenterology

## 2019-12-17 ENCOUNTER — Ambulatory Visit (HOSPITAL_COMMUNITY): Payer: Medicare PPO | Admitting: Registered Nurse

## 2019-12-17 ENCOUNTER — Ambulatory Visit (HOSPITAL_COMMUNITY)
Admission: RE | Admit: 2019-12-17 | Discharge: 2019-12-17 | Disposition: A | Discharge status: Home / Self Care | Payer: Medicare PPO | Attending: Gastroenterology | Admitting: Gastroenterology

## 2019-12-17 DIAGNOSIS — F329 Major depressive disorder, single episode, unspecified: Secondary | ICD-10-CM | POA: Diagnosis not present

## 2019-12-17 DIAGNOSIS — M1990 Unspecified osteoarthritis, unspecified site: Secondary | ICD-10-CM | POA: Diagnosis not present

## 2019-12-17 DIAGNOSIS — N189 Chronic kidney disease, unspecified: Secondary | ICD-10-CM | POA: Insufficient documentation

## 2019-12-17 DIAGNOSIS — K8020 Calculus of gallbladder without cholecystitis without obstruction: Secondary | ICD-10-CM

## 2019-12-17 DIAGNOSIS — R1310 Dysphagia, unspecified: Secondary | ICD-10-CM | POA: Diagnosis not present

## 2019-12-17 DIAGNOSIS — E785 Hyperlipidemia, unspecified: Secondary | ICD-10-CM | POA: Diagnosis not present

## 2019-12-17 DIAGNOSIS — Z4582 Encounter for adjustment or removal of myringotomy device (stent) (tube): Secondary | ICD-10-CM | POA: Diagnosis not present

## 2019-12-17 DIAGNOSIS — K222 Esophageal obstruction: Secondary | ICD-10-CM | POA: Diagnosis not present

## 2019-12-17 DIAGNOSIS — I129 Hypertensive chronic kidney disease with stage 1 through stage 4 chronic kidney disease, or unspecified chronic kidney disease: Secondary | ICD-10-CM | POA: Diagnosis not present

## 2019-12-17 DIAGNOSIS — Z9049 Acquired absence of other specified parts of digestive tract: Secondary | ICD-10-CM | POA: Insufficient documentation

## 2019-12-17 DIAGNOSIS — Z79899 Other long term (current) drug therapy: Secondary | ICD-10-CM | POA: Diagnosis not present

## 2019-12-17 DIAGNOSIS — Z87891 Personal history of nicotine dependence: Secondary | ICD-10-CM | POA: Diagnosis not present

## 2019-12-17 DIAGNOSIS — Z955 Presence of coronary angioplasty implant and graft: Secondary | ICD-10-CM | POA: Insufficient documentation

## 2019-12-17 DIAGNOSIS — K449 Diaphragmatic hernia without obstruction or gangrene: Secondary | ICD-10-CM | POA: Insufficient documentation

## 2019-12-17 DIAGNOSIS — I2510 Atherosclerotic heart disease of native coronary artery without angina pectoris: Secondary | ICD-10-CM | POA: Insufficient documentation

## 2019-12-17 DIAGNOSIS — K8051 Calculus of bile duct without cholangitis or cholecystitis with obstruction: Secondary | ICD-10-CM | POA: Diagnosis not present

## 2019-12-17 DIAGNOSIS — K5710 Diverticulosis of small intestine without perforation or abscess without bleeding: Secondary | ICD-10-CM | POA: Insufficient documentation

## 2019-12-17 DIAGNOSIS — I480 Paroxysmal atrial fibrillation: Secondary | ICD-10-CM | POA: Insufficient documentation

## 2019-12-17 DIAGNOSIS — Z791 Long term (current) use of non-steroidal anti-inflammatories (NSAID): Secondary | ICD-10-CM | POA: Insufficient documentation

## 2019-12-17 DIAGNOSIS — Z8719 Personal history of other diseases of the digestive system: Secondary | ICD-10-CM

## 2019-12-17 DIAGNOSIS — Z7901 Long term (current) use of anticoagulants: Secondary | ICD-10-CM | POA: Insufficient documentation

## 2019-12-17 DIAGNOSIS — E039 Hypothyroidism, unspecified: Secondary | ICD-10-CM | POA: Diagnosis not present

## 2019-12-17 DIAGNOSIS — K8050 Calculus of bile duct without cholangitis or cholecystitis without obstruction: Secondary | ICD-10-CM

## 2019-12-17 HISTORY — PX: ENDOSCOPIC RETROGRADE CHOLANGIOPANCREATOGRAPHY (ERCP) WITH PROPOFOL: SHX5810

## 2019-12-17 HISTORY — PX: BILIARY STENT PLACEMENT: SHX5538

## 2019-12-17 HISTORY — PX: BILIARY DILATION: SHX6850

## 2019-12-17 HISTORY — PX: SAVORY DILATION: SHX5439

## 2019-12-17 HISTORY — PX: STENT REMOVAL: SHX6421

## 2019-12-17 HISTORY — PX: REMOVAL OF STONES: SHX5545

## 2019-12-17 HISTORY — PX: ESOPHAGOGASTRODUODENOSCOPY (EGD) WITH PROPOFOL: SHX5813

## 2019-12-17 HISTORY — PX: SPHINCTEROTOMY: SHX5279

## 2019-12-17 SURGERY — ESOPHAGOGASTRODUODENOSCOPY (EGD) WITH PROPOFOL
Anesthesia: General

## 2019-12-17 MED ORDER — PROPOFOL 10 MG/ML IV BOLUS
INTRAVENOUS | Status: DC | PRN
  Administered 2019-12-17: 80 mg via INTRAVENOUS

## 2019-12-17 MED ORDER — LACTATED RINGERS IV SOLN: INTRAVENOUS | Status: DC | PRN

## 2019-12-17 MED ORDER — INDOMETHACIN 50 MG RE SUPP
RECTAL | Status: AC
  Filled 2019-12-17: qty 2

## 2019-12-17 MED ORDER — PHENYLEPHRINE HCL (PRESSORS) 10 MG/ML IV SOLN
INTRAVENOUS | Status: DC | PRN
  Administered 2019-12-17: 80 ug via INTRAVENOUS

## 2019-12-17 MED ORDER — INDOMETHACIN 50 MG RE SUPP
RECTAL | Status: DC | PRN
  Administered 2019-12-17: 100 mg via RECTAL

## 2019-12-17 MED ORDER — PROPOFOL 10 MG/ML IV BOLUS
INTRAVENOUS | Status: AC
  Filled 2019-12-17: qty 40

## 2019-12-17 MED ORDER — SODIUM CHLORIDE 0.9 % IV SOLN
INTRAVENOUS | Status: DC | PRN
  Administered 2019-12-17: 65 mL

## 2019-12-17 MED ORDER — ONDANSETRON HCL 4 MG/2ML IJ SOLN
INTRAMUSCULAR | Status: DC | PRN
  Administered 2019-12-17: 4 mg via INTRAVENOUS

## 2019-12-17 MED ORDER — PHENYLEPHRINE HCL-NACL 10-0.9 MG/250ML-% IV SOLN
INTRAVENOUS | Status: DC | PRN
  Administered 2019-12-17: 30 ug/min via INTRAVENOUS

## 2019-12-17 MED ORDER — LIDOCAINE 2% (20 MG/ML) 5 ML SYRINGE
INTRAMUSCULAR | Status: DC | PRN
  Administered 2019-12-17: 25 mg via INTRAVENOUS

## 2019-12-17 MED ORDER — SODIUM CHLORIDE 0.9 % IV SOLN: INTRAVENOUS | Status: DC

## 2019-12-17 MED ORDER — GLUCAGON HCL RDNA (DIAGNOSTIC) 1 MG IJ SOLR
INTRAMUSCULAR | Status: AC
  Filled 2019-12-17: qty 1

## 2019-12-17 MED ORDER — FENTANYL CITRATE (PF) 100 MCG/2ML IJ SOLN
INTRAMUSCULAR | Status: DC | PRN
  Administered 2019-12-17 (×3): 25 ug via INTRAVENOUS

## 2019-12-17 MED ORDER — GLUCAGON HCL RDNA (DIAGNOSTIC) 1 MG IJ SOLR
INTRAMUSCULAR | Status: DC | PRN
  Administered 2019-12-17 (×4): .25 mg via INTRAVENOUS

## 2019-12-17 MED ORDER — INDOMETHACIN 50 MG RE SUPP: 100.0000 mg | Freq: Once | RECTAL | Status: DC

## 2019-12-17 MED ORDER — FENTANYL CITRATE (PF) 100 MCG/2ML IJ SOLN
INTRAMUSCULAR | Status: AC
  Filled 2019-12-17: qty 2

## 2019-12-17 MED ORDER — CIPROFLOXACIN IN D5W 400 MG/200ML IV SOLN
INTRAVENOUS | Status: AC
  Filled 2019-12-17: qty 200

## 2019-12-17 MED ORDER — ROCURONIUM BROMIDE 10 MG/ML (PF) SYRINGE
PREFILLED_SYRINGE | INTRAVENOUS | Status: DC | PRN
  Administered 2019-12-17: 5 mg via INTRAVENOUS

## 2019-12-17 MED ORDER — SUCCINYLCHOLINE CHLORIDE 200 MG/10ML IV SOSY
PREFILLED_SYRINGE | INTRAVENOUS | Status: DC | PRN
  Administered 2019-12-17: 120 mg via INTRAVENOUS

## 2019-12-17 SURGICAL SUPPLY — 14 items

## 2019-12-17 NOTE — Op Note (Addendum)
[facility] Patient Name: Christina Ortiz Procedure Date: 12/17/2019 MRN: [id number] Attending MD: Ladene Artist , MD Date of Birth: July 27, 1929 CSN: [id number] Age: 85 Admit Type: Outpatient Procedure:                ERCP Indications:              Bile duct stone(s) Providers:                Pricilla Riffle. Fuller Plan, MD, Ashley Jacobs, RN, Elspeth Cho Tech., Technician, Janeece Agee, Technician,                            Enrigue Catena, CRNA Referring MD:             Mina Marble, NP Medicines:                General Anesthesia Complications:            No immediate complications. Estimated Blood Loss:     Estimated blood loss was minimal. Procedure:                Pre-Anesthesia Assessment:                           - Prior to the procedure, a History and Physical                            was performed, and patient medications and                            allergies were reviewed. The patient's tolerance of                            previous anesthesia was also reviewed. The risks                            and benefits of the procedure and the sedation                            options and risks were discussed with the patient.                            All questions were answered, and informed consent                            was obtained. Prior Anticoagulants: The patient has                            taken Xarelto (rivaroxaban), last dose was 2 days                            prior to procedure. ASA Grade Assessment: III - A  patient with severe systemic disease. After                            reviewing the risks and benefits, the patient was                            deemed in satisfactory condition to undergo the                            procedure.                           After obtaining informed consent, the scope was                            passed under direct vision. Throughout the                    procedure, the patient's blood pressure, pulse, and                            oxygen saturations were monitored continuously. The                            [id number]-[id number] ([id number] Olympus duodenoscope was                            introduced through the mouth, and used to inject                            contrast into and used to inject contrast into the                            bile duct. The ERCP was technically difficult and                            complex due to a large stone. The patient tolerated                            the procedure well. Scope In: Scope Out: Findings:      A scout film of the abdomen was obtained. Surgical clips, consistent       with a previous cholecystectomy, and a biliary stent were seen in the       area of the right upper quadrant of the abdomen. The esophagus was       successfully intubated under direct vision. The scope was advanced to       the major papilla in the descending duodenum without detailed       examination of the pharynx, larynx and associated structures, and upper       GI tract. Prior sphincterotomy, bilobed periampullary diverticulum and a       large hiatal hernia were noted. The upper GI tract was otherwise grossly       normal. The indwelling biliary stent was removed with a snare. A       straight Roadrunner wire was passed into the biliary  tree. The       short-nosed traction sphincterotome was passed over the guidewire and       the bile duct was then deeply cannulated. Contrast was injected. I       personally interpreted the bile duct images. Ductal flow of contrast was       adequate. The common bile duct was diffusely dilated, with stones       causing an obstruction. The largest diameter was 15 mm. A       cholecystectomy had been performed. The common bile duct contained two       stones and sludge, the largest of which was 10 mm in diameter. Dilation       of prior sphincterotomy with an 08-04-09 mm  balloon (to a maximum balloon       size of 10 mm) dilator was successful. The biliary tree was swept with a       12-15 mm balloon starting at the bifurcation multiple times. Sludge was       swept from the duct. A few small stones or stone fragments were removed.       Two larger stones remained and appeared to be impacted in the distal       CBD. Basket lithotripsy was attempted however the impacted distal CBD       stones could not be engaged. A 10 Fr by 7 cm plastic stent with a single       external flap and a single internal flap was placed 6 cm into the       biliary tree with the proximal end well above the distal CBD stones.       Bile flowed through the stent. The stent was in good position. The PD       was not cannulated or injected by intention. Impression:               - The common bile duct was dilated, with stones                            causing an obstruction.                           - Prior cholecystectomy.                           - Prior sphincterotomy.                           - Periampullary diverticulum.                           - Choledocholithiasis and sludge was found. Partial                            removal was accomplished by balloon extraction. 2                            larger stones remained.                           - Lithotripsy attempted, unsuccessful.                           -  Prior sphincterotomy was successfully dilated.                           - The biliary tree was swept.                           - One stent was exchanged in the biliary tree. Moderate Sedation:      Not Applicable - Patient had care per Anesthesia. Recommendation:           - Discharge patient to home (ambulatory).                           - Observe patient's clinical course following                            today's ERCP with therapeutic intervention.                           - Resume Xarelton in 3 days.                           - Return to GI clinic in 3  weeks. Consider ERCP                            with contact lithotripsy. Procedure Code(s):        --- Professional ---                           [phone], Endoscopic retrograde                            cholangiopancreatography (ERCP); with removal and                            exchange of stent(s), biliary or pancreatic duct,                            including pre- and post-dilation and guide wire                            passage, when performed, including sphincterotomy,                            when performed, each stent exchanged                           [id number], Endoscopic retrograde                            cholangiopancreatography (ERCP); with removal of                            calculi/debris from biliary/pancreatic duct(s) Diagnosis Code(s):        --- Professional ---  K80.51, Calculus of bile duct without cholangitis                            or cholecystitis with obstruction                           Z90.49, Acquired absence of other specified parts                            of digestive tract CPT copyright 2019 American Medical Association. All rights reserved. The codes documented in this report are preliminary and upon coder review may  be revised to meet current compliance requirements. [name], MD 12/17/2019 11:20:46 AM This report has been signed electronically. Number of Addenda: 0

## 2019-12-17 NOTE — Discharge Instructions (Signed)
Clear liquid diet for 2 hours, then advance as tolerated to soft diet today Resume Xarelto (rivaroxaban) at prior dose in 3 days. Return to GI clinic in 3 weeks. Consider ERCP with contact lithotripsy.   YOU HAD AN ENDOSCOPIC PROCEDURE TODAY: Refer to the procedure report and other information in the discharge instructions given to you for any specific questions about what was found during the examination. If this information does not answer your questions, please call [place] office at [phone] to clarify.   YOU SHOULD EXPECT: Some feelings of bloating in the abdomen. Passage of more gas than usual. Walking can help get rid of the air that was put into your GI tract during the procedure and reduce the bloating. If you had a lower endoscopy (such as a colonoscopy or flexible sigmoidoscopy) you may notice spotting of blood in your stool or on the toilet paper. Some abdominal soreness may be present for a day or two, also.  DIET: Your first meal following the procedure should be a light meal and then it is ok to progress to your normal diet. A half-sandwich or bowl of soup is an example of a good first meal. Heavy or fried foods are harder to digest and may make you feel nauseous or bloated. Drink plenty of fluids but you should avoid alcoholic beverages for 24 hours. If you had a esophageal dilation, please see attached instructions for diet.    ACTIVITY: Your care partner should take you home directly after the procedure. You should plan to take it easy, moving slowly for the rest of the day. You can resume normal activity the day after the procedure however YOU SHOULD NOT DRIVE, use power tools, machinery or perform tasks that involve climbing or major physical exertion for 24 hours (because of the sedation medicines used during the test).   SYMPTOMS TO REPORT IMMEDIATELY: A gastroenterologist can be reached at any hour. Please call [phone]  for any of the following symptoms:   Following  upper endoscopy (EGD, EUS, ERCP, esophageal dilation) Vomiting of blood or coffee ground material  New, significant abdominal pain  New, significant chest pain or pain under the shoulder blades  Painful or persistently difficult swallowing  New shortness of breath  Black, tarry-looking or red, bloody stools

## 2019-12-17 NOTE — Op Note (Signed)
[facility] Patient Name: Christina Ortiz Procedure Date: 12/17/2019 MRN: [id number] Attending MD: Ladene Artist , MD Date of Birth: 1929/08/16 CSN: [id number] Age: 85 Admit Type: Outpatient Procedure:                Upper GI endoscopy Indications:              Dysphagia Providers:                Pricilla Riffle. Fuller Plan, MD, Ashley Jacobs, RN, Elspeth Cho Tech., Technician, Janeece Agee, Technician,                            Enrigue Catena, CRNA Referring MD:             Mina Marble, NP Medicines:                General Anesthesia Complications:            No immediate complications. Estimated Blood Loss:     Estimated blood loss was minimal. Procedure:                Pre-Anesthesia Assessment:                           - Prior to the procedure, a History and Physical                            was performed, and patient medications and                            allergies were reviewed. The patient's tolerance of                            previous anesthesia was also reviewed. The risks                            and benefits of the procedure and the sedation                            options and risks were discussed with the patient.                            All questions were answered, and informed consent                            was obtained. Prior Anticoagulants: The patient has                            taken Xarelto (rivaroxaban), last dose was 2 days                            prior to procedure. ASA Grade Assessment: III - A  patient with severe systemic disease. After                            reviewing the risks and benefits, the patient was                            deemed in satisfactory condition to undergo the                            procedure.                           After obtaining informed consent, the endoscope was                            passed under direct vision. Throughout the                             procedure, the patient's blood pressure, pulse, and                            oxygen saturations were monitored continuously. The                            [id number]-[id number] ([id number]) Olympus gastroscope was                            introduced through the mouth, and advanced to the                            second part of duodenum. The upper GI endoscopy was                            accomplished without difficulty. The patient                            tolerated the procedure well. Scope In: Scope Out: Findings:      One benign-appearing, intrinsic moderate stenosis was found at the       gastroesophageal junction. This stenosis measured 1.2 cm (inner       diameter) x less than one cm (in length). The stenosis was traversed. A       guidewire was placed and the scope was withdrawn. Dilations were       performed with Savary dilators with mild resistance at 12.8 mm, 14 mm       with small heme on each.      The esophagus was tortuous and otherwise normal.      A large hiatal hernia was present.      A medium amount of food (residue) was found in the gastric body.      The exam of the stomach was otherwise normal.      A medium non-bleeding diverticulum was found at the major papilla.      A previously placed plastic stent was seen in the second portion of the       duodenum. Impression:               -  Benign-appearing esophageal stenosis. Dilated.                           - Tortuous esophagus.                           - Large hiatal hernia.                           - A medium amount of food (residue) in the stomach.                           - Non-bleeding duodenal diverticulum.                           - Plastic stent in the duodenum.                           - No specimens collected. Moderate Sedation:      Not Applicable - Patient had care per Anesthesia. Recommendation:           - Patient has a contact number available for                             emergencies. The signs and symptoms of potential                            delayed complications were discussed with the                            patient. Return to normal activities tomorrow.                            Written discharge instructions were provided to the                            patient.                           - Clear liquid diet for 2 hours, then advance as                            tolerated to soft diet today.                           - Resume prior diet tomorrow.                           - Continue present medications.                           - Resume Xarelto (rivaroxaban) at prior dose in 3                            days. Refer to managing physician for further  adjustment of therapy. Procedure Code(s):        --- Professional ---                           [phone], Esophagogastroduodenoscopy, flexible,                            transoral; with insertion of guide wire followed by                            passage of dilator(s) through esophagus over guide                            wire Diagnosis Code(s):        --- Professional ---                           K22.2, Esophageal obstruction                           K44.9, Diaphragmatic hernia without obstruction or                            gangrene                           R13.10, Dysphagia, unspecified                           K57.10, Diverticulosis of small intestine without                            perforation or abscess without bleeding CPT copyright 2019 American Medical Association. All rights reserved. The codes documented in this report are preliminary and upon coder review may  be revised to meet current compliance requirements. Meryl DareMalcolm T Cordero Surette, MD 12/17/2019 11:04:14 AM This report has been signed electronically. Number of Addenda: 0

## 2019-12-17 NOTE — H&P (Signed)
HISTORY OF PRESENT ILLNESS: This is a pleasant 85 year old female who is a patient of Dr. Ardell Isaacs.  She was recently hospitalized for choledocholithiasis.    She underwent ERCP with Dr. Christella Hartigan on 10/22 and found to the have the following:  Choledocholithiasis, partially treated with biliary sphincterotomy, balloon dilation of the ampulla, balloon sweeping and then eventual biliary stent placement.  Plan was for repeat ERCP with attempt at complete stone removal and removal of biliary stent in 6 to 8 weeks.  She feels good.  No complaints of abdominal pain.  No fevers or chills.  She does complain of dysphagia.  Has history of moderate esophageal stenosis as seen on EGD in November 2019 that was dilated to 14 mm with savory dilator at that time.  She says that this is continued to be an issue and is asking for repeat dilation and if this could be dilated further.  She is on Xarelto for history of atrial fibrillation.  This is prescribed by her cardiologist, Dr. Anne Fu.       Past Medical History:  Diagnosis Date  . Acute bronchitis 10/2017  . Arthritis   . CKD (chronic kidney disease)    Xarelto dose is 15 mg QD  . Coronary artery disease    a. s/p PCI/stenting to RCA and LAD ([facility] in [place]) in 2006; b. Nuc 1/15: No ischemia, EF 70%  . Depression   . Gallstones   . Gastritis   . Hiatal hernia   . Hip fracture (HCC)   . History of cholecystectomy   . History of echocardiogram    a. Echo 1/15: Moderate LVH, EF 55-60%, impaired relaxation, mild AS  . Hyperlipidemia   . Hypertension   . Hypokalemia   . Mobitz type 1 second degree atrioventricular block    a. Event Monitor 3/15: NSR, first-degree AV block, second-degree AB block - Mobitz 1, PACs, NSVT (5 beats)  . PAF (paroxysmal atrial fibrillation) (HCC)   . Thyroid disease         Past Surgical History:  Procedure Laterality Date  . ANGIOPLASTY     with sent placement  . BILIARY DILATION   10/18/2019   Procedure: BILIARY DILATION;  Surgeon: Rachael Fee, MD;  Location: [facility];  Service: Endoscopy;;  . BILIARY STENT PLACEMENT N/A 10/18/2019   Procedure: BILIARY STENT PLACEMENT;  Surgeon: Rachael Fee, MD;  Location: [facility];  Service: Endoscopy;  Laterality: N/A;  . BLEPHAROPLASTY    . BREAST LUMPECTOMY Left   . CARDIAC CATHETERIZATION    . CATARACT EXTRACTION Bilateral   . CHOLECYSTECTOMY    . DILATION AND CURETTAGE OF UTERUS    . ERCP N/A 10/18/2019   Procedure: ENDOSCOPIC RETROGRADE CHOLANGIOPANCREATOGRAPHY (ERCP);  Surgeon: Rachael Fee, MD;  Location: [facility];  Service: Endoscopy;  Laterality: N/A;  . REMOVAL OF STONES  10/18/2019   Procedure: REMOVAL OF STONES;  Surgeon: Rachael Fee, MD;  Location: [facility];  Service: Endoscopy;;  . Dennison Mascot  10/18/2019   Procedure: Dennison Mascot;  Surgeon: Rachael Fee, MD;  Location: [facility];  Service: Endoscopy;;    reports that she quit smoking about 5 years ago. Her smoking use included cigarettes. She has a 33.50 pack-year smoking history. She has never used smokeless tobacco. She reports current alcohol use of about 10.0 standard drinks of alcohol per week. She reports that she does not use drugs. family history includes Esophageal cancer in her paternal uncle; Heart attack in her mother; Heart  disease in her mother; Heart failure in her brother; Lung cancer in her father; Lymphoma in her brother.      Allergies  Allergen Reactions  . Tape Other (See Comments)    Patient's skin is thin and will TEAR AND BRUISE EASILY          Outpatient Encounter Medications as of 11/02/2019  Medication Sig  . acetaminophen (TYLENOL) 325 MG tablet Take 650 mg by mouth every 6 (six) hours as needed for fever.   Marland Kitchen amLODipine (NORVASC) 5 MG tablet Take 1 tablet (5 mg total) by mouth daily.  Marland Kitchen antiseptic oral rinse (BIOTENE) LIQD 15 mLs by Mouth Rinse route daily as  needed for dry mouth.   . calcitonin, salmon, (MIACALCIN/FORTICAL) 200 UNIT/ACT nasal spray Place 1 spray into alternate nostrils daily.   . celecoxib (CELEBREX) 200 MG capsule Take 200 mg by mouth 2 (two) times daily.   . cholecalciferol (VITAMIN D) 1000 units tablet Take 1,000 Units by mouth every evening.  . folic acid (FOLVITE) 1 MG tablet TAKE 1 TABLET BY MOUTH DAILY (Patient taking differently: Take 1 mg by mouth daily. )  . furosemide (LASIX) 20 MG tablet Take 1 tablet (20 mg total) by mouth daily.  Marland Kitchen ipratropium-albuterol (DUONEB) 0.5-2.5 (3) MG/3ML SOLN Take 3 mLs by nebulization every 6 (six) hours as needed (sob and wheezing).   Marland Kitchen levothyroxine (SYNTHROID) 75 MCG tablet TAKE 1 TABLET BY MOUTH DAILY BEFORE BREAKFAST. (Patient taking differently: Take 75 mcg by mouth daily before breakfast. )  . magnesium oxide (MAG-OX) 400 MG tablet Take 400 mg by mouth daily.  . metoprolol tartrate (LOPRESSOR) 25 MG tablet TAKE 1/2 TABLET BY MOUTH TWICE A DAY.  . multivitamin (PROSIGHT) TABS tablet Take 1 tablet by mouth daily.  Marland Kitchen omeprazole (PRILOSEC) 20 MG capsule TAKE 1 CAPSULE BY MOUTH DAILY AS NEEDED FOR HEARTBURN. (Patient taking differently: Take 20 mg by mouth daily. )  . oxybutynin (DITROPAN-XL) 10 MG 24 hr tablet Take 10 mg by mouth at bedtime.   Marland Kitchen PARoxetine (PAXIL) 40 MG tablet Take 1 tablet (40 mg total) by mouth daily.  . polyethylene glycol (MIRALAX / GLYCOLAX) packet Take 17 g by mouth daily.  . potassium chloride SA (K-DUR) 20 MEQ tablet Take 1 tablet (20 mEq total) by mouth daily.  Marland Kitchen Propylene Glycol (SYSTANE BALANCE) 0.6 % SOLN Place 1 drop into both eyes daily.  . quinapril (ACCUPRIL) 40 MG tablet TAKE 1 TABLET (40 MG TOTAL) BY MOUTH DAILY.  . simvastatin (ZOCOR) 5 MG tablet TAKE 1 TABLET BY MOUTH EVERY EVENING. (Patient taking differently: Take 5 mg by mouth at bedtime. )  . tolterodine (DETROL LA) 4 MG 24 hr capsule Take 1 capsule by mouth daily.  . traMADol (ULTRAM) 50 MG tablet  Take 50 mg by mouth every 6 (six) hours as needed for moderate pain or severe pain.   Marland Kitchen XARELTO 15 MG TABS tablet TAKE 1 TABLET (15 MG TOTAL) BY MOUTH DAILY WITH SUPPER.   No facility-administered encounter medications on file as of 11/02/2019.     REVIEW OF SYSTEMS  : All other systems reviewed and negative except where noted in the History of Present Illness.   PHYSICAL EXAM: BP 122/70   Pulse 60   Ht 5' (1.524 m)   Wt 147 lb (66.7 kg)   BMI 28.71 kg/m  General: Well developed white female in no acute distress Head: Normocephalic and atraumatic Eyes:  Sclerae anicteric, conjunctiva pink. Ears: Normal auditory acuity Lungs:  Clear throughout to auscultation; no increased WOB. Heart: Regular rate and rhythm; no M/R/G. Abdomen: Soft, non-distended.  BS present.  Non-tender. Musculoskeletal: Symmetrical with no gross deformities  Skin: No lesions on visible extremities Extremities: No edema  Neurological: Alert oriented x 4, grossly non-focal Psychological:  Alert and cooperative. Normal mood and affect   ASSESSMENT AND PLAN: *Choledocholithiasis, partially treated with biliary sphincterotomy, balloon dilation of the ampulla, balloon sweeping and then eventual biliary stent placement on October 22.  Needs repeat ERCP with reattempted stone removal and stent removal at a 6 to 8 week interval.  We will schedule with Dr. Fuller Plan at [facility]. *Dysphagia with history of moderate esophageal stenosis/stricture previously dilated to 14 mm with Savary dilation in 10/2018.  Continues with complaints of dysphagia.  We will plan for repeat EGD with dilation at the time of her ERCP. *Chronic anticoagulation with Xarelto due to history of atrial fibrillation:  Will hold Xarelto for 2 days prior to endoscopic procedures - will instruct when and how to resume after procedure. Benefits and risks of procedure explained including risks of bleeding, perforation, infection, missed lesions,  reactions to medications and possible need for hospitalization and surgery for complications. Additional rare but real risk of stroke or other vascular clotting events off of Xarelto also explained and need to seek urgent help if any signs of these problems occur. Will communicate by phone or EMR with patient's prescribing provider, Dr. Marlou Porch, to confirm that holding Xarelto is reasonable in this case.

## 2019-12-17 NOTE — Anesthesia Preprocedure Evaluation (Addendum)
Anesthesia Evaluation  Patient identified by MRN, date of birth, ID band Patient awake    Reviewed: Allergy & Precautions, NPO status , Patient's Chart, lab work & pertinent test results  Airway Mallampati: III  TM Distance: >3 FB Neck ROM: Full    Dental  (+) Teeth Intact, Dental Advisory Given   Pulmonary former smoker,    breath sounds clear to auscultation       Cardiovascular hypertension, Pt. on medications and Pt. on home beta blockers + CAD and + Cardiac Stents  + dysrhythmias Atrial Fibrillation  Rhythm:Irregular Rate:Normal     Neuro/Psych PSYCHIATRIC DISORDERS Depression  Neuromuscular disease    GI/Hepatic Neg liver ROS, hiatal hernia,   Endo/Other  Hypothyroidism   Renal/GU   negative genitourinary   Musculoskeletal  (+) Arthritis ,   Abdominal Normal abdominal exam  (+)   Peds  Hematology negative hematology ROS (+)   Anesthesia Other Findings   Reproductive/Obstetrics                            Anesthesia Physical Anesthesia Plan  ASA: III  Anesthesia Plan: General   Post-op Pain Management:    Induction: Intravenous  PONV Risk Score and Plan: 3 and Ondansetron  Airway Management Planned: Oral ETT  Additional Equipment: None  Intra-op Plan:   Post-operative Plan: Extubation in OR  Informed Consent:   Plan Discussed with: CRNA  Anesthesia Plan Comments:         Anesthesia Quick Evaluation

## 2019-12-17 NOTE — Transfer of Care (Signed)
Immediate Anesthesia Transfer of Care Note  Patient: Christina Ortiz  Procedure(s) Performed: ESOPHAGOGASTRODUODENOSCOPY (EGD) WITH PROPOFOL (N/A ) SAVORY DILATION (N/A ) ENDOSCOPIC RETROGRADE CHOLANGIOPANCREATOGRAPHY (ERCP) WITH PROPOFOL (N/A ) STENT REMOVAL REMOVAL OF STONES BILIARY DILATION SPHINCTEROTOMY BILIARY STENT PLACEMENT (N/A )  Patient Location: PACU  Anesthesia Type:General  Level of Consciousness: awake and patient cooperative  Airway & Oxygen Therapy: Patient Spontanous Breathing and Patient connected to face mask oxygen  Post-op Assessment: Report given to RN, Post -op Vital signs reviewed and stable and Patient moving all extremities X 4  Post vital signs: stable  Last Vitals:  Vitals Value Taken Time  BP 158/93 12/17/19 1111  Temp    Pulse 68 12/17/19 1111  Resp 17 12/17/19 1111  SpO2 99 % 12/17/19 1111    Last Pain:  Vitals:   12/17/19 0819  TempSrc: Oral  PainSc: 0-No pain         Complications: No apparent anesthesia complications

## 2019-12-17 NOTE — Anesthesia Postprocedure Evaluation (Signed)
Anesthesia Post Note  Patient: Christina Ortiz  Procedure(s) Performed: ESOPHAGOGASTRODUODENOSCOPY (EGD) WITH PROPOFOL (N/A ) SAVORY DILATION (N/A ) ENDOSCOPIC RETROGRADE CHOLANGIOPANCREATOGRAPHY (ERCP) WITH PROPOFOL (N/A ) STENT REMOVAL REMOVAL OF STONES BILIARY DILATION SPHINCTEROTOMY BILIARY STENT PLACEMENT (N/A )     Patient location during evaluation: Endoscopy Anesthesia Type: General Level of consciousness: awake and alert Pain management: pain level controlled Vital Signs Assessment: post-procedure vital signs reviewed and stable Respiratory status: spontaneous breathing, nonlabored ventilation and respiratory function stable Cardiovascular status: blood pressure returned to baseline and stable Postop Assessment: no apparent nausea or vomiting Anesthetic complications: no    Last Vitals:  Vitals:   12/17/19 1130 12/17/19 1140  BP: 131/71 120/76  Pulse: 67 70  Resp: 11 19  Temp:    SpO2: 94% 93%    Last Pain:  Vitals:   12/17/19 1140  TempSrc:   PainSc: 0-No pain                 Lidia Collum

## 2019-12-17 NOTE — Anesthesia Procedure Notes (Signed)
Procedure Name: Intubation Date/Time: 12/17/2019 9:39 AM Performed by: Lissa Morales, CRNA Pre-anesthesia Checklist: Patient identified, Emergency Drugs available, Suction available and Patient being monitored Patient Re-evaluated:Patient Re-evaluated prior to induction Oxygen Delivery Method: Circle system utilized Preoxygenation: Pre-oxygenation with 100% oxygen Induction Type: IV induction Ventilation: Mask ventilation without difficulty Laryngoscope Size: Mac and 4 Tube type: Oral Tube size: 7.5 mm Number of attempts: 1 Airway Equipment and Method: Stylet and Oral airway Placement Confirmation: ETT inserted through vocal cords under direct vision,  positive ETCO2 and breath sounds checked- equal and bilateral Secured at: 21 cm Tube secured with: Tape Dental Injury: Teeth and Oropharynx as per pre-operative assessment

## 2019-12-18 ENCOUNTER — Encounter: Payer: Self-pay | Admitting: *Deleted

## 2019-12-19 DIAGNOSIS — I10 Essential (primary) hypertension: Secondary | ICD-10-CM | POA: Diagnosis not present

## 2019-12-27 NOTE — Pre-Procedure Instructions (Signed)
Christina Ortiz  12/27/2019     Your procedure is scheduled on Thursday, January 7..  Report to [facility], Main Entrance or Entrance "A" at 5:30 AM   Call this number if you have problems the morning of surgery:[phone] pre- op desk                      For any other questions, please call [phone], Monday - Friday 8 AM - 4 PM. A.M.   Remember:  Do not eat or drink after midnight, Wednesday, January 6.    Take these medicines the morning of surgery with A SIP OF WATER:               amLODipine (NORVASC)                metoprolol tartrate (LOPRESSOR)               levothyroxine (SYNTHROID)                PARoxetine (PAXIL)  If Needed:  acetaminophen (TYLENOL) Follow Dr. Rolena Infante instructions regarding Xarelto.  STOP taking Aspirin, Aspirin Products (Goody Powder, Excedrin Migraine), Ibuprofen (Advil), Naproxen (Aleve), celecoxib (CELEBREX), Vitamins and Herbal Products (ie Fish Oil).                  [facility]- Preparing For Surgery  Before surgery, you can play an important role. Because skin is not sterile, your skin needs to be as free of germs as possible. You can reduce the number of germs on your skin by washing with CHG (chlorahexidine gluconate) Soap before surgery.  CHG is an antiseptic cleaner which kills germs and bonds with the skin to continue killing germs even after washing.    Oral Hygiene is also important to reduce your risk of infection.  Remember - BRUSH YOUR TEETH THE MORNING OF SURGERY WITH YOUR REGULAR TOOTHPASTE  Please do not use if you have an allergy to CHG or antibacterial soaps. If your skin becomes reddened/irritated stop using the CHG.  Do not shave (including legs and underarms) for at least 48 hours prior to first CHG shower. It is OK to shave your face.  Please follow these instructions carefully.   1. Shower the NIGHT BEFORE SURGERY and the MORNING OF SURGERY with CHG.   2. If you chose to wash your hair, wash your  hair first as usual with your normal shampoo.  3. After you shampoo, wash your face and private area with the soap you use at home, then rinse your hair and body thoroughly to remove the shampoo and soap.  4. Use CHG as you would any other liquid soap. You can apply CHG directly to the skin and wash gently with a scrungie or a clean washcloth.   5. Apply the CHG Soap to your body ONLY FROM THE NECK DOWN.  Do not use on open wounds or open sores. Avoid contact with your eyes, ears, mouth and genitals (private parts).   6. Wash thoroughly, paying special attention to the area where your surgery will be performed.  7. Thoroughly rinse your body with warm water from the neck down.  8. DO NOT shower/wash with your normal soap after using and rinsing off the CHG Soap.  9. Pat yourself dry with a CLEAN TOWEL.  10. Wear CLEAN PAJAMAS to bed the night before surgery, wear comfortable clothes the morning of surgery  11. Place CLEAN SHEETS on your  bed the night of your first shower and DO NOT SLEEP WITH PETS.  Day of Surgery: Shower as instructed above. Do not wear lotions, powders, or perfumes, or deodorant. Please wear clean clothes to the hospital/surgery center.   Remember to brush your teeth WITH YOUR REGULAR TOOTHPASTE.  Do not wear jewelry, make-up or nail polish.    Do not shave 48 hours prior to surgery.   Do not bring valuables to the hospital.  [facility] is not responsible for any belongings or valuables.  Contacts, dentures or bridgework may not be worn into surgery.  Leave your suitcase in the car.  After surgery it may be brought to your room.  For patients admitted to the hospital, discharge time will be determined by your treatment team.  Patients discharged the day of surgery will not be allowed to drive home.   Please read over the following fact sheets that you were given.

## 2019-12-31 ENCOUNTER — Other Ambulatory Visit (HOSPITAL_COMMUNITY)
Admission: RE | Admit: 2019-12-31 | Discharge: 2019-12-31 | Disposition: A | Discharge status: Home / Self Care | Payer: Medicare PPO | Source: Ambulatory Visit | Attending: Orthopedic Surgery | Admitting: Orthopedic Surgery

## 2019-12-31 ENCOUNTER — Ambulatory Visit (HOSPITAL_COMMUNITY)
Admission: RE | Admit: 2019-12-31 | Discharge: 2019-12-31 | Disposition: A | Discharge status: Home / Self Care | Payer: Medicare PPO | Source: Ambulatory Visit | Attending: Orthopedic Surgery | Admitting: Orthopedic Surgery

## 2019-12-31 ENCOUNTER — Encounter (HOSPITAL_COMMUNITY): Payer: Self-pay

## 2019-12-31 ENCOUNTER — Telehealth: Payer: Self-pay | Admitting: Adult Health

## 2019-12-31 ENCOUNTER — Other Ambulatory Visit: Payer: Self-pay | Admitting: Orthopedic Surgery

## 2019-12-31 ENCOUNTER — Other Ambulatory Visit: Payer: Self-pay

## 2019-12-31 ENCOUNTER — Encounter (HOSPITAL_COMMUNITY)
Admission: RE | Admit: 2019-12-31 | Discharge: 2019-12-31 | Disposition: A | Discharge status: Home / Self Care | Payer: Medicare PPO | Source: Ambulatory Visit | Attending: Orthopedic Surgery | Admitting: Orthopedic Surgery

## 2019-12-31 ENCOUNTER — Telehealth: Payer: Self-pay | Admitting: Cardiology

## 2019-12-31 ENCOUNTER — Ambulatory Visit: Payer: Self-pay | Admitting: Orthopedic Surgery

## 2019-12-31 DIAGNOSIS — E785 Hyperlipidemia, unspecified: Secondary | ICD-10-CM | POA: Diagnosis not present

## 2019-12-31 DIAGNOSIS — Z79890 Hormone replacement therapy (postmenopausal): Secondary | ICD-10-CM | POA: Insufficient documentation

## 2019-12-31 DIAGNOSIS — I2510 Atherosclerotic heart disease of native coronary artery without angina pectoris: Secondary | ICD-10-CM | POA: Insufficient documentation

## 2019-12-31 DIAGNOSIS — I700 Atherosclerosis of aorta: Secondary | ICD-10-CM | POA: Insufficient documentation

## 2019-12-31 DIAGNOSIS — N182 Chronic kidney disease, stage 2 (mild): Secondary | ICD-10-CM | POA: Diagnosis not present

## 2019-12-31 DIAGNOSIS — E876 Hypokalemia: Secondary | ICD-10-CM | POA: Diagnosis not present

## 2019-12-31 DIAGNOSIS — Z9012 Acquired absence of left breast and nipple: Secondary | ICD-10-CM | POA: Diagnosis not present

## 2019-12-31 DIAGNOSIS — I441 Atrioventricular block, second degree: Secondary | ICD-10-CM | POA: Insufficient documentation

## 2019-12-31 DIAGNOSIS — M1990 Unspecified osteoarthritis, unspecified site: Secondary | ICD-10-CM | POA: Diagnosis not present

## 2019-12-31 DIAGNOSIS — I480 Paroxysmal atrial fibrillation: Secondary | ICD-10-CM | POA: Insufficient documentation

## 2019-12-31 DIAGNOSIS — Z01818 Encounter for other preprocedural examination: Secondary | ICD-10-CM | POA: Diagnosis not present

## 2019-12-31 DIAGNOSIS — Z955 Presence of coronary angioplasty implant and graft: Secondary | ICD-10-CM | POA: Diagnosis not present

## 2019-12-31 DIAGNOSIS — J9811 Atelectasis: Secondary | ICD-10-CM | POA: Diagnosis not present

## 2019-12-31 DIAGNOSIS — Z7901 Long term (current) use of anticoagulants: Secondary | ICD-10-CM | POA: Insufficient documentation

## 2019-12-31 DIAGNOSIS — W19XXXA Unspecified fall, initial encounter: Secondary | ICD-10-CM | POA: Diagnosis not present

## 2019-12-31 DIAGNOSIS — I129 Hypertensive chronic kidney disease with stage 1 through stage 4 chronic kidney disease, or unspecified chronic kidney disease: Secondary | ICD-10-CM | POA: Diagnosis not present

## 2019-12-31 DIAGNOSIS — S32009A Unspecified fracture of unspecified lumbar vertebra, initial encounter for closed fracture: Secondary | ICD-10-CM | POA: Insufficient documentation

## 2019-12-31 DIAGNOSIS — Z9049 Acquired absence of other specified parts of digestive tract: Secondary | ICD-10-CM | POA: Diagnosis not present

## 2019-12-31 DIAGNOSIS — K219 Gastro-esophageal reflux disease without esophagitis: Secondary | ICD-10-CM | POA: Insufficient documentation

## 2019-12-31 DIAGNOSIS — Z20822 Contact with and (suspected) exposure to covid-19: Secondary | ICD-10-CM | POA: Diagnosis not present

## 2019-12-31 DIAGNOSIS — M8008XA Age-related osteoporosis with current pathological fracture, vertebra(e), initial encounter for fracture: Secondary | ICD-10-CM | POA: Diagnosis not present

## 2019-12-31 DIAGNOSIS — I10 Essential (primary) hypertension: Secondary | ICD-10-CM

## 2019-12-31 DIAGNOSIS — F329 Major depressive disorder, single episode, unspecified: Secondary | ICD-10-CM | POA: Insufficient documentation

## 2019-12-31 DIAGNOSIS — Z9841 Cataract extraction status, right eye: Secondary | ICD-10-CM | POA: Insufficient documentation

## 2019-12-31 DIAGNOSIS — Z79899 Other long term (current) drug therapy: Secondary | ICD-10-CM | POA: Insufficient documentation

## 2019-12-31 DIAGNOSIS — E039 Hypothyroidism, unspecified: Secondary | ICD-10-CM | POA: Insufficient documentation

## 2019-12-31 DIAGNOSIS — Z9842 Cataract extraction status, left eye: Secondary | ICD-10-CM | POA: Insufficient documentation

## 2019-12-31 DIAGNOSIS — Z87891 Personal history of nicotine dependence: Secondary | ICD-10-CM | POA: Insufficient documentation

## 2019-12-31 DIAGNOSIS — Z791 Long term (current) use of non-steroidal anti-inflammatories (NSAID): Secondary | ICD-10-CM | POA: Diagnosis not present

## 2019-12-31 DIAGNOSIS — I517 Cardiomegaly: Secondary | ICD-10-CM | POA: Insufficient documentation

## 2019-12-31 DIAGNOSIS — N189 Chronic kidney disease, unspecified: Secondary | ICD-10-CM | POA: Diagnosis not present

## 2019-12-31 DIAGNOSIS — M4856XA Collapsed vertebra, not elsewhere classified, lumbar region, initial encounter for fracture: Secondary | ICD-10-CM | POA: Diagnosis not present

## 2019-12-31 HISTORY — DX: Hypothyroidism, unspecified: E03.9

## 2019-12-31 HISTORY — DX: Gastro-esophageal reflux disease without esophagitis: K21.9

## 2019-12-31 LAB — BASIC METABOLIC PANEL
Anion gap: 14 (ref 5–15)
BUN: 16 mg/dL (ref 8–23)
CO2: 25 mmol/L (ref 22–32)
Calcium: 9.5 mg/dL (ref 8.9–10.3)
Chloride: 100 mmol/L (ref 98–111)
Creatinine, Ser: 1.19 mg/dL — ABNORMAL HIGH (ref 0.44–1.00)
GFR calc Af Amer: 47 mL/min — ABNORMAL LOW (ref >60)
GFR calc non Af Amer: 41 mL/min — ABNORMAL LOW (ref >60)
Glucose, Bld: 120 mg/dL — ABNORMAL HIGH (ref 70–99)
Potassium: 3 mmol/L — ABNORMAL LOW (ref 3.5–5.1)
Sodium: 139 mmol/L (ref 135–145)

## 2019-12-31 LAB — URINALYSIS, ROUTINE W REFLEX MICROSCOPIC
Glucose, UA: NEGATIVE mg/dL
Ketones, ur: 15 mg/dL — AB
Nitrite: NEGATIVE
Protein, ur: 30 mg/dL — AB
Specific Gravity, Urine: <1.005 — ABNORMAL LOW (ref 1.005–1.030)
pH: 6 (ref 5.0–8.0)

## 2019-12-31 LAB — HEPATIC FUNCTION PANEL
ALT: 15 U/L (ref 0–44)
AST: 54 U/L — ABNORMAL HIGH (ref 15–41)
Albumin: 3.6 g/dL (ref 3.5–5.0)
Alkaline Phosphatase: 87 U/L (ref 38–126)
Bilirubin, Direct: 0.3 mg/dL — ABNORMAL HIGH (ref 0.0–0.2)
Indirect Bilirubin: 1 mg/dL — ABNORMAL HIGH (ref 0.3–0.9)
Total Bilirubin: 1.3 mg/dL — ABNORMAL HIGH (ref 0.3–1.2)
Total Protein: 7.5 g/dL (ref 6.5–8.1)

## 2019-12-31 LAB — SURGICAL PCR SCREEN
MRSA, PCR: NEGATIVE
Staphylococcus aureus: NEGATIVE

## 2019-12-31 LAB — CBC
HCT: 47.5 % — ABNORMAL HIGH (ref 36.0–46.0)
Hemoglobin: 15.9 g/dL — ABNORMAL HIGH (ref 12.0–15.0)
MCH: 33.3 pg (ref 26.0–34.0)
MCHC: 33.5 g/dL (ref 30.0–36.0)
MCV: 99.4 fL (ref 80.0–100.0)
Platelets: 275 10*3/uL (ref 150–400)
RBC: 4.78 MIL/uL (ref 3.87–5.11)
RDW: 13.4 % (ref 11.5–15.5)
WBC: 8.4 10*3/uL (ref 4.0–10.5)
nRBC: 0 % (ref 0.0–0.2)

## 2019-12-31 LAB — APTT: aPTT: 35 seconds (ref 24–36)

## 2019-12-31 LAB — URINALYSIS, MICROSCOPIC (REFLEX)

## 2019-12-31 LAB — PROTIME-INR
INR: 2 — ABNORMAL HIGH (ref 0.8–1.2)
Prothrombin Time: 23 seconds — ABNORMAL HIGH (ref 11.4–15.2)

## 2019-12-31 MED ORDER — SPIRONOLACTONE 25 MG PO TABS: 12.5000 mg | ORAL_TABLET | Freq: Every day | ORAL | 3 refills | Status: DC

## 2019-12-31 NOTE — Telephone Encounter (Signed)
Amanda Ward from [place] called in regards to patient and would like a call back at the number listed below.  [phone]

## 2019-12-31 NOTE — H&P (Signed)
Subjective:    Christina Ortiz is a pleasant 86 year old Female with Past medical history significant for alcohol use disorder (on thiamine) with her Normal state of health until unfortunately she had a fall In September. Despite conservative treatment measures including time, medications patient continues to have significant pain that is affecting her quality-of-life and therefore she would like to move forward with surgical intervention. She is scheduled for L2 Kyphoplasty on 01/03/2020 at [facility]  Patient Active Problem List   Diagnosis Date Noted  . Surgical counseling visit 12/10/2019  . History of esophageal stricture 11/02/2019  . Muscular deconditioning 11/01/2019  . Elevated LFTs   . Choledocholithiasis 10/16/2019  . Abnormal urinalysis 09/28/2019  . Adult failure to thrive 09/28/2019  . Dysphagia 09/06/2018  . Pain   . Closed displaced fracture of greater trochanter of left femur (HCC) 06/04/2018  . Hypoxia 06/03/2018  . Fall 03/31/2018  . Humerus shaft fracture-right 03/31/2018  . Rhabdomyolysis 03/31/2018  . Hypothermia 03/31/2018  . Healthcare maintenance 01/17/2018  . Nocturia 01/17/2018  . Macrocytosis 11/22/2017  . Acute bronchitis due to respiratory syncytial virus (RSV) 11/09/2017  . Acute respiratory failure with hypoxia (HCC) 11/09/2017  . Hypertension 11/09/2017  . PAF (paroxysmal atrial fibrillation) (HCC) 11/09/2017  . Coronary artery disease 11/09/2017  . CKD (chronic kidney disease) stage 2, GFR 60-89 ml/min 11/09/2017  . Hypothyroidism 11/09/2017  . Dyspnea 11/08/2017  . Upper respiratory tract infection 11/08/2017  . Elevated serum creatinine 06/21/2017  . Edema of both ankles 06/21/2017  . Diarrhea 05/25/2017  . Hypokalemia 05/25/2017  . Alcohol abuse 05/25/2017  . Depression, recurrent (HCC) 03/30/2017  . Lumbago 03/30/2017  . Influenza with respiratory manifestation 01/22/2017  . Coronary artery disease due to lipid rich plaque 04/04/2015  . Essential  hypertension 04/04/2015  . Hyperlipidemia 04/04/2015  . Former smoker 04/04/2015  . Chronic anticoagulation 04/04/2015   Past Medical History:  Diagnosis Date  . Acute bronchitis 10/2017  . Arthritis   . CKD (chronic kidney disease)    Xarelto dose is 15 mg QD  . Coronary artery disease    a. s/p PCI/stenting to RCA and LAD ([facility] in [place]) in 2006; b. Nuc 1/15: No ischemia, EF 70%  . Depression   . Gallstones   . Gastritis   . GERD (gastroesophageal reflux disease)   . Hiatal hernia   . Hip fracture (HCC)   . History of cholecystectomy   . History of echocardiogram    a. Echo 1/15: Moderate LVH, EF 55-60%, impaired relaxation, mild AS  . Hyperlipidemia   . Hypertension   . Hypokalemia   . Hypothyroidism   . Mobitz type 1 second degree atrioventricular block    a. Event Monitor 3/15: NSR, first-degree AV block, second-degree AB block - Mobitz 1, PACs, NSVT (5 beats)  . PAF (paroxysmal atrial fibrillation) (HCC)   . Thyroid disease     Past Surgical History:  Procedure Laterality Date  . ANGIOPLASTY     with sent placement  . BILIARY DILATION  10/18/2019   Procedure: BILIARY DILATION;  Surgeon: Rachael Fee, MD;  Location: [facility];  Service: Endoscopy;;  . BILIARY DILATION  12/17/2019   Procedure: BILIARY DILATION;  Surgeon: [name], MD;  Location: [facility];  Service: Endoscopy;;  . BILIARY STENT PLACEMENT N/A 10/18/2019   Procedure: BILIARY STENT PLACEMENT;  Surgeon: Rachael Fee, MD;  Location: [facility];  Service: Endoscopy;  Laterality: N/A;  . BILIARY STENT PLACEMENT N/A 12/17/2019   Procedure:  BILIARY STENT PLACEMENT;  Surgeon: Ladene Artist, MD;  Location: [facility];  Service: Endoscopy;  Laterality: N/A;  . BLEPHAROPLASTY    . BREAST LUMPECTOMY Left   . CARDIAC CATHETERIZATION    . CATARACT EXTRACTION Bilateral   . CHOLECYSTECTOMY    . DILATION AND CURETTAGE OF UTERUS    . ENDOSCOPIC RETROGRADE  CHOLANGIOPANCREATOGRAPHY (ERCP) WITH PROPOFOL N/A 12/17/2019   Procedure: ENDOSCOPIC RETROGRADE CHOLANGIOPANCREATOGRAPHY (ERCP) WITH PROPOFOL;  Surgeon: Ladene Artist, MD;  Location: [facility];  Service: Endoscopy;  Laterality: N/A;  . ERCP N/A 10/18/2019   Procedure: ENDOSCOPIC RETROGRADE CHOLANGIOPANCREATOGRAPHY (ERCP);  Surgeon: Milus Banister, MD;  Location: [facility];  Service: Endoscopy;  Laterality: N/A;  . ESOPHAGOGASTRODUODENOSCOPY (EGD) WITH PROPOFOL N/A 12/17/2019   Procedure: ESOPHAGOGASTRODUODENOSCOPY (EGD) WITH PROPOFOL;  Surgeon: Ladene Artist, MD;  Location: [facility];  Service: Endoscopy;  Laterality: N/A;  . REMOVAL OF STONES  10/18/2019   Procedure: REMOVAL OF STONES;  Surgeon: Milus Banister, MD;  Location: [facility];  Service: Endoscopy;;  . REMOVAL OF STONES  12/17/2019   Procedure: REMOVAL OF STONES;  Surgeon: Ladene Artist, MD;  Location: [facility];  Service: Endoscopy;;  . Azzie Almas DILATION N/A 12/17/2019   Procedure: SAVORY DILATION;  Surgeon: Ladene Artist, MD;  Location: [facility];  Service: Endoscopy;  Laterality: N/A;  . SPHINCTEROTOMY  10/18/2019   Procedure: SPHINCTEROTOMY;  Surgeon: Milus Banister, MD;  Location: [facility];  Service: Endoscopy;;  . Joan Mayans  12/17/2019   Procedure: Joan Mayans;  Surgeon: Ladene Artist, MD;  Location: [facility];  Service: Endoscopy;;  . STENT REMOVAL  12/17/2019   Procedure: STENT REMOVAL;  Surgeon: Ladene Artist, MD;  Location: [facility];  Service: Endoscopy;;    Current Outpatient Medications  Medication Sig Dispense Refill Last Dose  . acetaminophen (TYLENOL) 325 MG tablet Take 650 mg by mouth every 6 (six) hours as needed for moderate pain.      Marland Kitchen amLODipine (NORVASC) 5 MG tablet Take 1 tablet (5 mg total) by mouth daily. 90 tablet 3   . antiseptic oral rinse (BIOTENE) LIQD 15 mLs by Mouth Rinse route daily as needed for dry mouth.      . celecoxib (CELEBREX) 200 MG  capsule Take 200 mg by mouth 2 (two) times daily.      . cholecalciferol (VITAMIN D) 1000 units tablet Take 1,000 Units by mouth every evening.     . folic acid (FOLVITE) 1 MG tablet TAKE 1 TABLET BY MOUTH DAILY (Patient taking differently: Take 1 mg by mouth daily. ) 90 tablet 3   . furosemide (LASIX) 20 MG tablet TAKE 1 TABLET BY MOUTH DAILY. 30 tablet 6   . levothyroxine (SYNTHROID) 75 MCG tablet TAKE 1 TABLET BY MOUTH DAILY BEFORE BREAKFAST. (Patient taking differently: Take 75 mcg by mouth daily before breakfast. ) 90 tablet 0   . magnesium oxide (MAG-OX) 400 MG tablet Take 400 mg by mouth daily.     . metoprolol tartrate (LOPRESSOR) 25 MG tablet TAKE 1/2 TABLET BY MOUTH TWICE A DAY. (Patient taking differently: Take 12.5 mg by mouth 2 (two) times daily. ) 90 tablet 0   . multivitamin (PROSIGHT) TABS tablet Take 1 tablet by mouth daily. 30 each 0   . omeprazole (PRILOSEC) 20 MG capsule TAKE 1 CAPSULE BY MOUTH DAILY AS NEEDED FOR HEARTBURN. (Patient taking differently: Take 20 mg by mouth daily. ) 90 capsule 3   . oxybutynin (DITROPAN-XL) 10 MG 24 hr tablet  Take 10 mg by mouth at bedtime.      Marland Kitchen PARoxetine (PAXIL) 40 MG tablet Take 1 tablet (40 mg total) by mouth daily. 90 tablet 1   . polyethylene glycol (MIRALAX / GLYCOLAX) packet Take 17 g by mouth daily.      . potassium chloride SA (K-DUR) 20 MEQ tablet Take 1 tablet (20 mEq total) by mouth daily. 90 tablet 3   . Propylene Glycol (SYSTANE BALANCE) 0.6 % SOLN Place 1 drop into both eyes daily.     . quinapril (ACCUPRIL) 40 MG tablet TAKE 1 TABLET (40 MG TOTAL) BY MOUTH DAILY. 90 tablet 2   . simvastatin (ZOCOR) 5 MG tablet TAKE 1 TABLET BY MOUTH EVERY EVENING. (Patient taking differently: Take 5 mg by mouth at bedtime. ) 90 tablet 0   . tolterodine (DETROL LA) 4 MG 24 hr capsule Take 4 mg by mouth daily.      [name] 15 MG TABS tablet TAKE 1 TABLET (15 MG TOTAL) BY MOUTH DAILY WITH SUPPER. (Patient taking differently: Take 15 mg by mouth  daily with supper. ) 90 tablet 1    No current facility-administered medications for this visit.   Allergies  Allergen Reactions  . Tape Other (See Comments)    Patient's skin is thin and will TEAR AND BRUISE EASILY    Social History   Tobacco Use  . Smoking status: Former Smoker    Packs/day: 0.50    Years: 67.00    Pack years: 33.50    Types: Cigarettes    Quit date: 10/24/2014    Years since quitting: 5.1  . Smokeless tobacco: Never Used  . Tobacco comment: quit 2015  Substance Use Topics  . Alcohol use: Yes    Alcohol/week: 8.0 standard drinks    Types: 8 Shots of liquor per week    Family History  Problem Relation Age of Onset  . Heart disease Mother   . Heart attack Mother   . Lung cancer Father   . Lymphoma Brother   . Heart failure Brother   . Esophageal cancer Paternal Uncle   . Colon cancer Neg Hx   . Colon polyps Neg Hx   . Rectal cancer Neg Hx   . Stomach cancer Neg Hx     Review of Systems As stated in HPI  Objective:   General: AAOX3, well developed and well nourished, NAD  Ambulation: abnormal gait pattern (slow, but normal given patients age), uses no assistive device.  Inspection: No obvious deformity, scoleosis, kyphosis, loss of lordotic curve.  Palpation: Non-tender over spinous processes Including approximately L2.  AROM: - Knee: flexion and extension normal and pain free bilaterally. - Ankle: Dorsiflexion, plantarflexion, inversion, eversion normal and pain free.  Dermatomes: Lower extremity sensation to light touch intact bilaterally  Myotomes: - Hip Flexion: Left 5/5, Right 5/5 - Knee Extension: Left 5/5, Right 5/5 - Knee Flexion: Left 5/5, Right 5/5 - Ankle Dorsiflextion: Left 5/5, Right 5/5 - Ankle Plantarflexion: Left 5/5, Right 5/5  Reflexes: - Patella: Left2+, Right 2+ - Achilles: Left2+, Right 2+ - Babinski: Left Ngative, Right Negative - Clonus: Negative   PV: Extremities warm and well profused. Posterior and  dorsalis pedis pulse 2+ bilaterally.  MRI Impression: MRI of the lumbar spine dated 09/08/2019 shows a L2 benign acute fracture with minimal anterior loss of plate with vertical and transverse fracture lines visible. No retropulsion. She has other significant degenerative changes throughout the lumbar spine.  X-ray: Repete AP and lateral  lumbar spine x-rays were taken at today's visit and the images were personally reviewed by me. There has been some increase of collapse of the L2 vertebrae When compared to previous images, but is not class enough that it would interfere with her ability to augment this level via kyphoplasty. No new compression deformity is noted. Positive scoliosis.  Assessment:   Thy is a pleasant 86 year old Female with Past medical history significant for alcohol use disorder (on thiamine) with her Normal state of health until unfortunately she had a fall In September. She has a compression deformity at L2 and despite conservative treatment measures the pain is severe and interfering with her quality-of-life and she would like to move forward with surgical intervention. She is scheduled for an L2 kyphoplasty on 01/03/2020.  Plan:    L2 kyphoplasty   Risks of surgery include: Infection, bleeding, death, stroke, paralysis, nerve damage, leak of cement, need for additional surgery including open decompression. Ongoing or worse pain.   Goals of surgery: Reduction in pain, and improvement in quality of life   We did obtain preoperative clearance by her cardiologist who has recommended holding Xarelto 3 days prior to surgical intervention. The patient and her daughter expressed understanding of this. She has not taken Xarelto today and she will not take her dose later tonight nor in the other days leading up to surgery. We will likely restart this 2 days postoperatively. Patient is not on any aspirin or taking any other anti-inflammatory medications.   We also obtained  preoperative clearance from the patient's primary care provider who said from a primary care standpoint she is cleared, they did recommend cardiologist clearance which we have. Also recommend a CMP. Previously, a BMP was ordered, but I did contact the lab, as she does have lab work done this morning and had been given out on to make this a CMP   We have also discussed the post-operative recovery period to include: bathing/showering restrictions, wound healing, activity (and driving) restrictions, medications/pain mangement.   We have also discussed post-operative redflags to include: signs and symptoms of postoperative infection, DVT/PE.   Patient and her daughter who is with her at today's visit expressed understanding of the surgery, risks, postoperative period. All of the questions were invited and answered.  Plan is to move forward with procedure pending labs performed at [facility].   Follow-up: 2 weeks postoperatively

## 2019-12-31 NOTE — Telephone Encounter (Signed)
Discussed labs/concerns with Marchelle Folks [facility]

## 2019-12-31 NOTE — Anesthesia Preprocedure Evaluation (Deleted)
Anesthesia Evaluation    Airway        Dental   Pulmonary former smoker,           Cardiovascular hypertension,      Neuro/Psych    GI/Hepatic   Endo/Other    Renal/GU      Musculoskeletal   Abdominal   Peds  Hematology   Anesthesia Other Findings   Reproductive/Obstetrics                                                              Anesthesia Evaluation  Patient identified by MRN, date of birth, ID band Patient awake    Reviewed: Allergy & Precautions, NPO status , Patient's Chart, lab work & pertinent test results  Airway Mallampati: III  TM Distance: >3 FB Neck ROM: Full    Dental  (+) Teeth Intact, Dental Advisory Given   Pulmonary former smoker,    breath sounds clear to auscultation       Cardiovascular hypertension, Pt. on medications and Pt. on home beta blockers + CAD and + Cardiac Stents  + dysrhythmias Atrial Fibrillation  Rhythm:Irregular Rate:Normal     Neuro/Psych PSYCHIATRIC DISORDERS Depression  Neuromuscular disease    GI/Hepatic Neg liver ROS, hiatal hernia,   Endo/Other  Hypothyroidism   Renal/GU   negative genitourinary   Musculoskeletal  (+) Arthritis ,   Abdominal Normal abdominal exam  (+)   Peds  Hematology negative hematology ROS (+)   Anesthesia Other Findings   Reproductive/Obstetrics                            Anesthesia Physical Anesthesia Plan  ASA: III  Anesthesia Plan: General   Post-op Pain Management:    Induction: Intravenous  PONV Risk Score and Plan: 3 and Ondansetron  Airway Management Planned: Oral ETT  Additional Equipment: None  Intra-op Plan:   Post-operative Plan: Extubation in OR  Informed Consent:   Plan Discussed with: CRNA  Anesthesia Plan Comments:         Anesthesia Quick Evaluation  Anesthesia Physical Anesthesia Plan  ASA:   Anesthesia Plan:     Post-op Pain Management:    Induction:   PONV Risk Score and Plan:   Airway Management Planned:   Additional Equipment:   Intra-op Plan:   Post-operative Plan:   Informed Consent:   Plan Discussed with:   Anesthesia Plan Comments: (See PAT note written 12/31/2019 by Shonna Chock, PA-C. STAT PT/INR on arrival for surgery. )        Anesthesia Quick Evaluation

## 2019-12-31 NOTE — Telephone Encounter (Signed)
New message  Physician's assist Marchelle Folks from [facility] is calling in to discuss concerning preop labs with Dr. Anne Fu. Transfer to Dr. Anne Fu.

## 2019-12-31 NOTE — Progress Notes (Signed)
PCP - Ayesha Mohair, NP  Cardiologist - Dr Anne Fu  Chest x-ray - 12/30/2018- results pending  EKG - 10/17/2019  Stress Test - 2015  ECHO - 08/16/2019  Cardiac Cath - no  Sleep Study - no CPAP - no  LABS- CBC, BMP, PT, PTT, UA  ASA- Noel Christmas- Christina Ortiz reports that she was not told to hold mediation.  I left a voice message for Christina Ortiz, Dr. Shon Baton scheduler; Christina Ortiz called me back and reported that patient was instructed to hold Xarelto 3 days prior to surgery and that she called Christina Ortiz and asked he rto have Christina Ortiz to stop Xarelto now.  ERAS-no  HA1C-na Fasting Blood Sugar - na Checks Blood Sugar __0___ times a day  Anesthesia-  Pt denies having chest pain, sob, or fever at this time. All instructions explained to the pt, with a verbal understanding of the material. Pt agrees to go over the instructions while at home for a better understanding. Pt also instructed to self quarantine after being tested for COVID-19. The opportunity to ask questions was provided.

## 2019-12-31 NOTE — Progress Notes (Signed)
Anesthesia Chart Review:  Case: [id number] Date/Time: 01/03/20 0715   Procedure: KYPHOPLASTY L2 (N/A )   Anesthesia type: Choice   Pre-op diagnosis: L2 Compression fracture   [facility]: [facility] ROOM 03 / [facility]   Surgeons: Venita Lick, MD      DISCUSSION: Patient is an 86 year old female scheduled for the above procedure.   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 Reportedly drinking 8 shots of liquor/week.  Preoperative cardiology input outline by Joni Reining, NP on 11/19/19:  "Given past medical history and time since last visit, based on ACC/AHA guidelines, Christina Ortiz would be at acceptable risk for the planned procedure without further cardiovascular testing.   Followed by Dr. Anne Fu, last seen in August of 2020.  Pharmacy has made recommendations to hold Xarelto for 3 days prior to procedure." Last dose 12/30/19.  Seen by PCP William Hamburger, NP on 12/10/19 for preoperative evaluation. Sherry at Dr. Shon Baton office reports medical clearance received, and she will fax over letter. I also notified Cordelia Pen of UA results and INR of 2.0. (Patient is not on warfarin, but is on Xarelto, last dose 12/30/19. She does drink alcohol regularly. Platelet count WNL. PTT WNL. LFTs mildly elevated with AST 54 and total bilirubin 1.3. Last EGD/ERCP 12/17/19 as discussed above.)   12/31/19 presurgical COVID-19 test in process. Anesthesia team to evaluate  on the day of surgery and follow-up on repeat PT/INR results.     VS: BP (!) 149/85   Pulse 87   Temp 36.5 C (Oral)   Resp 20   Ht 5' (1.524 m)   Wt 62.3 kg   SpO2 98%   BMI 26.80 kg/m    PROVIDERS: Danford, Jinny Blossom, NP is PCP.   Donato Schultz, MD is cardiologist. Established in April 2016. Last visit 08/14/19 was having LE edema, felt likely related to amlodipine. Echo ordered and showed normal LVEF, diastolic pseudonormalization, mild MR, mild-moderate TR.  Claudette Head, MD is GI   LABS: Preoperative labs include: Lab Results  Component Value Date   WBC 8.4 12/31/2019   HGB 15.9 (H) 12/31/2019   HCT 47.5 (H) 12/31/2019   PLT 275 12/31/2019   GLUCOSE 120 (H) 12/31/2019   ALT 15 12/31/2019   AST 54 (H) 12/31/2019   NA 139 12/31/2019   K 3.0 (L) 12/31/2019   CL 100 12/31/2019   CREATININE 1.19 (H) 12/31/2019   BUN 16 12/31/2019   CO2 25 12/31/2019   TSH 1.270 07/05/2019   INR 2.0 (H) 12/31/2019   HGBA1C 5.5 09/27/2019  AST 54, ALT 15, total bilirubin 1.3 (previously AST 24, ALT 4, total bili 0.9 on 12/14/19)  UA showed large leukocytes, negative nitrites, cloudy, rare bacteria, 0-5 WBC. INR 2.0, so will need repeat PT/INR on the day of surgery. See DISCUSSION.     Spirometry 05/05/18: FVC 1.31 (59%), post 1.46 (66%). FEV1 1.15 (71%), post 1.18 (73%).    IMAGES: CXR 12/31/19: FINDINGS: Cardiac enlargement unchanged. Negative for heart failure. Mild bibasilar airspace disease with interval improvement. Probable atelectasis in the  bases. No significant pleural effusion. Large hiatal hernia projecting to the right. Atherosclerotic aorta IMPRESSION: Cardiac enlargement without heart failure. Mild bibasilar atelectasis with interval improvement. Aortic Atherosclerosis (ICD10-I70.0).   EKG: 10/16/19: Afib at 82 bpm. Left posterior fascicular block. Borderline T wave abnormalities, anterior leads. Borderline prolonged QT interval. QT 423, QTc 495 ms. (12/17/19 EGD  anesthesia records indicates underlying rhythm of afib. 12/12/18 showed SR with first degree AVB, PACs, left posterior fascicular block. EKG 06/03/18 showed afib.)   CV: Echo 08/22/19: IMPRESSIONS  1. The left ventricle has normal systolic function, with an ejection fraction of 55-60%. The cavity size was normal. Left ventricular diastolic Doppler parameters are consistent with pseudonormalization.  2. The right ventricle has normal systolic function. The cavity was normal. There is no increase in right ventricular wall thickness.  3. Left atrial size was severely dilated.  4. Right atrial size was mildly dilated.  5. Mild mitral valve prolapse.  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 Mild thickening of the mitral valve leaflet. There is mild to moderate mitral annular calcification present. Mild mitral regurgitation. There is mild prolapse of both mitral leaflets.  7. Tricuspid valve regurgitation is mild-moderate.  8. The aortic valve is grossly normal. Mild thickening of the aortic valve. Mild calcification of the aortic valve. No stenosis of the aortic valve.  9. The aorta is abnormal unless otherwise noted. 10. There is mild to moderate dilatation of the aortic root and of the ascending aorta. Ao Root 3.20 cm. Ao Ascending 3.90 cm. 11. The atrial septum is grossly normal.   Event monitor 02/23/14-03/08/14 [facility]): Impression: Baseline rhythm: Sinus rhythm First-degree AV block and second-degree AV block Mobitz type I Rare PACs Nonsustained ventricular tachycardia with monomorphic morphology lasting 5 beats with heart rate of 158 bpm.   Past Medical History:  Diagnosis Date  . Acute bronchitis 10/2017  . Arthritis   . CKD (chronic kidney disease)    Xarelto dose is 15 mg QD  . Coronary artery disease    a. s/p PCI/stenting to RCA and LAD ([facility] in [place]) in 2006; b. Nuc 1/15: No ischemia, EF 70%  . Depression   . Gallstones   . Gastritis   . GERD  (gastroesophageal reflux disease)   . Hiatal hernia   . Hip fracture (HCC)   . History of cholecystectomy   . History of echocardiogram    a. Echo 1/15: Moderate LVH, EF 55-60%, impaired relaxation, mild AS  . Hyperlipidemia   . Hypertension   . Hypokalemia   . Hypothyroidism   . Mobitz type 1 second degree atrioventricular block    a. Event Monitor 3/15: NSR, first-degree AV block, second-degree AB block - Mobitz 1, PACs, NSVT (5 beats)  . PAF (paroxysmal atrial fibrillation) (HCC)   . Thyroid disease     Past Surgical History:  Procedure Laterality Date  . ANGIOPLASTY     with sent placement  . BILIARY DILATION  10/18/2019   Procedure: BILIARY DILATION;  Surgeon: Rachael Fee, MD;  Location: [facility];  Service: Endoscopy;;  . BILIARY DILATION  12/17/2019   Procedure: BILIARY DILATION;  Surgeon: [name], MD;  Location: [facility];  Service: Endoscopy;;  . BILIARY STENT PLACEMENT N/A 10/18/2019   Procedure: BILIARY STENT PLACEMENT;  Surgeon: Rachael Fee, MD;  Location: [facility];  Service: Endoscopy;  Laterality: N/A;  . BILIARY STENT PLACEMENT N/A 12/17/2019   Procedure: BILIARY STENT PLACEMENT;  Surgeon: [name], MD;  Location: [facility];  Service: Endoscopy;  Laterality: N/A;  . BLEPHAROPLASTY    . BREAST LUMPECTOMY Left   . CARDIAC CATHETERIZATION    . CATARACT EXTRACTION Bilateral   . CHOLECYSTECTOMY    . DILATION AND CURETTAGE OF UTERUS    . ENDOSCOPIC RETROGRADE CHOLANGIOPANCREATOGRAPHY (ERCP) WITH PROPOFOL N/A 12/17/2019   Procedure: ENDOSCOPIC RETROGRADE CHOLANGIOPANCREATOGRAPHY (ERCP) WITH PROPOFOL;  Surgeon: Ladene Artist, MD;  Location: [facility];  Service: Endoscopy;  Laterality: N/A;  . ERCP N/A 10/18/2019   Procedure: ENDOSCOPIC RETROGRADE CHOLANGIOPANCREATOGRAPHY (ERCP);  Surgeon: Milus Banister, MD;  Location: [facility];  Service: Endoscopy;  Laterality: N/A;  . ESOPHAGOGASTRODUODENOSCOPY (EGD) WITH PROPOFOL N/A  12/17/2019   Procedure: ESOPHAGOGASTRODUODENOSCOPY (EGD) WITH PROPOFOL;  Surgeon: Ladene Artist, MD;  Location: [facility];  Service: Endoscopy;  Laterality: N/A;  . REMOVAL OF STONES  10/18/2019   Procedure: REMOVAL OF STONES;  Surgeon: Milus Banister, MD;  Location: [facility];  Service: Endoscopy;;  . REMOVAL OF STONES  12/17/2019   Procedure: REMOVAL OF STONES;  Surgeon: Ladene Artist, MD;  Location: [facility];  Service: Endoscopy;;  . Azzie Almas DILATION N/A 12/17/2019   Procedure: SAVORY DILATION;  Surgeon: Ladene Artist, MD;  Location: [facility];  Service: Endoscopy;  Laterality: N/A;  . SPHINCTEROTOMY  10/18/2019   Procedure: SPHINCTEROTOMY;  Surgeon: Milus Banister, MD;  Location: [facility];  Service: Endoscopy;;  . Joan Mayans  12/17/2019   Procedure: Joan Mayans;  Surgeon: Ladene Artist, MD;  Location: [facility];  Service: Endoscopy;;  . STENT REMOVAL  12/17/2019   Procedure: STENT REMOVAL;  Surgeon: Ladene Artist, MD;  Location: [facility];  Service: Endoscopy;;    MEDICATIONS: . acetaminophen (TYLENOL) 325 MG tablet  . amLODipine (NORVASC) 5 MG tablet  . antiseptic oral rinse (BIOTENE) LIQD  . celecoxib (CELEBREX) 200 MG capsule  . cholecalciferol (VITAMIN D) 1000 units tablet  . folic acid (FOLVITE) 1 MG tablet  . furosemide (LASIX) 20 MG tablet  . levothyroxine (SYNTHROID) 75 MCG tablet  . magnesium oxide (MAG-OX) 400 MG tablet  . metoprolol tartrate (LOPRESSOR) 25 MG tablet  . multivitamin (PROSIGHT) TABS tablet  . omeprazole (PRILOSEC) 20 MG capsule  . oxybutynin (DITROPAN-XL) 10 MG 24 hr tablet  . PARoxetine (PAXIL) 40 MG tablet  . polyethylene glycol (MIRALAX / GLYCOLAX) packet  . potassium chloride SA (K-DUR) 20 MEQ tablet  . Propylene Glycol (SYSTANE BALANCE) 0.6 % SOLN  . quinapril (ACCUPRIL) 40 MG tablet  . simvastatin (ZOCOR) 5 MG tablet  . tolterodine (DETROL LA) 4 MG 24 hr capsule  . XARELTO 15 MG TABS tablet   No  current facility-administered medications for this encounter.    Myra Gianotti, PA-C Surgical Short Stay/Anesthesiology [facility] Phone [phone] [facility] Phone [phone] 12/31/2019 4:08 PM

## 2019-12-31 NOTE — Telephone Encounter (Signed)
Spoke with Christina Ortiz ([phone] cell number).  Pt's pre-op lab demonstrate a K+ 3.0, Crea 1.19, BUN 16.  Reviewed this information with Dr Anne Fu.  He gave orders for pt to d/c her Furosemide, start Spironolactone 12.5 mg daily and continue potassium chl 20 MEQ daily.  Repeat BMP 1 week.  Attempted to contact pt or daughter [name] at # listed.  LM on Carla's VM.  Called and spoke with Carla's husband who reports she has gone to the pharmacy for the patient.  Attempted to call again on cell phone - NA.  Attempted to contact on cell phone again - NA.  RX for spironolactone sent into pharmacy listed.    Spoke with Marchelle Folks who is aware or Dr Anne Fu' orders.  Pt also has a UTI and Marchelle Folks reports [name] was going to the pharmacy to pick up an antibiotic.  Marchelle Folks has ordered for pt to have repeat lab on Thursday prior to surgery.    Was able to contact and speak with [name] (pt's daughter)  She reports she had picked up the antibiotic from the pharmacy and taken it to her mother.  Marchelle Folks had reviewed the lab results with her and therefore she was aware of K+ being 3.0.  [name] states she instructed pt to increase potassium to 2 tablets daily and she had taken an extra one tonight.  Vernice Jefferson of Dr Anne Fu orders including to continue Potassium 20 MEQ once daily, stop Furosemide and start Spironolactone 25 mg 1/2 tablet daily.  [name] will notify the patient and pick up the Spironolactone tomorrow.  She was appreciative of the persistance to contact her regarding these instructions.

## 2020-01-01 ENCOUNTER — Telehealth: Payer: Self-pay

## 2020-01-01 LAB — NOVEL CORONAVIRUS, NAA (HOSP ORDER, SEND-OUT TO REF LAB; TAT 18-24 HRS): SARS-CoV-2, NAA: NOT DETECTED

## 2020-01-01 NOTE — Telephone Encounter (Signed)
-----   Message from [name], MD sent at 01/01/2020  4:14 PM EST ----- Liz Beach, I have used Spyglass a few times and I'd like to become more proficient with Spyglass. I would like to learn to perform EHL.  Lakendrick Paradis,  Please schedule with GM in late Jan or Feb when his schedule allows and it is convenient for the patient. If my schedule allows I come to observe the procedure.  Thanks, MS ----- Message ----- From: Lemar Lofty., MD Sent: 01/01/2020   4:03 PM EST To: [name], MD  Malcolm, No problem. We can set her up for the end of January or February if she is OK with that. I'll have Bion Todorov work on getting this set up. I'll try to do a larger sphincteroplasty if possible, but as long as the CBD is at least 4-5 mm in size distally, we should be able to get EHL up there and work on the stone(s).  I looked at your cholangiogram, and there is some angulation issues at the near distal CBD which I suspect is from the diverticulum. If you would like to learn EHL and Spyglass, happy to let you know when the case is scheduled for and have you come if interested and if it works with your schedule. Vikki Gains, please let Judie Petit and I know when the patient chooses to schedule her ERCP. Thanks. GM ----- Message ----- From: [name], MD Sent: 01/01/2020   3:46 PM EST To: Lemar Lofty., MD  [facility],   This patient has large retained CBD stones (~15 mm) that DJ and I were not able remove. Unable to remove all stones with balloon sweeping. Unable to safely extend sphincterotomy due to a large periampullary diverticulum so balloon dilation was performed which did not allow balloon removal of 2 stones. I attempted mechanical lithotripsy however I could not position adequately and was not able to engage the stones as they appeared to be impacted in the distal duct following attempts at balloon removal. She had a biliary stent placed and then replaced at her 2 ERCPs. She has done well  with biliary stents in place. I have not performed contact lithotripsy however I don't feel it would be difficult to learn. Would you consider trying the next ERCP? Cholangioscopy case? Contact lithotripsy case?  Judie Petit

## 2020-01-02 ENCOUNTER — Ambulatory Visit: Payer: Self-pay | Admitting: Orthopedic Surgery

## 2020-01-02 ENCOUNTER — Other Ambulatory Visit: Payer: Self-pay | Admitting: Orthopedic Surgery

## 2020-01-02 NOTE — Telephone Encounter (Signed)
Left message on machine to call back  

## 2020-01-03 ENCOUNTER — Encounter (HOSPITAL_COMMUNITY): Payer: Self-pay | Admitting: Orthopedic Surgery

## 2020-01-03 ENCOUNTER — Other Ambulatory Visit: Payer: Self-pay

## 2020-01-03 ENCOUNTER — Ambulatory Visit (HOSPITAL_COMMUNITY): Payer: Medicare PPO | Admitting: Vascular Surgery

## 2020-01-03 ENCOUNTER — Encounter (HOSPITAL_COMMUNITY)
Admission: RE | Disposition: A | Discharge status: Home / Self Care | Payer: Self-pay | Source: Home / Self Care | Attending: Orthopedic Surgery

## 2020-01-03 ENCOUNTER — Ambulatory Visit (HOSPITAL_COMMUNITY)
Admission: RE | Admit: 2020-01-03 | Discharge: 2020-01-03 | Disposition: A | Discharge status: Home / Self Care | Payer: Medicare PPO | Attending: Orthopedic Surgery | Admitting: Orthopedic Surgery

## 2020-01-03 ENCOUNTER — Ambulatory Visit (HOSPITAL_COMMUNITY): Payer: Medicare PPO | Admitting: Anesthesiology

## 2020-01-03 ENCOUNTER — Ambulatory Visit (HOSPITAL_COMMUNITY): Payer: Medicare PPO

## 2020-01-03 DIAGNOSIS — Z981 Arthrodesis status: Secondary | ICD-10-CM | POA: Diagnosis not present

## 2020-01-03 DIAGNOSIS — E039 Hypothyroidism, unspecified: Secondary | ICD-10-CM | POA: Insufficient documentation

## 2020-01-03 DIAGNOSIS — F329 Major depressive disorder, single episode, unspecified: Secondary | ICD-10-CM | POA: Diagnosis not present

## 2020-01-03 DIAGNOSIS — I129 Hypertensive chronic kidney disease with stage 1 through stage 4 chronic kidney disease, or unspecified chronic kidney disease: Secondary | ICD-10-CM | POA: Diagnosis not present

## 2020-01-03 DIAGNOSIS — M8008XA Age-related osteoporosis with current pathological fracture, vertebra(e), initial encounter for fracture: Secondary | ICD-10-CM | POA: Insufficient documentation

## 2020-01-03 DIAGNOSIS — Z419 Encounter for procedure for purposes other than remedying health state, unspecified: Secondary | ICD-10-CM

## 2020-01-03 DIAGNOSIS — Z79899 Other long term (current) drug therapy: Secondary | ICD-10-CM | POA: Insufficient documentation

## 2020-01-03 DIAGNOSIS — E876 Hypokalemia: Secondary | ICD-10-CM | POA: Diagnosis not present

## 2020-01-03 DIAGNOSIS — Z79890 Hormone replacement therapy (postmenopausal): Secondary | ICD-10-CM | POA: Insufficient documentation

## 2020-01-03 DIAGNOSIS — I2510 Atherosclerotic heart disease of native coronary artery without angina pectoris: Secondary | ICD-10-CM | POA: Insufficient documentation

## 2020-01-03 DIAGNOSIS — M8088XA Other osteoporosis with current pathological fracture, vertebra(e), initial encounter for fracture: Secondary | ICD-10-CM | POA: Diagnosis not present

## 2020-01-03 DIAGNOSIS — Z791 Long term (current) use of non-steroidal anti-inflammatories (NSAID): Secondary | ICD-10-CM | POA: Insufficient documentation

## 2020-01-03 DIAGNOSIS — I480 Paroxysmal atrial fibrillation: Secondary | ICD-10-CM | POA: Insufficient documentation

## 2020-01-03 DIAGNOSIS — K219 Gastro-esophageal reflux disease without esophagitis: Secondary | ICD-10-CM | POA: Diagnosis not present

## 2020-01-03 DIAGNOSIS — Z955 Presence of coronary angioplasty implant and graft: Secondary | ICD-10-CM | POA: Insufficient documentation

## 2020-01-03 DIAGNOSIS — E785 Hyperlipidemia, unspecified: Secondary | ICD-10-CM | POA: Diagnosis not present

## 2020-01-03 DIAGNOSIS — W19XXXA Unspecified fall, initial encounter: Secondary | ICD-10-CM | POA: Insufficient documentation

## 2020-01-03 DIAGNOSIS — N182 Chronic kidney disease, stage 2 (mild): Secondary | ICD-10-CM | POA: Diagnosis not present

## 2020-01-03 DIAGNOSIS — M1990 Unspecified osteoarthritis, unspecified site: Secondary | ICD-10-CM | POA: Diagnosis not present

## 2020-01-03 DIAGNOSIS — Z01818 Encounter for other preprocedural examination: Secondary | ICD-10-CM

## 2020-01-03 HISTORY — PX: KYPHOPLASTY: SHX5884

## 2020-01-03 LAB — BASIC METABOLIC PANEL
Anion gap: 9 (ref 5–15)
BUN: 24 mg/dL — ABNORMAL HIGH (ref 8–23)
CO2: 28 mmol/L (ref 22–32)
Calcium: 9.1 mg/dL (ref 8.9–10.3)
Chloride: 105 mmol/L (ref 98–111)
Creatinine, Ser: 1.2 mg/dL — ABNORMAL HIGH (ref 0.44–1.00)
GFR calc Af Amer: 47 mL/min — ABNORMAL LOW (ref >60)
GFR calc non Af Amer: 40 mL/min — ABNORMAL LOW (ref >60)
Glucose, Bld: 90 mg/dL (ref 70–99)
Potassium: 3.6 mmol/L (ref 3.5–5.1)
Sodium: 142 mmol/L (ref 135–145)

## 2020-01-03 LAB — PROTIME-INR
INR: 1.1 (ref 0.8–1.2)
Prothrombin Time: 13.9 seconds (ref 11.4–15.2)

## 2020-01-03 SURGERY — KYPHOPLASTY
Anesthesia: Monitor Anesthesia Care | Site: Spine Lumbar

## 2020-01-03 MED ORDER — FENTANYL CITRATE (PF) 250 MCG/5ML IJ SOLN
INTRAMUSCULAR | Status: DC | PRN
  Administered 2020-01-03 (×4): 25 ug via INTRAVENOUS

## 2020-01-03 MED ORDER — PROPOFOL 10 MG/ML IV BOLUS
INTRAVENOUS | Status: AC
  Filled 2020-01-03: qty 20

## 2020-01-03 MED ORDER — PROPOFOL 10 MG/ML IV BOLUS
INTRAVENOUS | Status: DC | PRN
  Administered 2020-01-03 (×3): 10 mg via INTRAVENOUS

## 2020-01-03 MED ORDER — TRAMADOL HCL 50 MG PO TABS: 50.0000 mg | ORAL_TABLET | Freq: Four times a day (QID) | ORAL | 0 refills | Status: AC | PRN

## 2020-01-03 MED ORDER — OXYCODONE HCL 5 MG/5ML PO SOLN: 5.0000 mg | Freq: Once | ORAL | Status: DC | PRN

## 2020-01-03 MED ORDER — BUPIVACAINE-EPINEPHRINE (PF) 0.25% -1:200000 IJ SOLN
INTRAMUSCULAR | Status: AC
  Filled 2020-01-03: qty 30

## 2020-01-03 MED ORDER — LACTATED RINGERS IV SOLN: INTRAVENOUS | Status: DC | PRN

## 2020-01-03 MED ORDER — BUPIVACAINE LIPOSOME 1.3 % IJ SUSP
20.0000 mL | INTRAMUSCULAR | Status: AC
  Administered 2020-01-03: 15 mL
  Filled 2020-01-03: qty 20

## 2020-01-03 MED ORDER — IOPAMIDOL (ISOVUE-300) INJECTION 61%
INTRAVENOUS | Status: DC | PRN
  Administered 2020-01-03: 50 mL

## 2020-01-03 MED ORDER — OXYCODONE HCL 5 MG PO TABS: 5.0000 mg | ORAL_TABLET | Freq: Once | ORAL | Status: DC | PRN

## 2020-01-03 MED ORDER — FENTANYL CITRATE (PF) 250 MCG/5ML IJ SOLN
INTRAMUSCULAR | Status: AC
  Filled 2020-01-03: qty 5

## 2020-01-03 MED ORDER — BUPIVACAINE-EPINEPHRINE 0.25% -1:200000 IJ SOLN
INTRAMUSCULAR | Status: DC | PRN
  Administered 2020-01-03: 10 mL
  Administered 2020-01-03: 15 mL

## 2020-01-03 MED ORDER — 0.9 % SODIUM CHLORIDE (POUR BTL) OPTIME
TOPICAL | Status: DC | PRN
  Administered 2020-01-03: 1000 mL

## 2020-01-03 MED ORDER — ONDANSETRON HCL 4 MG/2ML IJ SOLN: 4.0000 mg | Freq: Once | INTRAMUSCULAR | Status: DC | PRN

## 2020-01-03 MED ORDER — CEFAZOLIN SODIUM-DEXTROSE 2-4 GM/100ML-% IV SOLN
2.0000 g | INTRAVENOUS | Status: AC
  Administered 2020-01-03: 08:00:00 2 g via INTRAVENOUS
  Filled 2020-01-03: qty 100

## 2020-01-03 MED ORDER — PROPOFOL 1000 MG/100ML IV EMUL
INTRAVENOUS | Status: AC
  Filled 2020-01-03: qty 100

## 2020-01-03 MED ORDER — ONDANSETRON HCL 4 MG PO TABS: 4.0000 mg | ORAL_TABLET | Freq: Three times a day (TID) | ORAL | 0 refills | Status: AC | PRN

## 2020-01-03 MED ORDER — PROPOFOL 500 MG/50ML IV EMUL
INTRAVENOUS | Status: DC | PRN
  Administered 2020-01-03: 20 ug/kg/min via INTRAVENOUS

## 2020-01-03 MED ORDER — ONDANSETRON HCL 4 MG/2ML IJ SOLN
INTRAMUSCULAR | Status: AC
  Filled 2020-01-03: qty 2

## 2020-01-03 MED ORDER — ONDANSETRON HCL 4 MG/2ML IJ SOLN: INTRAMUSCULAR | Status: DC | PRN

## 2020-01-03 MED ORDER — ONDANSETRON HCL 4 MG/2ML IJ SOLN
INTRAMUSCULAR | Status: DC | PRN
  Administered 2020-01-03: 4 mg via INTRAVENOUS

## 2020-01-03 MED ORDER — FENTANYL CITRATE (PF) 100 MCG/2ML IJ SOLN: 25.0000 ug | INTRAMUSCULAR | Status: DC | PRN

## 2020-01-03 SURGICAL SUPPLY — 43 items
BLADE SURG 15 STRL LF DISP TIS (BLADE) ×1 IMPLANT
BLADE SURG 15 STRL SS (BLADE) ×2
BNDG ADH 1X3 SHEER STRL LF (GAUZE/BANDAGES/DRESSINGS) ×6 IMPLANT
CEMENT KYPHON CX01A KIT/MIXER (Cement) ×3 IMPLANT
COVER SURGICAL LIGHT HANDLE (MISCELLANEOUS) ×3 IMPLANT
COVER WAND RF STERILE (DRAPES) ×3 IMPLANT
CURETTE EXPRESS SZ2 7MM (INSTRUMENTS) ×1 IMPLANT
CURETTE WEDGE 8.5MM KYPHX (MISCELLANEOUS) IMPLANT
CURRETTE EXPRESS SZ2 7MM (INSTRUMENTS) ×3
DERMABOND ADVANCED (GAUZE/BANDAGES/DRESSINGS) ×2
DERMABOND ADVANCED .7 DNX12 (GAUZE/BANDAGES/DRESSINGS) ×1 IMPLANT
DRAPE C-ARM 42X72 X-RAY (DRAPES) ×6 IMPLANT
DRAPE INCISE IOBAN 66X45 STRL (DRAPES) ×3 IMPLANT
DRAPE LAPAROTOMY T 102X78X121 (DRAPES) ×3 IMPLANT
DRAPE SURG 17X23 STRL (DRAPES) IMPLANT
DRAPE U-SHAPE 47X51 STRL (DRAPES) IMPLANT
DRAPE WARM FLUID 44X44 (DRAPES) ×3 IMPLANT
DURAPREP 26ML APPLICATOR (WOUND CARE) ×3 IMPLANT
GLOVE BIO SURGEON STRL SZ 6.5 (GLOVE) ×2 IMPLANT
GLOVE BIO SURGEONS STRL SZ 6.5 (GLOVE) ×1
GLOVE BIOGEL PI IND STRL 6.5 (GLOVE) ×1 IMPLANT
GLOVE BIOGEL PI IND STRL 8.5 (GLOVE) IMPLANT
GLOVE BIOGEL PI INDICATOR 6.5 (GLOVE) ×2
GLOVE BIOGEL PI INDICATOR 8.5 (GLOVE)
GLOVE SS BIOGEL STRL SZ 8.5 (GLOVE) ×1 IMPLANT
GLOVE SUPERSENSE BIOGEL SZ 8.5 (GLOVE) ×2
GOWN STRL REUS W/ TWL LRG LVL3 (GOWN DISPOSABLE) ×2 IMPLANT
GOWN STRL REUS W/TWL 2XL LVL3 (GOWN DISPOSABLE) ×3 IMPLANT
GOWN STRL REUS W/TWL LRG LVL3 (GOWN DISPOSABLE) ×4
KIT BASIN OR (CUSTOM PROCEDURE TRAY) ×3 IMPLANT
KIT TURNOVER KIT B (KITS) ×3 IMPLANT
NEEDLE HYPO 22GX1.5 SAFETY (NEEDLE) ×3 IMPLANT
NEEDLE SPNL 22GX7 QUINCKE BK (NEEDLE) ×3 IMPLANT
NS IRRIG 1000ML POUR BTL (IV SOLUTION) ×3 IMPLANT
PACK SURGICAL SETUP 50X90 (CUSTOM PROCEDURE TRAY) ×3 IMPLANT
PACK UNIVERSAL I (CUSTOM PROCEDURE TRAY) ×3 IMPLANT
PAD ARMBOARD 7.5X6 YLW CONV (MISCELLANEOUS) ×6 IMPLANT
SPONGE LAP 4X18 RFD (DISPOSABLE) ×3 IMPLANT
SUT MNCRL AB 3-0 PS2 18 (SUTURE) ×3 IMPLANT
SYR CONTROL 10ML LL (SYRINGE) ×3 IMPLANT
TOWEL GREEN STERILE (TOWEL DISPOSABLE) ×3 IMPLANT
TRAY KYPHOPAK 15/3 ONESTEP 1ST (MISCELLANEOUS) ×3 IMPLANT
WATER STERILE IRR 1000ML POUR (IV SOLUTION) ×3 IMPLANT

## 2020-01-03 NOTE — Brief Op Note (Signed)
01/03/2020  8:41 AM  PATIENT:  Christina Ortiz  86 y.o. female  PRE-OPERATIVE DIAGNOSIS:  L2 Compression fracture  POST-OPERATIVE DIAGNOSIS:  L2 Compression fracture  PROCEDURE:  Procedure(s): KYPHOPLASTY LUMBAR TWO (N/A)  SURGEON:  Surgeon(s) and Role:    Venita Lick, MD - Primary  PHYSICIAN ASSISTANT:   ASSISTANTS: none   ANESTHESIA:   IV sedation  EBL:  5 mL   BLOOD ADMINISTERED:none  DRAINS: none   LOCAL MEDICATIONS USED:  MARCAINE    and OTHER exparel  SPECIMEN:  No Specimen  DISPOSITION OF SPECIMEN:  N/A  COUNTS:  YES  TOURNIQUET:  * No tourniquets in log *  DICTATION: .Dragon Dictation  PLAN OF CARE: Discharge to home after PACU  PATIENT DISPOSITION:  PACU - hemodynamically stable.

## 2020-01-03 NOTE — Anesthesia Preprocedure Evaluation (Addendum)
Anesthesia Evaluation  Patient identified by MRN, date of birth, ID band Patient awake    Reviewed: Allergy & Precautions, NPO status , Patient's Chart, lab work & pertinent test results  Airway Mallampati: II  TM Distance: >3 FB Neck ROM: Full    Dental  (+)    Pulmonary former smoker,    Pulmonary exam normal        Cardiovascular hypertension, Pt. on medications and Pt. on home beta blockers + CAD and + Cardiac Stents (2006)  Normal cardiovascular exam+ dysrhythmias Atrial Fibrillation      Neuro/Psych PSYCHIATRIC DISORDERS Depression  Neuromuscular disease    GI/Hepatic hiatal hernia, GERD  ,(+)     substance abuse  alcohol use,   Endo/Other  Hypothyroidism   Renal/GU Renal InsufficiencyRenal disease (Cr 1.19)  negative genitourinary   Musculoskeletal  (+) Arthritis ,   Abdominal   Peds  Hematology negative hematology ROS (+)   Anesthesia Other Findings Preoperative cardiology input outline by Jory Sims, NP on 11/19/19:  "Given past medical history and time since last visit, based on ACC/AHA guidelines,Batool Rossodivitawould be at acceptable risk for the planned procedure without further cardiovascular testing.  Echo 08/22/19: EF 55-60%, severe LAE, mild MVP, mild MR, mild-mod TR   Reproductive/Obstetrics                            Anesthesia Physical  Anesthesia Plan  ASA: III  Anesthesia Plan: MAC   Post-op Pain Management:    Induction: Intravenous  PONV Risk Score and Plan: 2 and Propofol infusion, TIVA and Treatment may vary due to age or medical condition  Airway Management Planned: Natural Airway, Nasal Cannula and Simple Face Mask  Additional Equipment: None  Intra-op Plan:   Post-operative Plan:   Informed Consent: I have reviewed the patients History and Physical, chart, labs and discussed the procedure including the risks, benefits and alternatives  for the proposed anesthesia with the patient or authorized representative who has indicated his/her understanding and acceptance.       Plan Discussed with:   Anesthesia Plan Comments:        Anesthesia Quick Evaluation

## 2020-01-03 NOTE — Transfer of Care (Signed)
Immediate Anesthesia Transfer of Care Note  Patient: Christina Ortiz  Procedure(s) Performed: KYPHOPLASTY LUMBAR TWO (N/A Spine Lumbar)  Patient Location: PACU  Anesthesia Type:MAC  Level of Consciousness: awake, alert  and patient cooperative  Airway & Oxygen Therapy: Patient Spontanous Breathing  Post-op Assessment: Report given to RN and Post -op Vital signs reviewed and stable  Post vital signs: Reviewed and stable  Last Vitals:  Vitals Value Taken Time  BP 125/76 01/03/20 0833  Temp    Pulse 83 01/03/20 0834  Resp 12 01/03/20 0834  SpO2 91 % 01/03/20 0834  Vitals shown include unvalidated device data.  Last Pain:  Vitals:   01/03/20 0634  PainSc: 0-No pain      Patients Stated Pain Goal: 2 (65/68/12 7517)  Complications: No apparent anesthesia complications

## 2020-01-03 NOTE — Op Note (Signed)
Operative report  Preoperative diagnosis: L2 osteoporotic compression fracture  Postoperative diagnosis: Same  Operative procedure: L2 kyphoplasty  Complications: None  Indications: Christina Ortiz is a very pleasant 86 year old man with significant increased low back pain after a fall.  Imaging studies demonstrate an L2 compression fracture.  Attempts at conservative management failed to alleviate her symptoms and improve her quality of life.  As result we elected to move forward with surgery.  All appropriate risks benefits and alternatives were discussed with the patient and consent was obtained.  Operative report: Patient is brought the operating room placed prone on the operating room table.  The back was then prepped and draped in a standard fashion.  A timeout was taken to confirm patient procedure and all other important data.  After successful induction of IV anesthesia a fluoroscopy scopic machine was placed in the lateral plane and a second in the AP plane.  At this point I had biplane visualization of the L2 vertebral body.  Once I confirmed I was at the appropriate level I marked out the lateral border of the pedicles.  I infiltrated this area with quarter percent Marcaine with epinephrine.  I then used a spinal needle to infiltrate down to the facet complex and infiltrated the bone and the deep paraspinal muscles for improved intraoperative analgesia.  A small stab incision was made and advanced the Jamshidi needle down to the L2 pedicle.  Using both the AP and lateral planes I confirmed position and trajectory.  I advanced the Jamshidi needle into the L2 pedicle.  As I was nearing the medial wall based on the AP view I confirmed that I was just beyond the steer wall the vertebral body on the lateral view.  Confirming trajectory and position I advanced into the vertebral body.  I repeated this procedure on the contralateral side.  I then used the drill and curette and then placed the inflatable bone  tamp.  I did sound the canal to ensure had a solid bony.  Once the inflatable bone tamps were and I inflated them and then prepared the cement.  I then deflated 1 balloon and inserted approximately 1-1/2 cc of cement and then deflated the other balloon and placed another 1-1/2 cc of cement  This was all done using fluoroscopy imaging.  At no point was there any displacement of the cement.  Once the cement mantle was allowed to harden I remove the trochars and took my final x-rays.  There was an excellent intervertebral cement mantle with no leakage.  3-0 Monocryl simple stitch was used to close the skin edges and I infiltrated the area again with additional Marcaine with Exparel.  Dermabond and a Band-Aid were applied and the patient was transferred to the PACU without incident.  The end of the case all needle sponge counts were correct.  Patient tolerated procedure well.

## 2020-01-03 NOTE — H&P (Signed)
Addendum H&P:  With no change in the patient's clinical exam since her last office visit of 12/31/2019.  She continues to have significant lumbar pain secondary to the L2 compression fracture.  While she does have underlying degenerative disease I believe her acute increase in pain is secondary to the L2 fracture.  Were planning on moving forward with L2 kyphoplasty.  I have gone over the surgery as well as the risks and benefits with the patient in great detail and all of her questions were encouraged and addressed.

## 2020-01-03 NOTE — Anesthesia Postprocedure Evaluation (Signed)
Anesthesia Post Note  Patient: Christina Ortiz  Procedure(s) Performed: KYPHOPLASTY LUMBAR TWO (N/A Spine Lumbar)     Patient location during evaluation: PACU Anesthesia Type: MAC Level of consciousness: awake and alert Pain management: pain level controlled Vital Signs Assessment: post-procedure vital signs reviewed and stable Respiratory status: spontaneous breathing, nonlabored ventilation and respiratory function stable Cardiovascular status: blood pressure returned to baseline and stable Postop Assessment: no apparent nausea or vomiting Anesthetic complications: no    Last Vitals:  Vitals:   01/03/20 0903 01/03/20 0911  BP: (!) 123/59   Pulse: 77 74  Resp: 12 20  Temp:    SpO2: 99% 99%    Last Pain:  Vitals:   01/03/20 0930  PainSc: 0-No pain                 Lidia Collum

## 2020-01-03 NOTE — Anesthesia Procedure Notes (Signed)
Procedure Name: MAC Date/Time: 01/03/2020 7:38 AM Performed by: Janace Litten, CRNA Pre-anesthesia Checklist: Patient identified, Emergency Drugs available, Suction available and Patient being monitored Patient Re-evaluated:Patient Re-evaluated prior to induction Oxygen Delivery Method: Simple face mask

## 2020-01-04 ENCOUNTER — Other Ambulatory Visit: Payer: Self-pay

## 2020-01-04 DIAGNOSIS — K831 Obstruction of bile duct: Secondary | ICD-10-CM

## 2020-01-04 LAB — EKG 12-LEAD

## 2020-01-04 NOTE — Telephone Encounter (Signed)
Pt has been scheduled for 02/07/20 at 730 am [facility] with Dr Meridee Score for ERCP Spyglass.  The pt's daughter has been advised and also aware of the COVID test date and time.  I will send to her My Chart for review.  I have also sent a staff message to Tomma Rakers RN at endo to make her aware of the case per her memo.  Xarelto hold has already been ok'd for 2 day hold see 11/6 phone note

## 2020-01-04 NOTE — Telephone Encounter (Signed)
Patty, Thanks for getting all thing set up. Unless something changes dramatically in regards to Hospitalizations and COVID, will plan to proceed with the ERCP at the scheduled time course. MS, I hope you can join. GM

## 2020-01-14 ENCOUNTER — Other Ambulatory Visit: Payer: Self-pay | Admitting: Adult Health

## 2020-01-19 ENCOUNTER — Ambulatory Visit: Payer: Medicare PPO | Attending: Internal Medicine

## 2020-01-19 DIAGNOSIS — Z23 Encounter for immunization: Secondary | ICD-10-CM | POA: Insufficient documentation

## 2020-01-19 NOTE — Progress Notes (Signed)
   Covid-19 Vaccination Clinic  Name:  Laticia Vannostrand    MRN: [id number] DOB: Jun 10, 1929  01/19/2020  Ms. Blasdel was observed post Covid-19 immunization for 15 minutes without incidence. She was provided with Vaccine Information Sheet and instruction to access the V-Safe system.   Ms. Sasaki was instructed to call 911 with any severe reactions post vaccine: Marland Kitchen Difficulty breathing  . Swelling of your face and throat  . A fast heartbeat  . A bad rash all over your body  . Dizziness and weakness    Immunizations Administered    Name Date Dose VIS Date Route   Pfizer COVID-19 Vaccine 01/19/2020  1:51 PM 0.3 mL 12/07/2019 Intramuscular   Manufacturer: [employer], [employer]   Lot: [id number]   NDC: [address]-2

## 2020-01-21 ENCOUNTER — Other Ambulatory Visit: Payer: Self-pay | Admitting: Adult Health

## 2020-01-28 ENCOUNTER — Telehealth: Payer: Self-pay

## 2020-01-28 ENCOUNTER — Other Ambulatory Visit: Payer: Self-pay | Admitting: Adult Health

## 2020-01-28 NOTE — Telephone Encounter (Signed)
Please call pt to schedule appt.  No further refills until pt is seen.  T. Cruz Devilla, CMA  

## 2020-02-01 ENCOUNTER — Other Ambulatory Visit: Payer: Self-pay | Admitting: Adult Health

## 2020-02-04 ENCOUNTER — Other Ambulatory Visit (HOSPITAL_COMMUNITY)
Admission: RE | Admit: 2020-02-04 | Discharge: 2020-02-04 | Disposition: A | Discharge status: Home / Self Care | Payer: Medicare PPO | Source: Ambulatory Visit | Attending: Gastroenterology | Admitting: Gastroenterology

## 2020-02-04 DIAGNOSIS — Z20822 Contact with and (suspected) exposure to covid-19: Secondary | ICD-10-CM | POA: Insufficient documentation

## 2020-02-04 DIAGNOSIS — Z01812 Encounter for preprocedural laboratory examination: Secondary | ICD-10-CM | POA: Insufficient documentation

## 2020-02-04 LAB — SARS CORONAVIRUS 2 (TAT 6-24 HRS): SARS Coronavirus 2: NEGATIVE

## 2020-02-06 ENCOUNTER — Encounter (HOSPITAL_COMMUNITY): Payer: Self-pay | Admitting: Gastroenterology

## 2020-02-06 ENCOUNTER — Other Ambulatory Visit: Payer: Self-pay

## 2020-02-06 NOTE — Progress Notes (Signed)
Spoke with Daughter Christina Ortiz [phone], who states patient does not have any SOB, fever, cough or chest pain.  PCP - William Hamburger, PA Cardiologist - Dr Anne Fu  Chest x-ray - 12/31/19, (2V) EKG - 01/04/20 Stress Test - 2015 ECHO - 08/22/19 Cardiac Cath - > than 10 yrs ago in [place]  Blood Thinner Instructions:  Last dose xarelto was on Tuesday, 02/05/20.   Anesthesia review: Yes. Hx CAD on xarelto  STOP now taking any Aspirin (unless otherwise instructed by your surgeon), Aleve, Naproxen, Ibuprofen, Motrin, Advil, Goody's, BC's, all herbal medications, fish oil, and all vitamins.   Coronavirus Screening Covid test on 02/04/20 was negative.  Daughter Christina Ortiz verbalized understanding of instructions that were given via phone.

## 2020-02-06 NOTE — Progress Notes (Signed)
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 Pt was recently cleared by cardiology to undergo kyphoplasty which was done 01/04/20 without complication. Per clearance 11/19/19, "Given past medical history and time since last visit, based on ACC/AHA guidelines,Christina Rossodivitawould be at acceptable risk for the planned procedure without further cardiovascular testing. Followed by Dr. Anne Fu, last seen in August of 2020. Pharmacy has made recommendations to hold Xarelto for 3 days prior to procedure."  LD Xarelto per pt on 02/05/20.  Will need DOS labs and eval.   EKG 01/04/20: Afib. Rate 68. Rightward axis. Septal infarct, age undetermined.  Echo 08/22/19: IMPRESSIONS 1. The left ventricle has normal systolic function, with an ejection fraction of 55-60%. The cavity size was normal. Left ventricular diastolic Doppler parameters are consistent with pseudonormalization. 2. The right ventricle has normal systolic function. The cavity was normal. There is no increase in right ventricular wall thickness. 3. Left atrial size was severely dilated. 4. Right atrial size was mildly dilated. 5. Mild mitral valve prolapse. 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 Mild thickening of the mitral valve leaflet. There is mild to moderate mitral annular calcification present. Mild mitral regurgitation. There is mild prolapse of both mitral leaflets. 7. Tricuspid valve regurgitation is mild-moderate. 8. The aortic valve is grossly normal. Mild thickening of the aortic valve. Mild calcification of the aortic valve. No stenosis of the aortic valve. 9. The aorta is abnormal unless otherwise noted. 10. There is mild to moderate dilatation of the aortic root and of the ascending aorta. Ao Root 3.20 cm. Ao Ascending 3.90 cm. 11. The atrial septum is grossly normal.   Event monitor 02/23/14-03/08/14 [facility]): Impression: Baseline rhythm: Sinus rhythm First-degree AV block and second-degree AV block Mobitz type I Rare PACs Nonsustained ventricular tachycardia with monomorphic morphology lasting 5 beats with heart rate of 158 bpm.  Zannie Cove [facility] Short Stay Center/Anesthesiology Phone [phone] 02/06/2020 12:11 PM

## 2020-02-06 NOTE — Anesthesia Preprocedure Evaluation (Addendum)
Anesthesia Evaluation  Patient identified by MRN, date of birth, ID band Patient awake    Reviewed: Allergy & Precautions, NPO status , Patient's Chart, lab work & pertinent test results  History of Anesthesia Complications Negative for: history of anesthetic complications  Airway Mallampati: II  TM Distance: >3 FB Neck ROM: Full    Dental  (+) Dental Advisory Given   Pulmonary shortness of breath, neg recent URI, former smoker,    breath sounds clear to auscultation       Cardiovascular hypertension, + CAD and + Cardiac Stents  + dysrhythmias Atrial Fibrillation  Rhythm:Irregular     Neuro/Psych PSYCHIATRIC DISORDERS Depression  Neuromuscular disease    GI/Hepatic hiatal hernia, GERD  ,CBD stone   Endo/Other  Hypothyroidism   Renal/GU Renal InsufficiencyRenal diseasenegative Renal ROS     Musculoskeletal  (+) Arthritis ,   Abdominal   Peds  Hematology negative hematology ROS (+)   Anesthesia Other Findings   Reproductive/Obstetrics                          Anesthesia Physical Anesthesia Plan  ASA: III  Anesthesia Plan: General   Post-op Pain Management:    Induction: Intravenous  PONV Risk Score and Plan: 3 and Ondansetron and Dexamethasone  Airway Management Planned: Oral ETT  Additional Equipment: None  Intra-op Plan:   Post-operative Plan: Extubation in OR  Informed Consent: I have reviewed the patients History and Physical, chart, labs and discussed the procedure including the risks, benefits and alternatives for the proposed anesthesia with the patient or authorized representative who has indicated his/her understanding and acceptance.     Dental advisory given  Plan Discussed with: CRNA and Surgeon  Anesthesia Plan Comments: (Follows with cardiology for hx of CAD (s/p stents to LAD and RCA 2006, [place]), PAF, 2nd Degree AVB type 1 (2015 event monitor). Pt was recently  cleared by cardiology to undergo kyphoplasty which was done 01/04/20 without complication. Per clearance 11/19/19, "Given past medical history and time since last visit, based on ACC/AHA guidelines,Christina Rossodivitawould be at acceptable risk for the planned procedure without further cardiovascular testing. Followed by Dr. Anne Fu, last seen in August of 2020. Pharmacy has made recommendations to hold Xarelto for 3 days prior to procedure."  LD Xarelto per pt on 02/05/20.  Will need DOS labs and eval.  EKG 01/04/20: Afib. Rate 68. Rightward axis. Septal infarct, age undetermined.  Echo 08/22/19: IMPRESSIONS 1. The left ventricle has normal systolic function, with an ejection fraction of 55-60%. The cavity size was normal. Left ventricular diastolic Doppler parameters are consistent with pseudonormalization. 2. The right ventricle has normal systolic function. The cavity was normal. There is no increase in right ventricular wall thickness. 3. Left atrial size was severely dilated. 4. Right atrial size was mildly dilated. 5. Mild mitral valve prolapse. 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 Mild thickening of the mitral valve leaflet. There is mild to moderate mitral annular calcification present. Mild mitral regurgitation. There is mild prolapse of both mitral leaflets. 7. Tricuspid valve regurgitation is mild-moderate. 8. The aortic valve is grossly normal. Mild thickening of the aortic valve. Mild calcification of the aortic valve. No stenosis of the aortic valve. 9. The aorta is abnormal unless otherwise noted. 10. There is mild to moderate dilatation of the aortic root and of the ascending aorta. Ao Root 3.20 cm. Ao Ascending 3.90 cm. 11. The atrial septum is grossly normal.  Event monitor 02/23/14-03/08/14 [facility]): Impression: Baseline rhythm: Sinus rhythm First-degree AV block and second-degree AV block Mobitz type I Rare PACs Nonsustained ventricular tachycardia with  monomorphic morphology lasting 5 beats with heart rate of 158 bpm.)       Anesthesia Quick Evaluation

## 2020-02-07 ENCOUNTER — Ambulatory Visit (HOSPITAL_COMMUNITY): Payer: Medicare PPO

## 2020-02-07 ENCOUNTER — Ambulatory Visit (HOSPITAL_COMMUNITY): Payer: Medicare PPO | Admitting: Physician Assistant

## 2020-02-07 ENCOUNTER — Encounter (HOSPITAL_COMMUNITY)
Admission: RE | Disposition: A | Discharge status: Home / Self Care | Payer: Self-pay | Source: Home / Self Care | Attending: Gastroenterology

## 2020-02-07 ENCOUNTER — Encounter (HOSPITAL_COMMUNITY): Payer: Self-pay | Admitting: Gastroenterology

## 2020-02-07 ENCOUNTER — Ambulatory Visit (HOSPITAL_COMMUNITY)
Admission: RE | Admit: 2020-02-07 | Discharge: 2020-02-07 | Disposition: A | Discharge status: Home / Self Care | Payer: Medicare PPO | Attending: Gastroenterology | Admitting: Gastroenterology

## 2020-02-07 DIAGNOSIS — G709 Myoneural disorder, unspecified: Secondary | ICD-10-CM | POA: Insufficient documentation

## 2020-02-07 DIAGNOSIS — N189 Chronic kidney disease, unspecified: Secondary | ICD-10-CM | POA: Diagnosis not present

## 2020-02-07 DIAGNOSIS — Z888 Allergy status to other drugs, medicaments and biological substances status: Secondary | ICD-10-CM | POA: Diagnosis not present

## 2020-02-07 DIAGNOSIS — I480 Paroxysmal atrial fibrillation: Secondary | ICD-10-CM | POA: Diagnosis not present

## 2020-02-07 DIAGNOSIS — Z87891 Personal history of nicotine dependence: Secondary | ICD-10-CM | POA: Insufficient documentation

## 2020-02-07 DIAGNOSIS — K8050 Calculus of bile duct without cholangitis or cholecystitis without obstruction: Secondary | ICD-10-CM | POA: Diagnosis not present

## 2020-02-07 DIAGNOSIS — K449 Diaphragmatic hernia without obstruction or gangrene: Secondary | ICD-10-CM | POA: Diagnosis not present

## 2020-02-07 DIAGNOSIS — K3189 Other diseases of stomach and duodenum: Secondary | ICD-10-CM

## 2020-02-07 DIAGNOSIS — I472 Ventricular tachycardia: Secondary | ICD-10-CM | POA: Insufficient documentation

## 2020-02-07 DIAGNOSIS — M1990 Unspecified osteoarthritis, unspecified site: Secondary | ICD-10-CM | POA: Diagnosis not present

## 2020-02-07 DIAGNOSIS — Z955 Presence of coronary angioplasty implant and graft: Secondary | ICD-10-CM | POA: Insufficient documentation

## 2020-02-07 DIAGNOSIS — I129 Hypertensive chronic kidney disease with stage 1 through stage 4 chronic kidney disease, or unspecified chronic kidney disease: Secondary | ICD-10-CM | POA: Insufficient documentation

## 2020-02-07 DIAGNOSIS — I2510 Atherosclerotic heart disease of native coronary artery without angina pectoris: Secondary | ICD-10-CM | POA: Insufficient documentation

## 2020-02-07 DIAGNOSIS — Z7901 Long term (current) use of anticoagulants: Secondary | ICD-10-CM | POA: Diagnosis not present

## 2020-02-07 DIAGNOSIS — Z4659 Encounter for fitting and adjustment of other gastrointestinal appliance and device: Secondary | ICD-10-CM | POA: Diagnosis not present

## 2020-02-07 DIAGNOSIS — N182 Chronic kidney disease, stage 2 (mild): Secondary | ICD-10-CM | POA: Diagnosis not present

## 2020-02-07 DIAGNOSIS — I441 Atrioventricular block, second degree: Secondary | ICD-10-CM | POA: Insufficient documentation

## 2020-02-07 DIAGNOSIS — I081 Rheumatic disorders of both mitral and tricuspid valves: Secondary | ICD-10-CM | POA: Insufficient documentation

## 2020-02-07 DIAGNOSIS — K831 Obstruction of bile duct: Secondary | ICD-10-CM

## 2020-02-07 DIAGNOSIS — K838 Other specified diseases of biliary tract: Secondary | ICD-10-CM | POA: Diagnosis not present

## 2020-02-07 HISTORY — PX: REMOVAL OF STONES: SHX5545

## 2020-02-07 HISTORY — PX: BILIARY DILATION: SHX6850

## 2020-02-07 HISTORY — PX: SPYGLASS CHOLANGIOSCOPY: SHX5441

## 2020-02-07 HISTORY — PX: BILIARY STENT PLACEMENT: SHX5538

## 2020-02-07 HISTORY — PX: ENDOSCOPIC RETROGRADE CHOLANGIOPANCREATOGRAPHY (ERCP) WITH PROPOFOL: SHX5810

## 2020-02-07 HISTORY — PX: STENT REMOVAL: SHX6421

## 2020-02-07 HISTORY — PX: SPYGLASS LITHOTRIPSY: SHX5537

## 2020-02-07 SURGERY — ENDOSCOPIC RETROGRADE CHOLANGIOPANCREATOGRAPHY (ERCP) WITH PROPOFOL
Anesthesia: General

## 2020-02-07 MED ORDER — GLUCAGON HCL RDNA (DIAGNOSTIC) 1 MG IJ SOLR
INTRAMUSCULAR | Status: DC | PRN
  Administered 2020-02-07 (×4): .25 mg via INTRAVENOUS

## 2020-02-07 MED ORDER — CIPROFLOXACIN HCL 500 MG PO TABS: 500.0000 mg | ORAL_TABLET | Freq: Two times a day (BID) | ORAL | 0 refills | Status: AC

## 2020-02-07 MED ORDER — SUGAMMADEX SODIUM 200 MG/2ML IV SOLN
INTRAVENOUS | Status: DC | PRN
  Administered 2020-02-07: 200 mg via INTRAVENOUS

## 2020-02-07 MED ORDER — DEXAMETHASONE SODIUM PHOSPHATE 10 MG/ML IJ SOLN
INTRAMUSCULAR | Status: DC | PRN
  Administered 2020-02-07: 4 mg via INTRAVENOUS

## 2020-02-07 MED ORDER — ROCURONIUM BROMIDE 10 MG/ML (PF) SYRINGE
PREFILLED_SYRINGE | INTRAVENOUS | Status: DC | PRN
  Administered 2020-02-07: 60 mg via INTRAVENOUS
  Administered 2020-02-07: 20 mg via INTRAVENOUS

## 2020-02-07 MED ORDER — SODIUM CHLORIDE 0.9 % IV SOLN: INTRAVENOUS | Status: DC

## 2020-02-07 MED ORDER — PHENYLEPHRINE 40 MCG/ML (10ML) SYRINGE FOR IV PUSH (FOR BLOOD PRESSURE SUPPORT)
PREFILLED_SYRINGE | INTRAVENOUS | Status: DC | PRN
  Administered 2020-02-07: 80 ug via INTRAVENOUS
  Administered 2020-02-07: 60 ug via INTRAVENOUS
  Administered 2020-02-07: 80 ug via INTRAVENOUS

## 2020-02-07 MED ORDER — GLUCAGON HCL RDNA (DIAGNOSTIC) 1 MG IJ SOLR
INTRAMUSCULAR | Status: AC
  Filled 2020-02-07: qty 2

## 2020-02-07 MED ORDER — CIPROFLOXACIN IN D5W 400 MG/200ML IV SOLN
INTRAVENOUS | Status: AC
  Filled 2020-02-07: qty 200

## 2020-02-07 MED ORDER — ONDANSETRON HCL 4 MG/2ML IJ SOLN
INTRAMUSCULAR | Status: DC | PRN
  Administered 2020-02-07: 4 mg via INTRAVENOUS

## 2020-02-07 MED ORDER — LACTATED RINGERS IV SOLN: INTRAVENOUS | Status: DC

## 2020-02-07 MED ORDER — INDOMETHACIN 50 MG RE SUPP
RECTAL | Status: DC | PRN
  Administered 2020-02-07: 100 mg via RECTAL

## 2020-02-07 MED ORDER — PHENYLEPHRINE HCL-NACL 10-0.9 MG/250ML-% IV SOLN
INTRAVENOUS | Status: DC | PRN
  Administered 2020-02-07: 50 ug/min via INTRAVENOUS

## 2020-02-07 MED ORDER — INDOMETHACIN 50 MG RE SUPP
RECTAL | Status: AC
  Filled 2020-02-07: qty 2

## 2020-02-07 MED ORDER — CIPROFLOXACIN IN D5W 400 MG/200ML IV SOLN
INTRAVENOUS | Status: DC | PRN
  Administered 2020-02-07: 400 mg via INTRAVENOUS

## 2020-02-07 MED ORDER — LIDOCAINE 2% (20 MG/ML) 5 ML SYRINGE
INTRAMUSCULAR | Status: DC | PRN
  Administered 2020-02-07: 60 mg via INTRAVENOUS

## 2020-02-07 MED ORDER — PROPOFOL 10 MG/ML IV BOLUS
INTRAVENOUS | Status: DC | PRN
  Administered 2020-02-07: 40 mg via INTRAVENOUS
  Administered 2020-02-07: 80 mg via INTRAVENOUS

## 2020-02-07 MED ORDER — ESMOLOL HCL 100 MG/10ML IV SOLN
INTRAVENOUS | Status: DC | PRN
  Administered 2020-02-07 (×2): 30 mg via INTRAVENOUS

## 2020-02-07 MED ORDER — SODIUM CHLORIDE 0.9 % IV SOLN
INTRAVENOUS | Status: DC | PRN
  Administered 2020-02-07: 10:00:00 105 mL

## 2020-02-07 MED ORDER — FENTANYL CITRATE (PF) 250 MCG/5ML IJ SOLN
INTRAMUSCULAR | Status: DC | PRN
  Administered 2020-02-07 (×2): 25 ug via INTRAVENOUS

## 2020-02-07 NOTE — H&P (Signed)
GASTROENTEROLOGY PROCEDURE H&P NOTE   Primary Care Physician: Julaine Fusi, NP  HPI: Christina Ortiz is a 86 y.o. female who presents for ERCP.  Past Medical History:  Diagnosis Date  . Acute bronchitis 10/2017  . Arthritis   . CKD (chronic kidney disease)    Xarelto dose is 15 mg QD  . Coronary artery disease    a. s/p PCI/stenting to RCA and LAD ([facility] in [place]) in 2006; b. Nuc 1/15: No ischemia, EF 70%  . Depression   . Gallstones   . Gastritis   . GERD (gastroesophageal reflux disease)   . Hiatal hernia   . Hip fracture (HCC)   . History of cholecystectomy   . History of echocardiogram    a. Echo 1/15: Moderate LVH, EF 55-60%, impaired relaxation, mild AS  . Hyperlipidemia   . Hypertension   . Hypokalemia   . Hypothyroidism   . Mobitz type 1 second degree atrioventricular block    a. Event Monitor 3/15: NSR, first-degree AV block, second-degree AB block - Mobitz 1, PACs, NSVT (5 beats)  . PAF (paroxysmal atrial fibrillation) (HCC)   . Thyroid disease    Past Surgical History:  Procedure Laterality Date  . ANGIOPLASTY     with sent placement  . BILIARY DILATION  10/18/2019   Procedure: BILIARY DILATION;  Surgeon: Rachael Fee, MD;  Location: [facility];  Service: Endoscopy;;  . BILIARY DILATION  12/17/2019   Procedure: BILIARY DILATION;  Surgeon: [name], MD;  Location: [facility];  Service: Endoscopy;;  . BILIARY STENT PLACEMENT N/A 10/18/2019   Procedure: BILIARY STENT PLACEMENT;  Surgeon: Rachael Fee, MD;  Location: [facility];  Service: Endoscopy;  Laterality: N/A;  . BILIARY STENT PLACEMENT N/A 12/17/2019   Procedure: BILIARY STENT PLACEMENT;  Surgeon: [name], MD;  Location: [facility];  Service: Endoscopy;  Laterality: N/A;  . BLEPHAROPLASTY    . BREAST LUMPECTOMY Left   . CARDIAC CATHETERIZATION    . CATARACT EXTRACTION Bilateral   . CHOLECYSTECTOMY    . DILATION AND CURETTAGE OF UTERUS    . ENDOSCOPIC  RETROGRADE CHOLANGIOPANCREATOGRAPHY (ERCP) WITH PROPOFOL N/A 12/17/2019   Procedure: ENDOSCOPIC RETROGRADE CHOLANGIOPANCREATOGRAPHY (ERCP) WITH PROPOFOL;  Surgeon: [name], MD;  Location: [facility];  Service: Endoscopy;  Laterality: N/A;  . ERCP N/A 10/18/2019   Procedure: ENDOSCOPIC RETROGRADE CHOLANGIOPANCREATOGRAPHY (ERCP);  Surgeon: Rachael Fee, MD;  Location: [facility];  Service: Endoscopy;  Laterality: N/A;  . ESOPHAGOGASTRODUODENOSCOPY (EGD) WITH PROPOFOL N/A 12/17/2019   Procedure: ESOPHAGOGASTRODUODENOSCOPY (EGD) WITH PROPOFOL;  Surgeon: [name], MD;  Location: [facility];  Service: Endoscopy;  Laterality: N/A;  . KYPHOPLASTY N/A 01/03/2020   Procedure: KYPHOPLASTY LUMBAR TWO;  Surgeon: Venita Lick, MD;  Location: [facility] OR;  Service: Orthopedics;  Laterality: N/A;  . REMOVAL OF STONES  10/18/2019   Procedure: REMOVAL OF STONES;  Surgeon: Rachael Fee, MD;  Location: [facility];  Service: Endoscopy;;  . REMOVAL OF STONES  12/17/2019   Procedure: REMOVAL OF STONES;  Surgeon: [name], MD;  Location: [facility];  Service: Endoscopy;;  . Gaspar Bidding DILATION N/A 12/17/2019   Procedure: SAVORY DILATION;  Surgeon: [name], MD;  Location: [facility];  Service: Endoscopy;  Laterality: N/A;  . SPHINCTEROTOMY  10/18/2019   Procedure: SPHINCTEROTOMY;  Surgeon: Rachael Fee, MD;  Location: [facility];  Service: Endoscopy;;  . Dennison Mascot  12/17/2019   Procedure: Dennison Mascot;  Surgeon: [name], MD;  Location: [facility];  Service: Endoscopy;;  . STENT REMOVAL  12/17/2019   Procedure: STENT REMOVAL;  Surgeon: [name], MD;  Location: [facility];  Service: Endoscopy;;   Current Facility-Administered Medications  Medication Dose Route Frequency Provider Last Rate Last Admin  . lactated ringers infusion   Intravenous Continuous Mansouraty, Netty Starring., MD       Allergies  Allergen Reactions  . Tape Other (See  Comments)    Patient's skin is thin and will TEAR AND BRUISE EASILY   Family History  Problem Relation Age of Onset  . Heart disease Mother   . Heart attack Mother   . Lung cancer Father   . Lymphoma Brother   . Heart failure Brother   . Esophageal cancer Paternal Uncle   . Colon cancer Neg Hx   . Colon polyps Neg Hx   . Rectal cancer Neg Hx   . Stomach cancer Neg Hx    Social History   Socioeconomic History  . Marital status: Widowed    Spouse name: Not on file  . Number of children: 1  . Years of education: Not on file  . Highest education level: Not on file  Occupational History  . Occupation: retired  Tobacco Use  . Smoking status: Former Smoker    Packs/day: 0.50    Years: 67.00    Pack years: 33.50    Types: Cigarettes    Quit date: 10/24/2014    Years since quitting: 5.2  . Smokeless tobacco: Never Used  . Tobacco comment: quit 2015  Substance and Sexual Activity  . Alcohol use: Yes    Alcohol/week: 10.0 - 14.0 standard drinks    Types: 10 - 14 Shots of liquor per week    Comment: vodka  . Drug use: No  . Sexual activity: Never    Birth control/protection: Post-menopausal  Other Topics Concern  . Not on file  Social History Narrative  . Not on file   Social Determinants of Health   Financial Resource Strain:   . Difficulty of Paying Living Expenses: Not on file  Food Insecurity:   . Worried About Programme researcher, broadcasting/film/video in the Last Year: Not on file  . Ran Out of Food in the Last Year: Not on file  Transportation Needs:   . Lack of Transportation (Medical): Not on file  . Lack of Transportation (Non-Medical): Not on file  Physical Activity:   . Days of Exercise per Week: Not on file  . Minutes of Exercise per Session: Not on file  Stress:   . Feeling of Stress : Not on file  Social Connections:   . Frequency of Communication with Friends and Family: Not on file  . Frequency of Social Gatherings with Friends and Family: Not on file  . Attends  Religious Services: Not on file  . Active Member of Clubs or Organizations: Not on file  . Attends Banker Meetings: Not on file  . Marital Status: Not on file  Intimate Partner Violence:   . Fear of Current or Ex-Partner: Not on file  . Emotionally Abused: Not on file  . Physically Abused: Not on file  . Sexually Abused: Not on file    Physical Exam: Vital signs in last 24 hours: Temp:  [99.1 F (37.3 C)] 99.1 F (37.3 C) (02/11 0710) Pulse Rate:  [73] 73 (02/11 0710) Resp:  [16] 16 (02/11 0710) BP: (138)/(75) 138/75 (02/11 0710) SpO2:  [94 %] 94 % (02/11 0710)  Weight:  [68 kg] 68 kg (02/11 0649)   GEN: NAD EYE: Sclerae anicteric ENT: MMM CV: Non-tachycardic GI: Soft, mild TTP in midabdomen NEURO:  Alert & Oriented x 3  Lab Results: No results for input(s): WBC, HGB, HCT, PLT in the last 72 hours. BMET No results for input(s): NA, K, CL, CO2, GLUCOSE, BUN, CREATININE, CALCIUM in the last 72 hours. LFT No results for input(s): PROT, ALBUMIN, AST, ALT, ALKPHOS, BILITOT, BILIDIR, IBILI in the last 72 hours. PT/INR No results for input(s): LABPROT, INR in the last 72 hours.   Impression / Plan: This is a 86 y.o.female who presents for ERCP.  The risks of an ERCP were discussed at length, including but not limited to the risk of perforation, bleeding, abdominal pain, post-ERCP pancreatitis (while usually mild can be severe and even life threatening).  The risks and benefits of endoscopic evaluation were discussed with the patient; these include but are not limited to the risk of perforation, infection, bleeding, missed lesions, lack of diagnosis, severe illness requiring hospitalization, as well as anesthesia and sedation related illnesses.  The patient is agreeable to proceed.    Justice Britain, MD [facility]oscopy Office # [phone]

## 2020-02-07 NOTE — Anesthesia Procedure Notes (Addendum)
Procedure Name: Intubation Date/Time: 02/07/2020 7:44 AM Performed by: Milford Cage, CRNA Pre-anesthesia Checklist: Patient identified, Timeout performed, Patient being monitored, Suction available and Emergency Drugs available Patient Re-evaluated:Patient Re-evaluated prior to induction Oxygen Delivery Method: Circle system utilized Preoxygenation: Pre-oxygenation with 100% oxygen Induction Type: IV induction Ventilation: Mask ventilation without difficulty Laryngoscope Size: Miller and 2 Grade View: Grade I Tube type: Oral Tube size: 7.0 mm Number of attempts: 2 (attempt x 1 with MAC 3 blade, successful placement with Miller 2, G1 view) Airway Equipment and Method: Stylet Placement Confirmation: ETT inserted through vocal cords under direct vision,  CO2 detector,  positive ETCO2 and breath sounds checked- equal and bilateral Secured at: 22 cm Tube secured with: Tape Dental Injury: Teeth and Oropharynx as per pre-operative assessment  Comments: Performed by Arizona Constable

## 2020-02-07 NOTE — Transfer of Care (Signed)
Immediate Anesthesia Transfer of Care Note  Patient: Christina Ortiz  Procedure(s) Performed: ENDOSCOPIC RETROGRADE CHOLANGIOPANCREATOGRAPHY (ERCP) WITH PROPOFOL (N/A ) SPYGLASS CHOLANGIOSCOPY (N/A ) SPYGLASS LITHOTRIPSY (N/A ) STENT REMOVAL BILIARY DILATION REMOVAL OF STONES BILIARY STENT PLACEMENT  Patient Location: Endoscopy Unit  Anesthesia Type:General  Level of Consciousness: drowsy, patient cooperative and responds to stimulation  Airway & Oxygen Therapy: Patient Spontanous Breathing  Post-op Assessment: Report given to RN, Post -op Vital signs reviewed and stable, Patient moving all extremities and Patient moving all extremities X 4  Post vital signs: Reviewed  Last Vitals:  Vitals Value Taken Time  BP 156/89 02/07/20 1002  Temp 36.8 C 02/07/20 1000  Pulse 59 02/07/20 1006  Resp 17 02/07/20 1006  SpO2 94 % 02/07/20 1006  Vitals shown include unvalidated device data.  Last Pain:  Vitals:   02/07/20 1000  TempSrc: Oral  PainSc: 0-No pain         Complications: No apparent anesthesia complications

## 2020-02-07 NOTE — Discharge Instructions (Signed)
Ciprofloxacin 500 mg twice daily for 3-days has been prescribed. May restart Xarelto in 48 hours to decrease risk of post-procedural bleeding.  YOU HAD AN ENDOSCOPIC PROCEDURE TODAY: Refer to the procedure report and other information in the discharge instructions given to you for any specific questions about what was found during the examination. If this information does not answer your questions, please call [place] office at [phone] to clarify.   YOU SHOULD EXPECT: Some feelings of bloating in the abdomen. Passage of more gas than usual. Walking can help get rid of the air that was put into your GI tract during the procedure and reduce the bloating. If you had a lower endoscopy (such as a colonoscopy or flexible sigmoidoscopy) you may notice spotting of blood in your stool or on the toilet paper. Some abdominal soreness may be present for a day or two, also.  DIET: Your first meal following the procedure should be a light meal and then it is ok to progress to your normal diet. A half-sandwich or bowl of soup is an example of a good first meal. Heavy or fried foods are harder to digest and may make you feel nauseous or bloated. Drink plenty of fluids but you should avoid alcoholic beverages for 24 hours. If you had a esophageal dilation, please see attached instructions for diet.    ACTIVITY: Your care partner should take you home directly after the procedure. You should plan to take it easy, moving slowly for the rest of the day. You can resume normal activity the day after the procedure however YOU SHOULD NOT DRIVE, use power tools, machinery or perform tasks that involve climbing or major physical exertion for 24 hours (because of the sedation medicines used during the test).   SYMPTOMS TO REPORT IMMEDIATELY: A gastroenterologist can be reached at any hour. Please call [phone]  for any of the following symptoms:   Following upper endoscopy (EGD, EUS, ERCP, esophageal dilation) Vomiting  of blood or coffee ground material  New, significant abdominal pain  New, significant chest pain or pain under the shoulder blades  Painful or persistently difficult swallowing  New shortness of breath  Black, tarry-looking or red, bloody stools  FOLLOW UP:  If any biopsies were taken you will be contacted by phone or by letter within the next 1-3 weeks. Call [phone]  if you have not heard about the biopsies in 3 weeks.  Please also call with any specific questions about appointments or follow up tests.

## 2020-02-07 NOTE — Op Note (Signed)
[facility] Patient Name: Christina Ortiz Procedure Date : 02/07/2020 MRN: [id number] Attending MD: Justice Britain , MD Date of Birth: 22-Jun-1929 CSN: [id number] Age: 86 Admit Type: Inpatient Procedure:                ERCP Indications:              Bile duct stone(s), Stent change Providers:                Justice Britain, MD, Carlyn Reichert, RN, Lazaro Arms, Technician, Corie Chiquito, Technician, Vicie Mutters, CRNA Referring MD:             Pricilla Riffle. Fuller Plan, MD, Milus Banister, MD Medicines:                General Anesthesia, Cipro 400 mg IV, Indomethacin                            100 mg PR, Glucagon 1 mg IV Complications:            No immediate complications. Estimated Blood Loss:     Estimated blood loss was minimal. Procedure:                Pre-Anesthesia Assessment:                           - Prior to the procedure, a History and Physical                            was performed, and patient medications and                            allergies were reviewed. The patient's tolerance of                            previous anesthesia was also reviewed. The risks                            and benefits of the procedure and the sedation                            options and risks were discussed with the patient.                            All questions were answered, and informed consent                            was obtained. Prior Anticoagulants: The patient has                            taken Xarelto (rivaroxaban), last dose was 2 days  prior to procedure. ASA Grade Assessment: III - A                            patient with severe systemic disease. After                            reviewing the risks and benefits, the patient was                            deemed in satisfactory condition to undergo the                            procedure.                           After  obtaining informed consent, the scope was                            passed under direct vision. Throughout the                            procedure, the patient's blood pressure, pulse, and                            oxygen saturations were monitored continuously. The                            [id number]-[id number] ([id number] Olympus duodenoscope was                            introduced through the mouth, and used to inject                            contrast into and used for direct visualization of                            the bile duct. The ERCP was technically difficult                            and complex due to challenging cannulation.                            Successful completion of the procedure was aided by                            performing the maneuvers documented (below) in this                            report. The patient tolerated the procedure. Scope In: Scope Out: Findings:      A scout film of the abdomen was obtained. Surgical clips, consistent       with a previous cholecystectomy, were seen in the area of the right       upper quadrant of the abdomen.  The upper GI tract was traversed under direct vision without detailed       examination. A large hiatal hernia was present. A J-shaped deformity was       found in the gastric body. A large amount of food (residue) was found in       the entire examined stomach. No gross lesions were noted in the duodenal       bulb. The major papilla was located entirely within a diverticulum as a       pantaloon diverticulum. A biliary sphincterotomy had been performed       previously. The sphincterotomy appeared open. One plastic biliary stent       originating in the biliary tree was emerging from the major papilla but       had migrated into the duodenum significantly. This stent was removed       from the biliary tree using a snare.      A short 0.035 inch Soft Jagwire was passed into the biliary tree. The       Autotome  sphincterotome was passed over the guidewire and the bile duct       was then deeply cannulated. Contrast was injected. I personally       interpreted the bile duct images. Ductal flow of contrast was adequate.       Image quality was adequate. Contrast extended to the hepatic ducts.       Opacification of the entire biliary tree except for the gallbladder was       successful. The maximum diameter of the ducts was 15 mm. The lower third       of the main bile duct, middle third of the main bile duct and upper       third of the main bile duct contained filling defects thought to be       stones and sludge, with 2 larger stones >10 mm in size fluoroscopically.       Dilation of the common bile duct distally as a sphincteroplasty with a       10-07-11 mm balloon (to a maximum balloon size of 12 mm) dilator was       successful for a total of 4-minutes. The biliary tree was swept with a       retrieval balloon starting at the middle third of the main bile duct.       Sludge was swept from the duct. A few stone fragments and sludge were       removed. Two larger stones remained and I did not want to cause them to       embed, so since 2 prior ERCPs had been unsuccessful at typical removal       techniques, decision was made to pursue possible EHL.      The bile duct was explored endoscopically using the SpyGlass direct       visualization system. It was quite difficult to get the scope into the       middle third of the biliary tree due to an angulated distal duct as had       previously been described. After some time, the SpyScope was advanced to       the bifurcation. Visibility with the scope was good. The middle third of       the main bile duct and upper third of the main bile duct contained two       stones, the largest of which  was 15 mm in diameter bust below the       bifurcation. The lower third of the main bile duct contained a localized       irregularity, I believe consistent with  the angulation distally.       Electrohydraulic lithotripsy was partially successful at stone       breakdown. We lost position and had some difficulty with replacing the       Spyscope into the biliary tree. Decision made to proceed with trawling.      The biliary tree was swept with a retrieval balloon starting at the       bifurcation. Sludge was swept from the duct. A few stone fragments were       removed. One stone was felt to be remaining. Decision made to complete       procedure today. One 10 Fr by 5 cm plastic biliary stent with a single       external flap and a single internal flap was placed into the common bile       duct to ensure adequate drainage. Bile flowed through the stent. The       stent was in good position.      A pancreatogram was not performed.      The duodenoscope was withdrawn from the patient. Impression:               - Large hiatal hernia. J-shaped deformity in the                            gastric body. A large amount of food (residue) in                            the stomach.                           - No gross lesions in the duodenal bulb.                           - The major papilla was located entirely within a                            diverticulum as a pantaloon diverticulum.                           - Prior biliary sphincterotomy appeared open.                           - One stent from the biliary tree was seen in the                            major papilla. This was removed.                           - A filling defects consistent with stones and                            sludge were seen on the cholangiogram.                           -  An irregularity was found in the lower third of                            the main bile duct likely angulation related.                           - Choledocholithiasis was found. EHL via Spyscope                            was pursued. This was partially successful.                            Sweeping  did not remove all the stone fragments; a                            stent was inserted to maintain adequate drainage. Recommendation:           - The patient will be observed post-procedure,                            until all discharge criteria are met.                           - Discharge patient to home.                           - Patient has a contact number available for                            emergencies. The signs and symptoms of potential                            delayed complications were discussed with the                            patient. Return to normal activities tomorrow.                            Written discharge instructions were provided to the                            patient.                           - Observe patient's clinical course.                           - Check liver enzymes (AST, ALT, alkaline                            phosphatase, bilirubin) in 1 week.                           - Watch for pancreatitis, bleeding, perforation,  and cholangitis.                           - Repeat ERCP in 2 months for retreatment.                           - May restart Xarelto on 2/13 (a full 48 hours                            after completion of today's procedure) in order to                            decrease risk of post-ERCP bleeding complications.                           - Ciprofloxacin 500 mg twice daily for 3-days to                            decrease risk of post-infectious complications from                            ERCP in setting of EHL.                           - The findings and recommendations were discussed                            with the patient.                           - The findings and recommendations were discussed                            with the patient's family. Procedure Code(s):        --- Professional ---                           [phone], Endoscopic retrograde                             cholangiopancreatography (ERCP); with removal and                            exchange of stent(s), biliary or pancreatic duct,                            including pre- and post-dilation and guide wire                            passage, when performed, including sphincterotomy,                            when performed, each stent exchanged                           [id number],  Endoscopic retrograde                            cholangiopancreatography (ERCP); with destruction                            of calculi, any method (eg, mechanical,                            electrohydraulic, lithotripsy)                           [id number], Endoscopic cannulation of papilla with                            direct visualization of pancreatic/common bile                            duct(s) (List separately in addition to code(s) for                            primary procedure) Diagnosis Code(s):        --- Professional ---                           K44.9, Diaphragmatic hernia without obstruction or                            gangrene                           K31.89, Other diseases of stomach and duodenum                           Z96.89, Presence of other specified functional                            implants                           R93.2, Abnormal findings on diagnostic imaging of                            liver and biliary tract                           K80.50, Calculus of bile duct without cholangitis                            or cholecystitis without obstruction                           Z46.59, Encounter for fitting and adjustment of                            other gastrointestinal appliance and device CPT copyright 2019 [employer]. All rights reserved. The codes documented in this report are preliminary and upon  coder review may  be revised to meet current compliance requirements. Justice Britain, MD 02/07/2020 9:55:00 AM Number of Addenda: 0

## 2020-02-07 NOTE — Anesthesia Postprocedure Evaluation (Signed)
Anesthesia Post Note  Patient: Christina Ortiz  Procedure(s) Performed: ENDOSCOPIC RETROGRADE CHOLANGIOPANCREATOGRAPHY (ERCP) WITH PROPOFOL (N/A ) SPYGLASS CHOLANGIOSCOPY (N/A ) SPYGLASS LITHOTRIPSY (N/A ) STENT REMOVAL BILIARY DILATION REMOVAL OF STONES BILIARY STENT PLACEMENT     Patient location during evaluation: Endoscopy Anesthesia Type: General Level of consciousness: awake and alert Pain management: pain level controlled Vital Signs Assessment: post-procedure vital signs reviewed and stable Respiratory status: spontaneous breathing, nonlabored ventilation, respiratory function stable and patient connected to nasal cannula oxygen Cardiovascular status: blood pressure returned to baseline and stable Postop Assessment: no apparent nausea or vomiting Anesthetic complications: no    Last Vitals:  Vitals:   02/07/20 1030 02/07/20 1040  BP: 136/88 138/79  Pulse: (!) 57 (!) 59  Resp: 16 17  Temp:    SpO2: 91% 90%    Last Pain:  Vitals:   02/07/20 1040  TempSrc:   PainSc: 0-No pain                 Jakeisha Stricker

## 2020-02-09 ENCOUNTER — Ambulatory Visit: Payer: Medicare PPO | Attending: Internal Medicine

## 2020-02-09 DIAGNOSIS — Z23 Encounter for immunization: Secondary | ICD-10-CM | POA: Insufficient documentation

## 2020-02-09 NOTE — Progress Notes (Signed)
   Covid-19 Vaccination Clinic  Name:  Christina Ortiz    MRN: [id number] DOB: 08-19-1929  02/09/2020  [name] was observed post Covid-19 immunization for 15 minutes without incidence. She was provided with Vaccine Information Sheet and instruction to access the V-Safe system.   [name] was instructed to call 911 with any severe reactions post vaccine: Marland Kitchen Difficulty breathing  . Swelling of your face and throat  . A fast heartbeat  . A bad rash all over your body  . Dizziness and weakness    Immunizations Administered    Name Date Dose VIS Date Route   Pfizer COVID-19 Vaccine 02/09/2020 11:12 AM 0.3 mL 12/07/2019 Intramuscular   Manufacturer: [employer], [employer]   Lot: [id number]   NDC: [address]-4

## 2020-02-10 ENCOUNTER — Ambulatory Visit: Payer: Medicare PPO

## 2020-02-15 ENCOUNTER — Telehealth: Payer: Self-pay | Admitting: Gastroenterology

## 2020-02-15 ENCOUNTER — Other Ambulatory Visit (INDEPENDENT_AMBULATORY_CARE_PROVIDER_SITE_OTHER): Payer: Medicare PPO

## 2020-02-15 DIAGNOSIS — Z4889 Encounter for other specified surgical aftercare: Secondary | ICD-10-CM | POA: Diagnosis not present

## 2020-02-15 DIAGNOSIS — K831 Obstruction of bile duct: Secondary | ICD-10-CM

## 2020-02-15 LAB — COMPREHENSIVE METABOLIC PANEL
ALT: 5 U/L (ref 0–35)
AST: 16 U/L (ref 0–37)
Albumin: 3.7 g/dL (ref 3.5–5.2)
Alkaline Phosphatase: 82 U/L (ref 39–117)
BUN: 24 mg/dL — ABNORMAL HIGH (ref 6–23)
CO2: 30 mEq/L (ref 19–32)
Calcium: 9.2 mg/dL (ref 8.4–10.5)
Chloride: 103 mEq/L (ref 96–112)
Creatinine, Ser: 1.08 mg/dL (ref 0.40–1.20)
GFR: 47.84 mL/min — ABNORMAL LOW (ref >60.00)
Glucose, Bld: 90 mg/dL (ref 70–99)
Potassium: 3.5 mEq/L (ref 3.5–5.1)
Sodium: 141 mEq/L (ref 135–145)
Total Bilirubin: 1 mg/dL (ref 0.2–1.2)
Total Protein: 7.2 g/dL (ref 6.0–8.3)

## 2020-02-15 NOTE — Telephone Encounter (Signed)
The pt has been advised to come in for labs and recall in Epic for repeat procedure.

## 2020-02-25 ENCOUNTER — Other Ambulatory Visit: Payer: Self-pay | Admitting: Adult Health

## 2020-02-28 ENCOUNTER — Emergency Department (HOSPITAL_COMMUNITY): Payer: Medicare PPO

## 2020-02-28 ENCOUNTER — Emergency Department (HOSPITAL_COMMUNITY)
Admission: EM | Admit: 2020-02-28 | Discharge: 2020-02-28 | Disposition: A | Discharge status: Home / Self Care | Payer: Medicare PPO | Attending: Emergency Medicine | Admitting: Emergency Medicine

## 2020-02-28 ENCOUNTER — Encounter (HOSPITAL_COMMUNITY): Payer: Self-pay

## 2020-02-28 ENCOUNTER — Other Ambulatory Visit: Payer: Self-pay

## 2020-02-28 DIAGNOSIS — R52 Pain, unspecified: Secondary | ICD-10-CM | POA: Diagnosis not present

## 2020-02-28 DIAGNOSIS — Z955 Presence of coronary angioplasty implant and graft: Secondary | ICD-10-CM | POA: Diagnosis not present

## 2020-02-28 DIAGNOSIS — S72141A Displaced intertrochanteric fracture of right femur, initial encounter for closed fracture: Secondary | ICD-10-CM | POA: Diagnosis not present

## 2020-02-28 DIAGNOSIS — E039 Hypothyroidism, unspecified: Secondary | ICD-10-CM | POA: Diagnosis not present

## 2020-02-28 DIAGNOSIS — Y999 Unspecified external cause status: Secondary | ICD-10-CM | POA: Insufficient documentation

## 2020-02-28 DIAGNOSIS — Y929 Unspecified place or not applicable: Secondary | ICD-10-CM | POA: Diagnosis not present

## 2020-02-28 DIAGNOSIS — S79911A Unspecified injury of right hip, initial encounter: Secondary | ICD-10-CM | POA: Diagnosis not present

## 2020-02-28 DIAGNOSIS — I1310 Hypertensive heart and chronic kidney disease without heart failure, with stage 1 through stage 4 chronic kidney disease, or unspecified chronic kidney disease: Secondary | ICD-10-CM | POA: Insufficient documentation

## 2020-02-28 DIAGNOSIS — R0902 Hypoxemia: Secondary | ICD-10-CM | POA: Diagnosis not present

## 2020-02-28 DIAGNOSIS — Z87891 Personal history of nicotine dependence: Secondary | ICD-10-CM | POA: Insufficient documentation

## 2020-02-28 DIAGNOSIS — N189 Chronic kidney disease, unspecified: Secondary | ICD-10-CM | POA: Diagnosis not present

## 2020-02-28 DIAGNOSIS — W19XXXA Unspecified fall, initial encounter: Secondary | ICD-10-CM | POA: Insufficient documentation

## 2020-02-28 DIAGNOSIS — Z79899 Other long term (current) drug therapy: Secondary | ICD-10-CM | POA: Insufficient documentation

## 2020-02-28 DIAGNOSIS — Z7901 Long term (current) use of anticoagulants: Secondary | ICD-10-CM | POA: Diagnosis not present

## 2020-02-28 DIAGNOSIS — Y939 Activity, unspecified: Secondary | ICD-10-CM | POA: Diagnosis not present

## 2020-02-28 DIAGNOSIS — M25551 Pain in right hip: Secondary | ICD-10-CM | POA: Insufficient documentation

## 2020-02-28 DIAGNOSIS — I10 Essential (primary) hypertension: Secondary | ICD-10-CM | POA: Diagnosis not present

## 2020-02-28 DIAGNOSIS — I2510 Atherosclerotic heart disease of native coronary artery without angina pectoris: Secondary | ICD-10-CM | POA: Diagnosis not present

## 2020-02-28 MED ORDER — HYDROCODONE-ACETAMINOPHEN 5-325 MG PO TABS: 1.0000 | ORAL_TABLET | Freq: Four times a day (QID) | ORAL | 0 refills | Status: DC | PRN

## 2020-02-28 MED ORDER — OXYCODONE-ACETAMINOPHEN 5-325 MG PO TABS
1.0000 | ORAL_TABLET | Freq: Once | ORAL | Status: AC
  Administered 2020-02-28: 1 via ORAL
  Filled 2020-02-28: qty 1

## 2020-02-28 NOTE — ED Notes (Signed)
An After Visit Summary was printed and given to the patient. Discharge instructions given and no further questions at this time.  

## 2020-02-28 NOTE — Discharge Instructions (Signed)
Use a walker to ambulate with only if you are not having severe pain on that leg.  Take pain medicine as needed follow-up with Dr. Linna Caprice next week

## 2020-02-28 NOTE — ED Notes (Signed)
Christina Ortiz daughter [phone]. Would like an update.

## 2020-02-28 NOTE — Progress Notes (Signed)
Called by EDP for R hip injury, occurred yesterday. Imaging reviewed - has nondisplaced R greater trochanter avulsion fracture. This is a nonoperative injury. Can WBAT with walker. No active abduction. Follow up with me in 2-3 weeks for repeat x-rays.

## 2020-02-28 NOTE — ED Provider Notes (Signed)
[facility]-EMERGENCY DEPT Provider Note   CSN: [id number] Arrival date & time: 02/28/20  1750     History Chief Complaint  Patient presents with  . Fall    Christina Ortiz is a 86 y.o. female.  Patient states that she fell yesterday on her right hip.  She did not hit her head.  She was able to ambulate yesterday but today she cannot walk.  The history is provided by the patient. No language interpreter was used.  Fall This is a new problem. The current episode started 12 to 24 hours ago. The problem occurs rarely. The problem has been resolved. Pertinent negatives include no chest pain, no abdominal pain and no headaches. Nothing aggravates the symptoms. Nothing relieves the symptoms. She has tried nothing for the symptoms. The treatment provided no relief.       Past Medical History:  Diagnosis Date  . Acute bronchitis 10/2017  . Arthritis   . CKD (chronic kidney disease)    Xarelto dose is 15 mg QD  . Coronary artery disease    a. s/p PCI/stenting to RCA and LAD ([facility] in [place]) in 2006; b. Nuc 1/15: No ischemia, EF 70%  . Depression   . Gallstones   . Gastritis   . GERD (gastroesophageal reflux disease)   . Hiatal hernia   . Hip fracture (HCC)   . History of cholecystectomy   . History of echocardiogram    a. Echo 1/15: Moderate LVH, EF 55-60%, impaired relaxation, mild AS  . Hyperlipidemia   . Hypertension   . Hypokalemia   . Hypothyroidism   . Mobitz type 1 second degree atrioventricular block    a. Event Monitor 3/15: NSR, first-degree AV block, second-degree AB block - Mobitz 1, PACs, NSVT (5 beats)  . PAF (paroxysmal atrial fibrillation) (HCC)   . Thyroid disease     Patient Active Problem List   Diagnosis Date Noted  . Surgical counseling visit 12/10/2019  . History of esophageal stricture 11/02/2019  . Muscular deconditioning 11/01/2019  . Elevated LFTs   . Choledocholithiasis 10/16/2019  . Abnormal urinalysis  09/28/2019  . Adult failure to thrive 09/28/2019  . Dysphagia 09/06/2018  . Pain   . Closed displaced fracture of greater trochanter of left femur (HCC) 06/04/2018  . Hypoxia 06/03/2018  . Fall 03/31/2018  . Humerus shaft fracture-right 03/31/2018  . Rhabdomyolysis 03/31/2018  . Hypothermia 03/31/2018  . Healthcare maintenance 01/17/2018  . Nocturia 01/17/2018  . Macrocytosis 11/22/2017  . Acute bronchitis due to respiratory syncytial virus (RSV) 11/09/2017  . Acute respiratory failure with hypoxia (HCC) 11/09/2017  . Hypertension 11/09/2017  . PAF (paroxysmal atrial fibrillation) (HCC) 11/09/2017  . Coronary artery disease 11/09/2017  . CKD (chronic kidney disease) stage 2, GFR 60-89 ml/min 11/09/2017  . Hypothyroidism 11/09/2017  . Dyspnea 11/08/2017  . Upper respiratory tract infection 11/08/2017  . Elevated serum creatinine 06/21/2017  . Edema of both ankles 06/21/2017  . Diarrhea 05/25/2017  . Hypokalemia 05/25/2017  . Alcohol abuse 05/25/2017  . Depression, recurrent (HCC) 03/30/2017  . Lumbago 03/30/2017  . Influenza with respiratory manifestation 01/22/2017  . Coronary artery disease due to lipid rich plaque 04/04/2015  . Essential hypertension 04/04/2015  . Hyperlipidemia 04/04/2015  . Former smoker 04/04/2015  . Chronic anticoagulation 04/04/2015    Past Surgical History:  Procedure Laterality Date  . ANGIOPLASTY     with sent placement  . BILIARY DILATION  10/18/2019   Procedure: BILIARY DILATION;  Surgeon:  Rachael Fee, MD;  Location: [facility];  Service: Endoscopy;;  . BILIARY DILATION  12/17/2019   Procedure: BILIARY DILATION;  Surgeon: [name], MD;  Location: [facility];  Service: Endoscopy;;  . BILIARY DILATION  02/07/2020   Procedure: BILIARY DILATION;  Surgeon: Lemar Lofty., MD;  Location: [facility]COPY;  Service: Gastroenterology;;  . BILIARY STENT PLACEMENT N/A 10/18/2019   Procedure: BILIARY STENT PLACEMENT;  Surgeon:  Rachael Fee, MD;  Location: [facility] ENDOSCOPY;  Service: Endoscopy;  Laterality: N/A;  . BILIARY STENT PLACEMENT N/A 12/17/2019   Procedure: BILIARY STENT PLACEMENT;  Surgeon: [name], MD;  Location: [facility] ENDOSCOPY;  Service: Endoscopy;  Laterality: N/A;  . BILIARY STENT PLACEMENT  02/07/2020   Procedure: BILIARY STENT PLACEMENT;  Surgeon: Meridee Score Netty Starring., MD;  Location: [facility] ENDOSCOPY;  Service: Gastroenterology;;  . BLEPHAROPLASTY    . BREAST LUMPECTOMY Left   . CARDIAC CATHETERIZATION    . CATARACT EXTRACTION Bilateral   . CHOLECYSTECTOMY    . DILATION AND CURETTAGE OF UTERUS    . ENDOSCOPIC RETROGRADE CHOLANGIOPANCREATOGRAPHY (ERCP) WITH PROPOFOL N/A 12/17/2019   Procedure: ENDOSCOPIC RETROGRADE CHOLANGIOPANCREATOGRAPHY (ERCP) WITH PROPOFOL;  Surgeon: [name], MD;  Location: [facility] ENDOSCOPY;  Service: Endoscopy;  Laterality: N/A;  . ENDOSCOPIC RETROGRADE CHOLANGIOPANCREATOGRAPHY (ERCP) WITH PROPOFOL N/A 02/07/2020   Procedure: ENDOSCOPIC RETROGRADE CHOLANGIOPANCREATOGRAPHY (ERCP) WITH PROPOFOL;  Surgeon: Meridee Score Netty Starring., MD;  Location: [facility] ENDOSCOPY;  Service: Gastroenterology;  Laterality: N/A;  . ERCP N/A 10/18/2019   Procedure: ENDOSCOPIC RETROGRADE CHOLANGIOPANCREATOGRAPHY (ERCP);  Surgeon: Rachael Fee, MD;  Location: [facility];  Service: Endoscopy;  Laterality: N/A;  . ESOPHAGOGASTRODUODENOSCOPY (EGD) WITH PROPOFOL N/A 12/17/2019   Procedure: ESOPHAGOGASTRODUODENOSCOPY (EGD) WITH PROPOFOL;  Surgeon: [name], MD;  Location: [facility] ENDOSCOPY;  Service: Endoscopy;  Laterality: N/A;  . KYPHOPLASTY N/A 01/03/2020   Procedure: KYPHOPLASTY LUMBAR TWO;  Surgeon: Venita Lick, MD;  Location: [facility] OR;  Service: Orthopedics;  Laterality: N/A;  . REMOVAL OF STONES  10/18/2019   Procedure: REMOVAL OF STONES;  Surgeon: Rachael Fee, MD;  Location: [facility] ENDOSCOPY;  Service: Endoscopy;;  . REMOVAL OF STONES  12/17/2019   Procedure: REMOVAL OF STONES;  Surgeon:  [name], MD;  Location: [facility] ENDOSCOPY;  Service: Endoscopy;;  . REMOVAL OF STONES  02/07/2020   Procedure: REMOVAL OF STONES;  Surgeon: Lemar Lofty., MD;  Location: [facility] ENDOSCOPY;  Service: Gastroenterology;;  . Gaspar Bidding DILATION N/A 12/17/2019   Procedure: Gaspar Bidding DILATION;  Surgeon: [name], MD;  Location: [facility] ENDOSCOPY;  Service: Endoscopy;  Laterality: N/A;  . SPHINCTEROTOMY  10/18/2019   Procedure: SPHINCTEROTOMY;  Surgeon: Rachael Fee, MD;  Location: [facility];  Service: Endoscopy;;  . Dennison Mascot  12/17/2019   Procedure: Dennison Mascot;  Surgeon: [name], MD;  Location: [facility] ENDOSCOPY;  Service: Endoscopy;;  . Burman Freestone CHOLANGIOSCOPY N/A 02/07/2020   Procedure: [id number] CHOLANGIOSCOPY;  Surgeon: Lemar Lofty., MD;  Location: [facility] ENDOSCOPY;  Service: Gastroenterology;  Laterality: N/A;  Burman Freestone LITHOTRIPSY N/A 02/07/2020   Procedure: [id number] LITHOTRIPSY;  Surgeon: Lemar Lofty., MD;  Location: [facility] ENDOSCOPY;  Service: Gastroenterology;  Laterality: N/A;  . STENT REMOVAL  12/17/2019   Procedure: STENT REMOVAL;  Surgeon: [name], MD;  Location: [facility] ENDOSCOPY;  Service: Endoscopy;;  . STENT REMOVAL  02/07/2020   Procedure: STENT REMOVAL;  Surgeon: Lemar Lofty., MD;  Location: [facility] ENDOSCOPY;  Service: Gastroenterology;;     OB History   No obstetric history on  file.     Family History  Problem Relation Age of Onset  . Heart disease Mother   . Heart attack Mother   . Lung cancer Father   . Lymphoma Brother   . Heart failure Brother   . Esophageal cancer Paternal Uncle   . Colon cancer Neg Hx   . Colon polyps Neg Hx   . Rectal cancer Neg Hx   . Stomach cancer Neg Hx     Social History   Tobacco Use  . Smoking status: Former Smoker    Packs/day: 0.50    Years: 67.00    Pack years: 33.50    Types: Cigarettes    Quit date: 10/24/2014    Years since quitting: 5.3  . Smokeless tobacco: Never Used    . Tobacco comment: quit 2015  Substance Use Topics  . Alcohol use: Yes    Alcohol/week: 10.0 - 14.0 standard drinks    Types: 10 - 14 Shots of liquor per week    Comment: vodka  . Drug use: No    Home Medications Prior to Admission medications   Medication Sig Start Date End Date Taking? Authorizing Provider  acetaminophen (TYLENOL) 325 MG tablet Take 650 mg by mouth every 6 (six) hours as needed for moderate pain (for pain.).    Yes [provider]  amLODipine (NORVASC) 5 MG tablet Take 1 tablet (5 mg total) by mouth daily. Patient taking differently: Take 5 mg by mouth at bedtime.  08/03/19 08/02/20 Yes Jake Bathe, MD  antiseptic oral rinse (BIOTENE) LIQD 15 mLs by Mouth Rinse route daily as needed for dry mouth.    Yes [provider]  CALCIUM-MAGNESIUM-ZINC PO Take 1 tablet by mouth every morning.   Yes [provider]  folic acid (FOLVITE) 1 MG tablet TAKE 1 TABLET BY MOUTH DAILY Patient taking differently: Take 1 mg by mouth every evening.  09/04/19  Yes Danford, Orpha Bur D, NP  furosemide (LASIX) 20 MG tablet Take 20 mg by mouth daily.  01/21/20  Yes [provider]  levothyroxine (SYNTHROID) 75 MCG tablet TAKE 1 TABLET BY MOUTH DAILY BEFORE BREAKFAST. Patient taking differently: Take 75 mcg by mouth daily before breakfast.  01/15/20  Yes Danford, Orpha Bur D, NP  metoprolol tartrate (LOPRESSOR) 25 MG tablet TAKE 1/2 TABLET BY MOUTH TWICE A DAY. Patient taking differently: Take 12.5 mg by mouth 2 (two) times daily.  02/01/20  Yes Danford, Orpha Bur D, NP  omeprazole (PRILOSEC) 20 MG capsule TAKE 1 CAPSULE BY MOUTH DAILY AS NEEDED FOR HEARTBURN. Patient taking differently: Take 20 mg by mouth at bedtime.  09/04/19  Yes Danford, Orpha Bur D, NP  oxybutynin (DITROPAN-XL) 10 MG 24 hr tablet Take 10 mg by mouth every morning.  08/29/19  Yes [provider]  PARoxetine (PAXIL) 40 MG tablet TAKE 1 TABLET (40 MG TOTAL) BY MOUTH DAILY. Patient taking differently: Take 40  mg by mouth at bedtime.  01/28/20  Yes Danford, Orpha Bur D, NP  polyethylene glycol (MIRALAX / GLYCOLAX) packet Take 17 g by mouth daily.    Yes [provider]  potassium chloride SA (K-DUR) 20 MEQ tablet Take 1 tablet (20 mEq total) by mouth daily. 08/10/19  Yes Jake Bathe, MD  Propylene Glycol (SYSTANE BALANCE) 0.6 % SOLN Place 1 drop into both eyes daily.   Yes [provider]  quinapril (ACCUPRIL) 40 MG tablet TAKE 1 TABLET BY MOUTH DAILY. Patient taking differently: Take 40 mg by mouth daily.  02/25/20  Yes Opalski, Deborah, DO  Rivaroxaban (XARELTO) 15 MG TABS tablet Take 15 mg by mouth every evening.   Yes [provider]  simvastatin (ZOCOR) 5 MG tablet TAKE 1 TABLET BY MOUTH EVERY EVENING. Patient taking differently: Take 5 mg by mouth every evening.  01/21/20  Yes Danford, Orpha Bur D, NP  spironolactone (ALDACTONE) 25 MG tablet Take 0.5 tablets (12.5 mg total) by mouth daily. 12/31/19  Yes Jake Bathe, MD  VITAMIN D PO Take 1 tablet by mouth at bedtime.   Yes [provider]  HYDROcodone-acetaminophen (NORCO/VICODIN) 5-325 MG tablet Take 1 tablet by mouth every 6 (six) hours as needed for moderate pain. 02/28/20   Bethann Berkshire, MD    Allergies    Tape  Review of Systems   Review of Systems  Constitutional: Negative for appetite change and fatigue.  HENT: Negative for congestion, ear discharge and sinus pressure.   Eyes: Negative for discharge.  Respiratory: Negative for cough.   Cardiovascular: Negative for chest pain.  Gastrointestinal: Negative for abdominal pain and diarrhea.  Genitourinary: Negative for frequency and hematuria.  Musculoskeletal: Negative for back pain.       Right hip pain  Skin: Negative for rash.  Neurological: Negative for seizures and headaches.  Psychiatric/Behavioral: Negative for hallucinations.    Physical Exam Updated Vital Signs BP (!) 149/83   Pulse 69   Temp 98.1 F (36.7 C) (Oral)   Resp 18   LMP  (LMP  Unknown)   SpO2 99%   Physical Exam Vitals and nursing note reviewed.  Constitutional:      Appearance: She is well-developed.  HENT:     Head: Normocephalic.     Nose: Nose normal.  Eyes:     General: No scleral icterus.    Conjunctiva/sclera: Conjunctivae normal.  Neck:     Thyroid: No thyromegaly.  Cardiovascular:     Rate and Rhythm: Normal rate and regular rhythm.     Heart sounds: No murmur. No friction rub. No gallop.   Pulmonary:     Breath sounds: No stridor. No wheezing or rales.  Chest:     Chest wall: No tenderness.  Abdominal:     General: There is no distension.     Tenderness: There is no abdominal tenderness. There is no rebound.  Musculoskeletal:     Cervical back: Neck supple.     Comments: Tender right hip.  Lymphadenopathy:     Cervical: No cervical adenopathy.  Skin:    Findings: No erythema or rash.  Neurological:     Mental Status: She is alert and oriented to person, place, and time.     Motor: No abnormal muscle tone.     Coordination: Coordination normal.  Psychiatric:        Behavior: Behavior normal.     ED Results / Procedures / Treatments   Labs (all labs ordered are listed, but only abnormal results are displayed) Labs Reviewed - No data to display  EKG None  Radiology CT Hip Right Wo Contrast  Result Date: 02/28/2020 CLINICAL DATA:  Fall yesterday, right hip pain EXAM: CT OF THE RIGHT HIP WITHOUT CONTRAST TECHNIQUE: Multidetector CT imaging of the right hip was performed according to the standard protocol. Multiplanar CT image reconstructions were also generated. COMPARISON:  CT abdomen pelvis 10/16/2019 FINDINGS: Bones/Joint/Cartilage The osseous structures appear diffusely demineralized which may limit detection of small or nondisplaced fractures. Minimally displaced fracture involving the greater trochanter may reflect an avulsion type injury of the  hip rotators. Remaining portions of the femur are intact. Femoral head is normally  located. Moderate degenerative changes noted about the right hip. Additional degenerative features at the SI joints and symphysis pubis. Ligaments Suboptimally assessed by CT. Muscles and Tendons No intramuscular hemorrhage. No retracted, torn tendons. No other acute musculotendinous injury is seen. Soft tissues Lateral soft tissue swelling is noted, mild. No large hematoma. Included portions of the pelvis demonstrate multiple surgical clips including tubal ligation clips. There is atherosclerotic calcification of the aorta and iliac arteries. Question a partially imaged left iliac artery aneurysm (3/1). No other acute abnormality is seen in the pelvis. IMPRESSION: Minimally displaced fracture involving the greater trochanter may reflect an avulsion type injury involving the hip rotators. Moderate degenerative changes in the right hip with additional degeneration at the SI joint and symphysis pubis. Aortic Atherosclerosis (ICD10-I70.0). Question a left common iliac artery aneurysm versus normal bifurcation on the superior most image of the axial stack. Electronically Signed   By: Lovena Le M.D.   On: 02/28/2020 20:20   DG HIP UNILAT WITH PELVIS 2-3 VIEWS RIGHT  Result Date: 02/28/2020 CLINICAL DATA:  Recent fall with right hip pain, initial encounter EXAM: DG HIP (WITH OR WITHOUT PELVIS) 3V RIGHT COMPARISON:  06/03/2018 FINDINGS: Pelvic ring is intact. Degenerative changes of the lumbar spine and hip joints are noted. Biliary ductal stent is seen. No acute fracture or dislocation is noted. IMPRESSION: Degenerative change without acute abnormality. Electronically Signed   By: Inez Catalina M.D.   On: 02/28/2020 19:04    Procedures Procedures (including critical care time)  Medications Ordered in ED Medications  oxyCODONE-acetaminophen (PERCOCET/ROXICET) 5-325 MG per tablet 1 tablet (1 tablet Oral Given 02/28/20 2156)    ED Course  I have reviewed the triage vital signs and the nursing  notes.  Pertinent labs & imaging results that were available during my care of the patient were reviewed by me and considered in my medical decision making (see chart for details).   Patient has a fracture of the greater trochanter looks like an avulsion.  I spoke with Dr. Lyla Glassing orthopedics.  And he felt like the patient could be discharged home with a walker pain medicine MDM Rules/Calculators/A&P                      Final Clinical Impression(s) / ED Diagnoses Final diagnoses:  Fall  Fall, initial encounter    Rx / DC Orders ED Discharge Orders         Ordered    HYDROcodone-acetaminophen (NORCO/VICODIN) 5-325 MG tablet  Every 6 hours PRN     02/28/20 2157           Milton Ferguson, MD 02/28/20 2204

## 2020-02-28 NOTE — ED Triage Notes (Signed)
Pt BIB EMS from home. Pt c/o falling yesterday around 1000. Pt states she did not hit her head, denies LOC, pt states she was able to get up and walk around afterwards. Pt daughter called EMS due to pt having pain, and not able to get up. Pt c/o pain right groin and right buttock area, right heel pain. Pt states she takes xarelto. Hx of A fib, HTN.   171/108 HR 80 95% RA

## 2020-02-28 NOTE — ED Notes (Signed)
[name], patients daughter, stated she is coming to pick up patient. Will assist patient to daughters car when she arrives.

## 2020-03-10 ENCOUNTER — Telehealth: Payer: Self-pay | Admitting: Adult Health

## 2020-03-10 DIAGNOSIS — S329XXA Fracture of unspecified parts of lumbosacral spine and pelvis, initial encounter for closed fracture: Secondary | ICD-10-CM | POA: Diagnosis not present

## 2020-03-10 DIAGNOSIS — M25551 Pain in right hip: Secondary | ICD-10-CM | POA: Diagnosis not present

## 2020-03-10 DIAGNOSIS — S72111A Displaced fracture of greater trochanter of right femur, initial encounter for closed fracture: Secondary | ICD-10-CM | POA: Diagnosis not present

## 2020-03-10 NOTE — Telephone Encounter (Signed)
Pharmacy sent refill request -- Per med asst  Appt/MWE required for Rx refills---Called pt's daughter left message listing options for (Tues) 4/13 @ 9:45 or 4pm on cellphone VMB.  --FYI to [facility] in case either call back.  --glh

## 2020-03-11 ENCOUNTER — Other Ambulatory Visit: Payer: Self-pay | Admitting: Adult Health

## 2020-03-26 ENCOUNTER — Other Ambulatory Visit: Payer: Self-pay | Admitting: Family Medicine

## 2020-03-28 ENCOUNTER — Encounter: Payer: Self-pay | Admitting: Family Medicine

## 2020-04-01 ENCOUNTER — Ambulatory Visit (INDEPENDENT_AMBULATORY_CARE_PROVIDER_SITE_OTHER): Payer: Medicare PPO | Admitting: Family Medicine

## 2020-04-01 ENCOUNTER — Encounter: Payer: Self-pay | Admitting: Family Medicine

## 2020-04-01 ENCOUNTER — Other Ambulatory Visit: Payer: Self-pay

## 2020-04-01 VITALS — Ht 60.0 in | Wt 143.0 lb

## 2020-04-01 DIAGNOSIS — Z0000 Encounter for general adult medical examination without abnormal findings: Secondary | ICD-10-CM | POA: Diagnosis not present

## 2020-04-01 DIAGNOSIS — E2839 Other primary ovarian failure: Secondary | ICD-10-CM

## 2020-04-01 DIAGNOSIS — Z23 Encounter for immunization: Secondary | ICD-10-CM

## 2020-04-01 DIAGNOSIS — Z122 Encounter for screening for malignant neoplasm of respiratory organs: Secondary | ICD-10-CM

## 2020-04-01 DIAGNOSIS — Z87891 Personal history of nicotine dependence: Secondary | ICD-10-CM | POA: Diagnosis not present

## 2020-04-01 DIAGNOSIS — Z780 Asymptomatic menopausal state: Secondary | ICD-10-CM | POA: Diagnosis not present

## 2020-04-01 MED ORDER — SHINGRIX 50 MCG/0.5ML IM SUSR: 0.5000 mL | Freq: Once | INTRAMUSCULAR | 0 refills | Status: AC

## 2020-04-01 NOTE — Progress Notes (Signed)
Subjective:   Christina Ortiz is a 86 y.o. female who presents for Medicare Annual (Subsequent) preventive examination.  HPI: Patient denies concerns today about depression, hearing, vision, memory. States "I don't really have any concerns." She has hearing aids, and last had them checked/interrogated six months ago. Quit smoking in 2015, with a 30+ pack-year history of smoking. She is accompanied today by her grandson, Sherron Flemings. He is a Engineer, civil (consulting) and runs a clinic in [place]. Regarding her feelings, patient feels her mood has been stable for the past several months. She doesn't feel worse than prior, and feels her mood is well controlled. Says "I very seldom get depressed." She continues management on Paxil and has no complaints at present. During appointment, patient's grandson notes that the patient struggles with drinking in addition to mood management, and wonders if there might be a medication like Wellbutrin that might be able to address some of the patient's cravings in addition to mood stabilizing, instead of Paxil. Patient states she doesn't drink every day. If she does drink, states she ends up drinking two drinks per day. "I don't buy the big bottles." Sometimes she goes to the store alone to purchase liquor. The patient's grandson doesn't think that she should be driving. Notes "I ride a motorcycle, and feel like she would kill me." Overall, patient does not experience difficulty feeding herself, getting dressed, transferring from toilet to bed, etc. Lucila Maine says "periodically she falls or has an incident that puts her in a situation where she can't do [those activities] for a while." Per grandson, the patient has a walker and a transport, but "she really refuses to use them." Patient's grandson believes that it has been established that the patient is osteoporotic, and "she is already doing stuff to manage that." He states "I think she's broken bones." He is unsure whether or not she has  experienced pathological fractures. He is unsure whether she has been managed on a bisphosphonate in the past.      Objective:     Vitals: Ht 5' (1.524 m)   Wt 143 lb (64.9 kg)   LMP  (LMP Unknown)   BMI 27.93 kg/m   Body mass index is 27.93 kg/m.  Advanced Directives 02/28/2020 02/07/2020 12/31/2019 12/17/2019 10/17/2019 10/16/2019 06/04/2018  Does Patient Have a Medical Advance Directive? Yes Yes Yes Yes Yes Yes Yes  Type of Estate agent of [place];Living will Healthcare Power of [place];Living will Healthcare Power of [place];Living will - Healthcare Power of [place];Living will Healthcare Power of [place];Living will Healthcare Power of Attorney  Does patient want to make changes to medical advance directive? No - Patient declined - - - No - Patient declined No - Patient declined No - Patient declined  Copy of Healthcare Power of Attorney in Chart? Yes - validated most recent copy scanned in chart (See row information) Yes - validated most recent copy scanned in chart (See row information) Yes - validated most recent copy scanned in chart (See row information) No - copy requested No - copy requested - Yes  Would patient like information on creating a medical advance directive? - - - - - - -    Tobacco Social History   Tobacco Use  Smoking Status Former Smoker  . Packs/day: 0.50  . Years: 67.00  . Pack years: 33.50  . Types: Cigarettes  . Quit date: 10/24/2014  . Years since quitting: 5.4  Smokeless Tobacco Never Used  Tobacco Comment   quit 2015  Counseling given: Not Answered Comment: quit 2015     Past Medical History:  Diagnosis Date  . Acute bronchitis 10/2017  . Arthritis   . CKD (chronic kidney disease)    Xarelto dose is 15 mg QD  . Coronary artery disease    a. s/p PCI/stenting to RCA and LAD ([facility] [place]) in 2006; b. Nuc 1/15: No ischemia, EF 70%  . Depression   . Gallstones   . Gastritis   . GERD  (gastroesophageal reflux disease)   . Hiatal hernia   . Hip fracture ([place])   . History of cholecystectomy   . History of echocardiogram    a. Echo 1/15: Moderate LVH, EF 55-60%, impaired relaxation, mild AS  . Hyperlipidemia   . Hypertension   . Hypokalemia   . Hypothyroidism   . Mobitz type 1 second degree atrioventricular block    a. Event Monitor 3/15: NSR, first-degree AV block, second-degree AB block - Mobitz 1, PACs, NSVT (5 beats)  . PAF (paroxysmal atrial fibrillation) ([place])   . Thyroid disease    Past Surgical History:  Procedure Laterality Date  . ANGIOPLASTY     with sent placement  . BILIARY DILATION  10/18/2019   Procedure: BILIARY DILATION;  Surgeon: Milus Banister, MD;  [facility];  Service: Endoscopy;;  . BILIARY DILATION  12/17/2019   Procedure: BILIARY DILATION;  Surgeon: Ladene Artist, MD;  [facility];  Service: Endoscopy;;  . BILIARY DILATION  02/07/2020   Procedure: BILIARY DILATION;  Surgeon: Irving Copas., MD;  Location: [place];  Service: Gastroenterology;;  . BILIARY STENT PLACEMENT N/A 10/18/2019   Procedure: BILIARY STENT PLACEMENT;  Surgeon: Milus Banister, MD;  [facility];  Service: Endoscopy;  Laterality: N/A;  . BILIARY STENT PLACEMENT N/A 12/17/2019   Procedure: BILIARY STENT PLACEMENT;  Surgeon: Ladene Artist, MD;  [facility];  Service: Endoscopy;  Laterality: N/A;  . BILIARY STENT PLACEMENT  02/07/2020   Procedure: BILIARY STENT PLACEMENT;  Surgeon: Rush Landmark Telford Nab., MD;  Location: [place];  Service: Gastroenterology;;  . BLEPHAROPLASTY    . BREAST LUMPECTOMY Left   . CARDIAC CATHETERIZATION    . CATARACT EXTRACTION Bilateral   . CHOLECYSTECTOMY    . DILATION AND CURETTAGE OF UTERUS    . ENDOSCOPIC RETROGRADE CHOLANGIOPANCREATOGRAPHY (ERCP) WITH PROPOFOL N/A 12/17/2019   Procedure: ENDOSCOPIC RETROGRADE CHOLANGIOPANCREATOGRAPHY (ERCP) WITH PROPOFOL;  Surgeon:  Ladene Artist, MD;  [facility];  Service: Endoscopy;  Laterality: N/A;  . ENDOSCOPIC RETROGRADE CHOLANGIOPANCREATOGRAPHY (ERCP) WITH PROPOFOL N/A 02/07/2020   Procedure: ENDOSCOPIC RETROGRADE CHOLANGIOPANCREATOGRAPHY (ERCP) WITH PROPOFOL;  Surgeon: Rush Landmark Telford Nab., MD;  Location: [place];  Service: Gastroenterology;  Laterality: N/A;  . ERCP N/A 10/18/2019   Procedure: ENDOSCOPIC RETROGRADE CHOLANGIOPANCREATOGRAPHY (ERCP);  Surgeon: Milus Banister, MD;  [facility];  Service: Endoscopy;  Laterality: N/A;  . ESOPHAGOGASTRODUODENOSCOPY (EGD) WITH PROPOFOL N/A 12/17/2019   Procedure: ESOPHAGOGASTRODUODENOSCOPY (EGD) WITH PROPOFOL;  Surgeon: Ladene Artist, MD;  [facility];  Service: Endoscopy;  Laterality: N/A;  . KYPHOPLASTY N/A 01/03/2020   Procedure: KYPHOPLASTY LUMBAR TWO;  Surgeon: Melina Schools, MD;  Location: [place];  Service: Orthopedics;  Laterality: N/A;  . REMOVAL OF STONES  10/18/2019   Procedure: REMOVAL OF STONES;  Surgeon: Milus Banister, MD;  [facility];  Service: Endoscopy;;  . REMOVAL OF STONES  12/17/2019   Procedure: REMOVAL OF STONES;  Surgeon: Ladene Artist, MD;  [facility];  Service: Endoscopy;;  . REMOVAL OF STONES  02/07/2020   Procedure: REMOVAL OF STONES;  Surgeon: Meridee Score Netty Starring., MD;  Location[facility];  Service: Gastroenterology;;  . Gaspar Bidding DILATION N/A 12/17/2019   Procedure: Gaspar Bidding DILATION;  Surgeon: [name], MD;  [facility];  Service: Endoscopy;  Laterality: N/A;  . SPHINCTEROTOMY  10/18/2019   Procedure: SPHINCTEROTOMY;  Surgeon: Rachael Fee, MD;  [facility];  Service: Endoscopy;;  . Dennison Mascot  12/17/2019   Procedure: Dennison Mascot;  Surgeon: [name], MD;  [facility];  Service: Endoscopy;;  . [place] CHOLANGIOSCOPY N/A 02/07/2020   Procedure: TUUEKCMK CHOLANGIOSCOPY;  Surgeon: Lemar Lofty., MD;   Location: [facility];  Service: Gastroenterology;  Laterality: N/A;  Burman [place]estone LITHOTRIPSY N/A 02/07/2020   Procedure: LKJZPHXT LITHOTRIPSY;  Surgeon: Lemar Lofty., MD;  Location: [facility] ENDOSCOPY;  Service: Gastroenterology;  Laterality: N/A;  . STENT REMOVAL  12/17/2019   Procedure: STENT REMOVAL;  Surgeon: [name], MD;  [facility];  Service: Endoscopy;;  . STENT REMOVAL  02/07/2020   Procedure: STENT REMOVAL;  Surgeon: Lemar Lofty., MD;  Location: [facility];  Service: Gastroenterology;;   Family History  Problem Relation Age of Onset  . Heart disease Mother   . Heart attack Mother   . Lung cancer Father   . Lymphoma Brother   . Heart failure Brother   . Esophageal cancer Paternal Uncle   . Colon cancer Neg Hx   . Colon polyps Neg Hx   . Rectal cancer Neg Hx   . Stomach cancer Neg Hx    Social History   Socioeconomic History  . Marital status: Widowed    Spouse name: Not on file  . Number of children: 1  . Years of education: Not on file  . Highest education level: Not on file  Occupational History  . Occupation: retired  Tobacco Use  . Smoking status: Former Smoker    Packs/day: 0.50    Years: 67.00    Pack years: 33.50    Types: Cigarettes    Quit date: 10/24/2014    Years since quitting: 5.4  . Smokeless tobacco: Never Used  . Tobacco comment: quit 2015  Substance and Sexual Activity  . Alcohol use: Yes    Alcohol/week: 10.0 - 14.0 standard drinks    Types: 10 - 14 Shots of liquor per week    Comment: vodka  . Drug use: No  . Sexual activity: Never    Birth control/protection: Post-menopausal  Other Topics Concern  . Not on file  Social History Narrative  . Not on file   Social Determinants of Health   Financial Resource Strain:   . Difficulty of Paying Living Expenses:   Food Insecurity:   . Worried About Programme researcher, broadcasting/film/video in the Last Year:   . Barista in the Last Year:   Transportation Needs:   .  Freight forwarder (Medical):   Marland Kitchen Lack of Transportation (Non-Medical):   Physical Activity:   . Days of Exercise per Week:   . Minutes of Exercise per Session:   Stress:   . Feeling of Stress :   Social Connections:   . Frequency of Communication with Friends and Family:   . Frequency of Social Gatherings with Friends and Family:   . Attends Religious Services:   . Active Member of Clubs or Organizations:   . Attends Banker Meetings:   .  Marital Status:     Outpatient Encounter Medications as of 04/01/2020  Medication Sig  . acetaminophen (TYLENOL) 325 MG tablet Take 650 mg by mouth every 6 (six) hours as needed for moderate pain (for pain.).   Marland Kitchen amLODipine (NORVASC) 5 MG tablet Take 1 tablet (5 mg total) by mouth daily. (Patient taking differently: Take 5 mg by mouth at bedtime. )  . antiseptic oral rinse (BIOTENE) LIQD 15 mLs by Mouth Rinse route daily as needed for dry mouth.   Marland Kitchen CALCIUM-MAGNESIUM-ZINC PO Take 1 tablet by mouth every morning.  . folic acid (FOLVITE) 1 MG tablet TAKE 1 TABLET BY MOUTH DAILY (Patient taking differently: Take 1 mg by mouth every evening. )  . furosemide (LASIX) 20 MG tablet Take 20 mg by mouth daily.   Marland Kitchen levothyroxine (SYNTHROID) 75 MCG tablet TAKE 1 TABLET BY MOUTH DAILY BEFORE BREAKFAST. (Patient taking differently: Take 75 mcg by mouth daily before breakfast. )  . metoprolol tartrate (LOPRESSOR) 25 MG tablet TAKE 1/2 TABLET BY MOUTH TWICE A DAY. (Patient taking differently: Take 12.5 mg by mouth 2 (two) times daily. )  . omeprazole (PRILOSEC) 20 MG capsule TAKE 1 CAPSULE BY MOUTH DAILY AS NEEDED FOR HEARTBURN. (Patient taking differently: Take 20 mg by mouth at bedtime. )  . oxybutynin (DITROPAN-XL) 10 MG 24 hr tablet Take 10 mg by mouth every morning.   Marland Kitchen PARoxetine (PAXIL) 40 MG tablet TAKE 1 TABLET BY MOUTH DAILY. NEED OFFICE VISIT FOR FURTHER REFILLS  . polyethylene glycol (MIRALAX / GLYCOLAX) packet Take 17 g by mouth daily.   .  potassium chloride SA (K-DUR) 20 MEQ tablet Take 1 tablet (20 mEq total) by mouth daily.  Marland Kitchen Propylene Glycol (SYSTANE BALANCE) 0.6 % SOLN Place 1 drop into both eyes daily.  . quinapril (ACCUPRIL) 40 MG tablet TAKE 1 TABLET BY MOUTH DAILY. (Patient taking differently: Take 40 mg by mouth daily. )  . Rivaroxaban (XARELTO) 15 MG TABS tablet Take 15 mg by mouth every evening.  . simvastatin (ZOCOR) 5 MG tablet TAKE 1 TABLET BY MOUTH EVERY EVENING. (Patient taking differently: Take 5 mg by mouth every evening. )  . spironolactone (ALDACTONE) 25 MG tablet Take 0.5 tablets (12.5 mg total) by mouth daily.  Marland Kitchen VITAMIN D PO Take 1 tablet by mouth at bedtime.  Marland Kitchen HYDROcodone-acetaminophen (NORCO/VICODIN) 5-325 MG tablet Take 1 tablet by mouth every 6 (six) hours as needed for moderate pain. (Patient not taking: Reported on 04/01/2020)  . Zoster Vaccine Adjuvanted [facility]) injection Inject 0.5 mLs into the muscle once for 1 dose.   No facility-administered encounter medications on file as of 04/01/2020.    Activities of Daily Living In your present state of health, do you have any difficulty performing the following activities: 04/01/2020 12/31/2019  Hearing? Y Y  Comment - has hearing aids  Vision? Y N  Difficulty concentrating or making decisions? N N  Walking or climbing stairs? Y N  Comment - "I don't have any to climb."  Dressing or bathing? N N  Doing errands, shopping? N N  Some recent data might be hidden    Patient Care Team: Julaine Fusi, NP as PCP - General (Family Medicine) Jake Bathe, MD as PCP - Cardiology (Cardiology) Mateo Flow, MD as Consulting Physician (Ophthalmology) [facility] Chilton Greathouse, MD as Consulting Physician (Pulmonary Disease)    Assessment:   This is a routine wellness examination for [place].  Exercise Activities and Dietary recommendations  Goals   None     Fall Risk Fall Risk  04/01/2020 03/30/2017  Falls in the  past year? 1 Yes  Number falls in past yr: 1 1  Injury with Fall? 1 Yes  Risk for fall due to : History of fall(s) -  Follow up Falls evaluation completed -   Is the patient's home free of loose throw rugs in walkways, pet beds, electrical cords, etc?   yes      Grab bars in the bathroom? yes      Handrails on the stairs?   yes      Adequate lighting?   yes  Timed Get Up and Go performed: Telehealth  Depression Screen PHQ 2/9 Scores 04/01/2020 11/01/2019 09/27/2019 07/16/2019  PHQ - 2 Score PHQ- 9 Score Exception Documentation - - - -     Cognitive Function     6CIT Screen 04/01/2020  What Year? 0 points  What month? 0 points  What time? 0 points  Count back from 20 0 points  Months in reverse 0 points  Repeat phrase 10 points  Total Score 10    Immunization History  Administered Date(s) Administered  . Fluad Quad(high Dose 65+) 11/01/2019  . Influenza, High Dose Seasonal PF 10/04/2017, 09/21/2018  . PFIZER SARS-COV-2 Vaccination 01/19/2020, 02/09/2020  . Pneumococcal Conjugate-13 10/17/2014  . Pneumococcal Polysaccharide-23 12/29/2007  . Tdap 03/31/2018    Qualifies for Shingles Vaccine?Ordered for pharmacy TDAP: UTD Pneumococcal: UTD  Screening Tests Health Maintenance  Topic Date Due  . INFLUENZA VACCINE  07/27/2020  . TETANUS/TDAP  03/31/2028  . DEXA SCAN  Completed  . PNA vac Low Risk Adult  Completed    Cancer Screenings: Lung: Low Dose CT Chest recommended if Age 62-80 years, 30 pack-year currently smoking OR have quit w/in 15years. Patient does qualify. Breast:  Up to date on Mammogram? N/A  Up to date of Bone Density/Dexa? No Colorectal: N/A  Additional Screenings:  Hepatitis C Screening: Declined     Plan:    Medicare Wellness 8:15 AM 04/01/2020 PLAN: - Reviewed need for up to date screenings and immunizations according to guidelines. - Need for shingles vaccination. Order placed today. - Reviewed patient's significant history  of smoking and discussed need for lung cancer screening CT. Order placed today. - Last DEXA obtained 04/07/2016. Need for repeat. Order placed today. - Patient has obtained her COVID-19 vaccination, with the last dose about a month ago. - Health counseling provided and all questions answered. - Per pt, her hearing aids were last checked six months ago. - STRONGLY advised pt to ambulate using an assistive device at all times, such as a walker or cane. - STRONGLY encouraged patient to reduce her alcohol intake to one drink per day. - Discussed that referrals were provided in the past to address patient's heavy alcohol use, including the option of outpatient rehabilitation, therapy/counseling, and follow-up with psychiatry. Pt declined these referrals. - Discussed option of referral to facility for evaluation for patient's fitness for driving. - Recommended follow-up visit in-person to evaluate current treatment plan for mood management. Encouraged grandson to be a part of patient's next office visit. - Patient is due for full fasting labs this July (2021).  I have personally reviewed and noted the following in the patient's chart:   . Medical and social history . Use of alcohol, tobacco or illicit drugs  . Current medications and supplements . Functional ability and status .  Nutritional status . Physical activity . Advanced directives . List of other physicians . Hospitalizations, surgeries, and ER visits in previous 12 months . Vitals . Screenings to include cognitive, depression, and falls . Referrals and appointments  In addition, I have reviewed and discussed with patient certain preventive protocols, quality metrics, and best practice recommendations. A written personalized care plan for preventive services as well as general preventive health recommendations were provided to patient.    Return for 1-1.5 month to review meds for mood and after dexa/ ct lung scan done as well.

## 2020-04-07 DIAGNOSIS — S72111A Displaced fracture of greater trochanter of right femur, initial encounter for closed fracture: Secondary | ICD-10-CM | POA: Diagnosis not present

## 2020-04-18 ENCOUNTER — Other Ambulatory Visit: Payer: Self-pay | Admitting: Adult Health

## 2020-04-18 ENCOUNTER — Other Ambulatory Visit: Payer: Self-pay | Admitting: Cardiology

## 2020-04-18 DIAGNOSIS — I10 Essential (primary) hypertension: Secondary | ICD-10-CM

## 2020-04-28 ENCOUNTER — Telehealth: Payer: Self-pay | Admitting: Gastroenterology

## 2020-04-28 NOTE — Telephone Encounter (Signed)
Patient's daughter calling to schedule ERCP

## 2020-04-29 ENCOUNTER — Other Ambulatory Visit: Payer: Self-pay | Admitting: Adult Health

## 2020-04-29 DIAGNOSIS — H353132 Nonexudative age-related macular degeneration, bilateral, intermediate dry stage: Secondary | ICD-10-CM | POA: Diagnosis not present

## 2020-04-29 DIAGNOSIS — Z961 Presence of intraocular lens: Secondary | ICD-10-CM | POA: Diagnosis not present

## 2020-04-29 DIAGNOSIS — H35033 Hypertensive retinopathy, bilateral: Secondary | ICD-10-CM | POA: Diagnosis not present

## 2020-04-30 NOTE — Telephone Encounter (Signed)
Dr Meridee Score you have no appts at the hospital until July is it ok to wait?  Please advise

## 2020-04-30 NOTE — Telephone Encounter (Signed)
OK to wait if she is doing OK. Please get on the books as soon as able for repeat ERCP. Thanks. GM

## 2020-04-30 NOTE — Telephone Encounter (Signed)
Left message on machine to call back  

## 2020-05-02 NOTE — Telephone Encounter (Signed)
Left message on machine to call back  

## 2020-05-05 ENCOUNTER — Other Ambulatory Visit: Payer: Self-pay | Admitting: Physician Assistant

## 2020-05-05 ENCOUNTER — Other Ambulatory Visit: Payer: Self-pay | Admitting: Adult Health

## 2020-05-05 NOTE — Telephone Encounter (Signed)
Left message on machine to call back  

## 2020-05-06 ENCOUNTER — Other Ambulatory Visit: Payer: Self-pay

## 2020-05-06 DIAGNOSIS — K831 Obstruction of bile duct: Secondary | ICD-10-CM

## 2020-05-06 NOTE — Telephone Encounter (Signed)
Pt's daughter was returning you call .  Please call [name] (daughter) [phone]

## 2020-05-06 NOTE — Telephone Encounter (Signed)
ERCP scheduled for 7/12 at [facility] at 845 am-COVID test also scheduled for 07/03/20, pt instructed and medications reviewed.  Patient instructions mailed to home.  Patient to call with any questions or concerns. Xarelto hold for 2 days prior to previous ok

## 2020-05-19 DIAGNOSIS — S72111A Displaced fracture of greater trochanter of right femur, initial encounter for closed fracture: Secondary | ICD-10-CM | POA: Diagnosis not present

## 2020-05-20 ENCOUNTER — Other Ambulatory Visit: Payer: Self-pay | Admitting: Cardiology

## 2020-05-20 NOTE — Telephone Encounter (Signed)
Age 86, weight 65kg, SCr 1.08 on 02/15/20, CrCl 52mL/min, afib indication, last OV Aug 2020

## 2020-05-30 ENCOUNTER — Other Ambulatory Visit: Payer: Self-pay | Admitting: Family Medicine

## 2020-06-09 ENCOUNTER — Telehealth: Payer: Self-pay | Admitting: Physician Assistant

## 2020-06-09 MED ORDER — QUINAPRIL HCL 40 MG PO TABS: 40.0000 mg | ORAL_TABLET | Freq: Every day | ORAL | 0 refills | Status: DC

## 2020-06-09 NOTE — Telephone Encounter (Signed)
#  60 day refill sent to requested pharmacy.   Per last office visit note patient needs apt for further medication refills.  Please call patient to schedule appointment. AS< CMA

## 2020-06-09 NOTE — Addendum Note (Signed)
Addended by: Sylvester Harder on: 06/09/2020 09:01 AM   Modules accepted: Orders

## 2020-06-09 NOTE — Telephone Encounter (Signed)
Patient is requesting a refill of her quinapril, if approved please send to [facility]

## 2020-06-13 ENCOUNTER — Other Ambulatory Visit: Payer: Self-pay

## 2020-06-13 ENCOUNTER — Other Ambulatory Visit: Payer: Self-pay | Admitting: Physician Assistant

## 2020-06-13 ENCOUNTER — Ambulatory Visit
Admission: RE | Admit: 2020-06-13 | Discharge: 2020-06-13 | Disposition: A | Discharge status: Home / Self Care | Payer: Medicare PPO | Source: Ambulatory Visit | Attending: Family Medicine | Admitting: Family Medicine

## 2020-06-13 DIAGNOSIS — M85832 Other specified disorders of bone density and structure, left forearm: Secondary | ICD-10-CM | POA: Diagnosis not present

## 2020-06-13 DIAGNOSIS — Z0000 Encounter for general adult medical examination without abnormal findings: Secondary | ICD-10-CM

## 2020-06-13 DIAGNOSIS — Z780 Asymptomatic menopausal state: Secondary | ICD-10-CM | POA: Diagnosis not present

## 2020-06-13 DIAGNOSIS — E2839 Other primary ovarian failure: Secondary | ICD-10-CM

## 2020-06-23 ENCOUNTER — Emergency Department (HOSPITAL_COMMUNITY)
Admission: EM | Admit: 2020-06-23 | Discharge: 2020-06-23 | Disposition: A | Discharge status: Home / Self Care | Payer: Medicare PPO | Attending: Emergency Medicine | Admitting: Emergency Medicine

## 2020-06-23 ENCOUNTER — Other Ambulatory Visit: Payer: Self-pay

## 2020-06-23 ENCOUNTER — Encounter (HOSPITAL_COMMUNITY): Payer: Self-pay

## 2020-06-23 ENCOUNTER — Emergency Department (HOSPITAL_COMMUNITY): Payer: Medicare PPO

## 2020-06-23 DIAGNOSIS — I480 Paroxysmal atrial fibrillation: Secondary | ICD-10-CM | POA: Diagnosis not present

## 2020-06-23 DIAGNOSIS — Y9289 Other specified places as the place of occurrence of the external cause: Secondary | ICD-10-CM | POA: Diagnosis not present

## 2020-06-23 DIAGNOSIS — N816 Rectocele: Secondary | ICD-10-CM | POA: Diagnosis not present

## 2020-06-23 DIAGNOSIS — M7918 Myalgia, other site: Secondary | ICD-10-CM | POA: Diagnosis not present

## 2020-06-23 DIAGNOSIS — E039 Hypothyroidism, unspecified: Secondary | ICD-10-CM | POA: Diagnosis not present

## 2020-06-23 DIAGNOSIS — Y9389 Activity, other specified: Secondary | ICD-10-CM | POA: Diagnosis not present

## 2020-06-23 DIAGNOSIS — Y999 Unspecified external cause status: Secondary | ICD-10-CM | POA: Insufficient documentation

## 2020-06-23 DIAGNOSIS — S72111A Displaced fracture of greater trochanter of right femur, initial encounter for closed fracture: Secondary | ICD-10-CM

## 2020-06-23 DIAGNOSIS — W1839XA Other fall on same level, initial encounter: Secondary | ICD-10-CM | POA: Insufficient documentation

## 2020-06-23 DIAGNOSIS — Z7901 Long term (current) use of anticoagulants: Secondary | ICD-10-CM | POA: Diagnosis not present

## 2020-06-23 DIAGNOSIS — N3941 Urge incontinence: Secondary | ICD-10-CM | POA: Diagnosis not present

## 2020-06-23 DIAGNOSIS — I129 Hypertensive chronic kidney disease with stage 1 through stage 4 chronic kidney disease, or unspecified chronic kidney disease: Secondary | ICD-10-CM | POA: Insufficient documentation

## 2020-06-23 DIAGNOSIS — S0990XA Unspecified injury of head, initial encounter: Secondary | ICD-10-CM | POA: Diagnosis not present

## 2020-06-23 DIAGNOSIS — N182 Chronic kidney disease, stage 2 (mild): Secondary | ICD-10-CM | POA: Diagnosis not present

## 2020-06-23 DIAGNOSIS — R8279 Other abnormal findings on microbiological examination of urine: Secondary | ICD-10-CM | POA: Diagnosis not present

## 2020-06-23 DIAGNOSIS — S3992XA Unspecified injury of lower back, initial encounter: Secondary | ICD-10-CM | POA: Diagnosis not present

## 2020-06-23 DIAGNOSIS — I2510 Atherosclerotic heart disease of native coronary artery without angina pectoris: Secondary | ICD-10-CM | POA: Diagnosis not present

## 2020-06-23 DIAGNOSIS — M47816 Spondylosis without myelopathy or radiculopathy, lumbar region: Secondary | ICD-10-CM | POA: Diagnosis not present

## 2020-06-23 DIAGNOSIS — Z87891 Personal history of nicotine dependence: Secondary | ICD-10-CM | POA: Diagnosis not present

## 2020-06-23 DIAGNOSIS — M545 Low back pain: Secondary | ICD-10-CM | POA: Diagnosis not present

## 2020-06-23 DIAGNOSIS — Z79899 Other long term (current) drug therapy: Secondary | ICD-10-CM | POA: Diagnosis not present

## 2020-06-23 DIAGNOSIS — Z8249 Family history of ischemic heart disease and other diseases of the circulatory system: Secondary | ICD-10-CM | POA: Insufficient documentation

## 2020-06-23 LAB — BASIC METABOLIC PANEL
Anion gap: 8 (ref 5–15)
BUN: 17 mg/dL (ref 8–23)
CO2: 27 mmol/L (ref 22–32)
Calcium: 9.7 mg/dL (ref 8.9–10.3)
Chloride: 107 mmol/L (ref 98–111)
Creatinine, Ser: 1.03 mg/dL — ABNORMAL HIGH (ref 0.44–1.00)
GFR calc Af Amer: 56 mL/min — ABNORMAL LOW (ref >60)
GFR calc non Af Amer: 48 mL/min — ABNORMAL LOW (ref >60)
Glucose, Bld: 119 mg/dL — ABNORMAL HIGH (ref 70–99)
Potassium: 4 mmol/L (ref 3.5–5.1)
Sodium: 142 mmol/L (ref 135–145)

## 2020-06-23 LAB — CBC
HCT: 42.3 % (ref 36.0–46.0)
Hemoglobin: 13.9 g/dL (ref 12.0–15.0)
MCH: 33.6 pg (ref 26.0–34.0)
MCHC: 32.9 g/dL (ref 30.0–36.0)
MCV: 102.2 fL — ABNORMAL HIGH (ref 80.0–100.0)
Platelets: 175 10*3/uL (ref 150–400)
RBC: 4.14 MIL/uL (ref 3.87–5.11)
RDW: 13 % (ref 11.5–15.5)
WBC: 7.7 10*3/uL (ref 4.0–10.5)
nRBC: 0 % (ref 0.0–0.2)

## 2020-06-23 MED ORDER — HYDROCODONE-ACETAMINOPHEN 5-325 MG PO TABS
1.0000 | ORAL_TABLET | ORAL | Status: AC
  Administered 2020-06-23: 1 via ORAL
  Filled 2020-06-23: qty 1

## 2020-06-23 NOTE — ED Notes (Signed)
Pt verbalizes understanding of DC instructions. Pt belongings returned and is assisted in [facility]  out of ED.   Signature pad not available

## 2020-06-23 NOTE — ED Provider Notes (Signed)
[facility]-EMERGENCY DEPT Provider Note   CSN: [id number] Arrival date & time: 06/23/20  1206     History Chief Complaint  Patient presents with  . Fall  . Back Pain    Christina Ortiz is a 86 y.o. female.  HPI   Patient presented to the ED after a fall. Patient was at a doctor's appointment and she was walking to the car. Patient has had some trouble with same gait in the past. Patient fell landing on her back.  She was using her cane but not her walker.  Patient was helped to her feet with the assistance of her family member and another individual but she was complaining of pain in her lower back. Patient is not sure if she hit her head but she does take anticoagulants. She continues have pain in her lower back and also more towards the right side.  Past Medical History:  Diagnosis Date  . Acute bronchitis 10/2017  . Arthritis   . CKD (chronic kidney disease)    Xarelto dose is 15 mg QD  . Coronary artery disease    a. s/p PCI/stenting to RCA and LAD ([facility] in [place]) in 2006; b. Nuc 1/15: No ischemia, EF 70%  . Depression   . Gallstones   . Gastritis   . GERD (gastroesophageal reflux disease)   . Hiatal hernia   . Hip fracture (HCC)   . History of cholecystectomy   . History of echocardiogram    a. Echo 1/15: Moderate LVH, EF 55-60%, impaired relaxation, mild AS  . Hyperlipidemia   . Hypertension   . Hypokalemia   . Hypothyroidism   . Mobitz type 1 second degree atrioventricular block    a. Event Monitor 3/15: NSR, first-degree AV block, second-degree AB block - Mobitz 1, PACs, NSVT (5 beats)  . PAF (paroxysmal atrial fibrillation) (HCC)   . Thyroid disease     Patient Active Problem List   Diagnosis Date Noted  . Surgical counseling visit 12/10/2019  . History of esophageal stricture 11/02/2019  . Muscular deconditioning 11/01/2019  . Abnormal liver function tests   . Common bile duct calculus 10/16/2019  . Abnormal  urinalysis 09/28/2019  . Adult failure to thrive syndrome 09/28/2019  . Dysphagia 09/06/2018  . Pain   . Closed fracture of femur, greater trochanter (HCC) 06/04/2018  . Hypoxia 06/03/2018  . Pain in right arm 04/10/2018  . Fall 03/31/2018  . Fracture of shaft of humerus 03/31/2018  . Disorder of skeletal muscle 03/31/2018  . Hypothermia 03/31/2018  . Healthcare maintenance 01/17/2018  . Nocturia 01/17/2018  . Macrocytosis 11/22/2017  . Acute bronchitis due to respiratory syncytial virus 11/09/2017  . Acute respiratory failure (HCC) 11/09/2017  . Hypertension 11/09/2017  . Paroxysmal atrial fibrillation (HCC) 11/09/2017  . Coronary artery disease 11/09/2017  . CKD (chronic kidney disease) stage 2, GFR 60-89 ml/min 11/09/2017  . Hypothyroidism 11/09/2017  . Dyspnea 11/08/2017  . Upper respiratory infection 11/08/2017  . Serum creatinine raised 06/21/2017  . Ankle edema 06/21/2017  . Diarrhea 05/25/2017  . Hypokalemia 05/25/2017  . Alcohol abuse 05/25/2017  . Recurrent depression (HCC) 03/30/2017  . Low back pain 03/30/2017  . Influenza 01/22/2017  . Coronary atherosclerosis 04/04/2015  . Hypertensive disorder 04/04/2015  . Hyperlipidemia 04/04/2015  . Ex-smoker 04/04/2015  . Chronic anticoagulation 04/04/2015    Past Surgical History:  Procedure Laterality Date  . ANGIOPLASTY     with sent placement  . BILIARY DILATION  10/18/2019   Procedure: BILIARY DILATION;  Surgeon: Milus Banister, MD;  Location: [facility];  Service: Endoscopy;;  . BILIARY DILATION  12/17/2019   Procedure: BILIARY DILATION;  Surgeon: Ladene Artist, MD;  Location: [facility];  Service: Endoscopy;;  . BILIARY DILATION  02/07/2020   Procedure: BILIARY DILATION;  Surgeon: Irving Copas., MD;  Location: [place];  Service: Gastroenterology;;  . BILIARY STENT PLACEMENT N/A 10/18/2019   Procedure: BILIARY STENT PLACEMENT;  Surgeon: Milus Banister, MD;  Location: [facility];   Service: Endoscopy;  Laterality: N/A;  . BILIARY STENT PLACEMENT N/A 12/17/2019   Procedure: BILIARY STENT PLACEMENT;  Surgeon: Ladene Artist, MD;  Location: [facility];  Service: Endoscopy;  Laterality: N/A;  . BILIARY STENT PLACEMENT  02/07/2020   Procedure: BILIARY STENT PLACEMENT;  Surgeon: Rush Landmark Telford Nab., MD;  Location: [place];  Service: Gastroenterology;;  . BLEPHAROPLASTY    . BREAST LUMPECTOMY Left   . CARDIAC CATHETERIZATION    . CATARACT EXTRACTION Bilateral   . CHOLECYSTECTOMY    . DILATION AND CURETTAGE OF UTERUS    . ENDOSCOPIC RETROGRADE CHOLANGIOPANCREATOGRAPHY (ERCP) WITH PROPOFOL N/A 12/17/2019   Procedure: ENDOSCOPIC RETROGRADE CHOLANGIOPANCREATOGRAPHY (ERCP) WITH PROPOFOL;  Surgeon: Ladene Artist, MD;  Location: [facility];  Service: Endoscopy;  Laterality: N/A;  . ENDOSCOPIC RETROGRADE CHOLANGIOPANCREATOGRAPHY (ERCP) WITH PROPOFOL N/A 02/07/2020   Procedure: ENDOSCOPIC RETROGRADE CHOLANGIOPANCREATOGRAPHY (ERCP) WITH PROPOFOL;  Surgeon: Rush Landmark Telford Nab., MD;  Location: [place];  Service: Gastroenterology;  Laterality: N/A;  . ERCP N/A 10/18/2019   Procedure: ENDOSCOPIC RETROGRADE CHOLANGIOPANCREATOGRAPHY (ERCP);  Surgeon: Milus Banister, MD;  Location: [facility];  Service: Endoscopy;  Laterality: N/A;  . ESOPHAGOGASTRODUODENOSCOPY (EGD) WITH PROPOFOL N/A 12/17/2019   Procedure: ESOPHAGOGASTRODUODENOSCOPY (EGD) WITH PROPOFOL;  Surgeon: Ladene Artist, MD;  [facility]: [facility];  Service: Endoscopy;  Laterality: N/A;  . KYPHOPLASTY N/A 01/03/2020   Procedure: KYPHOPLASTY LUMBAR TWO;  Surgeon: Melina Schools, MD;  Location: [place];  Service: Orthopedics;  Laterality: N/A;  . REMOVAL OF STONES  10/18/2019   Procedure: REMOVAL OF STONES;  Surgeon: Milus Banister, MD;  Location: [facility];  Service: Endoscopy;;  . REMOVAL OF STONES  12/17/2019   Procedure: REMOVAL OF STONES;  Surgeon: Ladene Artist, MD;  Location: [facility];   Service: Endoscopy;;  . REMOVAL OF STONES  02/07/2020   Procedure: REMOVAL OF STONES;  Surgeon: Irving Copas., MD;  Location: [place];  Service: Gastroenterology;;  . Azzie Almas DILATION N/A 12/17/2019   Procedure: Azzie Almas DILATION;  Surgeon: Ladene Artist, MD;  Location[facility];  Service: Endoscopy;  Laterality: N/A;  . SPHINCTEROTOMY  10/18/2019   Procedure: SPHINCTEROTOMY;  Surgeon: Milus Banister, MD;  Location: [facility];  Service: Endoscopy;;  . Joan Mayans  12/17/2019   Procedure: Joan Mayans;  Surgeon: Ladene Artist, MD;  Location: [facility];  Service: Endoscopy;;  . Bess Kinds CHOLANGIOSCOPY N/A 02/07/2020   Procedure: KXFGHWEX CHOLANGIOSCOPY;  Surgeon: Irving Copas., MD;  Location: [place];  Service: Gastroenterology;  Laterality: N/A;  Bess Kinds LITHOTRIPSY N/A 02/07/2020   Procedure: HBZJIRCV LITHOTRIPSY;  Surgeon: Irving Copas., MD;  Location: [place];  Service: Gastroenterology;  Laterality: N/A;  . STENT REMOVAL  12/17/2019   Procedure: STENT REMOVAL;  Surgeon: Ladene Artist, MD;  Location[facility];  Service: Endoscopy;;  . STENT REMOVAL  02/07/2020   Procedure: STENT REMOVAL;  Surgeon: Irving Copas., MD;  Location: [place];  Service: Gastroenterology;;  OB History   No obstetric history on file.     Family History  Problem Relation Age of Onset  . Heart disease Mother   . Heart attack Mother   . Lung cancer Father   . Lymphoma Brother   . Heart failure Brother   . Esophageal cancer Paternal Uncle   . Colon cancer Neg Hx   . Colon polyps Neg Hx   . Rectal cancer Neg Hx   . Stomach cancer Neg Hx     Social History   Tobacco Use  . Smoking status: Former Smoker    Packs/day: 0.50    Years: 67.00    Pack years: 33.50    Types: Cigarettes    Quit date: 10/24/2014    Years since quitting: 5.6  . Smokeless tobacco: Never Used  . Tobacco comment: quit 2015  Vaping Use  .  Vaping Use: Never used  Substance Use Topics  . Alcohol use: Yes    Alcohol/week: 10.0 - 14.0 standard drinks    Types: 10 - 14 Shots of liquor per week    Comment: vodka  . Drug use: No    Home Medications Prior to Admission medications   Medication Sig Start Date End Date Taking? Authorizing Provider  acetaminophen (TYLENOL) 325 MG tablet Take 650 mg by mouth every 6 (six) hours as needed for moderate pain (for pain.).     [provider]  amLODipine (NORVASC) 5 MG tablet TAKE 1 TABLET BY MOUTH DAILY. 04/18/20   Jake Bathe, MD  antiseptic oral rinse (BIOTENE) LIQD 15 mLs by Mouth Rinse route daily as needed for dry mouth.     [provider]  CALCIUM-MAGNESIUM-ZINC PO Take 1 tablet by mouth every morning.    [provider]  folic acid (FOLVITE) 1 MG tablet TAKE 1 TABLET BY MOUTH DAILY Patient taking differently: Take 1 mg by mouth every evening.  09/04/19   Danford, Orpha Bur D, NP  furosemide (LASIX) 20 MG tablet Take 20 mg by mouth daily.  01/21/20   [provider]  HYDROcodone-acetaminophen (NORCO/VICODIN) 5-325 MG tablet Take 1 tablet by mouth every 6 (six) hours as needed for moderate pain. Patient not taking: Reported on 04/01/2020 02/28/20   Bethann Berkshire, MD  levothyroxine (SYNTHROID) 75 MCG tablet TAKE 1 TABLET BY MOUTH DAILY BEFORE BREAKFAST. 04/21/20   Abonza, Maritza, PA-C  metoprolol tartrate (LOPRESSOR) 25 MG tablet TAKE 1/2 TABLET BY MOUTH TWICE A DAY. Patient taking differently: Take 12.5 mg by mouth 2 (two) times daily.  02/01/20   Danford, Orpha Bur D, NP  omeprazole (PRILOSEC) 20 MG capsule TAKE 1 CAPSULE BY MOUTH DAILY AS NEEDED FOR HEARTBURN. Patient taking differently: Take 20 mg by mouth at bedtime.  09/04/19   Danford, Orpha Bur D, NP  oxybutynin (DITROPAN-XL) 10 MG 24 hr tablet Take 10 mg by mouth every morning.  08/29/19   [provider]  PARoxetine (PAXIL) 40 MG tablet TAKE 1 TABLET BY MOUTH DAILY. NEED OFFICE VISIT FOR FURTHER REFILLS  03/26/20   Thomasene Lot, DO  polyethylene glycol (MIRALAX / GLYCOLAX) packet Take 17 g by mouth daily.     [provider]  potassium chloride SA (K-DUR) 20 MEQ tablet Take 1 tablet (20 mEq total) by mouth daily. 08/10/19   Jake Bathe, MD  Propylene Glycol (SYSTANE BALANCE) 0.6 % SOLN Place 1 drop into both eyes daily.    [provider]  quinapril (ACCUPRIL) 40 MG tablet Take 1 tablet (40 mg total)  by mouth daily. **PATIENT NEEDS APT FOR FURTHER REFILLS** 06/09/20   Mayer Masker, PA-C  simvastatin (ZOCOR) 5 MG tablet TAKE 1 TABLET BY MOUTH EVERY EVENING. 05/05/20   Mayer Masker, PA-C  spironolactone (ALDACTONE) 25 MG tablet Take 0.5 tablets (12.5 mg total) by mouth daily. 12/31/19   Jake Bathe, MD  VITAMIN D PO Take 1 tablet by mouth at bedtime.    [provider]  XARELTO 15 MG TABS tablet TAKE 1 TABLET BY MOUTH DAILY WITH SUPPER. 05/20/20   Jake Bathe, MD    Allergies    Tape  Review of Systems   Review of Systems  All other systems reviewed and are negative.   Physical Exam Updated Vital Signs BP (!) 146/88   Pulse (!) 57   Temp 98.1 F (36.7 C) (Oral)   Resp (!) 25   Ht 1.524 m (5')   Wt 59 kg   LMP  (LMP Unknown)   SpO2 95%   BMI 25.39 kg/m   Physical Exam Vitals and nursing note reviewed.  Constitutional:      General: She is not in acute distress.    Appearance: She is well-developed.     Comments: Elderly, frail  HENT:     Head: Normocephalic and atraumatic.     Right Ear: External ear normal.     Left Ear: External ear normal.  Eyes:     General: No scleral icterus.       Right eye: No discharge.        Left eye: No discharge.     Conjunctiva/sclera: Conjunctivae normal.  Neck:     Trachea: No tracheal deviation.  Cardiovascular:     Rate and Rhythm: Normal rate and regular rhythm.  Pulmonary:     Effort: Pulmonary effort is normal. No respiratory distress.     Breath sounds: Normal breath sounds. No stridor. No  wheezing or rales.  Abdominal:     General: Bowel sounds are normal. There is no distension.     Palpations: Abdomen is soft.     Tenderness: There is no abdominal tenderness. There is no guarding or rebound.  Musculoskeletal:        General: Tenderness present.     Cervical back: Normal and neck supple.     Thoracic back: Normal.     Lumbar back: Tenderness present.     Comments: Tenderness palpation lumbar spine and paraspinal region, mild tenderness palpation with pelvic compression, patient is able to lift both legs off the bed opening with some more discomfort on the right side  Skin:    General: Skin is warm and dry.     Findings: No rash.  Neurological:     Cranial Nerves: No cranial nerve deficit (no facial droop, extraocular movements intact, no slurred speech).     Sensory: No sensory deficit.     Motor: No abnormal muscle tone or seizure activity.     Coordination: Coordination normal.     ED Results / Procedures / Treatments   Labs (all labs ordered are listed, but only abnormal results are displayed) Labs Reviewed  CBC - Abnormal; Notable for the following components:      Result Value   MCV 102.2 (*)    All other components within normal limits  BASIC METABOLIC PANEL - Abnormal; Notable for the following components:   Glucose, Bld 119 (*)    Creatinine, Ser 1.03 (*)    GFR calc non Af Amer 48 (*)  GFR calc Af Amer 56 (*)    All other components within normal limits    EKG None  Radiology DG Lumbar Spine Complete  Result Date: 06/23/2020 CLINICAL DATA:  Recent fall with low back pain, initial encounter EXAM: LUMBAR SPINE - COMPLETE 4+ VIEW COMPARISON:  01/03/2020 FINDINGS: Changes of L2 augmentation are again seen and stable. Vertebral body height is otherwise well maintained. Multilevel osteophytic changes and facet hypertrophic changes are noted. Persistent anterolisthesis is noted of L4 on L5. Biliary stent is seen with evidence of pneumobilia. No other  focal abnormality is seen. IMPRESSION: Multilevel degenerative change in findings consistent with prior vertebral augmentation. Electronically Signed   By: Alcide Clever M.D.   On: 06/23/2020 13:43   CT Head Wo Contrast  Result Date: 06/23/2020 CLINICAL DATA:  Status post fall, low back pain, head trauma EXAM: CT HEAD WITHOUT CONTRAST TECHNIQUE: Contiguous axial images were obtained from the base of the skull through the vertex without intravenous contrast. COMPARISON:  None FINDINGS: Brain: No evidence of acute infarction, hemorrhage, hydrocephalus, extra-axial collection or mass lesion/mass effect. Vascular: No hyperdense vessel or unexpected calcification. Skull: No osseous abnormality. Sinuses/Orbits: Visualized paranasal sinuses are clear. Visualized mastoid sinuses are clear. Visualized orbits demonstrate no focal abnormality. Other: None IMPRESSION: No acute intracranial pathology. Electronically Signed   By: Elige Ko   On: 06/23/2020 13:53   DG Hips Bilat W or Wo Pelvis 3-4 Views  Result Date: 06/23/2020 CLINICAL DATA:  Bilateral hip pain following fall, initial encounter EXAM: DG HIP (WITH OR WITHOUT PELVIS) 4V BILAT COMPARISON:  02/28/2020 FINDINGS: Avulsion fracture of the greater trochanter on the right is noted. At least 2 bony fragments are seen. This is somewhat changed when compared with the prior CT examination as there is now displacement of the fracture fragments seen. Pelvic ring is intact. Degenerative changes of the hip joints are noted. No new fracture is seen. IMPRESSION: Displacement of fracture fragments from the right greater trochanter when compared with the prior CT examination. No other acute abnormality is noted. Electronically Signed   By: Alcide Clever M.D.   On: 06/23/2020 13:45    Procedures Procedures (including critical care time)  Medications Ordered in ED Medications  HYDROcodone-acetaminophen (NORCO/VICODIN) 5-325 MG per tablet 1 tablet (1 tablet Oral Given  06/23/20 1316)    ED Course  I have reviewed the triage vital signs and the nursing notes.  Pertinent labs & imaging results that were available during my care of the patient were reviewed by me and considered in my medical decision making (see chart for details).  Clinical Course as of Jun 23 1513  Mon Jun 23, 2020  1425 Labs unremarkable.  Head CT without acute findings   [JK]  1426 Hip shows displacement of the fracture fragments noted from her previous fall   [JK]  1438 Reviewed findings with the patient and her daughter.  We will see if she can ambulate    [JK]  1504 Pt was able to ambulate in the ED.  Ready for discharge   [JK]    Clinical Course User Index [JK] Linwood Dibbles, MD   MDM Rules/Calculators/A&P                          Patient presented to ED for evaluation after fall.  CT scanning of her head does not show any serious abnormalities.  Laboratory tests are otherwise unremarkable.  Plain films do not show any signs  of acute fracture.  There is increased displacement of a previous avulsion fracture.  Unclear if that is new be associated with today's fall.  However the patient is able to ambulate.  No indication for further CT or MRI imaging at this time.  Patient appears stable for discharge.  I did recommend she use her walker instead of her cane. Final Clinical Impression(s) / ED Diagnoses Final diagnoses:  Closed avulsion fracture of greater trochanter of right femur, initial encounter [facility])    Rx / DC Orders ED Discharge Orders    None       Linwood Dibbles, MD 06/23/20 1516

## 2020-06-23 NOTE — Discharge Instructions (Addendum)
Follow-up with your orthopedic doctor.  Take over-the-counter medications as needed for pain.  Recommend using your walker instead of your cane for support

## 2020-06-23 NOTE — ED Triage Notes (Signed)
Patient fell while getting a car. Patient c/o lower back pain. Patient states she does not remember if she hit or head or not. Patient is on blood thinners.

## 2020-06-23 NOTE — ED Notes (Signed)
Pt able to ambulate with walker and baseline assistance. Pt denies significant pain during this exercise.

## 2020-06-23 NOTE — ED Notes (Signed)
Pt transported to xray 

## 2020-07-02 ENCOUNTER — Other Ambulatory Visit: Payer: Self-pay | Admitting: Physician Assistant

## 2020-07-03 ENCOUNTER — Other Ambulatory Visit (HOSPITAL_COMMUNITY)
Admission: RE | Admit: 2020-07-03 | Discharge: 2020-07-03 | Disposition: A | Discharge status: Home / Self Care | Payer: Medicare PPO | Source: Ambulatory Visit | Attending: Gastroenterology | Admitting: Gastroenterology

## 2020-07-03 DIAGNOSIS — Z01812 Encounter for preprocedural laboratory examination: Secondary | ICD-10-CM | POA: Diagnosis not present

## 2020-07-03 DIAGNOSIS — Z20822 Contact with and (suspected) exposure to covid-19: Secondary | ICD-10-CM | POA: Diagnosis not present

## 2020-07-03 LAB — SARS CORONAVIRUS 2 (TAT 6-24 HRS): SARS Coronavirus 2: NEGATIVE

## 2020-07-04 ENCOUNTER — Encounter (HOSPITAL_COMMUNITY): Payer: Self-pay | Admitting: Vascular Surgery

## 2020-07-04 ENCOUNTER — Encounter (HOSPITAL_COMMUNITY): Payer: Self-pay | Admitting: Gastroenterology

## 2020-07-04 NOTE — Anesthesia Preprocedure Evaluation (Deleted)
Anesthesia Evaluation    Reviewed: Allergy & Precautions, Patient's Chart, lab work & pertinent test results  Airway        Dental   Pulmonary shortness of breath, former smoker,           Cardiovascular hypertension, Pt. on medications and Pt. on home beta blockers + CAD and + Cardiac Stents  + dysrhythmias Atrial Fibrillation + Valvular Problems/Murmurs AS  Rhythm:Irregular Rate:Normal  08/22/19 Echo  1. The left ventricle has normal systolic function, with an ejection  fraction of 55-60%. The cavity size was normal. Left ventricular diastolic  Doppler parameters are consistent with pseudonormalization.  2. The right ventricle has normal systolic function. The cavity was  normal. There is no increase in right ventricular wall thickness.  3. Left atrial size was severely dilated.  4. Right atrial size was mildly dilated.  5. Mild mitral valve prolapse.  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 Mild thickening of the mitral valve    Neuro/Psych Depression  Neuromuscular disease    GI/Hepatic hiatal hernia, GERD  Controlled and Medicated,  Endo/Other  Hypothyroidism   Renal/GU Renal InsufficiencyRenal diseaseCr 1.03     Musculoskeletal   Abdominal   Peds  Hematology Lab Results      Component                Value               Date                      WBC                      7.7                 06/23/2020                HGB                      13.9                06/23/2020                HCT                      42.3                06/23/2020                MCV                      102.2 (H)           06/23/2020                PLT                      175                 06/23/2020              Anesthesia Other Findings   Reproductive/Obstetrics                            Anesthesia Physical Anesthesia Plan  ASA: III  Anesthesia Plan: General   Post-op Pain Management:     Induction: Intravenous  PONV Risk Score and Plan: 3 and Treatment may  vary due to age or medical condition and Ondansetron  Airway Management Planned: Oral ETT  Additional Equipment: None  Intra-op Plan:   Post-operative Plan: Extubation in OR  Informed Consent:     Dental advisory given  Plan Discussed with:   Anesthesia Plan Comments: (Follows with cardiology for hx of CAD (s/p stents to LAD and RCA 2006, [place]), PAF, 2nd Degree AVB type 1 (2015 event monitor). Last seen by cardiology when she was cleared to undergo kyphoplasty which was done 01/04/20 without complication. Per clearance 11/19/19, "Given past medical history and time since last visit, based on ACC/AHA guidelines,Christina Rossodivitawould be at acceptable risk for the planned procedure without further cardiovascular testing. Followed by Dr. Anne Fu, last seen in August of 2020. Pharmacy has made recommendations to hold Xarelto for 3 days prior to procedure."  No interim health changes noted.   LD Xarelto 7/9  Will need DOS labs and eval.  EKG 01/04/20: Afib. Rate 68. Rightward axis. Septal infarct, age undetermined.  Echo 08/22/19: IMPRESSIONS 1. The left ventricle has normal systolic function, with an ejection fraction of 55-60%. The cavity size was normal. Left ventricular diastolic Doppler parameters are consistent with pseudonormalization. 2. The right ventricle has normal systolic function. The cavity was normal. There is no increase in right ventricular wall thickness. 3. Left atrial size was severely dilated. 4. Right atrial size was mildly dilated. 5. Mild mitral valve prolapse. 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 Mild thickening of the mitral valve leaflet. There is mild to moderate mitral annular calcification present.Mild mitral regurgitation. There is mild prolapse of both mitral leaflets. 7. Tricuspid valve regurgitation is mild-moderate. 8. The aortic valve is grossly normal. Mild thickening  of the aortic valve. Mild calcification of the aortic valve. No stenosis of the aortic valve. 9. The aorta is abnormal unless otherwise noted. 10. There is mild to moderate dilatation of the aortic root and of the ascending aorta.Ao Root 3.20 cm. Ao Ascending 3.90 cm. 11. The atrial septum is grossly normal.  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 )       Anesthesia Quick Evaluation

## 2020-07-04 NOTE — Progress Notes (Signed)
Spoke with Daughter [name] cell [phone].  [name] states patient does not have shortness of breath, fever, cough or chest pain.  PCP - Mayer Masker, PA-C Cardiologist - Dr Donato Schultz Urologist - Dr Sherron Monday  Chest x-ray - 12/31/19 (2V) EKG - 01/04/20 Stress Test - 2012 ECHO - 08/22/19 Cardiac Cath - 2006  Blood Thinner Instructions:  Follow your surgeon's instructions on when to stop Xarelto prior to surgery.  Hold 2 days proor to procedure per MD. Last  Dose 07/04/20.  Anesthesia review: Yes  STOP now taking any Aspirin (unless otherwise instructed by your surgeon), Aleve, Naproxen, Ibuprofen, Motrin, Advil, Goody's, BC's, all herbal medications, fish oil, and all vitamins.   Coronavirus Screening Covid test on 07/03/20 was negative.  Daughter [name] verbalized understanding of instructions that were given via phone.

## 2020-07-07 ENCOUNTER — Encounter (HOSPITAL_COMMUNITY)
Admission: RE | Disposition: A | Discharge status: Home / Self Care | Payer: Self-pay | Source: Home / Self Care | Attending: Gastroenterology

## 2020-07-07 ENCOUNTER — Telehealth: Payer: Self-pay

## 2020-07-07 ENCOUNTER — Ambulatory Visit (HOSPITAL_COMMUNITY)
Admission: RE | Admit: 2020-07-07 | Discharge: 2020-07-07 | Disposition: A | Discharge status: Home / Self Care | Payer: Medicare PPO | Attending: Gastroenterology | Admitting: Gastroenterology

## 2020-07-07 DIAGNOSIS — Z538 Procedure and treatment not carried out for other reasons: Secondary | ICD-10-CM | POA: Insufficient documentation

## 2020-07-07 SURGERY — CANCELLED PROCEDURE

## 2020-07-07 NOTE — Progress Notes (Addendum)
Pt canceled for procedure today due to not stopping xarelto.  Dr. Meridee Score aware.  Pt to be rescheduled for later date.  Roselie Awkward, RN

## 2020-07-07 NOTE — Telephone Encounter (Signed)
-----   Message from Lemar Lofty., MD sent at 07/07/2020  2:15 PM EDT ----- Regarding: RE: rescheduling procedure Christina Ortiz,This patient ended up taking her anticoagulation yesterday and this morning.  We postponed her ERCP for today.Can you please work on getting her rescheduled most likely at the end of August based on our availability?Thanks.GM ----- Message ----- From: Bryson Dames, RN Sent: 07/07/2020   7:38 AM EDT To: Lemar Lofty., MD Subject: rescheduling procedure                         Please reschedule d/t xarelton

## 2020-07-08 ENCOUNTER — Other Ambulatory Visit: Payer: Self-pay

## 2020-07-08 DIAGNOSIS — K831 Obstruction of bile duct: Secondary | ICD-10-CM

## 2020-07-08 DIAGNOSIS — K8050 Calculus of bile duct without cholangitis or cholecystitis without obstruction: Secondary | ICD-10-CM

## 2020-07-08 NOTE — Telephone Encounter (Signed)
ERCP rescheduled to 09/10/20 at 1045 am at [facility] with Dr Meridee Score. COVID test on 9/11 at 12:10 pm.  The pt daughter has been advised and will confirm that the pt stops her xarelto as ordered prior to procedure.  She is aware that the instructions have been sent to My Chart

## 2020-07-11 ENCOUNTER — Other Ambulatory Visit: Payer: Self-pay

## 2020-07-11 ENCOUNTER — Encounter: Payer: Self-pay | Admitting: Physician Assistant

## 2020-07-11 ENCOUNTER — Ambulatory Visit (INDEPENDENT_AMBULATORY_CARE_PROVIDER_SITE_OTHER): Payer: Medicare PPO | Admitting: Physician Assistant

## 2020-07-11 VITALS — BP 144/91 | HR 83 | Temp 98.1°F | Ht 60.0 in | Wt 130.3 lb

## 2020-07-11 DIAGNOSIS — F339 Major depressive disorder, recurrent, unspecified: Secondary | ICD-10-CM

## 2020-07-11 DIAGNOSIS — I10 Essential (primary) hypertension: Secondary | ICD-10-CM | POA: Diagnosis not present

## 2020-07-11 MED ORDER — BUPROPION HCL ER (XL) 150 MG PO TB24: 150.0000 mg | ORAL_TABLET | Freq: Every day | ORAL | 1 refills | Status: DC

## 2020-07-11 MED ORDER — QUINAPRIL HCL 40 MG PO TABS: 40.0000 mg | ORAL_TABLET | Freq: Every day | ORAL | 1 refills | Status: DC

## 2020-07-11 NOTE — Progress Notes (Signed)
Established Patient Office Visit  Subjective:  Patient ID: Christina Ortiz, female    DOB: March 15, 1929  Age: 86 y.o. MRN: [id number]  CC:  Chief Complaint  Patient presents with  . Depression    HPI Earsie Messamore presents for follow-up on depression. Patient is accompanied by daughter. Pt has been feeling more down lately and has been on Paxil 40 mg for a long time. She has been out of Paxil for about a week now. Pt's daughter is inquiring about changing medication, and would like to try Wellbutrin if appropriate. Pt denies history of seizures. Pt denies changes in appetite and reports eating 3 meals per day. Pt's daughter states she has noticed patient eats less than before. Pt has history of alcohol abuse and daughter has been helping with managing alcohol intake by removing alcohol from patient's home and has taken pt's car keys for safety. Pt also requesting refill of BP medication.  Past Medical History:  Diagnosis Date  . Acute bronchitis 10/2017  . Arthritis   . CKD (chronic kidney disease)    Xarelto dose is 15 mg QD  . Coronary artery disease    a. s/p PCI/stenting to RCA and LAD ([facility] in [place]) in 2006; b. Nuc 1/15: No ischemia, EF 70%  . Depression   . Gallstones   . Gastritis   . GERD (gastroesophageal reflux disease)   . Hiatal hernia   . Hip fracture (HCC)   . History of cholecystectomy   . History of echocardiogram    a. Echo 1/15: Moderate LVH, EF 55-60%, impaired relaxation, mild AS  . Hyperlipidemia   . Hypertension   . Hypokalemia   . Hypothyroidism   . Mobitz type 1 second degree atrioventricular block    a. Event Monitor 3/15: NSR, first-degree AV block, second-degree AB block - Mobitz 1, PACs, NSVT (5 beats)  . PAF (paroxysmal atrial fibrillation) (HCC)   . Thyroid disease     Past Surgical History:  Procedure Laterality Date  . ANGIOPLASTY  2006   with sent placement  . BACK SURGERY     Dr Shon Baton  . BILIARY DILATION  10/18/2019    Procedure: BILIARY DILATION;  Surgeon: Rachael Fee, MD;  Location: [facility];  Service: Endoscopy;;  . BILIARY DILATION  12/17/2019   Procedure: BILIARY DILATION;  Surgeon: [name], MD;  Location: [facility] ENDOSCOPY;  Service: Endoscopy;;  . BILIARY DILATION  02/07/2020   Procedure: BILIARY DILATION;  Surgeon: Lemar Lofty., MD;  Location: [facility] ENDOSCOPY;  Service: Gastroenterology;;  . BILIARY STENT PLACEMENT N/A 10/18/2019   Procedure: BILIARY STENT PLACEMENT;  Surgeon: Rachael Fee, MD;  Location: [facility] ENDOSCOPY;  Service: Endoscopy;  Laterality: N/A;  . BILIARY STENT PLACEMENT N/A 12/17/2019   Procedure: BILIARY STENT PLACEMENT;  Surgeon: [name], MD;  Location: [facility] ENDOSCOPY;  Service: Endoscopy;  Laterality: N/A;  . BILIARY STENT PLACEMENT  02/07/2020   Procedure: BILIARY STENT PLACEMENT;  Surgeon: Meridee Score Netty Starring., MD;  Location: [facility] ENDOSCOPY;  Service: Gastroenterology;;  . BLEPHAROPLASTY    . BREAST LUMPECTOMY Left   . CARDIAC CATHETERIZATION  2006  . CATARACT EXTRACTION Bilateral   . CHOLECYSTECTOMY    . DILATION AND CURETTAGE OF UTERUS    . ENDOSCOPIC RETROGRADE CHOLANGIOPANCREATOGRAPHY (ERCP) WITH PROPOFOL N/A 12/17/2019   Procedure: ENDOSCOPIC RETROGRADE CHOLANGIOPANCREATOGRAPHY (ERCP) WITH PROPOFOL;  Surgeon: [name], MD;  Location: [facility] ENDOSCOPY;  Service: Endoscopy;  Laterality: N/A;  . ENDOSCOPIC RETROGRADE CHOLANGIOPANCREATOGRAPHY (ERCP) WITH  PROPOFOL N/A 02/07/2020   Procedure: ENDOSCOPIC RETROGRADE CHOLANGIOPANCREATOGRAPHY (ERCP) WITH PROPOFOL;  Surgeon: Meridee Score Netty Starring., MD;  Location: [facility] ENDOSCOPY;  Service: Gastroenterology;  Laterality: N/A;  . ERCP N/A 10/18/2019   Procedure: ENDOSCOPIC RETROGRADE CHOLANGIOPANCREATOGRAPHY (ERCP);  Surgeon: Rachael Fee, MD;  Location: [facility] ENDOSCOPY;  Service: Endoscopy;  Laterality: N/A;  . ESOPHAGOGASTRODUODENOSCOPY (EGD) WITH PROPOFOL N/A 12/17/2019   Procedure:  ESOPHAGOGASTRODUODENOSCOPY (EGD) WITH PROPOFOL;  Surgeon: [name], MD;  Location: [facility] ENDOSCOPY;  Service: Endoscopy;  Laterality: N/A;  . KYPHOPLASTY N/A 01/03/2020   Procedure: KYPHOPLASTY LUMBAR TWO;  Surgeon: Venita Lick, MD;  Location: [facility] OR;  Service: Orthopedics;  Laterality: N/A;  . REMOVAL OF STONES  10/18/2019   Procedure: REMOVAL OF STONES;  Surgeon: Rachael Fee, MD;  Location: [facility] ENDOSCOPY;  Service: Endoscopy;;  . REMOVAL OF STONES  12/17/2019   Procedure: REMOVAL OF STONES;  Surgeon: [name], MD;  Location: [facility] ENDOSCOPY;  Service: Endoscopy;;  . REMOVAL OF STONES  02/07/2020   Procedure: REMOVAL OF STONES;  Surgeon: Lemar Lofty., MD;  Location: [facility] ENDOSCOPY;  Service: Gastroenterology;;  . Gaspar Bidding DILATION N/A 12/17/2019   Procedure: Gaspar Bidding DILATION;  Surgeon: [name], MD;  Location: [facility] ENDOSCOPY;  Service: Endoscopy;  Laterality: N/A;  . SPHINCTEROTOMY  10/18/2019   Procedure: SPHINCTEROTOMY;  Surgeon: Rachael Fee, MD;  Location: [facility] ENDOSCOPY;  Service: Endoscopy;;  . Dennison Mascot  12/17/2019   Procedure: Dennison Mascot;  Surgeon: [name], MD;  Location: [facility] ENDOSCOPY;  Service: Endoscopy;;  . Burman Freestone CHOLANGIOSCOPY N/A 02/07/2020   Procedure: WIOXBDZH CHOLANGIOSCOPY;  Surgeon: Lemar Lofty., MD;  Location: [facility] ENDOSCOPY;  Service: Gastroenterology;  Laterality: N/A;  Burman Freestone LITHOTRIPSY N/A 02/07/2020   Procedure: GDJMEQAS LITHOTRIPSY;  Surgeon: Lemar Lofty., MD;  Location: [facility] ENDOSCOPY;  Service: Gastroenterology;  Laterality: N/A;  . STENT REMOVAL  12/17/2019   Procedure: STENT REMOVAL;  Surgeon: [name], MD;  Location: [facility] ENDOSCOPY;  Service: Endoscopy;;  . STENT REMOVAL  02/07/2020   Procedure: STENT REMOVAL;  Surgeon: Lemar Lofty., MD;  Location: [facility] ENDOSCOPY;  Service: Gastroenterology;;    Family History  Problem Relation Age of Onset  . Heart disease Mother   . Heart  attack Mother   . Lung cancer Father   . Lymphoma Brother   . Heart failure Brother   . Esophageal cancer Paternal Uncle   . Colon cancer Neg Hx   . Colon polyps Neg Hx   . Rectal cancer Neg Hx   . Stomach cancer Neg Hx     Social History   Socioeconomic History  . Marital status: Widowed    Spouse name: Not on file  . Number of children: 1  . Years of education: Not on file  . Highest education level: Not on file  Occupational History  . Occupation: retired  Tobacco Use  . Smoking status: Former Smoker    Packs/day: 0.50    Years: 67.00    Pack years: 33.50    Types: Cigarettes    Quit date: 10/24/2014    Years since quitting: 5.7  . Smokeless tobacco: Never Used  . Tobacco comment: quit 2015  Vaping Use  . Vaping Use: Never used  Substance and Sexual Activity  . Alcohol use: Yes    Alcohol/week: 10.0 - 14.0 standard drinks    Types: 10 - 14 Shots of liquor per week    Comment: vodka  . Drug use: No  . Sexual  activity: Never    Birth control/protection: Post-menopausal  Other Topics Concern  . Not on file  Social History Narrative  . Not on file   Social Determinants of Health   Financial Resource Strain:   . Difficulty of Paying Living Expenses:   Food Insecurity:   . Worried About Programme researcher, broadcasting/film/video in the Last Year:   . Barista in the Last Year:   Transportation Needs:   . Freight forwarder (Medical):   Marland Kitchen Lack of Transportation (Non-Medical):   Physical Activity:   . Days of Exercise per Week:   . Minutes of Exercise per Session:   Stress:   . Feeling of Stress :   Social Connections:   . Frequency of Communication with Friends and Family:   . Frequency of Social Gatherings with Friends and Family:   . Attends Religious Services:   . Active Member of Clubs or Organizations:   . Attends Banker Meetings:   Marland Kitchen Marital Status:   Intimate Partner Violence:   . Fear of Current or Ex-Partner:   . Emotionally Abused:   Marland Kitchen  Physically Abused:   . Sexually Abused:     Outpatient Medications Prior to Visit  Medication Sig Dispense Refill  . acetaminophen (TYLENOL) 325 MG tablet Take 650 mg by mouth every 6 (six) hours as needed for moderate pain (for pain.).     Marland Kitchen amLODipine (NORVASC) 5 MG tablet TAKE 1 TABLET BY MOUTH DAILY. (Patient taking differently: Take 5 mg by mouth daily. ) 90 tablet 1  . antiseptic oral rinse (BIOTENE) LIQD 15 mLs by Mouth Rinse route daily as needed for dry mouth.     Marland Kitchen CALCIUM-MAGNESIUM-ZINC PO Take 1 tablet by mouth every morning.    . fesoterodine (TOVIAZ) 4 MG TB24 tablet Take 4 mg by mouth daily.    . folic acid (FOLVITE) 1 MG tablet TAKE 1 TABLET BY MOUTH DAILY (Patient taking differently: Take 1 mg by mouth every evening. ) 90 tablet 3  . levothyroxine (SYNTHROID) 75 MCG tablet TAKE 1 TABLET BY MOUTH DAILY BEFORE BREAKFAST. (Patient taking differently: Take 75 mcg by mouth daily before breakfast. ) 90 tablet 0  . metoprolol tartrate (LOPRESSOR) 25 MG tablet TAKE 1/2 TABLET BY MOUTH TWICE A DAY. (Patient taking differently: Take 12.5 mg by mouth 2 (two) times daily. ) 90 tablet 0  . omeprazole (PRILOSEC) 20 MG capsule TAKE 1 CAPSULE BY MOUTH DAILY AS NEEDED FOR HEARTBURN. (Patient taking differently: Take 20 mg by mouth at bedtime. ) 90 capsule 3  . polyethylene glycol (MIRALAX / GLYCOLAX) packet Take 17 g by mouth daily.     . potassium chloride SA (K-DUR) 20 MEQ tablet Take 1 tablet (20 mEq total) by mouth daily. 90 tablet 3  . Propylene Glycol (SYSTANE BALANCE) 0.6 % SOLN Place 1 drop into both eyes daily.    . simvastatin (ZOCOR) 5 MG tablet TAKE 1 TABLET BY MOUTH EVERY EVENING. (Patient taking differently: Take 5 mg by mouth every evening. ) 90 tablet 0  . spironolactone (ALDACTONE) 25 MG tablet Take 0.5 tablets (12.5 mg total) by mouth daily. 45 tablet 3  . VITAMIN D PO Take 1 tablet by mouth at bedtime.    [name] 15 MG TABS tablet TAKE 1 TABLET BY MOUTH DAILY WITH SUPPER.  (Patient taking differently: Take 15 mg by mouth daily with supper. ) 90 tablet 1  . PARoxetine (PAXIL) 40 MG tablet TAKE 1 TABLET BY  MOUTH DAILY. NEED OFFICE VISIT FOR FURTHER REFILLS 60 tablet 0  . quinapril (ACCUPRIL) 40 MG tablet Take 1 tablet (40 mg total) by mouth daily. **PATIENT NEEDS APT FOR FURTHER REFILLS** 60 tablet 0   No facility-administered medications prior to visit.    Allergies  Allergen Reactions  . Tape Other (See Comments)    Patient's skin is thin and will TEAR AND BRUISE EASILY    ROS Review of Systems   A fourteen system review of systems was performed and found to be positive as per HPI.  Objective:    Physical Exam General:  Well Developed, well nourished, appropriate for stated age.  Neuro:  Alert and oriented,  extra-ocular muscles intact, no focal deficits  HEENT:  Normocephalic, atraumatic, neck supple Skin:  no gross rash, warm, pink. Cardiac:  RRR, S1 S2 Respiratory:  ECTA B/L, Not using accessory muscles, speaking in full sentences- unlabored. Vascular:  Ext warm, no cyanosis apprec. Psych:  No HI/SI, judgement and insight good, Euthymic mood. Full Affect.  BP (!) 144/91   Pulse 83   Temp 98.1 F (36.7 C) (Oral)   Ht 5' (1.524 m)   Wt 130 lb 4.8 oz (59.1 kg)   LMP  (LMP Unknown)   SpO2 94% Comment: on RA  BMI 25.45 kg/m  Wt Readings from Last 3 Encounters:  07/11/20 130 lb 4.8 oz (59.1 kg)  07/04/20 130 lb (59 kg)  06/23/20 130 lb (59 kg)     There are no preventive care reminders to display for this patient.  There are no preventive care reminders to display for this patient.  Lab Results  Component Value Date   TSH 1.270 07/05/2019   Lab Results  Component Value Date   WBC 6.9 07/23/2020   HGB 14.6 07/23/2020   HCT 44.4 07/23/2020   MCV 101.4 (H) 07/23/2020   PLT 224 07/23/2020   Lab Results  Component Value Date   NA 136 07/23/2020   K 4.2 07/23/2020   CO2 22 07/23/2020   GLUCOSE 89 07/23/2020   BUN 24 (H)  07/23/2020   CREATININE 1.32 (H) 07/23/2020   BILITOT 0.8 07/23/2020   ALKPHOS 105 07/23/2020   AST 20 07/23/2020   ALT 8 07/23/2020   PROT 8.0 07/23/2020   ALBUMIN 4.1 07/23/2020   CALCIUM 9.4 07/23/2020   ANIONGAP 13 07/23/2020   GFR 47.84 (L) 02/15/2020   Lab Results  Component Value Date   CHOL 146 07/05/2019   Lab Results  Component Value Date   HDL 57 07/05/2019   Lab Results  Component Value Date   LDLCALC 75 07/05/2019   Lab Results  Component Value Date   TRIG 71 07/05/2019   Lab Results  Component Value Date   CHOLHDL 2.6 07/05/2019   Lab Results  Component Value Date   HGBA1C 5.5 09/27/2019      Assessment & Plan:   Problem List Items Addressed This Visit      Cardiovascular and Mediastinum   Hypertension - Primary   Relevant Medications   quinapril (ACCUPRIL) 40 MG tablet    Other Visit Diagnoses    Depression, recurrent (HCC)       Relevant Medications   buPROPion (WELLBUTRIN XL) 150 MG 24 hr tablet     Depression: -PHQ-9 score of 8 -Pt and daughter are wanting to change antidepressants due to possible medication tolerance and increased depressive symptoms recently which is reasonable. Pt has no history of seizures so Wellbutrin would be ok  to trial. Advised to monitor for potential side effects and will closely monitor weight. Pt has lost about 13 pounds which could be due to depression vs eating disorder. Discussed alternatives with safe side effect profile such as Zoloft if Wellbutrin not tolerated. -Follow up in 8 weeks to reassess symptoms and medication therapy.  Hypertension: -Continue current medication regimen. Provided refill. -Per chart review, BP varies during office visits. -Recommend ambulatory BP and pulse monitoring. Notify the clinic if BP consistently above 140/90.   Meds ordered this encounter  Medications  . buPROPion (WELLBUTRIN XL) 150 MG 24 hr tablet    Sig: Take 1 tablet (150 mg total) by mouth daily.    Dispense:   90 tablet    Refill:  1    Order Specific Question:   Supervising Provider    Answer:   Nani Gasser D [[id number]]  . quinapril (ACCUPRIL) 40 MG tablet    Sig: Take 1 tablet (40 mg total) by mouth daily.    Dispense:  90 tablet    Refill:  1    Order Specific Question:   Supervising Provider    Answer:   Nani Gasser D [[id number]]    Follow-up: Return for Mood- started new med, in 8 weeks (ok for telehealth).    Mayer Masker, PA-C

## 2020-07-23 ENCOUNTER — Emergency Department (HOSPITAL_BASED_OUTPATIENT_CLINIC_OR_DEPARTMENT_OTHER): Payer: Medicare PPO

## 2020-07-23 ENCOUNTER — Other Ambulatory Visit: Payer: Self-pay

## 2020-07-23 ENCOUNTER — Encounter (HOSPITAL_BASED_OUTPATIENT_CLINIC_OR_DEPARTMENT_OTHER): Payer: Self-pay

## 2020-07-23 ENCOUNTER — Emergency Department (HOSPITAL_BASED_OUTPATIENT_CLINIC_OR_DEPARTMENT_OTHER)
Admission: EM | Admit: 2020-07-23 | Discharge: 2020-07-23 | Disposition: A | Discharge status: Home / Self Care | Payer: Medicare PPO | Attending: Emergency Medicine | Admitting: Emergency Medicine

## 2020-07-23 DIAGNOSIS — J90 Pleural effusion, not elsewhere classified: Secondary | ICD-10-CM | POA: Diagnosis not present

## 2020-07-23 DIAGNOSIS — I1310 Hypertensive heart and chronic kidney disease without heart failure, with stage 1 through stage 4 chronic kidney disease, or unspecified chronic kidney disease: Secondary | ICD-10-CM | POA: Diagnosis not present

## 2020-07-23 DIAGNOSIS — Z87891 Personal history of nicotine dependence: Secondary | ICD-10-CM | POA: Diagnosis not present

## 2020-07-23 DIAGNOSIS — R4182 Altered mental status, unspecified: Secondary | ICD-10-CM | POA: Diagnosis not present

## 2020-07-23 DIAGNOSIS — R197 Diarrhea, unspecified: Secondary | ICD-10-CM | POA: Insufficient documentation

## 2020-07-23 DIAGNOSIS — E039 Hypothyroidism, unspecified: Secondary | ICD-10-CM | POA: Diagnosis not present

## 2020-07-23 DIAGNOSIS — Z79899 Other long term (current) drug therapy: Secondary | ICD-10-CM | POA: Insufficient documentation

## 2020-07-23 DIAGNOSIS — J9811 Atelectasis: Secondary | ICD-10-CM | POA: Diagnosis not present

## 2020-07-23 DIAGNOSIS — N182 Chronic kidney disease, stage 2 (mild): Secondary | ICD-10-CM | POA: Diagnosis not present

## 2020-07-23 DIAGNOSIS — I517 Cardiomegaly: Secondary | ICD-10-CM | POA: Diagnosis not present

## 2020-07-23 DIAGNOSIS — Z9689 Presence of other specified functional implants: Secondary | ICD-10-CM | POA: Diagnosis not present

## 2020-07-23 DIAGNOSIS — R21 Rash and other nonspecific skin eruption: Secondary | ICD-10-CM | POA: Diagnosis not present

## 2020-07-23 LAB — CBC WITH DIFFERENTIAL/PLATELET
Abs Immature Granulocytes: 0.04 10*3/uL (ref 0.00–0.07)
Basophils Absolute: 0 10*3/uL (ref 0.0–0.1)
Basophils Relative: 0 %
Eosinophils Absolute: 0.2 10*3/uL (ref 0.0–0.5)
Eosinophils Relative: 3 %
HCT: 44.4 % (ref 36.0–46.0)
Hemoglobin: 14.6 g/dL (ref 12.0–15.0)
Immature Granulocytes: 1 %
Lymphocytes Relative: 24 %
Lymphs Abs: 1.6 10*3/uL (ref 0.7–4.0)
MCH: 33.3 pg (ref 26.0–34.0)
MCHC: 32.9 g/dL (ref 30.0–36.0)
MCV: 101.4 fL — ABNORMAL HIGH (ref 80.0–100.0)
Monocytes Absolute: 0.7 10*3/uL (ref 0.1–1.0)
Monocytes Relative: 11 %
Neutro Abs: 4.3 10*3/uL (ref 1.7–7.7)
Neutrophils Relative %: 61 %
Platelets: 224 10*3/uL (ref 150–400)
RBC: 4.38 MIL/uL (ref 3.87–5.11)
RDW: 13.3 % (ref 11.5–15.5)
WBC: 6.9 10*3/uL (ref 4.0–10.5)
nRBC: 0 % (ref 0.0–0.2)

## 2020-07-23 LAB — URINALYSIS, ROUTINE W REFLEX MICROSCOPIC
Bilirubin Urine: NEGATIVE
Glucose, UA: NEGATIVE mg/dL
Hgb urine dipstick: NEGATIVE
Ketones, ur: NEGATIVE mg/dL
Nitrite: NEGATIVE
Protein, ur: NEGATIVE mg/dL
Specific Gravity, Urine: >1.03 — ABNORMAL HIGH (ref 1.005–1.030)
pH: 5.5 (ref 5.0–8.0)

## 2020-07-23 LAB — COMPREHENSIVE METABOLIC PANEL
ALT: 8 U/L (ref 0–44)
AST: 20 U/L (ref 15–41)
Albumin: 4.1 g/dL (ref 3.5–5.0)
Alkaline Phosphatase: 105 U/L (ref 38–126)
Anion gap: 13 (ref 5–15)
BUN: 24 mg/dL — ABNORMAL HIGH (ref 8–23)
CO2: 22 mmol/L (ref 22–32)
Calcium: 9.4 mg/dL (ref 8.9–10.3)
Chloride: 101 mmol/L (ref 98–111)
Creatinine, Ser: 1.32 mg/dL — ABNORMAL HIGH (ref 0.44–1.00)
GFR calc Af Amer: 42 mL/min — ABNORMAL LOW (ref >60)
GFR calc non Af Amer: 36 mL/min — ABNORMAL LOW (ref >60)
Glucose, Bld: 89 mg/dL (ref 70–99)
Potassium: 4.2 mmol/L (ref 3.5–5.1)
Sodium: 136 mmol/L (ref 135–145)
Total Bilirubin: 0.8 mg/dL (ref 0.3–1.2)
Total Protein: 8 g/dL (ref 6.5–8.1)

## 2020-07-23 LAB — CBG MONITORING, ED: Glucose-Capillary: 79 mg/dL (ref 70–99)

## 2020-07-23 LAB — URINALYSIS, MICROSCOPIC (REFLEX): RBC / HPF: NONE SEEN RBC/hpf (ref 0–5)

## 2020-07-23 LAB — ETHANOL: Alcohol, Ethyl (B): <10 mg/dL (ref <10)

## 2020-07-23 NOTE — ED Provider Notes (Signed)
[facility] EMERGENCY DEPARTMENT Provider Note   CSN: [id number] Arrival date & time: 07/23/20  1552     History Chief Complaint  Patient presents with  . Altered Mental Status    Charelle Idris is a 86 y.o. female.  HPI   Patient is an 86 year old female with a history of CKD, CAD, hyperlipidemia, hypertension, paroxysmal atrial fibrillation, who presents to the emergency department today with her daughter for evaluation of altered mental status  Patient's daughter is at bedside and provides some of the history.  She states that the patient called her earlier this morning and left her voicemail stating that her father and aunt were at the patient's house and that her aunt had a baby with her.  Her father has been deceased for the last 3 years and her aunt is living in a nursing home in [place] so this could not be true.  The patient does acknowledge that her husband has passed away but still insists that she saw him and her aunt.  The patient reports she has had some diarrhea for the last several days but she denies any abdominal pain, nausea, vomiting, fevers, cough or urinary symptoms.  The daughter states that patient had a UTI 2 weeks ago and did complete all of her antibiotics.  She does state that the patient drinks alcohol the patient denies any alcohol use for the last 2 weeks.  Patient denies any recent falls.  Past Medical History:  Diagnosis Date  . Acute bronchitis 10/2017  . Arthritis   . CKD (chronic kidney disease)    Xarelto dose is 15 mg QD  . Coronary artery disease    a. s/p PCI/stenting to RCA and LAD ([facility] in [place]) in 2006; b. Nuc 1/15: No ischemia, EF 70%  . Depression   . Gallstones   . Gastritis   . GERD (gastroesophageal reflux disease)   . Hiatal hernia   . Hip fracture (HCC)   . History of cholecystectomy   . History of echocardiogram    a. Echo 1/15: Moderate LVH, EF 55-60%, impaired relaxation, mild AS  . Hyperlipidemia   .  Hypertension   . Hypokalemia   . Hypothyroidism   . Mobitz type 1 second degree atrioventricular block    a. Event Monitor 3/15: NSR, first-degree AV block, second-degree AB block - Mobitz 1, PACs, NSVT (5 beats)  . PAF (paroxysmal atrial fibrillation) (HCC)   . Thyroid disease     Patient Active Problem List   Diagnosis Date Noted  . Surgical counseling visit 12/10/2019  . History of esophageal stricture 11/02/2019  . Muscular deconditioning 11/01/2019  . Abnormal liver function tests   . Common bile duct calculus 10/16/2019  . Abnormal urinalysis 09/28/2019  . Adult failure to thrive syndrome 09/28/2019  . Dysphagia 09/06/2018  . Pain   . Closed fracture of femur, greater trochanter (HCC) 06/04/2018  . Hypoxia 06/03/2018  . Pain in right arm 04/10/2018  . Fall 03/31/2018  . Fracture of shaft of humerus 03/31/2018  . Disorder of skeletal muscle 03/31/2018  . Hypothermia 03/31/2018  . Healthcare maintenance 01/17/2018  . Nocturia 01/17/2018  . Macrocytosis 11/22/2017  . Acute bronchitis due to respiratory syncytial virus 11/09/2017  . Acute respiratory failure (HCC) 11/09/2017  . Hypertension 11/09/2017  . Paroxysmal atrial fibrillation (HCC) 11/09/2017  . Coronary artery disease 11/09/2017  . CKD (chronic kidney disease) stage 2, GFR 60-89 ml/min 11/09/2017  . Hypothyroidism 11/09/2017  . Dyspnea 11/08/2017  .  Upper respiratory infection 11/08/2017  . Serum creatinine raised 06/21/2017  . Ankle edema 06/21/2017  . Diarrhea 05/25/2017  . Hypokalemia 05/25/2017  . Alcohol abuse 05/25/2017  . Recurrent depression (HCC) 03/30/2017  . Low back pain 03/30/2017  . Influenza 01/22/2017  . Coronary atherosclerosis 04/04/2015  . Hypertensive disorder 04/04/2015  . Hyperlipidemia 04/04/2015  . Ex-smoker 04/04/2015  . Chronic anticoagulation 04/04/2015    Past Surgical History:  Procedure Laterality Date  . ANGIOPLASTY  2006   with sent placement  . BACK SURGERY     Dr  Shon Baton  . BILIARY DILATION  10/18/2019   Procedure: BILIARY DILATION;  Surgeon: Rachael Fee, MD;  Location: [facility];  Service: Endoscopy;;  . BILIARY DILATION  12/17/2019   Procedure: BILIARY DILATION;  Surgeon: [name], MD;  Location: [facility] ENDOSCOPY;  Service: Endoscopy;;  . BILIARY DILATION  02/07/2020   Procedure: BILIARY DILATION;  Surgeon: Lemar Lofty., MD;  Location: [facility] END[facility]COPY;  Service: Gastroenterology;;  . BILIARY STENT PLACEMENT N/A 10/18/2019   Procedure: BILIARY STENT PLACEMENT;  Surgeon: Rachael Fee, MD;  Location: [facility] ENDOSCOPY;  Service: Endoscopy;  Laterality: N/A;  . BILIARY STENT PLACEMENT N/A 12/17/2019   Procedure: BILIARY STENT PLACEMENT;  Surgeon: [name], MD;  Location: [facility] ENDOSCOPY;  Service: Endoscopy;  Laterality: N/A;  . BILIARY STENT PLACEMENT  02/07/2020   Procedure: BILIARY STENT PLACEMENT;  Surgeon: Meridee Score Netty Starring., MD;  Location: [facility] ENDOSCOPY;  Service: Gastroenterology;;  . BLEPHAROPLASTY    . BREAST LUMPECTOMY Left   . CARDIAC CATHETERIZATION  2006  . CATARACT EXTRACTION Bilateral   . CHOLECYSTECTOMY    . DILATION AND CURETTAGE OF UTERUS    . ENDOSCOPIC RETROGRADE CHOLANGIOPANCREATOGRAPHY (ERCP) WITH PROPOFOL N/A 12/17/2019   Procedure: ENDOSCOPIC RETROGRADE CHOLANGIOPANCREATOGRAPHY (ERCP) WITH PROPOFOL;  Surgeon: [name], MD;  Location: [facility] ENDOSCOPY;  Service: Endoscopy;  Laterality: N/A;  . ENDOSCOPIC RETROGRADE CHOLANGIOPANCREATOGRAPHY (ERCP) WITH PROPOFOL N/A 02/07/2020   Procedure: ENDOSCOPIC RETROGRADE CHOLANGIOPANCREATOGRAPHY (ERCP) WITH PROPOFOL;  Surgeon: Meridee Score Netty Starring., MD;  Location: [facility] ENDOSCOPY;  Service: Gastroenterology;  Laterality: N/A;  . ERCP N/A 10/18/2019   Procedure: ENDOSCOPIC RETROGRADE CHOLANGIOPANCREATOGRAPHY (ERCP);  Surgeon: Rachael Fee, MD;  Location: [facility] ENDOSCOPY;  Service: Endoscopy;  Laterality: N/A;  . ESOPHAGOGASTRODUODENOSCOPY (EGD) WITH PROPOFOL N/A  12/17/2019   Procedure: ESOPHAGOGASTRODUODENOSCOPY (EGD) WITH PROPOFOL;  Surgeon: [name], MD;  Location: [facility] ENDOSCOPY;  Service: Endoscopy;  Laterality: N/A;  . KYPHOPLASTY N/A 01/03/2020   Procedure: KYPHOPLASTY LUMBAR TWO;  Surgeon: Venita Lick, MD;  Location: [facility] OR;  Service: Orthopedics;  Laterality: N/A;  . REMOVAL OF STONES  10/18/2019   Procedure: REMOVAL OF STONES;  Surgeon: Rachael Fee, MD;  Location: [facility] ENDOSCOPY;  Service: Endoscopy;;  . REMOVAL OF STONES  12/17/2019   Procedure: REMOVAL OF STONES;  Surgeon: [name], MD;  Location: [facility] ENDOSCOPY;  Service: Endoscopy;;  . REMOVAL OF STONES  02/07/2020   Procedure: REMOVAL OF STONES;  Surgeon: Lemar Lofty., MD;  Location: [facility] ENDOSCOPY;  Service: Gastroenterology;;  . Gaspar Bidding DILATION N/A 12/17/2019   Procedure: Gaspar Bidding DILATION;  Surgeon: [name], MD;  Location: [facility] ENDOSCOPY;  Service: Endoscopy;  Laterality: N/A;  . SPHINCTEROTOMY  10/18/2019   Procedure: SPHINCTEROTOMY;  Surgeon: Rachael Fee, MD;  Location: [facility] ENDOSCOPY;  Service: Endoscopy;;  . Dennison Mascot  12/17/2019   Procedure: Dennison Mascot;  Surgeon: [name], MD;  Location: [facility] ENDOSCOPY;  Service: Endoscopy;;  . SPYGLASS CHOLANGIOSCOPY  N/A 02/07/2020   Procedure: ZOXWRUEA CHOLANGIOSCOPY;  Surgeon: Lemar Lofty., MD;  Location: [facility] ENDOSCOPY;  Service: Gastroenterology;  Laterality: N/A;  Burman Freestone LITHOTRIPSY N/A 02/07/2020   Procedure: [id number] LITHOTRIPSY;  Surgeon: Lemar Lofty., MD;  Location: [facility] ENDOSCOPY;  Service: Gastroenterology;  Laterality: N/A;  . STENT REMOVAL  12/17/2019   Procedure: STENT REMOVAL;  Surgeon: [name], MD;  Location: [facility] ENDOSCOPY;  Service: Endoscopy;;  . STENT REMOVAL  02/07/2020   Procedure: STENT REMOVAL;  Surgeon: Lemar Lofty., MD;  Location: [facility] ENDOSCOPY;  Service: Gastroenterology;;     OB History   No obstetric history on file.     Family  History  Problem Relation Age of Onset  . Heart disease Mother   . Heart attack Mother   . Lung cancer Father   . Lymphoma Brother   . Heart failure Brother   . Esophageal cancer Paternal Uncle   . Colon cancer Neg Hx   . Colon polyps Neg Hx   . Rectal cancer Neg Hx   . Stomach cancer Neg Hx     Social History   Tobacco Use  . Smoking status: Former Smoker    Packs/day: 0.50    Years: 67.00    Pack years: 33.50    Types: Cigarettes    Quit date: 10/24/2014    Years since quitting: 5.7  . Smokeless tobacco: Never Used  . Tobacco comment: quit 2015  Vaping Use  . Vaping Use: Never used  Substance Use Topics  . Alcohol use: Yes    Alcohol/week: 10.0 - 14.0 standard drinks    Types: 10 - 14 Shots of liquor per week    Comment: vodka  . Drug use: No    Home Medications Prior to Admission medications   Medication Sig Start Date End Date Taking? Authorizing Provider  acetaminophen (TYLENOL) 325 MG tablet Take 650 mg by mouth every 6 (six) hours as needed for moderate pain (for pain.).     [provider]  amLODipine (NORVASC) 5 MG tablet TAKE 1 TABLET BY MOUTH DAILY. Patient taking differently: Take 5 mg by mouth daily.  04/18/20   Jake Bathe, MD  antiseptic oral rinse (BIOTENE) LIQD 15 mLs by Mouth Rinse route daily as needed for dry mouth.     [provider]  buPROPion (WELLBUTRIN XL) 150 MG 24 hr tablet Take 1 tablet (150 mg total) by mouth daily. 07/11/20   Mayer Masker, PA-C  CALCIUM-MAGNESIUM-ZINC PO Take 1 tablet by mouth every morning.    [provider]  fesoterodine (TOVIAZ) 4 MG TB24 tablet Take 4 mg by mouth daily.    [provider]  folic acid (FOLVITE) 1 MG tablet TAKE 1 TABLET BY MOUTH DAILY Patient taking differently: Take 1 mg by mouth every evening.  09/04/19   Danford, Orpha Bur D, NP  levothyroxine (SYNTHROID) 75 MCG tablet TAKE 1 TABLET BY MOUTH DAILY BEFORE BREAKFAST. Patient taking differently: Take 75 mcg by mouth  daily before breakfast.  04/21/20   Mayer Masker, PA-C  metoprolol tartrate (LOPRESSOR) 25 MG tablet TAKE 1/2 TABLET BY MOUTH TWICE A DAY. Patient taking differently: Take 12.5 mg by mouth 2 (two) times daily.  02/01/20   Danford, Orpha Bur D, NP  omeprazole (PRILOSEC) 20 MG capsule TAKE 1 CAPSULE BY MOUTH DAILY AS NEEDED FOR HEARTBURN. Patient taking differently: Take 20 mg by mouth at bedtime.  09/04/19   Danford, Jinny Blossom, NP  polyethylene glycol (MIRALAX / GLYCOLAX) packet  Take 17 g by mouth daily.     [provider]  potassium chloride SA (K-DUR) 20 MEQ tablet Take 1 tablet (20 mEq total) by mouth daily. 08/10/19   Jake Bathe, MD  Propylene Glycol (SYSTANE BALANCE) 0.6 % SOLN Place 1 drop into both eyes daily.    [provider]  quinapril (ACCUPRIL) 40 MG tablet Take 1 tablet (40 mg total) by mouth daily. 07/11/20   Abonza, Maritza, PA-C  simvastatin (ZOCOR) 5 MG tablet TAKE 1 TABLET BY MOUTH EVERY EVENING. Patient taking differently: Take 5 mg by mouth every evening.  05/05/20   Mayer Masker, PA-C  spironolactone (ALDACTONE) 25 MG tablet Take 0.5 tablets (12.5 mg total) by mouth daily. 12/31/19   Jake Bathe, MD  VITAMIN D PO Take 1 tablet by mouth at bedtime.    [provider]  XARELTO 15 MG TABS tablet TAKE 1 TABLET BY MOUTH DAILY WITH SUPPER. Patient taking differently: Take 15 mg by mouth daily with supper.  05/20/20   Jake Bathe, MD    Allergies    Tape  Review of Systems   Review of Systems  Constitutional: Negative for fever.  HENT: Negative for ear pain and sore throat.   Eyes: Negative for visual disturbance.  Respiratory: Negative for cough and shortness of breath.   Cardiovascular: Negative for chest pain.  Gastrointestinal: Positive for diarrhea. Negative for abdominal pain, constipation, nausea and vomiting.  Genitourinary: Negative for dysuria.  Musculoskeletal: Negative for back pain.  Skin: Negative for rash.  Neurological: Negative  for headaches.       Altered mental status  All other systems reviewed and are negative.   Physical Exam Updated Vital Signs BP (!) 151/92 (BP Location: Right Arm)   Pulse 90   Temp 97.7 F (36.5 C) (Oral)   Resp 18   LMP  (LMP Unknown)   SpO2 94%   Physical Exam Vitals and nursing note reviewed.  Constitutional:      General: She is not in acute distress.    Appearance: She is well-developed.  HENT:     Head: Normocephalic and atraumatic.  Eyes:     Conjunctiva/sclera: Conjunctivae normal.  Cardiovascular:     Rate and Rhythm: Normal rate and regular rhythm.     Pulses: Normal pulses.     Heart sounds: Normal heart sounds. No murmur heard.   Pulmonary:     Effort: Pulmonary effort is normal. No respiratory distress.     Breath sounds: Normal breath sounds. No wheezing, rhonchi or rales.  Abdominal:     General: Bowel sounds are normal.     Palpations: Abdomen is soft.     Tenderness: There is no abdominal tenderness. There is no guarding or rebound.  Musculoskeletal:     Cervical back: Neck supple.  Skin:    General: Skin is warm and dry.  Neurological:     Mental Status: She is alert.     Comments: Mental Status:  Alert, thought content appropriate, able to give a coherent history. Speech fluent without evidence of aphasia. Able to follow 2 step commands without difficulty.  Cranial Nerves:  II: pupils equal, round, reactive to light III,IV, VI: ptosis not present, extra-ocular motions intact bilaterally  V,VII: smile symmetric, facial light touch sensation equal VIII: hearing grossly normal to voice  X: uvula elevates symmetrically  XI: bilateral shoulder shrug symmetric and strong XII: midline tongue extension without fassiculations Motor:  Normal tone. 5/5 strength of BUE and  BLE major muscle groups including strong and equal grip strength and dorsiflexion/plantar flexion Sensory: light touch normal in all extremities.     ED Results / Procedures /  Treatments   Labs (all labs ordered are listed, but only abnormal results are displayed) Labs Reviewed  CBC WITH DIFFERENTIAL/PLATELET - Abnormal; Notable for the following components:      Result Value   MCV 101.4 (*)    All other components within normal limits  COMPREHENSIVE METABOLIC PANEL - Abnormal; Notable for the following components:   BUN 24 (*)    Creatinine, Ser 1.32 (*)    GFR calc non Af Amer 36 (*)    GFR calc Af Amer 42 (*)    All other components within normal limits  URINALYSIS, ROUTINE W REFLEX MICROSCOPIC - Abnormal; Notable for the following components:   APPearance CLOUDY (*)    Specific Gravity, Urine >1.030 (*)    Leukocytes,Ua TRACE (*)    All other components within normal limits  URINALYSIS, MICROSCOPIC (REFLEX) - Abnormal; Notable for the following components:   Bacteria, UA MANY (*)    All other components within normal limits  ETHANOL  CBG MONITORING, ED    EKG None  Radiology CT Head Wo Contrast  Result Date: 07/23/2020 CLINICAL DATA:  86 year old female with altered mental status. EXAM: CT HEAD WITHOUT CONTRAST TECHNIQUE: Contiguous axial images were obtained from the base of the skull through the vertex without intravenous contrast. COMPARISON:  Head CT dated 06/23/2020. FINDINGS: Brain: Moderate age-related atrophy and chronic microvascular ischemic changes. Small left basal ganglia old lacunar infarct. There is no acute intracranial hemorrhage. No mass effect or midline shift. No extra-axial fluid collection. Vascular: No hyperdense vessel or unexpected calcification. Skull: Normal. Negative for fracture or focal lesion. Sinuses/Orbits: No acute finding. Other: None IMPRESSION: 1. No acute intracranial pathology. 2. Moderate age-related atrophy and chronic microvascular ischemic changes. Electronically Signed   By: Elgie Collard M.D.   On: 07/23/2020 17:13   DG Chest Portable 1 View  Result Date: 07/23/2020 CLINICAL DATA:  86 year old female  with altered mental status. EXAM: PORTABLE CHEST 1 VIEW COMPARISON:  Chest radiograph dated 12/31/2019. FINDINGS: There is moderate cardiomegaly. No vascular congestion or edema. Left lung base atelectasis. No focal consolidation, large pleural effusion, or pneumothorax. Atherosclerotic calcification of the aorta. No acute osseous pathology. IMPRESSION: 1. No acute cardiopulmonary process. 2. Moderate cardiomegaly. Electronically Signed   By: Elgie Collard M.D.   On: 07/23/2020 17:07    Procedures Procedures (including critical care time)  Medications Ordered in ED Medications - No data to display  ED Course  I have reviewed the triage vital signs and the nursing notes.  Pertinent labs & imaging results that were available during my care of the patient were reviewed by me and considered in my medical decision making (see chart for details).    MDM Rules/Calculators/A&P                          86 year old female presenting for evaluation of an episode of altered mental status.  On my evaluation she is alert and oriented and does not have any focal deficits on exam.  Vital signs are reassuring.  Will obtain labs, UA, chest x-ray, CT head.  Reviewed/interpreted labs CBC nonacute CMP nonacute EtOH negative UA With trace leuks, no nitrites.  Low suspicion for UTI  Reviewed/interpreted imaging CXR with no acute cardiopulmonary process CT head with chronic changes but no  acute abnormality  Patient seems to be at her baseline on my evaluation and we have not found any evidence of for infection, abnormality on head CT, or any other concerns at would warrant admission to the hospital.  Patient appears to be stable for discharge at this time.  She will need to follow-up with her PCP and may need dementia work-up.  Advised on return precautions.  Patient discharged in stable condition.  Pt seen in conjunction with Dr. Silverio Lay who personally evaluated her and is in agreement with plan.   Final  Clinical Impression(s) / ED Diagnoses Final diagnoses:  Altered mental status, unspecified altered mental status type    Rx / DC Orders ED Discharge Orders    None       Karrie Meres, PA-C 07/23/20 1842    Charlynne Pander, MD 07/23/20 [phone]

## 2020-07-23 NOTE — ED Triage Notes (Signed)
Pt entered triage on walker-when asked why she is in the ED-states "I don't know"-pt's daughter states she called her ~2pm with talking about her husband missing and he has been deceased x 3 years-last known normal with phone call with grandson who is a nurse was ~7pm yesterday-she states in addition to any other testing that she would like an alcohol level drawn-pt states she has been having diarrhea x 4 days

## 2020-07-23 NOTE — ED Notes (Signed)
Patient transported to CT 

## 2020-07-23 NOTE — ED Notes (Signed)
PT given bedside commode to obtain UA

## 2020-07-23 NOTE — Discharge Instructions (Signed)
Please follow up with your primary care provider within 5-7 days for re-evaluation of your symptoms. If you do not have a primary care provider, information for a healthcare clinic has been provided for you to make arrangements for follow up care. Please return to the emergency department for any new or worsening symptoms. ° °

## 2020-07-30 ENCOUNTER — Encounter: Payer: Self-pay | Admitting: Physician Assistant

## 2020-08-02 ENCOUNTER — Other Ambulatory Visit: Payer: Self-pay

## 2020-08-02 ENCOUNTER — Ambulatory Visit
Admission: EM | Admit: 2020-08-02 | Discharge: 2020-08-02 | Disposition: A | Discharge status: Home / Self Care | Payer: Medicare PPO | Attending: Physician Assistant | Admitting: Physician Assistant

## 2020-08-02 ENCOUNTER — Encounter: Payer: Self-pay | Admitting: Emergency Medicine

## 2020-08-02 DIAGNOSIS — N3090 Cystitis, unspecified without hematuria: Secondary | ICD-10-CM | POA: Diagnosis not present

## 2020-08-02 LAB — POCT URINALYSIS DIP (MANUAL ENTRY)
Bilirubin, UA: NEGATIVE
Glucose, UA: NEGATIVE mg/dL
Nitrite, UA: NEGATIVE
Spec Grav, UA: >=1.03 — AB (ref 1.010–1.025)
Urobilinogen, UA: 0.2 E.U./dL
pH, UA: 5.5 (ref 5.0–8.0)

## 2020-08-02 MED ORDER — CEPHALEXIN 500 MG PO CAPS
500.0000 mg | ORAL_CAPSULE | Freq: Three times a day (TID) | ORAL | 0 refills | Status: DC
Start: 2020-08-02 — End: 2020-09-03

## 2020-08-02 NOTE — Discharge Instructions (Signed)
Your urine was positive for an urinary tract infection. Start keflex as directed. Urine culture sent. Keep hydrated, urine should be clear to pale yellow in color. Monitor for any worsening of symptoms, fever, worsening abdominal pain, nausea/vomiting, flank pain, confusion, weakness, go to the ED for further evaluation.

## 2020-08-02 NOTE — ED Triage Notes (Signed)
Pt here with daughter for possible UTI after mother who lives alone called and was asking different questions; pt was seen last week for same nd daughter is worried that she has a UTI; per daughter sts she is having diarrhea

## 2020-08-03 NOTE — ED Provider Notes (Signed)
[facility]    CSN: [id number] Arrival date & time: 08/02/20  1405      History   Chief Complaint Chief Complaint  Patient presents with  . Diarrhea  . Altered Mental Status    HPI Christina Ortiz is a 86 y.o. female.   86 year old female comes in with daughter for worries of UTI.  Daughter states they were seen at the ED 07/23/2020 for altered mental status.  Patient had called daughter confused.  She was brought to the ED, with normal head CT, unremarkable lab work.  Daughter states was told dipstick without finding, but noted on my chart there was many bacteria, and wonders if patient has UTI.  Patient again called daughter confused today, looking for family members who visited last week.  Daughter states had not received these phone calls between now and ED visit last week. Unknown if patient was stable throughout this time. Patient denies any pain, fever, discomfort. She continues to have diarrhea noted from ED visit.      Past Medical History:  Diagnosis Date  . Acute bronchitis 10/2017  . Arthritis   . CKD (chronic kidney disease)    Xarelto dose is 15 mg QD  . Coronary artery disease    a. s/p PCI/stenting to RCA and LAD ([facility] in [place]) in 2006; b. Nuc 1/15: No ischemia, EF 70%  . Depression   . Gallstones   . Gastritis   . GERD (gastroesophageal reflux disease)   . Hiatal hernia   . Hip fracture (HCC)   . History of cholecystectomy   . History of echocardiogram    a. Echo 1/15: Moderate LVH, EF 55-60%, impaired relaxation, mild AS  . Hyperlipidemia   . Hypertension   . Hypokalemia   . Hypothyroidism   . Mobitz type 1 second degree atrioventricular block    a. Event Monitor 3/15: NSR, first-degree AV block, second-degree AB block - Mobitz 1, PACs, NSVT (5 beats)  . PAF (paroxysmal atrial fibrillation) (HCC)   . Thyroid disease     Patient Active Problem List   Diagnosis Date Noted  . Surgical counseling visit 12/10/2019  . History  of esophageal stricture 11/02/2019  . Muscular deconditioning 11/01/2019  . Abnormal liver function tests   . Common bile duct calculus 10/16/2019  . Abnormal urinalysis 09/28/2019  . Adult failure to thrive syndrome 09/28/2019  . Dysphagia 09/06/2018  . Pain   . Closed fracture of femur, greater trochanter (HCC) 06/04/2018  . Hypoxia 06/03/2018  . Pain in right arm 04/10/2018  . Fall 03/31/2018  . Fracture of shaft of humerus 03/31/2018  . Disorder of skeletal muscle 03/31/2018  . Hypothermia 03/31/2018  . Healthcare maintenance 01/17/2018  . Nocturia 01/17/2018  . Macrocytosis 11/22/2017  . Acute bronchitis due to respiratory syncytial virus 11/09/2017  . Acute respiratory failure (HCC) 11/09/2017  . Hypertension 11/09/2017  . Paroxysmal atrial fibrillation (HCC) 11/09/2017  . Coronary artery disease 11/09/2017  . CKD (chronic kidney disease) stage 2, GFR 60-89 ml/min 11/09/2017  . Hypothyroidism 11/09/2017  . Dyspnea 11/08/2017  . Upper respiratory infection 11/08/2017  . Serum creatinine raised 06/21/2017  . Ankle edema 06/21/2017  . Diarrhea 05/25/2017  . Hypokalemia 05/25/2017  . Alcohol abuse 05/25/2017  . Recurrent depression (HCC) 03/30/2017  . Low back pain 03/30/2017  . Influenza 01/22/2017  . Coronary atherosclerosis 04/04/2015  . Hypertensive disorder 04/04/2015  . Hyperlipidemia 04/04/2015  . Ex-smoker 04/04/2015  . Chronic anticoagulation 04/04/2015  Past Surgical History:  Procedure Laterality Date  . ANGIOPLASTY  2006   with sent placement  . BACK SURGERY     Dr Shon Baton  . BILIARY DILATION  10/18/2019   Procedure: BILIARY DILATION;  Surgeon: Rachael Fee, MD;  Location: [facility];  Service: Endoscopy;;  . BILIARY DILATION  12/17/2019   Procedure: BILIARY DILATION;  Surgeon: [name], MD;  Location: [facility] ENDOSCOPY;  Service: Endoscopy;;  . BILIARY DILATION  02/07/2020   Procedure: BILIARY DILATION;  Surgeon: Lemar Lofty.,  MD;  Location: [facility] ENDOSCOPY;  Service: Gastroenterology;;  . BILIARY STENT PLACEMENT N/A 10/18/2019   Procedure: BILIARY STENT PLACEMENT;  Surgeon: Rachael Fee, MD;  Location: [facility] ENDOSCOPY;  Service: Endoscopy;  Laterality: N/A;  . BILIARY STENT PLACEMENT N/A 12/17/2019   Procedure: BILIARY STENT PLACEMENT;  Surgeon: [name], MD;  Location: [facility] ENDOSCOPY;  Service: Endoscopy;  Laterality: N/A;  . BILIARY STENT PLACEMENT  02/07/2020   Procedure: BILIARY STENT PLACEMENT;  Surgeon: Meridee Score Netty Starring., MD;  Location: [facility] ENDOSCOPY;  Service: Gastroenterology;;  . BLEPHAROPLASTY    . BREAST LUMPECTOMY Left   . CARDIAC CATHETERIZATION  2006  . CATARACT EXTRACTION Bilateral   . CHOLECYSTECTOMY    . DILATION AND CURETTAGE OF UTERUS    . ENDOSCOPIC RETROGRADE CHOLANGIOPANCREATOGRAPHY (ERCP) WITH PROPOFOL N/A 12/17/2019   Procedure: ENDOSCOPIC RETROGRADE CHOLANGIOPANCREATOGRAPHY (ERCP) WITH PROPOFOL;  Surgeon: [name], MD;  Location: [facility] ENDOSCOPY;  Service: Endoscopy;  Laterality: N/A;  . ENDOSCOPIC RETROGRADE CHOLANGIOPANCREATOGRAPHY (ERCP) WITH PROPOFOL N/A 02/07/2020   Procedure: ENDOSCOPIC RETROGRADE CHOLANGIOPANCREATOGRAPHY (ERCP) WITH PROPOFOL;  Surgeon: Meridee Score Netty Starring., MD;  Location: [facility] ENDOSCOPY;  Service: Gastroenterology;  Laterality: N/A;  . ERCP N/A 10/18/2019   Procedure: ENDOSCOPIC RETROGRADE CHOLANGIOPANCREATOGRAPHY (ERCP);  Surgeon: Rachael Fee, MD;  Location: [facility] END[facility];  Service: Endoscopy;  Laterality: N/A;  . ESOPHAGOGASTRODUODENOSCOPY (EGD) WITH PROPOFOL N/A 12/17/2019   Procedure: ESOPHAGOGASTRODUODENOSCOPY (EGD) WITH PROPOFOL;  Surgeon: [name], MD;  Location: [facility] ENDOSCOPY;  Service: Endoscopy;  Laterality: N/A;  . KYPHOPLASTY N/A 01/03/2020   Procedure: KYPHOPLASTY LUMBAR TWO;  Surgeon: Venita Lick, MD;  Location: [facility] OR;  Service: Orthopedics;  Laterality: N/A;  . REMOVAL OF STONES  10/18/2019   Procedure: REMOVAL OF STONES;   Surgeon: Rachael Fee, MD;  Location: [facility] ENDOSCOPY;  Service: Endoscopy;;  . REMOVAL OF STONES  12/17/2019   Procedure: REMOVAL OF STONES;  Surgeon: [name], MD;  Location: [facility] ENDOSCOPY;  Service: Endoscopy;;  . REMOVAL OF STONES  02/07/2020   Procedure: REMOVAL OF STONES;  Surgeon: Lemar Lofty., MD;  Location: [facility] ENDOSCOPY;  Service: Gastroenterology;;  . Gaspar Bidding DILATION N/A 12/17/2019   Procedure: Gaspar Bidding DILATION;  Surgeon: [name], MD;  Location: [facility] ENDOSCOPY;  Service: Endoscopy;  Laterality: N/A;  . SPHINCTEROTOMY  10/18/2019   Procedure: SPHINCTEROTOMY;  Surgeon: Rachael Fee, MD;  Location: [facility] ENDOSCOPY;  Service: Endoscopy;;  . Dennison Mascot  12/17/2019   Procedure: Dennison Mascot;  Surgeon: [name], MD;  Location: [facility] ENDOSCOPY;  Service: Endoscopy;;  . Burman Freestone CHOLANGIOSCOPY N/A 02/07/2020   Procedure: WGNFAOZH CHOLANGIOSCOPY;  Surgeon: Lemar Lofty., MD;  Location: [facility] ENDOSCOPY;  Service: Gastroenterology;  Laterality: N/A;  Burman Freestone LITHOTRIPSY N/A 02/07/2020   Procedure: YQMVHQIO LITHOTRIPSY;  Surgeon: Lemar Lofty., MD;  Location: [facility] ENDOSCOPY;  Service: Gastroenterology;  Laterality: N/A;  . STENT REMOVAL  12/17/2019   Procedure: STENT REMOVAL;  Surgeon: [name], MD;  Location: [facility]  ENDOSCOPY;  Service: Endoscopy;;  . STENT REMOVAL  02/07/2020   Procedure: STENT REMOVAL;  Surgeon: Lemar Lofty., MD;  Location: [facility] ENDOSCOPY;  Service: Gastroenterology;;    OB History   No obstetric history on file.      Home Medications    Prior to Admission medications   Medication Sig Start Date End Date Taking? Authorizing Provider  acetaminophen (TYLENOL) 325 MG tablet Take 650 mg by mouth every 6 (six) hours as needed for moderate pain (for pain.).     [provider]  amLODipine (NORVASC) 5 MG tablet TAKE 1 TABLET BY MOUTH DAILY. Patient taking differently: Take 5 mg by mouth daily.  04/18/20    Jake Bathe, MD  antiseptic oral rinse (BIOTENE) LIQD 15 mLs by Mouth Rinse route daily as needed for dry mouth.     [provider]  buPROPion (WELLBUTRIN XL) 150 MG 24 hr tablet Take 1 tablet (150 mg total) by mouth daily. 07/11/20   Mayer Masker, PA-C  CALCIUM-MAGNESIUM-ZINC PO Take 1 tablet by mouth every morning.    [provider]  cephALEXin (KEFLEX) 500 MG capsule Take 1 capsule (500 mg total) by mouth 3 (three) times daily. 08/02/20   Cathie Hoops, Yuvonne Lanahan V, PA-C  fesoterodine (TOVIAZ) 4 MG TB24 tablet Take 4 mg by mouth daily.    [provider]  folic acid (FOLVITE) 1 MG tablet TAKE 1 TABLET BY MOUTH DAILY Patient taking differently: Take 1 mg by mouth every evening.  09/04/19   Danford, Orpha Bur D, NP  levothyroxine (SYNTHROID) 75 MCG tablet TAKE 1 TABLET BY MOUTH DAILY BEFORE BREAKFAST. Patient taking differently: Take 75 mcg by mouth daily before breakfast.  04/21/20   Mayer Masker, PA-C  metoprolol tartrate (LOPRESSOR) 25 MG tablet TAKE 1/2 TABLET BY MOUTH TWICE A DAY. Patient taking differently: Take 12.5 mg by mouth 2 (two) times daily.  02/01/20   Danford, Orpha Bur D, NP  omeprazole (PRILOSEC) 20 MG capsule TAKE 1 CAPSULE BY MOUTH DAILY AS NEEDED FOR HEARTBURN. Patient taking differently: Take 20 mg by mouth at bedtime.  09/04/19   Danford, Orpha Bur D, NP  polyethylene glycol (MIRALAX / GLYCOLAX) packet Take 17 g by mouth daily.     [provider]  potassium chloride SA (K-DUR) 20 MEQ tablet Take 1 tablet (20 mEq total) by mouth daily. 08/10/19   Jake Bathe, MD  Propylene Glycol (SYSTANE BALANCE) 0.6 % SOLN Place 1 drop into both eyes daily.    [provider]  quinapril (ACCUPRIL) 40 MG tablet Take 1 tablet (40 mg total) by mouth daily. 07/11/20   Abonza, Maritza, PA-C  simvastatin (ZOCOR) 5 MG tablet TAKE 1 TABLET BY MOUTH EVERY EVENING. Patient taking differently: Take 5 mg by mouth every evening.  05/05/20   Mayer Masker, PA-C  spironolactone  (ALDACTONE) 25 MG tablet Take 0.5 tablets (12.5 mg total) by mouth daily. 12/31/19   Jake Bathe, MD  VITAMIN D PO Take 1 tablet by mouth at bedtime.    [provider]  XARELTO 15 MG TABS tablet TAKE 1 TABLET BY MOUTH DAILY WITH SUPPER. Patient taking differently: Take 15 mg by mouth daily with supper.  05/20/20   Jake Bathe, MD    Family History Family History  Problem Relation Age of Onset  . Heart disease Mother   . Heart attack Mother   . Lung cancer Father   . Lymphoma Brother   . Heart failure Brother   . Esophageal cancer  Paternal Uncle   . Colon cancer Neg Hx   . Colon polyps Neg Hx   . Rectal cancer Neg Hx   . Stomach cancer Neg Hx     Social History Social History   Tobacco Use  . Smoking status: Former Smoker    Packs/day: 0.50    Years: 67.00    Pack years: 33.50    Types: Cigarettes    Quit date: 10/24/2014    Years since quitting: 5.7  . Smokeless tobacco: Never Used  . Tobacco comment: quit 2015  Vaping Use  . Vaping Use: Never used  Substance Use Topics  . Alcohol use: Yes    Alcohol/week: 10.0 - 14.0 standard drinks    Types: 10 - 14 Shots of liquor per week    Comment: vodka  . Drug use: No     Allergies   Tape   Review of Systems Review of Systems  Reason unable to perform ROS: See HPI as above.     Physical Exam Triage Vital Signs ED Triage Vitals  Enc Vitals Group     BP 08/02/20 1448 (!) 142/84     Pulse Rate 08/02/20 1448 78     Resp 08/02/20 1448 18     Temp 08/02/20 1448 98.8 F (37.1 C)     Temp Source 08/02/20 1448 Oral     SpO2 08/02/20 1448 94 %     Weight --      Height --      Head Circumference --      Peak Flow --      Pain Score 08/02/20 1440 0     Pain Loc --      Pain Edu? --      Excl. in GC? --    No data found.  Updated Vital Signs BP (!) 142/84 (BP Location: Right Arm)   Pulse 78   Temp 98.8 F (37.1 C) (Oral)   Resp 18   LMP  (LMP Unknown)   SpO2 94%   Physical  Exam Constitutional:      General: She is not in acute distress.    Appearance: Normal appearance. She is well-developed. She is not toxic-appearing or diaphoretic.  HENT:     Head: Normocephalic and atraumatic.     Mouth/Throat:     Mouth: Mucous membranes are moist.     Pharynx: Oropharynx is clear. Uvula midline.  Eyes:     Extraocular Movements: Extraocular movements intact.     Conjunctiva/sclera: Conjunctivae normal.     Pupils: Pupils are equal, round, and reactive to light.  Cardiovascular:     Rate and Rhythm: Normal rate and regular rhythm.  Pulmonary:     Effort: Pulmonary effort is normal. No respiratory distress.     Comments: LCTAB Abdominal:     General: Bowel sounds are normal.     Palpations: Abdomen is soft.     Tenderness: There is no abdominal tenderness. There is no right CVA tenderness, left CVA tenderness, guarding or rebound.  Musculoskeletal:     Cervical back: Normal range of motion and neck supple.  Skin:    General: Skin is warm and dry.  Neurological:     Mental Status: She is alert and oriented to person, place, and time.     GCS: GCS eye subscore is 4. GCS verbal subscore is 5. GCS motor subscore is 6.     Comments: Cranial nerves II-XII grossly intact. Strength 5/5 bilaterally for upper and lower extremity.  Sensation intact. Normal coordination with normal finger to nose. Negative pronator drift, romberg. Gait intact. Able to ambulate on own without difficulty.       UC Treatments / Results  Labs (all labs ordered are listed, but only abnormal results are displayed) Labs Reviewed  POCT URINALYSIS DIP (MANUAL ENTRY) - Abnormal; Notable for the following components:      Result Value   Ketones, POC UA trace (5) (*)    Spec Grav, UA >=1.030 (*)    Blood, UA trace-intact (*)    Protein Ur, POC trace (*)    Leukocytes, UA Small (1+) (*)    All other components within normal limits  URINE CULTURE    EKG   Radiology No results  found.  Procedures Procedures (including critical care time)  Medications Ordered in UC Medications - No data to display  Initial Impression / Assessment and Plan / UC Course  I have reviewed the triage vital signs and the nursing notes.  Pertinent labs & imaging results that were available during my care of the patient were reviewed by me and considered in my medical decision making (see chart for details).    Patient on exam alert and oriented x4.  Neurological exam grossly intact without focal deficits.  Urine dipstick was trace blood, small leukocytes.  Cover for UTI with Keflex.  Urine culture sent.  Push fluids.  Return precautions given.  Otherwise to follow-up with PCP for recheck.  Final Clinical Impressions(s) / UC Diagnoses   Final diagnoses:  Cystitis    ED Prescriptions    Medication Sig Dispense Auth. Provider   cephALEXin (KEFLEX) 500 MG capsule Take 1 capsule (500 mg total) by mouth 3 (three) times daily. 21 capsule Belinda Fisher, PA-C     PDMP not reviewed this encounter.   Belinda Fisher, PA-C 08/03/20 [id number]

## 2020-08-04 DIAGNOSIS — N3941 Urge incontinence: Secondary | ICD-10-CM | POA: Diagnosis not present

## 2020-08-04 DIAGNOSIS — R8271 Bacteriuria: Secondary | ICD-10-CM | POA: Diagnosis not present

## 2020-08-04 DIAGNOSIS — R351 Nocturia: Secondary | ICD-10-CM | POA: Diagnosis not present

## 2020-08-05 LAB — URINE CULTURE: Special Requests: NORMAL

## 2020-08-07 ENCOUNTER — Other Ambulatory Visit: Payer: Self-pay | Admitting: Physician Assistant

## 2020-08-21 ENCOUNTER — Other Ambulatory Visit: Payer: Self-pay | Admitting: Physician Assistant

## 2020-08-21 ENCOUNTER — Other Ambulatory Visit: Payer: Self-pay | Admitting: Adult Health

## 2020-09-06 ENCOUNTER — Other Ambulatory Visit (HOSPITAL_COMMUNITY)
Admission: RE | Admit: 2020-09-06 | Discharge: 2020-09-06 | Disposition: A | Discharge status: Home / Self Care | Payer: Medicare PPO | Source: Ambulatory Visit | Attending: Gastroenterology | Admitting: Gastroenterology

## 2020-09-06 DIAGNOSIS — Z20822 Contact with and (suspected) exposure to covid-19: Secondary | ICD-10-CM | POA: Diagnosis not present

## 2020-09-06 DIAGNOSIS — Z01812 Encounter for preprocedural laboratory examination: Secondary | ICD-10-CM | POA: Insufficient documentation

## 2020-09-06 LAB — SARS CORONAVIRUS 2 (TAT 6-24 HRS): SARS Coronavirus 2: NEGATIVE

## 2020-09-09 ENCOUNTER — Encounter (HOSPITAL_COMMUNITY): Payer: Self-pay | Admitting: Gastroenterology

## 2020-09-09 ENCOUNTER — Telehealth: Payer: Self-pay | Admitting: Gastroenterology

## 2020-09-09 NOTE — Telephone Encounter (Signed)
I spoke with the patients daughter and we discussed all information pertaining to the procedure tomorrow.  She states the pt did have COVID testing and has held blood thinner. She will call with any further concerns

## 2020-09-09 NOTE — Progress Notes (Signed)
I received a return call from [name], Ms Swenor's daughter. Mrs Dorothey Baseman repoprts that he rmother has not had any complaints of chest pain or shortness of breath. Ms Wheller  tested negative for Covid_9/11/21_ and has been in quarantine since that time, at her home or her daughter's home.  Mrs. Dorothey Baseman reports that last dose for Noel Christmas was Sunday, 09/07/20.  Mrs. Dorothey Baseman said that she instructed Ms Bose to not take any medications on the am of Endo procedure, I encouraged her to have patient take Metoprolol and Levothyroxine.   I informmed Mrs. Strickland that Ms Demo that someone needs to stay with Ms Portier for 24 hors after procedure.

## 2020-09-09 NOTE — Telephone Encounter (Signed)
Pt's daughter is requesting a call back from a nurse to discuss the pt's appt tomorrow.

## 2020-09-10 ENCOUNTER — Ambulatory Visit (HOSPITAL_COMMUNITY): Payer: Medicare PPO | Admitting: Certified Registered"

## 2020-09-10 ENCOUNTER — Ambulatory Visit (HOSPITAL_COMMUNITY)
Admission: RE | Admit: 2020-09-10 | Discharge: 2020-09-10 | Disposition: A | Discharge status: Home / Self Care | Payer: Medicare PPO | Attending: Gastroenterology | Admitting: Gastroenterology

## 2020-09-10 ENCOUNTER — Ambulatory Visit (HOSPITAL_COMMUNITY): Payer: Medicare PPO

## 2020-09-10 ENCOUNTER — Encounter (HOSPITAL_COMMUNITY)
Admission: RE | Disposition: A | Discharge status: Home / Self Care | Payer: Self-pay | Source: Home / Self Care | Attending: Gastroenterology

## 2020-09-10 ENCOUNTER — Other Ambulatory Visit: Payer: Self-pay

## 2020-09-10 ENCOUNTER — Encounter (HOSPITAL_COMMUNITY): Payer: Self-pay | Admitting: Gastroenterology

## 2020-09-10 DIAGNOSIS — J9600 Acute respiratory failure, unspecified whether with hypoxia or hypercapnia: Secondary | ICD-10-CM | POA: Diagnosis not present

## 2020-09-10 DIAGNOSIS — I129 Hypertensive chronic kidney disease with stage 1 through stage 4 chronic kidney disease, or unspecified chronic kidney disease: Secondary | ICD-10-CM | POA: Insufficient documentation

## 2020-09-10 DIAGNOSIS — K8050 Calculus of bile duct without cholangitis or cholecystitis without obstruction: Secondary | ICD-10-CM | POA: Insufficient documentation

## 2020-09-10 DIAGNOSIS — K831 Obstruction of bile duct: Secondary | ICD-10-CM

## 2020-09-10 DIAGNOSIS — K5710 Diverticulosis of small intestine without perforation or abscess without bleeding: Secondary | ICD-10-CM | POA: Diagnosis not present

## 2020-09-10 DIAGNOSIS — N189 Chronic kidney disease, unspecified: Secondary | ICD-10-CM | POA: Insufficient documentation

## 2020-09-10 DIAGNOSIS — K3189 Other diseases of stomach and duodenum: Secondary | ICD-10-CM | POA: Insufficient documentation

## 2020-09-10 DIAGNOSIS — Z9049 Acquired absence of other specified parts of digestive tract: Secondary | ICD-10-CM | POA: Diagnosis not present

## 2020-09-10 DIAGNOSIS — Z8249 Family history of ischemic heart disease and other diseases of the circulatory system: Secondary | ICD-10-CM | POA: Diagnosis not present

## 2020-09-10 DIAGNOSIS — Z9689 Presence of other specified functional implants: Secondary | ICD-10-CM | POA: Diagnosis not present

## 2020-09-10 DIAGNOSIS — Z8719 Personal history of other diseases of the digestive system: Secondary | ICD-10-CM | POA: Diagnosis not present

## 2020-09-10 DIAGNOSIS — M1990 Unspecified osteoarthritis, unspecified site: Secondary | ICD-10-CM | POA: Diagnosis not present

## 2020-09-10 DIAGNOSIS — K449 Diaphragmatic hernia without obstruction or gangrene: Secondary | ICD-10-CM | POA: Diagnosis not present

## 2020-09-10 DIAGNOSIS — Z4659 Encounter for fitting and adjustment of other gastrointestinal appliance and device: Secondary | ICD-10-CM | POA: Diagnosis not present

## 2020-09-10 DIAGNOSIS — I2510 Atherosclerotic heart disease of native coronary artery without angina pectoris: Secondary | ICD-10-CM | POA: Diagnosis not present

## 2020-09-10 DIAGNOSIS — Z955 Presence of coronary angioplasty implant and graft: Secondary | ICD-10-CM | POA: Diagnosis not present

## 2020-09-10 DIAGNOSIS — Z87891 Personal history of nicotine dependence: Secondary | ICD-10-CM | POA: Diagnosis not present

## 2020-09-10 DIAGNOSIS — K838 Other specified diseases of biliary tract: Secondary | ICD-10-CM | POA: Diagnosis not present

## 2020-09-10 DIAGNOSIS — I480 Paroxysmal atrial fibrillation: Secondary | ICD-10-CM | POA: Diagnosis not present

## 2020-09-10 DIAGNOSIS — Z9889 Other specified postprocedural states: Secondary | ICD-10-CM

## 2020-09-10 HISTORY — PX: STENT REMOVAL: SHX6421

## 2020-09-10 HISTORY — PX: BILIARY DILATION: SHX6850

## 2020-09-10 HISTORY — PX: ENDOSCOPIC RETROGRADE CHOLANGIOPANCREATOGRAPHY (ERCP) WITH PROPOFOL: SHX5810

## 2020-09-10 HISTORY — PX: REMOVAL OF STONES: SHX5545

## 2020-09-10 SURGERY — ENDOSCOPIC RETROGRADE CHOLANGIOPANCREATOGRAPHY (ERCP) WITH PROPOFOL
Anesthesia: General

## 2020-09-10 MED ORDER — PHENYLEPHRINE HCL-NACL 10-0.9 MG/250ML-% IV SOLN
INTRAVENOUS | Status: DC | PRN
  Administered 2020-09-10: 50 ug/min via INTRAVENOUS

## 2020-09-10 MED ORDER — ONDANSETRON HCL 4 MG/2ML IJ SOLN
INTRAMUSCULAR | Status: DC | PRN
  Administered 2020-09-10: 4 mg via INTRAVENOUS

## 2020-09-10 MED ORDER — ROCURONIUM BROMIDE 10 MG/ML (PF) SYRINGE
PREFILLED_SYRINGE | INTRAVENOUS | Status: DC | PRN
  Administered 2020-09-10: 100 mg via INTRAVENOUS

## 2020-09-10 MED ORDER — IOHEXOL 300 MG/ML  SOLN
INTRAMUSCULAR | Status: DC | PRN
  Administered 2020-09-10: 25 mL

## 2020-09-10 MED ORDER — LACTATED RINGERS IV SOLN: INTRAVENOUS | Status: DC | PRN

## 2020-09-10 MED ORDER — INDOMETHACIN 50 MG RE SUPP
RECTAL | Status: AC
  Filled 2020-09-10: qty 2

## 2020-09-10 MED ORDER — PROPOFOL 10 MG/ML IV BOLUS
INTRAVENOUS | Status: DC | PRN
  Administered 2020-09-10: 100 mg via INTRAVENOUS

## 2020-09-10 MED ORDER — GLUCAGON HCL RDNA (DIAGNOSTIC) 1 MG IJ SOLR
INTRAMUSCULAR | Status: DC | PRN
  Administered 2020-09-10 (×3): .25 mg via INTRAVENOUS

## 2020-09-10 MED ORDER — SUGAMMADEX SODIUM 200 MG/2ML IV SOLN
INTRAVENOUS | Status: DC | PRN
  Administered 2020-09-10: 120 mg via INTRAVENOUS

## 2020-09-10 MED ORDER — LIDOCAINE 2% (20 MG/ML) 5 ML SYRINGE
INTRAMUSCULAR | Status: DC | PRN
  Administered 2020-09-10: 60 mg via INTRAVENOUS

## 2020-09-10 MED ORDER — CIPROFLOXACIN IN D5W 400 MG/200ML IV SOLN
INTRAVENOUS | Status: AC
  Filled 2020-09-10: qty 200

## 2020-09-10 MED ORDER — PHENYLEPHRINE 40 MCG/ML (10ML) SYRINGE FOR IV PUSH (FOR BLOOD PRESSURE SUPPORT)
PREFILLED_SYRINGE | INTRAVENOUS | Status: DC | PRN
  Administered 2020-09-10: 120 ug via INTRAVENOUS

## 2020-09-10 MED ORDER — FENTANYL CITRATE (PF) 100 MCG/2ML IJ SOLN
INTRAMUSCULAR | Status: DC | PRN
  Administered 2020-09-10: 100 ug via INTRAVENOUS

## 2020-09-10 MED ORDER — INDOMETHACIN 50 MG RE SUPP
RECTAL | Status: DC | PRN
  Administered 2020-09-10: 100 mg via RECTAL

## 2020-09-10 MED ORDER — CIPROFLOXACIN IN D5W 400 MG/200ML IV SOLN
INTRAVENOUS | Status: DC | PRN
  Administered 2020-09-10: 400 mg via INTRAVENOUS

## 2020-09-10 MED ORDER — GLUCAGON HCL RDNA (DIAGNOSTIC) 1 MG IJ SOLR
INTRAMUSCULAR | Status: AC
  Filled 2020-09-10: qty 2

## 2020-09-10 MED ORDER — SODIUM CHLORIDE 0.9 % IV SOLN: INTRAVENOUS | Status: DC

## 2020-09-10 NOTE — Transfer of Care (Signed)
Immediate Anesthesia Transfer of Care Note  Patient: Christina Ortiz  Procedure(s) Performed: ENDOSCOPIC RETROGRADE CHOLANGIOPANCREATOGRAPHY (ERCP) WITH PROPOFOL (N/A ) STENT REMOVAL REMOVAL OF STONES BILIARY DILATION  Patient Location: Endoscopy Unit  Anesthesia Type:General  Level of Consciousness: awake and patient cooperative  Airway & Oxygen Therapy: Patient Spontanous Breathing  Post-op Assessment: Report given to RN  Post vital signs: Reviewed and stable  Last Vitals:  Vitals Value Taken Time  BP    Temp    Pulse    Resp    SpO2      Last Pain:  Vitals:   09/10/20 1008  TempSrc: Oral  PainSc: 0-No pain         Complications: No complications documented.

## 2020-09-10 NOTE — Discharge Instructions (Signed)
YOU HAD AN ENDOSCOPIC PROCEDURE TODAY: Refer to the procedure report and other information in the discharge instructions given to you for any specific questions about what was found during the examination. If this information does not answer your questions, please call [place] office at [phone] to clarify.   YOU SHOULD EXPECT: Some feelings of bloating in the abdomen. Passage of more gas than usual. Walking can help get rid of the air that was put into your GI tract during the procedure and reduce the bloating. If you had a lower endoscopy (such as a colonoscopy or flexible sigmoidoscopy) you may notice spotting of blood in your stool or on the toilet paper. Some abdominal soreness may be present for a day or two, also.  DIET: Your first meal following the procedure should be a light meal and then it is ok to progress to your normal diet. A half-sandwich or bowl of soup is an example of a good first meal. Heavy or fried foods are harder to digest and may make you feel nauseous or bloated. Drink plenty of fluids but you should avoid alcoholic beverages for 24 hours. If you had a esophageal dilation, please see attached instructions for diet.    ACTIVITY: Your care partner should take you home directly after the procedure. You should plan to take it easy, moving slowly for the rest of the day. You can resume normal activity the day after the procedure however YOU SHOULD NOT DRIVE, use power tools, machinery or perform tasks that involve climbing or major physical exertion for 24 hours (because of the sedation medicines used during the test).   SYMPTOMS TO REPORT IMMEDIATELY: A gastroenterologist can be reached at any hour. Please call [phone]  for any of the following symptoms:   Following upper endoscopy (EGD, EUS, ERCP, esophageal dilation) Vomiting of blood or coffee ground material  New, significant abdominal pain  New, significant chest pain or pain under the shoulder blades  Painful or  persistently difficult swallowing  New shortness of breath  Black, tarry-looking or red, bloody stools  FOLLOW UP:  If any biopsies were taken you will be contacted by phone or by letter within the next 1-3 weeks. Call [phone]  if you have not heard about the biopsies in 3 weeks.  Please also call with any specific questions about appointments or follow up tests.  

## 2020-09-10 NOTE — Anesthesia Procedure Notes (Signed)

## 2020-09-10 NOTE — Anesthesia Postprocedure Evaluation (Signed)
Anesthesia Post Note  Patient: Christina Ortiz  Procedure(s) Performed: ENDOSCOPIC RETROGRADE CHOLANGIOPANCREATOGRAPHY (ERCP) WITH PROPOFOL (N/A ) STENT REMOVAL REMOVAL OF STONES BILIARY DILATION     Patient location during evaluation: PACU Anesthesia Type: General Level of consciousness: awake and alert and oriented Pain management: pain level controlled Vital Signs Assessment: post-procedure vital signs reviewed and stable Respiratory status: spontaneous breathing, nonlabored ventilation and respiratory function stable Cardiovascular status: blood pressure returned to baseline and stable Postop Assessment: no apparent nausea or vomiting Anesthetic complications: no   No complications documented.  Last Vitals:  Vitals:   09/10/20 1213 09/10/20 1220  BP: (!) 179/107 (!) 175/99  Pulse: 61 (!) 59  Resp: (!) 21 16  Temp:    SpO2: 90% 90%    Last Pain:  Vitals:   09/10/20 1220  TempSrc:   PainSc: 0-No pain                 [name]

## 2020-09-10 NOTE — Op Note (Signed)
[facility] Patient Name: Christina Ortiz Procedure Date : 09/10/2020 MRN: [id number] Attending MD: Justice Britain , MD Date of Birth: 12/31/1928 CSN: [id number] Age: 86 Admit Type: Outpatient Procedure:                ERCP Indications:              Bile duct stone(s), Common bile duct stone(s),                            Follow-up of bile duct stone(s), Stent change,                            Biliary stent removal Providers:                Justice Britain, MD, Baird Cancer, RN, Josie Dixon, RN, Laverda Sorenson, Technician, Sampson Si, CRNA Referring MD:             Pricilla Riffle. Fuller Plan, MD, Lorrene Reid Medicines:                General Anesthesia, Cipro 400 mg IV, Indomethacin                            100 mg PR given after patient was sedated for                            procedure, Glucagon mg IV Complications:            No immediate complications. Estimated Blood Loss:     Estimated blood loss was minimal. Procedure:                Pre-Anesthesia Assessment:                           - Prior to the procedure, a History and Physical                            was performed, and patient medications and                            allergies were reviewed. The patient's tolerance of                            previous anesthesia was also reviewed. The risks                            and benefits of the procedure and the sedation                            options and risks were discussed with the patient.  All questions were answered, and informed consent                            was obtained. Prior Anticoagulants: The patient has                            taken Xarelto (rivaroxaban), last dose was 2 days                            prior to procedure. ASA Grade Assessment: III - A                            patient with severe systemic disease. After                             reviewing the risks and benefits, the patient was                            deemed in satisfactory condition to undergo the                            procedure.                           After obtaining informed consent, the scope was                            passed under direct vision. Throughout the                            procedure, the patient's blood pressure, pulse, and                            oxygen saturations were monitored continuously. The                            [id number]-[id number] ([id number]) Olympus Duodensocope was                            introduced through the mouth, and used to inject                            contrast into and used to inject contrast into the                            bile duct. The ERCP was accomplished without                            difficulty. The patient tolerated the procedure. Scope In: Scope Out: Findings:      A scout film of the abdomen was obtained. Surgical clips, consistent       with previous cholecystectomy, were seen in the area of the right upper       quadrant of the abdomen. One plastic stent ending in  the main bile duct       was seen.      The upper GI tract was traversed under direct vision without detailed       examination. A hiatal hernia and J-shaped deformity were found in the       examined stomach. The major papilla was located entirely within a       pantaloon diverticulum. A biliary sphincterotomy had been performed. The       sphincterotomy appeared open. One plastic biliary stent originating in       the biliary tree was emerging from the major papilla. This stent was       removed from the biliary tree using a snare.      A short 0.035 inch Soft Jagwire was passed into the right biliary tree.       The Autotome sphincterotome was passed over the guidewire and the bile       duct was then deeply cannulated. Contrast was injected. I personally       interpreted the bile duct images. Ductal flow of contrast was  adequate.       Image quality was adequate. Contrast extended to the hepatic ducts.       Opacification of the entire biliary tree except for the cystic duct and       gallbladder was successful. The maximum diameter of the ducts was 14 mm       within the main duct. The left hepatic duct with dilated up to 10 mm       while the right system was not significant dilated beyond the       bifurcation. The lower third of the main bile duct, middle third of the       main bile duct and upper third of the main bile duct contained filling       defects thought to be remaining stone fragments and sludge. To discover       objects, the biliary tree was swept with a retrieval balloon. Sludge was       swept from the duct. Four larger stone fragments were removed (largest       was greater than 1 cm in size). No stones remained. Dilation of the       distal common bile duct with a 10-07-11 mm balloon (to a maximum balloon       size of 11 mm) dilator was successful as a sphincteroplasty to ensure       adequate passage of bile and sludge through the biliary orifice. I then       transitioned the wire into the left system to ensure all sludge/debris       was removed from that region. To discover objects, the biliary tree was       swept with a retrieval balloon (using both a 9-12 mm balloon and then a       12-15 mm balloon) moving up the duct sequentially and eventually to the       bifurcation. Nothing was found. An occlusion cholangiogram was performed       that showed no further significant biliary pathology. Sterile saline was       injected into the biliary tree to decrease risk of recurrent biliary       stone disease.      A pancreatogram was not performed.      The duodenoscope was withdrawn from the patient. Impression:               -  Hiatal hernia and J-shaped body deformity found.                           - The major papilla was located entirely within a                             pantaloon diverticulum.                           - Prior biliary sphincterotomy appeared open.                           - One stent from the biliary tree was seen in the                            major papilla. This was removed.                           - Filling defects consistent with stone fragments                            and sludge were seen on the cholangiogram.                           - Biliary dilation was noted within the main duct                            and the left hepatic system.                           - Choledocholithiasis and sludge was found.                            Complete removal was accomplished by                            sphincteroplasty and balloon sweeping.                           - At the endo of the procedure, the biliary tree                            was swept and nothing was found - sterile saline                            injected into the biliary tree to decrease risk of                            recurrent stone disease. Recommendation:           - The patient will be observed post-procedure,                            until all discharge criteria are met.                           -  Discharge patient to home.                           - Patient has a contact number available for                            emergencies. The signs and symptoms of potential                            delayed complications were discussed with the                            patient. Return to normal activities tomorrow.                            Written discharge instructions were provided to the                            patient.                           - Low fat diet for 1 week.                           - Observe patient's clinical course.                           - Watch for pancreatitis, bleeding, perforation,                            and cholangitis.                           - May restart anticoagulation in 48 hours (9/17 AM)                             in order to decrease risk of post-intervention                            bleeding.                           - Trend LFT pattern. Recheck LFTs in 2-3 weeks as                            outpatient - will update Dr. Fuller Plan.                           - As discussed, there remains a small risk in a                            chronically dilated bile duct for stone/sludge                            recurrence but hopefully with sphincteroplasty and  clean out today, this will not be an issue for our                            patient any further.                           - The findings and recommendations were discussed                            with the patient.                           - The findings and recommendations were discussed                            with the patient's family. Procedure Code(s):        --- Professional ---                           [phone], Endoscopic retrograde                            cholangiopancreatography (ERCP); with removal of                            foreign body(s) or stent(s) from biliary/pancreatic                            duct(s)                           43264, Endoscopic retrograde                            cholangiopancreatography (ERCP); with removal of                            calculi/debris from biliary/pancreatic duct(s) Diagnosis Code(s):        --- Professional ---                           K31.89, Other diseases of stomach and duodenum                           Z96.89, Presence of other specified functional                            implants                           K80.50, Calculus of bile duct without cholangitis                            or cholecystitis without obstruction                           Z46.59, Encounter for fitting and adjustment of  other gastrointestinal appliance and device                           R93.2, Abnormal findings on diagnostic  imaging of                            liver and biliary tract CPT copyright 2019 American Medical Association. All rights reserved. The codes documented in this report are preliminary and upon coder review may  be revised to meet current compliance requirements. Justice Britain, MD 09/10/2020 12:00:40 PM Number of Addenda: 0

## 2020-09-10 NOTE — H&P (Signed)
GASTROENTEROLOGY PROCEDURE H&P NOTE   Primary Care Physician: Mayer Masker, PA-C  HPI: Christina Ortiz is a 86 y.o. female who presents for ERCP for further stone extraction and possible stenting/cholangioscopy.  Past Medical History:  Diagnosis Date  . Acute bronchitis 10/2017  . Arthritis   . CKD (chronic kidney disease)    Xarelto dose is 15 mg QD  . Coronary artery disease    a. s/p PCI/stenting to RCA and LAD ([facility] in [place]) in 2006; b. Nuc 1/15: No ischemia, EF 70%  . Depression   . Gallstones   . Gastritis   . GERD (gastroesophageal reflux disease)   . Hiatal hernia   . Hip fracture (HCC)   . History of cholecystectomy   . History of echocardiogram    a. Echo 1/15: Moderate LVH, EF 55-60%, impaired relaxation, mild AS  . Hyperlipidemia   . Hypertension   . Hypokalemia   . Hypothyroidism   . Mobitz type 1 second degree atrioventricular block    a. Event Monitor 3/15: NSR, first-degree AV block, second-degree AB block - Mobitz 1, PACs, NSVT (5 beats)  . PAF (paroxysmal atrial fibrillation) (HCC)   . Thyroid disease    Past Surgical History:  Procedure Laterality Date  . ANGIOPLASTY  2006   with sent placement  . BACK SURGERY     Dr Shon Baton  . BILIARY DILATION  10/18/2019   Procedure: BILIARY DILATION;  Surgeon: Rachael Fee, MD;  Location: [facility];  Service: Endoscopy;;  . BILIARY DILATION  12/17/2019   Procedure: BILIARY DILATION;  Surgeon: [name], MD;  Location: [facility] ENDOSCOPY;  Service: Endoscopy;;  . BILIARY DILATION  02/07/2020   Procedure: BILIARY DILATION;  Surgeon: Lemar Lofty., MD;  Location: [facility] ENDOSCOPY;  Service: Gastroenterology;;  . BILIARY STENT PLACEMENT N/A 10/18/2019   Procedure: BILIARY STENT PLACEMENT;  Surgeon: Rachael Fee, MD;  Location: [facility] ENDOSCOPY;  Service: Endoscopy;  Laterality: N/A;  . BILIARY STENT PLACEMENT N/A 12/17/2019   Procedure: BILIARY STENT PLACEMENT;  Surgeon: [name], MD;  Location: [facility] ENDOSCOPY;  Service: Endoscopy;  Laterality: N/A;  . BILIARY STENT PLACEMENT  02/07/2020   Procedure: BILIARY STENT PLACEMENT;  Surgeon: Meridee Score Netty Starring., MD;  Location: [facility] ENDOSCOPY;  Service: Gastroenterology;;  . BLEPHAROPLASTY    . BREAST LUMPECTOMY Left   . CARDIAC CATHETERIZATION  2006  . CATARACT EXTRACTION Bilateral   . CHOLECYSTECTOMY    . DILATION AND CURETTAGE OF UTERUS    . ENDOSCOPIC RETROGRADE CHOLANGIOPANCREATOGRAPHY (ERCP) WITH PROPOFOL N/A 12/17/2019   Procedure: ENDOSCOPIC RETROGRADE CHOLANGIOPANCREATOGRAPHY (ERCP) WITH PROPOFOL;  Surgeon: [name], MD;  Location: [facility] ENDOSCOPY;  Service: Endoscopy;  Laterality: N/A;  . ENDOSCOPIC RETROGRADE CHOLANGIOPANCREATOGRAPHY (ERCP) WITH PROPOFOL N/A 02/07/2020   Procedure: ENDOSCOPIC RETROGRADE CHOLANGIOPANCREATOGRAPHY (ERCP) WITH PROPOFOL;  Surgeon: Meridee Score Netty Starring., MD;  Location: [facility] ENDOSCOPY;  Service: Gastroenterology;  Laterality: N/A;  . ERCP N/A 10/18/2019   Procedure: ENDOSCOPIC RETROGRADE CHOLANGIOPANCREATOGRAPHY (ERCP);  Surgeon: Rachael Fee, MD;  Location: [facility] ENDOSCOPY;  Service: Endoscopy;  Laterality: N/A;  . ESOPHAGOGASTRODUODENOSCOPY (EGD) WITH PROPOFOL N/A 12/17/2019   Procedure: ESOPHAGOGASTRODUODENOSCOPY (EGD) WITH PROPOFOL;  Surgeon: [name], MD;  Location: [facility] ENDOSCOPY;  Service: Endoscopy;  Laterality: N/A;  . KYPHOPLASTY N/A 01/03/2020   Procedure: KYPHOPLASTY LUMBAR TWO;  Surgeon: Venita Lick, MD;  Location: [facility] OR;  Service: Orthopedics;  Laterality: N/A;  . REMOVAL OF STONES  10/18/2019   Procedure: REMOVAL OF STONES;  Surgeon:  Rachael Fee, MD;  Location: [facility]OSCOPY;  Service: Endoscopy;;  . REMOVAL OF STONES  12/17/2019   Procedure: REMOVAL OF STONES;  Surgeon: [name], MD;  Location: [facility] ENDOSCOPY;  Service: Endoscopy;;  . REMOVAL OF STONES  02/07/2020   Procedure: REMOVAL OF STONES;  Surgeon: Lemar Lofty., MD;  Location: [facility]  ENDOSCOPY;  Service: Gastroenterology;;  . Gaspar Bidding DILATION N/A 12/17/2019   Procedure: Gaspar Bidding DILATION;  Surgeon: [name], MD;  Location: [facility] ENDOSCOPY;  Service: Endoscopy;  Laterality: N/A;  . SPHINCTEROTOMY  10/18/2019   Procedure: SPHINCTEROTOMY;  Surgeon: Rachael Fee, MD;  Location: [facility] ENDOSCOPY;  Service: Endoscopy;;  . Dennison Mascot  12/17/2019   Procedure: Dennison Mascot;  Surgeon: [name], MD;  Location: [facility] ENDOSCOPY;  Service: Endoscopy;;  . Burman Freestone CHOLANGIOSCOPY N/A 02/07/2020   Procedure: [id number] CHOLANGIOSCOPY;  Surgeon: Lemar Lofty., MD;  Location: [facility] ENDOSCOPY;  Service: Gastroenterology;  Laterality: N/A;  Burman Freestone LITHOTRIPSY N/A 02/07/2020   Procedure: [id number] LITHOTRIPSY;  Surgeon: Lemar Lofty., MD;  Location: [facility] ENDOSCOPY;  Service: Gastroenterology;  Laterality: N/A;  . STENT REMOVAL  12/17/2019   Procedure: STENT REMOVAL;  Surgeon: [name], MD;  Location: [facility] ENDOSCOPY;  Service: Endoscopy;;  . STENT REMOVAL  02/07/2020   Procedure: STENT REMOVAL;  Surgeon: Lemar Lofty., MD;  Location: [facility] ENDOSCOPY;  Service: Gastroenterology;;   Current Facility-Administered Medications  Medication Dose Route Frequency Provider Last Rate Last Admin  . 0.9 %  sodium chloride infusion   Intravenous Continuous Mansouraty, Netty Starring., MD       Facility-Administered Medications Ordered in Other Encounters  Medication Dose Route Frequency Provider Last Rate Last Admin  . lactated ringers infusion   Intravenous Continuous PRN De Nurse, CRNA   New Bag at 09/10/20 1025   No Active Allergies Family History  Problem Relation Age of Onset  . Heart disease Mother   . Heart attack Mother   . Lung cancer Father   . Lymphoma Brother   . Heart failure Brother   . Esophageal cancer Paternal Uncle   . Colon cancer Neg Hx   . Colon polyps Neg Hx   . Rectal cancer Neg Hx   . Stomach cancer Neg Hx    Social History    Socioeconomic History  . Marital status: Widowed    Spouse name: Not on file  . Number of children: 1  . Years of education: Not on file  . Highest education level: Not on file  Occupational History  . Occupation: retired  Tobacco Use  . Smoking status: Former Smoker    Packs/day: 0.50    Years: 67.00    Pack years: 33.50    Types: Cigarettes    Quit date: 10/24/2014    Years since quitting: 5.8  . Smokeless tobacco: Never Used  . Tobacco comment: quit 2015  Vaping Use  . Vaping Use: Never used  Substance and Sexual Activity  . Alcohol use: Yes    Alcohol/week: 4.0 standard drinks    Types: 4 Shots of liquor per week    Comment: vodka  . Drug use: No  . Sexual activity: Never    Birth control/protection: Post-menopausal  Other Topics Concern  . Not on file  Social History Narrative  . Not on file   Social Determinants of Health   Financial Resource Strain:   . Difficulty of Paying Living Expenses: Not on file  Food Insecurity:   . Worried About Running  Out of Food in the Last Year: Not on file  . Ran Out of Food in the Last Year: Not on file  Transportation Needs:   . Lack of Transportation (Medical): Not on file  . Lack of Transportation (Non-Medical): Not on file  Physical Activity:   . Days of Exercise per Week: Not on file  . Minutes of Exercise per Session: Not on file  Stress:   . Feeling of Stress : Not on file  Social Connections:   . Frequency of Communication with Friends and Family: Not on file  . Frequency of Social Gatherings with Friends and Family: Not on file  . Attends Religious Services: Not on file  . Active Member of Clubs or Organizations: Not on file  . Attends Banker Meetings: Not on file  . Marital Status: Not on file  Intimate Partner Violence:   . Fear of Current or Ex-Partner: Not on file  . Emotionally Abused: Not on file  . Physically Abused: Not on file  . Sexually Abused: Not on file    Physical  Exam: Vital signs in last 24 hours: Temp:  [98.7 F (37.1 C)] 98.7 F (37.1 C) (09/15 1008) Pulse Rate:  [63] 63 (09/15 1008) Resp:  [13] 13 (09/15 1008) BP: (152)/(85) 152/85 (09/15 1008) SpO2:  [95 %] 95 % (09/15 1008) Weight:  [61.2 kg] 61.2 kg (09/15 1008)   GEN: NAD EYE: Sclerae anicteric ENT: MMM CV: Non-tachycardic GI: Soft, NT/ND NEURO:  Alert & Oriented x 3  Lab Results: No results for input(s): WBC, HGB, HCT, PLT in the last 72 hours. BMET No results for input(s): NA, K, CL, CO2, GLUCOSE, BUN, CREATININE, CALCIUM in the last 72 hours. LFT No results for input(s): PROT, ALBUMIN, AST, ALT, ALKPHOS, BILITOT, BILIDIR, IBILI in the last 72 hours. PT/INR No results for input(s): LABPROT, INR in the last 72 hours.   Impression / Plan: This is a 86 y.o.female  who presents for ERCP for further stone extraction and possible stenting/cholangioscopy.  The risks of an ERCP were discussed at length, including but not limited to the risk of perforation, bleeding, abdominal pain, post-ERCP pancreatitis (while usually mild can be severe and even life threatening).  The risks and benefits of endoscopic evaluation were discussed with the patient; these include but are not limited to the risk of perforation, infection, bleeding, missed lesions, lack of diagnosis, severe illness requiring hospitalization, as well as anesthesia and sedation related illnesses.  The patient is agreeable to proceed.    Corliss Parish, MD [facility] Office # [phone]

## 2020-09-10 NOTE — Anesthesia Preprocedure Evaluation (Addendum)
Anesthesia Evaluation  Patient identified by MRN, date of birth, ID band Patient awake    Reviewed: Allergy & Precautions, NPO status , Patient's Chart, lab work & pertinent test results, reviewed documented beta blocker date and time   Airway Mallampati: II  TM Distance: >3 FB Neck ROM: Full    Dental  (+) Loose,    Pulmonary shortness of breath and with exertion, former smoker,  Hoarseness of voice   Pulmonary exam normal breath sounds clear to auscultation       Cardiovascular hypertension, Pt. on medications and Pt. on home beta blockers + CAD and + Cardiac Stents  Normal cardiovascular exam+ dysrhythmias Atrial Fibrillation  Rhythm:Regular Rate:Normal  Cardiac stents 2006   Neuro/Psych PSYCHIATRIC DISORDERS Depression  Neuromuscular disease    GI/Hepatic hiatal hernia, GERD  Medicated and Controlled,(+)     substance abuse  alcohol use, CBD obstruction Choledocholithiasis   Endo/Other  Hypothyroidism Hyperlipidemia  Renal/GU Renal InsufficiencyRenal disease  negative genitourinary   Musculoskeletal  (+) Arthritis ,   Abdominal   Peds  Hematology Chronic anticoagulation-xarelto therapy last dose 2 days ago   Anesthesia Other Findings   Reproductive/Obstetrics                            Anesthesia Physical Anesthesia Plan  ASA: III  Anesthesia Plan: General   Post-op Pain Management:    Induction: Intravenous  PONV Risk Score and Plan: 3 and Treatment may vary due to age or medical condition and Ondansetron  Airway Management Planned: Oral ETT  Additional Equipment:   Intra-op Plan:   Post-operative Plan: Extubation in OR  Informed Consent: I have reviewed the patients History and Physical, chart, labs and discussed the procedure including the risks, benefits and alternatives for the proposed anesthesia with the patient or authorized representative who has indicated  his/her understanding and acceptance.     Dental advisory given  Plan Discussed with: CRNA and Anesthesiologist  Anesthesia Plan Comments:         Anesthesia Quick Evaluation

## 2020-09-11 ENCOUNTER — Encounter (HOSPITAL_COMMUNITY): Payer: Self-pay | Admitting: Gastroenterology

## 2020-09-17 ENCOUNTER — Other Ambulatory Visit: Payer: Self-pay

## 2020-09-17 ENCOUNTER — Telehealth: Payer: Medicare PPO | Admitting: Physician Assistant

## 2020-09-24 DIAGNOSIS — Z1231 Encounter for screening mammogram for malignant neoplasm of breast: Secondary | ICD-10-CM | POA: Diagnosis not present

## 2020-09-24 LAB — HM MAMMOGRAPHY

## 2020-09-26 ENCOUNTER — Other Ambulatory Visit: Payer: Self-pay | Admitting: Adult Health

## 2020-09-29 ENCOUNTER — Other Ambulatory Visit: Payer: Self-pay | Admitting: Physician Assistant

## 2020-10-08 ENCOUNTER — Encounter: Payer: Self-pay | Admitting: Physician Assistant

## 2020-11-06 ENCOUNTER — Other Ambulatory Visit: Payer: Self-pay | Admitting: Cardiology

## 2020-11-11 ENCOUNTER — Other Ambulatory Visit: Payer: Self-pay | Admitting: Physician Assistant

## 2020-11-11 DIAGNOSIS — E039 Hypothyroidism, unspecified: Secondary | ICD-10-CM

## 2020-11-14 ENCOUNTER — Other Ambulatory Visit: Payer: Self-pay

## 2020-11-14 ENCOUNTER — Other Ambulatory Visit: Payer: Medicare PPO

## 2020-11-14 DIAGNOSIS — E039 Hypothyroidism, unspecified: Secondary | ICD-10-CM

## 2020-11-15 LAB — T3: T3, Total: 80 ng/dL (ref 71–180)

## 2020-11-15 LAB — T4, FREE: Free T4: 1.35 ng/dL (ref 0.82–1.77)

## 2020-11-15 LAB — TSH: TSH: 1.98 u[IU]/mL (ref 0.450–4.500)

## 2020-11-17 ENCOUNTER — Encounter: Payer: Self-pay | Admitting: Physician Assistant

## 2020-11-17 NOTE — Telephone Encounter (Signed)
Spoke to patients daughter who states that patient is having a lot of confusion and refuses to go to Neurologist. Christina Ortiz is wondering if we need to make medication adjustments. Please advise. AS, CMA

## 2020-11-17 NOTE — Telephone Encounter (Signed)
Spoke with Kandis Cocking who states patient will need an appointment to discuss concerns and address possible medication change.    Called and left message for [name] to call back. AS, CMA

## 2020-11-17 NOTE — Telephone Encounter (Signed)
Patient is scheduled for 11/19/20 at 11am. AS, CMA

## 2020-11-18 ENCOUNTER — Telehealth: Payer: Self-pay | Admitting: Physician Assistant

## 2020-11-18 NOTE — Telephone Encounter (Signed)
Patient's daughter called to cancel Mother's appt for tomorrow due to mother refusing to go. She wanted me to let you know.

## 2020-11-18 NOTE — Telephone Encounter (Signed)
Christina Ortiz is aware. AS, CMA 

## 2020-11-19 ENCOUNTER — Ambulatory Visit: Payer: Medicare PPO | Admitting: Physician Assistant

## 2020-11-29 ENCOUNTER — Other Ambulatory Visit: Payer: Self-pay | Admitting: Physician Assistant

## 2020-12-01 ENCOUNTER — Telehealth: Payer: Self-pay | Admitting: Physician Assistant

## 2020-12-01 MED ORDER — FOLIC ACID 1 MG PO TABS
1.0000 mg | ORAL_TABLET | Freq: Every day | ORAL | 0 refills | Status: DC
Start: 2020-12-01 — End: 2021-01-07

## 2020-12-01 MED ORDER — LEVOTHYROXINE SODIUM 75 MCG PO TABS: 75.0000 ug | ORAL_TABLET | Freq: Every day | ORAL | 0 refills | Status: DC

## 2020-12-01 NOTE — Telephone Encounter (Signed)
Patient needs refill on below medication.  Patient had labs completed on 11/14/20.  Please sent to [facility].  folic acid (FOLVITE) 1 MG tablet [[id number]]   Order Details Dose: 1 mg Route: Oral Frequency: Daily  Dispense Quantity: 60 tablet Refills: 0       Sig: Take 1 tablet (1 mg total) by mouth daily. **NEEDS APT FOR FURTHER REFILLS**      Start Date: 09/29/20 End Date: --  Written Date: 09/29/20 Expiration Date: 09/29/21  Original Order:  folic acid (FOLVITE) 1 MG tablet [[id number]   levothyroxine (SYNTHROID) 75 MCG tablet [[id number]]   Order Details Dose: 75 mcg Route: Oral Frequency: Daily before breakfast  Dispense Quantity: 60 tablet Refills: 0       Sig: Take 1 tablet (75 mcg total) by mouth daily before breakfast. **PATIENT NEEDS LAB FOR FURTHER REFILLS**      Start Date: 11/11/20 End Date: --  Written Date: 11/11/20 Expiration Date: 11/11/21  Original Order:  levothyroxine (SYNTHROID) 75 MCG tablet [[id number]]

## 2020-12-01 NOTE — Telephone Encounter (Signed)
Per last OV 07/11/20 Maritza advised patient to follow up in 8 weeks. Sending in 30 day supply per refill protocol.   Please call patient to schedule OV apt for further medication refills. AS, CMA

## 2020-12-02 DIAGNOSIS — M5459 Other low back pain: Secondary | ICD-10-CM | POA: Diagnosis not present

## 2020-12-02 NOTE — Telephone Encounter (Signed)
Patient is scheduled for 1/6 

## 2020-12-09 ENCOUNTER — Other Ambulatory Visit: Payer: Self-pay | Admitting: Cardiology

## 2020-12-10 MED ORDER — POTASSIUM CHLORIDE CRYS ER 20 MEQ PO TBCR: 20.0000 meq | EXTENDED_RELEASE_TABLET | Freq: Every day | ORAL | 0 refills | Status: DC

## 2020-12-16 ENCOUNTER — Telehealth: Payer: Self-pay | Admitting: Cardiology

## 2020-12-16 NOTE — Telephone Encounter (Signed)
° °  [facility] Pre-operative Risk Assessment    Request for surgical clearance:  1. What type of surgery is being performed? Lumbar ESI   2. When is this surgery scheduled? 12/25/20   3. What type of clearance is required (medical clearance vs. Pharmacy clearance to hold med vs. Both)? Medical   4. Are there any medications that need to be held prior to surgery and how long? Xarelto - 3 days   5. Practice name and name of physician performing surgery? [facility]  6. What is the office phone number? [phone]   7.   What is the office fax number? [place]  8.   Anesthesia type (None, local, MAC, general) ? none   Sheral Apley M 12/16/2020, 9:17 AM  _________________________________________________________________   (provider comments below)

## 2020-12-16 NOTE — Telephone Encounter (Signed)
Patient with diagnosis of afib on Xarelto for anticoagulation.    Procedure: Lumbar ESI  Date of procedure: 12/25/2020    CHA2DS2-VASc Score = 5  This indicates a 7.2% annual risk of stroke. The patient's score is based upon: CHF History: No HTN History: Yes Diabetes History: No Stroke History: No Vascular Disease History: Yes Age Score: 2 Gender Score: 1      CrCl 23 ml/min  Per office protocol, patient can hold Xarelto for 4 days prior to procedure.

## 2020-12-16 NOTE — Telephone Encounter (Signed)
   Primary Cardiologist: Donato Schultz, MD  Chart reviewed as part of pre-operative protocol coverage. Given past medical history and time since last visit, based on ACC/AHA guidelines, Aleila Shreffler would be at acceptable risk for the planned procedure without further cardiovascular testing.   Patient was advised that if she develops new symptoms prior to surgery to contact our office to arrange a follow-up appointment.  She verbalized understanding.  Patient with diagnosis of afib on Xarelto for anticoagulation.    Procedure: Lumbar ESI Date of procedure: 12/25/2020    CHA2DS2-VASc Score = 5  This indicates a 7.2% annual risk of stroke. The patient's score is based upon: CHF History: No HTN History: Yes Diabetes History: No Stroke History: No Vascular Disease History: Yes Age Score: 2 Gender Score: 1      CrCl 23 ml/min  Per office protocol, patient can hold Xarelto for 4 days prior to procedure.  I will route this recommendation to the requesting party via Epic fax function and remove from pre-op pool.  Please call with questions.  Thomasene Ripple. [place] NP-C    12/16/2020, 12:29 PM [facility] [address] Office [phone] Fax [phone]

## 2020-12-17 ENCOUNTER — Other Ambulatory Visit: Payer: Self-pay | Admitting: Cardiology

## 2020-12-22 ENCOUNTER — Other Ambulatory Visit: Payer: Self-pay | Admitting: Cardiology

## 2020-12-22 NOTE — Telephone Encounter (Signed)
Prescription refill request for Xarelto received.  Indication: afib Last office visit: [place], 08/14/2019 Weight: 61.2 kg Age: 86 yo Scr: 1.32, 07/23/2020 CrCl: 84ml/min    Pt scheduled to see Dr Anne Fu on 01/07/2020, prescription refill sent.

## 2020-12-25 DIAGNOSIS — M5416 Radiculopathy, lumbar region: Secondary | ICD-10-CM | POA: Diagnosis not present

## 2021-01-01 ENCOUNTER — Ambulatory Visit (INDEPENDENT_AMBULATORY_CARE_PROVIDER_SITE_OTHER): Payer: Medicare PPO | Admitting: Physician Assistant

## 2021-01-01 ENCOUNTER — Other Ambulatory Visit: Payer: Self-pay

## 2021-01-01 ENCOUNTER — Encounter: Payer: Self-pay | Admitting: Physician Assistant

## 2021-01-01 VITALS — BP 124/83 | HR 72 | Ht 60.0 in | Wt 126.9 lb

## 2021-01-01 DIAGNOSIS — F339 Major depressive disorder, recurrent, unspecified: Secondary | ICD-10-CM

## 2021-01-01 MED ORDER — PAROXETINE HCL 20 MG PO TABS: ORAL_TABLET | ORAL | 0 refills | Status: DC

## 2021-01-01 NOTE — Progress Notes (Signed)
Established Patient Office Visit  Subjective:  Patient ID: Christina Ortiz, female    DOB: 03-30-1929  Age: 87 y.o. MRN: [id number]  CC:  Chief Complaint  Patient presents with  . Depression    HPI Christina Ortiz presents for follow-up on mood management. Patient is accompanied by her daughter. Has been taking Wellbutrin but would like to resume Paxil.  Patient's daughter reports patient has experienced some "delusions" and unsure if related to medication. Patient has mentioned seeing her husband who has been deceased for a couple of years.  Patient has tolerated Paxil without issues.  Denies SI/HI.  Past Medical History:  Diagnosis Date  . Acute bronchitis 10/2017  . Arthritis   . CKD (chronic kidney disease)    Xarelto dose is 15 mg QD  . Coronary artery disease    a. s/p PCI/stenting to RCA and LAD ([facility] in [place]) in 2006; b. Nuc 1/15: No ischemia, EF 70%  . Depression   . Gallstones   . Gastritis   . GERD (gastroesophageal reflux disease)   . Hiatal hernia   . Hip fracture (HCC)   . History of cholecystectomy   . History of echocardiogram    a. Echo 1/15: Moderate LVH, EF 55-60%, impaired relaxation, mild AS  . Hyperlipidemia   . Hypertension   . Hypokalemia   . Hypothyroidism   . Mobitz type 1 second degree atrioventricular block    a. Event Monitor 3/15: NSR, first-degree AV block, second-degree AB block - Mobitz 1, PACs, NSVT (5 beats)  . PAF (paroxysmal atrial fibrillation) (HCC)   . Thyroid disease     Past Surgical History:  Procedure Laterality Date  . ANGIOPLASTY  2006   with sent placement  . BACK SURGERY     Dr Shon Baton  . BILIARY DILATION  10/18/2019   Procedure: BILIARY DILATION;  Surgeon: Rachael Fee, MD;  Location: [facility];  Service: Endoscopy;;  . BILIARY DILATION  12/17/2019   Procedure: BILIARY DILATION;  Surgeon: [name], MD;  Location: [facility] ENDOSCOPY;  Service: Endoscopy;;  . BILIARY DILATION  02/07/2020    Procedure: BILIARY DILATION;  Surgeon: Lemar Lofty., MD;  Location: [facility] ENDOSCOPY;  Service: Gastroenterology;;  . BILIARY DILATION  09/10/2020   Procedure: BILIARY DILATION;  Surgeon: Lemar Lofty., MD;  Location: [facility] ENDOSCOPY;  Service: Gastroenterology;;  . BILIARY STENT PLACEMENT N/A 10/18/2019   Procedure: BILIARY STENT PLACEMENT;  Surgeon: Rachael Fee, MD;  Location: [facility] ENDOSCOPY;  Service: Endoscopy;  Laterality: N/A;  . BILIARY STENT PLACEMENT N/A 12/17/2019   Procedure: BILIARY STENT PLACEMENT;  Surgeon: [name], MD;  Location: [facility] ENDOSCOPY;  Service: Endoscopy;  Laterality: N/A;  . BILIARY STENT PLACEMENT  02/07/2020   Procedure: BILIARY STENT PLACEMENT;  Surgeon: Meridee Score Netty Starring., MD;  Location: [facility] ENDOSCOPY;  Service: Gastroenterology;;  . BLEPHAROPLASTY    . BREAST LUMPECTOMY Left   . CARDIAC CATHETERIZATION  2006  . CATARACT EXTRACTION Bilateral   . CHOLECYSTECTOMY    . DILATION AND CURETTAGE OF UTERUS    . ENDOSCOPIC RETROGRADE CHOLANGIOPANCREATOGRAPHY (ERCP) WITH PROPOFOL N/A 12/17/2019   Procedure: ENDOSCOPIC RETROGRADE CHOLANGIOPANCREATOGRAPHY (ERCP) WITH PROPOFOL;  Surgeon: [name], MD;  Location: [facility] ENDOSCOPY;  Service: Endoscopy;  Laterality: N/A;  . ENDOSCOPIC RETROGRADE CHOLANGIOPANCREATOGRAPHY (ERCP) WITH PROPOFOL N/A 02/07/2020   Procedure: ENDOSCOPIC RETROGRADE CHOLANGIOPANCREATOGRAPHY (ERCP) WITH PROPOFOL;  Surgeon: Meridee Score Netty Starring., MD;  Location: [facility] ENDOSCOPY;  Service: Gastroenterology;  Laterality: N/A;  . ENDOSCOPIC RETROGRADE  CHOLANGIOPANCREATOGRAPHY (ERCP) WITH PROPOFOL N/A 09/10/2020   Procedure: ENDOSCOPIC RETROGRADE CHOLANGIOPANCREATOGRAPHY (ERCP) WITH PROPOFOL;  Surgeon: Meridee Score Netty Starring., MD;  Location: [facility] ENDOSCOPY;  Service: Gastroenterology;  Laterality: N/A;  . ERCP N/A 10/18/2019   Procedure: ENDOSCOPIC RETROGRADE CHOLANGIOPANCREATOGRAPHY (ERCP);  Surgeon: Rachael Fee, MD;  Location: [facility]  ENDOSCOPY;  Service: Endoscopy;  Laterality: N/A;  . ESOPHAGOGASTRODUODENOSCOPY (EGD) WITH PROPOFOL N/A 12/17/2019   Procedure: ESOPHAGOGASTRODUODENOSCOPY (EGD) WITH PROPOFOL;  Surgeon: [name], MD;  Location: [facility] ENDOSCOPY;  Service: Endoscopy;  Laterality: N/A;  . KYPHOPLASTY N/A 01/03/2020   Procedure: KYPHOPLASTY LUMBAR TWO;  Surgeon: Venita Lick, MD;  Location: [facility] OR;  Service: Orthopedics;  Laterality: N/A;  . REMOVAL OF STONES  10/18/2019   Procedure: REMOVAL OF STONES;  Surgeon: Rachael Fee, MD;  Location: [facility] ENDOSCOPY;  Service: Endoscopy;;  . REMOVAL OF STONES  12/17/2019   Procedure: REMOVAL OF STONES;  Surgeon: [name], MD;  Location: [facility] ENDOSCOPY;  Service: Endoscopy;;  . REMOVAL OF STONES  02/07/2020   Procedure: REMOVAL OF STONES;  Surgeon: Lemar Lofty., MD;  Location: [facility] ENDOSCOPY;  Service: Gastroenterology;;  . REMOVAL OF STONES  09/10/2020   Procedure: REMOVAL OF STONES;  Surgeon: Lemar Lofty., MD;  Location: [facility] ENDOSCOPY;  Service: Gastroenterology;;  . Gaspar Bidding DILATION N/A 12/17/2019   Procedure: Gaspar Bidding DILATION;  Surgeon: [name], MD;  Location: [facility] ENDOSCOPY;  Service: Endoscopy;  Laterality: N/A;  . SPHINCTEROTOMY  10/18/2019   Procedure: SPHINCTEROTOMY;  Surgeon: Rachael Fee, MD;  Location: [facility] ENDOSCOPY;  Service: Endoscopy;;  . Dennison Mascot  12/17/2019   Procedure: Dennison Mascot;  Surgeon: [name], MD;  Location: [facility] ENDOSCOPY;  Service: Endoscopy;;  . Burman Freestone CHOLANGIOSCOPY N/A 02/07/2020   Procedure: BJSEGBTD CHOLANGIOSCOPY;  Surgeon: Lemar Lofty., MD;  Location: [facility] ENDOSCOPY;  Service: Gastroenterology;  Laterality: N/A;  Burman Freestone LITHOTRIPSY N/A 02/07/2020   Procedure: VVOHYWVP LITHOTRIPSY;  Surgeon: Lemar Lofty., MD;  Location: [facility] ENDOSCOPY;  Service: Gastroenterology;  Laterality: N/A;  . STENT REMOVAL  12/17/2019   Procedure: STENT REMOVAL;  Surgeon: [name], MD;   Location: [facility] ENDOSCOPY;  Service: Endoscopy;;  . Francine Graven REMOVAL  02/07/2020   Procedure: STENT REMOVAL;  Surgeon: Lemar Lofty., MD;  Location: [facility] ENDOSCOPY;  Service: Gastroenterology;;  . Francine Graven REMOVAL  09/10/2020   Procedure: STENT REMOVAL;  Surgeon: Lemar Lofty., MD;  Location: [facility] ENDOSCOPY;  Service: Gastroenterology;;    Family History  Problem Relation Age of Onset  . Heart disease Mother   . Heart attack Mother   . Lung cancer Father   . Lymphoma Brother   . Heart failure Brother   . Esophageal cancer Paternal Uncle   . Colon cancer Neg Hx   . Colon polyps Neg Hx   . Rectal cancer Neg Hx   . Stomach cancer Neg Hx     Social History   Socioeconomic History  . Marital status: Widowed    Spouse name: Not on file  . Number of children: 1  . Years of education: Not on file  . Highest education level: Not on file  Occupational History  . Occupation: retired  Tobacco Use  . Smoking status: Former Smoker    Packs/day: 0.50    Years: 67.00    Pack years: 33.50    Types: Cigarettes    Quit date: 10/24/2014    Years since quitting: 6.1  . Smokeless tobacco: Never Used  . Tobacco comment: quit 2015  Vaping Use  . Vaping Use: Never used  Substance and Sexual Activity  . Alcohol use: Yes    Alcohol/week: 4.0 standard drinks    Types: 4 Shots of liquor per week    Comment: vodka  . Drug use: No  . Sexual activity: Never    Birth control/protection: Post-menopausal  Other Topics Concern  . Not on file  Social History Narrative  . Not on file   Social Determinants of Health   Financial Resource Strain: Not on file  Food Insecurity: Not on file  Transportation Needs: Not on file  Physical Activity: Not on file  Stress: Not on file  Social Connections: Not on file  Intimate Partner Violence: Not on file    Outpatient Medications Prior to Visit  Medication Sig Dispense Refill  . acetaminophen (TYLENOL) 325 MG tablet Take 650 mg by mouth every  6 (six) hours as needed for moderate pain (for pain.).     Marland Kitchen amLODipine (NORVASC) 5 MG tablet TAKE 1 TABLET BY MOUTH DAILY. (Patient taking differently: Take 5 mg by mouth daily.) 90 tablet 1  . antiseptic oral rinse (BIOTENE) LIQD 15 mLs by Mouth Rinse route daily.    Marland Kitchen buPROPion (WELLBUTRIN XL) 150 MG 24 hr tablet Take 1 tablet (150 mg total) by mouth daily. 90 tablet 1  . CALCIUM-MAGNESIUM-ZINC PO Take 1 tablet by mouth daily.     . fesoterodine (TOVIAZ) 4 MG TB24 tablet Take 4 mg by mouth daily.    . folic acid (FOLVITE) 1 MG tablet Take 1 tablet (1 mg total) by mouth daily. **NEEDS APT FOR FURTHER REFILLS** 30 tablet 0  . levothyroxine (SYNTHROID) 75 MCG tablet Take 1 tablet (75 mcg total) by mouth daily before breakfast. **PATIENT NEEDS LAB FOR FURTHER REFILLS** 30 tablet 0  . lidocaine (LIDODERM) 5 % Place 1 patch onto the skin daily. Remove & Discard patch within 12 hours or as directed by MD    . metoprolol tartrate (LOPRESSOR) 25 MG tablet Take 0.5 tablets (12.5 mg total) by mouth 2 (two) times daily. **NEEDS APT FOR FURTHER MED REFILLS** 60 tablet 0  . omeprazole (PRILOSEC) 20 MG capsule TAKE 1 CAPSULE BY MOUTH DAILY AS NEEDED FOR HEARTBURN. (Patient taking differently: Take 20 mg by mouth daily as needed (Reflux).) 90 capsule 3  . potassium chloride SA (KLOR-CON) 20 MEQ tablet Take 1 tablet (20 mEq total) by mouth daily. Please keep upcoming schedule for future refill 30 tablet 0  . Propylene Glycol 0.6 % SOLN Place 1 drop into both eyes daily.    . quinapril (ACCUPRIL) 40 MG tablet Take 1 tablet (40 mg total) by mouth daily. 90 tablet 1  . simvastatin (ZOCOR) 5 MG tablet TAKE 1 TABLET BY MOUTH EVERY EVENING. (Patient taking differently: Take 5 mg by mouth every evening.) 90 tablet 0  . spironolactone (ALDACTONE) 25 MG tablet Take 0.5 tablets (12.5 mg total) by mouth daily. 45 tablet 3  . VITAMIN D PO Take 1 tablet by mouth at bedtime.    Alveda Reasons 15 MG TABS tablet TAKE 1 TABLET BY  MOUTH DAILY WITH SUPPER. 90 tablet 0   No facility-administered medications prior to visit.    No Active Allergies  ROS Review of Systems A fourteen system review of systems was performed and found to be positive as per HPI.  Objective:    Physical Exam General:  Well Developed, in no acute distress, appropriate for stated age.  Neuro:  Alert and oriented,  extra-ocular  muscles intact  HEENT:  Normocephalic, atraumatic, neck supple Skin:  no gross rash, warm, pink. Cardiac:  RRR, S1 S2 Respiratory:  ECTA B/L w/o wheezing, Not using accessory muscles, speaking in full sentences- unlabored. Vascular:  Ext warm, no cyanosis apprec. Psych:  No HI/SI, judgement and insight good, Euthymic mood. Full Affect.   BP 124/83   Pulse 72   Ht 5' (1.524 m)   Wt 126 lb 14.4 oz (57.6 kg)   LMP  (LMP Unknown)   SpO2 91%   BMI 24.78 kg/m  Wt Readings from Last 3 Encounters:  01/01/21 126 lb 14.4 oz (57.6 kg)  09/10/20 135 lb (61.2 kg)  07/11/20 130 lb 4.8 oz (59.1 kg)     Health Maintenance Due  Topic Date Due  . COVID-19 Vaccine (3 - Booster for Pfizer series) 08/08/2020    There are no preventive care reminders to display for this patient.  Lab Results  Component Value Date   TSH 1.980 11/14/2020   Lab Results  Component Value Date   WBC 6.9 07/23/2020   HGB 14.6 07/23/2020   HCT 44.4 07/23/2020   MCV 101.4 (H) 07/23/2020   PLT 224 07/23/2020   Lab Results  Component Value Date   NA 136 07/23/2020   K 4.2 07/23/2020   CO2 22 07/23/2020   GLUCOSE 89 07/23/2020   BUN 24 (H) 07/23/2020   CREATININE 1.32 (H) 07/23/2020   BILITOT 0.8 07/23/2020   ALKPHOS 105 07/23/2020   AST 20 07/23/2020   ALT 8 07/23/2020   PROT 8.0 07/23/2020   ALBUMIN 4.1 07/23/2020   CALCIUM 9.4 07/23/2020   ANIONGAP 13 07/23/2020   GFR 47.84 (L) 02/15/2020   Lab Results  Component Value Date   CHOL 146 07/05/2019   Lab Results  Component Value Date   HDL 57 07/05/2019   Lab Results   Component Value Date   LDLCALC 75 07/05/2019   Lab Results  Component Value Date   TRIG 71 07/05/2019   Lab Results  Component Value Date   CHOLHDL 2.6 07/05/2019   Lab Results  Component Value Date   HGBA1C 5.5 09/27/2019      Assessment & Plan:   Problem List Items Addressed This Visit   None   Visit Diagnoses    Depression, recurrent (HCC)    -  Primary   Relevant Medications   PARoxetine (PAXIL) 20 MG tablet     Depression, recurrent: -PHQ-9 score of 7.  -Delusions can be a possible side effect of Wellbutrin and patient has tolerated Paxil 40 mg in the past so will taper from Wellbutrin and gradually taper Paroxetine to 40 mg. Provided tapering instructions. -Will continue to monitor.  Advised to follow up for [facility] and [facility] (patient is due for repeat lipid panel).    Meds ordered this encounter  Medications  . PARoxetine (PAXIL) 20 MG tablet    Sig: Take 1 tab by mouth daily for 2 weeks then increase to 2 tabs by mouth daily    Dispense:  90 tablet    Refill:  0    Follow-up: Return in about 3 months (around 04/01/2021) for [facility] with [facility]W 1 week before.   Note:  This note was prepared with assistance of Dragon voice recognition software. Occasional wrong-word or sound-a-like substitutions may have occurred due to the inherent limitations of voice recognition software.  Mayer Masker, PA-C

## 2021-01-01 NOTE — Patient Instructions (Signed)
Take Wellbutrin daily for 1 week then take every other day for 1 week then discontinue Wellbutrin.    Major Depressive Disorder, Adult Major depressive disorder (MDD) is a mental health condition. MDD often makes you feel sad, hopeless, or helpless. MDD can also cause symptoms in your body. MDD can affect your:  Work.  School.  Relationships.  Other normal activities. MDD can range from mild to very bad. It may occur once (single episode MDD). It can also occur many times (recurrent MDD). The main symptoms of MDD often include:  Feeling sad, depressed, or irritable most of the time.  Loss of interest. MDD symptoms also include:  Sleeping too much or too little.  Eating too much or too little.  A change in your weight.  Feeling tired (fatigue) or having low energy.  Feeling worthless.  Feeling guilty.  Trouble making decisions.  Trouble thinking clearly.  Thoughts of suicide or harming others.  Feeling weak.  Feeling agitated.  Keeping yourself from being around other people (isolation). Follow these instructions at home: Activity  Do these things as told by your doctor: ? Go back to your normal activities. ? Exercise regularly. ? Spend time outdoors. Alcohol  Talk with your doctor about how alcohol can affect your antidepressant medicines.  Do not drink alcohol. Or, limit how much alcohol you drink. ? This means no more than 1 drink a day for nonpregnant women and 2 drinks a day for men. One drink equals one of these:  12 oz of beer.  5 oz of wine.  1 oz of hard liquor. General instructions  Take over-the-counter and prescription medicines only as told by your doctor.  Eat a healthy diet.  Get plenty of sleep.  Find activities that you enjoy. Make time to do them.  Think about joining a support group. Your doctor may be able to suggest a group for you.  Keep all follow-up visits as told by your doctor. This is important. Where to find more  information:  [employer] on Mental Illness: ? www.nami.org  [place] [employer] of Mental Health: ? http://www.maynard.net/  National Suicide Prevention Lifeline: ? (661)222-0453. This is free, 24-hour help. Contact a doctor if:  Your symptoms get worse.  You have new symptoms. Get help right away if:  You self-harm.  You see, hear, taste, smell, or feel things that are not present (hallucinate). If you ever feel like you may hurt yourself or others, or have thoughts about taking your own life, get help right away. You can go to your nearest emergency department or call:  Your local emergency services (911 in the [place]  A suicide crisis helpline, such as the National Suicide Prevention Lifeline: ? 971-226-8550. This is open 24 hours a day. This information is not intended to replace advice given to you by your health care provider. Make sure you discuss any questions you have with your health care provider. Document Revised: 11/25/2017 Document Reviewed: 08/29/2016 Elsevier Patient Education  2020 [employer].

## 2021-01-05 ENCOUNTER — Other Ambulatory Visit: Payer: Self-pay | Admitting: Cardiology

## 2021-01-06 ENCOUNTER — Ambulatory Visit: Payer: Medicare PPO | Admitting: Cardiology

## 2021-01-06 ENCOUNTER — Encounter: Payer: Self-pay | Admitting: Cardiology

## 2021-01-06 ENCOUNTER — Other Ambulatory Visit: Payer: Self-pay

## 2021-01-06 VITALS — BP 160/90 | HR 82 | Ht 60.0 in | Wt 126.0 lb

## 2021-01-06 DIAGNOSIS — I10 Essential (primary) hypertension: Secondary | ICD-10-CM | POA: Diagnosis not present

## 2021-01-06 DIAGNOSIS — I2510 Atherosclerotic heart disease of native coronary artery without angina pectoris: Secondary | ICD-10-CM | POA: Diagnosis not present

## 2021-01-06 DIAGNOSIS — I480 Paroxysmal atrial fibrillation: Secondary | ICD-10-CM | POA: Diagnosis not present

## 2021-01-06 NOTE — Progress Notes (Signed)
Cardiology Office Note:    Date:  01/06/2021   ID:  Christina SportLita Mcwhirter, DOB 07/28/1929, MRN [id number]  PCP:  Mayer MaskerAbonza, Maritza, PA-C  Cardiologist:  Donato SchultzMark Yuritza Paulhus, MD  Electrophysiologist:  None   Referring MD: Mayer MaskerAbonza, Maritza, PA-C   CC: Lower extremity edema  History of Present Illness:    Christina Ortiz is a 87 y.o. female here for the follow-up of coronary artery disease, persistent atrial fibrillation, chronic anticoagulation.   2 prior stents in RCA and LAD in 2006, paroxysmal atrial fibrillation on Xarelto 15 with Mobitz type I second-degree AV block   Previously, Lasix was stopped by her daughter because of nocturia. Her lower extremity edema is stable.  She has been on Xarelto without any bleeding issues.    Past Medical History:  Diagnosis Date  . Acute bronchitis 10/2017  . Arthritis   . CKD (chronic kidney disease)    Xarelto dose is 15 mg QD  . Coronary artery disease    a. s/p PCI/stenting to RCA and LAD [place] [place] in [place]; b. Nuc 1/15: No ischemia, EF 70%  . Depression   . Gallstones   . Gastritis   . GERD (gastroesophageal reflux disease)   . Hiatal hernia   . Hip fracture (HCC)   . History of cholecystectomy   . History of echocardiogram    a. Echo 1/15: Moderate LVH, EF 55-60%, impaired relaxation, mild AS  . Hyperlipidemia   . Hypertension   . Hypokalemia   . Hypothyroidism   . Mobitz type 1 second degree atrioventricular block    a. Event Monitor 3/15: NSR, first-degree AV block, second-degree AB block - Mobitz 1, PACs, NSVT (5 beats)  . PAF (paroxysmal atrial fibrillation) (HCC)   . Thyroid disease     Past Surgical History:  Procedure Laterality Date  . ANGIOPLASTY  2006   with sent placement  . BACK SURGERY     Dr Shon BatonBrooks  . BILIARY DILATION  10/18/2019   Procedure: BILIARY DILATION;  Surgeon: Rachael FeeJacobs, Daniel P, MD;  [facility];  Service: Endoscopy;;  . BILIARY DILATION  12/17/2019   Procedure: BILIARY  DILATION;  Surgeon: Meryl DareStark, Malcolm T, MD;  [facility];  Service: Endoscopy;;  . BILIARY DILATION  02/07/2020   Procedure: BILIARY DILATION;  Surgeon: Lemar LoftyMansouraty, Gabriel Jr., MD;  [facility];  Service: Gastroenterology;;  . BILIARY DILATION  09/10/2020   Procedure: BILIARY DILATION;  Surgeon: Lemar LoftyMansouraty, Gabriel Jr., MD;  [facility];  Service: Gastroenterology;;  . BILIARY STENT PLACEMENT N/A 10/18/2019   Procedure: BILIARY STENT PLACEMENT;  Surgeon: Rachael FeeJacobs, Daniel P, MD;  [facility];  Service: Endoscopy;  Laterality: N/A;  . BILIARY STENT PLACEMENT N/A 12/17/2019   Procedure: BILIARY STENT PLACEMENT;  Surgeon: Meryl DareStark, Malcolm T, MD;  [facility];  Service: Endoscopy;  Laterality: N/A;  . BILIARY STENT PLACEMENT  02/07/2020   Procedure: BILIARY STENT PLACEMENT;  Surgeon: Meridee ScoreMansouraty, Netty StarringGabriel Jr., MD;  [facility];  Service: Gastroenterology;;  . BLEPHAROPLASTY    . BREAST LUMPECTOMY Left   . CARDIAC CATHETERIZATION  2006  . CATARACT EXTRACTION Bilateral   . CHOLECYSTECTOMY    . DILATION AND CURETTAGE OF UTERUS    . ENDOSCOPIC RETROGRADE CHOLANGIOPANCREATOGRAPHY (ERCP) WITH PROPOFOL N/A 12/17/2019   Procedure: ENDOSCOPIC RETROGRADE CHOLANGIOPANCREATOGRAPHY (ERCP) WITH PROPOFOL;  Surgeon: Meryl DareStark, Malcolm T, MD;  [facility];  Service: Endoscopy;  Laterality: N/A;  . ENDOSCOPIC RETROGRADE CHOLANGIOPANCREATOGRAPHY (ERCP) WITH PROPOFOL N/A 02/07/2020   Procedure:  ENDOSCOPIC RETROGRADE CHOLANGIOPANCREATOGRAPHY (ERCP) WITH PROPOFOL;  Surgeon: Meridee Score Netty Starring., MD;  Location[facility];  Service: Gastroenterology;  Laterality: N/A;  . ENDOSCOPIC RETROGRADE CHOLANGIOPANCREATOGRAPHY (ERCP) WITH PROPOFOL N/A 09/10/2020   Procedure: ENDOSCOPIC RETROGRADE CHOLANGIOPANCREATOGRAPHY (ERCP) WITH PROPOFOL;  Surgeon: Meridee Score Netty Starring., MD;  Location[facility];  Service: Gastroenterology;  Laterality: N/A;  . ERCP N/A  10/18/2019   Procedure: ENDOSCOPIC RETROGRADE CHOLANGIOPANCREATOGRAPHY (ERCP);  Surgeon: Rachael Fee, MD;  [facility];  Service: Endoscopy;  Laterality: N/A;  . ESOPHAGOGASTRODUODENOSCOPY (EGD) WITH PROPOFOL N/A 12/17/2019   Procedure: ESOPHAGOGASTRODUODENOSCOPY (EGD) WITH PROPOFOL;  Surgeon: [name], MD;  [facility];  Service: Endoscopy;  Laterality: N/A;  . KYPHOPLASTY N/A 01/03/2020   Procedure: KYPHOPLASTY LUMBAR TWO;  Surgeon: Venita Lick, MD;  [facility]: [facility];  Service: Orthopedics;  Laterality: N/A;  . REMOVAL OF STONES  10/18/2019   Procedure: REMOVAL OF STONES;  Surgeon: Rachael Fee, MD;  [facility];  Service: Endoscopy;;  . REMOVAL OF STONES  12/17/2019   Procedure: REMOVAL OF STONES;  Surgeon: [name], MD;  [facility];  Service: Endoscopy;;  . REMOVAL OF STONES  02/07/2020   Procedure: REMOVAL OF STONES;  Surgeon: Lemar Lofty., MD;  Location: [facility];  Service: Gastroenterology;;  . REMOVAL OF STONES  09/10/2020   Procedure: REMOVAL OF STONES;  Surgeon: Lemar Lofty., MD;  Location: [facility] ENDOSCOPY;  Service: Gastroenterology;;  . Gaspar Bidding DILATION N/A 12/17/2019   Procedure: Gaspar Bidding DILATION;  Surgeon: [name], MD;  [facility];  Service: Endoscopy;  Laterality: N/A;  . SPHINCTEROTOMY  10/18/2019   Procedure: SPHINCTEROTOMY;  Surgeon: Rachael Fee, MD;  [facility];  Service: Endoscopy;;  . Dennison Mascot  12/17/2019   Procedure: Dennison Mascot;  Surgeon: [name], MD;  [facility];  Service: Endoscopy;;  . Burman Freestone CHOLANGIOSCOPY N/A 02/07/2020   Procedure: SAYTKZSW CHOLANGIOSCOPY;  Surgeon: Lemar Lofty., MD;  Location[facility] ENDOSCOPY;  Service: Gastroenterology;  Laterality: N/A;  Burman Freestone LITHOTRIPSY N/A 02/07/2020   Procedure: FUXNATFT LITHOTRIPSY;  Surgeon: Lemar Lofty., MD;  Location: [facility] ENDOSCOPY;  Service:  Gastroenterology;  Laterality: N/A;  . STENT REMOVAL  12/17/2019   Procedure: STENT REMOVAL;  Surgeon: [name], MD;  [facility];  Service: Endoscopy;;  . STENT REMOVAL  02/07/2020   Procedure: STENT REMOVAL;  Surgeon: Lemar Lofty., MD;  Location: [facility];  Service: Gastroenterology;;  . Francine Graven REMOVAL  09/10/2020   Procedure: STENT REMOVAL;  Surgeon: Lemar Lofty., MD;  Location: [facility] ENDOSCOPY;  Service: Gastroenterology;;    Current Medications: Current Meds  Medication Sig  . acetaminophen (TYLENOL) 325 MG tablet Take 650 mg by mouth every 6 (six) hours as needed for moderate pain (for pain.).   Marland Kitchen amLODipine (NORVASC) 5 MG tablet TAKE 1 TABLET BY MOUTH DAILY.  Marland Kitchen antiseptic oral rinse (BIOTENE) LIQD 15 mLs by Mouth Rinse route daily.  Marland Kitchen buPROPion (WELLBUTRIN XL) 150 MG 24 hr tablet Take 1 tablet (150 mg total) by mouth daily.  Marland Kitchen CALCIUM-MAGNESIUM-ZINC PO Take 1 tablet by mouth daily.   . fesoterodine (TOVIAZ) 4 MG TB24 tablet Take 4 mg by mouth daily.  . folic acid (FOLVITE) 1 MG tablet Take 1 tablet (1 mg total) by mouth daily. **NEEDS APT FOR FURTHER REFILLS**  . levothyroxine (SYNTHROID) 75 MCG tablet Take 1 tablet (75 mcg total) by mouth daily before breakfast. **PATIENT NEEDS LAB FOR FURTHER REFILLS**  . lidocaine (LIDODERM)  5 % Place 1 patch onto the skin daily. Remove & Discard patch within 12 hours or as directed by MD  . metoprolol tartrate (LOPRESSOR) 25 MG tablet Take 0.5 tablets (12.5 mg total) by mouth 2 (two) times daily. **NEEDS APT FOR FURTHER MED REFILLS**  . omeprazole (PRILOSEC) 20 MG capsule TAKE 1 CAPSULE BY MOUTH DAILY AS NEEDED FOR HEARTBURN.  Marland Kitchen PARoxetine (PAXIL) 20 MG tablet Take 1 tab by mouth daily for 2 weeks then increase to 2 tabs by mouth daily  . potassium chloride SA (KLOR-CON) 20 MEQ tablet Take 1 tablet (20 mEq total) by mouth daily. Please keep upcoming schedule for future refill  . Propylene Glycol 0.6 % SOLN  Place 1 drop into both eyes daily.  . quinapril (ACCUPRIL) 40 MG tablet Take 1 tablet (40 mg total) by mouth daily.  . simvastatin (ZOCOR) 5 MG tablet TAKE 1 TABLET BY MOUTH EVERY EVENING.  Marland Kitchen spironolactone (ALDACTONE) 25 MG tablet Take 0.5 tablets (12.5 mg total) by mouth daily.  Marland Kitchen VITAMIN D PO Take 1 tablet by mouth at bedtime.  [name] 15 MG TABS tablet TAKE 1 TABLET BY MOUTH DAILY WITH SUPPER.     Allergies:   Patient has no active allergies.   Social History   Socioeconomic History  . Marital status: Widowed    Spouse name: Not on file  . Number of children: 1  . Years of education: Not on file  . Highest education level: Not on file  Occupational History  . Occupation: retired  Tobacco Use  . Smoking status: Former Smoker    Packs/day: 0.50    Years: 67.00    Pack years: 33.50    Types: Cigarettes    Quit date: 10/24/2014    Years since quitting: 6.2  . Smokeless tobacco: Never Used  . Tobacco comment: quit 2015  Vaping Use  . Vaping Use: Never used  Substance and Sexual Activity  . Alcohol use: Yes    Alcohol/week: 4.0 standard drinks    Types: 4 Shots of liquor per week    Comment: vodka  . Drug use: No  . Sexual activity: Never    Birth control/protection: Post-menopausal  Other Topics Concern  . Not on file  Social History Narrative  . Not on file   Social Determinants of Health   Financial Resource Strain: Not on file  Food Insecurity: Not on file  Transportation Needs: Not on file  Physical Activity: Not on file  Stress: Not on file  Social Connections: Not on file     Family History: The patient's family history includes Esophageal cancer in her paternal uncle; Heart attack in her mother; Heart disease in her mother; Heart failure in her brother; Lung cancer in her father; Lymphoma in her brother. There is no history of Colon cancer, Colon polyps, Rectal cancer, or Stomach cancer.  ROS:   Please see the history of present illness.    No  syncope, no bleeding, no orthopnea all other systems reviewed and are negative.  EKGs/Labs/Other Studies Reviewed:    The following studies were reviewed today: Echocardiogram pending  EKG: EKG today shows atrial fibrillation 82 bpm no other changes  Recent Labs: 07/23/2020: ALT 8; BUN 24; Creatinine, Ser 1.32; Hemoglobin 14.6; Platelets 224; Potassium 4.2; Sodium 136 11/14/2020: TSH 1.980  Recent Lipid Panel    Component Value Date/Time   CHOL 146 07/05/2019 1121   TRIG 71 07/05/2019 1121   HDL 57 07/05/2019 1121   CHOLHDL 2.6  07/05/2019 1121   LDLCALC 75 07/05/2019 1121    Physical Exam:    VS:  BP (!) 160/90 (BP Location: Left Arm, Patient Position: Sitting, Cuff Size: Normal)   Pulse 82   Ht 5' (1.524 m)   Wt 126 lb (57.2 kg)   LMP  (LMP Unknown)   BMI 24.61 kg/m     Wt Readings from Last 3 Encounters:  01/06/21 126 lb (57.2 kg)  01/01/21 126 lb 14.4 oz (57.6 kg)  09/10/20 135 lb (61.2 kg)     GEN: Well nourished, well developed, in no acute distress  HEENT: normal  Neck: no JVD, carotid bruits, or masses Cardiac: RRR; no murmurs, rubs, or gallops,no edema  Respiratory:  clear to auscultation bilaterally, normal work of breathing GI: soft, nontender, nondistended, + BS MS: no deformity or atrophy  Skin: warm and dry, no rash Neuro:  Alert and Oriented x 3, Strength and sensation are intact Psych: euthymic mood, full affect   ASSESSMENT:    1. PAF (paroxysmal atrial fibrillation) (HCC)   2. Essential hypertension   3. Atherosclerosis of native coronary artery of native heart without angina pectoris    PLAN:    In order of problems listed above:  Perm atrial fibrillation -Continue with Xarelto 15 mg a day secondary to creatinine clearance adjustment.  Try to discourage heavy alcohol use.  We do not want her to fall.  We do not want her to bleed.  AV block noted on ECG.  Careful with metoprolol. No changes made. Doing well on current medicine. She is no  longer driving.  Chronic anticoagulation -Xarelto 15 mg.  Keeping track of CBC. Hemoglobin 14.6 creatinine 1.3  Coronary artery disease -Both LAD and RCA stent placed in 2006.  Nuclear stress test in 2012 was reassuring.  No significant anginal symptoms.  Lower extremity edema/ankle/pedal edema -Her daughter ended up stopping the furosemide because of nocturia. Her lower extremity edema actually is doing quite well. No changes.  Former smoker -Quit 2015. Shortness of breath with activity.    Medication Adjustments/Labs and Tests Ordered: Current medicines are reviewed at length with the patient today.  Concerns regarding medicines are outlined above.  Orders Placed This Encounter  Procedures  . EKG 12-Lead   No orders of the defined types were placed in this encounter.   Patient Instructions  Medication Instructions:  The current medical regimen is effective;  continue present plan and medications.  *If you need a refill on your cardiac medications before your next appointment, please call your pharmacy*  Follow-Up: At [facility], you and your health needs are our priority.  As part of our continuing mission to provide you with exceptional heart care, we have created designated Provider Care Teams.  These Care Teams include your primary Cardiologist (physician) and Advanced Practice Providers (APPs -  Physician Assistants and Nurse Practitioners) who all work together to provide you with the care you need, when you need it.  We recommend signing up for the patient portal called "MyChart".  Sign up information is provided on this After Visit Summary.  MyChart is used to connect with patients for Virtual Visits (Telemedicine).  Patients are able to view lab/test results, encounter notes, upcoming appointments, etc.  Non-urgent messages can be sent to your provider as well.   To learn more about what you can do with MyChart, go to [email or web address].    Your next  appointment:   6 month(s)  The format for your next appointment:  In Person  Provider:   Donato Schultz, MD   Thank you for choosing [facility]!!         Signed, Donato Schultz, MD  01/06/2021 5:25 PM    [facility]

## 2021-01-06 NOTE — Patient Instructions (Signed)

## 2021-01-07 ENCOUNTER — Other Ambulatory Visit: Payer: Self-pay | Admitting: Physician Assistant

## 2021-01-07 ENCOUNTER — Other Ambulatory Visit: Payer: Self-pay | Admitting: Cardiology

## 2021-01-24 ENCOUNTER — Inpatient Hospital Stay (HOSPITAL_COMMUNITY)
Admission: EM | Admit: 2021-01-24 | Discharge: 2021-01-30 | DRG: 481 | Disposition: A | Discharge status: Skilled Nursing Facility | Payer: Medicare PPO | Attending: Internal Medicine | Admitting: Internal Medicine

## 2021-01-24 ENCOUNTER — Emergency Department (HOSPITAL_COMMUNITY): Payer: Medicare PPO

## 2021-01-24 ENCOUNTER — Other Ambulatory Visit: Payer: Self-pay

## 2021-01-24 ENCOUNTER — Encounter (HOSPITAL_COMMUNITY): Payer: Self-pay | Admitting: Emergency Medicine

## 2021-01-24 DIAGNOSIS — Z955 Presence of coronary angioplasty implant and graft: Secondary | ICD-10-CM

## 2021-01-24 DIAGNOSIS — I4891 Unspecified atrial fibrillation: Secondary | ICD-10-CM | POA: Diagnosis not present

## 2021-01-24 DIAGNOSIS — G319 Degenerative disease of nervous system, unspecified: Secondary | ICD-10-CM | POA: Diagnosis not present

## 2021-01-24 DIAGNOSIS — S72001A Fracture of unspecified part of neck of right femur, initial encounter for closed fracture: Secondary | ICD-10-CM | POA: Diagnosis not present

## 2021-01-24 DIAGNOSIS — I6523 Occlusion and stenosis of bilateral carotid arteries: Secondary | ICD-10-CM | POA: Diagnosis not present

## 2021-01-24 DIAGNOSIS — Z20822 Contact with and (suspected) exposure to covid-19: Secondary | ICD-10-CM | POA: Diagnosis present

## 2021-01-24 DIAGNOSIS — W19XXXA Unspecified fall, initial encounter: Secondary | ICD-10-CM | POA: Diagnosis not present

## 2021-01-24 DIAGNOSIS — S0990XA Unspecified injury of head, initial encounter: Secondary | ICD-10-CM | POA: Diagnosis not present

## 2021-01-24 DIAGNOSIS — I6782 Cerebral ischemia: Secondary | ICD-10-CM | POA: Diagnosis not present

## 2021-01-24 DIAGNOSIS — I129 Hypertensive chronic kidney disease with stage 1 through stage 4 chronic kidney disease, or unspecified chronic kidney disease: Secondary | ICD-10-CM | POA: Diagnosis present

## 2021-01-24 DIAGNOSIS — Z7901 Long term (current) use of anticoagulants: Secondary | ICD-10-CM

## 2021-01-24 DIAGNOSIS — I2510 Atherosclerotic heart disease of native coronary artery without angina pectoris: Secondary | ICD-10-CM | POA: Diagnosis present

## 2021-01-24 DIAGNOSIS — R54 Age-related physical debility: Secondary | ICD-10-CM | POA: Diagnosis present

## 2021-01-24 DIAGNOSIS — I4892 Unspecified atrial flutter: Secondary | ICD-10-CM | POA: Diagnosis not present

## 2021-01-24 DIAGNOSIS — D72829 Elevated white blood cell count, unspecified: Secondary | ICD-10-CM | POA: Diagnosis present

## 2021-01-24 DIAGNOSIS — E871 Hypo-osmolality and hyponatremia: Secondary | ICD-10-CM | POA: Diagnosis not present

## 2021-01-24 DIAGNOSIS — M1990 Unspecified osteoarthritis, unspecified site: Secondary | ICD-10-CM | POA: Diagnosis present

## 2021-01-24 DIAGNOSIS — E039 Hypothyroidism, unspecified: Secondary | ICD-10-CM | POA: Diagnosis present

## 2021-01-24 DIAGNOSIS — Z79899 Other long term (current) drug therapy: Secondary | ICD-10-CM

## 2021-01-24 DIAGNOSIS — K219 Gastro-esophageal reflux disease without esophagitis: Secondary | ICD-10-CM | POA: Diagnosis present

## 2021-01-24 DIAGNOSIS — S72141A Displaced intertrochanteric fracture of right femur, initial encounter for closed fracture: Secondary | ICD-10-CM | POA: Diagnosis not present

## 2021-01-24 DIAGNOSIS — Z4789 Encounter for other orthopedic aftercare: Secondary | ICD-10-CM | POA: Diagnosis not present

## 2021-01-24 DIAGNOSIS — Z8249 Family history of ischemic heart disease and other diseases of the circulatory system: Secondary | ICD-10-CM | POA: Diagnosis not present

## 2021-01-24 DIAGNOSIS — S72001D Fracture of unspecified part of neck of right femur, subsequent encounter for closed fracture with routine healing: Secondary | ICD-10-CM | POA: Diagnosis not present

## 2021-01-24 DIAGNOSIS — I441 Atrioventricular block, second degree: Secondary | ICD-10-CM | POA: Diagnosis present

## 2021-01-24 DIAGNOSIS — Z87891 Personal history of nicotine dependence: Secondary | ICD-10-CM | POA: Diagnosis not present

## 2021-01-24 DIAGNOSIS — F32A Depression, unspecified: Secondary | ICD-10-CM | POA: Diagnosis not present

## 2021-01-24 DIAGNOSIS — M25551 Pain in right hip: Secondary | ICD-10-CM | POA: Diagnosis present

## 2021-01-24 DIAGNOSIS — I10 Essential (primary) hypertension: Secondary | ICD-10-CM | POA: Diagnosis present

## 2021-01-24 DIAGNOSIS — M2550 Pain in unspecified joint: Secondary | ICD-10-CM | POA: Diagnosis not present

## 2021-01-24 DIAGNOSIS — R0902 Hypoxemia: Secondary | ICD-10-CM | POA: Diagnosis not present

## 2021-01-24 DIAGNOSIS — I480 Paroxysmal atrial fibrillation: Secondary | ICD-10-CM | POA: Diagnosis not present

## 2021-01-24 DIAGNOSIS — Z79890 Hormone replacement therapy (postmenopausal): Secondary | ICD-10-CM | POA: Diagnosis not present

## 2021-01-24 DIAGNOSIS — E876 Hypokalemia: Secondary | ICD-10-CM | POA: Diagnosis not present

## 2021-01-24 DIAGNOSIS — E785 Hyperlipidemia, unspecified: Secondary | ICD-10-CM | POA: Diagnosis present

## 2021-01-24 DIAGNOSIS — Z7401 Bed confinement status: Secondary | ICD-10-CM | POA: Diagnosis not present

## 2021-01-24 DIAGNOSIS — N179 Acute kidney failure, unspecified: Secondary | ICD-10-CM | POA: Diagnosis not present

## 2021-01-24 DIAGNOSIS — D7589 Other specified diseases of blood and blood-forming organs: Secondary | ICD-10-CM | POA: Diagnosis present

## 2021-01-24 DIAGNOSIS — W010XXA Fall on same level from slipping, tripping and stumbling without subsequent striking against object, initial encounter: Secondary | ICD-10-CM | POA: Diagnosis present

## 2021-01-24 DIAGNOSIS — N1831 Chronic kidney disease, stage 3a: Secondary | ICD-10-CM | POA: Diagnosis not present

## 2021-01-24 DIAGNOSIS — Z4889 Encounter for other specified surgical aftercare: Secondary | ICD-10-CM | POA: Diagnosis not present

## 2021-01-24 LAB — CBC WITH DIFFERENTIAL/PLATELET
Abs Immature Granulocytes: 0.1 10*3/uL — ABNORMAL HIGH (ref 0.00–0.07)
Basophils Absolute: 0 10*3/uL (ref 0.0–0.1)
Basophils Relative: 0 %
Eosinophils Absolute: 0.1 10*3/uL (ref 0.0–0.5)
Eosinophils Relative: 1 %
HCT: 49.4 % — ABNORMAL HIGH (ref 36.0–46.0)
Hemoglobin: 16.2 g/dL — ABNORMAL HIGH (ref 12.0–15.0)
Immature Granulocytes: 1 %
Lymphocytes Relative: 7 %
Lymphs Abs: 0.9 10*3/uL (ref 0.7–4.0)
MCH: 33.5 pg (ref 26.0–34.0)
MCHC: 32.8 g/dL (ref 30.0–36.0)
MCV: 102.1 fL — ABNORMAL HIGH (ref 80.0–100.0)
Monocytes Absolute: 0.6 10*3/uL (ref 0.1–1.0)
Monocytes Relative: 5 %
Neutro Abs: 10.7 10*3/uL — ABNORMAL HIGH (ref 1.7–7.7)
Neutrophils Relative %: 86 %
Platelets: 206 10*3/uL (ref 150–400)
RBC: 4.84 MIL/uL (ref 3.87–5.11)
RDW: 13 % (ref 11.5–15.5)
WBC: 12.4 10*3/uL — ABNORMAL HIGH (ref 4.0–10.5)
nRBC: 0 % (ref 0.0–0.2)

## 2021-01-24 LAB — PROTIME-INR
INR: 1.5 — ABNORMAL HIGH (ref 0.8–1.2)
Prothrombin Time: 17.5 seconds — ABNORMAL HIGH (ref 11.4–15.2)

## 2021-01-24 LAB — I-STAT CHEM 8, ED
BUN: 45 mg/dL — ABNORMAL HIGH (ref 8–23)
Calcium, Ion: 1.09 mmol/L — ABNORMAL LOW (ref 1.15–1.40)
Chloride: 106 mmol/L (ref 98–111)
Creatinine, Ser: 0.9 mg/dL (ref 0.44–1.00)
Glucose, Bld: 115 mg/dL — ABNORMAL HIGH (ref 70–99)
HCT: 48 % — ABNORMAL HIGH (ref 36.0–46.0)
Hemoglobin: 16.3 g/dL — ABNORMAL HIGH (ref 12.0–15.0)
Potassium: 7.7 mmol/L (ref 3.5–5.1)
Sodium: 137 mmol/L (ref 135–145)
TCO2: 28 mmol/L (ref 22–32)

## 2021-01-24 LAB — BASIC METABOLIC PANEL
Anion gap: 11 (ref 5–15)
BUN: 26 mg/dL — ABNORMAL HIGH (ref 8–23)
CO2: 22 mmol/L (ref 22–32)
Calcium: 9.2 mg/dL (ref 8.9–10.3)
Chloride: 105 mmol/L (ref 98–111)
Creatinine, Ser: 0.97 mg/dL (ref 0.44–1.00)
GFR, Estimated: 56 mL/min — ABNORMAL LOW (ref >60)
Glucose, Bld: 110 mg/dL — ABNORMAL HIGH (ref 70–99)
Potassium: 4.4 mmol/L (ref 3.5–5.1)
Sodium: 138 mmol/L (ref 135–145)

## 2021-01-24 LAB — SARS CORONAVIRUS 2 BY RT PCR (HOSPITAL ORDER, PERFORMED IN ~~LOC~~ HOSPITAL LAB): SARS Coronavirus 2: NEGATIVE

## 2021-01-24 LAB — TYPE AND SCREEN
ABO/RH(D): O POS
Antibody Screen: NEGATIVE

## 2021-01-24 MED ORDER — ONDANSETRON HCL 4 MG/2ML IJ SOLN
4.0000 mg | Freq: Once | INTRAMUSCULAR | Status: AC
  Administered 2021-01-24: 4 mg via INTRAVENOUS
  Filled 2021-01-24: qty 2

## 2021-01-24 MED ORDER — FENTANYL CITRATE (PF) 100 MCG/2ML IJ SOLN
50.0000 ug | INTRAMUSCULAR | Status: AC | PRN
Start: 2021-01-24 — End: 2021-01-24
  Administered 2021-01-24 (×2): 50 ug via INTRAVENOUS
  Filled 2021-01-24 (×2): qty 2

## 2021-01-24 MED ORDER — ACETAMINOPHEN 650 MG RE SUPP: 650.0000 mg | Freq: Four times a day (QID) | RECTAL | Status: DC | PRN

## 2021-01-24 MED ORDER — FENTANYL CITRATE (PF) 100 MCG/2ML IJ SOLN
50.0000 ug | INTRAMUSCULAR | Status: DC | PRN
  Administered 2021-01-24 (×4): 50 ug via INTRAVENOUS
  Filled 2021-01-24 (×3): qty 2

## 2021-01-24 MED ORDER — METOPROLOL TARTRATE 25 MG PO TABS: 12.5000 mg | ORAL_TABLET | Freq: Two times a day (BID) | ORAL | Status: DC

## 2021-01-24 MED ORDER — ACETAMINOPHEN 325 MG PO TABS: 650.0000 mg | ORAL_TABLET | Freq: Four times a day (QID) | ORAL | Status: DC | PRN

## 2021-01-24 NOTE — ED Triage Notes (Signed)
Pt bib [facility] s/p fall on snow, R hip pain. Pttakes xarelto, denies LOC, unsure if hit head. EMS reports giving 50 mcg fentanyl en route.

## 2021-01-24 NOTE — ED Provider Notes (Signed)
[facility] EMERGENCY DEPARTMENT Provider Note   CSN: [id number] Arrival date & time: 01/24/21  1102     History Chief Complaint  Patient presents with  . Fall    Christina Ortiz is a 87 y.o. female.  Patient is an 87 year old female with a history of first-degree heart block, paroxysmal atrial fibrillation on anticoagulation, CKD, CAD who is presenting today with a complaint of right hip pain after a fall.  Patient was going out to get the paper and using her walker when she reports her walker slipped and tipped forward causing her to fall onto her right hip on the concrete.  She remembers the entire event and does not think she hit her head but is not sure.  She has 10 out of 10 pain with any attempt to move the right leg.  She was not able to get up or walk after the fall.  She denies any pain to the left lower extremity or upper extremities.  She denies any neck pain or headache.  She denies having symptoms of dizziness, lightheadedness, palpitations, chest pain, shortness of breath prior or after the event.  Patient reports she does live alone and usually ambulates with a walker at all times.  The history is provided by the EMS personnel and the patient.  Fall This is a new problem. The current episode started 1 to 2 hours ago. The problem occurs constantly. The problem has not changed since onset.Associated symptoms comments: Right hip pain. The symptoms are aggravated by bending. Nothing relieves the symptoms. Treatments tried: fentanyl. The treatment provided no relief.       Past Medical History:  Diagnosis Date  . Acute bronchitis 10/2017  . Arthritis   . CKD (chronic kidney disease)    Xarelto dose is 15 mg QD  . Coronary artery disease    a. s/p PCI/stenting to RCA and LAD ([facility] in [place]) in 2006; b. Nuc 1/15: No ischemia, EF 70%  . Depression   . Gallstones   . Gastritis   . GERD (gastroesophageal reflux disease)   . Hiatal hernia   .  Hip fracture (HCC)   . History of cholecystectomy   . History of echocardiogram    a. Echo 1/15: Moderate LVH, EF 55-60%, impaired relaxation, mild AS  . Hyperlipidemia   . Hypertension   . Hypokalemia   . Hypothyroidism   . Mobitz type 1 second degree atrioventricular block    a. Event Monitor 3/15: NSR, first-degree AV block, second-degree AB block - Mobitz 1, PACs, NSVT (5 beats)  . PAF (paroxysmal atrial fibrillation) (HCC)   . Thyroid disease     Patient Active Problem List   Diagnosis Date Noted  . Surgical counseling visit 12/10/2019  . History of esophageal stricture 11/02/2019  . Muscular deconditioning 11/01/2019  . Abnormal liver function tests   . Common bile duct calculus 10/16/2019  . Abnormal urinalysis 09/28/2019  . Adult failure to thrive syndrome 09/28/2019  . Dysphagia 09/06/2018  . Pain   . Closed fracture of femur, greater trochanter (HCC) 06/04/2018  . Hypoxia 06/03/2018  . Pain in right arm 04/10/2018  . Fall 03/31/2018  . Fracture of shaft of humerus 03/31/2018  . Disorder of skeletal muscle 03/31/2018  . Hypothermia 03/31/2018  . Healthcare maintenance 01/17/2018  . Nocturia 01/17/2018  . Macrocytosis 11/22/2017  . Acute bronchitis due to respiratory syncytial virus 11/09/2017  . Acute respiratory failure (HCC) 11/09/2017  . Hypertension 11/09/2017  .  Paroxysmal atrial fibrillation (HCC) 11/09/2017  . Coronary artery disease 11/09/2017  . CKD (chronic kidney disease) stage 2, GFR 60-89 ml/min 11/09/2017  . Hypothyroidism 11/09/2017  . Dyspnea 11/08/2017  . Upper respiratory infection 11/08/2017  . Serum creatinine raised 06/21/2017  . Ankle edema 06/21/2017  . Diarrhea 05/25/2017  . Hypokalemia 05/25/2017  . Alcohol abuse 05/25/2017  . Recurrent depression (HCC) 03/30/2017  . Low back pain 03/30/2017  . Influenza 01/22/2017  . Coronary atherosclerosis 04/04/2015  . Hypertensive disorder 04/04/2015  . Hyperlipidemia 04/04/2015  .  Ex-smoker 04/04/2015  . Chronic anticoagulation 04/04/2015    Past Surgical History:  Procedure Laterality Date  . ANGIOPLASTY  2006   with sent placement  . BACK SURGERY     Dr Shon Baton  . BILIARY DILATION  10/18/2019   Procedure: BILIARY DILATION;  Surgeon: Rachael Fee, MD;  Location: [facility] ENDOSCOPY;  Service: Endoscopy;;  . BILIARY DILATION  12/17/2019   Procedure: BILIARY DILATION;  Surgeon: [name], MD;  Location: [facility] ENDOSCOPY;  Service: Endoscopy;;  . BILIARY DILATION  02/07/2020   Procedure: BILIARY DILATION;  Surgeon: Lemar Lofty., MD;  Location: [facility] ENDOSCOPY;  Service: Gastroenterology;;  . BILIARY DILATION  09/10/2020   Procedure: BILIARY DILATION;  Surgeon: Lemar Lofty., MD;  Location: [facility] ENDOSCOPY;  Service: Gastroenterology;;  . BILIARY STENT PLACEMENT N/A 10/18/2019   Procedure: BILIARY STENT PLACEMENT;  Surgeon: Rachael Fee, MD;  Location: [facility] ENDOSCOPY;  Service: Endoscopy;  Laterality: N/A;  . BILIARY STENT PLACEMENT N/A 12/17/2019   Procedure: BILIARY STENT PLACEMENT;  Surgeon: [name], MD;  Location: [facility] ENDOSCOPY;  Service: Endoscopy;  Laterality: N/A;  . BILIARY STENT PLACEMENT  02/07/2020   Procedure: BILIARY STENT PLACEMENT;  Surgeon: Meridee Score Netty Starring., MD;  Location: [facility] ENDOSCOPY;  Service: Gastroenterology;;  . BLEPHAROPLASTY    . BREAST LUMPECTOMY Left   . CARDIAC CATHETERIZATION  2006  . CATARACT EXTRACTION Bilateral   . CHOLECYSTECTOMY    . DILATION AND CURETTAGE OF UTERUS    . ENDOSCOPIC RETROGRADE CHOLANGIOPANCREATOGRAPHY (ERCP) WITH PROPOFOL N/A 12/17/2019   Procedure: ENDOSCOPIC RETROGRADE CHOLANGIOPANCREATOGRAPHY (ERCP) WITH PROPOFOL;  Surgeon: [name], MD;  Location: [facility] ENDOSCOPY;  Service: Endoscopy;  Laterality: N/A;  . ENDOSCOPIC RETROGRADE CHOLANGIOPANCREATOGRAPHY (ERCP) WITH PROPOFOL N/A 02/07/2020   Procedure: ENDOSCOPIC RETROGRADE CHOLANGIOPANCREATOGRAPHY (ERCP) WITH PROPOFOL;  Surgeon:  Meridee Score Netty Starring., MD;  Location: [facility] ENDOSCOPY;  Service: Gastroenterology;  Laterality: N/A;  . ENDOSCOPIC RETROGRADE CHOLANGIOPANCREATOGRAPHY (ERCP) WITH PROPOFOL N/A 09/10/2020   Procedure: ENDOSCOPIC RETROGRADE CHOLANGIOPANCREATOGRAPHY (ERCP) WITH PROPOFOL;  Surgeon: Meridee Score Netty Starring., MD;  Location: [facility] ENDOSCOPY;  Service: Gastroenterology;  Laterality: N/A;  . ERCP N/A 10/18/2019   Procedure: ENDOSCOPIC RETROGRADE CHOLANGIOPANCREATOGRAPHY (ERCP);  Surgeon: Rachael Fee, MD;  Location: [facility] ENDOSCOPY;  Service: Endoscopy;  Laterality: N/A;  . ESOPHAGOGASTRODUODENOSCOPY (EGD) WITH PROPOFOL N/A 12/17/2019   Procedure: ESOPHAGOGASTRODUODENOSCOPY (EGD) WITH PROPOFOL;  Surgeon: [name], MD;  Location: [facility] ENDOSCOPY;  Service: Endoscopy;  Laterality: N/A;  . KYPHOPLASTY N/A 01/03/2020   Procedure: KYPHOPLASTY LUMBAR TWO;  Surgeon: Venita Lick, MD;  Location: [facility] OR;  Service: Orthopedics;  Laterality: N/A;  . REMOVAL OF STONES  10/18/2019   Procedure: REMOVAL OF STONES;  Surgeon: Rachael Fee, MD;  Location: [facility] ENDOSCOPY;  Service: Endoscopy;;  . REMOVAL OF STONES  12/17/2019   Procedure: REMOVAL OF STONES;  Surgeon: [name], MD;  Location: [facility] ENDOSCOPY;  Service: Endoscopy;;  . REMOVAL OF STONES  02/07/2020  Procedure: REMOVAL OF STONES;  Surgeon: Meridee Score Netty Starring., MD;  Location: [facility] ENDOSCOPY;  Service: Gastroenterology;;  . REMOVAL OF STONES  09/10/2020   Procedure: REMOVAL OF STONES;  Surgeon: Lemar Lofty., MD;  Location: [facility] ENDOSCOPY;  Service: Gastroenterology;;  . Gaspar Bidding DILATION N/A 12/17/2019   Procedure: Gaspar Bidding DILATION;  Surgeon: [name], MD;  Location: [facility] ENDOSCOPY;  Service: Endoscopy;  Laterality: N/A;  . SPHINCTEROTOMY  10/18/2019   Procedure: SPHINCTEROTOMY;  Surgeon: Rachael Fee, MD;  Location: [facility] ENDOSCOPY;  Service: Endoscopy;;  . Dennison Mascot  12/17/2019   Procedure: Dennison Mascot;  Surgeon: [name],  MD;  Location: [facility] ENDOSCOPY;  Service: Endoscopy;;  . Burman Freestone CHOLANGIOSCOPY N/A 02/07/2020   Procedure: GBTDVVOH CHOLANGIOSCOPY;  Surgeon: Lemar Lofty., MD;  Location: [facility] ENDOSCOPY;  Service: Gastroenterology;  Laterality: N/A;  Burman Freestone LITHOTRIPSY N/A 02/07/2020   Procedure: [id number] LITHOTRIPSY;  Surgeon: Lemar Lofty., MD;  Location: [facility] ENDOSCOPY;  Service: Gastroenterology;  Laterality: N/A;  . STENT REMOVAL  12/17/2019   Procedure: STENT REMOVAL;  Surgeon: [name], MD;  Location: [facility] ENDOSCOPY;  Service: Endoscopy;;  . Francine Graven REMOVAL  02/07/2020   Procedure: STENT REMOVAL;  Surgeon: Lemar Lofty., MD;  Location: [facility] ENDOSCOPY;  Service: Gastroenterology;;  . Francine Graven REMOVAL  09/10/2020   Procedure: STENT REMOVAL;  Surgeon: Lemar Lofty., MD;  Location: [facility] ENDOSCOPY;  Service: Gastroenterology;;     OB History   No obstetric history on file.     Family History  Problem Relation Age of Onset  . Heart disease Mother   . Heart attack Mother   . Lung cancer Father   . Lymphoma Brother   . Heart failure Brother   . Esophageal cancer Paternal Uncle   . Colon cancer Neg Hx   . Colon polyps Neg Hx   . Rectal cancer Neg Hx   . Stomach cancer Neg Hx     Social History   Tobacco Use  . Smoking status: Former Smoker    Packs/day: 0.50    Years: 67.00    Pack years: 33.50    Types: Cigarettes    Quit date: 10/24/2014    Years since quitting: 6.2  . Smokeless tobacco: Never Used  . Tobacco comment: quit 2015  Vaping Use  . Vaping Use: Never used  Substance Use Topics  . Alcohol use: Yes    Alcohol/week: 4.0 standard drinks    Types: 4 Shots of liquor per week    Comment: vodka  . Drug use: No    Home Medications Prior to Admission medications   Medication Sig Start Date End Date Taking? Authorizing Provider  acetaminophen (TYLENOL) 325 MG tablet Take 650 mg by mouth every 6 (six) hours as needed for moderate pain (for  pain.).     [provider]  amLODipine (NORVASC) 5 MG tablet TAKE 1 TABLET BY MOUTH DAILY. 04/18/20   Jake Bathe, MD  antiseptic oral rinse (BIOTENE) LIQD 15 mLs by Mouth Rinse route daily.    [provider]  buPROPion (WELLBUTRIN XL) 150 MG 24 hr tablet Take 1 tablet (150 mg total) by mouth daily. 07/11/20   Mayer Masker, PA-C  CALCIUM-MAGNESIUM-ZINC PO Take 1 tablet by mouth daily.     [provider]  fesoterodine (TOVIAZ) 4 MG TB24 tablet Take 4 mg by mouth daily.    [provider]  folic acid (FOLVITE) 1 MG tablet Take 1 tablet (1 mg total) by mouth daily. 01/07/21  Mayer MaskerAbonza, Maritza, PA-C  levothyroxine (SYNTHROID) 75 MCG tablet Take 1 tablet (75 mcg total) by mouth daily before breakfast. **PATIENT NEEDS LAB FOR FURTHER REFILLS** 12/01/20   Abonza, Maritza, PA-C  lidocaine (LIDODERM) 5 % Place 1 patch onto the skin daily. Remove & Discard patch within 12 hours or as directed by MD    [provider]  metoprolol tartrate (LOPRESSOR) 25 MG tablet Take 0.5 tablets (12.5 mg total) by mouth 2 (two) times daily. **NEEDS APT FOR FURTHER MED REFILLS** 12/01/20   Abonza, Maritza, PA-C  omeprazole (PRILOSEC) 20 MG capsule TAKE 1 CAPSULE BY MOUTH DAILY AS NEEDED FOR HEARTBURN. 09/04/19   Danford, Orpha BurKaty D, NP  PARoxetine (PAXIL) 20 MG tablet Take 1 tab by mouth daily for 2 weeks then increase to 2 tabs by mouth daily 01/01/21   Abonza, Maritza, PA-C  potassium chloride SA (KLOR-CON) 20 MEQ tablet Take 1 tablet (20 mEq total) by mouth daily. Please keep upcoming schedule for future refill 12/17/20   Jake BatheSkains, Mark C, MD  Propylene Glycol 0.6 % SOLN Place 1 drop into both eyes daily.    [provider]  quinapril (ACCUPRIL) 40 MG tablet Take 1 tablet (40 mg total) by mouth daily. 07/11/20   Mayer MaskerAbonza, Maritza, PA-C  simvastatin (ZOCOR) 5 MG tablet TAKE 1 TABLET BY MOUTH EVERY EVENING. 08/07/20   Mayer MaskerAbonza, Maritza, PA-C  spironolactone (ALDACTONE) 25 MG tablet TAKE  1/2 TABLET BY MOUTH DAILY 01/07/21   Jake BatheSkains, Mark C, MD  VITAMIN D PO Take 1 tablet by mouth at bedtime.    [provider]  XARELTO 15 MG TABS tablet TAKE 1 TABLET BY MOUTH DAILY WITH SUPPER. 12/22/20   Jake BatheSkains, Mark C, MD    Allergies    Patient has no active allergies.  Review of Systems   Review of Systems  All other systems reviewed and are negative.   Physical Exam Updated Vital Signs BP (!) 171/92 (BP Location: Right Arm)   Pulse 78   Temp 97.6 F (36.4 C) (Oral)   Resp 18   Ht 5\' 2"  (1.575 m)   Wt 57.2 kg   LMP  (LMP Unknown)   SpO2 94%   BMI 23.05 kg/m   Physical Exam Vitals and nursing note reviewed.  Constitutional:      General: She is not in acute distress.    Appearance: Normal appearance. She is well-developed, normal weight and well-nourished.  HENT:     Head: Normocephalic and atraumatic.     Mouth/Throat:     Mouth: Mucous membranes are moist.  Eyes:     Extraocular Movements: EOM normal.     Pupils: Pupils are equal, round, and reactive to light.  Cardiovascular:     Rate and Rhythm: Normal rate and regular rhythm.     Pulses: Normal pulses and intact distal pulses.     Heart sounds: Normal heart sounds. No murmur heard. No friction rub.  Pulmonary:     Effort: Pulmonary effort is normal.     Breath sounds: Normal breath sounds. No wheezing or rales.  Abdominal:     General: Bowel sounds are normal. There is no distension.     Palpations: Abdomen is soft.     Tenderness: There is no abdominal tenderness. There is no guarding or rebound.  Musculoskeletal:        General: Tenderness present. Normal range of motion.     Cervical back: Normal range of motion and neck supple. No tenderness.  Right knee: Normal.     Right ankle: Normal.     Comments: No edema.  Right hip is flexed and patient is unable to extend.  Pain with palpation over the right hip and proximal femur.  Lymphadenopathy:     Cervical: No cervical adenopathy.  Skin:     General: Skin is warm and dry.     Findings: No rash.  Neurological:     General: No focal deficit present.     Mental Status: She is alert and oriented to person, place, and time. Mental status is at baseline.     Cranial Nerves: No cranial nerve deficit.  Psychiatric:        Mood and Affect: Mood and affect and mood normal.        Behavior: Behavior normal.        Thought Content: Thought content normal.     ED Results / Procedures / Treatments   Labs (all labs ordered are listed, but only abnormal results are displayed) Labs Reviewed  BASIC METABOLIC PANEL  CBC WITH DIFFERENTIAL/PLATELET  PROTIME-INR  TYPE AND SCREEN    EKG None  Radiology CT HEAD WO CONTRAST  Result Date: 01/24/2021 CLINICAL DATA:  Head trauma, fell on snow, on anticoagulants EXAM: CT HEAD WITHOUT CONTRAST TECHNIQUE: Contiguous axial images were obtained from the base of the skull through the vertex without intravenous contrast. Sagittal and coronal MPR images reconstructed from axial data set. COMPARISON:  07/23/2020 FINDINGS: Brain: Generalized atrophy. Normal ventricular morphology. No midline shift or mass effect. Small vessel chronic ischemic changes of deep cerebral white matter. Small old LEFT basal ganglia lacunar infarct. Old RIGHT cerebellar infarct. No intracranial hemorrhage, mass lesion, or evidence of acute infarction. No extra-axial fluid collections. Vascular: No hyperdense vessels. Atherosclerotic calcification of internal carotid arteries at skull base Skull: Intact Sinuses/Orbits: Clear Other: N/A IMPRESSION: Atrophy with small vessel chronic ischemic changes of deep cerebral white matter. 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 No acute intracranial abnormalities. Electronically Signed   By: Ulyses Southward M.D.   On: 01/24/2021 11:58   DG Hip Unilat  With Pelvis 2-3 Views Right  Result Date: 01/24/2021 CLINICAL DATA:  Right hip pain and deformity following a fall today. EXAM: DG  HIP (WITH OR WITHOUT PELVIS) 2-3V RIGHT COMPARISON:  06/23/2020 FINDINGS: Interval acute, comminuted intertrochanteric fracture on the right with varus angulation. The previously demonstrated superior trochanter fracture fragments are unchanged. Diffuse osteopenia. IMPRESSION: Acute, comminuted right intertrochanteric fracture with varus angulation. Electronically Signed   By: Beckie Salts M.D.   On: 01/24/2021 12:04    Procedures Procedures   Medications Ordered in ED Medications  fentaNYL (SUBLIMAZE) injection 50 mcg (has no administration in time range)  ondansetron (ZOFRAN) injection 4 mg (has no administration in time range)    ED Course  I have reviewed the triage vital signs and the nursing notes.  Pertinent labs & imaging results that were available during my care of the patient were reviewed by me and considered in my medical decision making (see chart for details).    MDM Rules/Calculators/A&P                          Elderly female presenting today after a fall when her walker slipped on the ice.  She is complaining of significant right hip pain.  Concern for possible hip fracture versus pelvic fracture.  She is currently neurovascularly intact.  Patient is on Xarelto and  cannot recall if she hits her head but does not think so.  She had no loss of consciousness and neurologically at this time seems intact.  She is awake alert and able to answer all questions appropriately.  She has no cervical spine tenderness and full range of motion of her neck without tenderness and C-spine was cleared.  We will do a CT of the head just to ensure there is no acute process given fall and being on anticoagulation.  Hip imaging and routine labs are pending.  Patient given additional pain control she currently has pain of 10 out of 10. 12:28 PM Pt's head CT without acute findings.  Hip film shows an acute comminuted right intertrochanteric fx with varus angulation.  Pt sees [facility] and will  consult someone for her care.  Will plan on admission to the hospitalist.  Pt last took xarelto last night.    12:40 PM Spoke with Dr. Ranell Patrick who will see the pt and request Xarelto hold and mechanical ppx.  Final Clinical Impression(s) / ED Diagnoses Final diagnoses:  None    Rx / DC Orders ED Discharge Orders    None       Gwyneth Sprout, MD 01/24/21 1529

## 2021-01-24 NOTE — Progress Notes (Signed)
Orthopedic Tech Progress Note Patient Details:  Christina Ortiz Oct 25, 1929 [id number]  Musculoskeletal Traction Type of Traction: [place]ucks Skin Traction Traction Location: Right Lower Extremity Traction Weight: 10 lbs   Post Interventions Patient Tolerated: Well Instructions Provided: Care of device   Gerald Stabs 01/24/2021, 6:44 PM

## 2021-01-24 NOTE — ED Notes (Addendum)
Pt is in flutter on the monitor. NAD. Floor coverage paged just to be aware.

## 2021-01-24 NOTE — ED Provider Notes (Signed)
Discussed with hosptialist who will admit BMP unremarkable.    Christina Hong, MD 01/24/21 [phone]

## 2021-01-24 NOTE — Consult Note (Signed)
Reason for Consult:Right hip pain after fall Referring Physician: EDP  Christina Ortiz is an 87 y.o. female.  HPI: 87 yo female who fell in the snow this AM going out to get her paper. She lives alone in a single level apartment. She complained of immediate right hip pain and was unable to stand after the fall. She denies back pain or other complaints.   Past Medical History:  Diagnosis Date  . Acute bronchitis 10/2017  . Arthritis   . CKD (chronic kidney disease)    Xarelto dose is 15 mg QD  . Coronary artery disease    a. s/p PCI/stenting to RCA and LAD ([facility] in [place]) in 2006; b. Nuc 1/15: No ischemia, EF 70%  . Depression   . Gallstones   . Gastritis   . GERD (gastroesophageal reflux disease)   . Hiatal hernia   . Hip fracture (HCC)   . History of cholecystectomy   . History of echocardiogram    a. Echo 1/15: Moderate LVH, EF 55-60%, impaired relaxation, mild AS  . Hyperlipidemia   . Hypertension   . Hypokalemia   . Hypothyroidism   . Mobitz type 1 second degree atrioventricular block    a. Event Monitor 3/15: NSR, first-degree AV block, second-degree AB block - Mobitz 1, PACs, NSVT (5 beats)  . PAF (paroxysmal atrial fibrillation) (HCC)   . Thyroid disease     Past Surgical History:  Procedure Laterality Date  . ANGIOPLASTY  2006   with sent placement  . BACK SURGERY     Dr Shon Baton  . BILIARY DILATION  10/18/2019   Procedure: BILIARY DILATION;  Surgeon: Rachael Fee, MD;  Location: [facility];  Service: Endoscopy;;  . BILIARY DILATION  12/17/2019   Procedure: BILIARY DILATION;  Surgeon: [name], MD;  Location: [facility] ENDOSCOPY;  Service: Endoscopy;;  . BILIARY DILATION  02/07/2020   Procedure: BILIARY DILATION;  Surgeon: Lemar Lofty., MD;  Location: [facility] ENDOSCOPY;  Service: Gastroenterology;;  . BILIARY DILATION  09/10/2020   Procedure: BILIARY DILATION;  Surgeon: Lemar Lofty., MD;  Location: [facility] ENDOSCOPY;  Service:  Gastroenterology;;  . BILIARY STENT PLACEMENT N/A 10/18/2019   Procedure: BILIARY STENT PLACEMENT;  Surgeon: Rachael Fee, MD;  Location: [facility] ENDOSCOPY;  Service: Endoscopy;  Laterality: N/A;  . BILIARY STENT PLACEMENT N/A 12/17/2019   Procedure: BILIARY STENT PLACEMENT;  Surgeon: [name], MD;  Location: [facility] ENDOSCOPY;  Service: Endoscopy;  Laterality: N/A;  . BILIARY STENT PLACEMENT  02/07/2020   Procedure: BILIARY STENT PLACEMENT;  Surgeon: Meridee Score Netty Starring., MD;  Location: [facility] ENDOSCOPY;  Service: Gastroenterology;;  . BLEPHAROPLASTY    . BREAST LUMPECTOMY Left   . CARDIAC CATHETERIZATION  2006  . CATARACT EXTRACTION Bilateral   . CHOLECYSTECTOMY    . DILATION AND CURETTAGE OF UTERUS    . ENDOSCOPIC RETROGRADE CHOLANGIOPANCREATOGRAPHY (ERCP) WITH PROPOFOL N/A 12/17/2019   Procedure: ENDOSCOPIC RETROGRADE CHOLANGIOPANCREATOGRAPHY (ERCP) WITH PROPOFOL;  Surgeon: [name], MD;  Location: [facility]OSCOPY;  Service: Endoscopy;  Laterality: N/A;  . ENDOSCOPIC RETROGRADE CHOLANGIOPANCREATOGRAPHY (ERCP) WITH PROPOFOL N/A 02/07/2020   Procedure: ENDOSCOPIC RETROGRADE CHOLANGIOPANCREATOGRAPHY (ERCP) WITH PROPOFOL;  Surgeon: Meridee Score Netty Starring., MD;  Location: [facility] ENDOSCOPY;  Service: Gastroenterology;  Laterality: N/A;  . ENDOSCOPIC RETROGRADE CHOLANGIOPANCREATOGRAPHY (ERCP) WITH PROPOFOL N/A 09/10/2020   Procedure: ENDOSCOPIC RETROGRADE CHOLANGIOPANCREATOGRAPHY (ERCP) WITH PROPOFOL;  Surgeon: Meridee Score Netty Starring., MD;  Location: [facility] ENDOSCOPY;  Service: Gastroenterology;  Laterality: N/A;  . ERCP N/A 10/18/2019  Procedure: ENDOSCOPIC RETROGRADE CHOLANGIOPANCREATOGRAPHY (ERCP);  Surgeon: Rachael Fee, MD;  Location: [facility];  Service: Endoscopy;  Laterality: N/A;  . ESOPHAGOGASTRODUODENOSCOPY (EGD) WITH PROPOFOL N/A 12/17/2019   Procedure: ESOPHAGOGASTRODUODENOSCOPY (EGD) WITH PROPOFOL;  Surgeon: [name], MD;  Location: [facility] ENDOSCOPY;  Service: Endoscopy;   Laterality: N/A;  . KYPHOPLASTY N/A 01/03/2020   Procedure: KYPHOPLASTY LUMBAR TWO;  Surgeon: Venita Lick, MD;  Location: [facility] OR;  Service: Orthopedics;  Laterality: N/A;  . REMOVAL OF STONES  10/18/2019   Procedure: REMOVAL OF STONES;  Surgeon: Rachael Fee, MD;  Location: [facility] ENDOSCOPY;  Service: Endoscopy;;  . REMOVAL OF STONES  12/17/2019   Procedure: REMOVAL OF STONES;  Surgeon: [name], MD;  Location: [facility]OS[facility];  Service: Endoscopy;;  . REMOVAL OF STONES  02/07/2020   Procedure: REMOVAL OF STONES;  Surgeon: Lemar Lofty., MD;  Location: [facility] ENDOSCOPY;  Service: Gastroenterology;;  . REMOVAL OF STONES  09/10/2020   Procedure: REMOVAL OF STONES;  Surgeon: Lemar Lofty., MD;  Location: [facility] ENDOSCOPY;  Service: Gastroenterology;;  . Gaspar Bidding DILATION N/A 12/17/2019   Procedure: Gaspar Bidding DILATION;  Surgeon: [name], MD;  Location: [facility] ENDOSCOPY;  Service: Endoscopy;  Laterality: N/A;  . SPHINCTEROTOMY  10/18/2019   Procedure: SPHINCTEROTOMY;  Surgeon: Rachael Fee, MD;  Location: [facility]OS[facility];  Service: Endoscopy;;  . Dennison Mascot  12/17/2019   Procedure: Dennison Mascot;  Surgeon: [name], MD;  Location: [facility] ENDOSCOPY;  Service: Endoscopy;;  . Burman Freestone CHOLANGIOSCOPY N/A 02/07/2020   Procedure: MOLMBEML CHOLANGIOSCOPY;  Surgeon: Lemar Lofty., MD;  Location: [facility] ENDOSCOPY;  Service: Gastroenterology;  Laterality: N/A;  Burman Freestone LITHOTRIPSY N/A 02/07/2020   Procedure: JQGBEEFE LITHOTRIPSY;  Surgeon: Lemar Lofty., MD;  Location: [facility] ENDOSCOPY;  Service: Gastroenterology;  Laterality: N/A;  . STENT REMOVAL  12/17/2019   Procedure: STENT REMOVAL;  Surgeon: [name], MD;  Location: [facility] ENDOSCOPY;  Service: Endoscopy;;  . Francine Graven REMOVAL  02/07/2020   Procedure: STENT REMOVAL;  Surgeon: Lemar Lofty., MD;  Location: [facility] ENDOSCOPY;  Service: Gastroenterology;;  . Francine Graven REMOVAL  09/10/2020   Procedure: STENT REMOVAL;   Surgeon: Lemar Lofty., MD;  Location: [facility] ENDOSCOPY;  Service: Gastroenterology;;    Family History  Problem Relation Age of Onset  . Heart disease Mother   . Heart attack Mother   . Lung cancer Father   . Lymphoma Brother   . Heart failure Brother   . Esophageal cancer Paternal Uncle   . Colon cancer Neg Hx   . Colon polyps Neg Hx   . Rectal cancer Neg Hx   . Stomach cancer Neg Hx     Social History:  reports that she quit smoking about 6 years ago. Her smoking use included cigarettes. She has a 33.50 pack-year smoking history. She has never used smokeless tobacco. She reports current alcohol use of about 4.0 standard drinks of alcohol per week. She reports that she does not use drugs.  Allergies: No Active Allergies  Medications: I have reviewed the patient's current medications.  Results for orders placed or performed during the hospital encounter of 01/24/21 (from the past 48 hour(s))  CBC WITH DIFFERENTIAL     Status: Abnormal   Collection Time: 01/24/21 12:46 PM  Result Value Ref Range   WBC 12.4 (H) 4.0 - 10.5 K/uL   RBC 4.84 3.87 - 5.11 MIL/uL   Hemoglobin 16.2 (H) 12.0 - 15.0 g/dL   HCT 07.1 (H) 21.9 - 75.8 %  MCV 102.1 (H) 80.0 - 100.0 fL   MCH 33.5 26.0 - 34.0 pg   MCHC 32.8 30.0 - 36.0 g/dL   RDW 87.5 64.3 - 32.9 %   Platelets 206 150 - 400 K/uL   nRBC 0.0 0.0 - 0.2 %   Neutrophils Relative % 86 %   Neutro Abs 10.7 (H) 1.7 - 7.7 K/uL   Lymphocytes Relative 7 %   Lymphs Abs 0.9 0.7 - 4.0 K/uL   Monocytes Relative 5 %   Monocytes Absolute 0.6 0.1 - 1.0 K/uL   Eosinophils Relative 1 %   Eosinophils Absolute 0.1 0.0 - 0.5 K/uL   Basophils Relative 0 %   Basophils Absolute 0.0 0.0 - 0.1 K/uL   Immature Granulocytes 1 %   Abs Immature Granulocytes 0.10 (H) 0.00 - 0.07 K/uL    Comment: Performed at [facility], [address] [place], [place] [address]  Protime-INR     Status: Abnormal   Collection Time: 01/24/21 12:46 PM  Result Value Ref  Range   Prothrombin Time 17.5 (H) 11.4 - 15.2 seconds   INR 1.5 (H) 0.8 - 1.2    Comment: (NOTE) INR goal varies based on device and disease states. Performed at [facility], [address] [place], [place] [address]   Type and screen [facility]     Status: None (Preliminary result)   Collection Time: 01/24/21 12:46 PM  Result Value Ref Range   ABO/RH(D) O POS    Antibody Screen PENDING    Sample Expiration      01/27/2021,2359 Performed at [facility], [address] [place], [place] [address]     CT HEAD WO CONTRAST  Result Date: 01/24/2021 CLINICAL DATA:  Head trauma, fell on snow, on anticoagulants EXAM: CT HEAD WITHOUT CONTRAST TECHNIQUE: Contiguous axial images were obtained from the base of the skull through the vertex without intravenous contrast. Sagittal and coronal MPR images reconstructed from axial data set. COMPARISON:  07/23/2020 FINDINGS: Brain: Generalized atrophy. Normal ventricular morphology. No midline shift or mass effect. Small vessel chronic ischemic changes of deep cerebral white matter. Small old LEFT basal ganglia lacunar infarct. Old RIGHT cerebellar infarct. No intracranial hemorrhage, mass lesion, or evidence of acute infarction. No extra-axial fluid collections. Vascular: No hyperdense vessels. Atherosclerotic calcification of internal carotid arteries at skull base Skull: Intact Sinuses/Orbits: Clear Other: N/A IMPRESSION: Atrophy with small vessel chronic ischemic changes of deep cerebral white matter. 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 No acute intracranial abnormalities. Electronically Signed   By: Ulyses Southward M.D.   On: 01/24/2021 11:58   DG Hip Unilat  With Pelvis 2-3 Views Right  Result Date: 01/24/2021 CLINICAL DATA:  Right hip pain and deformity following a fall today. EXAM: DG HIP (WITH OR WITHOUT PELVIS) 2-3V RIGHT COMPARISON:  06/23/2020 FINDINGS: Interval acute, comminuted intertrochanteric  fracture on the right with varus angulation. The previously demonstrated superior trochanter fracture fragments are unchanged. Diffuse osteopenia. IMPRESSION: Acute, comminuted right intertrochanteric fracture with varus angulation. Electronically Signed   By: Beckie Salts M.D.   On: 01/24/2021 12:04    Review of Systems Blood pressure (!) 156/85, pulse 82, temperature 97.6 F (36.4 C), temperature source Oral, resp. rate 16, height 5\' 2"  (1.575 m), weight 57.2 kg, SpO2 97 %. Physical Exam AAO, neck non tender with normal pain free ROM. T and L spine nontender. Bilateral shoulders non tender with no swelling and pain free AROM. 5/5  motor strength bilateral UEs.  Right LE shortened and externally rotated, able to move the ankle without pain Left hip, knee and ankle all move well with no pain   Assessment/Plan: Displaced right IT hip fracture after mechanical slip and fall.  No other injuries detected.  Patient on Xarelto with last dose last night.  Will discuss with Dr Charlann Boxer to see if he would be able to perform surgery tomorrow. Will require IM nailing. Patient understands and agrees.  NPO after MN  Verlee Rossetti 01/24/2021, 1:38 PM

## 2021-01-24 NOTE — ED Notes (Signed)
Patient transported to CT 

## 2021-01-24 NOTE — ED Notes (Signed)
RN and EDP notified of critical I-Stat Chem 8 result.

## 2021-01-24 NOTE — ED Notes (Signed)
[place] sandwich bag and water given to pt. This RN helped get sandwich situated and got pt comfortable to where she could eat.

## 2021-01-24 NOTE — ED Notes (Signed)
Pt desatted after 50 mcg fentanyl to 82% RA. Placed on 2L [place], sats improved to 97%2L.

## 2021-01-24 NOTE — H&P (Signed)
History and Physical    PLEASE NOTE THAT DRAGON DICTATION SOFTWARE WAS USED IN THE CONSTRUCTION OF THIS NOTE.   Tenecia Ignasiak VZD:[id number] DOB: 16-Sep-1929 DOA: 01/24/2021  PCP: Mayer Masker, PA-C Patient coming from: home   I have personally briefly reviewed patient's old medical records in [facility] Link  Chief Complaint: Right hip pain  HPI: Carlicia Leavens is a 87 y.o. female with medical history significant for coronary artery disease status post PCI with stent placement to the RCA and LAD in 2006, paroxysmal atrial fibrillation chronically anticoagulated on Xarelto, acquired hypothyroidism, hypertension, stage IIIa chronic kidney disease with baseline creatinine 0.9 - 1.3 who is admitted to [facility] on 01/24/2021 with acute right intertrochanteric hip fracture after presenting from home to [facility] Emergency Department complaining of right hip pain.   The following history is provided by the patient as well as her daughter, who is present at bedside, in addition to my discussions with the emergency department physician and via chart review.  The patient reports that he experienced a fall earlier this morning while attempting to ambulate outside to pick up a newspaper, at which time she reports that she slipped on the snow falling forward in spite of using her home walker.  She reports that her right hip was the principal point of contact with the concrete driveway, and reports immediate development of sharp right hip discomfort.  She reports that the right hip pain is been constant in nature since this event, worsening when attempting to put weight on the right lower extremity, and conveys radiation of this discomfort into the right groin.  She denies any associated loss of consciousness, does not believe that she hit her head as a component of this fall.  Denies any ensuing headache, neck pain, or change in vision.  Also denies any associated dizziness or presyncope.  Denies  any preceding or associated chest pain, shortness of breath, diaphoresis, palpitations, nausea.  Aside from the right hip discomfort, she is without current complain, and denies any additional arthralgias or myalgias at this time.  Denies any numbness or paresthesias associated with the right lower extremity.  Medical history is notable for a history of coronary disease status post PCI with stent placement to the RCA/LAD in 2006.  Subsequently, she underwent nuclear stress test in January 2015, which reportedly showed no evidence of reversible ischemia.  Most recent echocardiogram occurred in August 2020 and showed normal left ventricular cavity size, LVEF 55 to 60%, severely dilated left atrium, and mild to moderate tricuspid regurgitation.  She denies any recent shortness of breath, orthopnea, PND, or worsening of peripheral edema.  She also has a history of paroxysmal atrial fibrillation, for which she is chronically anticoagulated on Xarelto.  She reports, and her daughter confirms, that the patient most recently took Xarelto on the evening of 01/23/2021.  Aside from Xarelto, the patient is on no blood thinning agents as an outpatient, including no aspirin.   Denies any recent subjective fever, chills, rigors, or generalized myalgias. Denies any recent rhinitis, rhinorrhea, sore throat, wheezing, cough, abdominal pain, diarrhea, or rash. No recent traveling or known COVID-19 exposures. Denies dysuria, gross hematuria, or change in urinary urgency/frequency.      ED Course:  Vital signs in the ED were notable for the following: Temperature max 97.6; heart rate 77-82; blood pressure 152/97 - 161/92; respiratory rate 16-19; oxygen saturation 9620% on room air.  Labs were notable for the following: BMP was notable for the following: Sodium  138, bicarbonate 22, creatinine 0.97, glucose 110.  CBC was notable for the following: White blood cell count of 12,400.  Screening nasopharyngeal COVID-19 PCR  performed in the ED today was found to be negative.  Plain films of the right hip showed evidence of acute comminuted right intertrochanteric hip fracture with varus angulation.  EKG, by way of comparison to most recent prior performed on 01/06/2021, showed atrial fibrillation with ventricular rate 78, no evidence of T wave or ST changes, including no evidence of ST elevation, and showed potential Q waves in V1 and V2, which were also noted on most recent prior EKG.  The patient's case and imaging were discussed with the on-call orthopedic surgeon, Dr. Ranell Patrick, recommended admission to the hospital service for further evaluation management presenting acute right intertrochanteric hip fracture, with plan for definitive surgical management to occur tomorrow, 01/25/2021, with plan for intramedullary nailing at that time.  Dr. Devonne Doughty recommended holding of home Xarelto, and asked the patient be made n.p.o. at midnight.  While in the ED, the following were administered: Fentanyl 50 mcg IV x3.     Review of Systems: As per HPI otherwise 10 point review of systems negative.   Past Medical History:  Diagnosis Date  . Acute bronchitis 10/2017  . Arthritis   . CKD (chronic kidney disease)    Xarelto dose is 15 mg QD  . Coronary artery disease    a. s/p PCI/stenting to RCA and LAD ([facility] in [place]) in 2006; b. Nuc 1/15: No ischemia, EF 70%  . Depression   . Gallstones   . Gastritis   . GERD (gastroesophageal reflux disease)   . Hiatal hernia   . Hip fracture (HCC)   . History of cholecystectomy   . History of echocardiogram    a. Echo 1/15: Moderate LVH, EF 55-60%, impaired relaxation, mild AS  . Hyperlipidemia   . Hypertension   . Hypokalemia   . Hypothyroidism   . Mobitz type 1 second degree atrioventricular block    a. Event Monitor 3/15: NSR, first-degree AV block, second-degree AB block - Mobitz 1, PACs, NSVT (5 beats)  . PAF (paroxysmal atrial fibrillation) (HCC)   . Thyroid  disease     Past Surgical History:  Procedure Laterality Date  . ANGIOPLASTY  2006   with sent placement  . BACK SURGERY     Dr Shon Baton  . BILIARY DILATION  10/18/2019   Procedure: BILIARY DILATION;  Surgeon: Rachael Fee, MD;  Location: [facility] ENDOSCOPY;  Service: Endoscopy;;  . BILIARY DILATION  12/17/2019   Procedure: BILIARY DILATION;  Surgeon: [name], MD;  Location: [facility] ENDOSCOPY;  Service: Endoscopy;;  . BILIARY DILATION  02/07/2020   Procedure: BILIARY DILATION;  Surgeon: Lemar Lofty., MD;  Location: [facility] ENDOSCOPY;  Service: Gastroenterology;;  . BILIARY DILATION  09/10/2020   Procedure: BILIARY DILATION;  Surgeon: Lemar Lofty., MD;  Location: [facility] ENDOSCOPY;  Service: Gastroenterology;;  . BILIARY STENT PLACEMENT N/A 10/18/2019   Procedure: BILIARY STENT PLACEMENT;  Surgeon: Rachael Fee, MD;  Location: [facility] ENDOSCOPY;  Service: Endoscopy;  Laterality: N/A;  . BILIARY STENT PLACEMENT N/A 12/17/2019   Procedure: BILIARY STENT PLACEMENT;  Surgeon: [name], MD;  Location: [facility] ENDOSCOPY;  Service: Endoscopy;  Laterality: N/A;  . BILIARY STENT PLACEMENT  02/07/2020   Procedure: BILIARY STENT PLACEMENT;  Surgeon: Meridee Score Netty Starring., MD;  Location: [facility] ENDOSCOPY;  Service: Gastroenterology;;  . BLEPHAROPLASTY    . BREAST LUMPECTOMY Left   .  CARDIAC CATHETERIZATION  2006  . CATARACT EXTRACTION Bilateral   . CHOLECYSTECTOMY    . DILATION AND CURETTAGE OF UTERUS    . ENDOSCOPIC RETROGRADE CHOLANGIOPANCREATOGRAPHY (ERCP) WITH PROPOFOL N/A 12/17/2019   Procedure: ENDOSCOPIC RETROGRADE CHOLANGIOPANCREATOGRAPHY (ERCP) WITH PROPOFOL;  Surgeon: [name], MD;  Location: [facility] ENDOSCOPY;  Service: Endoscopy;  Laterality: N/A;  . ENDOSCOPIC RETROGRADE CHOLANGIOPANCREATOGRAPHY (ERCP) WITH PROPOFOL N/A 02/07/2020   Procedure: ENDOSCOPIC RETROGRADE CHOLANGIOPANCREATOGRAPHY (ERCP) WITH PROPOFOL;  Surgeon: Meridee Score Netty Starring., MD;  Location: [facility] ENDOSCOPY;   Service: Gastroenterology;  Laterality: N/A;  . ENDOSCOPIC RETROGRADE CHOLANGIOPANCREATOGRAPHY (ERCP) WITH PROPOFOL N/A 09/10/2020   Procedure: ENDOSCOPIC RETROGRADE CHOLANGIOPANCREATOGRAPHY (ERCP) WITH PROPOFOL;  Surgeon: Meridee Score Netty Starring., MD;  Location: [facility] ENDOSCOPY;  Service: Gastroenterology;  Laterality: N/A;  . ERCP N/A 10/18/2019   Procedure: ENDOSCOPIC RETROGRADE CHOLANGIOPANCREATOGRAPHY (ERCP);  Surgeon: Rachael Fee, MD;  Location: [facility] ENDOSCOPY;  Service: Endoscopy;  Laterality: N/A;  . ESOPHAGOGASTRODUODENOSCOPY (EGD) WITH PROPOFOL N/A 12/17/2019   Procedure: ESOPHAGOGASTRODUODENOSCOPY (EGD) WITH PROPOFOL;  Surgeon: [name], MD;  Location: [facility] ENDOSCOPY;  Service: Endoscopy;  Laterality: N/A;  . KYPHOPLASTY N/A 01/03/2020   Procedure: KYPHOPLASTY LUMBAR TWO;  Surgeon: Venita Lick, MD;  Location: [facility] OR;  Service: Orthopedics;  Laterality: N/A;  . REMOVAL OF STONES  10/18/2019   Procedure: REMOVAL OF STONES;  Surgeon: Rachael Fee, MD;  Location: [facility] ENDOSCOPY;  Service: Endoscopy;;  . REMOVAL OF STONES  12/17/2019   Procedure: REMOVAL OF STONES;  Surgeon: [name], MD;  Location: [facility] ENDOSCOPY;  Service: Endoscopy;;  . REMOVAL OF STONES  02/07/2020   Procedure: REMOVAL OF STONES;  Surgeon: Lemar Lofty., MD;  Location: [facility] ENDOSCOPY;  Service: Gastroenterology;;  . REMOVAL OF STONES  09/10/2020   Procedure: REMOVAL OF STONES;  Surgeon: Lemar Lofty., MD;  Location: [facility] ENDOSCOPY;  Service: Gastroenterology;;  . Gaspar Bidding DILATION N/A 12/17/2019   Procedure: Gaspar Bidding DILATION;  Surgeon: [name], MD;  Location: [facility] ENDOSCOPY;  Service: Endoscopy;  Laterality: N/A;  . SPHINCTEROTOMY  10/18/2019   Procedure: SPHINCTEROTOMY;  Surgeon: Rachael Fee, MD;  Location: [facility] ENDOSCOPY;  Service: Endoscopy;;  . Dennison Mascot  12/17/2019   Procedure: Dennison Mascot;  Surgeon: [name], MD;  Location: [facility] ENDOSCOPY;  Service: Endoscopy;;  .  Burman Freestone CHOLANGIOSCOPY N/A 02/07/2020   Procedure: ZOXWRUEA CHOLANGIOSCOPY;  Surgeon: Lemar Lofty., MD;  Location: [facility] ENDOSCOPY;  Service: Gastroenterology;  Laterality: N/A;  Burman Freestone LITHOTRIPSY N/A 02/07/2020   Procedure: [id number] LITHOTRIPSY;  Surgeon: Lemar Lofty., MD;  Location: [facility] ENDOSCOPY;  Service: Gastroenterology;  Laterality: N/A;  . STENT REMOVAL  12/17/2019   Procedure: STENT REMOVAL;  Surgeon: [name], MD;  Location: [facility] ENDOSCOPY;  Service: Endoscopy;;  . Francine Graven REMOVAL  02/07/2020   Procedure: STENT REMOVAL;  Surgeon: Lemar Lofty., MD;  Location: [facility] ENDOSCOPY;  Service: Gastroenterology;;  . Francine Graven REMOVAL  09/10/2020   Procedure: STENT REMOVAL;  Surgeon: Lemar Lofty., MD;  Location: [facility] ENDOSCOPY;  Service: Gastroenterology;;    Social History:  reports that she quit smoking about 6 years ago. Her smoking use included cigarettes. She has a 33.50 pack-year smoking history. She has never used smokeless tobacco. She reports current alcohol use of about 4.0 standard drinks of alcohol per week. She reports that she does not use drugs.   No Known Allergies  Family History  Problem Relation Age of Onset  . Heart disease Mother   . Heart attack Mother   .  Lung cancer Father   . Lymphoma Brother   . Heart failure Brother   . Esophageal cancer Paternal Uncle   . Colon cancer Neg Hx   . Colon polyps Neg Hx   . Rectal cancer Neg Hx   . Stomach cancer Neg Hx     Prior to Admission medications   Medication Sig Start Date End Date Taking? Authorizing Provider  acetaminophen (TYLENOL) 325 MG tablet Take 650 mg by mouth every 6 (six) hours as needed for moderate pain (for pain.).    Yes [provider]  amLODipine (NORVASC) 5 MG tablet TAKE 1 TABLET BY MOUTH DAILY. Patient taking differently: Take 5 mg by mouth daily. 04/18/20  Yes Jake Bathe, MD  antiseptic oral rinse (BIOTENE) LIQD 15 mLs by Mouth Rinse route daily.    Yes [provider]  buPROPion (WELLBUTRIN XL) 150 MG 24 hr tablet Take 1 tablet (150 mg total) by mouth daily. 07/11/20  Yes Abonza, Maritza, PA-C  CALCIUM-MAGNESIUM-ZINC PO Take 1 tablet by mouth daily.    Yes [provider]  fesoterodine (TOVIAZ) 4 MG TB24 tablet Take 4 mg by mouth daily.   Yes [provider]  folic acid (FOLVITE) 1 MG tablet Take 1 tablet (1 mg total) by mouth daily. 01/07/21  Yes Abonza, Kandis Cocking, PA-C  levothyroxine (SYNTHROID) 75 MCG tablet Take 1 tablet (75 mcg total) by mouth daily before breakfast. **PATIENT NEEDS LAB FOR FURTHER REFILLS** 12/01/20  Yes Abonza, Maritza, PA-C  lidocaine (LIDODERM) 5 % Place 1 patch onto the skin daily. Remove & Discard patch within 12 hours or as directed by MD   Yes [provider]  metoprolol tartrate (LOPRESSOR) 25 MG tablet Take 0.5 tablets (12.5 mg total) by mouth 2 (two) times daily. **NEEDS APT FOR FURTHER MED REFILLS** 12/01/20  Yes Abonza, Maritza, PA-C  omeprazole (PRILOSEC) 20 MG capsule TAKE 1 CAPSULE BY MOUTH DAILY AS NEEDED FOR HEARTBURN. Patient taking differently: Take 20 mg by mouth daily as needed (heartburn). 09/04/19  Yes Danford, Orpha Bur D, NP  PARoxetine (PAXIL) 20 MG tablet Take 1 tab by mouth daily for 2 weeks then increase to 2 tabs by mouth daily Patient taking differently: Take 40 mg by mouth daily. 01/01/21  Yes Abonza, Maritza, PA-C  potassium chloride SA (KLOR-CON) 20 MEQ tablet Take 1 tablet (20 mEq total) by mouth daily. Please keep upcoming schedule for future refill Patient taking differently: Take 20 mEq by mouth daily. 12/17/20  Yes Jake Bathe, MD  Propylene Glycol 0.6 % SOLN Place 1 drop into both eyes daily.   Yes [provider]  quinapril (ACCUPRIL) 40 MG tablet Take 1 tablet (40 mg total) by mouth daily. 07/11/20  Yes Abonza, Maritza, PA-C  simvastatin (ZOCOR) 5 MG tablet TAKE 1 TABLET BY MOUTH EVERY EVENING. Patient taking differently: Take 5 mg by mouth every  evening. 08/07/20  Yes Abonza, Maritza, PA-C  spironolactone (ALDACTONE) 25 MG tablet TAKE 1/2 TABLET BY MOUTH DAILY Patient taking differently: Take 12.5 mg by mouth daily. 01/07/21  Yes Jake Bathe, MD  VITAMIN D PO Take 1 tablet by mouth at bedtime.   Yes [provider]  XARELTO 15 MG TABS tablet TAKE 1 TABLET BY MOUTH DAILY WITH SUPPER. Patient taking differently: Take 15 mg by mouth daily with supper. 12/22/20  Yes Jake Bathe, MD     Objective    Physical Exam: Vitals:   01/24/21 1613 01/24/21 1716 01/24/21 1800 01/24/21 1819  BP: Marland Kitchen)  151/87  (!) 164/94 (!) 156/92  Pulse: 73  72 76  Resp: 20  20 17   Temp:  97.6 F (36.4 C)    TempSrc:  Oral    SpO2: 99%  100% 100%  Weight:      Height:        General: appears to be stated age; alert, oriented Skin: warm, dry, no rash Head:  AT/[place] Mouth:  Oral mucosa membranes appear moist, normal dentition Neck: supple; trachea midline Heart:  Irregular, but rate controlled; did not appreciate any M/R/G Lungs: CTAB, did not appreciate any wheezes, rales, or rhonchi Abdomen: + BS; soft, ND, NT Vascular: 2+ pedal pulses b/l; 2+ radial pulses b/l Extremities: traction noted to be associated with RLE; no peripheral edema, no muscle wasting Neuro: Assessment of strength in the right lower extremity is limited by current degree of pain control; sensation intact in upper and lower extremities bilaterally.    Labs on Admission: I have personally reviewed following labs and imaging studies  CBC: Recent Labs  Lab 01/24/21 1246  WBC 12.4*  NEUTROABS 10.7*  HGB 16.2*  HCT 49.4*  MCV 102.1*  PLT 206   Basic Metabolic Panel: Recent Labs  Lab 01/24/21 1554  NA 138  K 4.4  CL 105  CO2 22  GLUCOSE 110*  BUN 26*  CREATININE 0.97  CALCIUM 9.2   GFR: Estimated Creatinine Clearance: 31.1 mL/min (by C-G formula based on SCr of 0.97 mg/dL). Liver Function Tests: No results for input(s): AST, ALT, ALKPHOS, BILITOT,  PROT, ALBUMIN in the last 168 hours. No results for input(s): LIPASE, AMYLASE in the last 168 hours. No results for input(s): AMMONIA in the last 168 hours. Coagulation Profile: Recent Labs  Lab 01/24/21 1246  INR 1.5*   Cardiac Enzymes: No results for input(s): CKTOTAL, CKMB, CKMBINDEX, TROPONINI in the last 168 hours. BNP (last 3 results) No results for input(s): PROBNP in the last 8760 hours. HbA1C: No results for input(s): HGBA1C in the last 72 hours. CBG: No results for input(s): GLUCAP in the last 168 hours. Lipid Profile: No results for input(s): CHOL, HDL, LDLCALC, TRIG, CHOLHDL, LDLDIRECT in the last 72 hours. Thyroid Function Tests: No results for input(s): TSH, T4TOTAL, FREET4, T3FREE, THYROIDAB in the last 72 hours. Anemia Panel: No results for input(s): VITAMINB12, FOLATE, FERRITIN, TIBC, IRON, RETICCTPCT in the last 72 hours. Urine analysis:    Component Value Date/Time   COLORURINE YELLOW 07/23/2020 1748   APPEARANCEUR CLOUDY (A) 07/23/2020 1748   LABSPEC >1.030 (H) 07/23/2020 1748   PHURINE 5.5 07/23/2020 1748   GLUCOSEU NEGATIVE 07/23/2020 1748   HGBUR NEGATIVE 07/23/2020 1748   BILIRUBINUR negative 08/02/2020 1543   BILIRUBINUR negative 09/27/2019 1041   KETONESUR trace (5) (A) 08/02/2020 1543   KETONESUR NEGATIVE 07/23/2020 1748   PROTEINUR trace (A) 08/02/2020 1543   PROTEINUR NEGATIVE 07/23/2020 1748   UROBILINOGEN 0.2 08/02/2020 1543   UROBILINOGEN 1.0 04/12/2017 1157   NITRITE Negative 08/02/2020 1543   NITRITE NEGATIVE 07/23/2020 1748   LEUKOCYTESUR Small (1+) (A) 08/02/2020 1543   LEUKOCYTESUR TRACE (A) 07/23/2020 1748    Radiological Exams on Admission: CT HEAD WO CONTRAST  Result Date: 01/24/2021 CLINICAL DATA:  Head trauma, fell on snow, on anticoagulants EXAM: CT HEAD WITHOUT CONTRAST TECHNIQUE: Contiguous axial images were obtained from the base of the skull through the vertex without intravenous contrast. Sagittal and coronal MPR images  reconstructed from axial data set. COMPARISON:  07/23/2020 FINDINGS: Brain: Generalized atrophy. Normal ventricular morphology. No  midline shift or mass effect. Small vessel chronic ischemic changes of deep cerebral white matter. Small old LEFT basal ganglia lacunar infarct. Old RIGHT cerebellar infarct. No intracranial hemorrhage, mass lesion, or evidence of acute infarction. No extra-axial fluid collections. Vascular: No hyperdense vessels. Atherosclerotic calcification of internal carotid arteries at skull base Skull: Intact Sinuses/Orbits: Clear Other: N/A IMPRESSION: Atrophy with small vessel chronic ischemic changes of deep cerebral white matter. 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 No acute intracranial abnormalities. Electronically Signed   By: Ulyses SouthwardMark  Boles M.D.   On: 01/24/2021 11:58   DG Hip Unilat  With Pelvis 2-3 Views Right  Result Date: 01/24/2021 CLINICAL DATA:  Right hip pain and deformity following a fall today. EXAM: DG HIP (WITH OR WITHOUT PELVIS) 2-3V RIGHT COMPARISON:  06/23/2020 FINDINGS: Interval acute, comminuted intertrochanteric fracture on the right with varus angulation. The previously demonstrated superior trochanter fracture fragments are unchanged. Diffuse osteopenia. IMPRESSION: Acute, comminuted right intertrochanteric fracture with varus angulation. Electronically Signed   By: Beckie SaltsSteven  Reid M.D.   On: 01/24/2021 12:04     EKG: Independently reviewed, with result as described above.    Assessment/Plan   Sherrie SportLita Tenorio is a 87 y.o. female with medical history significant for coronary artery disease status post PCI with stent placement to the RCA and LAD in 2006, paroxysmal atrial fibrillation chronically anticoagulated on Xarelto, acquired hypothyroidism, hypertension, stage IIIa chronic kidney disease with baseline creatinine 0.9 - 1.3 who is [facility] on 01/24/2021 with acute right intertrochanteric hip fracture after presenting  from home to [facility] Emergency Department complaining of right hip pain.    Principal Problem:   Hip fx, right, closed, initial encounter [facility](HCC) Active Problems:   Chronic anticoagulation   Hypertension   Paroxysmal atrial fibrillation (HCC)   Right hip pain   Leukocytosis   Depression    #) Acute right intertrochanteric hip fracture: The patient reports with acute right hip pain radiating into the right groin, with plan films of the right hip showing evidence of acute comminuted right intertrochanteric hip fracture with varus angulation.  This is stemming from a reported ground-level mechanical fall without associated loss of consciousness that occurred earlier on the day of admission, in the absence of the patient hitting her head, as further described above.  Right lower extremity appears neurovascularly intact, the patient reports adequate pain control at this time.  She is on daily Xarelto for thromboembolic prophylaxis in the setting of a history of atrial fibrillation, with most recent dose occurring on evening of 01/23/2021.  Otherwise she is on no blood thinning agents as an outpatient, including no aspirin.  Presenting EKG shows no evidence of acute ischemic changes, as further detailed above.  The patient's case and imaging were discussed with the on-call orthopedic surgeon, Dr. Ranell PatrickNorris, recommended admission to the hospital service for further evaluation management presenting acute right intertrochanteric hip fracture, with plan for definitive surgical management to occur tomorrow, 01/25/2021, with plan for intramedullary nailing at that time.  Dr. Devonne DoughtyNoris recommended holding of home Xarelto, and asked the patient be made n.p.o. at midnight.   Gupta Score for this patient in the context of anticipated aforementioned orthopedic surgery conveys a 4.75% perioperative risk for significant cardiac event. No evidence to suggest acutely decompensated heart failure or acute MI. Consequently, no absolute  contraindications to proceeding with proposed orthopedic surgery at this time.    Plan: Formal orthopedic surgery consult for definitive surgical management, with plan for intermedullary nailing  tomorrow (01/25/2021), as further described above.  N.p.o. after midnight, per orthopedic surgery recommendation.  Holding home Xarelto in anticipation of surgery.  SCDs.  As needed IV fentanyl.  Check INR.  Anticipate postoperative PT consult.  Check 25 hydroxy vitamin D level.    #) Ground level mechanical fall: The patient reports a mechanical fall in which she slipped while attempting to ambulate in the snow to pick up her newspaper this morning, as further detailed above.  Patient reports that she did not hit her head as a component as well, and denies any associated loss of consciousness.  Resulted in the acute right hip fracture, as further described above.  Does not appear associated with any focal neurologic deficits, although unable to fully assess strength in the right lower extremity in the setting of limitations posed by pain control associated with presenting right hip fracture.  While this fall appears to be purely mechanical in nature, will also check urinalysis to rule out any underlying infectious contribution.  Of note, the patient lives independently, and at baseline ambulates with the use of a walker, which she states she was using at the time of the above fall.   Plan: Check urinalysis, as above.  Repeat BMP and CBC with differential in the morning.       #) Leukocytosis: Presenting CBC reflects mildly elevated white cell count of 12,400.  Suspect that this is reactive in nature in the setting of presenting ground-level mechanical fall as well as associated acute right hip fracture.  No evidence of underlying infectious process at this time, including nasopharyngeal COVID-19 PCR performed in the ED today found to be negative.  Will check urinalysis to further evaluate for any underlying  infectious contribution.  Plan: Check urinalysis, as above.  Repeat CBC with differential in the morning.     #) Paroxysmal atrial fibrillation: Documented history of such. In the setting of a CHA2DS2-VASc score of 5, there is an indication for the patient to be on chronic anticoagulation for thromboembolic prophylaxis. Consistent with this, the patient is chronically anticoagulated on Xarelto. Home AV nodal blocking regimen: Lopressor.  Most recent echocardiogram was performed in August 2020, with findings notable for severely dilated left atrium, and additional findings, as detailed above.. Presenting EKG shows rate controlled atrial fibrillation, without evidence of acute ischemic changes.   Plan: monitor strict I's & O's and daily weights. Repeat BMP in the morning. Check serum magnesium level.  I have ordered resumption of home beta-blocker for the morning of 01/26/2021 for associated postoperative mortality benefit.  Hold home Xarelto for now in the setting of anticipated orthopedic surgery tomorrow.     #) Essential hypertension: Outpatient antihypertensive regimen includes Norvasc, Lopressor, quinapril, and spironolactone.   Plan: We will hold home antihypertensive medications for now in the setting of n.p.o. status in preparation for anticipated orthopedic surgery tomorrow.  As noted above, have placed an order for resumption of beta-blocker postoperatively for associated mortality benefit, as detailed above.  Close monitoring of ensuing blood pressure via routine vital signs.       #) Acquired hypothyroidism: On Synthroid supplementation as an outpatient.  Of note, most recent TSH was found to be within normal limits when checked in November 2021.   Plan: We will plan for postoperative resumption of home Synthroid.     #) Stage III chronic kidney disease: Associated with baseline creatinine of 0.9-1.3.  Presenting BMP demonstrates creatinine within this baseline  range.  Plan: Monitor strict I's and O's  and daily weights.  Attempt avoid nephrotoxic agents.  Repeat BMP in the morning.     #) Depression: On Wellbutrin as well as Paxil as an outpatient.  Plan: We will hold home Paxil Wellbutrin for now in the setting of current n.p.o. status in anticipation of tomorrow's orthopedic surgical procedure, with plan for resumption in the postoperative period.     DVT prophylaxis: SCDs Code Status: Full code Family Communication: Discussed the patient's case with her daughter, who was present at bedside. Disposition Plan: Per Rounding Team Consults called: Case and imaging were discussed with the on-call orthopedic surgeon, Dr. Ranell Patrick, as further described above. Admission status: Inpatient; MedSurg.  COVID-19 Vaccination status: Received 2 doses of the Pfizer vaccine in January and February 2021, respectively,    Of note, this patient was added by me to the following Admit List/Treatment Team:  mcadmits    PLEASE NOTE THAT DRAGON DICTATION SOFTWARE WAS USED IN THE CONSTRUCTION OF THIS NOTE.   Angie Fava DO [facility] Pager [phone] From 12PM- 8PM  Otherwise, please contact night-coverage  [email or web address] Password [facility]  01/24/2021, 6:41 PM

## 2021-01-25 ENCOUNTER — Inpatient Hospital Stay (HOSPITAL_COMMUNITY): Payer: Medicare PPO

## 2021-01-25 ENCOUNTER — Inpatient Hospital Stay (HOSPITAL_COMMUNITY): Payer: Medicare PPO | Admitting: Anesthesiology

## 2021-01-25 ENCOUNTER — Encounter (HOSPITAL_COMMUNITY): Payer: Self-pay | Admitting: Internal Medicine

## 2021-01-25 ENCOUNTER — Encounter (HOSPITAL_COMMUNITY)
Admission: EM | Disposition: A | Discharge status: Skilled Nursing Facility | Payer: Self-pay | Source: Home / Self Care | Attending: Internal Medicine

## 2021-01-25 DIAGNOSIS — S72001A Fracture of unspecified part of neck of right femur, initial encounter for closed fracture: Secondary | ICD-10-CM | POA: Diagnosis not present

## 2021-01-25 HISTORY — PX: INTRAMEDULLARY (IM) NAIL INTERTROCHANTERIC: SHX5875

## 2021-01-25 LAB — CBC WITH DIFFERENTIAL/PLATELET
Abs Immature Granulocytes: 0.04 10*3/uL (ref 0.00–0.07)
Basophils Absolute: 0 10*3/uL (ref 0.0–0.1)
Basophils Relative: 0 %
Eosinophils Absolute: 0 10*3/uL (ref 0.0–0.5)
Eosinophils Relative: 0 %
HCT: 48.7 % — ABNORMAL HIGH (ref 36.0–46.0)
Hemoglobin: 16.4 g/dL — ABNORMAL HIGH (ref 12.0–15.0)
Immature Granulocytes: 0 %
Lymphocytes Relative: 8 %
Lymphs Abs: 0.8 10*3/uL (ref 0.7–4.0)
MCH: 33.7 pg (ref 26.0–34.0)
MCHC: 33.7 g/dL (ref 30.0–36.0)
MCV: 100 fL (ref 80.0–100.0)
Monocytes Absolute: 0.8 10*3/uL (ref 0.1–1.0)
Monocytes Relative: 8 %
Neutro Abs: 8.2 10*3/uL — ABNORMAL HIGH (ref 1.7–7.7)
Neutrophils Relative %: 84 %
Platelets: 211 10*3/uL (ref 150–400)
RBC: 4.87 MIL/uL (ref 3.87–5.11)
RDW: 12.8 % (ref 11.5–15.5)
WBC: 9.8 10*3/uL (ref 4.0–10.5)
nRBC: 0 % (ref 0.0–0.2)

## 2021-01-25 LAB — BASIC METABOLIC PANEL
Anion gap: 11 (ref 5–15)
BUN: 28 mg/dL — ABNORMAL HIGH (ref 8–23)
CO2: 24 mmol/L (ref 22–32)
Calcium: 9.4 mg/dL (ref 8.9–10.3)
Chloride: 105 mmol/L (ref 98–111)
Creatinine, Ser: 1.14 mg/dL — ABNORMAL HIGH (ref 0.44–1.00)
GFR, Estimated: 46 mL/min — ABNORMAL LOW (ref >60)
Glucose, Bld: 134 mg/dL — ABNORMAL HIGH (ref 70–99)
Potassium: 4.3 mmol/L (ref 3.5–5.1)
Sodium: 140 mmol/L (ref 135–145)

## 2021-01-25 LAB — URINALYSIS, ROUTINE W REFLEX MICROSCOPIC
Bacteria, UA: NONE SEEN
Bilirubin Urine: NEGATIVE
Glucose, UA: NEGATIVE mg/dL
Hgb urine dipstick: NEGATIVE
Ketones, ur: NEGATIVE mg/dL
Leukocytes,Ua: NEGATIVE
Nitrite: NEGATIVE
Protein, ur: 100 mg/dL — AB
Specific Gravity, Urine: 1.023 (ref 1.005–1.030)
pH: 5 (ref 5.0–8.0)

## 2021-01-25 LAB — PROTIME-INR
INR: 1.4 — ABNORMAL HIGH (ref 0.8–1.2)
Prothrombin Time: 16.5 seconds — ABNORMAL HIGH (ref 11.4–15.2)

## 2021-01-25 LAB — MAGNESIUM: Magnesium: 2.1 mg/dL (ref 1.7–2.4)

## 2021-01-25 LAB — VITAMIN D 25 HYDROXY (VIT D DEFICIENCY, FRACTURES): Vit D, 25-Hydroxy: 30.15 ng/mL (ref 30–100)

## 2021-01-25 SURGERY — FIXATION, FRACTURE, INTERTROCHANTERIC, WITH INTRAMEDULLARY ROD
Anesthesia: General | Site: Hip | Laterality: Right

## 2021-01-25 MED ORDER — PHENOL 1.4 % MT LIQD: 1.0000 | OROMUCOSAL | Status: DC | PRN

## 2021-01-25 MED ORDER — SUGAMMADEX SODIUM 200 MG/2ML IV SOLN
INTRAVENOUS | Status: DC | PRN
  Administered 2021-01-25: 120 mg via INTRAVENOUS

## 2021-01-25 MED ORDER — PAROXETINE HCL 20 MG PO TABS
40.0000 mg | ORAL_TABLET | Freq: Every day | ORAL | Status: DC
  Administered 2021-01-26 – 2021-01-30 (×5): 40 mg via ORAL
  Filled 2021-01-25 (×5): qty 2

## 2021-01-25 MED ORDER — DEXAMETHASONE SODIUM PHOSPHATE 10 MG/ML IJ SOLN
INTRAMUSCULAR | Status: DC | PRN
  Administered 2021-01-25: 10 mg via INTRAVENOUS

## 2021-01-25 MED ORDER — ACETAMINOPHEN 325 MG PO TABS: 325.0000 mg | ORAL_TABLET | Freq: Four times a day (QID) | ORAL | Status: DC | PRN

## 2021-01-25 MED ORDER — SIMVASTATIN 5 MG PO TABS
5.0000 mg | ORAL_TABLET | Freq: Every evening | ORAL | Status: DC
  Administered 2021-01-25 – 2021-01-30 (×6): 5 mg via ORAL
  Filled 2021-01-25 (×6): qty 1

## 2021-01-25 MED ORDER — ONDANSETRON HCL 4 MG/2ML IJ SOLN: 4.0000 mg | Freq: Four times a day (QID) | INTRAMUSCULAR | Status: DC | PRN

## 2021-01-25 MED ORDER — PROPOFOL 10 MG/ML IV BOLUS
INTRAVENOUS | Status: AC
  Filled 2021-01-25: qty 20

## 2021-01-25 MED ORDER — ACETAMINOPHEN 10 MG/ML IV SOLN
INTRAVENOUS | Status: DC | PRN
  Administered 2021-01-25: 1000 mg via INTRAVENOUS

## 2021-01-25 MED ORDER — PHENYLEPHRINE HCL-NACL 10-0.9 MG/250ML-% IV SOLN
INTRAVENOUS | Status: DC | PRN
  Administered 2021-01-25: 25 ug/min via INTRAVENOUS

## 2021-01-25 MED ORDER — ONDANSETRON HCL 4 MG/2ML IJ SOLN
INTRAMUSCULAR | Status: DC | PRN
  Administered 2021-01-25: 4 mg via INTRAVENOUS

## 2021-01-25 MED ORDER — LIDOCAINE 2% (20 MG/ML) 5 ML SYRINGE
INTRAMUSCULAR | Status: DC | PRN
  Administered 2021-01-25: 20 mg via INTRAVENOUS

## 2021-01-25 MED ORDER — PROPOFOL 10 MG/ML IV BOLUS
INTRAVENOUS | Status: DC | PRN
  Administered 2021-01-25: 50 mg via INTRAVENOUS
  Administered 2021-01-25: 20 mg via INTRAVENOUS

## 2021-01-25 MED ORDER — ACETAMINOPHEN 325 MG PO TABS
650.0000 mg | ORAL_TABLET | Freq: Four times a day (QID) | ORAL | Status: DC | PRN
  Administered 2021-01-26 – 2021-01-27 (×3): 650 mg via ORAL
  Filled 2021-01-25 (×3): qty 2

## 2021-01-25 MED ORDER — DOCUSATE SODIUM 100 MG PO CAPS
100.0000 mg | ORAL_CAPSULE | Freq: Two times a day (BID) | ORAL | Status: DC
  Administered 2021-01-25 – 2021-01-30 (×11): 100 mg via ORAL
  Filled 2021-01-25 (×11): qty 1

## 2021-01-25 MED ORDER — METOPROLOL TARTRATE 12.5 MG HALF TABLET
12.5000 mg | ORAL_TABLET | Freq: Two times a day (BID) | ORAL | Status: DC
  Administered 2021-01-25 – 2021-01-30 (×11): 12.5 mg via ORAL
  Filled 2021-01-25 (×11): qty 1

## 2021-01-25 MED ORDER — BUPROPION HCL ER (XL) 150 MG PO TB24
150.0000 mg | ORAL_TABLET | Freq: Every day | ORAL | Status: DC
Start: 2021-01-26 — End: 2021-01-30
  Administered 2021-01-26 – 2021-01-30 (×5): 150 mg via ORAL
  Filled 2021-01-25 (×5): qty 1

## 2021-01-25 MED ORDER — FENTANYL CITRATE (PF) 250 MCG/5ML IJ SOLN
INTRAMUSCULAR | Status: DC | PRN
  Administered 2021-01-25: 50 ug via INTRAVENOUS

## 2021-01-25 MED ORDER — LEVOTHYROXINE SODIUM 75 MCG PO TABS
75.0000 ug | ORAL_TABLET | Freq: Every day | ORAL | Status: DC
  Administered 2021-01-26 – 2021-01-30 (×5): 75 ug via ORAL
  Filled 2021-01-25 (×5): qty 1

## 2021-01-25 MED ORDER — METHOCARBAMOL 500 MG PO TABS
500.0000 mg | ORAL_TABLET | Freq: Four times a day (QID) | ORAL | Status: DC | PRN
  Administered 2021-01-29: 500 mg via ORAL
  Filled 2021-01-25: qty 1

## 2021-01-25 MED ORDER — METOCLOPRAMIDE HCL 5 MG PO TABS: 5.0000 mg | ORAL_TABLET | Freq: Three times a day (TID) | ORAL | Status: DC | PRN

## 2021-01-25 MED ORDER — CEFAZOLIN SODIUM-DEXTROSE 2-4 GM/100ML-% IV SOLN
2.0000 g | Freq: Two times a day (BID) | INTRAVENOUS | Status: AC
  Administered 2021-01-25: 2 g via INTRAVENOUS
  Filled 2021-01-25: qty 100

## 2021-01-25 MED ORDER — MORPHINE SULFATE (PF) 2 MG/ML IV SOLN: 0.5000 mg | INTRAVENOUS | Status: DC | PRN

## 2021-01-25 MED ORDER — ONDANSETRON HCL 4 MG PO TABS: 4.0000 mg | ORAL_TABLET | Freq: Four times a day (QID) | ORAL | Status: DC | PRN

## 2021-01-25 MED ORDER — FENTANYL CITRATE (PF) 250 MCG/5ML IJ SOLN
INTRAMUSCULAR | Status: AC
  Filled 2021-01-25: qty 5

## 2021-01-25 MED ORDER — ROCURONIUM BROMIDE 10 MG/ML (PF) SYRINGE
PREFILLED_SYRINGE | INTRAVENOUS | Status: DC | PRN
  Administered 2021-01-25: 50 mg via INTRAVENOUS

## 2021-01-25 MED ORDER — METOPROLOL TARTRATE 12.5 MG HALF TABLET
12.5000 mg | ORAL_TABLET | Freq: Two times a day (BID) | ORAL | Status: DC
Start: 2021-01-26 — End: 2021-01-25

## 2021-01-25 MED ORDER — FERROUS SULFATE 325 (65 FE) MG PO TABS
325.0000 mg | ORAL_TABLET | Freq: Three times a day (TID) | ORAL | Status: DC
  Administered 2021-01-25 – 2021-01-30 (×16): 325 mg via ORAL
  Filled 2021-01-25 (×16): qty 1

## 2021-01-25 MED ORDER — TRANEXAMIC ACID-NACL 1000-0.7 MG/100ML-% IV SOLN
1000.0000 mg | INTRAVENOUS | Status: AC
  Administered 2021-01-25: 1000 mg via INTRAVENOUS
  Filled 2021-01-25 (×2): qty 100

## 2021-01-25 MED ORDER — HYDROCODONE-ACETAMINOPHEN 5-325 MG PO TABS
1.0000 | ORAL_TABLET | ORAL | Status: DC | PRN
  Administered 2021-01-25 – 2021-01-26 (×3): 1 via ORAL
  Administered 2021-01-29: 2 via ORAL
  Filled 2021-01-25 (×3): qty 1
  Filled 2021-01-25: qty 2
  Filled 2021-01-25: qty 1

## 2021-01-25 MED ORDER — PHENYLEPHRINE HCL (PRESSORS) 10 MG/ML IV SOLN
INTRAVENOUS | Status: DC | PRN
  Administered 2021-01-25 (×4): 80 ug via INTRAVENOUS

## 2021-01-25 MED ORDER — LACTATED RINGERS IV SOLN: INTRAVENOUS | Status: DC

## 2021-01-25 MED ORDER — RIVAROXABAN 15 MG PO TABS
15.0000 mg | ORAL_TABLET | Freq: Every day | ORAL | Status: DC
  Administered 2021-01-26 – 2021-01-30 (×5): 15 mg via ORAL
  Filled 2021-01-25 (×5): qty 1

## 2021-01-25 MED ORDER — 0.9 % SODIUM CHLORIDE (POUR BTL) OPTIME
TOPICAL | Status: DC | PRN
  Administered 2021-01-25: 1000 mL

## 2021-01-25 MED ORDER — METOCLOPRAMIDE HCL 5 MG/ML IJ SOLN: 5.0000 mg | Freq: Three times a day (TID) | INTRAMUSCULAR | Status: DC | PRN

## 2021-01-25 MED ORDER — FENTANYL CITRATE (PF) 100 MCG/2ML IJ SOLN: 25.0000 ug | INTRAMUSCULAR | Status: DC | PRN

## 2021-01-25 MED ORDER — METHOCARBAMOL 1000 MG/10ML IJ SOLN
500.0000 mg | Freq: Four times a day (QID) | INTRAVENOUS | Status: DC | PRN
  Filled 2021-01-25: qty 5

## 2021-01-25 MED ORDER — CEFAZOLIN SODIUM-DEXTROSE 2-4 GM/100ML-% IV SOLN
2.0000 g | INTRAVENOUS | Status: AC
  Administered 2021-01-25: 2 g via INTRAVENOUS
  Filled 2021-01-25 (×2): qty 100

## 2021-01-25 MED ORDER — MENTHOL 3 MG MT LOZG: 1.0000 | LOZENGE | OROMUCOSAL | Status: DC | PRN

## 2021-01-25 MED ORDER — ACETAMINOPHEN 650 MG RE SUPP: 650.0000 mg | Freq: Four times a day (QID) | RECTAL | Status: DC | PRN

## 2021-01-25 SURGICAL SUPPLY — 37 items
BIT DRILL CANN LG 4.3MM (BIT) IMPLANT
COVER PERINEAL POST (MISCELLANEOUS) ×2 IMPLANT
COVER SURGICAL LIGHT HANDLE (MISCELLANEOUS) ×2 IMPLANT
COVER WAND RF STERILE (DRAPES) ×2 IMPLANT
DERMABOND ADVANCED (GAUZE/BANDAGES/DRESSINGS) ×2
DERMABOND ADVANCED .7 DNX12 (GAUZE/BANDAGES/DRESSINGS) ×1 IMPLANT
DRAPE STERI IOBAN 125X83 (DRAPES) ×2 IMPLANT
DRILL BIT CANN LG 4.3MM (BIT) ×2
DRSG AQUACEL AG ADV 3.5X 6 (GAUZE/BANDAGES/DRESSINGS) ×2 IMPLANT
DRSG MEPILEX BORDER 4X12 (GAUZE/BANDAGES/DRESSINGS) ×2 IMPLANT
DURAPREP 26ML APPLICATOR (WOUND CARE) ×2 IMPLANT
ELECT REM PT RETURN 9FT ADLT (ELECTROSURGICAL) ×2
ELECTRODE REM PT RTRN 9FT ADLT (ELECTROSURGICAL) ×1 IMPLANT
FACESHIELD WRAPAROUND (MASK) ×4 IMPLANT
FACESHIELD WRAPAROUND OR TEAM (MASK) ×2 IMPLANT
GLOVE BIO SURGEON STRL SZ7.5 (GLOVE) ×3 IMPLANT
GLOVE INDICATOR 7.5 STRL GRN (GLOVE) ×2 IMPLANT
GOWN STRL REUS W/ TWL LRG LVL3 (GOWN DISPOSABLE) ×2 IMPLANT
GOWN STRL REUS W/TWL 2XL LVL3 (GOWN DISPOSABLE) ×4 IMPLANT
GOWN STRL REUS W/TWL LRG LVL3 (GOWN DISPOSABLE) ×1
GUIDEPIN 3.2X17.5 THRD DISP (PIN) ×1 IMPLANT
KIT TURNOVER KIT B (KITS) ×2 IMPLANT
MANIFOLD NEPTUNE II (INSTRUMENTS) ×2 IMPLANT
NAIL HIP FRACT 130D 11X180 (Screw) ×1 IMPLANT
NS IRRIG 1000ML POUR BTL (IV SOLUTION) ×2 IMPLANT
PACK GENERAL/GYN (CUSTOM PROCEDURE TRAY) ×2 IMPLANT
PAD ARMBOARD 7.5X6 YLW CONV (MISCELLANEOUS) ×4 IMPLANT
SCREW BONE CORTICAL 5.0X3 (Screw) ×1 IMPLANT
SCREW LAG HIP NAIL 10.5X95 (Screw) ×1 IMPLANT
SUT MNCRL AB 4-0 PS2 18 (SUTURE) ×2 IMPLANT
SUT VIC AB 1 CT1 27 (SUTURE) ×1
SUT VIC AB 1 CT1 27XBRD ANBCTR (SUTURE) ×1 IMPLANT
SUT VIC AB 2-0 CT1 27 (SUTURE) ×1
SUT VIC AB 2-0 CT1 TAPERPNT 27 (SUTURE) ×1 IMPLANT
TOWEL GREEN STERILE (TOWEL DISPOSABLE) ×2 IMPLANT
TOWEL GREEN STERILE FF (TOWEL DISPOSABLE) ×2 IMPLANT
WATER STERILE IRR 1000ML POUR (IV SOLUTION) ×2 IMPLANT

## 2021-01-25 NOTE — Anesthesia Preprocedure Evaluation (Addendum)
Anesthesia Evaluation  Patient identified by MRN, date of birth, ID band Patient awake    Reviewed: Allergy & Precautions, NPO status , Patient's Chart, lab work & pertinent test results, reviewed documented beta blocker date and time   History of Anesthesia Complications Negative for: history of anesthetic complications  Airway Mallampati: I  TM Distance: >3 FB Neck ROM: Full    Dental  (+) Dental Advisory Given   Pulmonary former smoker,  01/24/2021 SARS coronavirus NEG   breath sounds clear to auscultation       Cardiovascular hypertension, Pt. on medications and Pt. on home beta blockers (-) angina+ CAD (LAD and RCA stents) and + Cardiac Stents  + dysrhythmias Atrial Fibrillation  Rhythm:Irregular Rate:Normal  07/2019 ECHO: EF 55-60%. LV diastolic Doppler parameters are consistent with pseudonormalization. RV has normal systolic function.  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 Mild to moderate mitral annular calcification present.  mild-moderate TR   Neuro/Psych Depression negative neurological ROS     GI/Hepatic GERD (h/o esophageal stricture)  Medicated and Controlled,(+)     substance abuse  alcohol use,   Endo/Other  Hypothyroidism   Renal/GU Renal InsufficiencyRenal disease     Musculoskeletal  (+) Arthritis ,   Abdominal   Peds  Hematology Xarelto INR 1.4   Anesthesia Other Findings   Reproductive/Obstetrics                           Anesthesia Physical Anesthesia Plan  ASA: III  Anesthesia Plan: General   Post-op Pain Management:    Induction: Intravenous  PONV Risk Score and Plan: 3 and Ondansetron, Dexamethasone and Treatment may vary due to age or medical condition  Airway Management Planned: Oral ETT  Additional Equipment: None  Intra-op Plan:   Post-operative Plan: Extubation in OR  Informed Consent: I have reviewed the patients History and  Physical, chart, labs and discussed the procedure including the risks, benefits and alternatives for the proposed anesthesia with the patient or authorized representative who has indicated his/her understanding and acceptance.     Dental advisory given and Consent reviewed with POA  Plan Discussed with: Surgeon and CRNA  Anesthesia Plan Comments:        Anesthesia Quick Evaluation

## 2021-01-25 NOTE — Transfer of Care (Signed)
Immediate Anesthesia Transfer of Care Note  Patient: Christina Ortiz  Procedure(s) Performed: INTRAMEDULLARY (IM) NAIL INTERTROCHANTRIC (Right Hip)  Patient Location: PACU  Anesthesia Type:General  Level of Consciousness: awake and patient cooperative  Airway & Oxygen Therapy: Patient Spontanous Breathing and Patient connected to face mask oxygen  Post-op Assessment: Report given to RN and Post -op Vital signs reviewed and stable  Post vital signs: Reviewed and stable  Last Vitals:  Vitals Value Taken Time  BP 164/93 01/25/21 1245  Temp 36.7 C 01/25/21 1245  Pulse 71 01/25/21 1254  Resp 19 01/25/21 1254  SpO2 100 % 01/25/21 1254  Vitals shown include unvalidated device data.  Last Pain:  Vitals:   01/25/21 1245  TempSrc:   PainSc: 0-No pain         Complications: No complications documented.

## 2021-01-25 NOTE — Op Note (Signed)
Christina Ortiz, Christina Ortiz MEDICAL RECORD [id number] ACCOUNT [id number] DATE OF BIRTH:May 15, 1929 FACILITY: [facility] LOCATION: [facility] PHYSICIAN:Deckard Stuber Rosalia Hammers, MD  OPERATIVE REPORT  DATE OF PROCEDURE:  01/25/2021  PREOPERATIVE DIAGNOSIS:  Right comminuted intertrochanteric femur fracture.  POSTOPERATIVE DIAGNOSIS:  Right comminuted intertrochanteric femur fracture.  PROCEDURE:  Open reduction internal fixation of right intertrochanteric femur fracture utilizing a [employer] 11 x 180 mm nail with a 130-degree lag screw and a distal interlock.  SURGEON:  Durene Romans, MD  ASSISTANT:  Surgical team.  ANESTHESIA:  General.  BLOOD LOSS:  Minimal, less than 50 mL.  DRAINS:  None.  COMPLICATIONS:  None.  INDICATIONS:  The patient is an 87 year old female who lives independently.  She unfortunately went out on Saturday to get mail with her walker.  She slipped in ice and fell.  She was brought to the emergency room and noted to have an intertrochanteric  femur fracture.  She has received care through [facility] and hence we were consulted.  Dr. Beverely Low asked for my assistance in managing her today.  We reviewed the indications of the surgery, the risks of malunion, nonunion, infection, DVT, and the  need for future surgeries.  Consent was obtained for fracture management.  DESCRIPTION OF PROCEDURE:  The patient was brought to the operative theater.  Once adequate anesthesia was established, preoperative antibiotics, Ancef and tranexamic acid administered, she was positioned supine on the fracture table.  Her peroneal post  was applied and she was positioned against this.  The right foot was placed in the traction boot.  The left leg was flexed and abducted out of the way with bony prominences padded, particularly over the peroneal nerve laterally.  When she was positioned  and safely padded, we brought fluoroscopy into the field.  I applied traction and internal rotation, was able to  reduce the fracture into a near anatomic position, noting significant comminution on the proximal aspect of the femur.  The right hip was  then prepped and draped in sterile fashion using shower curtain technique.  Timeout was performed, identifying the patient, planned procedure and extremity.  Fluoroscopy was brought back to the field.  I used a guidewire to identify the tip of the  trochanter.  I then made an incision proximally through the gluteal fascia.  A guidewire was then inserted into the tip of the trochanter under fluoroscopic imaging and confirmed in both planes.  The proximal femur was drilled open for the 11 mm nail.   The nail was then passed by hand to its appropriate depth.  Once I had this seated under fluoroscopic image to its appropriate location, I used the gold 3 in 1 sleeve and was able to place the guidewire into the center of the femoral head slightly  inferior on the AP view and slightly posterior on the lateral view.  I then measured and selected a 95 mm lag screw.  I then drilled and placed the lag screw.  Once it was in position, we took traction off the lower extremity and used the compression  wheel to medialize the fracture site to the intertrochanteric segment.  I then locked this in place proximally.  Distally, I placed a screw into the dynamic location to allow for some compression with weightbearing, but not lateralization.  Once this was  done, final radiographs were obtained in AP and lateral planes.  I then irrigated all wounds.  The 2 distal wounds were closed with 2-0 Vicryl and then with Dermabond on the  skin.  The proximal wound was closed in layers with #1 Vicryl on the gluteal  fascia, 2-0 Vicryl and then a running Monocryl stitch.  This wound was clean, dry and dressed sterilely using surgical glue.  All wounds were then covered with Aquacel dressings.  She was then extubated and brought to the recovery room in stable  condition, tolerating the procedure well.   Findings were reviewed with her daughter postoperatively.  Postoperatively, we will have her be partial weightbearing.  I will then determine her disposition based on her functionality with PT and help at home.  Likely she will need SNF.  HN/NUANCE  D:01/25/2021 T:01/25/2021 JOB:[id number]

## 2021-01-25 NOTE — Progress Notes (Signed)
Patient ID: Christina Ortiz, female   DOB: 06/23/1929, 87 y.o.   MRN: [id number] Subjective:     right hip fracture History reviewed with her No other complaints other than her right hip  Patient reports pain as moderate.  Objective:   VITALS:   Vitals:   01/25/21 0606 01/25/21 0747  BP: (!) 150/84 (!) 151/91  Pulse: 77 69  Resp: 15 14  Temp:  97.8 F (36.6 C)  SpO2: 97% 99%    Neurovascular intact  RLE in [facility] traction, ER  LABS Recent Labs    01/24/21 1246 01/24/21 2024 01/25/21 0254  HGB 16.2* 16.3* 16.4*  HCT 49.4* 48.0* 48.7*  WBC 12.4*  --  9.8  PLT 206  --  211    Recent Labs    01/24/21 1554 01/24/21 2024 01/25/21 0254  NA 138 137 140  K 4.4 7.7* 4.3  BUN 26* 45* 28*  CREATININE 0.97 0.90 1.14*  GLUCOSE 110* 115* 134*    Recent Labs    01/24/21 1246 01/25/21 0254  INR 1.5* 1.4*     Assessment/Plan:   right peritrochanteric hip fracture  Plan: NPO To OR today for ORIF of right hip orders placed Consent to be signed Will plan to resume Xarelto tomorrow 

## 2021-01-25 NOTE — H&P (View-Only) (Signed)
Patient ID: Christina Ortiz, female   DOB: 05-Apr-1929, 87 y.o.   MRN: [id number] Subjective:     right hip fracture History reviewed with her No other complaints other than her right hip  Patient reports pain as moderate.  Objective:   VITALS:   Vitals:   01/25/21 0606 01/25/21 0747  BP: (!) 150/84 (!) 151/91  Pulse: 77 69  Resp: 15 14  Temp:  97.8 F (36.6 C)  SpO2: 97% 99%    Neurovascular intact  RLE in [facility] traction, ER  LABS Recent Labs    01/24/21 1246 01/24/21 2024 01/25/21 0254  HGB 16.2* 16.3* 16.4*  HCT 49.4* 48.0* 48.7*  WBC 12.4*  --  9.8  PLT 206  --  211    Recent Labs    01/24/21 1554 01/24/21 2024 01/25/21 0254  NA 138 137 140  K 4.4 7.7* 4.3  BUN 26* 45* 28*  CREATININE 0.97 0.90 1.14*  GLUCOSE 110* 115* 134*    Recent Labs    01/24/21 1246 01/25/21 0254  INR 1.5* 1.4*     Assessment/Plan:   right peritrochanteric hip fracture  Plan: NPO To OR today for ORIF of right hip orders placed Consent to be signed Will plan to resume Xarelto tomorrow

## 2021-01-25 NOTE — Progress Notes (Signed)
Transition of Care [facility]) - CAGE-AID Screening   Patient Details  Name: Christina Ortiz MRN: [id number] Date of Birth: [birth year]   Clinical Narrative: Pt presents to ED from home following a fall this morning that resulted in a right hip fracture. Pt denies any alcohol or stubstance abuse. No need for further screening at this time.    CAGE-AID Screening:    Have You Ever Felt You Ought to Cut Down on Your Drinking or Drug Use?: No Have People Annoyed You By Critizing Your Drinking Or Drug Use?: No Have You Felt Bad Or Guilty About Your Drinking Or Drug Use?: No Have You Ever Had a Drink or Used Drugs First Thing In The Morning to Steady Your Nerves or to Get Rid of a Hangover?: No CAGE-AID Score: 0

## 2021-01-25 NOTE — Brief Op Note (Signed)
01/25/2021  2:59 PM  PATIENT:  Christina Ortiz  87 y.o. female  PRE-OPERATIVE DIAGNOSIS:  right intertrochanteric hip fracture  POST-OPERATIVE DIAGNOSIS:  right intertrochanteric hip fracture  PROCEDURE:  Procedure(s): INTRAMEDULLARY (IM) NAIL INTERTROCHANTRIC (Right)  SURGEON:  Surgeon(s) and Role:    Durene Romans, MD - Primary  PHYSICIAN ASSISTANT: None  ANESTHESIA:   general  EBL:  25 mL   BLOOD ADMINISTERED:none  DRAINS: none   LOCAL MEDICATIONS USED:  NONE  SPECIMEN:  No Specimen  DISPOSITION OF SPECIMEN:  N/A  COUNTS:  YES  TOURNIQUET:  * No tourniquets in log *  DICTATION: .Other Dictation: Dictation Number [id number]  PLAN OF CARE: Admit to inpatient   PATIENT DISPOSITION:  PACU - hemodynamically stable.   Delay start of Pharmacological VTE agent (>24hrs) due to surgical blood loss or risk of bleeding: no

## 2021-01-25 NOTE — ED Notes (Signed)
Madelin Rear MD at bedside

## 2021-01-25 NOTE — Interval H&P Note (Signed)
History and Physical Interval Note:  01/25/2021 10:30 AM  Christina Ortiz  has presented today for surgery, with the diagnosis of right hip fracture.  The various methods of treatment have been discussed with the patient and family. After consideration of risks, benefits and other options for treatment, the patient has consented to  Procedure(s): INTRAMEDULLARY (IM) NAIL INTERTROCHANTRIC (Right) as a surgical intervention.  The patient's history has been reviewed, patient examined, no change in status, stable for surgery.  I have reviewed the patient's chart and labs.  Questions were answered to the patient's satisfaction.     [name]

## 2021-01-25 NOTE — ED Notes (Signed)
Pt O2 turned off, sat 99-100%.

## 2021-01-25 NOTE — Progress Notes (Signed)
PHARMACY NOTE:  ANTIMICROBIAL RENAL DOSAGE ADJUSTMENT  Current antimicrobial regimen includes a mismatch between antimicrobial dosage and estimated renal function.  As per policy approved by the Pharmacy & Therapeutics and Medical Executive Committees, the antimicrobial dosage will be adjusted accordingly.  Current antimicrobial dosage:  Cefazolin 2 gm IV Q 6 hrs X 2  Indication: Post-op surgical prophylaxis  Renal Function:  Estimated Creatinine Clearance: 26.5 mL/min (A) (by C-G formula based on SCr of 1.14 mg/dL (H)). []      On intermittent HD, scheduled: []      On CRRT    Antimicrobial dosage has been changed to:  Cefazolin 2 gm IV Q 12 hrs X 1  Thank you for allowing pharmacy to be a part of this patient's care.  , PharmD, BCPS, [facility] Clinical Pharmacist 01/25/2021 4:41 PM

## 2021-01-25 NOTE — Anesthesia Postprocedure Evaluation (Signed)
Anesthesia Post Note  Patient: Christina Ortiz  Procedure(s) Performed: INTRAMEDULLARY (IM) NAIL INTERTROCHANTRIC (Right Hip)     Patient location during evaluation: PACU Anesthesia Type: General Level of consciousness: awake and alert, patient cooperative and oriented Pain management: pain level controlled Vital Signs Assessment: post-procedure vital signs reviewed and stable Respiratory status: spontaneous breathing, nonlabored ventilation, respiratory function stable and patient connected to nasal cannula oxygen Cardiovascular status: blood pressure returned to baseline and stable Postop Assessment: no apparent nausea or vomiting Anesthetic complications: no   No complications documented.  Last Vitals:  Vitals:   01/25/21 1330 01/25/21 1345  BP: 133/88 130/87  Pulse: 62 65  Resp: 14 14  Temp:    SpO2: 98% 99%    Last Pain:  Vitals:   01/25/21 1345  TempSrc:   PainSc: Asleep                 Sweta Halseth,E. Angelicia Lessner

## 2021-01-25 NOTE — Plan of Care (Signed)

## 2021-01-25 NOTE — Anesthesia Procedure Notes (Signed)
Procedure Name: Intubation Date/Time: 01/25/2021 11:41 AM Performed by: Clearnce Sorrel, CRNA Pre-anesthesia Checklist: Patient identified, Emergency Drugs available, Suction available, Patient being monitored and Timeout performed Patient Re-evaluated:Patient Re-evaluated prior to induction Oxygen Delivery Method: Circle system utilized Preoxygenation: Pre-oxygenation with 100% oxygen Induction Type: IV induction Ventilation: Mask ventilation without difficulty Laryngoscope Size: Mac and 3 Grade View: Grade I Tube type: Oral Tube size: 7.0 mm Number of attempts: 1 Airway Equipment and Method: Stylet Placement Confirmation: ETT inserted through vocal cords under direct vision,  positive ETCO2 and breath sounds checked- equal and bilateral Secured at: 22 cm Tube secured with: Tape Dental Injury: Teeth and Oropharynx as per pre-operative assessment

## 2021-01-25 NOTE — Progress Notes (Signed)
PROGRESS NOTE    Christina Ortiz  OZD:[id number] DOB: May 01, 1929 DOA: 01/24/2021 PCP: Mayer Masker, PA-C   Brief Narrative: 87 year old with past medical history significant for CAD status post PCI with a stent placement to RCA and LAD 2006, paroxysmal atrial fibrillation on chronic anticoagulation with Xarelto, hypothyroidism, hypertension, and stage IIIa chronic kidney disease baseline creatinine 0.9--1.3 who presented to [facility] 01/24/2021 after a mechanical fall and found to have a right intertrochanteric hip fracture.  Evaluation in the ED: COVID-19 PCR negative, sodium 138, creatinine 0.9, x-ray right hip show evidence of acute comminuted right intertrochanteric hip fractures with varus angulation.  EKG no changes from prior EKG.  Orthopedic consulted and plan is for surgery, intramedullary nail intertrochanteric on the right had a surgical intervention on 1/30.    Assessment & Plan:   Principal Problem:   Hip fx, right, closed, initial encounter [facility]) Active Problems:   Chronic anticoagulation   Hypertension   Paroxysmal atrial fibrillation (HCC)   Right hip pain   Leukocytosis   Depression  1-Right Intertrochanteric hip fracture: -Plan for surgery today 1 8/30 -Patient seen prior to surgery, she denies chest pain and shortness of breath.  She is stable. Chales Abrahams score 4.7 -DVT prophylaxis post surgery per Ortho -Vitamin D level; 30  2-Mechanical fall: She will likely require rehab after surgery. QV:ZDGLOVFI leukocytes, nitrates.    3-Leukocytosis; Likely Reactive.  Resolved.   4-PAF;  Holding Xarelto pre op.  Resume xarelto when ok by ortho.  Resume  Metoprolol.   5-HTN;  Hold Quinapril and spironolactone, norvasc,  pre op.  Resume norvasc when taking oral and if BP is elevated.   6-Hypothyroidism;  Resume synthroid.   7-Depression; resume Wellbutrin and paxil 1-31.     Estimated body mass index is 23.05 kg/m as calculated from the  following:   Height as of this encounter: 5\' 2"  (1.575 m).   Weight as of this encounter: 57.2 kg.   DVT prophylaxis: resume xarelto post op Code Status: Full code Family Communication: Disposition Plan:  Status is: Inpatient  Remains inpatient appropriate because:IV treatments appropriate due to intensity of illness or inability to take PO   Dispo: The patient is from: Home              Anticipated d/c is to: SNF probably               Anticipated d/c date is: 3 days              Patient currently is not medically stable to d/c.   Difficult to place patient No        Consultants:   ortho  Procedures:   SX  Antimicrobials:    Subjective: Patient seen this am in the ED. She was not in pain. She denies chest pain or dyspnea. She appears comfortable.   Objective: Vitals:   01/25/21 0224 01/25/21 0406 01/25/21 0606 01/25/21 0747  BP: (!) 138/93 (!) 139/99 (!) 150/84 (!) 151/91  Pulse: 75 72 77 69  Resp: 15 16 15 14   Temp:    97.8 F (36.6 C)  TempSrc:    Oral  SpO2: 98% 98% 97% 99%  Weight:      Height:       No intake or output data in the 24 hours ending 01/25/21 0811 Filed Weights   01/24/21 1114  Weight: 57.2 kg    Examination:  General exam: Appears calm and comfortable  Respiratory system: Clear to auscultation. Respiratory  effort normal. Cardiovascular system: S1 & S2 heard, RRR. No JVD, murmurs, rubs, gallops or clicks. No pedal edema. Gastrointestinal system: Abdomen is nondistended, soft and nontender. No organomegaly or masses felt. Normal bowel sounds heard. Central nervous system: Alert  Extremities: RLE immobilizer dgement and insight appear normal. Mood & affect appropriate.     Data Reviewed: I have personally reviewed following labs and imaging studies  CBC: Recent Labs  Lab 01/24/21 1246 01/24/21 2024 01/25/21 0254  WBC 12.4*  --  9.8  NEUTROABS 10.7*  --  8.2*  HGB 16.2* 16.3* 16.4*  HCT 49.4* 48.0* 48.7*  MCV 102.1*   --  100.0  PLT 206  --  211   Basic Metabolic Panel: Recent Labs  Lab 01/24/21 1554 01/24/21 2024 01/25/21 0254  NA 138 137 140  K 4.4 7.7* 4.3  CL 105 106 105  CO2 22  --  24  GLUCOSE 110* 115* 134*  BUN 26* 45* 28*  CREATININE 0.97 0.90 1.14*  CALCIUM 9.2  --  9.4  MG  --   --  2.1   GFR: Estimated Creatinine Clearance: 26.5 mL/min (A) (by C-G formula based on SCr of 1.14 mg/dL (H)). Liver Function Tests: No results for input(s): AST, ALT, ALKPHOS, BILITOT, PROT, ALBUMIN in the last 168 hours. No results for input(s): LIPASE, AMYLASE in the last 168 hours. No results for input(s): AMMONIA in the last 168 hours. Coagulation Profile: Recent Labs  Lab 01/24/21 1246 01/25/21 0254  INR 1.5* 1.4*   Cardiac Enzymes: No results for input(s): CKTOTAL, CKMB, CKMBINDEX, TROPONINI in the last 168 hours. BNP (last 3 results) No results for input(s): PROBNP in the last 8760 hours. HbA1C: No results for input(s): HGBA1C in the last 72 hours. CBG: No results for input(s): GLUCAP in the last 168 hours. Lipid Profile: No results for input(s): CHOL, HDL, LDLCALC, TRIG, CHOLHDL, LDLDIRECT in the last 72 hours. Thyroid Function Tests: No results for input(s): TSH, T4TOTAL, FREET4, T3FREE, THYROIDAB in the last 72 hours. Anemia Panel: No results for input(s): VITAMINB12, FOLATE, FERRITIN, TIBC, IRON, RETICCTPCT in the last 72 hours. Sepsis Labs: No results for input(s): PROCALCITON, LATICACIDVEN in the last 168 hours.  Recent Results (from the past 240 hour(s))  SARS Coronavirus 2 by RT PCR (hospital order, performed in [facility] hospital lab) Nasopharyngeal Nasopharyngeal Swab     Status: None   Collection Time: 01/24/21  1:39 PM   Specimen: Nasopharyngeal Swab  Result Value Ref Range Status   SARS Coronavirus 2 NEGATIVE NEGATIVE Final    Comment: (NOTE) SARS-CoV-2 target nucleic acids are NOT DETECTED.  The SARS-CoV-2 RNA is generally detectable in upper and  lower respiratory specimens during the acute phase of infection. The lowest concentration of SARS-CoV-2 viral copies this assay can detect is 250 copies / mL. A negative result does not preclude SARS-CoV-2 infection and should not be used as the sole basis for treatment or other patient management decisions.  A negative result may occur with improper specimen collection / handling, submission of specimen other than nasopharyngeal swab, presence of viral mutation(s) within the areas targeted by this assay, and inadequate number of viral copies (<250 copies / mL). A negative result must be combined with clinical observations, patient history, and epidemiological information.  Fact Sheet for Patients:   BoilerBrush.com.cy  Fact Sheet for Healthcare Providers: https://pope.com/  This test is not yet approved or  cleared by the [place] FDA and has been authorized for detection and/or diagnosis of  SARS-CoV-2 by FDA under an Emergency Use Authorization (EUA).  This EUA will remain in effect (meaning this test can be used) for the duration of the COVID-19 declaration under Section 564(b)(1) of the Act, 21 U.S.C. section 360bbb-3(b)(1), unless the authorization is terminated or revoked sooner.  Performed at [facility], [address] [place], [place] [address]          Radiology Studies: CT HEAD WO CONTRAST  Result Date: 01/24/2021 CLINICAL DATA:  Head trauma, fell on snow, on anticoagulants EXAM: CT HEAD WITHOUT CONTRAST TECHNIQUE: Contiguous axial images were obtained from the base of the skull through the vertex without intravenous contrast. Sagittal and coronal MPR images reconstructed from axial data set. COMPARISON:  07/23/2020 FINDINGS: Brain: Generalized atrophy. Normal ventricular morphology. No midline shift or mass effect. Small vessel chronic ischemic changes of deep cerebral white matter. Small old LEFT basal  ganglia lacunar infarct. Old RIGHT cerebellar infarct. No intracranial hemorrhage, mass lesion, or evidence of acute infarction. No extra-axial fluid collections. Vascular: No hyperdense vessels. Atherosclerotic calcification of internal carotid arteries at skull base Skull: Intact Sinuses/Orbits: Clear Other: N/A IMPRESSION: Atrophy with small vessel chronic ischemic changes of deep cerebral white matter. 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 No acute intracranial abnormalities. Electronically Signed   By: Ulyses Southward M.D.   On: 01/24/2021 11:58   DG Hip Unilat  With Pelvis 2-3 Views Right  Result Date: 01/24/2021 CLINICAL DATA:  Right hip pain and deformity following a fall today. EXAM: DG HIP (WITH OR WITHOUT PELVIS) 2-3V RIGHT COMPARISON:  06/23/2020 FINDINGS: Interval acute, comminuted intertrochanteric fracture on the right with varus angulation. The previously demonstrated superior trochanter fracture fragments are unchanged. Diffuse osteopenia. IMPRESSION: Acute, comminuted right intertrochanteric fracture with varus angulation. Electronically Signed   By: Beckie Salts M.D.   On: 01/24/2021 12:04        Scheduled Meds: . [START ON 01/26/2021] metoprolol tartrate  12.5 mg Oral BID   Continuous Infusions: . lactated ringers       LOS: 1 day    Time spent: 35 minutes.     Alba Cory, MD [facility]   If 7PM-7AM, please contact night-coverage [email or web address]  01/25/2021, 8:11 AM

## 2021-01-26 ENCOUNTER — Encounter (HOSPITAL_COMMUNITY): Payer: Self-pay | Admitting: Orthopedic Surgery

## 2021-01-26 DIAGNOSIS — S72001A Fracture of unspecified part of neck of right femur, initial encounter for closed fracture: Secondary | ICD-10-CM | POA: Diagnosis not present

## 2021-01-26 LAB — CBC
HCT: 42.3 % (ref 36.0–46.0)
Hemoglobin: 13.6 g/dL (ref 12.0–15.0)
MCH: 33.6 pg (ref 26.0–34.0)
MCHC: 32.2 g/dL (ref 30.0–36.0)
MCV: 104.4 fL — ABNORMAL HIGH (ref 80.0–100.0)
Platelets: 180 10*3/uL (ref 150–400)
RBC: 4.05 MIL/uL (ref 3.87–5.11)
RDW: 12.8 % (ref 11.5–15.5)
WBC: 12.9 10*3/uL — ABNORMAL HIGH (ref 4.0–10.5)
nRBC: 0 % (ref 0.0–0.2)

## 2021-01-26 LAB — BASIC METABOLIC PANEL
Anion gap: 10 (ref 5–15)
BUN: 40 mg/dL — ABNORMAL HIGH (ref 8–23)
CO2: 23 mmol/L (ref 22–32)
Calcium: 8.7 mg/dL — ABNORMAL LOW (ref 8.9–10.3)
Chloride: 105 mmol/L (ref 98–111)
Creatinine, Ser: 1.51 mg/dL — ABNORMAL HIGH (ref 0.44–1.00)
GFR, Estimated: 33 mL/min — ABNORMAL LOW (ref >60)
Glucose, Bld: 153 mg/dL — ABNORMAL HIGH (ref 70–99)
Potassium: 4.6 mmol/L (ref 3.5–5.1)
Sodium: 138 mmol/L (ref 135–145)

## 2021-01-26 MED ORDER — POLYETHYLENE GLYCOL 3350 17 G PO PACK
17.0000 g | PACK | Freq: Two times a day (BID) | ORAL | Status: DC
  Administered 2021-01-26 – 2021-01-30 (×9): 17 g via ORAL
  Filled 2021-01-26 (×9): qty 1

## 2021-01-26 NOTE — Plan of Care (Signed)

## 2021-01-26 NOTE — Progress Notes (Addendum)
PROGRESS NOTE    Christina Ortiz  LFY:[id number] DOB: 1929/06/22 DOA: 01/24/2021 PCP: Mayer Masker, PA-C   Brief Narrative: 87 year old with past medical history significant for CAD status post PCI with a stent placement to RCA and LAD 2006, paroxysmal atrial fibrillation on chronic anticoagulation with Xarelto, hypothyroidism, hypertension, and stage IIIa chronic kidney disease baseline creatinine 0.9--1.3 who presented to [facility] 01/24/2021 after a mechanical fall and found to have a right intertrochanteric hip fracture.  Evaluation in the ED: COVID-19 PCR negative, sodium 138, creatinine 0.9, x-ray right hip show evidence of acute comminuted right intertrochanteric hip fractures with varus angulation.  EKG no changes from prior EKG.  Orthopedic consulted and plan is for surgery, intramedullary nail intertrochanteric on the right had a surgical intervention on 1/30.    Assessment & Plan:   Principal Problem:   Hip fx, right, closed, initial encounter [facility]) Active Problems:   Chronic anticoagulation   Hypertension   Paroxysmal atrial fibrillation (HCC)   Right hip pain   Leukocytosis   Depression  1-Right Intertrochanteric hip fracture: -Underwent open reduction internal fixation of right intertrochanteric femur fracture nail on 1/30. Chales Abrahams score 4.7 -DVT prophylaxis; Back on Xarelto.  -Vitamin D level; 30  2-Mechanical fall: She will likely require rehab after surgery. EN:IDPOEUMP leukocytes, nitrates.    3-Leukocytosis; Likely Reactive.  Initially resolved. WBC increase today. Plan to repeat tomorrow.   4-PAF;  Back on xarelto.  Continue with   Metoprolol.   5-HTN;  Hold Quinapril and spironolactone due to AKI.  Resume norvasc when taking oral and if BP is elevated.  BP in the 120 range, hold Norvasc.   6-Hypothyroidism;  Continue with synthroid.   7-Depression; Continue with Wellbutrin and paxil.   8-AKI;  Cr increase to 1.5.  Baseline  0.9---1.3 Continue with IV fluids.  Repeat labs tomorrow.   Increase MCV;  Check B 12, folic acid.  Resume folic acid supplement tomorrow.   Hemoconcentration; hb at 16 Could have been related to dehydration.  Patient does not smokes.   Estimated body mass index is 23.87 kg/m as calculated from the following:   Height as of this encounter: 5\' 2"  (1.575 m).   Weight as of this encounter: 59.2 kg.   DVT prophylaxis: Xarelto Code Status: Full code Family Communication: daughter on 1/30---1/31. Disposition Plan:  Status is: Inpatient  Remains inpatient appropriate because:IV treatments appropriate due to intensity of illness or inability to take PO   Dispo: The patient is from: Home              Anticipated d/c is to: SNF probably               Anticipated d/c date is: 2 days              Patient currently is not medically stable to d/c.   Difficult to place patient No        Consultants:   ortho  Procedures:   SX  Antimicrobials:    Subjective: Patient is alert, denies dyspnea. No BM yet.  Received pain medication earlier.    Objective: Vitals:   01/26/21 0407 01/26/21 0500 01/26/21 0820 01/26/21 1504  BP: 119/73  (!) 141/96 122/71  Pulse: 63  72 62  Resp: 17  16 16   Temp: 98 F (36.7 C)  98 F (36.7 C)   TempSrc: Oral  Oral   SpO2: 95%  98% 93%  Weight:  59.2 kg    Height:  Intake/Output Summary (Last 24 hours) at 01/26/2021 1535 Last data filed at 01/26/2021 1300 Gross per 24 hour  Intake 290 ml  Output 300 ml  Net -10 ml   Filed Weights   01/24/21 1114 01/26/21 0500  Weight: 57.2 kg 59.2 kg    Examination:  General exam: NAD Respiratory system; CTA Cardiovascular system: S 1, S 2 RRR Gastrointestinal system: BS present, soft, nt. Central nervous system: Alert Extremities: RLE with clean dressing hip   Data Reviewed: I have personally reviewed following labs and imaging studies  CBC: Recent Labs  Lab 01/24/21 1246  01/24/21 2024 01/25/21 0254 01/26/21 0441  WBC 12.4*  --  9.8 12.9*  NEUTROABS 10.7*  --  8.2*  --   HGB 16.2* 16.3* 16.4* 13.6  HCT 49.4* 48.0* 48.7* 42.3  MCV 102.1*  --  100.0 104.4*  PLT 206  --  211 180   Basic Metabolic Panel: Recent Labs  Lab 01/24/21 1554 01/24/21 2024 01/25/21 0254 01/26/21 0441  NA 138 137 140 138  K 4.4 7.7* 4.3 4.6  CL 105 106 105 105  CO2 22  --  24 23  GLUCOSE 110* 115* 134* 153*  BUN 26* 45* 28* 40*  CREATININE 0.97 0.90 1.14* 1.51*  CALCIUM 9.2  --  9.4 8.7*  MG  --   --  2.1  --    GFR: Estimated Creatinine Clearance: 20 mL/min (A) (by C-G formula based on SCr of 1.51 mg/dL (H)). Liver Function Tests: No results for input(s): AST, ALT, ALKPHOS, BILITOT, PROT, ALBUMIN in the last 168 hours. No results for input(s): LIPASE, AMYLASE in the last 168 hours. No results for input(s): AMMONIA in the last 168 hours. Coagulation Profile: Recent Labs  Lab 01/24/21 1246 01/25/21 0254  INR 1.5* 1.4*   Cardiac Enzymes: No results for input(s): CKTOTAL, CKMB, CKMBINDEX, TROPONINI in the last 168 hours. BNP (last 3 results) No results for input(s): PROBNP in the last 8760 hours. HbA1C: No results for input(s): HGBA1C in the last 72 hours. CBG: No results for input(s): GLUCAP in the last 168 hours. Lipid Profile: No results for input(s): CHOL, HDL, LDLCALC, TRIG, CHOLHDL, LDLDIRECT in the last 72 hours. Thyroid Function Tests: No results for input(s): TSH, T4TOTAL, FREET4, T3FREE, THYROIDAB in the last 72 hours. Anemia Panel: No results for input(s): VITAMINB12, FOLATE, FERRITIN, TIBC, IRON, RETICCTPCT in the last 72 hours. Sepsis Labs: No results for input(s): PROCALCITON, LATICACIDVEN in the last 168 hours.  Recent Results (from the past 240 hour(s))  SARS Coronavirus 2 by RT PCR (hospital order, performed in [facility] hospital lab) Nasopharyngeal Nasopharyngeal Swab     Status: None   Collection Time: 01/24/21  1:39 PM   Specimen:  Nasopharyngeal Swab  Result Value Ref Range Status   SARS Coronavirus 2 NEGATIVE NEGATIVE Final    Comment: (NOTE) SARS-CoV-2 target nucleic acids are NOT DETECTED.  The SARS-CoV-2 RNA is generally detectable in upper and lower respiratory specimens during the acute phase of infection. The lowest concentration of SARS-CoV-2 viral copies this assay can detect is 250 copies / mL. A negative result does not preclude SARS-CoV-2 infection and should not be used as the sole basis for treatment or other patient management decisions.  A negative result may occur with improper specimen collection / handling, submission of specimen other than nasopharyngeal swab, presence of viral mutation(s) within the areas targeted by this assay, and inadequate number of viral copies (<250 copies / mL). A negative result must be  combined with clinical observations, patient history, and epidemiological information.  Fact Sheet for Patients:   BoilerBrush.com.cy  Fact Sheet for Healthcare Providers: https://pope.com/  This test is not yet approved or  cleared by the [place] FDA and has been authorized for detection and/or diagnosis of SARS-CoV-2 by FDA under an Emergency Use Authorization (EUA).  This EUA will remain in effect (meaning this test can be used) for the duration of the COVID-19 declaration under Section 564(b)(1) of the Act, 21 U.S.C. section 360bbb-3(b)(1), unless the authorization is terminated or revoked sooner.  Performed at [facility], [address], [place], [place] [address]          Radiology Studies: DG C-Arm 1-60 Min  Result Date: 01/25/2021 CLINICAL DATA:  RIGHT hip fracture EXAM: DG C-ARM 1-60 MIN; OPERATIVE RIGHT HIP WITH PELVIS FLUOROSCOPY TIME:  Fluoroscopy Time:  53 seconds Radiation Exposure Index (if provided by the fluoroscopic device): 5.12 mGy COMPARISON:  Plain film of the pelvis and RIGHT hip dated  01/24/2021. FINDINGS: Intraoperative fluoroscopic images show intramedullary rod and fixation screw traversing the RIGHT femoral neck fracture site. Osseous alignment is significantly improved and now appears anatomic. Hardware appears intact and appropriately positioned. IMPRESSION: Intraoperative fluoroscopic images, as detailed above. No evidence of surgical complicating feature. Electronically Signed   By: Bary Richard M.D.   On: 01/25/2021 14:20   DG HIP OPERATIVE UNILAT WITH PELVIS RIGHT  Result Date: 01/25/2021 CLINICAL DATA:  RIGHT hip fracture EXAM: DG C-ARM 1-60 MIN; OPERATIVE RIGHT HIP WITH PELVIS FLUOROSCOPY TIME:  Fluoroscopy Time:  53 seconds Radiation Exposure Index (if provided by the fluoroscopic device): 5.12 mGy COMPARISON:  Plain film of the pelvis and RIGHT hip dated 01/24/2021. FINDINGS: Intraoperative fluoroscopic images show intramedullary rod and fixation screw traversing the RIGHT femoral neck fracture site. Osseous alignment is significantly improved and now appears anatomic. Hardware appears intact and appropriately positioned. IMPRESSION: Intraoperative fluoroscopic images, as detailed above. No evidence of surgical complicating feature. Electronically Signed   By: Bary Richard M.D.   On: 01/25/2021 14:20        Scheduled Meds: . buPROPion  150 mg Oral Daily  . docusate sodium  100 mg Oral BID  . ferrous sulfate  325 mg Oral TID PC  . levothyroxine  75 mcg Oral QAC breakfast  . metoprolol tartrate  12.5 mg Oral BID  . PARoxetine  40 mg Oral Daily  . polyethylene glycol  17 g Oral BID  . Rivaroxaban  15 mg Oral Q supper  . simvastatin  5 mg Oral QPM   Continuous Infusions: . lactated ringers 75 mL/hr at 01/25/21 2211  . methocarbamol (ROBAXIN) IV       LOS: 2 days    Time spent: 35 minutes.     Alba Cory, MD [facility]   If 7PM-7AM, please contact night-coverage [email or web address]  01/26/2021, 3:35 PM

## 2021-01-26 NOTE — Progress Notes (Signed)
PT Cancellation Note  Patient Details Name: Christina Ortiz MRN: [id number] DOB: [birth year]   Cancelled Treatment:    Reason Eval/Treat Not Completed: Active bedrest order Ortho, please remove bedrest orders so PT can perform evaluation, if appropriate, thanks. Will follow.   Blake Divine A Cassius Cullinane 01/26/2021, 7:28 AM [place], PT, DPT Acute Rehabilitation Services Pager [phone] Office [phone]

## 2021-01-26 NOTE — Evaluation (Signed)
Physical Therapy Evaluation Patient Details Name: Christina Ortiz MRN: [id number] DOB: 12-Oct-1929 Today's Date: 01/26/2021   History of Present Illness  Patient is a 87 y/o female who presents with lright hip fx s/p fall in the snow now s/p right IM nailing right femur 01/25/21.  PMH includes PAF, bronchitis, HTN, depression, CKD, CAD, heart block, right humerus fx repair 03/2018.  Clinical Impression  Patient presents with pain and post surgical deficits s/p above surgery. Pt lives alone in a 1 level condo and reports using rollator for ambulation PRN PTA. Reports multiple falls at home and has a daughter that checks on her daily. Today, pt requires Mod A for bed mobility, Min A for standing and Min guard for gait training with use of RW for support. Some difficulty maintaining PWB RLE 50% during mobility despite max cues. Sp02 dropped to 88% on RA during activity but recovered quickly with rest break. If daughter able to provide 24/7 supervision, pt may progress well enough to go home with HHPT, however if not, recommend SNF to maximize independence and mobility prior to return home. Will follow acutely.    Follow Up Recommendations SNF;Supervision for mobility/OOB    Equipment Recommendations  None recommended by PT    Recommendations for Other Services       Precautions / Restrictions Precautions Precautions: Fall;Other (comment) Precaution Comments: watch 02 Restrictions Weight Bearing Restrictions: Yes RLE Weight Bearing: Partial weight bearing RLE Partial Weight Bearing Percentage or Pounds: 50%      Mobility  Bed Mobility Overal bed mobility: Needs Assistance Bed Mobility: Supine to Sit     Supine to sit: HOB elevated;Mod assist     General bed mobility comments: Assist with RLE to get to EOB and scooting bottom; cues for technique and to reach for rail.    Transfers Overall transfer level: Needs assistance Equipment used: Rolling walker (2 wheeled) Transfers: Sit  to/from Stand Sit to Stand: Min assist         General transfer comment: Assist to power to standing with cues for hand placement/technique, cues for 50% WB RLE, some difficulty maintaining. No recliner in room, RN to try to find one as no empty rooms onf loor.  Ambulation/Gait Ambulation/Gait assistance: Min guard Gait Distance (Feet): 20 Feet Assistive device: Rolling walker (2 wheeled) Gait Pattern/deviations: Step-to pattern;Decreased stance time - right;Decreased step length - left;Trunk flexed Gait velocity: decreased Gait velocity interpretation: <1.31 ft/sec, indicative of household ambulator General Gait Details: Slow, mildly unsteady gait with max cues for PWB RLE, cues for RW proximity/management. Sp02 dropped to 88% on RA, recovered quickly. Difficulty maintaining WB status.  Stairs            Wheelchair Mobility    Modified Rankin (Stroke Patients Only)       Balance Overall balance assessment: History of Falls;Needs assistance Sitting-balance support: Feet supported;No upper extremity supported Sitting balance-Leahy Scale: Good Sitting balance - Comments: Supervision for safety. Posterior lean with MMT BLEs. Postural control: Posterior lean Standing balance support: During functional activity Standing balance-Leahy Scale: Poor Standing balance comment: Requires UE support in standing.                             Pertinent Vitals/Pain Pain Assessment: 0-10 Pain Score: 4  Pain Location: right hip Pain Descriptors / Indicators: Sore;Operative site guarding Pain Intervention(s): Monitored during session;Repositioned;Premedicated before session    Home Living Family/patient expects to be discharged to:: Private residence Living  Arrangements: Alone Available Help at Discharge: Family;Available PRN/intermittently Type of Home: House Home Access: Level entry     Home Layout: One level Home Equipment: Gilmer Mor - quad;Walker - 4 wheels;Bedside  commode;Shower seat;Wheelchair - manual;Cane - single point      Prior Function Level of Independence: Independent with assistive device(s)         Comments: Does own ADLs/IADLs and uses rollator PRN. Daughter took away car keys. Has had a few falls at home.     Hand Dominance   Dominant Hand: Right    Extremity/Trunk Assessment   Upper Extremity Assessment Upper Extremity Assessment: Defer to OT evaluation    Lower Extremity Assessment Lower Extremity Assessment: RLE deficits/detail RLE Deficits / Details: Swelling present; sensation WFL per report. post op weakness/pain. RLE: Unable to fully assess due to pain    Cervical / Trunk Assessment Cervical / Trunk Assessment: Kyphotic  Communication   Communication: HOH  Cognition Arousal/Alertness: Awake/alert Behavior During Therapy: WFL for tasks assessed/performed Overall Cognitive Status: No family/caregiver present to determine baseline cognitive functioning                                 General Comments: Requires repetition due to [facility]; thinks it is February. Difficulty understanding pain scale.      General Comments General comments (skin integrity, edema, etc.): Swelling present RLE, bandage- clean, dry and intact.    Exercises General Exercises - Lower Extremity Ankle Circles/Pumps: AROM;Both;15 reps;Supine Quad Sets: AROM;Both;10 reps;Supine Long Arc Quad: AROM;Both;5 reps;Seated   Assessment/Plan    PT Assessment Patient needs continued PT services  PT Problem List Decreased strength;Decreased mobility;Decreased range of motion;Decreased knowledge of precautions;Decreased cognition;Cardiopulmonary status limiting activity;Decreased skin integrity;Pain;Decreased balance;Decreased knowledge of use of DME       PT Treatment Interventions Therapeutic exercise;Gait training;Balance training;Neuromuscular re-education;Functional mobility training;Therapeutic activities;Patient/family  education;Cognitive remediation;DME instruction    PT Goals (Current goals can be found in the Care Plan section)  Acute Rehab PT Goals Patient Stated Goal: to get better PT Goal Formulation: With patient Time For Goal Achievement: 02/09/21 Potential to Achieve Goals: Good    Frequency Min 3X/week   Barriers to discharge Decreased caregiver support lives alone    Co-evaluation               AM-PAC PT "6 Clicks" Mobility  Outcome Measure Help needed turning from your back to your side while in a flat bed without using bedrails?: A Little Help needed moving from lying on your back to sitting on the side of a flat bed without using bedrails?: A Lot Help needed moving to and from a bed to a chair (including a wheelchair)?: A Little Help needed standing up from a chair using your arms (e.g., wheelchair or bedside chair)?: A Little Help needed to walk in hospital room?: A Little Help needed climbing 3-5 steps with a railing? : Total 6 Click Score: 15    End of Session Equipment Utilized During Treatment: Gait belt Activity Tolerance: Patient tolerated treatment well Patient left: in bed;with call bell/phone within reach;with nursing/sitter in room Nurse Communication: Mobility status PT Visit Diagnosis: Pain;Unsteadiness on feet (R26.81);Muscle weakness (generalized) (M62.81);Other abnormalities of gait and mobility (R26.89) Pain - Right/Left: Right Pain - part of body: Hip    Time: 3875-6433 PT Time Calculation (min) (ACUTE ONLY): 26 min   Charges:   PT Evaluation $PT Eval Moderate Complexity: 1 Mod PT Treatments $Gait Training:  8-22 mins        Vale Haven, PT, DPT Acute Rehabilitation Services Pager [phone] Office [phone]      Marcy Panning 01/26/2021, 9:35 AM

## 2021-01-27 DIAGNOSIS — S72001A Fracture of unspecified part of neck of right femur, initial encounter for closed fracture: Secondary | ICD-10-CM | POA: Diagnosis not present

## 2021-01-27 LAB — BASIC METABOLIC PANEL
Anion gap: 7 (ref 5–15)
BUN: 36 mg/dL — ABNORMAL HIGH (ref 8–23)
CO2: 24 mmol/L (ref 22–32)
Calcium: 8.6 mg/dL — ABNORMAL LOW (ref 8.9–10.3)
Chloride: 103 mmol/L (ref 98–111)
Creatinine, Ser: 1.16 mg/dL — ABNORMAL HIGH (ref 0.44–1.00)
GFR, Estimated: 45 mL/min — ABNORMAL LOW (ref >60)
Glucose, Bld: 96 mg/dL (ref 70–99)
Potassium: 4 mmol/L (ref 3.5–5.1)
Sodium: 134 mmol/L — ABNORMAL LOW (ref 135–145)

## 2021-01-27 LAB — CBC
HCT: 35.5 % — ABNORMAL LOW (ref 36.0–46.0)
Hemoglobin: 12.2 g/dL (ref 12.0–15.0)
MCH: 34.6 pg — ABNORMAL HIGH (ref 26.0–34.0)
MCHC: 34.4 g/dL (ref 30.0–36.0)
MCV: 100.6 fL — ABNORMAL HIGH (ref 80.0–100.0)
Platelets: 171 10*3/uL (ref 150–400)
RBC: 3.53 MIL/uL — ABNORMAL LOW (ref 3.87–5.11)
RDW: 12.8 % (ref 11.5–15.5)
WBC: 14.2 10*3/uL — ABNORMAL HIGH (ref 4.0–10.5)
nRBC: 0 % (ref 0.0–0.2)

## 2021-01-27 LAB — VITAMIN B12: Vitamin B-12: 228 pg/mL (ref 180–914)

## 2021-01-27 LAB — FOLATE: Folate: 17.2 ng/mL (ref >5.9)

## 2021-01-27 MED ORDER — VITAMIN B-12 100 MCG PO TABS
100.0000 ug | ORAL_TABLET | Freq: Every day | ORAL | Status: DC
  Administered 2021-01-27 – 2021-01-30 (×4): 100 ug via ORAL
  Filled 2021-01-27 (×5): qty 1

## 2021-01-27 MED ORDER — HYDROCODONE-ACETAMINOPHEN 5-325 MG PO TABS: 1.0000 | ORAL_TABLET | Freq: Four times a day (QID) | ORAL | 0 refills | Status: DC | PRN

## 2021-01-27 MED ORDER — SENNA 8.6 MG PO TABS
1.0000 | ORAL_TABLET | Freq: Every day | ORAL | Status: DC
  Administered 2021-01-27 – 2021-01-30 (×4): 8.6 mg via ORAL
  Filled 2021-01-27 (×4): qty 1

## 2021-01-27 NOTE — NC FL2 (Signed)
[place] MEDICAID FL2 LEVEL OF CARE SCREENING TOOL     IDENTIFICATION  Patient Name: Christina Ortiz Birthdate: 09/24/1929 Sex: female Admission Date (Current Location): 01/24/2021  [facility] and [place] Number:  Producer, television/film/video and Address:  [facility], [address] [place], [place] [address]      Provider Number: [id number]  Attending Physician Name and Address:  Alba Cory, MD  Relative Name and Phone Number:       Current Level of Care: Hospital Recommended Level of Care: Skilled Nursing Facility Prior Approval Number:    Date Approved/Denied:   PASRR Number: waived  Discharge Plan: SNF    Current Diagnoses: Patient Active Problem List   Diagnosis Date Noted  . Hip fx, right, closed, initial encounter (HCC) 01/24/2021  . Right hip pain 01/24/2021  . Leukocytosis 01/24/2021  . Depression 01/24/2021  . Surgical counseling visit 12/10/2019  . History of esophageal stricture 11/02/2019  . Muscular deconditioning 11/01/2019  . Abnormal liver function tests   . Common bile duct calculus 10/16/2019  . Abnormal urinalysis 09/28/2019  . Adult failure to thrive syndrome 09/28/2019  . Dysphagia 09/06/2018  . Pain   . Closed fracture of femur, greater trochanter (HCC) 06/04/2018  . Hypoxia 06/03/2018  . Pain in right arm 04/10/2018  . Fall 03/31/2018  . Fracture of shaft of humerus 03/31/2018  . Disorder of skeletal muscle 03/31/2018  . Hypothermia 03/31/2018  . Healthcare maintenance 01/17/2018  . Nocturia 01/17/2018  . Macrocytosis 11/22/2017  . Acute bronchitis due to respiratory syncytial virus 11/09/2017  . Acute respiratory failure (HCC) 11/09/2017  . Hypertension 11/09/2017  . Paroxysmal atrial fibrillation (HCC) 11/09/2017  . Coronary artery disease 11/09/2017  . CKD (chronic kidney disease) stage 2, GFR 60-89 ml/min 11/09/2017  . Hypothyroidism 11/09/2017  . Dyspnea 11/08/2017  . Upper respiratory infection  11/08/2017  . Serum creatinine raised 06/21/2017  . Ankle edema 06/21/2017  . Diarrhea 05/25/2017  . Hypokalemia 05/25/2017  . Alcohol abuse 05/25/2017  . Recurrent depression (HCC) 03/30/2017  . Low back pain 03/30/2017  . Influenza 01/22/2017  . Coronary atherosclerosis 04/04/2015  . Hypertensive disorder 04/04/2015  . Hyperlipidemia 04/04/2015  . Ex-smoker 04/04/2015  . Chronic anticoagulation 04/04/2015    Orientation RESPIRATION BLADDER Height & Weight     Self,Time,Situation,Place  Normal Continent Weight: 142 lb 10.2 oz (64.7 kg) Height:  5\' 2"  (157.5 cm)  BEHAVIORAL SYMPTOMS/MOOD NEUROLOGICAL BOWEL NUTRITION STATUS      Continent    AMBULATORY STATUS COMMUNICATION OF NEEDS Skin   Limited Assist Verbally Surgical wounds                       Personal Care Assistance Level of Assistance  Bathing,Dressing Bathing Assistance: Limited assistance   Dressing Assistance: Limited assistance     Functional Limitations Info             SPECIAL CARE FACTORS FREQUENCY  PT (By licensed PT),OT (By licensed OT)                    Contractures Contractures Info: Present    Additional Factors Info  Code Status Code Status Info: FULL CODE             Current Medications (01/27/2021):  This is the current hospital active medication list Current Facility-Administered Medications  Medication Dose Route Frequency Provider Last Rate Last Admin  . acetaminophen (TYLENOL) tablet 650 mg  650 mg  Oral Q6H PRN Hartley Barefoot A, MD   650 mg at 01/27/21 0177   Or  . acetaminophen (TYLENOL) suppository 650 mg  650 mg Rectal Q6H PRN Regalado, Belkys A, MD      . acetaminophen (TYLENOL) tablet 325-650 mg  325-650 mg Oral Q6H PRN Babish, Matthew, PA-C      . buPROPion (WELLBUTRIN XL) 24 hr tablet 150 mg  150 mg Oral Daily Regalado, Belkys A, MD   150 mg at 01/27/21 1039  . docusate sodium (COLACE) capsule 100 mg  100 mg Oral BID Lanney Gins, PA-C   100 mg at  01/27/21 1039  . ferrous sulfate tablet 325 mg  325 mg Oral TID PC Lanney Gins, PA-C   325 mg at 01/27/21 1316  . HYDROcodone-acetaminophen (NORCO/VICODIN) 5-325 MG per tablet 1-2 tablet  1-2 tablet Oral Q4H PRN Lanney Gins, PA-C   1 tablet at 01/26/21 1008  . lactated ringers infusion   Intravenous Continuous Regalado, Belkys A, MD 45 mL/hr at 01/27/21 1532 Rate Change at 01/27/21 1532  . levothyroxine (SYNTHROID) tablet 75 mcg  75 mcg Oral QAC breakfast Regalado, Belkys A, MD   75 mcg at 01/27/21 9390  . menthol-cetylpyridinium (CEPACOL) lozenge 3 mg  1 lozenge Oral PRN Lanney Gins, PA-C       Or  . phenol (CHLORASEPTIC) mouth spray 1 spray  1 spray Mouth/Throat PRN Lanney Gins, PA-C      . methocarbamol (ROBAXIN) tablet 500 mg  500 mg Oral Q6H PRN Babish, Matthew, PA-C      . metoprolol tartrate (LOPRESSOR) tablet 12.5 mg  12.5 mg Oral BID Babish, Matthew, PA-C   12.5 mg at 01/27/21 1039  . morphine 2 MG/ML injection 0.5-1 mg  0.5-1 mg Intravenous Q2H PRN Babish, Molli Hazard, PA-C      . ondansetron [facility]) tablet 4 mg  4 mg Oral Q6H PRN Lanney Gins, PA-C       Or  . ondansetron (ZOFRAN) injection 4 mg  4 mg Intravenous Q6H PRN Lanney Gins, PA-C      . PARoxetine (PAXIL) tablet 40 mg  40 mg Oral Daily Regalado, Belkys A, MD   40 mg at 01/27/21 1039  . polyethylene glycol (MIRALAX / GLYCOLAX) packet 17 g  17 g Oral BID Regalado, Belkys A, MD   17 g at 01/27/21 1039  . Rivaroxaban (XARELTO) tablet 15 mg  15 mg Oral Q supper Lanney Gins, PA-C   15 mg at 01/26/21 1729  . senna (SENOKOT) tablet 8.6 mg  1 tablet Oral Daily Regalado, Belkys A, MD   8.6 mg at 01/27/21 1316  . simvastatin (ZOCOR) tablet 5 mg  5 mg Oral QPM Regalado, Belkys A, MD   5 mg at 01/26/21 1729  . vitamin B-12 (CYANOCOBALAMIN) tablet 100 mcg  100 mcg Oral Daily Regalado, Belkys A, MD   100 mcg at 01/27/21 1128     Discharge Medications: Please see discharge summary for a list of discharge  medications.  Relevant Imaging Results:  Relevant Lab Results:   Additional Information SS# [id number]  Dellie Burns [place], [place]

## 2021-01-27 NOTE — Discharge Instructions (Signed)

## 2021-01-27 NOTE — Plan of Care (Signed)

## 2021-01-27 NOTE — Progress Notes (Signed)
Physical Therapy Treatment Patient Details Name: Christina Ortiz MRN: [id number] DOB: 1929/02/20 Today's Date: 01/27/2021    History of Present Illness Patient is a 87 y/o female who presents with lright hip fx s/p fall in the snow now s/p right IM nailing right femur 01/25/21.  PMH includes PAF, bronchitis, HTN, depression, CKD, CAD, heart block, right humerus fx repair 03/2018.    PT Comments    Pt progressing towards goals. Continues to demonstrate difficulty maintaining weightbearing status. Requiring min A for short distance gait. Mod A for bed mobility. Educated about HEP as well. Current recommendations appropriate. Will continue to follow acutely.     Follow Up Recommendations  SNF;Supervision for mobility/OOB     Equipment Recommendations  None recommended by PT    Recommendations for Other Services       Precautions / Restrictions Precautions Precautions: Fall Restrictions Weight Bearing Restrictions: Yes RLE Weight Bearing: Partial weight bearing RLE Partial Weight Bearing Percentage or Pounds: 50    Mobility  Bed Mobility Overal bed mobility: Needs Assistance Bed Mobility: Supine to Sit     Supine to sit: Mod assist     General bed mobility comments: ASsist with trunk and RLE assist. Also required assist to scoot hips to EOB.  Transfers Overall transfer level: Needs assistance Equipment used: Rolling walker (2 wheeled) Transfers: Sit to/from Stand Sit to Stand: Min assist         General transfer comment: Required min A for lift assist and steadying assist. Increased time required. Cues for hand placement, however, pt continued to pull up on RW.  Ambulation/Gait Ambulation/Gait assistance: Min assist;Min guard Gait Distance (Feet): 25 Feet Assistive device: Rolling walker (2 wheeled) Gait Pattern/deviations: Step-to pattern;Decreased step length - right;Decreased step length - left;Decreased weight shift to right Gait velocity: Decreased   General  Gait Details: Mildly unsteady gait. Cues for pressing through RW to maintain WB status, however, pt had difficulty maintaining. Min to min guard A for steadying.   Stairs             Wheelchair Mobility    Modified Rankin (Stroke Patients Only)       Balance Overall balance assessment: Needs assistance Sitting-balance support: Feet supported Sitting balance-Leahy Scale: Fair     Standing balance support: Bilateral upper extremity supported;During functional activity Standing balance-Leahy Scale: Poor Standing balance comment: Requires UE support in standing.                            Cognition Arousal/Alertness: Awake/alert Behavior During Therapy: WFL for tasks assessed/performed Overall Cognitive Status: No family/caregiver present to determine baseline cognitive functioning                                 General Comments: Pt with drink spilled all over gown and table, but had not called for help to get cleaned up. Slow processing.      Exercises General Exercises - Lower Extremity Ankle Circles/Pumps: AROM;Both;10 reps;Supine Long Arc Quad: AROM;Both;10 reps;Seated    General Comments        Pertinent Vitals/Pain Pain Assessment: Faces Faces Pain Scale: Hurts even more Pain Location: right hip Pain Descriptors / Indicators: Sore;Operative site guarding Pain Intervention(s): Limited activity within patient's tolerance;Monitored during session;Repositioned    Home Living  Prior Function            PT Goals (current goals can now be found in the care plan section) Acute Rehab PT Goals Patient Stated Goal: to get better PT Goal Formulation: With patient Time For Goal Achievement: 02/09/21 Potential to Achieve Goals: Good Progress towards PT goals: Progressing toward goals    Frequency    Min 3X/week      PT Plan Current plan remains appropriate    Co-evaluation               AM-PAC PT "6 Clicks" Mobility   Outcome Measure  Help needed turning from your back to your side while in a flat bed without using bedrails?: A Little Help needed moving from lying on your back to sitting on the side of a flat bed without using bedrails?: A Lot Help needed moving to and from a bed to a chair (including a wheelchair)?: A Little Help needed standing up from a chair using your arms (e.g., wheelchair or bedside chair)?: A Little Help needed to walk in hospital room?: A Little Help needed climbing 3-5 steps with a railing? : Total 6 Click Score: 15    End of Session Equipment Utilized During Treatment: Gait belt Activity Tolerance: Patient tolerated treatment well Patient left: in chair;with call bell/phone within reach;with chair alarm set Nurse Communication: Mobility status PT Visit Diagnosis: Pain;Unsteadiness on feet (R26.81);Muscle weakness (generalized) (M62.81);Other abnormalities of gait and mobility (R26.89) Pain - Right/Left: Right Pain - part of body: Hip     Time: 0933-1000 PT Time Calculation (min) (ACUTE ONLY): 27 min  Charges:  $Gait Training: 8-22 mins $Therapeutic Activity: 8-22 mins                     Cindee Salt, DPT  Acute Rehabilitation Services  Pager: [phone] Office: [phone]    [name] 01/27/2021, 1:10 PM

## 2021-01-27 NOTE — Progress Notes (Signed)
PROGRESS NOTE    Christina Ortiz  ZOX:[id number] DOB: 03-Sep-1929 DOA: 01/24/2021 PCP: Mayer Masker, PA-C   Brief Narrative: 87 year old with past medical history significant for CAD status post PCI , stent placement to RCA and LAD 2006, paroxysmal atrial fibrillation on chronic anticoagulation with Xarelto, hypothyroidism, hypertension, and stage IIIa chronic kidney disease baseline creatinine 0.9--1.3 who presented to [facility] 01/24/2021 after a mechanical fall and found to have a right intertrochanteric hip fracture.  Evaluation in the ED: COVID-19 PCR negative, sodium 138, creatinine 0.9, x-ray right hip show evidence of acute comminuted right intertrochanteric hip fractures with varus angulation.  EKG no changes from prior EKG.  Orthopedic consulted. Patient underwent  intramedullary nail intertrochanteric on the right had a surgical intervention on 1/30.   She is awaiting SNF placement. Plan to follow WBC.    Assessment & Plan:   Principal Problem:   Hip fx, right, closed, initial encounter [facility]) Active Problems:   Chronic anticoagulation   Hypertension   Paroxysmal atrial fibrillation (HCC)   Right hip pain   Leukocytosis   Depression  1-Right Intertrochanteric hip fracture: -Underwent open reduction internal fixation of right intertrochanteric femur fracture nail on 1/30. Chales Abrahams score 4.7 -DVT prophylaxis; Back on Xarelto.  -Vitamin D level; 30.  -On miralax to prevent constipation.   2-Mechanical fall: She will  require rehab after surgery. WJ:XBJYNWGN leukocytes, nitrates.    3-Leukocytosis; Likely Reactive. On admission UA and chest x ray was negative for infection.  Initially resolved. WBC increase to 14 today. Afebrile. No evidence for infection.  Repeat labs in am.   4-PAF;  Back on xarelto.  Continue with   Metoprolol.   5-HTN;  Hold Quinapril and spironolactone due to AKI.  Resume norvasc if BP increases.    6-Hypothyroidism;   Continue with synthroid.   7-Depression; Continue with Wellbutrin and paxil.   8-AKI;  Cr increase to 1.5.  Baseline 0.9---1.3 Improved with fluids. Cr down to 1.2  Continue with IV fluids.   Increase MCV;  B 12 228/ , folic acid normal;.  Will start B 12 supplement.   Hemoconcentration; hb at 16 Could have been related to dehydration.  Patient does not smokes.  Improved.   Mild hyponatremia; continue with IV fluids.   Estimated body mass index is 26.09 kg/m as calculated from the following:   Height as of this encounter: 5\' 2"  (1.575 m).   Weight as of this encounter: 64.7 kg.   DVT prophylaxis: Xarelto Code Status: Full code Family Communication: daughter on 1/30---1/31.2/01 Disposition Plan:  Status is: Inpatient  Remains inpatient appropriate because:IV treatments appropriate due to intensity of illness or inability to take PO   Dispo: The patient is from: Home              Anticipated d/c is to: SNF probably               Anticipated d/c date is: 2 days              Patient currently is not medically stable to d/c.   Difficult to place patient No        Consultants:   ortho  Procedures:   SX  Antimicrobials:    Subjective: She is alert, she has not had BM since admission.  No cough.     Objective: Vitals:   01/27/21 0410 01/27/21 0500 01/27/21 0836 01/27/21 1427  BP: (!) 141/90  (!) 150/97 125/80  Pulse: 62  60 (!) 55  Resp: 16  17 17   Temp: 98.1 F (36.7 C)  (!) 97.5 F (36.4 C) (!) 97.3 F (36.3 C)  TempSrc:   Oral Oral  SpO2: 92%  95% 93%  Weight:  64.7 kg    Height:       No intake or output data in the 24 hours ending 01/27/21 1444 Filed Weights   01/24/21 1114 01/26/21 0500 01/27/21 0500  Weight: 57.2 kg 59.2 kg 64.7 kg    Examination:  General exam: NAD Respiratory system; CTA Cardiovascular system: S 1, S 2 RRR Gastrointestinal system: BS present, soft Central nervous system: alert Extremities: RLE with clean  dressing hip   Data Reviewed: I have personally reviewed following labs and imaging studies  CBC: Recent Labs  Lab 01/24/21 1246 01/24/21 2024 01/25/21 0254 01/26/21 0441 01/27/21 0334  WBC 12.4*  --  9.8 12.9* 14.2*  NEUTROABS 10.7*  --  8.2*  --   --   HGB 16.2* 16.3* 16.4* 13.6 12.2  HCT 49.4* 48.0* 48.7* 42.3 35.5*  MCV 102.1*  --  100.0 104.4* 100.6*  PLT 206  --  211 180 171   Basic Metabolic Panel: Recent Labs  Lab 01/24/21 1554 01/24/21 2024 01/25/21 0254 01/26/21 0441 01/27/21 0334  NA 138 137 140 138 134*  K 4.4 7.7* 4.3 4.6 4.0  CL 105 106 105 105 103  CO2 22  --  24 23 24   GLUCOSE 110* 115* 134* 153* 96  BUN 26* 45* 28* 40* 36*  CREATININE 0.97 0.90 1.14* 1.51* 1.16*  CALCIUM 9.2  --  9.4 8.7* 8.6*  MG  --   --  2.1  --   --    GFR: Estimated Creatinine Clearance: 29 mL/min (A) (by C-G formula based on SCr of 1.16 mg/dL (H)). Liver Function Tests: No results for input(s): AST, ALT, ALKPHOS, BILITOT, PROT, ALBUMIN in the last 168 hours. No results for input(s): LIPASE, AMYLASE in the last 168 hours. No results for input(s): AMMONIA in the last 168 hours. Coagulation Profile: Recent Labs  Lab 01/24/21 1246 01/25/21 0254  INR 1.5* 1.4*   Cardiac Enzymes: No results for input(s): CKTOTAL, CKMB, CKMBINDEX, TROPONINI in the last 168 hours. BNP (last 3 results) No results for input(s): PROBNP in the last 8760 hours. HbA1C: No results for input(s): HGBA1C in the last 72 hours. CBG: No results for input(s): GLUCAP in the last 168 hours. Lipid Profile: No results for input(s): CHOL, HDL, LDLCALC, TRIG, CHOLHDL, LDLDIRECT in the last 72 hours. Thyroid Function Tests: No results for input(s): TSH, T4TOTAL, FREET4, T3FREE, THYROIDAB in the last 72 hours. Anemia Panel: Recent Labs    01/27/21 0334  VITAMINB12 228  FOLATE 17.2   Sepsis Labs: No results for input(s): PROCALCITON, LATICACIDVEN in the last 168 hours.  Recent Results (from the past  240 hour(s))  SARS Coronavirus 2 by RT PCR (hospital order, performed in [facility] hospital lab) Nasopharyngeal Nasopharyngeal Swab     Status: None   Collection Time: 01/24/21  1:39 PM   Specimen: Nasopharyngeal Swab  Result Value Ref Range Status   SARS Coronavirus 2 NEGATIVE NEGATIVE Final    Comment: (NOTE) SARS-CoV-2 target nucleic acids are NOT DETECTED.  The SARS-CoV-2 RNA is generally detectable in upper and lower respiratory specimens during the acute phase of infection. The lowest concentration of SARS-CoV-2 viral copies this assay can detect is 250 copies / mL. A negative result does not preclude SARS-CoV-2 infection and should not be used as  the sole basis for treatment or other patient management decisions.  A negative result may occur with improper specimen collection / handling, submission of specimen other than nasopharyngeal swab, presence of viral mutation(s) within the areas targeted by this assay, and inadequate number of viral copies (<250 copies / mL). A negative result must be combined with clinical observations, patient history, and epidemiological information.  Fact Sheet for Patients:   BoilerBrush.com.cy  Fact Sheet for Healthcare Providers: https://pope.com/  This test is not yet approved or  cleared by the [place] FDA and has been authorized for detection and/or diagnosis of SARS-CoV-2 by FDA under an Emergency Use Authorization (EUA).  This EUA will remain in effect (meaning this test can be used) for the duration of the COVID-19 declaration under Section 564(b)(1) of the Act, 21 U.S.C. section 360bbb-3(b)(1), unless the authorization is terminated or revoked sooner.  Performed at [facility], [address] [place], [place] [address]          Radiology Studies: No results found.      Scheduled Meds: . buPROPion  150 mg Oral Daily  . docusate sodium  100 mg Oral BID  .  ferrous sulfate  325 mg Oral TID PC  . levothyroxine  75 mcg Oral QAC breakfast  . metoprolol tartrate  12.5 mg Oral BID  . PARoxetine  40 mg Oral Daily  . polyethylene glycol  17 g Oral BID  . Rivaroxaban  15 mg Oral Q supper  . senna  1 tablet Oral Daily  . simvastatin  5 mg Oral QPM  . vitamin B-12  100 mcg Oral Daily   Continuous Infusions: . lactated ringers 75 mL/hr at 01/27/21 1317     LOS: 3 days    Time spent: 35 minutes.     Alba Cory, MD [facility]   If 7PM-7AM, please contact night-coverage [email or web address]  01/27/2021, 2:44 PM

## 2021-01-27 NOTE — TOC Initial Note (Signed)
Transition of Care [facility]) - Initial/Assessment Note    Patient Details  Name: Christina Ortiz MRN: [id number] Date of Birth: 08-May-1929  Transition of Care [facility]) CM/SW Contact:    Deatra Robinson, [place] Phone Number: 01/27/2021, 4:21 PM  Clinical Narrative:  SW spoke to pt's dtr [name] re PT recommendation for SNF. Pt from home alone and dtr reports pt does not have 24/7 care and that she has discussed SNF with pt who is agreeable. Pt with previous SNF stays at [facility] and [facility]. Will begin SNF search and f/u with offers once available. Navi/[employer] auth submitted.   Dellie Burns, MSW, LCSW [phone] (coverage)                    Expected Discharge Plan: Skilled Nursing Facility Barriers to Discharge: Continued Medical Work up,No SNF bed   Patient Goals and CMS Choice   CMS Medicare.gov Compare Post Acute Care list provided to:: Patient Represenative (must comment) Choice offered to / list presented to : [facility] POA / Guardian  Expected Discharge Plan and Services Expected Discharge Plan: Skilled Nursing Facility       Living arrangements for the past 2 months: Single Family Home                                      Prior Living Arrangements/Services Living arrangements for the past 2 months: Single Family Home Lives with:: Self Patient language and need for interpreter reviewed:: No        Need for Family Participation in Patient Care: Yes (Comment) Care giver support system in place?: Yes (comment)      Activities of Daily Living      Permission Sought/Granted Permission sought to share information with : Oceanographer granted to share information with : Yes, Verbal Permission Granted              Emotional Assessment       Orientation: : Oriented to Self,Oriented to Place,Oriented to  Time,Oriented to Situation      Admission diagnosis:  Closed fracture of right hip, initial encounter (HCC)  [S72.001A] Hip fx, right, closed, initial encounter (HCC) [S72.001A] Patient Active Problem List   Diagnosis Date Noted  . Hip fx, right, closed, initial encounter (HCC) 01/24/2021  . Right hip pain 01/24/2021  . Leukocytosis 01/24/2021  . Depression 01/24/2021  . Surgical counseling visit 12/10/2019  . History of esophageal stricture 11/02/2019  . Muscular deconditioning 11/01/2019  . Abnormal liver function tests   . Common bile duct calculus 10/16/2019  . Abnormal urinalysis 09/28/2019  . Adult failure to thrive syndrome 09/28/2019  . Dysphagia 09/06/2018  . Pain   . Closed fracture of femur, greater trochanter (HCC) 06/04/2018  . Hypoxia 06/03/2018  . Pain in right arm 04/10/2018  . Fall 03/31/2018  . Fracture of shaft of humerus 03/31/2018  . Disorder of skeletal muscle 03/31/2018  . Hypothermia 03/31/2018  . Healthcare maintenance 01/17/2018  . Nocturia 01/17/2018  . Macrocytosis 11/22/2017  . Acute bronchitis due to respiratory syncytial virus 11/09/2017  . Acute respiratory failure (HCC) 11/09/2017  . Hypertension 11/09/2017  . Paroxysmal atrial fibrillation (HCC) 11/09/2017  . Coronary artery disease 11/09/2017  . CKD (chronic kidney disease) stage 2, GFR 60-89 ml/min 11/09/2017  . Hypothyroidism 11/09/2017  . Dyspnea 11/08/2017  . Upper respiratory infection 11/08/2017  . Serum creatinine raised 06/21/2017  .  Ankle edema 06/21/2017  . Diarrhea 05/25/2017  . Hypokalemia 05/25/2017  . Alcohol abuse 05/25/2017  . Recurrent depression (HCC) 03/30/2017  . Low back pain 03/30/2017  . Influenza 01/22/2017  . Coronary atherosclerosis 04/04/2015  . Hypertensive disorder 04/04/2015  . Hyperlipidemia 04/04/2015  . Ex-smoker 04/04/2015  . Chronic anticoagulation 04/04/2015   PCP:  Mayer Masker, PA-C Pharmacy:   [facility] - [place], [place] - [address] [place] [address] Phone: [phone] Fax:  [phone]     Social Determinants of Health (SDOH) Interventions    Readmission Risk Interventions No flowsheet data found.

## 2021-01-28 DIAGNOSIS — S72001A Fracture of unspecified part of neck of right femur, initial encounter for closed fracture: Secondary | ICD-10-CM | POA: Diagnosis not present

## 2021-01-28 LAB — BASIC METABOLIC PANEL
Anion gap: 9 (ref 5–15)
BUN: 26 mg/dL — ABNORMAL HIGH (ref 8–23)
CO2: 26 mmol/L (ref 22–32)
Calcium: 9 mg/dL (ref 8.9–10.3)
Chloride: 102 mmol/L (ref 98–111)
Creatinine, Ser: 0.84 mg/dL (ref 0.44–1.00)
GFR, Estimated: >60 mL/min (ref >60)
Glucose, Bld: 107 mg/dL — ABNORMAL HIGH (ref 70–99)
Potassium: 3.6 mmol/L (ref 3.5–5.1)
Sodium: 137 mmol/L (ref 135–145)

## 2021-01-28 LAB — CBC
HCT: 40.2 % (ref 36.0–46.0)
Hemoglobin: 13.9 g/dL (ref 12.0–15.0)
MCH: 34.3 pg — ABNORMAL HIGH (ref 26.0–34.0)
MCHC: 34.6 g/dL (ref 30.0–36.0)
MCV: 99.3 fL (ref 80.0–100.0)
Platelets: 178 10*3/uL (ref 150–400)
RBC: 4.05 MIL/uL (ref 3.87–5.11)
RDW: 12.6 % (ref 11.5–15.5)
WBC: 9.6 10*3/uL (ref 4.0–10.5)
nRBC: 0 % (ref 0.0–0.2)

## 2021-01-28 NOTE — Care Management Important Message (Signed)
Important Message  Patient Details  Name: Christina Ortiz MRN: [id number] Date of Birth: June 12, 1929   Medicare Important Message Given:  Yes     Shaleka Brines P Mayu Ronk 01/28/2021, 2:29 PM

## 2021-01-28 NOTE — Progress Notes (Signed)
Physical Therapy Treatment Patient Details Name: Christina Ortiz MRN: [id number] DOB: [birth year] Today's Date: 01/28/2021    History of Present Illness Patient is a 87 y/o female who presents with lright hip fx s/p fall in the snow now s/p right IM nailing right femur 01/25/21.  PMH includes PAF, bronchitis, HTN, depression, CKD, CAD, heart block, right humerus fx repair 03/2018.    PT Comments    Pt progressing towards goals. Did present with more unsteadiness this session requiring mod A for steadying during short distance gait. Pt also seemed to have more pain this session and was incontinent of stool which limited mobility. Per daughter, pt seems more delusional than her normal. Current recommendations appropriate. Will continue to follow acutely.     Follow Up Recommendations  SNF;Supervision for mobility/OOB     Equipment Recommendations  None recommended by PT    Recommendations for Other Services       Precautions / Restrictions Precautions Precautions: Fall Restrictions Weight Bearing Restrictions: Yes RLE Weight Bearing: Partial weight bearing RLE Partial Weight Bearing Percentage or Pounds: 50    Mobility  Bed Mobility Overal bed mobility: Needs Assistance Bed Mobility: Supine to Sit     Supine to sit: Mod assist     General bed mobility comments: ASsist with trunk and RLE assist. Also required assist to scoot hips to EOB.  Transfers Overall transfer level: Needs assistance Equipment used: Rolling walker (2 wheeled) Transfers: Sit to/from Stand Sit to Stand: Mod assist         General transfer comment: Mod A for lift assist and steadying. Cues for hand placement.  Ambulation/Gait Ambulation/Gait assistance: Min assist;Mod assist Gait Distance (Feet): 5 Feet Assistive device: Rolling walker (2 wheeled) Gait Pattern/deviations: Step-to pattern;Decreased step length - right;Decreased step length - left;Decreased weight shift to right Gait velocity:  Decreased   General Gait Details: Pt with increased instability noted this session. Slight posterior lean. Required min to mod A for steadying. Pt incontinent of stool during mobility which limited gait distance.   Stairs             Wheelchair Mobility    Modified Rankin (Stroke Patients Only)       Balance Overall balance assessment: Needs assistance Sitting-balance support: Feet supported Sitting balance-Leahy Scale: Fair     Standing balance support: Bilateral upper extremity supported;During functional activity Standing balance-Leahy Scale: Poor Standing balance comment: Requires UE support and external support in standing.                            Cognition Arousal/Alertness: Awake/alert Behavior During Therapy: WFL for tasks assessed/performed Overall Cognitive Status: Impaired/Different from baseline Area of Impairment: Attention;Memory;Following commands;Safety/judgement;Awareness;Problem solving                   Current Attention Level: Sustained Memory: Decreased recall of precautions;Decreased short-term memory Following Commands: Follows one step commands with increased time Safety/Judgement: Decreased awareness of deficits;Decreased awareness of safety Awareness: Intellectual Problem Solving: Slow processing;Difficulty sequencing;Decreased initiation General Comments: Pt's stating she was speaking with her husband, however, daughter reports he has been deceased for over 4 years. Per daughter, pt seems more delusional      Exercises      General Comments        Pertinent Vitals/Pain Pain Assessment: Faces Faces Pain Scale: Hurts even more Pain Location: right hip Pain Descriptors / Indicators: Sore;Operative site guarding Pain Intervention(s): Limited activity within patient's tolerance;Monitored during  session;Repositioned    Home Living                      Prior Function            PT Goals (current  goals can now be found in the care plan section) Acute Rehab PT Goals Patient Stated Goal: to get better PT Goal Formulation: With patient Time For Goal Achievement: 02/09/21 Potential to Achieve Goals: Good Progress towards PT goals: Progressing toward goals    Frequency    Min 3X/week      PT Plan Current plan remains appropriate    Co-evaluation              AM-PAC PT "6 Clicks" Mobility   Outcome Measure  Help needed turning from your back to your side while in a flat bed without using bedrails?: A Little Help needed moving from lying on your back to sitting on the side of a flat bed without using bedrails?: A Lot Help needed moving to and from a bed to a chair (including a wheelchair)?: A Lot Help needed standing up from a chair using your arms (e.g., wheelchair or bedside chair)?: A Lot Help needed to walk in hospital room?: A Lot Help needed climbing 3-5 steps with a railing? : Total 6 Click Score: 12    End of Session Equipment Utilized During Treatment: Gait belt Activity Tolerance: Patient tolerated treatment well Patient left: in chair;with call bell/phone within reach;with chair alarm set;with family/visitor present Nurse Communication: Mobility status PT Visit Diagnosis: Pain;Unsteadiness on feet (R26.81);Muscle weakness (generalized) (M62.81);Other abnormalities of gait and mobility (R26.89) Pain - Right/Left: Right Pain - part of body: Hip     Time: 1275-1700 PT Time Calculation (min) (ACUTE ONLY): 25 min  Charges:  $Therapeutic Activity: 23-37 mins                     Cindee Salt, DPT  Acute Rehabilitation Services  Pager: [phone] Office: [phone]    Christina Ortiz 01/28/2021, 4:46 PM

## 2021-01-28 NOTE — NC FL2 (Addendum)
[place] MEDICAID FL2 LEVEL OF CARE SCREENING TOOL     IDENTIFICATION  Patient Name: Christina Ortiz Birthdate: 1929-10-30 Sex: female Admission Date (Current Location): 01/24/2021  [facility] and [place] Number:  Producer, television/film/video and Address:  [facility] [place], [address] [place], [place] [address]      Provider Number: [id number]  Attending Physician Name and Address:  Albertine Grates, MD  Relative Name and Phone Number:       Current Level of Care: Hospital Recommended Level of Care: Skilled Nursing Facility Prior Approval Number:    Date Approved/Denied:   PASRR Number: [id number] E expires 02/28/21  Discharge Plan: SNF    Current Diagnoses: Patient Active Problem List   Diagnosis Date Noted   Hip fx, right, closed, initial encounter (HCC) 01/24/2021   Right hip pain 01/24/2021   Leukocytosis 01/24/2021   Depression 01/24/2021   Surgical counseling visit 12/10/2019   History of esophageal stricture 11/02/2019   Muscular deconditioning 11/01/2019   Abnormal liver function tests    Common bile duct calculus 10/16/2019   Abnormal urinalysis 09/28/2019   Adult failure to thrive syndrome 09/28/2019   Dysphagia 09/06/2018   Pain    Closed fracture of femur, greater trochanter (HCC) 06/04/2018   Hypoxia 06/03/2018   Pain in right arm 04/10/2018   Fall 03/31/2018   Fracture of shaft of humerus 03/31/2018   Disorder of skeletal muscle 03/31/2018   Hypothermia 03/31/2018   Healthcare maintenance 01/17/2018   Nocturia 01/17/2018   Macrocytosis 11/22/2017   Acute bronchitis due to respiratory syncytial virus 11/09/2017   Acute respiratory failure (HCC) 11/09/2017   Hypertension 11/09/2017   Paroxysmal atrial fibrillation (HCC) 11/09/2017   Coronary artery disease 11/09/2017   CKD (chronic kidney disease) stage 2, GFR 60-89 ml/min 11/09/2017   Hypothyroidism 11/09/2017   Dyspnea 11/08/2017   Upper respiratory  infection 11/08/2017   Serum creatinine raised 06/21/2017   Ankle edema 06/21/2017   Diarrhea 05/25/2017   Hypokalemia 05/25/2017   Alcohol abuse 05/25/2017   Recurrent depression (HCC) 03/30/2017   Low back pain 03/30/2017   Influenza 01/22/2017   Coronary atherosclerosis 04/04/2015   Hypertensive disorder 04/04/2015   Hyperlipidemia 04/04/2015   Ex-smoker 04/04/2015   Chronic anticoagulation 04/04/2015    Orientation RESPIRATION BLADDER Height & Weight     Self,Time  Normal Continent Weight: 134 lb 7.7 oz (61 kg) Height:  5\' 2"  (157.5 cm)  BEHAVIORAL SYMPTOMS/MOOD NEUROLOGICAL BOWEL NUTRITION STATUS      Continent    AMBULATORY STATUS COMMUNICATION OF NEEDS Skin   Limited Assist Verbally  (closed incision on leg)                       Personal Care Assistance Level of Assistance  Bathing,Dressing Bathing Assistance: Limited assistance   Dressing Assistance: Limited assistance     Functional Limitations Info             SPECIAL CARE FACTORS FREQUENCY  PT (By licensed PT),OT (By licensed OT)     PT Frequency: 5x/week OT Frequency: 5x/week            Contractures Contractures Info: Not present    Additional Factors Info  Code Status,Allergies,Psychotropic Code Status Info: Full Allergies Info: NKA Psychotropic Info: Wellbutrin, Paxil         Current Medications (01/28/2021):  This is the current hospital active medication list Current Facility-Administered Medications  Medication Dose Route Frequency Provider Last Rate Last Admin  acetaminophen (TYLENOL) tablet 650 mg  650 mg Oral Q6H PRN Regalado, Belkys A, MD   650 mg at 01/27/21 2114   Or   acetaminophen (TYLENOL) suppository 650 mg  650 mg Rectal Q6H PRN Regalado, Belkys A, MD       acetaminophen (TYLENOL) tablet 325-650 mg  325-650 mg Oral Q6H PRN Babish, Matthew, PA-C       buPROPion (WELLBUTRIN XL) 24 hr tablet 150 mg  150 mg Oral Daily Regalado, Belkys A, MD   150 mg at  01/28/21 0844   docusate sodium (COLACE) capsule 100 mg  100 mg Oral BID Lanney Gins, PA-C   100 mg at 01/28/21 6063   ferrous sulfate tablet 325 mg  325 mg Oral TID PC Lanney Gins, PA-C   325 mg at 01/28/21 1208   HYDROcodone-acetaminophen (NORCO/VICODIN) 5-325 MG per tablet 1-2 tablet  1-2 tablet Oral Q4H PRN Lanney Gins, PA-C   1 tablet at 01/26/21 1008   lactated ringers infusion   Intravenous Continuous Regalado, Belkys A, MD 45 mL/hr at 01/27/21 1532 Rate Change at 01/27/21 1532   levothyroxine (SYNTHROID) tablet 75 mcg  75 mcg Oral QAC breakfast Regalado, Belkys A, MD   75 mcg at 01/28/21 0160   menthol-cetylpyridinium (CEPACOL) lozenge 3 mg  1 lozenge Oral PRN Lanney Gins, PA-C       Or   phenol (CHLORASEPTIC) mouth spray 1 spray  1 spray Mouth/Throat PRN Lanney Gins, PA-C       methocarbamol (ROBAXIN) tablet 500 mg  500 mg Oral Q6H PRN Babish, Matthew, PA-C       metoprolol tartrate (LOPRESSOR) tablet 12.5 mg  12.5 mg Oral BID Babish, Matthew, PA-C   12.5 mg at 01/28/21 0844   morphine 2 MG/ML injection 0.5-1 mg  0.5-1 mg Intravenous Q2H PRN Lanney Gins, PA-C       ondansetron [facility]) tablet 4 mg  4 mg Oral Q6H PRN Lanney Gins, PA-C       Or   ondansetron [facility]) injection 4 mg  4 mg Intravenous Q6H PRN Babish, Matthew, PA-C       PARoxetine (PAXIL) tablet 40 mg  40 mg Oral Daily Regalado, Belkys A, MD   40 mg at 01/28/21 0844   polyethylene glycol (MIRALAX / GLYCOLAX) packet 17 g  17 g Oral BID Regalado, Belkys A, MD   17 g at 01/28/21 0844   Rivaroxaban (XARELTO) tablet 15 mg  15 mg Oral Q supper Babish, Matthew, PA-C   15 mg at 01/27/21 1751   senna (SENOKOT) tablet 8.6 mg  1 tablet Oral Daily Regalado, Belkys A, MD   8.6 mg at 01/28/21 0844   simvastatin (ZOCOR) tablet 5 mg  5 mg Oral QPM Regalado, Belkys A, MD   5 mg at 01/27/21 1751   vitamin B-12 (CYANOCOBALAMIN) tablet 100 mcg  100 mcg Oral Daily Regalado, Belkys A, MD   100 mcg at  01/28/21 1093     Discharge Medications: Please see discharge summary for a list of discharge medications.  Relevant Imaging Results:  Relevant Lab Results:   Additional Information SS# [id number]. Pfizer vaccines 02/09/2020 , 01/19/2020  Mearl Latin, LCSW

## 2021-01-28 NOTE — Progress Notes (Signed)
     Subjective: 3 Days Post-Op Procedure(s) (LRB): INTRAMEDULLARY (IM) NAIL INTERTROCHANTRIC (Right)   Patient reports pain as mild, no reported events throughout the night.     Objective:   VITALS:    01/27/21   BP: (!) 141/90  Pulse: 62  Resp: 16  Temp: 98.1 F (36.7 C)  SpO2: 92%    Dorsiflexion/Plantar flexion intact Incision: dressing C/D/I No cellulitis present Compartment soft  LABS   01/26/21 0441 01/27/21 0334  HGB 13.6 12.2  HCT 42.3 35.5*  WBC 12.9* 14.2*  PLT 180 171      01/26/21 0441 01/27/21 0334  NA 138 134*  K 4.6 4.0  BUN 40* 36*  CREATININE 1.51* 1.16*  GLUCOSE 153* 96     Assessment/Plan: 3 Days Post-Op Procedure(s) (LRB): INTRAMEDULLARY (IM) NAIL INTERTROCHANTRIC (Right) Up with therapy Discharge disposition TBD   Ortho recommendations:  Xarelto for anticoagulation, on pre-op  Norco for pain management (Rx written).  MiraLax and Colace for constipation  Iron 325 mg tid for 2-3 weeks   PWB 50% on the right leg.  Dressing to remain in place until follow in clinic in 2 weeks.  Dressing is waterproof and may shower with it in place.  Follow up in 2 weeks at [facility]. Follow up with OLIN,[place] Freyre D in 2 weeks.  Contact information:  [facility] [address] [place] [address] [phone]             Lanney Gins PA-C  [facility]  [place] [address], [address], [place], [place] [address] Phone: [phone] [email or web address] Facebook  [employer]

## 2021-01-28 NOTE — Progress Notes (Signed)
Pt pulled out her IV. 

## 2021-01-28 NOTE — Progress Notes (Signed)
PROGRESS NOTE    Christina Ortiz  ZHG:[id number] DOB: 11/13/1929 DOA: 01/24/2021 PCP: Mayer Masker, PA-C    Chief Complaint  Patient presents with  . Fall    Brief Narrative:  87 year old with past medical history significant for CAD status post PCI , stent placement to RCA and LAD 2006, paroxysmal atrial fibrillation on chronic anticoagulation with Xarelto, hypothyroidism, hypertension, and stage IIIa chronic kidney disease baseline creatinine 0.9--1.3 who presented to [facility] 01/24/2021 after a mechanical fall and found to have a right intertrochanteric hip fracture.  Orthopedic consulted. Patient underwent  intramedullary nail intertrochanteric on the right had a surgical intervention on 1/30.   She is awaiting SNF placement.  Subjective:  No interval changes, denies pain at rest, awaiting for skilled nursing facility placement  Assessment & Plan:   Principal Problem:   Hip fx, right, closed, initial encounter [facility] [place] [facility]) Active Problems:   Chronic anticoagulation   Hypertension   Paroxysmal atrial fibrillation (HCC)   Right hip pain   Leukocytosis   Depression   Right Intertrochanteric hip fracture: -Underwent open reduction internal fixation of right intertrochanteric femur fracture nail on 1/30. --DVT prophylaxis; Back on Xarelto.  -Vitamin D level; 30.  -On miralax to prevent constipation.   Mechanical fall: She will  require rehab after surgery. HD:QQIWLNLG leukocytes, nitrates.    Leukocytosis/hyponatremia/hemoconcentration/AKI on CKD 2;  On admission UA and chest x ray was negative for infection.  She received hydration WBC peaked at 14.2, normalized Labs normalized Continue hydration for another 24 hours  Macrocytosis B12 228, folic acid normal She is started on B12 supplement  PAF;  Back on xarelto.  Continue with   Metoprolol.   HTN;  Hold Quinapril and spironolactone due to AKI.  Resume norvasc if BP increases.     Hypothyroidism;  Continue with synthroid.   Depression; Continue with Wellbutrin and paxil.      DVT prophylaxis: SCDs Start: 01/25/21 1638 Place TED hose Start: 01/25/21 1638 SCDs Start: 01/24/21 1837 Rivaroxaban (XARELTO) tablet 15 mg   Code Status: Full Family Communication: Patient Disposition:   Status is: Inpatient   Dispo: The patient is from: Home              Anticipated d/c is to: Skilled nursing facility              Anticipated d/c date is: Medically ready to discharge, awaiting for a bed                Consultants:   Orthopedics  Procedures:  Underwent open reduction internal fixation of right intertrochanteric femur fracture nail on 1/30.    Antimicrobials:   Perioperative     Objective: Vitals:   01/28/21 0500 01/28/21 0503 01/28/21 0700 01/28/21 1500  BP:  (!) 173/96 (!) 156/99 (!) 146/82  Pulse:  63 64 73  Resp:  17 17 16   Temp:  (!) 97.3 F (36.3 C) 98.2 F (36.8 C) 97.8 F (36.6 C)  TempSrc:  Oral Oral Oral  SpO2:  95% 94% 96%  Weight: 61 kg 61 kg    Height:       No intake or output data in the 24 hours ending 01/28/21 1850 Filed Weights   01/27/21 0500 01/28/21 0500 01/28/21 0503  Weight: 64.7 kg 61 kg 61 kg    Examination:  General exam: Frail elderly , calm, NAD Respiratory system: Clear to auscultation. Respiratory effort normal. Cardiovascular system: S1 & S2 heard, RRR. No JVD, no murmur, No pedal  edema. Gastrointestinal system: Abdomen is nondistended, soft and nontender.  Normal bowel sounds heard. Central nervous system: Alert and oriented. No focal neurological deficits. Extremities: Postop changes right hip, neurovascular intact distally Skin: No rashes, lesions or ulcers Psychiatry: Judgement and insight appear normal. Mood & affect appropriate.     Data Reviewed: I have personally reviewed following labs and imaging studies  CBC: Recent Labs  Lab 01/24/21 1246 01/24/21 2024 01/25/21 0254  01/26/21 0441 01/27/21 0334 01/28/21 0336  WBC 12.4*  --  9.8 12.9* 14.2* 9.6  NEUTROABS 10.7*  --  8.2*  --   --   --   HGB 16.2* 16.3* 16.4* 13.6 12.2 13.9  HCT 49.4* 48.0* 48.7* 42.3 35.5* 40.2  MCV 102.1*  --  100.0 104.4* 100.6* 99.3  PLT 206  --  211 180 171 178    Basic Metabolic Panel: Recent Labs  Lab 01/24/21 1554 01/24/21 2024 01/25/21 0254 01/26/21 0441 01/27/21 0334 01/28/21 0336  NA 138 137 140 138 134* 137  K 4.4 7.7* 4.3 4.6 4.0 3.6  CL 105 106 105 105 103 102  CO2 22  --  24 23 24 26   GLUCOSE 110* 115* 134* 153* 96 107*  BUN 26* 45* 28* 40* 36* 26*  CREATININE 0.97 0.90 1.14* 1.51* 1.16* 0.84  CALCIUM 9.2  --  9.4 8.7* 8.6* 9.0  MG  --   --  2.1  --   --   --     GFR: Estimated Creatinine Clearance: 39.1 mL/min (by C-G formula based on SCr of 0.84 mg/dL).  Liver Function Tests: No results for input(s): AST, ALT, ALKPHOS, BILITOT, PROT, ALBUMIN in the last 168 hours.  CBG: No results for input(s): GLUCAP in the last 168 hours.   Recent Results (from the past 240 hour(s))  SARS Coronavirus 2 by RT PCR (hospital order, performed in [facility] hospital lab) Nasopharyngeal Nasopharyngeal Swab     Status: None   Collection Time: 01/24/21  1:39 PM   Specimen: Nasopharyngeal Swab  Result Value Ref Range Status   SARS Coronavirus 2 NEGATIVE NEGATIVE Final    Comment: (NOTE) SARS-CoV-2 target nucleic acids are NOT DETECTED.  The SARS-CoV-2 RNA is generally detectable in upper and lower respiratory specimens during the acute phase of infection. The lowest concentration of SARS-CoV-2 viral copies this assay can detect is 250 copies / mL. A negative result does not preclude SARS-CoV-2 infection and should not be used as the sole basis for treatment or other patient management decisions.  A negative result may occur with improper specimen collection / handling, submission of specimen other than nasopharyngeal swab, presence of viral mutation(s) within  the areas targeted by this assay, and inadequate number of viral copies (<250 copies / mL). A negative result must be combined with clinical observations, patient history, and epidemiological information.  Fact Sheet for Patients:   01/26/21  Fact Sheet for Healthcare Providers: BoilerBrush.com.cy  This test is not yet approved or  cleared by the https://pope.com/ FDA and has been authorized for detection and/or diagnosis of SARS-CoV-2 by FDA under an Emergency Use Authorization (EUA).  This EUA will remain in effect (meaning this test can be used) for the duration of the COVID-19 declaration under Section 564(b)(1) of the Act, 21 U.S.C. section 360bbb-3(b)(1), unless the authorization is terminated or revoked sooner.  Performed at [facility], [address] [place], [place]          Radiology Studies: No results  found.      Scheduled Meds: . buPROPion  150 mg Oral Daily  . docusate sodium  100 mg Oral BID  . ferrous sulfate  325 mg Oral TID PC  . levothyroxine  75 mcg Oral QAC breakfast  . metoprolol tartrate  12.5 mg Oral BID  . PARoxetine  40 mg Oral Daily  . polyethylene glycol  17 g Oral BID  . Rivaroxaban  15 mg Oral Q supper  . senna  1 tablet Oral Daily  . simvastatin  5 mg Oral QPM  . vitamin B-12  100 mcg Oral Daily   Continuous Infusions: . lactated ringers 45 mL/hr at 01/27/21 1532     LOS: 4 days   Time spent: Greater than 50% of this time was spent in counseling, explanation of diagnosis, planning of further management, and coordination of care.  I have personally reviewed and interpreted on  01/28/2021 daily labs,I reviewed all nursing notes, pharmacy notes, consultant notes,  vitals, pertinent old records  I have discussed plan of care as described above with RN , patient  on 01/28/2021  Voice Recognition /Dragon dictation system was used to create this note,  attempts have been made to correct errors. Please contact the author with questions and/or clarifications.   Albertine Grates, MD PhD FACP [facility]  Available via Epic secure chat 7am-7pm for nonurgent issues Please page for urgent issues To page the attending provider between 7A-7P or the covering provider during after hours 7P-7A, please log into the web site [email or web address] and access using universal [place] password for that web site. If you do not have the password, please call the hospital operator.    01/28/2021, 6:50 PM

## 2021-01-28 NOTE — Plan of Care (Signed)
  Problem: Pain Managment: Goal: General experience of comfort will improve Outcome: Progressing   Problem: Safety: Goal: Ability to remain free from injury will improve Outcome: Progressing   Problem: Skin Integrity: Goal: Risk for impaired skin integrity will decrease Outcome: Progressing   Problem: Education: Goal: Knowledge of General Education information will improve Description: Including pain rating scale, medication(s)/side effects and non-pharmacologic comfort measures Outcome: Not Progressing   Problem: Health Behavior/Discharge Planning: Goal: Ability to manage health-related needs will improve Outcome: Not Progressing   Problem: Activity: Goal: Risk for activity intolerance will decrease Outcome: Not Progressing   Problem: Nutrition: Goal: Adequate nutrition will be maintained Outcome: Not Progressing   

## 2021-01-28 NOTE — Progress Notes (Signed)
RE: Christina Ortiz Date of Birth: 11/19/1929 Date:  01/28/21  Please be advised that the above-named patient will require a short-term nursing home stay - anticipated 30 days or less for rehabilitation and strengthening.  The plan is for return home.

## 2021-01-29 DIAGNOSIS — S72001A Fracture of unspecified part of neck of right femur, initial encounter for closed fracture: Secondary | ICD-10-CM | POA: Diagnosis not present

## 2021-01-29 LAB — SARS CORONAVIRUS 2 (TAT 6-24 HRS): SARS Coronavirus 2: NEGATIVE

## 2021-01-29 NOTE — Progress Notes (Signed)
Physical Therapy Treatment Patient Details Name: Christina Ortiz MRN: [id number] DOB: 08-25-1929 Today's Date: 01/29/2021    History of Present Illness Patient is a 87 y/o female who presents with lright hip fx s/p fall in the snow now s/p right IM nailing right femur 01/25/21.  PMH includes PAF, bronchitis, HTN, depression, CKD, CAD, heart block, right humerus fx repair 03/2018.    PT Comments    Patient requires minA for sit to stand and ambulation with RW. Patient ambulated 53' with RW, reports of fatigue following short ambulation distance. Patient with slow processing and required cues for sequencing of tasks. Patient continues to present with generalized weakness, decreased activity tolerance, impaired balance, and impaired functional mobility. Continue to recommend SNF for ongoing Physical Therapy.      Follow Up Recommendations  SNF;Supervision for mobility/OOB     Equipment Recommendations  None recommended by PT    Recommendations for Other Services       Precautions / Restrictions Precautions Precautions: Fall Restrictions Weight Bearing Restrictions: Yes RLE Weight Bearing: Partial weight bearing RLE Partial Weight Bearing Percentage or Pounds: 50    Mobility  Bed Mobility               General bed mobility comments: in recliner on arrival  Transfers Overall transfer level: Needs assistance Equipment used: Rolling Mirai Greenwood (2 wheeled) Transfers: Sit to/from Stand Sit to Stand: Min assist         General transfer comment: minA for boost up and steadying due to posterior lean initially, cues for hand placement  Ambulation/Gait Ambulation/Gait assistance: Min assist Gait Distance (Feet): 25 Feet Assistive device: Rolling Melecio Cueto (2 wheeled) Gait Pattern/deviations: Step-to pattern;Decreased step length - right;Decreased step length - left;Decreased weight shift to right Gait velocity: Decreased   General Gait Details: Cues and assist required for RW  management as patient has tendency to push RW out too far in front. Patient intermittently able to maintain PWB on RLE   Stairs             Wheelchair Mobility    Modified Rankin (Stroke Patients Only)       Balance Overall balance assessment: Needs assistance Sitting-balance support: Feet supported Sitting balance-Leahy Scale: Fair     Standing balance support: Bilateral upper extremity supported;During functional activity Standing balance-Leahy Scale: Poor Standing balance comment: Requires UE support and external support in standing.                            Cognition Arousal/Alertness: Awake/alert Behavior During Therapy: WFL for tasks assessed/performed Overall Cognitive Status: Impaired/Different from baseline Area of Impairment: Attention;Memory;Following commands;Safety/judgement;Awareness;Problem solving                   Current Attention Level: Sustained Memory: Decreased recall of precautions;Decreased short-term memory Following Commands: Follows one step commands with increased time Safety/Judgement: Decreased awareness of deficits;Decreased awareness of safety Awareness: Intellectual Problem Solving: Slow processing;Difficulty sequencing;Decreased initiation        Exercises General Exercises - Lower Extremity Quad Sets: Both;10 reps (long sitting) Hip Flexion/Marching: Both;10 reps    General Comments        Pertinent Vitals/Pain Pain Assessment: Faces Faces Pain Scale: Hurts little more Pain Location: right hip Pain Descriptors / Indicators: Sore;Operative site guarding Pain Intervention(s): Monitored during session;Repositioned    Home Living  Prior Function            PT Goals (current goals can now be found in the care plan section) Acute Rehab PT Goals Patient Stated Goal: to get better PT Goal Formulation: With patient Time For Goal Achievement: 02/09/21 Potential to Achieve  Goals: Good Progress towards PT goals: Progressing toward goals    Frequency    Min 3X/week      PT Plan Current plan remains appropriate    Co-evaluation              AM-PAC PT "6 Clicks" Mobility   Outcome Measure  Help needed turning from your back to your side while in a flat bed without using bedrails?: A Little Help needed moving from lying on your back to sitting on the side of a flat bed without using bedrails?: A Little Help needed moving to and from a bed to a chair (including a wheelchair)?: A Little Help needed standing up from a chair using your arms (e.g., wheelchair or bedside chair)?: A Little Help needed to walk in hospital room?: A Little Help needed climbing 3-5 steps with a railing? : A Lot 6 Click Score: 17    End of Session Equipment Utilized During Treatment: Gait belt Activity Tolerance: Patient tolerated treatment well Patient left: in chair;with call bell/phone within reach;with chair alarm set;with family/visitor present Nurse Communication: Mobility status PT Visit Diagnosis: Pain;Unsteadiness on feet (R26.81);Muscle weakness (generalized) (M62.81);Other abnormalities of gait and mobility (R26.89) Pain - Right/Left: Right Pain - part of body: Hip     Time: 1443-1540 PT Time Calculation (min) (ACUTE ONLY): 24 min  Charges:  $Therapeutic Activity: 23-37 mins                     Shealynn Saulnier A. Dan Humphreys PT, DPT Acute Rehabilitation Services Pager [phone] Office [phone]    Elissa Lovett 01/29/2021, 5:20 PM

## 2021-01-29 NOTE — TOC Progression Note (Signed)
Transition of Care [facility]) - Progression Note    Patient Details  Name: Christina Ortiz MRN: [id number] Date of Birth: July 08, 1929  Transition of [facility] CM/SW Contact  Carley Hammed, LCSWA Phone Number: 01/29/2021, 12:12 PM  Clinical Narrative:    CSW confirmed with dtr that she would like for pt to go to [facility]. Tracey with [facility] noted they would have a bed for her tomorrow. Covid test was ordered. Authorization received  Reference Number [id number]. Will follow up in the morning to DC to [facility].   Expected Discharge Plan: Skilled Nursing Facility Barriers to Discharge: Continued Medical Work up,No SNF bed  Expected Discharge Plan and Services Expected Discharge Plan: Skilled Nursing Facility       Living arrangements for the past 2 months: Single Family Home                                       Social Determinants of Health (SDOH) Interventions    Readmission Risk Interventions No flowsheet data found.

## 2021-01-29 NOTE — Progress Notes (Signed)
PROGRESS NOTE    Christina Ortiz  XIP:[id number] DOB: [birth year] DOA: 01/24/2021 PCP: Mayer Masker, PA-C    Chief Complaint  Patient presents with  . Fall    Brief Narrative:  87 year old with past medical history significant for CAD status post PCI , stent placement to RCA and LAD 2006, paroxysmal atrial fibrillation on chronic anticoagulation with Xarelto, hypothyroidism, hypertension, and stage IIIa chronic kidney disease baseline creatinine 0.9--1.3 who presented to [facility] 01/24/2021 after a mechanical fall and found to have a right intertrochanteric hip fracture.  Orthopedic consulted. Patient underwent  intramedullary nail intertrochanteric on the right had a surgical intervention on 1/30.   She is awaiting SNF placement.  Subjective:  No interval changes, denies pain at rest,  Medically stable to discharge, awaiting for skilled nursing facility placement  Assessment & Plan:   Principal Problem:   Hip fx, right, closed, initial encounter [facility]) Active Problems:   Chronic anticoagulation   Hypertension   Paroxysmal atrial fibrillation (HCC)   Right hip pain   Leukocytosis   Depression   Right Intertrochanteric hip fracture: -Underwent open reduction internal fixation of right intertrochanteric femur fracture nail on 1/30. --DVT prophylaxis; Back on Xarelto.  -Vitamin D level; 30.  -On miralax to prevent constipation.   Mechanical fall: She will  require rehab after surgery. QB:HALPFXTK leukocytes, nitrates.    Leukocytosis/hyponatremia/hemoconcentration/AKI on CKD 2;  On admission UA and chest x ray was negative for infection.  She received hydration WBC peaked at 14.2, normalized Labs normalized Continue hydration for another 24 hours  Macrocytosis B12 228, folic acid normal She is started on B12 supplement  PAF;  Back on xarelto.  Continue with   Metoprolol.   HTN;  Hold Quinapril and spironolactone due to AKI.  Resume  norvasc if BP increases.    Hypothyroidism;  Continue with synthroid.   Depression; Continue with Wellbutrin and paxil.      DVT prophylaxis: SCDs Start: 01/25/21 1638 Place TED hose Start: 01/25/21 1638 SCDs Start: 01/24/21 1837 Rivaroxaban (XARELTO) tablet 15 mg   Code Status: Full Family Communication: Patient Disposition:   Status is: Inpatient   Dispo: The patient is from: Home              Anticipated d/c is to: Skilled nursing facility              Anticipated d/c date is: Medically ready to discharge, awaiting for a bed                Consultants:   Orthopedics  Procedures:  Underwent open reduction internal fixation of right intertrochanteric femur fracture nail on 1/30.    Antimicrobials:   Perioperative     Objective: Vitals:   01/28/21 1500 01/28/21 2033 01/29/21 0427 01/29/21 0814  BP: (!) 146/82 (!) 154/94 (!) 147/93 139/84  Pulse: 73 76 71 79  Resp: 16 18 20 16   Temp: 97.8 F (36.6 C) 97.6 F (36.4 C) 97.7 F (36.5 C) 97.8 F (36.6 C)  TempSrc: Oral Axillary Oral Oral  SpO2: 96% 95% 97% 93%  Weight:      Height:        Intake/Output Summary (Last 24 hours) at 01/29/2021 1428 Last data filed at 01/29/2021 0900 Gross per 24 hour  Intake 240 ml  Output -  Net 240 ml   Filed Weights   01/27/21 0500 01/28/21 0500 01/28/21 0503  Weight: 64.7 kg 61 kg 61 kg    Examination:  General exam: Frail  elderly , calm, NAD Respiratory system: Clear to auscultation. Respiratory effort normal. Cardiovascular system: S1 & S2 heard, RRR. No JVD, no murmur, No pedal edema. Gastrointestinal system: Abdomen is nondistended, soft and nontender.  Normal bowel sounds heard. Central nervous system: Alert and oriented. No focal neurological deficits. Extremities: Postop changes right hip, neurovascular intact distally Skin: No rashes, lesions or ulcers Psychiatry: Judgement and insight appear normal. Mood & affect appropriate.     Data Reviewed:  I have personally reviewed following labs and imaging studies  CBC: Recent Labs  Lab 01/24/21 1246 01/24/21 2024 01/25/21 0254 01/26/21 0441 01/27/21 0334 01/28/21 0336  WBC 12.4*  --  9.8 12.9* 14.2* 9.6  NEUTROABS 10.7*  --  8.2*  --   --   --   HGB 16.2* 16.3* 16.4* 13.6 12.2 13.9  HCT 49.4* 48.0* 48.7* 42.3 35.5* 40.2  MCV 102.1*  --  100.0 104.4* 100.6* 99.3  PLT 206  --  211 180 171 178    Basic Metabolic Panel: Recent Labs  Lab 01/24/21 1554 01/24/21 2024 01/25/21 0254 01/26/21 0441 01/27/21 0334 01/28/21 0336  NA 138 137 140 138 134* 137  K 4.4 7.7* 4.3 4.6 4.0 3.6  CL 105 106 105 105 103 102  CO2 22  --  24 23 24 26   GLUCOSE 110* 115* 134* 153* 96 107*  BUN 26* 45* 28* 40* 36* 26*  CREATININE 0.97 0.90 1.14* 1.51* 1.16* 0.84  CALCIUM 9.2  --  9.4 8.7* 8.6* 9.0  MG  --   --  2.1  --   --   --     GFR: Estimated Creatinine Clearance: 39.1 mL/min (by C-G formula based on SCr of 0.84 mg/dL).  Liver Function Tests: No results for input(s): AST, ALT, ALKPHOS, BILITOT, PROT, ALBUMIN in the last 168 hours.  CBG: No results for input(s): GLUCAP in the last 168 hours.   Recent Results (from the past 240 hour(s))  SARS Coronavirus 2 by RT PCR (hospital order, performed in [facility] hospital lab) Nasopharyngeal Nasopharyngeal Swab     Status: None   Collection Time: 01/24/21  1:39 PM   Specimen: Nasopharyngeal Swab  Result Value Ref Range Status   SARS Coronavirus 2 NEGATIVE NEGATIVE Final    Comment: (NOTE) SARS-CoV-2 target nucleic acids are NOT DETECTED.  The SARS-CoV-2 RNA is generally detectable in upper and lower respiratory specimens during the acute phase of infection. The lowest concentration of SARS-CoV-2 viral copies this assay can detect is 250 copies / mL. A negative result does not preclude SARS-CoV-2 infection and should not be used as the sole basis for treatment or other patient management decisions.  A negative result may occur  with improper specimen collection / handling, submission of specimen other than nasopharyngeal swab, presence of viral mutation(s) within the areas targeted by this assay, and inadequate number of viral copies (<250 copies / mL). A negative result must be combined with clinical observations, patient history, and epidemiological information.  Fact Sheet for Patients:   01/26/21  Fact Sheet for Healthcare Providers: BoilerBrush.com.cy  This test is not yet approved or  cleared by the https://pope.com/ FDA and has been authorized for detection and/or diagnosis of SARS-CoV-2 by FDA under an Emergency Use Authorization (EUA).  This EUA will remain in effect (meaning this test can be used) for the duration of the COVID-19 declaration under Section 564(b)(1) of the Act, 21 U.S.C. section 360bbb-3(b)(1), unless the authorization is terminated or revoked sooner.  Performed  at [facility], [address] [place], [place] [address]          Radiology Studies: No results found.      Scheduled Meds: . buPROPion  150 mg Oral Daily  . docusate sodium  100 mg Oral BID  . ferrous sulfate  325 mg Oral TID PC  . levothyroxine  75 mcg Oral QAC breakfast  . metoprolol tartrate  12.5 mg Oral BID  . PARoxetine  40 mg Oral Daily  . polyethylene glycol  17 g Oral BID  . Rivaroxaban  15 mg Oral Q supper  . senna  1 tablet Oral Daily  . simvastatin  5 mg Oral QPM  . vitamin B-12  100 mcg Oral Daily   Continuous Infusions: . lactated ringers 45 mL/hr at 01/27/21 1532     LOS: 5 days   Time spent: Greater than 50% of this time was spent in counseling, explanation of diagnosis, planning of further management, and coordination of care.  I have personally reviewed and interpreted on  01/29/2021 daily labs,I reviewed all nursing notes, pharmacy notes, consultant notes,  vitals, pertinent old records  I have discussed plan  of care as described above with RN , patient  on 01/29/2021  Voice Recognition /Dragon dictation system was used to create this note, attempts have been made to correct errors. Please contact the author with questions and/or clarifications.   Albertine Grates, MD PhD FACP [facility]  Available via Epic secure chat 7am-7pm for nonurgent issues Please page for urgent issues To page the attending provider between 7A-7P or the covering provider during after hours 7P-7A, please log into the web site [email or web address] and access using universal DISH password for that web site. If you do not have the password, please call the hospital operator.    01/29/2021, 2:28 PM

## 2021-01-30 DIAGNOSIS — E46 Unspecified protein-calorie malnutrition: Secondary | ICD-10-CM | POA: Diagnosis not present

## 2021-01-30 DIAGNOSIS — S72001A Fracture of unspecified part of neck of right femur, initial encounter for closed fracture: Secondary | ICD-10-CM | POA: Diagnosis not present

## 2021-01-30 DIAGNOSIS — Z8781 Personal history of (healed) traumatic fracture: Secondary | ICD-10-CM | POA: Diagnosis not present

## 2021-01-30 DIAGNOSIS — M2550 Pain in unspecified joint: Secondary | ICD-10-CM | POA: Diagnosis not present

## 2021-01-30 DIAGNOSIS — M25511 Pain in right shoulder: Secondary | ICD-10-CM | POA: Diagnosis not present

## 2021-01-30 DIAGNOSIS — M542 Cervicalgia: Secondary | ICD-10-CM | POA: Diagnosis not present

## 2021-01-30 DIAGNOSIS — M25551 Pain in right hip: Secondary | ICD-10-CM | POA: Diagnosis not present

## 2021-01-30 DIAGNOSIS — R2681 Unsteadiness on feet: Secondary | ICD-10-CM | POA: Diagnosis not present

## 2021-01-30 DIAGNOSIS — Z79899 Other long term (current) drug therapy: Secondary | ICD-10-CM | POA: Diagnosis not present

## 2021-01-30 DIAGNOSIS — S72001D Fracture of unspecified part of neck of right femur, subsequent encounter for closed fracture with routine healing: Secondary | ICD-10-CM | POA: Diagnosis not present

## 2021-01-30 DIAGNOSIS — Z4789 Encounter for other orthopedic aftercare: Secondary | ICD-10-CM | POA: Diagnosis not present

## 2021-01-30 DIAGNOSIS — I480 Paroxysmal atrial fibrillation: Secondary | ICD-10-CM | POA: Diagnosis not present

## 2021-01-30 DIAGNOSIS — I10 Essential (primary) hypertension: Secondary | ICD-10-CM | POA: Diagnosis not present

## 2021-01-30 DIAGNOSIS — Z8673 Personal history of transient ischemic attack (TIA), and cerebral infarction without residual deficits: Secondary | ICD-10-CM | POA: Diagnosis not present

## 2021-01-30 DIAGNOSIS — E039 Hypothyroidism, unspecified: Secondary | ICD-10-CM | POA: Diagnosis not present

## 2021-01-30 DIAGNOSIS — Z7401 Bed confinement status: Secondary | ICD-10-CM | POA: Diagnosis not present

## 2021-01-30 DIAGNOSIS — R0902 Hypoxemia: Secondary | ICD-10-CM | POA: Diagnosis not present

## 2021-01-30 DIAGNOSIS — I4891 Unspecified atrial fibrillation: Secondary | ICD-10-CM | POA: Diagnosis not present

## 2021-01-30 DIAGNOSIS — Z4889 Encounter for other specified surgical aftercare: Secondary | ICD-10-CM | POA: Diagnosis not present

## 2021-01-30 LAB — BASIC METABOLIC PANEL
Anion gap: 12 (ref 5–15)
BUN: 24 mg/dL — ABNORMAL HIGH (ref 8–23)
CO2: 27 mmol/L (ref 22–32)
Calcium: 8.9 mg/dL (ref 8.9–10.3)
Chloride: 101 mmol/L (ref 98–111)
Creatinine, Ser: 1.21 mg/dL — ABNORMAL HIGH (ref 0.44–1.00)
GFR, Estimated: 43 mL/min — ABNORMAL LOW (ref >60)
Glucose, Bld: 89 mg/dL (ref 70–99)
Potassium: 3.1 mmol/L — ABNORMAL LOW (ref 3.5–5.1)
Sodium: 140 mmol/L (ref 135–145)

## 2021-01-30 LAB — MAGNESIUM: Magnesium: 1.5 mg/dL — ABNORMAL LOW (ref 1.7–2.4)

## 2021-01-30 MED ORDER — MAGNESIUM SULFATE 2 GM/50ML IV SOLN
2.0000 g | Freq: Once | INTRAVENOUS | Status: AC
  Administered 2021-01-30: 2 g via INTRAVENOUS
  Filled 2021-01-30: qty 50

## 2021-01-30 MED ORDER — POTASSIUM CHLORIDE CRYS ER 20 MEQ PO TBCR
40.0000 meq | EXTENDED_RELEASE_TABLET | Freq: Once | ORAL | Status: AC
  Administered 2021-01-30: 40 meq via ORAL
  Filled 2021-01-30: qty 2

## 2021-01-30 MED ORDER — FERROUS SULFATE 325 (65 FE) MG PO TABS: 325.0000 mg | ORAL_TABLET | Freq: Every day | ORAL | 0 refills | Status: DC

## 2021-01-30 MED ORDER — SENNOSIDES-DOCUSATE SODIUM 8.6-50 MG PO TABS: 1.0000 | ORAL_TABLET | Freq: Every day | ORAL | Status: DC

## 2021-01-30 MED ORDER — LACTATED RINGERS IV BOLUS
500.0000 mL | Freq: Once | INTRAVENOUS | Status: AC
  Administered 2021-01-30: 500 mL via INTRAVENOUS

## 2021-01-30 MED ORDER — CYANOCOBALAMIN 100 MCG PO TABS: 100.0000 ug | ORAL_TABLET | Freq: Every day | ORAL | Status: DC

## 2021-01-30 MED ORDER — POLYETHYLENE GLYCOL 3350 17 G PO PACK: 17.0000 g | PACK | Freq: Every day | ORAL | 0 refills | Status: DC

## 2021-01-30 NOTE — TOC Transition Note (Signed)
Transition of Care [facility]) - CM/SW Discharge Note   Patient Details  Name: Kelita Wallis MRN: [id number] Date of Birth: September 16, 1929  [facility]) CM/SW Contact:  Carley Hammed, LCSWA Phone Number: 01/30/2021, 11:49 AM   Clinical Narrative:    Nurse to call report to [phone] Rm# [phone]   Final next level of care: Skilled Nursing Facility Barriers to Discharge: Barriers Resolved   Patient Goals and CMS Choice   CMS Medicare.gov Compare Post Acute Care list provided to:: Patient Represenative (must comment) Choice offered to / list presented to : [facility] POA / Guardian  Discharge Placement              Patient chooses bed at: [facility], [place] Patient to be transferred to facility by: [facility] Name of family member notified: [name] Patient and family notified of of transfer: 01/30/21  Discharge Plan and Services                                     Social Determinants of Health (SDOH) Interventions     Readmission Risk Interventions No flowsheet data found.

## 2021-01-30 NOTE — Progress Notes (Signed)
Report given to nurse kelly,RN at [facility]. DC packaged printed and will give to [facility].

## 2021-01-30 NOTE — Plan of Care (Signed)
  Problem: Activity: Goal: Risk for activity intolerance will decrease Outcome: Progressing   Problem: Nutrition: Goal: Adequate nutrition will be maintained Outcome: Progressing   Problem: Safety: Goal: Ability to remain free from injury will improve Outcome: Progressing   Problem: Pain Managment: Goal: General experience of comfort will improve Outcome: Progressing   Problem: Skin Integrity: Goal: Risk for impaired skin integrity will decrease Outcome: Progressing   

## 2021-01-30 NOTE — Discharge Summary (Signed)
Discharge Summary  Christina Ortiz XJD:[id number] DOB: [birth year]  PCP: Mayer Masker, PA-C  Admit date: 01/24/2021 Discharge date: 01/30/2021  Time spent: , more than 50% time spent on coordination of care.   Recommendations for Outpatient Follow-up:  1. F/u with SNF MD  for hospital discharge follow up, repeat cbc/bmp/mag at follow up 2. F/u with Ortho Dr Charlann Boxer in two weeks 3. Recommend palliative care following at SNF  Discharge Diagnoses:  Active Hospital Problems   Diagnosis Date Noted  . Hip fx, right, closed, initial encounter (HCC) 01/24/2021  . Right hip pain 01/24/2021  . Leukocytosis 01/24/2021  . Depression 01/24/2021  . Hypertension 11/09/2017  . Paroxysmal atrial fibrillation (HCC) 11/09/2017  . Chronic anticoagulation 04/04/2015    Resolved Hospital Problems  No resolved problems to display.    Discharge Condition: stable  Diet recommendation: regular diet, push fluids as patient is prone to dehydration,   Filed Weights   01/27/21 0500 01/28/21 0500 01/28/21 0503  Weight: 64.7 kg 61 kg 61 kg    History of present illness: ( per admitting MD Dr Arlean Hopping) Christina Ortiz is a 87 y.o. female with medical history significant for coronary artery disease status post PCI with stent placement to the RCA and LAD in 2006, paroxysmal atrial fibrillation chronically anticoagulated on Xarelto, acquired hypothyroidism, hypertension, stage IIIa chronic kidney disease with baseline creatinine 0.9 - 1.3 who is admitted to [facility] on 01/24/2021 with acute right intertrochanteric hip fracture after presenting from home to [facility] Emergency Department complaining of right hip pain.  The patient reports that he experienced a fall earlier this morning while attempting to ambulate outside to pick up a newspaper, at which time she reports that she slipped on the snow falling forward in spite of using her home walker.   Hospital Course:  Principal Problem:   Hip fx,  right, closed, initial encounter [facility]) Active Problems:   Chronic anticoagulation   Hypertension   Paroxysmal atrial fibrillation (HCC)   Right hip pain   Leukocytosis   Depression  Right Intertrochanteric hip fracture after mechanical fall: -Underwent open reduction internal fixation of right intertrochanteric femur fracture nail on 1/30. --DVT prophylaxis; Back on Xarelto.  -Vitamin D level; 30.  -On miralax to prevent constipation.   Leukocytosis/hyponatremia/hemoconcentration/AKI on CKD 2; On admission UA and chest x ray was negative for infection. She received hydration WBC peaked at 14.2, normalized Labs normalized She is noticed to not drinking enough fluids, patient needs to be prompted to drink fluids.  Hypokalemia/hypomagnesemia  repalce k/ mag  Macrocytosis B12 228, folic acid normal She is started on B12 supplement  PAF;  Back on xarelto.  Continue with Metoprolol.   HTN;  Hold Quinapril and spironolactone due to AKI.  bp stable on metoprolol along, home meds norvasc as well    Hypothyroidism;  Continue with synthroid.   Depression; Continue with Wellbutrin and paxil.    Code Status: Full Family Communication: daughter over the phone Disposition: SNF     Consultants:   Orthopedics  Procedures:  Underwent open reduction internal fixation of right intertrochanteric femur fracture nail on 1/30.    Antimicrobials:   Perioperative   Discharge Exam: BP 133/84 (BP Location: Right Arm)   Pulse (!) 51   Temp 98.3 F (36.8 C) (Oral)   Resp 18   Ht 5\' 2"  (1.575 m)   Wt 61 kg   LMP  (LMP Unknown)   SpO2 95%   BMI 24.60 kg/m   General: frail  elderly, NAD, pleasant Cardiovascular: RRR Respiratory: normal respiratory effort  Discharge Instructions You were cared for by a hospitalist during your hospital stay. If you have any questions about your discharge medications or the care you received while you  were in the hospital after you are discharged, you can call the unit and asked to speak with the hospitalist on call if the hospitalist that took care of you is not available. Once you are discharged, your primary care physician will handle any further medical issues. Please note that NO REFILLS for any discharge medications will be authorized once you are discharged, as it is imperative that you return to your primary care physician (or establish a relationship with a primary care physician if you do not have one) for your aftercare needs so that they can reassess your need for medications and monitor your lab values.  Discharge Instructions    Diet general   Complete by: As directed    Discharge wound care:   Complete by: As directed    Wound care per ortho instruction Reinforce dressing   Increase activity slowly   Complete by: As directed    Partial weight bearing   Complete by: As directed    % Body Weight: 50   Laterality: right   Extremity: Lower     Allergies as of 01/30/2021   No Known Allergies     Medication List    STOP taking these medications   amLODipine 5 MG tablet Commonly known as: NORVASC   fesoterodine 4 MG Tb24 tablet Commonly known as: TOVIAZ   quinapril 40 MG tablet Commonly known as: ACCUPRIL   spironolactone 25 MG tablet Commonly known as: ALDACTONE     TAKE these medications   acetaminophen 325 MG tablet Commonly known as: TYLENOL Take 650 mg by mouth every 6 (six) hours as needed for moderate pain (for pain.).   antiseptic oral rinse Liqd 15 mLs by Mouth Rinse route daily.   buPROPion 150 MG 24 hr tablet Commonly known as: Wellbutrin XL Take 1 tablet (150 mg total) by mouth daily.   CALCIUM-MAGNESIUM-ZINC PO Take 1 tablet by mouth daily.   cyanocobalamin 100 MCG tablet Take 1 tablet (100 mcg total) by mouth daily. Start taking on: January 31, 2021   ferrous sulfate 325 (65 FE) MG tablet Take 1 tablet (325 mg total) by mouth daily with  breakfast for 14 days.   folic acid 1 MG tablet Commonly known as: FOLVITE Take 1 tablet (1 mg total) by mouth daily.   HYDROcodone-acetaminophen 5-325 MG tablet Commonly known as: Norco Take 1-2 tablets by mouth every 6 (six) hours as needed for moderate pain or severe pain.   levothyroxine 75 MCG tablet Commonly known as: SYNTHROID Take 1 tablet (75 mcg total) by mouth daily before breakfast. **PATIENT NEEDS LAB FOR FURTHER REFILLS**   lidocaine 5 % Commonly known as: LIDODERM Place 1 patch onto the skin daily. Remove & Discard patch within 12 hours or as directed by MD   metoprolol tartrate 25 MG tablet Commonly known as: LOPRESSOR Take 0.5 tablets (12.5 mg total) by mouth 2 (two) times daily. **NEEDS APT FOR FURTHER MED REFILLS**   omeprazole 20 MG capsule Commonly known as: PRILOSEC TAKE 1 CAPSULE BY MOUTH DAILY AS NEEDED FOR HEARTBURN. What changed: See the new instructions.   PARoxetine 20 MG tablet Commonly known as: Paxil Take 1 tab by mouth daily for 2 weeks then increase to 2 tabs by mouth daily What changed:  how much to take  how to take this  when to take this  additional instructions   polyethylene glycol 17 g packet Commonly known as: MIRALAX / GLYCOLAX Take 17 g by mouth daily.   potassium chloride SA 20 MEQ tablet Commonly known as: KLOR-CON Take 1 tablet (20 mEq total) by mouth daily. Please keep upcoming schedule for future refill What changed: additional instructions   Propylene Glycol 0.6 % Soln Place 1 drop into both eyes daily.   senna-docusate 8.6-50 MG tablet Commonly known as: Senokot-S Take 1 tablet by mouth at bedtime.   simvastatin 5 MG tablet Commonly known as: ZOCOR TAKE 1 TABLET BY MOUTH EVERY EVENING.   VITAMIN D PO Take 1 tablet by mouth at bedtime.   Xarelto 15 MG Tabs tablet Generic drug: Rivaroxaban TAKE 1 TABLET BY MOUTH DAILY WITH SUPPER. What changed: See the new instructions.            Discharge Care  Instructions  (From admission, onward)         Start     Ordered   01/30/21 0000  Discharge wound care:       Comments: Wound care per ortho instruction Reinforce dressing   01/30/21 1008   01/27/21 0000  Partial weight bearing       Question Answer Comment  % Body Weight 50   Laterality right   Extremity Lower      01/27/21 0734         No Known Allergies  Contact information for follow-up providers    Durene Romans, MD. Schedule an appointment as soon as possible for a visit in 2 weeks.   Specialty: Orthopedic Surgery Contact information: [address] [place] [address] [phone]            Contact information for after-discharge care    Destination    [facility]erred [facility]F .   Service: Skilled Nursing Contact information: [address] [place] [address] [phone]                   The results of significant diagnostics from this hospitalization (including imaging, microbiology, ancillary and laboratory) are listed below for reference.    Significant Diagnostic Studies: CT HEAD WO CONTRAST  Result Date: 01/24/2021 CLINICAL DATA:  Head trauma, fell on snow, on anticoagulants EXAM: CT HEAD WITHOUT CONTRAST TECHNIQUE: Contiguous axial images were obtained from the base of the skull through the vertex without intravenous contrast. Sagittal and coronal MPR images reconstructed from axial data set. COMPARISON:  07/23/2020 FINDINGS: Brain: Generalized atrophy. Normal ventricular morphology. No midline shift or mass effect. Small vessel chronic ischemic changes of deep cerebral white matter. Small old LEFT basal ganglia lacunar infarct. Old RIGHT cerebellar infarct. No intracranial hemorrhage, mass lesion, or evidence of acute infarction. No extra-axial fluid collections. Vascular: No hyperdense vessels. Atherosclerotic calcification of internal carotid arteries at skull base Skull: Intact  Sinuses/Orbits: Clear Other: N/A IMPRESSION: Atrophy with small vessel chronic ischemic changes of deep cerebral white matter. 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 No acute intracranial abnormalities. Electronically Signed   By: Ulyses Southward M.D.   On: 01/24/2021 11:58   DG C-Arm 1-60 Min  Result Date: 01/25/2021 CLINICAL DATA:  RIGHT hip fracture EXAM: DG C-ARM 1-60 MIN; OPERATIVE RIGHT HIP WITH PELVIS FLUOROSCOPY TIME:  Fluoroscopy Time:  53 seconds Radiation Exposure Index (if provided by the fluoroscopic device): 5.12 mGy COMPARISON:  Plain film of the  pelvis and RIGHT hip dated 01/24/2021. FINDINGS: Intraoperative fluoroscopic images show intramedullary rod and fixation screw traversing the RIGHT femoral neck fracture site. Osseous alignment is significantly improved and now appears anatomic. Hardware appears intact and appropriately positioned. IMPRESSION: Intraoperative fluoroscopic images, as detailed above. No evidence of surgical complicating feature. Electronically Signed   By: Bary Richard M.D.   On: 01/25/2021 14:20   DG HIP OPERATIVE UNILAT WITH PELVIS RIGHT  Result Date: 01/25/2021 CLINICAL DATA:  RIGHT hip fracture EXAM: DG C-ARM 1-60 MIN; OPERATIVE RIGHT HIP WITH PELVIS FLUOROSCOPY TIME:  Fluoroscopy Time:  53 seconds Radiation Exposure Index (if provided by the fluoroscopic device): 5.12 mGy COMPARISON:  Plain film of the pelvis and RIGHT hip dated 01/24/2021. FINDINGS: Intraoperative fluoroscopic images show intramedullary rod and fixation screw traversing the RIGHT femoral neck fracture site. Osseous alignment is significantly improved and now appears anatomic. Hardware appears intact and appropriately positioned. IMPRESSION: Intraoperative fluoroscopic images, as detailed above. No evidence of surgical complicating feature. Electronically Signed   By: Bary Richard M.D.   On: 01/25/2021 14:20   DG Hip Unilat  With Pelvis 2-3 Views Right  Result Date:  01/24/2021 CLINICAL DATA:  Right hip pain and deformity following a fall today. EXAM: DG HIP (WITH OR WITHOUT PELVIS) 2-3V RIGHT COMPARISON:  06/23/2020 FINDINGS: Interval acute, comminuted intertrochanteric fracture on the right with varus angulation. The previously demonstrated superior trochanter fracture fragments are unchanged. Diffuse osteopenia. IMPRESSION: Acute, comminuted right intertrochanteric fracture with varus angulation. Electronically Signed   By: Beckie Salts M.D.   On: 01/24/2021 12:04    Microbiology: Recent Results (from the past 240 hour(s))  SARS Coronavirus 2 by RT PCR (hospital order, performed in [facility] hospital lab) Nasopharyngeal Nasopharyngeal Swab     Status: None   Collection Time: 01/24/21  1:39 PM   Specimen: Nasopharyngeal Swab  Result Value Ref Range Status   SARS Coronavirus 2 NEGATIVE NEGATIVE Final    Comment: (NOTE) SARS-CoV-2 target nucleic acids are NOT DETECTED.  The SARS-CoV-2 RNA is generally detectable in upper and lower respiratory specimens during the acute phase of infection. The lowest concentration of SARS-CoV-2 viral copies this assay can detect is 250 copies / mL. A negative result does not preclude SARS-CoV-2 infection and should not be used as the sole basis for treatment or other patient management decisions.  A negative result may occur with improper specimen collection / handling, submission of specimen other than nasopharyngeal swab, presence of viral mutation(s) within the areas targeted by this assay, and inadequate number of viral copies (<250 copies / mL). A negative result must be combined with clinical observations, patient history, and epidemiological information.  Fact Sheet for Patients:   BoilerBrush.com.cy  Fact Sheet for Healthcare Providers: https://pope.com/  This test is not yet approved or  cleared by the [place] FDA and has been authorized for detection  and/or diagnosis of SARS-CoV-2 by FDA under an Emergency Use Authorization (EUA).  This EUA will remain in effect (meaning this test can be used) for the duration of the COVID-19 declaration under Section 564(b)(1) of the Act, 21 U.S.C. section 360bbb-3(b)(1), unless the authorization is terminated or revoked sooner.  Performed at [facility], [address], [place], [place] [address]   SARS CORONAVIRUS 2 (TAT 6-24 HRS) Nasopharyngeal Nasopharyngeal Swab     Status: None   Collection Time: 01/29/21  1:03 PM   Specimen: Nasopharyngeal Swab  Result Value Ref Range Status   SARS Coronavirus 2 NEGATIVE  NEGATIVE Final    Comment: (NOTE) SARS-CoV-2 target nucleic acids are NOT DETECTED.  The SARS-CoV-2 RNA is generally detectable in upper and lower respiratory specimens during the acute phase of infection. Negative results do not preclude SARS-CoV-2 infection, do not rule out co-infections with other pathogens, and should not be used as the sole basis for treatment or other patient management decisions. Negative results must be combined with clinical observations, patient history, and epidemiological information. The expected result is Negative.  Fact Sheet for Patients: HairSlick.nohttps://www.fda.gov/media/138098/download  Fact Sheet for Healthcare Providers: quierodirigir.comhttps://www.fda.gov/media/138095/download  This test is not yet approved or cleared by the [place] FDA and  has been authorized for detection and/or diagnosis of SARS-CoV-2 by FDA under an Emergency Use Authorization (EUA). This EUA will remain  in effect (meaning this test can be used) for the duration of the COVID-19 declaration under Se ction 564(b)(1) of the Act, 21 U.S.C. section 360bbb-3(b)(1), unless the authorization is terminated or revoked sooner.  Performed at [facility], [address] [place], [place] [id number]      Labs: Basic Metabolic Panel: Recent Labs  Lab 01/25/21 0254 01/26/21 0441  01/27/21 0334 01/28/21 0336 01/30/21 0349  NA 140 138 134* 137 140  K 4.3 4.6 4.0 3.6 3.1*  CL 105 105 103 102 101  CO2 24 23 24 26 27   GLUCOSE 134* 153* 96 107* 89  BUN 28* 40* 36* 26* 24*  CREATININE 1.14* 1.51* 1.16* 0.84 1.21*  CALCIUM 9.4 8.7* 8.6* 9.0 8.9  MG 2.1  --   --   --  1.5*   Liver Function Tests: No results for input(s): AST, ALT, ALKPHOS, BILITOT, PROT, ALBUMIN in the last 168 hours. No results for input(s): LIPASE, AMYLASE in the last 168 hours. No results for input(s): AMMONIA in the last 168 hours. CBC: Recent Labs  Lab 01/24/21 1246 01/24/21 2024 01/25/21 0254 01/26/21 0441 01/27/21 0334 01/28/21 0336  WBC 12.4*  --  9.8 12.9* 14.2* 9.6  NEUTROABS 10.7*  --  8.2*  --   --   --   HGB 16.2* 16.3* 16.4* 13.6 12.2 13.9  HCT 49.4* 48.0* 48.7* 42.3 35.5* 40.2  MCV 102.1*  --  100.0 104.4* 100.6* 99.3  PLT 206  --  211 180 171 178   Cardiac Enzymes: No results for input(s): CKTOTAL, CKMB, CKMBINDEX, TROPONINI in the last 168 hours. BNP: BNP (last 3 results) No results for input(s): BNP in the last 8760 hours.  ProBNP (last 3 results) No results for input(s): PROBNP in the last 8760 hours.  CBG: No results for input(s): GLUCAP in the last 168 hours.     Signed:  Albertine GratesFang Lolita Faulds MD, PhD, FACP  [facility] 01/30/2021, 1:04 PM

## 2021-02-01 DIAGNOSIS — Z8781 Personal history of (healed) traumatic fracture: Secondary | ICD-10-CM | POA: Diagnosis not present

## 2021-02-01 DIAGNOSIS — E039 Hypothyroidism, unspecified: Secondary | ICD-10-CM | POA: Diagnosis not present

## 2021-02-01 DIAGNOSIS — R2681 Unsteadiness on feet: Secondary | ICD-10-CM | POA: Diagnosis not present

## 2021-02-01 DIAGNOSIS — I480 Paroxysmal atrial fibrillation: Secondary | ICD-10-CM | POA: Diagnosis not present

## 2021-02-01 DIAGNOSIS — E46 Unspecified protein-calorie malnutrition: Secondary | ICD-10-CM | POA: Diagnosis not present

## 2021-02-01 DIAGNOSIS — Z8673 Personal history of transient ischemic attack (TIA), and cerebral infarction without residual deficits: Secondary | ICD-10-CM | POA: Diagnosis not present

## 2021-02-13 DIAGNOSIS — M25511 Pain in right shoulder: Secondary | ICD-10-CM | POA: Diagnosis not present

## 2021-02-13 DIAGNOSIS — Z4789 Encounter for other orthopedic aftercare: Secondary | ICD-10-CM | POA: Diagnosis not present

## 2021-02-13 DIAGNOSIS — M542 Cervicalgia: Secondary | ICD-10-CM | POA: Diagnosis not present

## 2021-02-23 DIAGNOSIS — N179 Acute kidney failure, unspecified: Secondary | ICD-10-CM | POA: Diagnosis not present

## 2021-02-23 DIAGNOSIS — I129 Hypertensive chronic kidney disease with stage 1 through stage 4 chronic kidney disease, or unspecified chronic kidney disease: Secondary | ICD-10-CM | POA: Diagnosis not present

## 2021-02-23 DIAGNOSIS — N182 Chronic kidney disease, stage 2 (mild): Secondary | ICD-10-CM | POA: Diagnosis not present

## 2021-02-23 DIAGNOSIS — G8929 Other chronic pain: Secondary | ICD-10-CM | POA: Diagnosis not present

## 2021-02-23 DIAGNOSIS — M25511 Pain in right shoulder: Secondary | ICD-10-CM | POA: Diagnosis not present

## 2021-02-23 DIAGNOSIS — D631 Anemia in chronic kidney disease: Secondary | ICD-10-CM | POA: Diagnosis not present

## 2021-02-23 DIAGNOSIS — I480 Paroxysmal atrial fibrillation: Secondary | ICD-10-CM | POA: Diagnosis not present

## 2021-02-23 DIAGNOSIS — S72141D Displaced intertrochanteric fracture of right femur, subsequent encounter for closed fracture with routine healing: Secondary | ICD-10-CM | POA: Diagnosis not present

## 2021-02-23 DIAGNOSIS — E46 Unspecified protein-calorie malnutrition: Secondary | ICD-10-CM | POA: Diagnosis not present

## 2021-02-25 DIAGNOSIS — E46 Unspecified protein-calorie malnutrition: Secondary | ICD-10-CM | POA: Diagnosis not present

## 2021-02-25 DIAGNOSIS — S72141D Displaced intertrochanteric fracture of right femur, subsequent encounter for closed fracture with routine healing: Secondary | ICD-10-CM | POA: Diagnosis not present

## 2021-02-25 DIAGNOSIS — N182 Chronic kidney disease, stage 2 (mild): Secondary | ICD-10-CM | POA: Diagnosis not present

## 2021-02-25 DIAGNOSIS — G8929 Other chronic pain: Secondary | ICD-10-CM | POA: Diagnosis not present

## 2021-02-25 DIAGNOSIS — N179 Acute kidney failure, unspecified: Secondary | ICD-10-CM | POA: Diagnosis not present

## 2021-02-25 DIAGNOSIS — I480 Paroxysmal atrial fibrillation: Secondary | ICD-10-CM | POA: Diagnosis not present

## 2021-02-25 DIAGNOSIS — I129 Hypertensive chronic kidney disease with stage 1 through stage 4 chronic kidney disease, or unspecified chronic kidney disease: Secondary | ICD-10-CM | POA: Diagnosis not present

## 2021-02-25 DIAGNOSIS — M25511 Pain in right shoulder: Secondary | ICD-10-CM | POA: Diagnosis not present

## 2021-02-25 DIAGNOSIS — D631 Anemia in chronic kidney disease: Secondary | ICD-10-CM | POA: Diagnosis not present

## 2021-02-26 DIAGNOSIS — E538 Deficiency of other specified B group vitamins: Secondary | ICD-10-CM | POA: Diagnosis not present

## 2021-02-26 DIAGNOSIS — I10 Essential (primary) hypertension: Secondary | ICD-10-CM | POA: Diagnosis not present

## 2021-02-26 DIAGNOSIS — I480 Paroxysmal atrial fibrillation: Secondary | ICD-10-CM | POA: Diagnosis not present

## 2021-02-26 DIAGNOSIS — D7589 Other specified diseases of blood and blood-forming organs: Secondary | ICD-10-CM | POA: Diagnosis not present

## 2021-02-26 DIAGNOSIS — D631 Anemia in chronic kidney disease: Secondary | ICD-10-CM | POA: Diagnosis not present

## 2021-02-26 DIAGNOSIS — I129 Hypertensive chronic kidney disease with stage 1 through stage 4 chronic kidney disease, or unspecified chronic kidney disease: Secondary | ICD-10-CM | POA: Diagnosis not present

## 2021-02-26 DIAGNOSIS — N182 Chronic kidney disease, stage 2 (mild): Secondary | ICD-10-CM | POA: Diagnosis not present

## 2021-02-26 DIAGNOSIS — K5909 Other constipation: Secondary | ICD-10-CM | POA: Diagnosis not present

## 2021-02-26 DIAGNOSIS — G8929 Other chronic pain: Secondary | ICD-10-CM | POA: Diagnosis not present

## 2021-02-26 DIAGNOSIS — M25511 Pain in right shoulder: Secondary | ICD-10-CM | POA: Diagnosis not present

## 2021-02-26 DIAGNOSIS — F1020 Alcohol dependence, uncomplicated: Secondary | ICD-10-CM | POA: Diagnosis not present

## 2021-02-26 DIAGNOSIS — F4320 Adjustment disorder, unspecified: Secondary | ICD-10-CM | POA: Diagnosis not present

## 2021-02-26 DIAGNOSIS — K219 Gastro-esophageal reflux disease without esophagitis: Secondary | ICD-10-CM | POA: Diagnosis not present

## 2021-02-26 DIAGNOSIS — E46 Unspecified protein-calorie malnutrition: Secondary | ICD-10-CM | POA: Diagnosis not present

## 2021-02-26 DIAGNOSIS — E568 Deficiency of other vitamins: Secondary | ICD-10-CM | POA: Diagnosis not present

## 2021-02-26 DIAGNOSIS — N179 Acute kidney failure, unspecified: Secondary | ICD-10-CM | POA: Diagnosis not present

## 2021-02-26 DIAGNOSIS — E038 Other specified hypothyroidism: Secondary | ICD-10-CM | POA: Diagnosis not present

## 2021-02-26 DIAGNOSIS — S72141D Displaced intertrochanteric fracture of right femur, subsequent encounter for closed fracture with routine healing: Secondary | ICD-10-CM | POA: Diagnosis not present

## 2021-03-02 DIAGNOSIS — D631 Anemia in chronic kidney disease: Secondary | ICD-10-CM | POA: Diagnosis not present

## 2021-03-02 DIAGNOSIS — G8929 Other chronic pain: Secondary | ICD-10-CM | POA: Diagnosis not present

## 2021-03-02 DIAGNOSIS — I480 Paroxysmal atrial fibrillation: Secondary | ICD-10-CM | POA: Diagnosis not present

## 2021-03-02 DIAGNOSIS — N182 Chronic kidney disease, stage 2 (mild): Secondary | ICD-10-CM | POA: Diagnosis not present

## 2021-03-02 DIAGNOSIS — S72141D Displaced intertrochanteric fracture of right femur, subsequent encounter for closed fracture with routine healing: Secondary | ICD-10-CM | POA: Diagnosis not present

## 2021-03-02 DIAGNOSIS — I129 Hypertensive chronic kidney disease with stage 1 through stage 4 chronic kidney disease, or unspecified chronic kidney disease: Secondary | ICD-10-CM | POA: Diagnosis not present

## 2021-03-02 DIAGNOSIS — E46 Unspecified protein-calorie malnutrition: Secondary | ICD-10-CM | POA: Diagnosis not present

## 2021-03-02 DIAGNOSIS — N179 Acute kidney failure, unspecified: Secondary | ICD-10-CM | POA: Diagnosis not present

## 2021-03-02 DIAGNOSIS — M25511 Pain in right shoulder: Secondary | ICD-10-CM | POA: Diagnosis not present

## 2021-03-03 DIAGNOSIS — E08311 Diabetes mellitus due to underlying condition with unspecified diabetic retinopathy with macular edema: Secondary | ICD-10-CM | POA: Diagnosis not present

## 2021-03-03 DIAGNOSIS — E039 Hypothyroidism, unspecified: Secondary | ICD-10-CM | POA: Diagnosis not present

## 2021-03-03 DIAGNOSIS — D519 Vitamin B12 deficiency anemia, unspecified: Secondary | ICD-10-CM | POA: Diagnosis not present

## 2021-03-03 DIAGNOSIS — R4182 Altered mental status, unspecified: Secondary | ICD-10-CM | POA: Diagnosis not present

## 2021-03-03 DIAGNOSIS — E559 Vitamin D deficiency, unspecified: Secondary | ICD-10-CM | POA: Diagnosis not present

## 2021-03-04 DIAGNOSIS — M25511 Pain in right shoulder: Secondary | ICD-10-CM | POA: Diagnosis not present

## 2021-03-04 DIAGNOSIS — I129 Hypertensive chronic kidney disease with stage 1 through stage 4 chronic kidney disease, or unspecified chronic kidney disease: Secondary | ICD-10-CM | POA: Diagnosis not present

## 2021-03-04 DIAGNOSIS — D631 Anemia in chronic kidney disease: Secondary | ICD-10-CM | POA: Diagnosis not present

## 2021-03-04 DIAGNOSIS — I480 Paroxysmal atrial fibrillation: Secondary | ICD-10-CM | POA: Diagnosis not present

## 2021-03-04 DIAGNOSIS — N179 Acute kidney failure, unspecified: Secondary | ICD-10-CM | POA: Diagnosis not present

## 2021-03-04 DIAGNOSIS — E46 Unspecified protein-calorie malnutrition: Secondary | ICD-10-CM | POA: Diagnosis not present

## 2021-03-04 DIAGNOSIS — S72141D Displaced intertrochanteric fracture of right femur, subsequent encounter for closed fracture with routine healing: Secondary | ICD-10-CM | POA: Diagnosis not present

## 2021-03-04 DIAGNOSIS — G8929 Other chronic pain: Secondary | ICD-10-CM | POA: Diagnosis not present

## 2021-03-04 DIAGNOSIS — N182 Chronic kidney disease, stage 2 (mild): Secondary | ICD-10-CM | POA: Diagnosis not present

## 2021-03-05 DIAGNOSIS — I480 Paroxysmal atrial fibrillation: Secondary | ICD-10-CM | POA: Diagnosis not present

## 2021-03-05 DIAGNOSIS — M25511 Pain in right shoulder: Secondary | ICD-10-CM | POA: Diagnosis not present

## 2021-03-05 DIAGNOSIS — N179 Acute kidney failure, unspecified: Secondary | ICD-10-CM | POA: Diagnosis not present

## 2021-03-05 DIAGNOSIS — N182 Chronic kidney disease, stage 2 (mild): Secondary | ICD-10-CM | POA: Diagnosis not present

## 2021-03-05 DIAGNOSIS — G8929 Other chronic pain: Secondary | ICD-10-CM | POA: Diagnosis not present

## 2021-03-05 DIAGNOSIS — E46 Unspecified protein-calorie malnutrition: Secondary | ICD-10-CM | POA: Diagnosis not present

## 2021-03-05 DIAGNOSIS — I129 Hypertensive chronic kidney disease with stage 1 through stage 4 chronic kidney disease, or unspecified chronic kidney disease: Secondary | ICD-10-CM | POA: Diagnosis not present

## 2021-03-05 DIAGNOSIS — S72141D Displaced intertrochanteric fracture of right femur, subsequent encounter for closed fracture with routine healing: Secondary | ICD-10-CM | POA: Diagnosis not present

## 2021-03-05 DIAGNOSIS — D631 Anemia in chronic kidney disease: Secondary | ICD-10-CM | POA: Diagnosis not present

## 2021-03-06 DIAGNOSIS — E538 Deficiency of other specified B group vitamins: Secondary | ICD-10-CM | POA: Diagnosis not present

## 2021-03-06 DIAGNOSIS — E038 Other specified hypothyroidism: Secondary | ICD-10-CM | POA: Diagnosis not present

## 2021-03-06 DIAGNOSIS — E568 Deficiency of other vitamins: Secondary | ICD-10-CM | POA: Diagnosis not present

## 2021-03-06 DIAGNOSIS — Z8639 Personal history of other endocrine, nutritional and metabolic disease: Secondary | ICD-10-CM | POA: Diagnosis not present

## 2021-03-06 DIAGNOSIS — G478 Other sleep disorders: Secondary | ICD-10-CM | POA: Diagnosis not present

## 2021-03-06 DIAGNOSIS — Z8781 Personal history of (healed) traumatic fracture: Secondary | ICD-10-CM | POA: Diagnosis not present

## 2021-03-06 DIAGNOSIS — I2510 Atherosclerotic heart disease of native coronary artery without angina pectoris: Secondary | ICD-10-CM | POA: Diagnosis not present

## 2021-03-06 DIAGNOSIS — F1020 Alcohol dependence, uncomplicated: Secondary | ICD-10-CM | POA: Diagnosis not present

## 2021-03-09 DIAGNOSIS — D631 Anemia in chronic kidney disease: Secondary | ICD-10-CM | POA: Diagnosis not present

## 2021-03-09 DIAGNOSIS — S72141D Displaced intertrochanteric fracture of right femur, subsequent encounter for closed fracture with routine healing: Secondary | ICD-10-CM | POA: Diagnosis not present

## 2021-03-09 DIAGNOSIS — N179 Acute kidney failure, unspecified: Secondary | ICD-10-CM | POA: Diagnosis not present

## 2021-03-09 DIAGNOSIS — G8929 Other chronic pain: Secondary | ICD-10-CM | POA: Diagnosis not present

## 2021-03-09 DIAGNOSIS — E46 Unspecified protein-calorie malnutrition: Secondary | ICD-10-CM | POA: Diagnosis not present

## 2021-03-09 DIAGNOSIS — I480 Paroxysmal atrial fibrillation: Secondary | ICD-10-CM | POA: Diagnosis not present

## 2021-03-09 DIAGNOSIS — I129 Hypertensive chronic kidney disease with stage 1 through stage 4 chronic kidney disease, or unspecified chronic kidney disease: Secondary | ICD-10-CM | POA: Diagnosis not present

## 2021-03-09 DIAGNOSIS — M25511 Pain in right shoulder: Secondary | ICD-10-CM | POA: Diagnosis not present

## 2021-03-09 DIAGNOSIS — N182 Chronic kidney disease, stage 2 (mild): Secondary | ICD-10-CM | POA: Diagnosis not present

## 2021-03-10 DIAGNOSIS — D631 Anemia in chronic kidney disease: Secondary | ICD-10-CM | POA: Diagnosis not present

## 2021-03-10 DIAGNOSIS — G8929 Other chronic pain: Secondary | ICD-10-CM | POA: Diagnosis not present

## 2021-03-10 DIAGNOSIS — I129 Hypertensive chronic kidney disease with stage 1 through stage 4 chronic kidney disease, or unspecified chronic kidney disease: Secondary | ICD-10-CM | POA: Diagnosis not present

## 2021-03-10 DIAGNOSIS — N182 Chronic kidney disease, stage 2 (mild): Secondary | ICD-10-CM | POA: Diagnosis not present

## 2021-03-10 DIAGNOSIS — S72141D Displaced intertrochanteric fracture of right femur, subsequent encounter for closed fracture with routine healing: Secondary | ICD-10-CM | POA: Diagnosis not present

## 2021-03-10 DIAGNOSIS — M25511 Pain in right shoulder: Secondary | ICD-10-CM | POA: Diagnosis not present

## 2021-03-10 DIAGNOSIS — I480 Paroxysmal atrial fibrillation: Secondary | ICD-10-CM | POA: Diagnosis not present

## 2021-03-10 DIAGNOSIS — N179 Acute kidney failure, unspecified: Secondary | ICD-10-CM | POA: Diagnosis not present

## 2021-03-10 DIAGNOSIS — E46 Unspecified protein-calorie malnutrition: Secondary | ICD-10-CM | POA: Diagnosis not present

## 2021-03-11 DIAGNOSIS — I129 Hypertensive chronic kidney disease with stage 1 through stage 4 chronic kidney disease, or unspecified chronic kidney disease: Secondary | ICD-10-CM | POA: Diagnosis not present

## 2021-03-11 DIAGNOSIS — G8929 Other chronic pain: Secondary | ICD-10-CM | POA: Diagnosis not present

## 2021-03-11 DIAGNOSIS — N179 Acute kidney failure, unspecified: Secondary | ICD-10-CM | POA: Diagnosis not present

## 2021-03-11 DIAGNOSIS — M25511 Pain in right shoulder: Secondary | ICD-10-CM | POA: Diagnosis not present

## 2021-03-11 DIAGNOSIS — I480 Paroxysmal atrial fibrillation: Secondary | ICD-10-CM | POA: Diagnosis not present

## 2021-03-11 DIAGNOSIS — E46 Unspecified protein-calorie malnutrition: Secondary | ICD-10-CM | POA: Diagnosis not present

## 2021-03-11 DIAGNOSIS — S72141D Displaced intertrochanteric fracture of right femur, subsequent encounter for closed fracture with routine healing: Secondary | ICD-10-CM | POA: Diagnosis not present

## 2021-03-11 DIAGNOSIS — D631 Anemia in chronic kidney disease: Secondary | ICD-10-CM | POA: Diagnosis not present

## 2021-03-11 DIAGNOSIS — N182 Chronic kidney disease, stage 2 (mild): Secondary | ICD-10-CM | POA: Diagnosis not present

## 2021-03-13 DIAGNOSIS — M25511 Pain in right shoulder: Secondary | ICD-10-CM | POA: Diagnosis not present

## 2021-03-13 DIAGNOSIS — N182 Chronic kidney disease, stage 2 (mild): Secondary | ICD-10-CM | POA: Diagnosis not present

## 2021-03-13 DIAGNOSIS — S72141D Displaced intertrochanteric fracture of right femur, subsequent encounter for closed fracture with routine healing: Secondary | ICD-10-CM | POA: Diagnosis not present

## 2021-03-13 DIAGNOSIS — N179 Acute kidney failure, unspecified: Secondary | ICD-10-CM | POA: Diagnosis not present

## 2021-03-13 DIAGNOSIS — G8929 Other chronic pain: Secondary | ICD-10-CM | POA: Diagnosis not present

## 2021-03-13 DIAGNOSIS — I129 Hypertensive chronic kidney disease with stage 1 through stage 4 chronic kidney disease, or unspecified chronic kidney disease: Secondary | ICD-10-CM | POA: Diagnosis not present

## 2021-03-13 DIAGNOSIS — D631 Anemia in chronic kidney disease: Secondary | ICD-10-CM | POA: Diagnosis not present

## 2021-03-13 DIAGNOSIS — I480 Paroxysmal atrial fibrillation: Secondary | ICD-10-CM | POA: Diagnosis not present

## 2021-03-13 DIAGNOSIS — E46 Unspecified protein-calorie malnutrition: Secondary | ICD-10-CM | POA: Diagnosis not present

## 2021-03-16 DIAGNOSIS — I480 Paroxysmal atrial fibrillation: Secondary | ICD-10-CM | POA: Diagnosis not present

## 2021-03-16 DIAGNOSIS — N182 Chronic kidney disease, stage 2 (mild): Secondary | ICD-10-CM | POA: Diagnosis not present

## 2021-03-16 DIAGNOSIS — M25511 Pain in right shoulder: Secondary | ICD-10-CM | POA: Diagnosis not present

## 2021-03-16 DIAGNOSIS — E46 Unspecified protein-calorie malnutrition: Secondary | ICD-10-CM | POA: Diagnosis not present

## 2021-03-16 DIAGNOSIS — S72141D Displaced intertrochanteric fracture of right femur, subsequent encounter for closed fracture with routine healing: Secondary | ICD-10-CM | POA: Diagnosis not present

## 2021-03-16 DIAGNOSIS — G8929 Other chronic pain: Secondary | ICD-10-CM | POA: Diagnosis not present

## 2021-03-16 DIAGNOSIS — D631 Anemia in chronic kidney disease: Secondary | ICD-10-CM | POA: Diagnosis not present

## 2021-03-16 DIAGNOSIS — N179 Acute kidney failure, unspecified: Secondary | ICD-10-CM | POA: Diagnosis not present

## 2021-03-16 DIAGNOSIS — I129 Hypertensive chronic kidney disease with stage 1 through stage 4 chronic kidney disease, or unspecified chronic kidney disease: Secondary | ICD-10-CM | POA: Diagnosis not present

## 2021-03-17 DIAGNOSIS — G8929 Other chronic pain: Secondary | ICD-10-CM | POA: Diagnosis not present

## 2021-03-17 DIAGNOSIS — N179 Acute kidney failure, unspecified: Secondary | ICD-10-CM | POA: Diagnosis not present

## 2021-03-17 DIAGNOSIS — D631 Anemia in chronic kidney disease: Secondary | ICD-10-CM | POA: Diagnosis not present

## 2021-03-17 DIAGNOSIS — N182 Chronic kidney disease, stage 2 (mild): Secondary | ICD-10-CM | POA: Diagnosis not present

## 2021-03-17 DIAGNOSIS — M25511 Pain in right shoulder: Secondary | ICD-10-CM | POA: Diagnosis not present

## 2021-03-17 DIAGNOSIS — S72141D Displaced intertrochanteric fracture of right femur, subsequent encounter for closed fracture with routine healing: Secondary | ICD-10-CM | POA: Diagnosis not present

## 2021-03-17 DIAGNOSIS — I480 Paroxysmal atrial fibrillation: Secondary | ICD-10-CM | POA: Diagnosis not present

## 2021-03-17 DIAGNOSIS — E46 Unspecified protein-calorie malnutrition: Secondary | ICD-10-CM | POA: Diagnosis not present

## 2021-03-17 DIAGNOSIS — I129 Hypertensive chronic kidney disease with stage 1 through stage 4 chronic kidney disease, or unspecified chronic kidney disease: Secondary | ICD-10-CM | POA: Diagnosis not present

## 2021-03-18 DIAGNOSIS — M25511 Pain in right shoulder: Secondary | ICD-10-CM | POA: Diagnosis not present

## 2021-03-18 DIAGNOSIS — D631 Anemia in chronic kidney disease: Secondary | ICD-10-CM | POA: Diagnosis not present

## 2021-03-18 DIAGNOSIS — N179 Acute kidney failure, unspecified: Secondary | ICD-10-CM | POA: Diagnosis not present

## 2021-03-18 DIAGNOSIS — I480 Paroxysmal atrial fibrillation: Secondary | ICD-10-CM | POA: Diagnosis not present

## 2021-03-18 DIAGNOSIS — G8929 Other chronic pain: Secondary | ICD-10-CM | POA: Diagnosis not present

## 2021-03-18 DIAGNOSIS — S72141D Displaced intertrochanteric fracture of right femur, subsequent encounter for closed fracture with routine healing: Secondary | ICD-10-CM | POA: Diagnosis not present

## 2021-03-18 DIAGNOSIS — E46 Unspecified protein-calorie malnutrition: Secondary | ICD-10-CM | POA: Diagnosis not present

## 2021-03-18 DIAGNOSIS — N182 Chronic kidney disease, stage 2 (mild): Secondary | ICD-10-CM | POA: Diagnosis not present

## 2021-03-18 DIAGNOSIS — I129 Hypertensive chronic kidney disease with stage 1 through stage 4 chronic kidney disease, or unspecified chronic kidney disease: Secondary | ICD-10-CM | POA: Diagnosis not present

## 2021-03-20 DIAGNOSIS — G8929 Other chronic pain: Secondary | ICD-10-CM | POA: Diagnosis not present

## 2021-03-20 DIAGNOSIS — N182 Chronic kidney disease, stage 2 (mild): Secondary | ICD-10-CM | POA: Diagnosis not present

## 2021-03-20 DIAGNOSIS — I480 Paroxysmal atrial fibrillation: Secondary | ICD-10-CM | POA: Diagnosis not present

## 2021-03-20 DIAGNOSIS — I129 Hypertensive chronic kidney disease with stage 1 through stage 4 chronic kidney disease, or unspecified chronic kidney disease: Secondary | ICD-10-CM | POA: Diagnosis not present

## 2021-03-20 DIAGNOSIS — E46 Unspecified protein-calorie malnutrition: Secondary | ICD-10-CM | POA: Diagnosis not present

## 2021-03-20 DIAGNOSIS — S72141D Displaced intertrochanteric fracture of right femur, subsequent encounter for closed fracture with routine healing: Secondary | ICD-10-CM | POA: Diagnosis not present

## 2021-03-20 DIAGNOSIS — D631 Anemia in chronic kidney disease: Secondary | ICD-10-CM | POA: Diagnosis not present

## 2021-03-20 DIAGNOSIS — N179 Acute kidney failure, unspecified: Secondary | ICD-10-CM | POA: Diagnosis not present

## 2021-03-20 DIAGNOSIS — M25511 Pain in right shoulder: Secondary | ICD-10-CM | POA: Diagnosis not present

## 2021-03-23 ENCOUNTER — Telehealth: Payer: Self-pay | Admitting: Physician Assistant

## 2021-03-23 DIAGNOSIS — I480 Paroxysmal atrial fibrillation: Secondary | ICD-10-CM | POA: Diagnosis not present

## 2021-03-23 DIAGNOSIS — N179 Acute kidney failure, unspecified: Secondary | ICD-10-CM | POA: Diagnosis not present

## 2021-03-23 DIAGNOSIS — D631 Anemia in chronic kidney disease: Secondary | ICD-10-CM | POA: Diagnosis not present

## 2021-03-23 DIAGNOSIS — M25511 Pain in right shoulder: Secondary | ICD-10-CM | POA: Diagnosis not present

## 2021-03-23 DIAGNOSIS — I129 Hypertensive chronic kidney disease with stage 1 through stage 4 chronic kidney disease, or unspecified chronic kidney disease: Secondary | ICD-10-CM | POA: Diagnosis not present

## 2021-03-23 DIAGNOSIS — E46 Unspecified protein-calorie malnutrition: Secondary | ICD-10-CM | POA: Diagnosis not present

## 2021-03-23 DIAGNOSIS — G8929 Other chronic pain: Secondary | ICD-10-CM | POA: Diagnosis not present

## 2021-03-23 DIAGNOSIS — S72141D Displaced intertrochanteric fracture of right femur, subsequent encounter for closed fracture with routine healing: Secondary | ICD-10-CM | POA: Diagnosis not present

## 2021-03-23 DIAGNOSIS — N182 Chronic kidney disease, stage 2 (mild): Secondary | ICD-10-CM | POA: Diagnosis not present

## 2021-03-24 DIAGNOSIS — I480 Paroxysmal atrial fibrillation: Secondary | ICD-10-CM | POA: Diagnosis not present

## 2021-03-24 DIAGNOSIS — N179 Acute kidney failure, unspecified: Secondary | ICD-10-CM | POA: Diagnosis not present

## 2021-03-24 DIAGNOSIS — S72141D Displaced intertrochanteric fracture of right femur, subsequent encounter for closed fracture with routine healing: Secondary | ICD-10-CM | POA: Diagnosis not present

## 2021-03-24 DIAGNOSIS — N182 Chronic kidney disease, stage 2 (mild): Secondary | ICD-10-CM | POA: Diagnosis not present

## 2021-03-24 DIAGNOSIS — I129 Hypertensive chronic kidney disease with stage 1 through stage 4 chronic kidney disease, or unspecified chronic kidney disease: Secondary | ICD-10-CM | POA: Diagnosis not present

## 2021-03-24 DIAGNOSIS — M25511 Pain in right shoulder: Secondary | ICD-10-CM | POA: Diagnosis not present

## 2021-03-24 DIAGNOSIS — E46 Unspecified protein-calorie malnutrition: Secondary | ICD-10-CM | POA: Diagnosis not present

## 2021-03-24 DIAGNOSIS — G8929 Other chronic pain: Secondary | ICD-10-CM | POA: Diagnosis not present

## 2021-03-24 DIAGNOSIS — D631 Anemia in chronic kidney disease: Secondary | ICD-10-CM | POA: Diagnosis not present

## 2021-03-24 NOTE — Telephone Encounter (Signed)
Assisted living

## 2021-03-25 ENCOUNTER — Other Ambulatory Visit: Payer: Medicare PPO

## 2021-03-26 DIAGNOSIS — F32A Depression, unspecified: Secondary | ICD-10-CM | POA: Diagnosis not present

## 2021-03-26 DIAGNOSIS — D631 Anemia in chronic kidney disease: Secondary | ICD-10-CM | POA: Diagnosis not present

## 2021-03-26 DIAGNOSIS — N182 Chronic kidney disease, stage 2 (mild): Secondary | ICD-10-CM | POA: Diagnosis not present

## 2021-03-26 DIAGNOSIS — I480 Paroxysmal atrial fibrillation: Secondary | ICD-10-CM | POA: Diagnosis not present

## 2021-03-26 DIAGNOSIS — N179 Acute kidney failure, unspecified: Secondary | ICD-10-CM | POA: Diagnosis not present

## 2021-03-26 DIAGNOSIS — G8929 Other chronic pain: Secondary | ICD-10-CM | POA: Diagnosis not present

## 2021-03-26 DIAGNOSIS — I129 Hypertensive chronic kidney disease with stage 1 through stage 4 chronic kidney disease, or unspecified chronic kidney disease: Secondary | ICD-10-CM | POA: Diagnosis not present

## 2021-03-26 DIAGNOSIS — E46 Unspecified protein-calorie malnutrition: Secondary | ICD-10-CM | POA: Diagnosis not present

## 2021-03-26 DIAGNOSIS — R4182 Altered mental status, unspecified: Secondary | ICD-10-CM | POA: Diagnosis not present

## 2021-03-26 DIAGNOSIS — G4700 Insomnia, unspecified: Secondary | ICD-10-CM | POA: Diagnosis not present

## 2021-03-26 DIAGNOSIS — M25511 Pain in right shoulder: Secondary | ICD-10-CM | POA: Diagnosis not present

## 2021-03-26 DIAGNOSIS — F4320 Adjustment disorder, unspecified: Secondary | ICD-10-CM | POA: Diagnosis not present

## 2021-03-26 DIAGNOSIS — S72141D Displaced intertrochanteric fracture of right femur, subsequent encounter for closed fracture with routine healing: Secondary | ICD-10-CM | POA: Diagnosis not present

## 2021-03-30 DIAGNOSIS — E46 Unspecified protein-calorie malnutrition: Secondary | ICD-10-CM | POA: Diagnosis not present

## 2021-03-30 DIAGNOSIS — M25511 Pain in right shoulder: Secondary | ICD-10-CM | POA: Diagnosis not present

## 2021-03-30 DIAGNOSIS — N182 Chronic kidney disease, stage 2 (mild): Secondary | ICD-10-CM | POA: Diagnosis not present

## 2021-03-30 DIAGNOSIS — D631 Anemia in chronic kidney disease: Secondary | ICD-10-CM | POA: Diagnosis not present

## 2021-03-30 DIAGNOSIS — I480 Paroxysmal atrial fibrillation: Secondary | ICD-10-CM | POA: Diagnosis not present

## 2021-03-30 DIAGNOSIS — G8929 Other chronic pain: Secondary | ICD-10-CM | POA: Diagnosis not present

## 2021-03-30 DIAGNOSIS — I129 Hypertensive chronic kidney disease with stage 1 through stage 4 chronic kidney disease, or unspecified chronic kidney disease: Secondary | ICD-10-CM | POA: Diagnosis not present

## 2021-03-30 DIAGNOSIS — S72141D Displaced intertrochanteric fracture of right femur, subsequent encounter for closed fracture with routine healing: Secondary | ICD-10-CM | POA: Diagnosis not present

## 2021-03-30 DIAGNOSIS — N179 Acute kidney failure, unspecified: Secondary | ICD-10-CM | POA: Diagnosis not present

## 2021-04-01 ENCOUNTER — Ambulatory Visit: Payer: Medicare PPO | Admitting: Physician Assistant

## 2021-04-01 DIAGNOSIS — I480 Paroxysmal atrial fibrillation: Secondary | ICD-10-CM | POA: Diagnosis not present

## 2021-04-01 DIAGNOSIS — N182 Chronic kidney disease, stage 2 (mild): Secondary | ICD-10-CM | POA: Diagnosis not present

## 2021-04-01 DIAGNOSIS — N179 Acute kidney failure, unspecified: Secondary | ICD-10-CM | POA: Diagnosis not present

## 2021-04-01 DIAGNOSIS — I129 Hypertensive chronic kidney disease with stage 1 through stage 4 chronic kidney disease, or unspecified chronic kidney disease: Secondary | ICD-10-CM | POA: Diagnosis not present

## 2021-04-01 DIAGNOSIS — S72141D Displaced intertrochanteric fracture of right femur, subsequent encounter for closed fracture with routine healing: Secondary | ICD-10-CM | POA: Diagnosis not present

## 2021-04-01 DIAGNOSIS — M25511 Pain in right shoulder: Secondary | ICD-10-CM | POA: Diagnosis not present

## 2021-04-01 DIAGNOSIS — G8929 Other chronic pain: Secondary | ICD-10-CM | POA: Diagnosis not present

## 2021-04-01 DIAGNOSIS — E46 Unspecified protein-calorie malnutrition: Secondary | ICD-10-CM | POA: Diagnosis not present

## 2021-04-01 DIAGNOSIS — D631 Anemia in chronic kidney disease: Secondary | ICD-10-CM | POA: Diagnosis not present

## 2021-04-02 DIAGNOSIS — R399 Unspecified symptoms and signs involving the genitourinary system: Secondary | ICD-10-CM | POA: Diagnosis not present

## 2021-04-02 DIAGNOSIS — R4189 Other symptoms and signs involving cognitive functions and awareness: Secondary | ICD-10-CM | POA: Diagnosis not present

## 2021-04-02 DIAGNOSIS — I10 Essential (primary) hypertension: Secondary | ICD-10-CM | POA: Diagnosis not present

## 2021-04-02 DIAGNOSIS — Z8673 Personal history of transient ischemic attack (TIA), and cerebral infarction without residual deficits: Secondary | ICD-10-CM | POA: Diagnosis not present

## 2021-04-03 ENCOUNTER — Inpatient Hospital Stay (HOSPITAL_COMMUNITY)
Admission: EM | Admit: 2021-04-03 | Discharge: 2021-04-13 | DRG: 871 | Disposition: A | Discharge status: Hospice | Payer: Medicare PPO | Attending: Internal Medicine | Admitting: Internal Medicine

## 2021-04-03 ENCOUNTER — Emergency Department (HOSPITAL_COMMUNITY): Payer: Medicare PPO

## 2021-04-03 DIAGNOSIS — G9340 Encephalopathy, unspecified: Secondary | ICD-10-CM | POA: Diagnosis not present

## 2021-04-03 DIAGNOSIS — A419 Sepsis, unspecified organism: Principal | ICD-10-CM | POA: Diagnosis present

## 2021-04-03 DIAGNOSIS — I10 Essential (primary) hypertension: Secondary | ICD-10-CM | POA: Diagnosis present

## 2021-04-03 DIAGNOSIS — T424X5A Adverse effect of benzodiazepines, initial encounter: Secondary | ICD-10-CM | POA: Diagnosis not present

## 2021-04-03 DIAGNOSIS — E162 Hypoglycemia, unspecified: Secondary | ICD-10-CM | POA: Diagnosis not present

## 2021-04-03 DIAGNOSIS — J690 Pneumonitis due to inhalation of food and vomit: Secondary | ICD-10-CM | POA: Diagnosis present

## 2021-04-03 DIAGNOSIS — I340 Nonrheumatic mitral (valve) insufficiency: Secondary | ICD-10-CM | POA: Diagnosis present

## 2021-04-03 DIAGNOSIS — R6520 Severe sepsis without septic shock: Secondary | ICD-10-CM | POA: Diagnosis present

## 2021-04-03 DIAGNOSIS — I5032 Chronic diastolic (congestive) heart failure: Secondary | ICD-10-CM | POA: Diagnosis not present

## 2021-04-03 DIAGNOSIS — N179 Acute kidney failure, unspecified: Secondary | ICD-10-CM | POA: Diagnosis not present

## 2021-04-03 DIAGNOSIS — K219 Gastro-esophageal reflux disease without esophagitis: Secondary | ICD-10-CM | POA: Diagnosis present

## 2021-04-03 DIAGNOSIS — R5381 Other malaise: Secondary | ICD-10-CM | POA: Diagnosis not present

## 2021-04-03 DIAGNOSIS — R404 Transient alteration of awareness: Secondary | ICD-10-CM | POA: Diagnosis not present

## 2021-04-03 DIAGNOSIS — I441 Atrioventricular block, second degree: Secondary | ICD-10-CM | POA: Diagnosis present

## 2021-04-03 DIAGNOSIS — E876 Hypokalemia: Secondary | ICD-10-CM | POA: Diagnosis present

## 2021-04-03 DIAGNOSIS — R609 Edema, unspecified: Secondary | ICD-10-CM | POA: Diagnosis not present

## 2021-04-03 DIAGNOSIS — M1990 Unspecified osteoarthritis, unspecified site: Secondary | ICD-10-CM | POA: Diagnosis present

## 2021-04-03 DIAGNOSIS — I4891 Unspecified atrial fibrillation: Secondary | ICD-10-CM | POA: Diagnosis not present

## 2021-04-03 DIAGNOSIS — I130 Hypertensive heart and chronic kidney disease with heart failure and stage 1 through stage 4 chronic kidney disease, or unspecified chronic kidney disease: Secondary | ICD-10-CM | POA: Diagnosis present

## 2021-04-03 DIAGNOSIS — Z955 Presence of coronary angioplasty implant and graft: Secondary | ICD-10-CM

## 2021-04-03 DIAGNOSIS — Z79890 Hormone replacement therapy (postmenopausal): Secondary | ICD-10-CM

## 2021-04-03 DIAGNOSIS — I959 Hypotension, unspecified: Secondary | ICD-10-CM | POA: Diagnosis not present

## 2021-04-03 DIAGNOSIS — I501 Left ventricular failure: Secondary | ICD-10-CM | POA: Diagnosis not present

## 2021-04-03 DIAGNOSIS — Z20822 Contact with and (suspected) exposure to covid-19: Secondary | ICD-10-CM | POA: Diagnosis not present

## 2021-04-03 DIAGNOSIS — Z9049 Acquired absence of other specified parts of digestive tract: Secondary | ICD-10-CM

## 2021-04-03 DIAGNOSIS — Z7189 Other specified counseling: Secondary | ICD-10-CM | POA: Diagnosis not present

## 2021-04-03 DIAGNOSIS — Z8249 Family history of ischemic heart disease and other diseases of the circulatory system: Secondary | ICD-10-CM

## 2021-04-03 DIAGNOSIS — Z801 Family history of malignant neoplasm of trachea, bronchus and lung: Secondary | ICD-10-CM

## 2021-04-03 DIAGNOSIS — E785 Hyperlipidemia, unspecified: Secondary | ICD-10-CM | POA: Diagnosis present

## 2021-04-03 DIAGNOSIS — R4182 Altered mental status, unspecified: Secondary | ICD-10-CM | POA: Diagnosis not present

## 2021-04-03 DIAGNOSIS — J90 Pleural effusion, not elsewhere classified: Secondary | ICD-10-CM | POA: Diagnosis not present

## 2021-04-03 DIAGNOSIS — M2550 Pain in unspecified joint: Secondary | ICD-10-CM | POA: Diagnosis not present

## 2021-04-03 DIAGNOSIS — Z7901 Long term (current) use of anticoagulants: Secondary | ICD-10-CM

## 2021-04-03 DIAGNOSIS — R0902 Hypoxemia: Secondary | ICD-10-CM | POA: Diagnosis not present

## 2021-04-03 DIAGNOSIS — Z66 Do not resuscitate: Secondary | ICD-10-CM | POA: Diagnosis not present

## 2021-04-03 DIAGNOSIS — Z807 Family history of other malignant neoplasms of lymphoid, hematopoietic and related tissues: Secondary | ICD-10-CM

## 2021-04-03 DIAGNOSIS — F0390 Unspecified dementia without behavioral disturbance: Secondary | ICD-10-CM | POA: Diagnosis present

## 2021-04-03 DIAGNOSIS — G9341 Metabolic encephalopathy: Secondary | ICD-10-CM

## 2021-04-03 DIAGNOSIS — J9601 Acute respiratory failure with hypoxia: Principal | ICD-10-CM

## 2021-04-03 DIAGNOSIS — N1832 Chronic kidney disease, stage 3b: Secondary | ICD-10-CM | POA: Diagnosis present

## 2021-04-03 DIAGNOSIS — F32A Depression, unspecified: Secondary | ICD-10-CM | POA: Diagnosis not present

## 2021-04-03 DIAGNOSIS — I161 Hypertensive emergency: Secondary | ICD-10-CM | POA: Diagnosis not present

## 2021-04-03 DIAGNOSIS — I491 Atrial premature depolarization: Secondary | ICD-10-CM | POA: Diagnosis not present

## 2021-04-03 DIAGNOSIS — E039 Hypothyroidism, unspecified: Secondary | ICD-10-CM | POA: Diagnosis present

## 2021-04-03 DIAGNOSIS — I480 Paroxysmal atrial fibrillation: Secondary | ICD-10-CM | POA: Diagnosis not present

## 2021-04-03 DIAGNOSIS — F0391 Unspecified dementia with behavioral disturbance: Secondary | ICD-10-CM | POA: Diagnosis not present

## 2021-04-03 DIAGNOSIS — R0602 Shortness of breath: Secondary | ICD-10-CM | POA: Diagnosis not present

## 2021-04-03 DIAGNOSIS — I517 Cardiomegaly: Secondary | ICD-10-CM | POA: Diagnosis not present

## 2021-04-03 DIAGNOSIS — I169 Hypertensive crisis, unspecified: Secondary | ICD-10-CM | POA: Diagnosis present

## 2021-04-03 DIAGNOSIS — Z515 Encounter for palliative care: Secondary | ICD-10-CM | POA: Diagnosis not present

## 2021-04-03 DIAGNOSIS — Z7401 Bed confinement status: Secondary | ICD-10-CM | POA: Diagnosis not present

## 2021-04-03 DIAGNOSIS — R1310 Dysphagia, unspecified: Secondary | ICD-10-CM | POA: Diagnosis not present

## 2021-04-03 DIAGNOSIS — I2510 Atherosclerotic heart disease of native coronary artery without angina pectoris: Secondary | ICD-10-CM | POA: Diagnosis present

## 2021-04-03 DIAGNOSIS — J9811 Atelectasis: Secondary | ICD-10-CM | POA: Diagnosis not present

## 2021-04-03 DIAGNOSIS — Z79899 Other long term (current) drug therapy: Secondary | ICD-10-CM

## 2021-04-03 DIAGNOSIS — I160 Hypertensive urgency: Secondary | ICD-10-CM | POA: Diagnosis present

## 2021-04-03 DIAGNOSIS — N390 Urinary tract infection, site not specified: Secondary | ICD-10-CM | POA: Diagnosis present

## 2021-04-03 DIAGNOSIS — Z87891 Personal history of nicotine dependence: Secondary | ICD-10-CM

## 2021-04-03 DIAGNOSIS — R0600 Dyspnea, unspecified: Secondary | ICD-10-CM | POA: Diagnosis not present

## 2021-04-03 LAB — I-STAT VENOUS BLOOD GAS, ED
Acid-base deficit: 1 mmol/L (ref 0.0–2.0)
Bicarbonate: 24.8 mmol/L (ref 20.0–28.0)
Calcium, Ion: 1.13 mmol/L — ABNORMAL LOW (ref 1.15–1.40)
HCT: 45 % (ref 36.0–46.0)
Hemoglobin: 15.3 g/dL — ABNORMAL HIGH (ref 12.0–15.0)
O2 Saturation: 87 %
Potassium: 5.1 mmol/L (ref 3.5–5.1)
Sodium: 141 mmol/L (ref 135–145)
TCO2: 26 mmol/L (ref 22–32)
pCO2, Ven: 45.2 mmHg (ref 44.0–60.0)
pH, Ven: 7.347 (ref 7.250–7.430)
pO2, Ven: 56 mmHg — ABNORMAL HIGH (ref 32.0–45.0)

## 2021-04-03 LAB — URINALYSIS, MICROSCOPIC (REFLEX)

## 2021-04-03 LAB — CBC
HCT: 48 % — ABNORMAL HIGH (ref 36.0–46.0)
Hemoglobin: 15.8 g/dL — ABNORMAL HIGH (ref 12.0–15.0)
MCH: 33.1 pg (ref 26.0–34.0)
MCHC: 32.9 g/dL (ref 30.0–36.0)
MCV: 100.4 fL — ABNORMAL HIGH (ref 80.0–100.0)
Platelets: 230 10*3/uL (ref 150–400)
RBC: 4.78 MIL/uL (ref 3.87–5.11)
RDW: 13.3 % (ref 11.5–15.5)
WBC: 18.3 10*3/uL — ABNORMAL HIGH (ref 4.0–10.5)
nRBC: 0 % (ref 0.0–0.2)

## 2021-04-03 LAB — POCT I-STAT 7, (LYTES, BLD GAS, ICA,H+H)
Acid-Base Excess: 0 mmol/L (ref 0.0–2.0)
Bicarbonate: 23.6 mmol/L (ref 20.0–28.0)
Calcium, Ion: 1.24 mmol/L (ref 1.15–1.40)
HCT: 41 % (ref 36.0–46.0)
Hemoglobin: 13.9 g/dL (ref 12.0–15.0)
O2 Saturation: 99 %
Patient temperature: 97.8
Potassium: 3.9 mmol/L (ref 3.5–5.1)
Sodium: 142 mmol/L (ref 135–145)
TCO2: 25 mmol/L (ref 22–32)
pCO2 arterial: 35.1 mmHg (ref 32.0–48.0)
pH, Arterial: 7.435 (ref 7.350–7.450)
pO2, Arterial: 120 mmHg — ABNORMAL HIGH (ref 83.0–108.0)

## 2021-04-03 LAB — MRSA PCR SCREENING: MRSA by PCR: POSITIVE — AB

## 2021-04-03 LAB — COMPREHENSIVE METABOLIC PANEL
ALT: 10 U/L (ref 0–44)
AST: 29 U/L (ref 15–41)
Albumin: 3.3 g/dL — ABNORMAL LOW (ref 3.5–5.0)
Alkaline Phosphatase: 130 U/L — ABNORMAL HIGH (ref 38–126)
Anion gap: 11 (ref 5–15)
BUN: 29 mg/dL — ABNORMAL HIGH (ref 8–23)
CO2: 25 mmol/L (ref 22–32)
Calcium: 9.4 mg/dL (ref 8.9–10.3)
Chloride: 105 mmol/L (ref 98–111)
Creatinine, Ser: 1.16 mg/dL — ABNORMAL HIGH (ref 0.44–1.00)
GFR, Estimated: 45 mL/min — ABNORMAL LOW (ref >60)
Glucose, Bld: 93 mg/dL (ref 70–99)
Potassium: 4.7 mmol/L (ref 3.5–5.1)
Sodium: 141 mmol/L (ref 135–145)
Total Bilirubin: 1.4 mg/dL — ABNORMAL HIGH (ref 0.3–1.2)
Total Protein: 7.4 g/dL (ref 6.5–8.1)

## 2021-04-03 LAB — CBC WITH DIFFERENTIAL/PLATELET
Abs Immature Granulocytes: 0.14 10*3/uL — ABNORMAL HIGH (ref 0.00–0.07)
Basophils Absolute: 0 10*3/uL (ref 0.0–0.1)
Basophils Relative: 0 %
Eosinophils Absolute: 0 10*3/uL (ref 0.0–0.5)
Eosinophils Relative: 0 %
HCT: 47.3 % — ABNORMAL HIGH (ref 36.0–46.0)
Hemoglobin: 15 g/dL (ref 12.0–15.0)
Immature Granulocytes: 1 %
Lymphocytes Relative: 11 %
Lymphs Abs: 1.2 10*3/uL (ref 0.7–4.0)
MCH: 32.5 pg (ref 26.0–34.0)
MCHC: 31.7 g/dL (ref 30.0–36.0)
MCV: 102.6 fL — ABNORMAL HIGH (ref 80.0–100.0)
Monocytes Absolute: 0.9 10*3/uL (ref 0.1–1.0)
Monocytes Relative: 8 %
Neutro Abs: 8.8 10*3/uL — ABNORMAL HIGH (ref 1.7–7.7)
Neutrophils Relative %: 80 %
Platelets: 252 10*3/uL (ref 150–400)
RBC: 4.61 MIL/uL (ref 3.87–5.11)
RDW: 13.4 % (ref 11.5–15.5)
WBC: 11 10*3/uL — ABNORMAL HIGH (ref 4.0–10.5)
nRBC: 0 % (ref 0.0–0.2)

## 2021-04-03 LAB — URINALYSIS, ROUTINE W REFLEX MICROSCOPIC
Glucose, UA: NEGATIVE mg/dL
Ketones, ur: NEGATIVE mg/dL
Leukocytes,Ua: NEGATIVE
Nitrite: NEGATIVE
Protein, ur: 100 mg/dL — AB
Specific Gravity, Urine: >1.03 — ABNORMAL HIGH (ref 1.005–1.030)
pH: 5 (ref 5.0–8.0)

## 2021-04-03 LAB — CREATININE, SERUM
Creatinine, Ser: 1.51 mg/dL — ABNORMAL HIGH (ref 0.44–1.00)
GFR, Estimated: 33 mL/min — ABNORMAL LOW (ref >60)

## 2021-04-03 LAB — LACTIC ACID, PLASMA
Lactic Acid, Venous: 2.4 mmol/L (ref 0.5–1.9)
Lactic Acid, Venous: 3.5 mmol/L (ref 0.5–1.9)
Lactic Acid, Venous: 3.1 mmol/L (ref 0.5–1.9)

## 2021-04-03 LAB — TROPONIN I (HIGH SENSITIVITY): Troponin I (High Sensitivity): 38 ng/L — ABNORMAL HIGH (ref <18)

## 2021-04-03 LAB — RESP PANEL BY RT-PCR (FLU A&B, COVID) ARPGX2
Influenza A by PCR: NEGATIVE
Influenza B by PCR: NEGATIVE
SARS Coronavirus 2 by RT PCR: NEGATIVE

## 2021-04-03 LAB — BRAIN NATRIURETIC PEPTIDE: B Natriuretic Peptide: 1105.1 pg/mL — ABNORMAL HIGH (ref 0.0–100.0)

## 2021-04-03 LAB — GLUCOSE, CAPILLARY: Glucose-Capillary: 78 mg/dL (ref 70–99)

## 2021-04-03 MED ORDER — FUROSEMIDE 10 MG/ML IJ SOLN
40.0000 mg | Freq: Once | INTRAMUSCULAR | Status: AC
  Administered 2021-04-03: 40 mg via INTRAVENOUS
  Filled 2021-04-03: qty 4

## 2021-04-03 MED ORDER — LORAZEPAM 2 MG/ML IJ SOLN: 1.0000 mg | Freq: Once | INTRAMUSCULAR | Status: AC

## 2021-04-03 MED ORDER — LORAZEPAM 2 MG/ML IJ SOLN
INTRAMUSCULAR | Status: AC
  Administered 2021-04-03: 1 mg via INTRAVENOUS
  Filled 2021-04-03: qty 1

## 2021-04-03 MED ORDER — POLYETHYLENE GLYCOL 3350 17 G PO PACK
17.0000 g | PACK | Freq: Every day | ORAL | Status: DC | PRN
  Filled 2021-04-03: qty 1

## 2021-04-03 MED ORDER — MAGNESIUM SULFATE 2 GM/50ML IV SOLN
2.0000 g | Freq: Once | INTRAVENOUS | Status: AC
  Administered 2021-04-03: 2 g via INTRAVENOUS
  Filled 2021-04-03: qty 50

## 2021-04-03 MED ORDER — CHLORHEXIDINE GLUCONATE 0.12 % MT SOLN
15.0000 mL | Freq: Two times a day (BID) | OROMUCOSAL | Status: DC
  Administered 2021-04-04: 15 mL via OROMUCOSAL

## 2021-04-03 MED ORDER — SODIUM CHLORIDE 0.9 % IV SOLN: INTRAVENOUS | Status: AC

## 2021-04-03 MED ORDER — CHLORHEXIDINE GLUCONATE CLOTH 2 % EX PADS
6.0000 | MEDICATED_PAD | Freq: Every day | CUTANEOUS | Status: DC
  Administered 2021-04-03 – 2021-04-13 (×9): 6 via TOPICAL

## 2021-04-03 MED ORDER — MUPIROCIN 2 % EX OINT
1.0000 "application " | TOPICAL_OINTMENT | Freq: Two times a day (BID) | CUTANEOUS | Status: AC
  Administered 2021-04-04 – 2021-04-08 (×10): 1 via NASAL
  Filled 2021-04-03: qty 22

## 2021-04-03 MED ORDER — ONDANSETRON HCL 4 MG/2ML IJ SOLN
4.0000 mg | Freq: Four times a day (QID) | INTRAMUSCULAR | Status: DC | PRN
  Administered 2021-04-10: 4 mg via INTRAVENOUS
  Filled 2021-04-03: qty 2

## 2021-04-03 MED ORDER — HYDRALAZINE HCL 20 MG/ML IJ SOLN
10.0000 mg | Freq: Four times a day (QID) | INTRAMUSCULAR | Status: DC | PRN
  Administered 2021-04-06 – 2021-04-10 (×3): 10 mg via INTRAVENOUS
  Filled 2021-04-03 (×4): qty 1

## 2021-04-03 MED ORDER — ORAL CARE MOUTH RINSE: 15.0000 mL | Freq: Two times a day (BID) | OROMUCOSAL | Status: DC

## 2021-04-03 MED ORDER — DOCUSATE SODIUM 100 MG PO CAPS
100.0000 mg | ORAL_CAPSULE | Freq: Two times a day (BID) | ORAL | Status: DC | PRN
  Filled 2021-04-03: qty 1

## 2021-04-03 MED ORDER — HEPARIN SODIUM (PORCINE) 5000 UNIT/ML IJ SOLN
5000.0000 [IU] | Freq: Three times a day (TID) | INTRAMUSCULAR | Status: DC
  Administered 2021-04-03 – 2021-04-08 (×14): 5000 [IU] via SUBCUTANEOUS
  Filled 2021-04-03 (×14): qty 1

## 2021-04-03 MED ORDER — SODIUM CHLORIDE 0.9 % IV SOLN
1.0000 g | INTRAVENOUS | Status: DC
  Administered 2021-04-03 – 2021-04-09 (×7): 1 g via INTRAVENOUS
  Filled 2021-04-03 (×3): qty 10
  Filled 2021-04-03 (×3): qty 1
  Filled 2021-04-03: qty 10
  Filled 2021-04-03: qty 1

## 2021-04-03 MED ORDER — NITROGLYCERIN IN D5W 200-5 MCG/ML-% IV SOLN
0.0000 ug/min | INTRAVENOUS | Status: DC
  Administered 2021-04-03: 50 ug/min via INTRAVENOUS
  Administered 2021-04-03: 5 ug/min via INTRAVENOUS
  Filled 2021-04-03 (×2): qty 250

## 2021-04-03 NOTE — ED Notes (Signed)
Switched to nonrebreather at 15LPM at this time.

## 2021-04-03 NOTE — ED Notes (Signed)
Switched to bipap at this time. RT at bedside.

## 2021-04-03 NOTE — Progress Notes (Signed)
eLink Physician-Brief Progress Note Patient Name: Christina Ortiz DOB: October 16, 1929 MRN: [id number]   Date of Service  04/03/2021  HPI/Events of Note  80F with Afib on Xarelto, CKD, admitted with acute hypoxemic respiratory failure thought secondary to aspiration event. She was also diagnosed with a UTI 1 day prior to admission per records.  On my exam via camera, the patient rouses but is poorly communicative. On BiPAP. Sats are in the mid 90s. Tachypneic to 30/min.   eICU Interventions  # Neuro: - Encephalopathy: suspect secondary to delirium in context of infection/respiratory failure. Supportive care.  # Respiratory: - Based on my initial assessment of this patient, I did have the bedside team (Dr. Ardeth Perfect) come and assess the patient's respiratory status. The outcome of that assessment was that the patient would remain on BiPAP at this time under close monitoring for any signs of worsening respiratory failure. - Treatment with antibiotics for possible aspiration pneumonia.  # Cardiac: - TTE in AM. - Gentle hydration given suspicion for infection. - Afib: Xarelto on hold given that patient unable to take PO at this time.  # ID: - Ceftriaxone for coverage of aspiration.  # GI: - NPO for now.  DVT PPX: Heparin [place]. GI PPX: Not indicated.      Intervention Category Evaluation Type: New Patient Evaluation  Janae Bridgeman 04/03/2021, 8:18 PM

## 2021-04-03 NOTE — Progress Notes (Signed)
RT assisted with transportation of pt from ED room 18 to CT while on BiPAP. Pt tolerated well with SVS.

## 2021-04-03 NOTE — ED Provider Notes (Signed)
[facility] EMERGENCY DEPARTMENT Provider Note   CSN: [id number] Arrival date & time: 04/03/21  1320     History Chief Complaint  Patient presents with  . Shortness of Breath    Christina Ortiz is a 87 y.o. female.  Patient from [facility], CKD, history of ACS, paroxysmal atrial fibrillation on Xarelto -- presents for altered mental status and hypoxia.  Level 5 caveat due to altered mental status.  Patient presents by EMS on oxygen.  She was found at the facility to have oxygen saturation in the 70s on room air.  They report recent vomiting episode.  Also reportedly diagnosed with urinary tract infection yesterday.  Patient is able to tell me her name and that she is in the hospital.  She currently has no complaints.  She is on nasal cannula at 4 L with oxygen saturation in the low 90s.  Deep gurgling respirations noted.  No DNR reported.  Patient daughter now at bedside.  States that she was called while going to visit patient today.  She was told that she was lethargic this morning and had an episode of vomiting.  Breathing is very abnormal.  Patient is very restless per her report.  It was initially reported patient was ESRD --daughter confirms no dialysis.        Past Medical History:  Diagnosis Date  . Acute bronchitis 10/2017  . Arthritis   . CKD (chronic kidney disease)    Xarelto dose is 15 mg QD  . Coronary artery disease    a. s/p PCI/stenting to RCA and LAD ([facility] in [place]) in 2006; b. Nuc 1/15: No ischemia, EF 70%  . Depression   . Gallstones   . Gastritis   . GERD (gastroesophageal reflux disease)   . Hiatal hernia   . Hip fracture (HCC)   . History of cholecystectomy   . History of echocardiogram    a. Echo 1/15: Moderate LVH, EF 55-60%, impaired relaxation, mild AS  . Hyperlipidemia   . Hypertension   . Hypokalemia   . Hypothyroidism   . Mobitz type 1 second degree atrioventricular block    a. Event Monitor 3/15:  NSR, first-degree AV block, second-degree AB block - Mobitz 1, PACs, NSVT (5 beats)  . PAF (paroxysmal atrial fibrillation) (HCC)   . Thyroid disease     Patient Active Problem List   Diagnosis Date Noted  . Hip fx, right, closed, initial encounter (HCC) 01/24/2021  . Right hip pain 01/24/2021  . Leukocytosis 01/24/2021  . Depression 01/24/2021  . Surgical counseling visit 12/10/2019  . History of esophageal stricture 11/02/2019  . Muscular deconditioning 11/01/2019  . Abnormal liver function tests   . Common bile duct calculus 10/16/2019  . Abnormal urinalysis 09/28/2019  . Adult failure to thrive syndrome 09/28/2019  . Dysphagia 09/06/2018  . Pain   . Closed fracture of femur, greater trochanter (HCC) 06/04/2018  . Hypoxia 06/03/2018  . Pain in right arm 04/10/2018  . Fall 03/31/2018  . Fracture of shaft of humerus 03/31/2018  . Disorder of skeletal muscle 03/31/2018  . Hypothermia 03/31/2018  . Healthcare maintenance 01/17/2018  . Nocturia 01/17/2018  . Macrocytosis 11/22/2017  . Acute bronchitis due to respiratory syncytial virus 11/09/2017  . Acute respiratory failure (HCC) 11/09/2017  . Hypertension 11/09/2017  . Paroxysmal atrial fibrillation (HCC) 11/09/2017  . Coronary artery disease 11/09/2017  . CKD (chronic kidney disease) stage 2, GFR 60-89 ml/min 11/09/2017  . Hypothyroidism 11/09/2017  .  Dyspnea 11/08/2017  . Upper respiratory infection 11/08/2017  . Serum creatinine raised 06/21/2017  . Ankle edema 06/21/2017  . Diarrhea 05/25/2017  . Hypokalemia 05/25/2017  . Alcohol abuse 05/25/2017  . Recurrent depression (HCC) 03/30/2017  . Low back pain 03/30/2017  . Influenza 01/22/2017  . Coronary atherosclerosis 04/04/2015  . Hypertensive disorder 04/04/2015  . Hyperlipidemia 04/04/2015  . Ex-smoker 04/04/2015  . Chronic anticoagulation 04/04/2015    Past Surgical History:  Procedure Laterality Date  . ANGIOPLASTY  2006   with sent placement  . BACK  SURGERY     Dr Shon Baton  . BILIARY DILATION  10/18/2019   Procedure: BILIARY DILATION;  Surgeon: Rachael Fee, MD;  Location: [facility] ENDOSCOPY;  Service: Endoscopy;;  . BILIARY DILATION  12/17/2019   Procedure: BILIARY DILATION;  Surgeon: [name], MD;  Location: [facility] ENDOSCOPY;  Service: Endoscopy;;  . BILIARY DILATION  02/07/2020   Procedure: BILIARY DILATION;  Surgeon: Lemar Lofty., MD;  Location: [facility] ENDOSCOPY;  Service: Gastroenterology;;  . BILIARY DILATION  09/10/2020   Procedure: BILIARY DILATION;  Surgeon: Lemar Lofty., MD;  Location: [facility] ENDOSCOPY;  Service: Gastroenterology;;  . BILIARY STENT PLACEMENT N/A 10/18/2019   Procedure: BILIARY STENT PLACEMENT;  Surgeon: Rachael Fee, MD;  Location: [facility] ENDOSCOPY;  Service: Endoscopy;  Laterality: N/A;  . BILIARY STENT PLACEMENT N/A 12/17/2019   Procedure: BILIARY STENT PLACEMENT;  Surgeon: [name], MD;  Location: [facility] ENDOSCOPY;  Service: Endoscopy;  Laterality: N/A;  . BILIARY STENT PLACEMENT  02/07/2020   Procedure: BILIARY STENT PLACEMENT;  Surgeon: Meridee Score Netty Starring., MD;  Location: [facility] ENDOSCOPY;  Service: Gastroenterology;;  . BLEPHAROPLASTY    . BREAST LUMPECTOMY Left   . CARDIAC CATHETERIZATION  2006  . CATARACT EXTRACTION Bilateral   . CHOLECYSTECTOMY    . DILATION AND CURETTAGE OF UTERUS    . ENDOSCOPIC RETROGRADE CHOLANGIOPANCREATOGRAPHY (ERCP) WITH PROPOFOL N/A 12/17/2019   Procedure: ENDOSCOPIC RETROGRADE CHOLANGIOPANCREATOGRAPHY (ERCP) WITH PROPOFOL;  Surgeon: [name], MD;  Location: [facility] ENDOSCOPY;  Service: Endoscopy;  Laterality: N/A;  . ENDOSCOPIC RETROGRADE CHOLANGIOPANCREATOGRAPHY (ERCP) WITH PROPOFOL N/A 02/07/2020   Procedure: ENDOSCOPIC RETROGRADE CHOLANGIOPANCREATOGRAPHY (ERCP) WITH PROPOFOL;  Surgeon: Meridee Score Netty Starring., MD;  Location: [facility] ENDOSCOPY;  Service: Gastroenterology;  Laterality: N/A;  . ENDOSCOPIC RETROGRADE CHOLANGIOPANCREATOGRAPHY (ERCP) WITH PROPOFOL  N/A 09/10/2020   Procedure: ENDOSCOPIC RETROGRADE CHOLANGIOPANCREATOGRAPHY (ERCP) WITH PROPOFOL;  Surgeon: Meridee Score Netty Starring., MD;  Location: [facility] ENDOSCOPY;  Service: Gastroenterology;  Laterality: N/A;  . ERCP N/A 10/18/2019   Procedure: ENDOSCOPIC RETROGRADE CHOLANGIOPANCREATOGRAPHY (ERCP);  Surgeon: Rachael Fee, MD;  Location: [facility] ENDOSCOPY;  Service: Endoscopy;  Laterality: N/A;  . ESOPHAGOGASTRODUODENOSCOPY (EGD) WITH PROPOFOL N/A 12/17/2019   Procedure: ESOPHAGOGASTRODUODENOSCOPY (EGD) WITH PROPOFOL;  Surgeon: [name], MD;  Location: [facility] ENDOSCOPY;  Service: Endoscopy;  Laterality: N/A;  . INTRAMEDULLARY (IM) NAIL INTERTROCHANTERIC Right 01/25/2021   Procedure: INTRAMEDULLARY (IM) NAIL INTERTROCHANTRIC;  Surgeon: Durene Romans, MD;  Location: [facility] OR;  Service: Orthopedics;  Laterality: Right;  . KYPHOPLASTY N/A 01/03/2020   Procedure: KYPHOPLASTY LUMBAR TWO;  Surgeon: Venita Lick, MD;  Location: [facility] OR;  Service: Orthopedics;  Laterality: N/A;  . REMOVAL OF STONES  10/18/2019   Procedure: REMOVAL OF STONES;  Surgeon: Rachael Fee, MD;  Location: [facility] ENDOSCOPY;  Service: Endoscopy;;  . REMOVAL OF STONES  12/17/2019   Procedure: REMOVAL OF STONES;  Surgeon: [name], MD;  Location: [facility] ENDOSCOPY;  Service: Endoscopy;;  . REMOVAL OF STONES  02/07/2020  Procedure: REMOVAL OF STONES;  Surgeon: Meridee Score Netty Starring., MD;  Location: [facility] ENDOSCOPY;  Service: Gastroenterology;;  . REMOVAL OF STONES  09/10/2020   Procedure: REMOVAL OF STONES;  Surgeon: Lemar Lofty., MD;  Location: [facility] ENDOSCOPY;  Service: Gastroenterology;;  . Gaspar Bidding DILATION N/A 12/17/2019   Procedure: Gaspar Bidding DILATION;  Surgeon: [name], MD;  Location: [facility] ENDOSCOPY;  Service: Endoscopy;  Laterality: N/A;  . SPHINCTEROTOMY  10/18/2019   Procedure: SPHINCTEROTOMY;  Surgeon: Rachael Fee, MD;  Location: [facility] ENDOSCOPY;  Service: Endoscopy;;  . Dennison Mascot  12/17/2019   Procedure:  Dennison Mascot;  Surgeon: [name], MD;  Location: [facility] ENDOSCOPY;  Service: Endoscopy;;  . Burman Freestone CHOLANGIOSCOPY N/A 02/07/2020   Procedure: PRFFMBWG CHOLANGIOSCOPY;  Surgeon: Lemar Lofty., MD;  Location[facility] ENDOSCOPY;  Service: Gastroenterology;  Laterality: N/A;  Burman Freestone LITHOTRIPSY N/A 02/07/2020   Procedure: YKZLDJTT LITHOTRIPSY;  Surgeon: Lemar Lofty., MD;  Location: [facility] ENDOSCOPY;  Service: Gastroenterology;  Laterality: N/A;  . STENT REMOVAL  12/17/2019   Procedure: STENT REMOVAL;  Surgeon: [name], MD;  Location: [facility] ENDOSCOPY;  Service: Endoscopy;;  . Francine Graven REMOVAL  02/07/2020   Procedure: STENT REMOVAL;  Surgeon: Lemar Lofty., MD;  Location: [facility] ENDOSCOPY;  Service: Gastroenterology;;  . Francine Graven REMOVAL  09/10/2020   Procedure: STENT REMOVAL;  Surgeon: Lemar Lofty., MD;  Location: [facility] ENDOSCOPY;  Service: Gastroenterology;;     OB History   No obstetric history on file.     Family History  Problem Relation Age of Onset  . Heart disease Mother   . Heart attack Mother   . Lung cancer Father   . Lymphoma Brother   . Heart failure Brother   . Esophageal cancer Paternal Uncle   . Colon cancer Neg Hx   . Colon polyps Neg Hx   . Rectal cancer Neg Hx   . Stomach cancer Neg Hx     Social History   Tobacco Use  . Smoking status: Former Smoker    Packs/day: 0.50    Years: 67.00    Pack years: 33.50    Types: Cigarettes    Quit date: 10/24/2014    Years since quitting: 6.4  . Smokeless tobacco: Never Used  . Tobacco comment: quit 2015  Vaping Use  . Vaping Use: Never used  Substance Use Topics  . Alcohol use: Yes    Alcohol/week: 4.0 standard drinks    Types: 4 Shots of liquor per week    Comment: vodka  . Drug use: No    Home Medications Prior to Admission medications   Medication Sig Start Date End Date Taking? Authorizing Provider  acetaminophen (TYLENOL) 325 MG tablet Take 650 mg by mouth every 6 (six)  hours as needed for moderate pain (for pain.).     [provider]  antiseptic oral rinse (BIOTENE) LIQD 15 mLs by Mouth Rinse route daily.    [provider]  buPROPion (WELLBUTRIN XL) 150 MG 24 hr tablet Take 1 tablet (150 mg total) by mouth daily. 07/11/20   Mayer Masker, PA-C  CALCIUM-MAGNESIUM-ZINC PO Take 1 tablet by mouth daily.     [provider]  ferrous sulfate 325 (65 FE) MG tablet Take 1 tablet (325 mg total) by mouth daily with breakfast for 14 days. 01/30/21 02/13/21  Albertine Grates, MD  folic acid (FOLVITE) 1 MG tablet Take 1 tablet (1 mg total) by mouth daily. 01/07/21   Mayer Masker, PA-C  HYDROcodone-acetaminophen (NORCO) 5-325 MG  tablet Take 1-2 tablets by mouth every 6 (six) hours as needed for moderate pain or severe pain. 01/27/21   Lanney GinsBabish, Matthew, PA-C  levothyroxine (SYNTHROID) 75 MCG tablet Take 1 tablet (75 mcg total) by mouth daily before breakfast. **PATIENT NEEDS LAB FOR FURTHER REFILLS** 12/01/20   Abonza, Maritza, PA-C  lidocaine (LIDODERM) 5 % Place 1 patch onto the skin daily. Remove & Discard patch within 12 hours or as directed by MD    [provider]  metoprolol tartrate (LOPRESSOR) 25 MG tablet Take 0.5 tablets (12.5 mg total) by mouth 2 (two) times daily. **NEEDS APT FOR FURTHER MED REFILLS** 12/01/20   Abonza, Maritza, PA-C  omeprazole (PRILOSEC) 20 MG capsule TAKE 1 CAPSULE BY MOUTH DAILY AS NEEDED FOR HEARTBURN. Patient taking differently: Take 20 mg by mouth daily as needed (heartburn). 09/04/19   Danford, Orpha BurKaty D, NP  PARoxetine (PAXIL) 20 MG tablet Take 1 tab by mouth daily for 2 weeks then increase to 2 tabs by mouth daily Patient taking differently: Take 40 mg by mouth daily. 01/01/21   Mayer MaskerAbonza, Maritza, PA-C  polyethylene glycol (MIRALAX / GLYCOLAX) 17 g packet Take 17 g by mouth daily. 01/30/21   Albertine GratesXu, Fang, MD  potassium chloride SA (KLOR-CON) 20 MEQ tablet Take 1 tablet (20 mEq total) by mouth daily. Please keep upcoming schedule  for future refill Patient taking differently: Take 20 mEq by mouth daily. 12/17/20   Jake BatheSkains, Mark C, MD  Propylene Glycol 0.6 % SOLN Place 1 drop into both eyes daily.    [provider]  senna-docusate (SENOKOT-S) 8.6-50 MG tablet Take 1 tablet by mouth at bedtime. 01/30/21   Albertine GratesXu, Fang, MD  simvastatin (ZOCOR) 5 MG tablet TAKE 1 TABLET BY MOUTH EVERY EVENING. Patient taking differently: Take 5 mg by mouth every evening. 08/07/20   Mayer MaskerAbonza, Maritza, PA-C  vitamin B-12 100 MCG tablet Take 1 tablet (100 mcg total) by mouth daily. 01/31/21   Albertine GratesXu, Fang, MD  VITAMIN D PO Take 1 tablet by mouth at bedtime.    [provider]  XARELTO 15 MG TABS tablet TAKE 1 TABLET BY MOUTH DAILY WITH SUPPER. Patient taking differently: Take 15 mg by mouth daily with supper. 12/22/20   Jake BatheSkains, Mark C, MD    Allergies    Patient has no known allergies.  Review of Systems   Review of Systems  Unable to perform ROS: Mental status change    Physical Exam Updated Vital Signs BP (!) 198/116 (BP Location: Right Arm)   Pulse 81   Temp 98.7 F (37.1 C) (Oral)   Resp (!) 24   Ht 5\' 2"  (1.575 m)   Wt 45.4 kg   LMP  (LMP Unknown)   SpO2 94%   BMI 18.29 kg/m   Physical Exam Vitals and nursing note reviewed.  Constitutional:      General: She is not in acute distress.    Appearance: She is well-developed.  HENT:     Head: Normocephalic and atraumatic.     Right Ear: External ear normal.     Left Ear: External ear normal.     Nose: Nose normal.  Eyes:     Conjunctiva/sclera: Conjunctivae normal.  Cardiovascular:     Rate and Rhythm: Normal rate. Rhythm irregular.     Heart sounds: No murmur heard.   Pulmonary:     Effort: No respiratory distress.     Breath sounds: Rhonchi present. No wheezing or rales.     Comments: Patient with  significant coarse upper airway sounds. Abdominal:     Palpations: Abdomen is soft.     Tenderness: There is no abdominal tenderness. There is no guarding or  rebound.  Musculoskeletal:     Cervical back: Normal range of motion and neck supple.     Right lower leg: No tenderness. No edema.     Left lower leg: No tenderness. No edema.     Comments: No lower extremity edema noted  Skin:    General: Skin is warm and dry.     Findings: No rash.  Neurological:     Mental Status: She is alert. Mental status is at baseline. She is disoriented.     Comments: Patient is able to tell me her name and that she is at the hospital.  She is unable to tell me the date, year, city.  Psychiatric:        Mood and Affect: Mood normal.     ED Results / Procedures / Treatments   Labs (all labs ordered are listed, but only abnormal results are displayed) Labs Reviewed  CBC WITH DIFFERENTIAL/PLATELET - Abnormal; Notable for the following components:      Result Value   WBC 11.0 (*)    HCT 47.3 (*)    MCV 102.6 (*)    Neutro Abs 8.8 (*)    Abs Immature Granulocytes 0.14 (*)    All other components within normal limits  COMPREHENSIVE METABOLIC PANEL - Abnormal; Notable for the following components:   BUN 29 (*)    Creatinine, Ser 1.16 (*)    Albumin 3.3 (*)    Alkaline Phosphatase 130 (*)    Total Bilirubin 1.4 (*)    GFR, Estimated 45 (*)    All other components within normal limits  LACTIC ACID, PLASMA - Abnormal; Notable for the following components:   Lactic Acid, Venous 2.4 (*)    All other components within normal limits  I-STAT VENOUS BLOOD GAS, ED - Abnormal; Notable for the following components:   pO2, Ven 56.0 (*)    Calcium, Ion 1.13 (*)    Hemoglobin 15.3 (*)    All other components within normal limits  TROPONIN I (HIGH SENSITIVITY) - Abnormal; Notable for the following components:   Troponin I (High Sensitivity) 38 (*)    All other components within normal limits  RESP PANEL BY RT-PCR (FLU A&B, COVID) [id number]  URINE CULTURE  URINALYSIS, ROUTINE W REFLEX MICROSCOPIC  BRAIN NATRIURETIC PEPTIDE  LACTIC ACID, PLASMA    ED ECG  REPORT   Date: 04/03/2021  Rate: 83  Rhythm: sinus arrhythmia  QRS Axis: right  Intervals: normal  ST/T Wave abnormalities: nonspecific T wave changes  Conduction Disutrbances:none  Narrative Interpretation:   Old EKG Reviewed: none available and unchanged  I have personally reviewed the EKG tracing and agree with the computerized printout as noted.  Radiology DG Chest Port 1 View  Result Date: 04/03/2021 CLINICAL DATA:  Shortness of breath and decreased oxygen saturation EXAM: PORTABLE CHEST 1 VIEW COMPARISON:  July 23, 2020 FINDINGS: There is atelectatic change in the left base region. No edema or airspace opacity. There is again noted generalized cardiac enlargement with pulmonary vascularity normal. No adenopathy. There is aortic atherosclerosis. No bone lesions. IMPRESSION: Persistent cardiomegaly. Left base atelectasis. Lungs elsewhere clear. No adenopathy evident. Aortic Atherosclerosis (ICD10-I70.0). Electronically Signed   By: Bretta Bang III M.D.   On: 04/03/2021 14:42    Procedures Procedures   Medications Ordered in ED Medications  nitroGLYCERIN 50 mg in dextrose 5 % 250 mL (0.2 mg/mL) infusion (175 mcg/min Intravenous Rate/Dose Change 04/03/21 1455)    ED Course  I have reviewed the triage vital signs and the nursing notes.  Pertinent labs & imaging results that were available during my care of the patient were reviewed by me and considered in my medical decision making (see chart for details).  Patient seen and examined.  She is awake and alert on 4 L nasal cannula.  Will likely need admission to the hospital.  Work-up initiated.  Blood pressure elevated.    Vital signs reviewed and are as follows: BP (!) 198/116 (BP Location: Right Arm)   Pulse 81   Temp 98.7 F (37.1 C) (Oral)   Resp (!) 24   Ht 5\' 2"  (1.575 m)   Wt 45.4 kg   LMP  (LMP Unknown)   SpO2 94%   BMI 18.29 kg/m   Patient more restless.  Daughter now bedside.  Seen with Dr. .  Evaluated  with bedside Myrtis Ser. B-lines noted.  Will add nitroglycerin, BiPAP.  Portable chest at bedside and reviewed.   2:56 PM Pt on BiPAP.  BP improved 170/100. O2 sat 94-95.    3:34 PM Pt signed out to Dr. [place] by Dr. Pilar Plate and myself at shift change.   CRITICAL CARE Performed by: Myrtis Ser PA-C Total critical care time: 55 minutes Critical care time was exclusive of separately billable procedures and treating other patients. Critical care was necessary to treat or prevent imminent or life-threatening deterioration. Critical care was time spent personally by me on the following activities: development of treatment plan with patient and/or surrogate as well as nursing, discussions with consultants, evaluation of patient's response to treatment, examination of patient, obtaining history from patient or surrogate, ordering and performing treatments and interventions, ordering and review of laboratory studies, ordering and review of radiographic studies, pulse oximetry and re-evaluation of patient's condition.     MDM Rules/Calculators/A&P                          Admit.    Final Clinical Impression(s) / ED Diagnoses Final diagnoses:  Acute respiratory failure with hypoxia [facility])    Rx / DC Orders ED Discharge Orders    None       [facility], PA-C 04/03/21 1536    06/03/21, MD 04/04/21 [phone]

## 2021-04-03 NOTE — ED Notes (Signed)
MD at bedside. Pt is very restless and has been pulling EKG leads and bipap. Ativan IVP given. Will continue to monitor.

## 2021-04-03 NOTE — ED Notes (Signed)
Report given to ICU. RT is not ready at this time. Plan is to transfer pt after shift change. Will continue to monitor.

## 2021-04-03 NOTE — ED Triage Notes (Signed)
From [facility]. Here for AMS and SOB with O2 in the 70s on RA. Vomiting with possible aspiration. Dx with UTI yesterday. Pt is usually A&Ox3. Today pt is A&Ox1.

## 2021-04-03 NOTE — ED Notes (Signed)
Dr.Katz at bedside. Instructed RN to start at 59mcg/min for Nitro drip.

## 2021-04-03 NOTE — ED Notes (Signed)
Pt resting in bed @ this time w/ NAD noted. VSS. Cardiac monitoring in place w/ bp cycling and spo2 being monnitored. IV nitroglycerin and IV mag infusing. Nitro titrated down. BIPAP in place, o2 sat 97%. Deny needs or concerns @ this time. Bed low and locked. Side rails up 2.

## 2021-04-03 NOTE — Progress Notes (Incomplete)
eLink Physician-Brief Progress Note Patient Name: Christina Ortiz DOB: [birth year] MRN: [id number]   Date of Service  04/03/2021  HPI/Events of Note  89F with Afib on Xarelto, CKD, admitted with acute hypoxemic respiratory failure secondary to aspiration event +/- component of heart failure.   eICU Interventions       Intervention Category Evaluation Type: New Patient Evaluation  Janae Bridgeman 04/03/2021, 8:18 PM

## 2021-04-03 NOTE — ED Notes (Signed)
Pt taken upstairs w/ RT assistance on monitoring devices. NAD noted. VSS.

## 2021-04-03 NOTE — H&P (Signed)
NAME:  Suli Danek, MRN:  [id number], DOB:  01/28/1929, LOS: 0 ADMISSION DATE:  04/03/2021, CONSULTATION DATE:  04/03/2021 REFERRING MD:  Dr. Clarene Duke, CHIEF COMPLAINT: Hypertensive crisis with flash pulmonary edema    History of Present Illness:  Amica Bohanon is a 87 y.o. female with PMH significant for but not limited to PAF anticoagulated on half dose Eliquis, CKD, HTN, HLD, hypothyroidism, GERD, and mild dementia who presented from local ALF with AMS, SOB, and severe hypertension. Per daughter ALF reported AMS with episode of vomiting followed by hypoxia and later assess severe hypertension. Daughter additionally reports patient was recently diagnosed with a UTI at ALF.   Initially on arrival patient was seen tachypneic and hypertensive with B/P 203/137. Oxygen saturations were appropriate on 4 LNC. At that time she was placed in a Nitro drip for hypertensive urgency and plan was to admit to Hospitalist. Unfortunately patient later became more altered, hypoxic, and hypotensive. This prompted discontinuation of Nitor, application of BIPAP and consult to PCCM. Pertinent labs included creatine 1.16, alkaline phos 130, BNP 1105, HS troponin 38, and lactic acid 2.4,   Pertinent  Medical History  Significant for but not limited to; PAF anticoagulated on half dose Eliquis CKD HTN HLD Hypothyroidism GERD Mild dementia   Significant Hospital Events: Including procedures, antibiotic start and stop dates in addition to other pertinent events   . 4/8 Admitted with possible aspiration event vs flash pulmonary edema in the setting of hypertensive urgency   Interim History / Subjective:  As above   Objective   Blood pressure 109/80, pulse 89, temperature 98.7 F (37.1 C), temperature source Oral, resp. rate (!) 34, height 5\' 2"  (1.575 m), weight 45.4 kg, SpO2 100 %.    Vent Mode: BIPAP;PCV FiO2 (%):  [40 %] 40 % Set Rate:  [22 bmp] 22 bmp PEEP:  [5 cmH20] 5 cmH20   Intake/Output Summary  (Last 24 hours) at 04/03/2021 1721 Last data filed at 04/03/2021 1604 Gross per 24 hour  Intake 77.29 ml  Output --  Net 77.29 ml   Filed Weights   04/03/21 1334  Weight: 45.4 kg    Examination: General: Chronically ill appearing deconditioned elderly female on BIPAP, in NAD HEENT: Steen/AT, MM pink/moist, PERRL,  Neuro: Sedated on BIPAP with recent dose of Ativan, seen saponaceously moving extremities  CV: s1s2 regular rate and rhythm, no murmur, rubs, or gallops,  PULM:  Bilateral expiratory wheezing and crackles, tolerating BIPAP,  GI: soft, bowel sounds active in all 4 quadrants, non-tender, non-distended Extremities: warm/dry, no edema  Skin: no rashes or lesions  Labs/imaging that I have   4/8 CXR > Persistent cardiomegaly. Left base atelectasis.  4/8 Head CT > Atrophy with stable supratentorial small vessel disease. 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  Resolved Hospital Problem list     Assessment & Plan:  Acute Hypoxic Respiratory Failure  -Likely multifactorial, concern for flash pulmonary edema in the setting of hypertensive urgency on admit. There is also a possibility of aspiration event PTA with vomiting reported at ALF  P: Continue BIPAP support with SPO2 goal > 90 Aspiration precautions Head of bed elevated 30 degrees Follow intermittent chest x-ray and ABG  Ensure adequate pulmonary hygiene  Follow cultures  VAP bundle in place  Empiric antibiotics as above   Severe sepsis  -Patient mets criteria for severe sepsis on arrival given tachypnea, acute AMS, lactic acid elevated at 2.4, and acute concern for bacterial infection. There is a possibility  of underlying UTI P: Admit to ICU Supplemental oxygen as above  Pan cultures  Empiric Ceftriaxone  Obtain MRSA sawb Trend lactic acid Obtain Procalcitonin Monitor urine output  Elevated BNP  -Concern for flash pulmonary edema and possible CHF component P: Diurese as able  Obtain  ECHO Strict I&O Daily weight   Paroxysmal A-fib anticoagulated on half dose Xarelto  P: Continuous telemetry  Hold home Xarelto until able to take orals, may need to consider heparin drip  Hold home Beta blocker until BP stabilizes   Hypothyroidism P: Continue home synthroid when able to take orals   Hx of HTN Hx of HLD -Home medication rec pending  P: Hold home meds until able to take orals safely  Would opt for IV Cleviprix if hypertension returns  Monitor volume status closely in the ICU setting  Diurese as tolerated   CKD stage 3b -Baseline GFR low to mid 40 P: Follow renal function / urine output Trend Bmet Avoid nephrotoxins Ensure adequate renal perfusion   At risk malnutrition P: Start oral diet as soon as safe to do so  Supplement protein when able   Best practice   Diet:  NPO Pain/Anxiety/Delirium protocol (if indicated): No VAP protocol (if indicated): Not indicated DVT prophylaxis: Subcutaneous Heparin GI prophylaxis: PPI Glucose control:  SSI No Central venous access:  N/A Arterial line:  N/A Foley:  N/A Mobility:  bed rest  PT consulted: N/A Last date of multidisciplinary goals of care discussion Daughter updated at bedside and states patient has an existing DNR but she would like patient intubated if condition is reversible. Patient and family would not want prolonged extreme measures  Code Status:  limited Disposition: ICU  Labs   CBC: Recent Labs  Lab 04/03/21 1352 04/03/21 1447  WBC 11.0*  --   NEUTROABS 8.8*  --   HGB 15.0 15.3*  HCT 47.3* 45.0  MCV 102.6*  --   PLT 252  --     Basic Metabolic Panel: Recent Labs  Lab 04/03/21 1352 04/03/21 1447  NA 141 141  K 4.7 5.1  CL 105  --   CO2 25  --   GLUCOSE 93  --   BUN 29*  --   CREATININE 1.16*  --   CALCIUM 9.4  --    GFR: Estimated Creatinine Clearance: 23.6 mL/min (A) (by C-G formula based on SCr of 1.16 mg/dL (H)). Recent Labs  Lab 04/03/21 1352 04/03/21 1354   WBC 11.0*  --   LATICACIDVEN  --  2.4*    Liver Function Tests: Recent Labs  Lab 04/03/21 1352  AST 29  ALT 10  ALKPHOS 130*  BILITOT 1.4*  PROT 7.4  ALBUMIN 3.3*   No results for input(s): LIPASE, AMYLASE in the last 168 hours. No results for input(s): AMMONIA in the last 168 hours.  ABG    Component Value Date/Time   HCO3 24.8 04/03/2021 1447   TCO2 26 04/03/2021 1447   ACIDBASEDEF 1.0 04/03/2021 1447   O2SAT 87.0 04/03/2021 1447     Coagulation Profile: No results for input(s): INR, PROTIME in the last 168 hours.  Cardiac Enzymes: No results for input(s): CKTOTAL, CKMB, CKMBINDEX, TROPONINI in the last 168 hours.  HbA1C: Hemoglobin A1C  Date/Time Value Ref Range Status  09/27/2019 10:41 AM 5.5 4.0 - 5.6 % Final   Hgb A1c MFr Bld  Date/Time Value Ref Range Status  07/05/2019 11:21 AM 5.5 4.8 - 5.6 % Final    Comment:  Prediabetes: 5.7 - 6.4          Diabetes: >6.4          Glycemic control for adults with diabetes: <7.0   01/22/2017 10:27 AM 5.1 4.8 - 5.6 % Final    Comment:    (NOTE)         Pre-diabetes: 5.7 - 6.4         Diabetes: >6.4         Glycemic control for adults with diabetes: <7.0     CBG: No results for input(s): GLUCAP in the last 168 hours.  Review of Systems:   Please see the history of present illness. All other systems reviewed and are negative   Past Medical History:  She,  has a past medical history of Acute bronchitis (10/2017), Arthritis, CKD (chronic kidney disease), Coronary artery disease, Depression, Gallstones, Gastritis, GERD (gastroesophageal reflux disease), Hiatal hernia, Hip fracture (HCC), History of cholecystectomy, History of echocardiogram, Hyperlipidemia, Hypertension, Hypokalemia, Hypothyroidism, Mobitz type 1 second degree atrioventricular block, PAF (paroxysmal atrial fibrillation) (HCC), and Thyroid disease.   Surgical History:   Past Surgical History:  Procedure Laterality Date  . ANGIOPLASTY   2006   with sent placement  . BACK SURGERY     Dr Shon Baton  . BILIARY DILATION  10/18/2019   Procedure: BILIARY DILATION;  Surgeon: Rachael Fee, MD;  Location: [facility];  Service: Endoscopy;;  . BILIARY DILATION  12/17/2019   Procedure: BILIARY DILATION;  Surgeon: [name], MD;  Location: [facility] ENDOSCOPY;  Service: Endoscopy;;  . BILIARY DILATION  02/07/2020   Procedure: BILIARY DILATION;  Surgeon: Lemar Lofty., MD;  Location: [facility] ENDOSCOPY;  Service: Gastroenterology;;  . BILIARY DILATION  09/10/2020   Procedure: BILIARY DILATION;  Surgeon: Lemar Lofty., MD;  Location: [facility] ENDOSCOPY;  Service: Gastroenterology;;  . BILIARY STENT PLACEMENT N/A 10/18/2019   Procedure: BILIARY STENT PLACEMENT;  Surgeon: Rachael Fee, MD;  Location: [facility] ENDOSCOPY;  Service: Endoscopy;  Laterality: N/A;  . BILIARY STENT PLACEMENT N/A 12/17/2019   Procedure: BILIARY STENT PLACEMENT;  Surgeon: [name], MD;  Location: [facility] ENDOSCOPY;  Service: Endoscopy;  Laterality: N/A;  . BILIARY STENT PLACEMENT  02/07/2020   Procedure: BILIARY STENT PLACEMENT;  Surgeon: Meridee Score Netty Starring., MD;  Location: [facility] ENDOSCOPY;  Service: Gastroenterology;;  . BLEPHAROPLASTY    . BREAST LUMPECTOMY Left   . CARDIAC CATHETERIZATION  2006  . CATARACT EXTRACTION Bilateral   . CHOLECYSTECTOMY    . DILATION AND CURETTAGE OF UTERUS    . ENDOSCOPIC RETROGRADE CHOLANGIOPANCREATOGRAPHY (ERCP) WITH PROPOFOL N/A 12/17/2019   Procedure: ENDOSCOPIC RETROGRADE CHOLANGIOPANCREATOGRAPHY (ERCP) WITH PROPOFOL;  Surgeon: [name], MD;  Location: [facility] ENDOSCOPY;  Service: Endoscopy;  Laterality: N/A;  . ENDOSCOPIC RETROGRADE CHOLANGIOPANCREATOGRAPHY (ERCP) WITH PROPOFOL N/A 02/07/2020   Procedure: ENDOSCOPIC RETROGRADE CHOLANGIOPANCREATOGRAPHY (ERCP) WITH PROPOFOL;  Surgeon: Meridee Score Netty Starring., MD;  Location: [facility] ENDOSCOPY;  Service: Gastroenterology;  Laterality: N/A;  . ENDOSCOPIC RETROGRADE  CHOLANGIOPANCREATOGRAPHY (ERCP) WITH PROPOFOL N/A 09/10/2020   Procedure: ENDOSCOPIC RETROGRADE CHOLANGIOPANCREATOGRAPHY (ERCP) WITH PROPOFOL;  Surgeon: Meridee Score Netty Starring., MD;  Location: [facility] ENDOSCOPY;  Service: Gastroenterology;  Laterality: N/A;  . ERCP N/A 10/18/2019   Procedure: ENDOSCOPIC RETROGRADE CHOLANGIOPANCREATOGRAPHY (ERCP);  Surgeon: Rachael Fee, MD;  Location: [facility]OS[facility];  Service: Endoscopy;  Laterality: N/A;  . ESOPHAGOGASTRODUODENOSCOPY (EGD) WITH PROPOFOL N/A 12/17/2019   Procedure: ESOPHAGOGASTRODUODENOSCOPY (EGD) WITH PROPOFOL;  Surgeon: [name], MD;  Location: [facility] ENDOS[facility];  Service: Endoscopy;  Laterality: N/A;  .  INTRAMEDULLARY (IM) NAIL INTERTROCHANTERIC Right 01/25/2021   Procedure: INTRAMEDULLARY (IM) NAIL INTERTROCHANTRIC;  Surgeon: Durene Romanslin, Matthew, MD;  [facility];  Service: Orthopedics;  Laterality: Right;  . KYPHOPLASTY N/A 01/03/2020   Procedure: KYPHOPLASTY LUMBAR TWO;  Surgeon: Venita LickBrooks, Dahari, MD;  [facility];  Service: Orthopedics;  Laterality: N/A;  . REMOVAL OF STONES  10/18/2019   Procedure: REMOVAL OF STONES;  Surgeon: Rachael FeeJacobs, Daniel P, MD;  Location: [facility] ENDOSCOPY;  Service: Endoscopy;;  . REMOVAL OF STONES  12/17/2019   Procedure: REMOVAL OF STONES;  Surgeon: Meryl DareStark, Malcolm T, MD;  Location: [facility] ENDOS[facility];  Service: Endoscopy;;  . REMOVAL OF STONES  02/07/2020   Procedure: REMOVAL OF STONES;  Surgeon: Lemar LoftyMansouraty, Gabriel Jr., MD;  [facility];  Service: Gastroenterology;;  . REMOVAL OF STONES  09/10/2020   Procedure: REMOVAL OF STONES;  Surgeon: Lemar LoftyMansouraty, Gabriel Jr., MD;  [facility];  Service: Gastroenterology;;  . Gaspar BiddingSAVORY DILATION N/A 12/17/2019   Procedure: Gaspar BiddingSAVORY DILATION;  Surgeon: Meryl DareStark, Malcolm T, MD;  Location: [facility];  Service: Endoscopy;  Laterality: N/A;  . SPHINCTEROTOMY  10/18/2019   Procedure: SPHINCTEROTOMY;  Surgeon: Rachael FeeJacobs, Daniel P, MD;  [facility];  Service: Endoscopy;;  .  Dennison MascotSPHINCTEROTOMY  12/17/2019   Procedure: Dennison MascotSPHINCTEROTOMY;  Surgeon: Meryl DareStark, Malcolm T, MD;  [facility];  Service: Endoscopy;;  . Burman FreestoneSPYGLASS CHOLANGIOSCOPY N/A 02/07/2020   Procedure: BJYNWGNFSPYGLASS CHOLANGIOSCOPY;  Surgeon: Lemar LoftyMansouraty, Gabriel Jr., MD;  [facility];  Service: Gastroenterology;  Laterality: N/A;  Burman Freestone. SPYGLASS LITHOTRIPSY N/A 02/07/2020   Procedure: AOZHYQMVSPYGLASS LITHOTRIPSY;  Surgeon: Lemar LoftyMansouraty, Gabriel Jr., MD;  [facility];  Service: Gastroenterology;  Laterality: N/A;  . STENT REMOVAL  12/17/2019   Procedure: STENT REMOVAL;  Surgeon: Meryl DareStark, Malcolm T, MD;  [facility];  Service: Endoscopy;;  . Francine GravenSTENT REMOVAL  02/07/2020   Procedure: STENT REMOVAL;  Surgeon: Lemar LoftyMansouraty, Gabriel Jr., MD;  [facility];  Service: Gastroenterology;;  . Francine GravenSTENT REMOVAL  09/10/2020   Procedure: STENT REMOVAL;  Surgeon: Lemar LoftyMansouraty, Gabriel Jr., MD;  [facility];  Service: Gastroenterology;;     Social History:   reports that she quit smoking about 6 years ago. Her smoking use included cigarettes. She has a 33.50 pack-year smoking history. She has never used smokeless tobacco. She reports current alcohol use of about 4.0 standard drinks of alcohol per week. She reports that she does not use drugs.   Family History:  Her family history includes Esophageal cancer in her paternal uncle; Heart attack in her mother; Heart disease in her mother; Heart failure in her brother; Lung cancer in her father; Lymphoma in her brother. There is no history of Colon cancer, Colon polyps, Rectal cancer, or Stomach cancer.   Allergies No Known Allergies   Home Medications  Prior to Admission medications   Medication Sig Start Date End Date Taking? Authorizing Provider  Mouthwashes (LISTERINE ANTISEPTIC) LIQD 15 mLs by Mouth Rinse route in the morning.   Yes [provider]  acetaminophen (TYLENOL) 325 MG tablet Take 650 mg by mouth every 6 (six) hours as needed for  moderate pain (for pain.).     [provider]  buPROPion (WELLBUTRIN XL) 150 MG 24 hr tablet Take 1 tablet (150 mg total) by mouth daily. 07/11/20   Mayer MaskerAbonza, Maritza, PA-C  CALCIUM-MAGNESIUM-ZINC PO Take 1 tablet by mouth daily.     [provider]  ferrous sulfate 325 (65 FE) MG tablet Take 1 tablet (325 mg total) by mouth daily with  breakfast for 14 days. 01/30/21 02/13/21  Albertine Grates, MD  folic acid (FOLVITE) 1 MG tablet Take 1 tablet (1 mg total) by mouth daily. 01/07/21   Mayer Masker, PA-C  HYDROcodone-acetaminophen (NORCO) 5-325 MG tablet Take 1-2 tablets by mouth every 6 (six) hours as needed for moderate pain or severe pain. 01/27/21   Lanney Gins, PA-C  levothyroxine (SYNTHROID) 75 MCG tablet Take 1 tablet (75 mcg total) by mouth daily before breakfast. **PATIENT NEEDS LAB FOR FURTHER REFILLS** 12/01/20   Abonza, Maritza, PA-C  lidocaine (LIDODERM) 5 % Place 1 patch onto the skin daily. Remove & Discard patch within 12 hours or as directed by MD    [provider]  metoprolol tartrate (LOPRESSOR) 25 MG tablet Take 0.5 tablets (12.5 mg total) by mouth 2 (two) times daily. **NEEDS APT FOR FURTHER MED REFILLS** 12/01/20   Abonza, Maritza, PA-C  omeprazole (PRILOSEC) 20 MG capsule TAKE 1 CAPSULE BY MOUTH DAILY AS NEEDED FOR HEARTBURN. Patient taking differently: Take 20 mg by mouth daily as needed (heartburn). 09/04/19   Danford, Orpha Bur D, NP  PARoxetine (PAXIL) 20 MG tablet Take 1 tab by mouth daily for 2 weeks then increase to 2 tabs by mouth daily Patient taking differently: Take 40 mg by mouth daily. 01/01/21   Mayer Masker, PA-C  polyethylene glycol (MIRALAX / GLYCOLAX) 17 g packet Take 17 g by mouth daily. 01/30/21   Albertine Grates, MD  potassium chloride SA (KLOR-CON) 20 MEQ tablet Take 1 tablet (20 mEq total) by mouth daily. Please keep upcoming schedule for future refill Patient taking differently: Take 20 mEq by mouth daily. 12/17/20   Jake Bathe, MD  Propylene Glycol  0.6 % SOLN Place 1 drop into both eyes daily.    [provider]  senna-docusate (SENOKOT-S) 8.6-50 MG tablet Take 1 tablet by mouth at bedtime. 01/30/21   Albertine Grates, MD  simvastatin (ZOCOR) 5 MG tablet TAKE 1 TABLET BY MOUTH EVERY EVENING. Patient taking differently: Take 5 mg by mouth every evening. 08/07/20   Mayer Masker, PA-C  vitamin B-12 100 MCG tablet Take 1 tablet (100 mcg total) by mouth daily. 01/31/21   Albertine Grates, MD  VITAMIN D PO Take 1 tablet by mouth at bedtime.    [provider]  XARELTO 15 MG TABS tablet TAKE 1 TABLET BY MOUTH DAILY WITH SUPPER. Patient taking differently: Take 15 mg by mouth daily with supper. 12/22/20   Jake Bathe, MD     Critical care time:   CRITICAL CARE Performed by: Delfin Gant  Total critical care time: 50 minutes  Critical care time was exclusive of separately billable procedures and treating other patients.  Critical care was necessary to treat or prevent imminent or life-threatening deterioration.  Critical care was time spent personally by me on the following activities: development of treatment plan with patient and/or surrogate as well as nursing, discussions with consultants, evaluation of patient's response to treatment, examination of patient, obtaining history from patient or surrogate, ordering and performing treatments and interventions, ordering and review of laboratory studies, ordering and review of radiographic studies, pulse oximetry and re-evaluation of patient's condition.  Delfin Gant, NP-C  Pulmonary & Critical Care Personal contact information can be found on Amion  If no response please page: Adult pulmonary and critical care medicine pager on Amion unitl 7pm After 7pm please call [phone] 04/03/2021, 6:06 PM

## 2021-04-03 NOTE — ED Notes (Signed)
RT at bedside. Plan is to switch pt to bipap.

## 2021-04-03 NOTE — ED Provider Notes (Signed)
  Provider Note MRN:  [id number]  Arrival date & time: 04/03/21    ED Course and Medical Decision Making  Assumed care from Dr. Myrtis Ser at shift change.  Respiratory distress on BiPAP, nitro drip, was hypertensive, suspect CHF versus hypertensive crisis.  Seems to be improving, will need stepdown admission.  4:30 PM update: On my evaluation patient is exhibiting delirium, thrashing in bed, not allowing a good seal on the BiPAP.  Family present at bedside.  We discussed the options at this time, how hypertensive bleed is still a possibility.  Decision made to provide IV Ativan to attempt to calm her down, facilitate better BiPAP seal, facilitate imaging.  This has gone smoothly thus far, doing better, is calm, CT without any obvious bleeding on my evaluation.  Patient with decreased blood pressure in the setting of nitro drip, BiPAP, Ativan.  Nitro drip has been discontinued.  Patient remains somnolent but protecting airway.  Given the entire picture, will consult intensivist for admission.  .Critical Care Performed by: Sabas Sous, MD Authorized by: Sabas Sous, MD   Critical care provider statement:    Critical care time (minutes):  45   Critical care was necessary to treat or prevent imminent or life-threatening deterioration of the following conditions:  Respiratory failure   Critical care was time spent personally by me on the following activities:  Discussions with consultants, evaluation of patient's response to treatment, examination of patient, ordering and performing treatments and interventions, ordering and review of laboratory studies, ordering and review of radiographic studies, pulse oximetry, re-evaluation of patient's condition, obtaining history from patient or surrogate and review of old charts   I assumed direction of critical care for this patient from another provider in my specialty: yes      Final Clinical Impressions(s) / ED Diagnoses     ICD-10-CM   1. Acute  respiratory failure with hypoxia [facility])  J96.01     ED Discharge Orders    None      Discharge Instructions   None     Elmer Sow. Pilar Plate, MD [facility] Emergency Medicine [facility] [email or web address]    Sabas Sous, MD 04/03/21 [phone]

## 2021-04-04 ENCOUNTER — Other Ambulatory Visit (HOSPITAL_COMMUNITY): Payer: Medicare PPO

## 2021-04-04 ENCOUNTER — Inpatient Hospital Stay (HOSPITAL_COMMUNITY): Payer: Medicare PPO

## 2021-04-04 DIAGNOSIS — J9601 Acute respiratory failure with hypoxia: Secondary | ICD-10-CM | POA: Diagnosis not present

## 2021-04-04 LAB — POCT I-STAT 7, (LYTES, BLD GAS, ICA,H+H)
Acid-base deficit: 5 mmol/L — ABNORMAL HIGH (ref 0.0–2.0)
Bicarbonate: 20.5 mmol/L (ref 20.0–28.0)
Calcium, Ion: 1.23 mmol/L (ref 1.15–1.40)
HCT: 43 % (ref 36.0–46.0)
Hemoglobin: 14.6 g/dL (ref 12.0–15.0)
O2 Saturation: 96 %
Potassium: 4 mmol/L (ref 3.5–5.1)
Sodium: 144 mmol/L (ref 135–145)
TCO2: 22 mmol/L (ref 22–32)
pCO2 arterial: 37.4 mmHg (ref 32.0–48.0)
pH, Arterial: 7.347 — ABNORMAL LOW (ref 7.350–7.450)
pO2, Arterial: 83 mmHg (ref 83.0–108.0)

## 2021-04-04 LAB — CBC
HCT: 44.8 % (ref 36.0–46.0)
Hemoglobin: 14.7 g/dL (ref 12.0–15.0)
MCH: 33.4 pg (ref 26.0–34.0)
MCHC: 32.8 g/dL (ref 30.0–36.0)
MCV: 101.8 fL — ABNORMAL HIGH (ref 80.0–100.0)
Platelets: 211 10*3/uL (ref 150–400)
RBC: 4.4 MIL/uL (ref 3.87–5.11)
RDW: 13.4 % (ref 11.5–15.5)
WBC: 16.2 10*3/uL — ABNORMAL HIGH (ref 4.0–10.5)
nRBC: 0.1 % (ref 0.0–0.2)

## 2021-04-04 LAB — PROCALCITONIN
Procalcitonin: 1.09 ng/mL
Procalcitonin: 1.24 ng/mL

## 2021-04-04 LAB — LACTIC ACID, PLASMA: Lactic Acid, Venous: 5.4 mmol/L (ref 0.5–1.9)

## 2021-04-04 LAB — GLUCOSE, CAPILLARY
Glucose-Capillary: 71 mg/dL (ref 70–99)
Glucose-Capillary: 66 mg/dL — ABNORMAL LOW (ref 70–99)
Glucose-Capillary: 98 mg/dL (ref 70–99)
Glucose-Capillary: 96 mg/dL (ref 70–99)
Glucose-Capillary: 130 mg/dL — ABNORMAL HIGH (ref 70–99)
Glucose-Capillary: 62 mg/dL — ABNORMAL LOW (ref 70–99)
Glucose-Capillary: 101 mg/dL — ABNORMAL HIGH (ref 70–99)
Glucose-Capillary: 83 mg/dL (ref 70–99)

## 2021-04-04 LAB — BASIC METABOLIC PANEL
Anion gap: 14 (ref 5–15)
BUN: 37 mg/dL — ABNORMAL HIGH (ref 8–23)
CO2: 21 mmol/L — ABNORMAL LOW (ref 22–32)
Calcium: 9 mg/dL (ref 8.9–10.3)
Chloride: 105 mmol/L (ref 98–111)
Creatinine, Ser: 1.39 mg/dL — ABNORMAL HIGH (ref 0.44–1.00)
GFR, Estimated: 36 mL/min — ABNORMAL LOW (ref >60)
Glucose, Bld: 73 mg/dL (ref 70–99)
Potassium: 5 mmol/L (ref 3.5–5.1)
Sodium: 140 mmol/L (ref 135–145)

## 2021-04-04 LAB — PHOSPHORUS: Phosphorus: 4.8 mg/dL — ABNORMAL HIGH (ref 2.5–4.6)

## 2021-04-04 LAB — MAGNESIUM: Magnesium: 2.4 mg/dL (ref 1.7–2.4)

## 2021-04-04 MED ORDER — FENTANYL CITRATE (PF) 100 MCG/2ML IJ SOLN
INTRAMUSCULAR | Status: AC
  Filled 2021-04-04: qty 2

## 2021-04-04 MED ORDER — DEXTROSE 50 % IV SOLN
INTRAVENOUS | Status: AC
  Filled 2021-04-04: qty 50

## 2021-04-04 MED ORDER — POLYETHYLENE GLYCOL 3350 17 G PO PACK
17.0000 g | PACK | Freq: Every day | ORAL | Status: DC
  Filled 2021-04-04: qty 1

## 2021-04-04 MED ORDER — CLEVIDIPINE BUTYRATE 0.5 MG/ML IV EMUL
0.0000 mg/h | INTRAVENOUS | Status: DC
Start: 2021-04-04 — End: 2021-04-05
  Filled 2021-04-04: qty 50

## 2021-04-04 MED ORDER — DEXTROSE 50 % IV SOLN
25.0000 mL | Freq: Once | INTRAVENOUS | Status: AC
  Administered 2021-04-04: 25 mL via INTRAVENOUS

## 2021-04-04 MED ORDER — LACTATED RINGERS IV BOLUS
500.0000 mL | Freq: Once | INTRAVENOUS | Status: AC
  Administered 2021-04-04: 500 mL via INTRAVENOUS

## 2021-04-04 MED ORDER — ETOMIDATE 2 MG/ML IV SOLN
INTRAVENOUS | Status: AC
  Filled 2021-04-04: qty 20

## 2021-04-04 MED ORDER — HALOPERIDOL LACTATE 5 MG/ML IJ SOLN
5.0000 mg | Freq: Four times a day (QID) | INTRAMUSCULAR | Status: DC | PRN
  Administered 2021-04-04 – 2021-04-09 (×2): 5 mg via INTRAVENOUS
  Filled 2021-04-04 (×2): qty 1

## 2021-04-04 MED ORDER — CHLORHEXIDINE GLUCONATE 0.12 % MT SOLN
15.0000 mL | Freq: Two times a day (BID) | OROMUCOSAL | Status: DC
  Administered 2021-04-04 – 2021-04-11 (×11): 15 mL via OROMUCOSAL
  Filled 2021-04-04 (×10): qty 15

## 2021-04-04 MED ORDER — DEXMEDETOMIDINE HCL IN NACL 400 MCG/100ML IV SOLN
0.0000 ug/kg/h | INTRAVENOUS | Status: DC
  Administered 2021-04-04: 0.4 ug/kg/h via INTRAVENOUS
  Filled 2021-04-04: qty 100

## 2021-04-04 MED ORDER — ORAL CARE MOUTH RINSE
15.0000 mL | Freq: Two times a day (BID) | OROMUCOSAL | Status: DC
  Administered 2021-04-04 – 2021-04-13 (×13): 15 mL via OROMUCOSAL

## 2021-04-04 MED ORDER — ROCURONIUM BROMIDE 10 MG/ML (PF) SYRINGE
PREFILLED_SYRINGE | INTRAVENOUS | Status: AC
  Filled 2021-04-04: qty 10

## 2021-04-04 MED ORDER — DOCUSATE SODIUM 50 MG/5ML PO LIQD
100.0000 mg | Freq: Two times a day (BID) | ORAL | Status: DC
  Filled 2021-04-04 (×4): qty 10

## 2021-04-04 MED ORDER — MIDAZOLAM HCL 2 MG/2ML IJ SOLN
INTRAMUSCULAR | Status: AC
  Filled 2021-04-04: qty 2

## 2021-04-04 MED ORDER — DEXTROSE 50 % IV SOLN
INTRAVENOUS | Status: AC
  Administered 2021-04-04: 25 mL
  Filled 2021-04-04: qty 50

## 2021-04-04 NOTE — Progress Notes (Signed)
PCCM Interval Note  Updated daughter and son-in-law via telephone regarding patient's poor mental status and worsening hypoxemia and blood pressure. We discussed aggressive care including mechanical ventilation and vasopressor support if needed. We discussed patient's baseline with dementia with hallucinations and how quality of life is important to them. After our conversation weighing the risks and benefits of the procedures and goals of care, the decision was made to transition patient to DNR/DNI.  Mechele Collin, M.D. [facility] Pulmonary/Critical Care Medicine 04/04/2021 1:55 PM

## 2021-04-04 NOTE — Procedures (Signed)
Cannot do echo at this time as patient will not lie still.   Limited echo may need to be considered if patient cannot cooperate.

## 2021-04-04 NOTE — Progress Notes (Addendum)
NAME:  Christina Ortiz, MRN:  [id number], DOB:  1929/08/11, LOS: 1 ADMISSION DATE:  04/03/2021, CONSULTATION DATE:  04/03/2021 REFERRING MD:  Dr. Clarene Duke, CHIEF COMPLAINT: Hypertensive crisis with flash pulmonary edema    History of Present Illness:  Christina Ortiz is a 87 y.o. female with PMH significant for but not limited to PAF anticoagulated on half dose Eliquis, CKD, HTN, HLD, hypothyroidism, GERD, and mild dementia who presented from local ALF with AMS, SOB, and severe hypertension. Per daughter ALF reported AMS with episode of vomiting followed by hypoxia and later assess severe hypertension. Daughter additionally reports patient was recently diagnosed with a UTI at ALF.   Initially on arrival patient was seen tachypneic and hypertensive with B/P 203/137. Oxygen saturations were appropriate on 4 LNC. At that time she was placed in a Nitro drip for hypertensive urgency and plan was to admit to Hospitalist. Unfortunately patient later became more altered, hypoxic, and hypotensive. This prompted discontinuation of Nitor, application of BIPAP and consult to PCCM. Pertinent labs included creatine 1.16, alkaline phos 130, BNP 1105, HS troponin 38, and lactic acid 2.4,   Pertinent  Medical History  Significant for but not limited to; PAF anticoagulated on half dose Eliquis CKD HTN HLD Hypothyroidism GERD Mild dementia   Significant Hospital Events: Including procedures, antibiotic start and stop dates in addition to other pertinent events   . 4/8 Admitted with possible aspiration event vs flash pulmonary edema in the setting of hypertensive urgency  . 4/9 Started on precedex for severe agitation  Interim History / Subjective:  Agitated in bed. On FIO2 70%  Objective   Blood pressure (!) 162/116, pulse 96, temperature 98 F (36.7 C), temperature source Axillary, resp. rate (!) 27, height 5\' 2"  (1.575 m), weight 55.1 kg, SpO2 99 %.    Vent Mode: BIPAP;PCV FiO2 (%):  [40 %-100 %] 70  % Set Rate:  [22 bmp] 22 bmp PEEP:  [5 cmH20-8 cmH20] 8 cmH20   Intake/Output Summary (Last 24 hours) at 04/04/2021 1020 Last data filed at 04/04/2021 0800 Gross per 24 hour  Intake 1168.18 ml  Output 100 ml  Net 1068.18 ml   Filed Weights   04/03/21 1334 04/04/21 0429  Weight: 45.4 kg 55.1 kg   Physical Exam: General: Chronically ill-appearing, no acute distress HENT: [place], AT, OP clear, MMM Eyes: EOMI, no scleral icterus Respiratory: Rhonchi bilaterally.  No wheezing Cardiovascular: RRR, -M/R/G, no JVD GI: BS+, soft, nontender Extremities: Pedal edema, tenderness in right forefoot with dusky toes, good pulses and warm to touch Neuro: Awake, delirious, does not follow commands, moves extremities x 4 spontaneously GU: Foley in place  Labs/imaging that I have   4/8 CXR > Persistent cardiomegaly. Left base atelectasis.  4/8 Head CT > Atrophy with stable supratentorial small vessel disease. 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 6/8  Resolved Hospital Problem list     Assessment & Plan:  Acute hypoxic respiratory failure secondary to aspiration. Possible component of heart failure with elevated BNP  Remains high risk for intubation with waxing and waning mental status P: Continue HF and PRN BiPAP for SpO2 >90% DC IVF for hypertension Aspiration precautions Follow intermittent chest x-ray and ABG  Ensure adequate pulmonary hygiene  Empiric antibiotics as below  Acute encephalopathy  Multifactorial in setting of infection, hypertension, delirium P:  Start Precedex  Hypertensive urgency P: PRN hydralazine for SBP >170 If refractory, start cleviprex gtt  Severe sepsis secondary to aspiration, presumed UTI -Reportedly positive UA  at SNF. No evidence of infection on admission. Concern for aspiration with reported emesis P: Continue Ceftriaxone  F/u cultures  Trend lactic acid  Elevated BNP  -Concern for flash pulmonary edema and possible CHF  component P: Diurese as able  F/u ECHO Strict I&O Daily weight   Paroxysmal A-fib anticoagulated on half dose Xarelto  P: Continuous telemetry  Hold home Xarelto until able to take orals, may need to consider heparin drip  Hold home Beta blocker until BP stabilizes   Hypothyroidism P: Continue home synthroid when able to take orals   Hx of HTN Hx of HLD -Home medication rec pending  P: Hold home meds until able to take orals safely  Would opt for IV Cleviprix if hypertension returns  Monitor volume status closely in the ICU setting  Diurese as tolerated   Acute on CKD stage 3b -Baseline GFR low to mid 40 P: Follow Cr/UOP Trend BMET Avoid nephrotoxins Ensure adequate renal perfusion   At risk malnutrition P: Start oral diet as soon as safe to do so  Supplement protein when able   Best practice   Diet:  NPO Pain/Anxiety/Delirium protocol (if indicated): No VAP protocol (if indicated): Not indicated DVT prophylaxis: Subcutaneous Heparin GI prophylaxis: PPI Glucose control:  SSI No Central venous access:  N/A Arterial line:  N/A Foley:  N/A Mobility:  bed rest  PT consulted: N/A Last date of multidisciplinary goals of care discussion Daughter updated at bedside and states patient has an existing DNR but she would like patient intubated if condition is reversible. Patient and family would not want prolonged extreme measures  Code Status:  limited Disposition: ICU  Labs   CBC: Recent Labs  Lab 04/03/21 1352 04/03/21 1447 04/03/21 2014 04/03/21 2234 04/04/21 0006 04/04/21 0812  WBC 11.0*  --  18.3*  --  16.2*  --   NEUTROABS 8.8*  --   --   --   --   --   HGB 15.0 15.3* 15.8* 13.9 14.7 14.6  HCT 47.3* 45.0 48.0* 41.0 44.8 43.0  MCV 102.6*  --  100.4*  --  101.8*  --   PLT 252  --  230  --  211  --     Basic Metabolic Panel: Recent Labs  Lab 04/03/21 1352 04/03/21 1447 04/03/21 2014 04/03/21 2234 04/04/21 0006 04/04/21 0812  NA 141 141  --   142 140 144  K 4.7 5.1  --  3.9 5.0 4.0  CL 105  --   --   --  105  --   CO2 25  --   --   --  21*  --   GLUCOSE 93  --   --   --  73  --   BUN 29*  --   --   --  37*  --   CREATININE 1.16*  --  1.51*  --  1.39*  --   CALCIUM 9.4  --   --   --  9.0  --   MG  --   --   --   --  2.4  --   PHOS  --   --   --   --  4.8*  --    GFR: Estimated Creatinine Clearance: 21.7 mL/min (A) (by C-G formula based on SCr of 1.39 mg/dL (H)). Recent Labs  Lab 04/03/21 1352 04/03/21 1354 04/03/21 1603 04/03/21 2014 04/03/21 2148 04/03/21 2320 04/04/21 0006  PROCALCITON  --   --   --   --   --  1.24 1.09  WBC 11.0*  --   --  18.3*  --   --  16.2*  LATICACIDVEN  --  2.4* 3.5*  --  3.1*  --   --     Liver Function Tests: Recent Labs  Lab 04/03/21 1352  AST 29  ALT 10  ALKPHOS 130*  BILITOT 1.4*  PROT 7.4  ALBUMIN 3.3*   No results for input(s): LIPASE, AMYLASE in the last 168 hours. No results for input(s): AMMONIA in the last 168 hours.  ABG    Component Value Date/Time   PHART 7.347 (L) 04/04/2021 0812   PCO2ART 37.4 04/04/2021 0812   PO2ART 83 04/04/2021 0812   HCO3 20.5 04/04/2021 0812   TCO2 22 04/04/2021 0812   ACIDBASEDEF 5.0 (H) 04/04/2021 0812   O2SAT 96.0 04/04/2021 0812      Critical care time: 35 min   The patient is critically ill with multiple organ systems failure and requires high complexity decision making for assessment and support, frequent evaluation and titration of therapies, application of advanced monitoring technologies and extensive interpretation of multiple databases.    Mechele Collin, M.D. [facility] Pulmonary/Critical Care Medicine 04/04/2021 10:21 AM   Please see Amion for pager number to reach on-call Pulmonary and Critical Care Team.

## 2021-04-04 NOTE — Progress Notes (Signed)
RT note- ABG performed post removal of Bipap, result is on 10 L/min [place].

## 2021-04-05 ENCOUNTER — Inpatient Hospital Stay (HOSPITAL_COMMUNITY): Payer: Medicare PPO

## 2021-04-05 DIAGNOSIS — J9601 Acute respiratory failure with hypoxia: Secondary | ICD-10-CM | POA: Diagnosis not present

## 2021-04-05 DIAGNOSIS — I4891 Unspecified atrial fibrillation: Secondary | ICD-10-CM

## 2021-04-05 DIAGNOSIS — G9340 Encephalopathy, unspecified: Secondary | ICD-10-CM | POA: Diagnosis not present

## 2021-04-05 DIAGNOSIS — I501 Left ventricular failure: Secondary | ICD-10-CM | POA: Diagnosis not present

## 2021-04-05 LAB — BASIC METABOLIC PANEL
Anion gap: 9 (ref 5–15)
BUN: 48 mg/dL — ABNORMAL HIGH (ref 8–23)
CO2: 24 mmol/L (ref 22–32)
Calcium: 8.5 mg/dL — ABNORMAL LOW (ref 8.9–10.3)
Chloride: 110 mmol/L (ref 98–111)
Creatinine, Ser: 1.84 mg/dL — ABNORMAL HIGH (ref 0.44–1.00)
GFR, Estimated: 26 mL/min — ABNORMAL LOW (ref >60)
Glucose, Bld: 87 mg/dL (ref 70–99)
Potassium: 4 mmol/L (ref 3.5–5.1)
Sodium: 143 mmol/L (ref 135–145)

## 2021-04-05 LAB — GLUCOSE, CAPILLARY
Glucose-Capillary: 106 mg/dL — ABNORMAL HIGH (ref 70–99)
Glucose-Capillary: 106 mg/dL — ABNORMAL HIGH (ref 70–99)
Glucose-Capillary: 121 mg/dL — ABNORMAL HIGH (ref 70–99)
Glucose-Capillary: 36 mg/dL — CL (ref 70–99)
Glucose-Capillary: 56 mg/dL — ABNORMAL LOW (ref 70–99)
Glucose-Capillary: 56 mg/dL — ABNORMAL LOW (ref 70–99)
Glucose-Capillary: 57 mg/dL — ABNORMAL LOW (ref 70–99)
Glucose-Capillary: 60 mg/dL — ABNORMAL LOW (ref 70–99)
Glucose-Capillary: 61 mg/dL — ABNORMAL LOW (ref 70–99)
Glucose-Capillary: 68 mg/dL — ABNORMAL LOW (ref 70–99)
Glucose-Capillary: 88 mg/dL (ref 70–99)
Glucose-Capillary: 89 mg/dL (ref 70–99)
Glucose-Capillary: 94 mg/dL (ref 70–99)
Glucose-Capillary: 98 mg/dL (ref 70–99)

## 2021-04-05 LAB — URINE CULTURE: Culture: NO GROWTH

## 2021-04-05 LAB — CBC
HCT: 38.1 % (ref 36.0–46.0)
Hemoglobin: 12 g/dL (ref 12.0–15.0)
MCH: 32.5 pg (ref 26.0–34.0)
MCHC: 31.5 g/dL (ref 30.0–36.0)
MCV: 103.3 fL — ABNORMAL HIGH (ref 80.0–100.0)
Platelets: 200 10*3/uL (ref 150–400)
RBC: 3.69 MIL/uL — ABNORMAL LOW (ref 3.87–5.11)
RDW: 13.4 % (ref 11.5–15.5)
WBC: 16.9 10*3/uL — ABNORMAL HIGH (ref 4.0–10.5)
nRBC: 0 % (ref 0.0–0.2)

## 2021-04-05 LAB — ECHOCARDIOGRAM COMPLETE
Height: 62 in
MV M vel: 5.61 m/s
MV Peak grad: 125.9 mmHg
S' Lateral: 2 cm
Weight: 1940.05 oz

## 2021-04-05 LAB — PROCALCITONIN: Procalcitonin: 5.74 ng/mL

## 2021-04-05 MED ORDER — DEXTROSE 10 % IV SOLN: INTRAVENOUS | Status: DC

## 2021-04-05 MED ORDER — DEXTROSE 50 % IV SOLN
12.5000 g | INTRAVENOUS | Status: AC
  Administered 2021-04-05: 12.5 g via INTRAVENOUS

## 2021-04-05 MED ORDER — FUROSEMIDE 10 MG/ML IJ SOLN
20.0000 mg | Freq: Once | INTRAMUSCULAR | Status: AC
  Administered 2021-04-05: 20 mg via INTRAVENOUS
  Filled 2021-04-05: qty 2

## 2021-04-05 MED ORDER — DEXTROSE 50 % IV SOLN
INTRAVENOUS | Status: AC
  Administered 2021-04-05: 25 mL via INTRAVENOUS
  Filled 2021-04-05: qty 50

## 2021-04-05 MED ORDER — DEXTROSE 50 % IV SOLN
INTRAVENOUS | Status: AC
  Filled 2021-04-05: qty 50

## 2021-04-05 MED ORDER — DEXTROSE 50 % IV SOLN
INTRAVENOUS | Status: AC
  Administered 2021-04-05: 25 g via INTRAVENOUS
  Filled 2021-04-05: qty 50

## 2021-04-05 MED ORDER — DEXTROSE 50 % IV SOLN: 25.0000 g | INTRAVENOUS | Status: AC

## 2021-04-05 MED ORDER — DEXTROSE 50 % IV SOLN
25.0000 mL | Freq: Once | INTRAVENOUS | Status: AC
  Administered 2021-04-05: 25 mL via INTRAVENOUS

## 2021-04-05 NOTE — Progress Notes (Signed)
Pt transferred to 4 east, CHG completed, tele initiated.  Bed in lowest locked position, call bell within reach.

## 2021-04-05 NOTE — Progress Notes (Signed)
NAME:  Christina Ortiz, MRN:  [id number], DOB:  Nov 17, 1929, LOS: 2 ADMISSION DATE:  04/03/2021, CONSULTATION DATE:  04/03/2021 REFERRING MD:  Dr. Clarene Duke, CHIEF COMPLAINT: Hypertensive crisis with flash pulmonary edema    History of Present Illness:  Christina Ortiz is a 87 y.o. female with PMH significant for but not limited to PAF anticoagulated on half dose Eliquis, CKD, HTN, HLD, hypothyroidism, GERD, and mild dementia who presented from local ALF with AMS, SOB, and severe hypertension. Per daughter ALF reported AMS with episode of vomiting followed by hypoxia and later assess severe hypertension. Daughter additionally reports patient was recently diagnosed with a UTI at ALF.   Initially on arrival patient was seen tachypneic and hypertensive with B/P 203/137. Oxygen saturations were appropriate on 4 LNC. At that time she was placed in a Nitro drip for hypertensive urgency and plan was to admit to Hospitalist. Unfortunately patient later became more altered, hypoxic, and hypotensive. This prompted discontinuation of Nitor, application of BIPAP and consult to PCCM. Pertinent labs included creatine 1.16, alkaline phos 130, BNP 1105, HS troponin 38, and lactic acid 2.4,   Pertinent  Medical History  Significant for but not limited to; PAF anticoagulated on half dose Eliquis CKD HTN HLD Hypothyroidism GERD Mild dementia   Significant Hospital Events: Including procedures, antibiotic start and stop dates in addition to other pertinent events   . 4/8 Admitted with possible aspiration event vs flash pulmonary edema in the setting of hypertensive urgency  . 4/9 Started on precedex for severe agitation . 4/10 Off precedex. Tolerated haldol PRNs  Interim History / Subjective:  Oriented and calm this morning. Updated daughter and son-in-law at bedside.  Objective   Blood pressure (!) 143/84, pulse 80, temperature 98.5 F (36.9 C), temperature source Oral, resp. rate 20, height 5\' 2"  (1.575 m),  weight 55 kg, SpO2 96 %.        Intake/Output Summary (Last 24 hours) at 04/05/2021 2049 Last data filed at 04/05/2021 1600 Gross per 24 hour  Intake 421.79 ml  Output 150 ml  Net 271.79 ml   Filed Weights   04/03/21 1334 04/04/21 0429 04/05/21 0415  Weight: 45.4 kg 55.1 kg 55 kg   Physical Exam: General: Chronically ill-appearing, no acute distress HENT: [place], AT, OP clear, MMM Eyes: EOMI, no scleral icterus Respiratory: Upper airway rhonchi bilaterally. Cardiovascular: RRR, -M/R/G, no JVD GI: BS+, soft, nontender Extremities:-Edema,-tenderness Neuro: AAO x 2, CNII-XII grossly intact  Labs/imaging that I have   4/8 CXR > Persistent cardiomegaly. Left base atelectasis.  4/8 Head CT > Atrophy with stable supratentorial small vessel disease. 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  WBC 16.9 unchanged  BUN/Cr 48/1.84, worsening . Imaging, labs and test noted above have been reviewed independently by me.  Resolved Hospital Problem list     Assessment & Plan:  Acute hypoxic respiratory failure secondary to aspiration. Possible component of heart failure with elevated BNP  P: Continue HF and PRN BiPAP for SpO2 >90% Swallow evaluation Aspiration precautions Follow intermittent chest x-ray  Ensure adequate pulmonary hygiene  Empiric antibiotics as below  Acute encephalopathy - resolved Multifactorial in setting of infection, hypertension, delirium P:  PRN Haldol  Hypertensive urgency P: PRN hydralazine for SBP >170 Lasix 20 mg once  Severe sepsis secondary to aspiration, presumed UTI -Reportedly positive UA at SNF. No evidence of infection on admission. Concern for aspiration with reported emesis P: Continue Ceftriaxone  F/u cultures  Trend lactic acid  Elevated BNP  -  Concern for flash pulmonary edema and possible CHF component P: Diurese as able  Strict I&O Daily weight   Paroxysmal A-fib anticoagulated on half dose Xarelto   P: Continuous telemetry  Hold home Xarelto until able to take orals, may need to consider heparin drip  Hold home Beta blocker until BP stabilizes   Hypothyroidism P: Continue home synthroid when able to take orals   Hx of HTN Hx of HLD -Home medication rec pending  P: Hold home meds until able to take orals safely   Acute on CKD stage 3b -Baseline GFR low to mid 40 -Slightly worsening. Suspect hypertensive injury vs cardiorenal P: Diurese Follow Cr/UOP Trend BMET Avoid nephrotoxins Ensure adequate renal perfusion   At risk malnutrition P: Start D10 Supplement protein when able   Best practice   Diet:  NPO Pain/Anxiety/Delirium protocol (if indicated): No VAP protocol (if indicated): Not indicated DVT prophylaxis: Subcutaneous Heparin GI prophylaxis: PPI Glucose control:  SSI No Central venous access:  N/A Arterial line:  N/A Foley:  N/A Mobility:  OOB  PT consulted: N/A Last date of multidisciplinary goals of care discussion 4/9 DNR Code Status:  DNR Disposition: Transfer to [facility] 4/11 and PCU  Labs   CBC: Recent Labs  Lab 04/03/21 1352 04/03/21 1447 04/03/21 2014 04/03/21 2234 04/04/21 0006 04/04/21 0812 04/04/21 2356  WBC 11.0*  --  18.3*  --  16.2*  --  16.9*  NEUTROABS 8.8*  --   --   --   --   --   --   HGB 15.0   < > 15.8* 13.9 14.7 14.6 12.0  HCT 47.3*   < > 48.0* 41.0 44.8 43.0 38.1  MCV 102.6*  --  100.4*  --  101.8*  --  103.3*  PLT 252  --  230  --  211  --  200   < > = values in this interval not displayed.    Basic Metabolic Panel: Recent Labs  Lab 04/03/21 1352 04/03/21 1447 04/03/21 2014 04/03/21 2234 04/04/21 0006 04/04/21 0812 04/04/21 2356  NA 141 141  --  142 140 144 143  K 4.7 5.1  --  3.9 5.0 4.0 4.0  CL 105  --   --   --  105  --  110  CO2 25  --   --   --  21*  --  24  GLUCOSE 93  --   --   --  73  --  87  BUN 29*  --   --   --  37*  --  48*  CREATININE 1.16*  --  1.51*  --  1.39*  --  1.84*  CALCIUM 9.4  --    --   --  9.0  --  8.5*  MG  --   --   --   --  2.4  --   --   PHOS  --   --   --   --  4.8*  --   --    GFR: Estimated Creatinine Clearance: 16.4 mL/min (A) (by C-G formula based on SCr of 1.84 mg/dL (H)). Recent Labs  Lab 04/03/21 1352 04/03/21 1354 04/03/21 1603 04/03/21 2014 04/03/21 2148 04/03/21 2320 04/04/21 0006 04/04/21 1104 04/04/21 2356  PROCALCITON  --   --   --   --   --  1.24 1.09  --  5.74  WBC 11.0*  --   --  18.3*  --   --  16.2*  --  16.9*  LATICACIDVEN  --  2.4* 3.5*  --  3.1*  --   --  5.4*  --     Liver Function Tests: Recent Labs  Lab 04/03/21 1352  AST 29  ALT 10  ALKPHOS 130*  BILITOT 1.4*  PROT 7.4  ALBUMIN 3.3*   No results for input(s): LIPASE, AMYLASE in the last 168 hours. No results for input(s): AMMONIA in the last 168 hours.  ABG    Component Value Date/Time   PHART 7.347 (L) 04/04/2021 0812   PCO2ART 37.4 04/04/2021 0812   PO2ART 83 04/04/2021 0812   HCO3 20.5 04/04/2021 0812   TCO2 22 04/04/2021 0812   ACIDBASEDEF 5.0 (H) 04/04/2021 0812   O2SAT 96.0 04/04/2021 0812      Care time: 35 min   Mechele Collin, M.D. [facility] Pulmonary/Critical Care Medicine 04/05/2021 8:49 PM

## 2021-04-05 NOTE — Progress Notes (Signed)
eLink Physician-Brief Progress Note Patient Name: Christina Ortiz DOB: 1929/07/10 MRN: [id number]   Date of Service  04/05/2021  HPI/Events of Note  Recurrent hypoglycemia (CBG 30-60) now s/p second D50 amp this evening. Patient is NPO and without any dextrose-containing fluids running.   eICU Interventions  Ordered D10W at 40cc/hr and Q1H CBGs for the next 4 hours.     Intervention Category Intermediate Interventions: Other:  Janae Bridgeman 04/05/2021, 5:10 AM

## 2021-04-05 NOTE — Progress Notes (Signed)
  Echocardiogram 2D Echocardiogram has been performed.  Delcie Roch 04/05/2021, 4:53 PM

## 2021-04-06 ENCOUNTER — Inpatient Hospital Stay (HOSPITAL_COMMUNITY): Payer: Medicare PPO

## 2021-04-06 DIAGNOSIS — J690 Pneumonitis due to inhalation of food and vomit: Secondary | ICD-10-CM

## 2021-04-06 LAB — BASIC METABOLIC PANEL
Anion gap: 12 (ref 5–15)
BUN: 38 mg/dL — ABNORMAL HIGH (ref 8–23)
CO2: 24 mmol/L (ref 22–32)
Calcium: 9 mg/dL (ref 8.9–10.3)
Chloride: 106 mmol/L (ref 98–111)
Creatinine, Ser: 1.27 mg/dL — ABNORMAL HIGH (ref 0.44–1.00)
GFR, Estimated: 40 mL/min — ABNORMAL LOW (ref >60)
Glucose, Bld: 97 mg/dL (ref 70–99)
Potassium: 3.7 mmol/L (ref 3.5–5.1)
Sodium: 142 mmol/L (ref 135–145)

## 2021-04-06 LAB — GLUCOSE, CAPILLARY
Glucose-Capillary: 80 mg/dL (ref 70–99)
Glucose-Capillary: 97 mg/dL (ref 70–99)
Glucose-Capillary: 89 mg/dL (ref 70–99)
Glucose-Capillary: 36 mg/dL — CL (ref 70–99)
Glucose-Capillary: 58 mg/dL — ABNORMAL LOW (ref 70–99)
Glucose-Capillary: 65 mg/dL — ABNORMAL LOW (ref 70–99)
Glucose-Capillary: 84 mg/dL (ref 70–99)
Glucose-Capillary: 55 mg/dL — ABNORMAL LOW (ref 70–99)
Glucose-Capillary: 192 mg/dL — ABNORMAL HIGH (ref 70–99)
Glucose-Capillary: 149 mg/dL — ABNORMAL HIGH (ref 70–99)

## 2021-04-06 LAB — CBC
HCT: 43.1 % (ref 36.0–46.0)
Hemoglobin: 14.3 g/dL (ref 12.0–15.0)
MCH: 33.1 pg (ref 26.0–34.0)
MCHC: 33.2 g/dL (ref 30.0–36.0)
MCV: 99.8 fL (ref 80.0–100.0)
Platelets: 193 10*3/uL (ref 150–400)
RBC: 4.32 MIL/uL (ref 3.87–5.11)
RDW: 13.5 % (ref 11.5–15.5)
WBC: 13.5 10*3/uL — ABNORMAL HIGH (ref 4.0–10.5)
nRBC: 0 % (ref 0.0–0.2)

## 2021-04-06 LAB — TSH: TSH: 4.73 u[IU]/mL — ABNORMAL HIGH (ref 0.350–4.500)

## 2021-04-06 LAB — T4, FREE: Free T4: 1.15 ng/dL — ABNORMAL HIGH (ref 0.61–1.12)

## 2021-04-06 LAB — BRAIN NATRIURETIC PEPTIDE: B Natriuretic Peptide: 593.2 pg/mL — ABNORMAL HIGH (ref 0.0–100.0)

## 2021-04-06 MED ORDER — DEXTROSE 50 % IV SOLN
INTRAVENOUS | Status: AC
  Filled 2021-04-06: qty 50

## 2021-04-06 MED ORDER — RESOURCE THICKENUP CLEAR PO POWD
ORAL | Status: DC | PRN
  Filled 2021-04-06: qty 125

## 2021-04-06 MED ORDER — DEXTROSE 50 % IV SOLN
INTRAVENOUS | Status: AC
  Administered 2021-04-06: 12.5 g via INTRAVENOUS
  Filled 2021-04-06: qty 50

## 2021-04-06 MED ORDER — DEXTROSE 50 % IV SOLN: 12.5000 g | INTRAVENOUS | Status: AC

## 2021-04-06 MED ORDER — DEXTROSE 50 % IV SOLN
25.0000 mL | Freq: Once | INTRAVENOUS | Status: AC
  Administered 2021-04-06: 25 mL via INTRAVENOUS

## 2021-04-06 MED ORDER — FUROSEMIDE 10 MG/ML IJ SOLN
20.0000 mg | Freq: Every day | INTRAMUSCULAR | Status: DC
  Administered 2021-04-06 – 2021-04-08 (×3): 20 mg via INTRAVENOUS
  Filled 2021-04-06 (×3): qty 2

## 2021-04-06 MED ORDER — DEXTROSE 50 % IV SOLN
12.5000 g | INTRAVENOUS | Status: AC
  Filled 2021-04-06: qty 50

## 2021-04-06 NOTE — Progress Notes (Signed)
Family Communication Note  Spoke with patient's daughter [name] about swallow study results. We talked about silent aspiration, complications, and possible approaches to management -- we talked a sort of fork in the road between about aggressive management for nutrition support (cortrak, EN) vs consideration of a more palliative approach with pleasure/comfort feeds + symptom management of respiratory sequelae of aspiration. [name] feels like her mother would probably never want a feeding tube of any sort, but worries that without we would be "right back to where we started."   I introduced the idea of palliative care as a medical field, and she would appreciate a PCM consult to help establish goals of care.  I will consult palliative care Will continue NPO and D10 for now   Tessie Fass MSN, AGACNP-BC [place] Pulmonary/Critical Care Medicine Please see amion for personal pager details  04/06/2021, 4:53 PM

## 2021-04-06 NOTE — Progress Notes (Signed)
Modified Barium Swallow Progress Note  Patient Details  Name: Christina Ortiz MRN: [id number] Date of Birth: 1929/03/16  Today's Date: 04/06/2021  Modified Barium Swallow completed.  Full report located under Chart Review in the Imaging Section.  Brief recommendations include the following:  Clinical Impression Pt presents with an oropharyngeal dysphagia in which her oral phase is consistently characterized by reduced/delayed posterior propulsion, lingual residue, weak lingual manipulation, lingual pumping and decreased bolus cohesion that led to reduced oral control of boluses. Anterior spillage was also noted with thin liquids via the cup but this improved with trials of nectar and honey thick liquids. When given more solid textures, it appeared that the pt barely chewed the boluses before propelling them back even with verbal cues to chew thoroughly. Pt consistently demonstrated reduced airway protection in which timing and airway closure were not appropriately coordinated during her pharyngeal phase. This combined with her decreased oral control of boluses allowed for boluses to spill into her airway during her swallow resulting in silent aspiration (PAS 8) of all liquids and penetration across consistencies. Vallecular residue was also noted due to reduced base of tongue retraction which also resulted in aspiration of boluses after her initial swallow. She was unable to clear aspirates and penetrates even when given visual cues to cough. Although aspiration was not observed with solids, consistent penetration even to the vocal folds, as well as her inability to clear penetrates, is concerning for potential aspiration across meals. Given her cognition, SLP was unable to attempt compensatory strategies to examine if this could help improve her airway protection. Recommend that the pt be NPO with f/u for potential to participate in swallowing therapy, although baseline mentation and swallowing function  is unknown to accurately assess prognosis. SLP discussed the results of this study with her NP who also agrees with this recommendation and plans to f/u further with pt/family.    Swallow Evaluation Recommendations   SLP Diet Recommendations: NPO       Medication Administration: Via alternative means               Oral Care Recommendations: Oral care QID        Zettie Cooley., SLP Student 04/06/2021,3:32 PM

## 2021-04-06 NOTE — Evaluation (Signed)
Clinical/Bedside Swallow Evaluation Patient Details  Name: Christina Ortiz MRN: [id number] Date of Birth: December 30, 1928  Today's Date: 04/06/2021 Time: SLP Start Time (ACUTE ONLY): 0910 SLP Stop Time (ACUTE ONLY): 0950 SLP Time Calculation (min) (ACUTE ONLY): 40 min  Past Medical History:  Past Medical History:  Diagnosis Date  . Acute bronchitis 10/2017  . Arthritis   . CKD (chronic kidney disease)    Xarelto dose is 15 mg QD  . Coronary artery disease    a. s/p PCI/stenting to RCA and LAD ([facility] in [place]) in 2006; b. Nuc 1/15: No ischemia, EF 70%  . Depression   . Gallstones   . Gastritis   . GERD (gastroesophageal reflux disease)   . Hiatal hernia   . Hip fracture (HCC)   . History of cholecystectomy   . History of echocardiogram    a. Echo 1/15: Moderate LVH, EF 55-60%, impaired relaxation, mild AS  . Hyperlipidemia   . Hypertension   . Hypokalemia   . Hypothyroidism   . Mobitz type 1 second degree atrioventricular block    a. Event Monitor 3/15: NSR, first-degree AV block, second-degree AB block - Mobitz 1, PACs, NSVT (5 beats)  . PAF (paroxysmal atrial fibrillation) (HCC)   . Thyroid disease    Past Surgical History:  Past Surgical History:  Procedure Laterality Date  . ANGIOPLASTY  2006   with sent placement  . BACK SURGERY     Dr Shon Baton  . BILIARY DILATION  10/18/2019   Procedure: BILIARY DILATION;  Surgeon: Rachael Fee, MD;  Location: [facility] ENDOSCOPY;  Service: Endoscopy;;  . BILIARY DILATION  12/17/2019   Procedure: BILIARY DILATION;  Surgeon: [name], MD;  Location: [facility] ENDOSCOPY;  Service: Endoscopy;;  . BILIARY DILATION  02/07/2020   Procedure: BILIARY DILATION;  Surgeon: Lemar Lofty., MD;  Location: [facility] ENDOSCOPY;  Service: Gastroenterology;;  . BILIARY DILATION  09/10/2020   Procedure: BILIARY DILATION;  Surgeon: Lemar Lofty., MD;  Location: [facility] ENDOSCOPY;  Service: Gastroenterology;;  . BILIARY STENT PLACEMENT N/A  10/18/2019   Procedure: BILIARY STENT PLACEMENT;  Surgeon: Rachael Fee, MD;  Location: [facility] ENDOSCOPY;  Service: Endoscopy;  Laterality: N/A;  . BILIARY STENT PLACEMENT N/A 12/17/2019   Procedure: BILIARY STENT PLACEMENT;  Surgeon: [name], MD;  Location: [facility] ENDOSCOPY;  Service: Endoscopy;  Laterality: N/A;  . BILIARY STENT PLACEMENT  02/07/2020   Procedure: BILIARY STENT PLACEMENT;  Surgeon: Meridee Score Netty Starring., MD;  Location: [facility] ENDOSCOPY;  Service: Gastroenterology;;  . BLEPHAROPLASTY    . BREAST LUMPECTOMY Left   . CARDIAC CATHETERIZATION  2006  . CATARACT EXTRACTION Bilateral   . CHOLECYSTECTOMY    . DILATION AND CURETTAGE OF UTERUS    . ENDOSCOPIC RETROGRADE CHOLANGIOPANCREATOGRAPHY (ERCP) WITH PROPOFOL N/A 12/17/2019   Procedure: ENDOSCOPIC RETROGRADE CHOLANGIOPANCREATOGRAPHY (ERCP) WITH PROPOFOL;  Surgeon: [name], MD;  Location: [facility] ENDOSCOPY;  Service: Endoscopy;  Laterality: N/A;  . ENDOSCOPIC RETROGRADE CHOLANGIOPANCREATOGRAPHY (ERCP) WITH PROPOFOL N/A 02/07/2020   Procedure: ENDOSCOPIC RETROGRADE CHOLANGIOPANCREATOGRAPHY (ERCP) WITH PROPOFOL;  Surgeon: Meridee Score Netty Starring., MD;  Location: [facility] ENDOSCOPY;  Service: Gastroenterology;  Laterality: N/A;  . ENDOSCOPIC RETROGRADE CHOLANGIOPANCREATOGRAPHY (ERCP) WITH PROPOFOL N/A 09/10/2020   Procedure: ENDOSCOPIC RETROGRADE CHOLANGIOPANCREATOGRAPHY (ERCP) WITH PROPOFOL;  Surgeon: Meridee Score Netty Starring., MD;  Location: [facility] ENDOSCOPY;  Service: Gastroenterology;  Laterality: N/A;  . ERCP N/A 10/18/2019   Procedure: ENDOSCOPIC RETROGRADE CHOLANGIOPANCREATOGRAPHY (ERCP);  Surgeon: Rachael Fee, MD;  Location: [facility] ENDOSCOPY;  Service: Endoscopy;  Laterality: N/A;  . ESOPHAGOGASTRODUODENOSCOPY (EGD) WITH PROPOFOL N/A 12/17/2019   Procedure: ESOPHAGOGASTRODUODENOSCOPY (EGD) WITH PROPOFOL;  Surgeon: [name], MD;  Location: [facility] ENDOSCOPY;  Service: Endoscopy;  Laterality: N/A;  . INTRAMEDULLARY (IM) NAIL  INTERTROCHANTERIC Right 01/25/2021   Procedure: INTRAMEDULLARY (IM) NAIL INTERTROCHANTRIC;  Surgeon: Durene Romans, MD;  Location: [facility] OR;  Service: Orthopedics;  Laterality: Right;  . KYPHOPLASTY N/A 01/03/2020   Procedure: KYPHOPLASTY LUMBAR TWO;  Surgeon: Venita Lick, MD;  Location: [facility] OR;  Service: Orthopedics;  Laterality: N/A;  . REMOVAL OF STONES  10/18/2019   Procedure: REMOVAL OF STONES;  Surgeon: Rachael Fee, MD;  Location: [facility] ENDOSCOPY;  Service: Endoscopy;;  . REMOVAL OF STONES  12/17/2019   Procedure: REMOVAL OF STONES;  Surgeon: [name], MD;  Location: [facility] ENDOSCOPY;  Service: Endoscopy;;  . REMOVAL OF STONES  02/07/2020   Procedure: REMOVAL OF STONES;  Surgeon: Lemar Lofty., MD;  Location: [facility] ENDOSCOPY;  Service: Gastroenterology;;  . REMOVAL OF STONES  09/10/2020   Procedure: REMOVAL OF STONES;  Surgeon: Lemar Lofty., MD;  Location: [facility] ENDOSCOPY;  Service: Gastroenterology;;  . Gaspar Bidding DILATION N/A 12/17/2019   Procedure: Gaspar Bidding DILATION;  Surgeon: [name], MD;  Location: [facility] ENDOSCOPY;  Service: Endoscopy;  Laterality: N/A;  . SPHINCTEROTOMY  10/18/2019   Procedure: SPHINCTEROTOMY;  Surgeon: Rachael Fee, MD;  Location: [facility] ENDOSCOPY;  Service: Endoscopy;;  . Dennison Mascot  12/17/2019   Procedure: Dennison Mascot;  Surgeon: [name], MD;  Location: [facility] ENDOSCOPY;  Service: Endoscopy;;  . Burman Freestone CHOLANGIOSCOPY N/A 02/07/2020   Procedure: MVEHMCNO CHOLANGIOSCOPY;  Surgeon: Lemar Lofty., MD;  Location: [facility] ENDOSCOPY;  Service: Gastroenterology;  Laterality: N/A;  Burman Freestone LITHOTRIPSY N/A 02/07/2020   Procedure: BSJGGEZM LITHOTRIPSY;  Surgeon: Lemar Lofty., MD;  Location: [facility] ENDOSCOPY;  Service: Gastroenterology;  Laterality: N/A;  . STENT REMOVAL  12/17/2019   Procedure: STENT REMOVAL;  Surgeon: [name], MD;  Location: [facility] ENDOSCOPY;  Service: Endoscopy;;  . STENT REMOVAL  02/07/2020   Procedure:  STENT REMOVAL;  Surgeon: Lemar Lofty., MD;  Location: [facility] ENDOSCOPY;  Service: Gastroenterology;;  . Francine Graven REMOVAL  09/10/2020   Procedure: STENT REMOVAL;  Surgeon: Lemar Lofty., MD;  Location: [facility] ENDOSCOPY;  Service: Gastroenterology;;   HPI:  Pt is an 87 y.o. female with acute hypoxemic respiratory failure secondary to potential aspiration episode with vomiting. Also presents with sepsis secondary to presumed UTI. CXR 4/11: Worsening patchy airspace disease in the left mid lung and right lower lobe. Esophagram (2019): Significant distal esophageal stricture above a moderate-sized hiatal hernia; Esophageal dysmotility. PMH: GERD, HTN, hiatal hernia, gastritis   Assessment / Plan / Recommendation Clinical Impression  Pt observed with throat clearing across all consistencies with overt coughing noted with thin liquids and nectar thick liquids but not with honey thick liquids via cup sips. Anterior spillage occurred with cup sips but this improved with thicker liquids. Oral residue was also noted with solids in which she benefited from liquid washes. Pt does have an esophageal hx so question the role this plays on her oropharyngeal swallow. However, given these signs of potential aspiration and that her CXR from today showed worsening patchy airspace disease, an MBS has been scheduled. In the interim, recommend a DYS 1 diet with honey thick liquids via cup (no straw) given that no overt coughing occurred with these consistencies. Discussed with NP, who is in agreement with plan.   SLP Visit Diagnosis: Dysphagia, unspecified (  R13.10)    Aspiration Risk  Mild aspiration risk    Diet Recommendation Dysphagia 1 (Puree);Honey-thick liquid   Liquid Administration via: Cup;No straw Medication Administration: Crushed with puree Supervision: Patient able to self feed;Full supervision/cueing for compensatory strategies Compensations: Slow rate;Small sips/bites;Minimize environmental  distractions Postural Changes: Seated upright at 90 degrees;Remain upright for at least 30 minutes after po intake    Other  Recommendations Oral Care Recommendations: Oral care BID Other Recommendations: Order thickener from pharmacy;Prohibited food (jello, ice cream, thin soups);Remove water pitcher   Follow up Recommendations  (tba)      Frequency and Duration            Prognosis Prognosis for Safe Diet Advancement: Good Barriers to Reach Goals: Cognitive deficits      Swallow Study   General HPI: Pt is an 87 y.o. female with acute hypoxemic respiratory failure secondary to potential aspiration episode with vomiting. Also presents with sepsis secondary to presumed UTI. CXR 4/11: Worsening patchy airspace disease in the left mid lung and right lower lobe. Esophagram (2019): Significant distal esophageal stricture above a moderate-sized hiatal hernia; Esophageal dysmotility. PMH: GERD, HTN, hiatal hernia, gastritis Type of Study: Bedside Swallow Evaluation Previous Swallow Assessment: None in chart Diet Prior to this Study: NPO Temperature Spikes Noted: No Respiratory Status: Room air History of Recent Intubation: No Behavior/Cognition: Alert;Cooperative;Pleasant mood Oral Cavity Assessment: Within Functional Limits Oral Care Completed by SLP: Yes Oral Cavity - Dentition: Adequate natural dentition Vision: Functional for self-feeding Self-Feeding Abilities: Able to feed self Patient Positioning: Upright in bed Baseline Vocal Quality: Normal Volitional Cough: Weak Volitional Swallow: Able to elicit    Oral/Motor/Sensory Function Overall Oral Motor/Sensory Function: Other (comment) (Good lingual and facial symmetry,difficulty assessing all due to difficulty following commands)   Ice Chips Ice chips: Within functional limits Presentation: Spoon   Thin Liquid Thin Liquid: Impaired Presentation: Cup;Self Fed;Straw Oral Phase Functional Implications: Right anterior  spillage;Left anterior spillage Pharyngeal  Phase Impairments: Throat Clearing - Immediate;Cough - Immediate    Nectar Thick Nectar Thick Liquid: Impaired Presentation: Cup;Straw Pharyngeal Phase Impairments: Cough - Immediate;Throat Clearing - Immediate;Throat Clearing - Delayed   Honey Thick Honey Thick Liquid: Impaired Presentation: Cup;Self fed;Straw Pharyngeal Phase Impairments: Throat Clearing - Delayed;Throat Clearing - Immediate   Puree Puree: Impaired Presentation: Self Fed;Spoon Pharyngeal Phase Impairments: Throat Clearing - Delayed   Solid     Solid: Impaired Presentation: Self Fed Oral Phase Impairments: Other (comment) (oral residue) Pharyngeal Phase Impairments: Throat Clearing - Delayed      Zettie Cooley., SLP Student 04/06/2021,11:54 AM

## 2021-04-06 NOTE — Progress Notes (Addendum)
NAME:  Christina Ortiz, MRN:  [id number], DOB:  June 17, 1929, LOS: 3 ADMISSION DATE:  04/03/2021, CONSULTATION DATE:  04/03/2021 REFERRING MD:  Dr. Clarene Duke, CHIEF COMPLAINT: Hypertensive crisis with flash pulmonary edema    History of Present Illness:  Christina Ortiz is a 87 y.o. female with PMH significant for but not limited to PAF anticoagulated on half dose Eliquis, CKD, HTN, HLD, hypothyroidism, GERD, and mild dementia who presented from local ALF with AMS, SOB, and severe hypertension. Per daughter ALF reported AMS with episode of vomiting followed by hypoxia and later assess severe hypertension. Daughter additionally reports patient was recently diagnosed with a UTI at ALF.   Initially on arrival patient was seen tachypneic and hypertensive with B/P 203/137. Oxygen saturations were appropriate on 4 LNC. At that time she was placed in a Nitro drip for hypertensive urgency and plan was to admit to Hospitalist. Unfortunately patient later became more altered, hypoxic, and hypotensive. This prompted discontinuation of Nitor, application of BIPAP and consult to PCCM. Pertinent labs included creatine 1.16, alkaline phos 130, BNP 1105, HS troponin 38, and lactic acid 2.4,   Pertinent  Medical History  Significant for but not limited to; PAF anticoagulated on half dose Eliquis CKD HTN HLD Hypothyroidism GERD Mild dementia   Significant Hospital Events: Including procedures, antibiotic start and stop dates in addition to other pertinent events   . 4/8 Admitted with possible aspiration event vs flash pulmonary edema in the setting of hypertensive urgency  . 4/9 Started on precedex for severe agitation . 4/10 Off precedex. Tolerated haldol PRNs . 4/11 back on HFNC   Interim History / Subjective:  Put on HFNC overnight   Weaned to RA this morning, SpO2 >95%  Objective   Blood pressure (!) 165/89, pulse 85, temperature (!) 97.5 F (36.4 C), temperature source Oral, resp. rate (!) 23, height  5\' 2"  (1.575 m), weight 53.7 kg, SpO2 100 %.        Intake/Output Summary (Last 24 hours) at 04/06/2021 0814 Last data filed at 04/06/2021 0331 Gross per 24 hour  Intake 319.5 ml  Output 500 ml  Net -180.5 ml   Filed Weights   04/04/21 0429 04/05/21 0415 04/06/21 0400  Weight: 55.1 kg 55 kg 53.7 kg   Physical Exam: General: Chronically ill elderly F, reclined in bed NAD  HENT: NCAT, very dry mm and tongue, anicteric sclera  Respiratory: Scattered rhonchi, diminished bibasilar sounds. Shallow, unlabored respiraitons  Cardiovascular: reg rate, irreg rhythm. s1s2 cap refill <3sec GI: soft ndnt + bowel sounds  Extremities: No acute joint deformity no cyanosis or clubbing  Neuro: AAOx2 following commands Skin: scattered ecchymosis   Labs/imaging that I have   4/8 CXR > Persistent cardiomegaly. Left base atelectasis.  4/8 Head CT > Atrophy with stable supratentorial small vessel disease. 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  4/10 ECHO: LVEF 60-65%. Moderate LVH. Diastolic fxn indeterminant due to Afib. Normal RV systolic fxn. Moderately elevated pulmonary artery systolic pressure. RVSP 47 mmHg. Massively dilated LA. Severely dilated RA. Myxomatous MV, moderate MVR. Mile-moderate TVR. Mild AV calcification, mild-mod AV sclerosis without evidence of stenosis  4/11: BMP- Cr down to 1.27 CBC- WBC down to 13.5    Resolved Hospital Problem list     Assessment & Plan:   Acute hypoxic respiratory failure: multifactorial -aspiration + Likely component of heart failure given BNP >1000 P:  -CXR 4/11, check BNP 4/11. If CXR is worse, may need to consider broadening abx for MRSA  coverage (though wbc is down trending) vs more aggressive diuresis -wean O2 as able -aspiration precautions -needs pulm hygiene -- ordering flutter and IS 4/11, as well as PT consult  -ceftriaxone -diuresis: lasix 20mg  qDS   Acute encephalopathy- resolved Multifactorial in setting of  infection, hypertension, delirium P:  -delirium precautions  AKI on CKD 3b- improving AKI P: -trend BMP, UOP   Afib  -takes half dose xarelto at home  HTN HLD  P: -cardiac monitoring -holding home xarelto (NPO) -- pending SLP eval 4/11 -holding home antihypertensives -- continuing gentle diuresis 4/11, PRN hydral available if needed  -holding statin (NPO)   Hx hypothyroidism P: -holding synthroid (is NPO, but I also see reference to needing follow up labs before further refills) -will check TSH, free T4  Hypoglycemia Inadequate PO intake, at risk for malnutrition  Possible dysphagia (possible etiology of aspiration event)  P: -cont D10 while NPO -SLP eval pending-- adv diet vs further evaluation as appropriate    Best practice   Diet:  NPO SLP eval pending  Pain/Anxiety/Delirium protocol (if indicated): No VAP protocol (if indicated): Not indicated DVT prophylaxis: Subcutaneous Heparin GI prophylaxis: PPI Glucose control:  SSI No Central venous access:  N/A Arterial line:  N/A Foley:  N/A Mobility:  OOB  PT consulted: Yes Last date of multidisciplinary goals of care discussion 4/9 DNR Code Status:  DNR Disposition: SDU. Will plan to transfer to [facility] beginning 4/12 with PCCM off   Labs   CBC: Recent Labs  Lab 04/03/21 1352 04/03/21 1447 04/03/21 2014 04/03/21 2234 04/04/21 0006 04/04/21 0812 04/04/21 2356 04/06/21 0203  WBC 11.0*  --  18.3*  --  16.2*  --  16.9* 13.5*  NEUTROABS 8.8*  --   --   --   --   --   --   --   HGB 15.0   < > 15.8* 13.9 14.7 14.6 12.0 14.3  HCT 47.3*   < > 48.0* 41.0 44.8 43.0 38.1 43.1  MCV 102.6*  --  100.4*  --  101.8*  --  103.3* 99.8  PLT 252  --  230  --  211  --  200 193   < > = values in this interval not displayed.    Basic Metabolic Panel: Recent Labs  Lab 04/03/21 1352 04/03/21 1447 04/03/21 2014 04/03/21 2234 04/04/21 0006 04/04/21 0812 04/04/21 2356 04/06/21 0203  NA 141   < >  --  142 140 144 143 142   K 4.7   < >  --  3.9 5.0 4.0 4.0 3.7  CL 105  --   --   --  105  --  110 106  CO2 25  --   --   --  21*  --  24 24  GLUCOSE 93  --   --   --  73  --  87 97  BUN 29*  --   --   --  37*  --  48* 38*  CREATININE 1.16*  --  1.51*  --  1.39*  --  1.84* 1.27*  CALCIUM 9.4  --   --   --  9.0  --  8.5* 9.0  MG  --   --   --   --  2.4  --   --   --   PHOS  --   --   --   --  4.8*  --   --   --    < > =  values in this interval not displayed.   GFR: Estimated Creatinine Clearance: 23.8 mL/min (A) (by C-G formula based on SCr of 1.27 mg/dL (H)). Recent Labs  Lab 04/03/21 1354 04/03/21 1603 04/03/21 2014 04/03/21 2148 04/03/21 2320 04/04/21 0006 04/04/21 1104 04/04/21 2356 04/06/21 0203  PROCALCITON  --   --   --   --  1.24 1.09  --  5.74  --   WBC  --   --  18.3*  --   --  16.2*  --  16.9* 13.5*  LATICACIDVEN 2.4* 3.5*  --  3.1*  --   --  5.4*  --   --     Liver Function Tests: Recent Labs  Lab 04/03/21 1352  AST 29  ALT 10  ALKPHOS 130*  BILITOT 1.4*  PROT 7.4  ALBUMIN 3.3*   No results for input(s): LIPASE, AMYLASE in the last 168 hours. No results for input(s): AMMONIA in the last 168 hours.  ABG    Component Value Date/Time   PHART 7.347 (L) 04/04/2021 0812   PCO2ART 37.4 04/04/2021 0812   PO2ART 83 04/04/2021 0812   HCO3 20.5 04/04/2021 0812   TCO2 22 04/04/2021 0812   ACIDBASEDEF 5.0 (H) 04/04/2021 0812   O2SAT 96.0 04/04/2021 0812     Tessie Fass MSN, AGACNP-BC [facility] Please refer to Amion for pager details  04/06/2021, 10:03 AM   PCCM:  87 yo FM, SLP MBSS today with evidence of aspiration into airways. She remains a little confused. Stable after transfer from the ICU. AKI on CKD 3b. AFIB on xarelto.   BP (!) 160/84 (BP Location: Right Arm)   Pulse 85   Temp (!) 97.5 F (36.4 C) (Oral)   Resp (!) 27   Ht 5\' 2"  (1.575 m)   Wt 53.7 kg   LMP  (LMP Unknown)   SpO2 100%   BMI 21.65 kg/m   Gen: elderly fm, resting in  bed  HENT: ncat Heart: RRR, s1 s2  Lungs: BL crackles in the base  Abd: soft nt nd   Labs: elevated PCT WBC 16.9 >> 13.5  A:  Bilateral Aspiration pneumonia MBSS, SLP reviewed  Elevated WBC Hypoglycemia   P:  GOC discussion  Agree with palliative care consultation   Continue d10  Maybe consider comfort feeding  Agree with DNR  Continue ceftriaxone   Dr. from [facility] has agreed to pick up patient for tomorrow.   [facility], DO [facility] 04/06/2021 5:40 PM

## 2021-04-06 NOTE — Progress Notes (Signed)
PT Cancellation Note  Patient Details Name: Ladavia Lindenbaum MRN: [id number] DOB: August 31, 1929   Cancelled Treatment:    Reason Eval/Treat Not Completed: Patient at procedure or test/unavailable (xray)   Tzippy Testerman B Dovber Ernest 04/06/2021, 12:21 PM  Merryl Hacker, PT Acute Rehabilitation Services Pager: [phone] Office: [phone]

## 2021-04-06 NOTE — Progress Notes (Signed)
Mobility Specialist: Progress Note   04/06/21 1632  Mobility  Activity Ambulated in hall  Level of Assistance Moderate assist, patient does 50-74%  Assistive Device Four wheel walker  Distance Ambulated (ft) 70 ft  Mobility Response Tolerated fair  Mobility performed by Mobility specialist  $Mobility charge 1 Mobility   Pre-Mobility: 85 HR, 160/84 BP, 100% SpO2 Post-Mobility: 90 HR, 170/89 BP, 96% SpO2  Pt audibly SOB during ambulation but otherwise asx. Pt presented with moderate posterior lean during ambulation, requiring verbal cues to keep her chest up and hips pushed in as well as physical assistance. Recommend RW next walk and possible chair follow for safety. Pt back to bed after walk with bed alarm on, RN notified.   [facility] Christina Ortiz Mobility Specialist Mobility Specialist Phone: [phone]

## 2021-04-07 DIAGNOSIS — I501 Left ventricular failure: Secondary | ICD-10-CM | POA: Diagnosis not present

## 2021-04-07 DIAGNOSIS — R0902 Hypoxemia: Secondary | ICD-10-CM

## 2021-04-07 DIAGNOSIS — J9601 Acute respiratory failure with hypoxia: Secondary | ICD-10-CM | POA: Diagnosis not present

## 2021-04-07 LAB — GLUCOSE, CAPILLARY
Glucose-Capillary: 168 mg/dL — ABNORMAL HIGH (ref 70–99)
Glucose-Capillary: 77 mg/dL (ref 70–99)
Glucose-Capillary: 142 mg/dL — ABNORMAL HIGH (ref 70–99)
Glucose-Capillary: 115 mg/dL — ABNORMAL HIGH (ref 70–99)

## 2021-04-07 LAB — BASIC METABOLIC PANEL
Anion gap: 10 (ref 5–15)
BUN: 18 mg/dL (ref 8–23)
CO2: 29 mmol/L (ref 22–32)
Calcium: 9 mg/dL (ref 8.9–10.3)
Chloride: 97 mmol/L — ABNORMAL LOW (ref 98–111)
Creatinine, Ser: 0.96 mg/dL (ref 0.44–1.00)
GFR, Estimated: 57 mL/min — ABNORMAL LOW (ref >60)
Glucose, Bld: 104 mg/dL — ABNORMAL HIGH (ref 70–99)
Potassium: 2.4 mmol/L — CL (ref 3.5–5.1)
Sodium: 136 mmol/L (ref 135–145)

## 2021-04-07 LAB — CBC
HCT: 44.2 % (ref 36.0–46.0)
Hemoglobin: 14.5 g/dL (ref 12.0–15.0)
MCH: 32.3 pg (ref 26.0–34.0)
MCHC: 32.8 g/dL (ref 30.0–36.0)
MCV: 98.4 fL (ref 80.0–100.0)
Platelets: 235 10*3/uL (ref 150–400)
RBC: 4.49 MIL/uL (ref 3.87–5.11)
RDW: 13.2 % (ref 11.5–15.5)
WBC: 10.7 10*3/uL — ABNORMAL HIGH (ref 4.0–10.5)
nRBC: 0 % (ref 0.0–0.2)

## 2021-04-07 LAB — MAGNESIUM: Magnesium: 1.5 mg/dL — ABNORMAL LOW (ref 1.7–2.4)

## 2021-04-07 MED ORDER — POTASSIUM CHLORIDE 10 MEQ/100ML IV SOLN
10.0000 meq | INTRAVENOUS | Status: AC
Start: 2021-04-07 — End: 2021-04-07
  Administered 2021-04-07 (×6): 10 meq via INTRAVENOUS
  Filled 2021-04-07 (×6): qty 100

## 2021-04-07 MED ORDER — LEVOTHYROXINE SODIUM 100 MCG/5ML IV SOLN
37.5000 ug | Freq: Every day | INTRAVENOUS | Status: DC
  Administered 2021-04-07 – 2021-04-09 (×3): 37.5 ug via INTRAVENOUS
  Filled 2021-04-07 (×3): qty 5

## 2021-04-07 MED ORDER — MAGNESIUM SULFATE 50 % IJ SOLN: 4.0000 g | Freq: Once | INTRAVENOUS | Status: DC

## 2021-04-07 MED ORDER — MAGNESIUM SULFATE 50 % IJ SOLN
3.0000 g | Freq: Once | INTRAVENOUS | Status: AC
  Administered 2021-04-07: 3 g via INTRAVENOUS
  Filled 2021-04-07: qty 6

## 2021-04-07 NOTE — Progress Notes (Signed)
  Speech Language Pathology Treatment: Dysphagia  Patient Details Name: Christina Ortiz MRN: [id number] DOB: 1929/07/27 Today's Date: 04/07/2021 Time: 1451-1530 SLP Time Calculation (min) (ACUTE ONLY): 39 min  Assessment / Plan / Recommendation Clinical Impression  Pt was seen for f/u after MBS on previous date, with daughter present today for therapy. Results of MBS were reviewed, including penetration/aspiration that was small in volume yet present with all consistencies. Even penetrates could not be cleared with cues to cough. Pt's daughter describes a decline in cognition since hip fx in January, and says that even if pt were to attempt swallowing therapy, she does not believe that she has the ability to carryover information or strategies. She wants to discuss a feeding tube as a potential option with palliative care. SLP also provided education on the option of comfort feeding with use of precautions and frequent oral care to try to reduce but not eliminate aspiration risk. It would not be unreasonable to try to repeat MBS later this week to see if pt has made any improvements, but would also want to see if she is able to participate in swallowing therapy or show improvements in overall strength in the interim. For now, as GOC decisions are being made, would remain NPO with emphasis on frequent oral care to maximize moisture and reduce oral bacteria.    HPI HPI: Pt is an 87 y.o. female with acute hypoxemic respiratory failure secondary to potential aspiration episode with vomiting. Also presents with sepsis secondary to presumed UTI. CXR 4/11: Worsening patchy airspace disease in the left mid lung and right lower lobe. Esophagram (2019): Significant distal esophageal stricture above a moderate-sized hiatal hernia; Esophageal dysmotility. PMH: GERD, HTN, hiatal hernia, gastritis      SLP Plan  Continue with current plan of care       Recommendations  Diet recommendations: NPO Medication  Administration: Via alternative means                Oral Care Recommendations: Oral care QID Follow up Recommendations:  (tba) SLP Visit Diagnosis: Dysphagia, oropharyngeal phase (R13.12) Plan: Continue with current plan of care       GO                 Mahala Menghini., M.A. CCC-SLP Acute Rehabilitation Services Pager [phone] Office [phone]  04/07/2021, 4:15 PM

## 2021-04-07 NOTE — Evaluation (Signed)
Physical Therapy Evaluation Patient Details Name: Christina Ortiz MRN: [id number] DOB: March 14, 1929 Today's Date: 04/07/2021   History of Present Illness  87 yo admitted 4/8 with AMS from ALF with SOB and aspiration with HTN urgency. PMHx: PAF, CKD, HTN, HLD, hypothyroidism, GERd, dementia, Rt humerus fx 2019, hip fx 1/22  Clinical Impression  Pt sitting up on bed with RN present on arrival receiving 6th run of K+ after critical value this am. Pt with HR 99-115 during session. Pt with hx of dementia and unable to provide PLOF. Contacted daughter who reports pt has been at ALF for a couple months with assist for ADLs and limited gait but working with HHPT and SLP. Pt with decreased cognition, balance, safety and mobility who will benefit from acute therapy to maximize mobility and function to decrease fall risk and burden of care. Pt requires assist for all transfers and will benefit from SNF placement if ALF unable to provide assist for transfers and safety throughout the day.     Follow Up Recommendations SNF;Supervision/Assistance - 24 hour vs ALF pending assist provided at ALF    Equipment Recommendations  Wheelchair (measurements PT)    Recommendations for Other Services       Precautions / Restrictions Precautions Precautions: Fall      Mobility  Bed Mobility               General bed mobility comments: pt sitting up in bed on arrival with RN present. increased time and effort for pt to scoot fully to EOB    Transfers Overall transfer level: Needs assistance   Transfers: Sit to/from Stand;Stand Pivot Transfers Sit to Stand: Min assist Stand pivot transfers: Mod assist       General transfer comment: min assist to stand from bed with assist for anterior translation and rise with max VC for hand placement to pivot to chair with assist for balance. Min assist to rise from [facility] with posteror lean and max cues for hand placement on RW and anterior  translation  Ambulation/Gait Ambulation/Gait assistance: Mod assist Gait Distance (Feet): 15 Feet Assistive device: Rolling walker (2 wheeled) Gait Pattern/deviations: Trunk flexed;Narrow base of support;Shuffle   Gait velocity interpretation: <1.8 ft/sec, indicate of risk for recurrent falls General Gait Details: pt with flexed trunk, RW too anterior, shuffling gait and significant assist needed to initiate stepping, direct RW and assist with balance for limited gait around bed to chair  Stairs            Wheelchair Mobility    Modified Rankin (Stroke Patients Only)       Balance Overall balance assessment: Needs assistance Sitting-balance support: No upper extremity supported;Feet supported Sitting balance-Leahy Scale: Fair Sitting balance - Comments: EOB and BSC without UE assist   Standing balance support: Bilateral upper extremity supported Standing balance-Leahy Scale: Zero Standing balance comment: pt with posterior bias in standing with and without UE support                             Pertinent Vitals/Pain Pain Assessment: No/denies pain    Home Living Family/patient expects to be discharged to:: Assisted living               Home Equipment: Krystal Clark - 4 wheels;Bedside commode;Shower seat;Wheelchair - manual;Cane - single point      Prior Function Level of Independence: Needs assistance   Gait / Transfers Assistance Needed: pt reports limited walking and  using a WC. Daughter states she is unsure how much mobility is being performed  ADL's / Homemaking Assistance Needed: assist of staff for all bathing/dressing. Staff performs all homemaking      Hand Dominance        Extremity/Trunk Assessment   Upper Extremity Assessment Upper Extremity Assessment: Generalized weakness    Lower Extremity Assessment Lower Extremity Assessment: Generalized weakness    Cervical / Trunk Assessment Cervical / Trunk Assessment:  Kyphotic  Communication   Communication: HOH  Cognition Arousal/Alertness: Awake/alert Behavior During Therapy: Flat affect Overall Cognitive Status: Impaired/Different from baseline Area of Impairment: Memory;Orientation;Attention;Safety/judgement                 Orientation Level: Disoriented to;Time;Place;Situation Current Attention Level: Sustained Memory: Decreased short-term memory   Safety/Judgement: Decreased awareness of safety;Decreased awareness of deficits     General Comments: pt incontinent of bowel and bladder on arrival and unaware. Pt not oriented other than to self. Delayed response to all commands and cues      General Comments      Exercises     Assessment/Plan    PT Assessment Patient needs continued PT services  PT Problem List Decreased strength;Decreased mobility;Decreased safety awareness;Decreased range of motion;Decreased activity tolerance;Decreased cognition;Decreased balance;Decreased knowledge of use of DME;Decreased skin integrity       PT Treatment Interventions DME instruction;Gait training;Functional mobility training;Therapeutic activities;Patient/family education;Cognitive remediation;Balance training;Therapeutic exercise    PT Goals (Current goals can be found in the Care Plan section)  Acute Rehab PT Goals PT Goal Formulation: Patient unable to participate in goal setting Time For Goal Achievement: 04/21/21 Potential to Achieve Goals: Fair    Frequency Min 2X/week   Barriers to discharge        Co-evaluation               AM-PAC PT "6 Clicks" Mobility  Outcome Measure Help needed turning from your back to your side while in a flat bed without using bedrails?: A Little Help needed moving from lying on your back to sitting on the side of a flat bed without using bedrails?: A Little Help needed moving to and from a bed to a chair (including a wheelchair)?: A Lot Help needed standing up from a chair using your arms  (e.g., wheelchair or bedside chair)?: A Lot Help needed to walk in hospital room?: A Lot Help needed climbing 3-5 steps with a railing? : Total 6 Click Score: 13    End of Session Equipment Utilized During Treatment: Gait belt Activity Tolerance: Patient tolerated treatment well Patient left: in chair;with call bell/phone within reach;with chair alarm set Nurse Communication: Mobility status;Precautions PT Visit Diagnosis: Other abnormalities of gait and mobility (R26.89);Difficulty in walking, not elsewhere classified (R26.2);Muscle weakness (generalized) (M62.81)    Time: 6063-0160 PT Time Calculation (min) (ACUTE ONLY): 21 min   Charges:   PT Evaluation $PT Eval Moderate Complexity: 1 Mod          Mahogani Holohan P, PT Acute Rehabilitation Services Pager: [phone] Office: [phone]   Enedina Finner Amelia Burgard 04/07/2021, 12:37 PM

## 2021-04-07 NOTE — Progress Notes (Signed)
eLink Physician-Brief Progress Note Patient Name: Christina Ortiz DOB: [birth year] MRN: [id number]   Date of Service  04/07/2021  HPI/Events of Note  K+ 2.4  eICU Interventions  KCL order for 10 meq Q 1 hour x 6 already on the [facility].        Thomasene Lot Dashia Caldeira 04/07/2021, 5:56 AM

## 2021-04-07 NOTE — Progress Notes (Signed)
[facility]                                                                              Patient Demographics  Christina Ortiz, is a 87 y.o. female, DOB - Aug 27, 1929, NBV:[id number]  Admit date - 04/03/2021   Admitting Physician Chi Mechele Collin, MD  Outpatient Primary MD for the patient is No primary care provider on file.  Outpatient specialists:   LOS - 4  days   Medical records reviewed and are as summarized below:    Chief Complaint  Patient presents with  . Shortness of Breath       Brief summary   Patient is a 87 year old female with history of paroxysmal A. fib on Eliquis, CKD, hypertension, hyperlipidemia, hypothyroidism, GERD, mild dementia presented from local ALF with altered mental status, shortness of breath and severe hypertension.  Patient's daughter had reported altered mental status with an episode of vomiting followed by hypoxia later severe hypertension. In ED, patient was hypertensive with BP 203/137, O2 sats appropriate for 4 L [place].  Patient was placed on nitro drip for hypertensive urgency.  However due to worsening mental status, hypoxic and hypotension, nitro drip was discontinued, placed BiPAP and patient was admitted to ICU for further work-up. Creatinine 1.16, BNP 1105, lactic acid 2.4  Significant Hospital Events:   4/8 Admitted with possible aspiration event vs flash pulmonary edema in the setting of hypertensive urgency   4/9 Started on precedex for severe agitation  4/10 Off precedex. Tolerated haldol PRNs.   4/11 back on HFNC   Patient transferred to floor, TRH service assumed care on 4/12   Assessment & Plan    Acute respiratory failure with hypoxia - Multifactorial, precipitated by flash pulmonary edema, aspiration event, with underlying history of diastolic CHF, hypertensive urgency -Off the BiPAP, chest x-ray showed persistent cardiomegaly with left base atelectasis.  -  O2 sats 96- 99% on 2 L O2 via [place], wean as  tolerated -2D echo 4/10 showed EF 60-65%, diastolic function indeterminate.  Mild to moderate mitral valve regurgitation -Continue Lasix 20 mg IV daily, strict I's and O's positive balance of 1.8 L. -Aspiration precautions, patient was placed on IV Rocephin  Hypokalemia, hypomagnesemia -Potassium 2.4, magnesium 1.5 -Aggressively replaced IV  Acute metabolic encephalopathy -Multifactorial in the setting of hypertensive urgency, delirium, infection -Currently alert and oriented, improving -Delirium and aspiration precautions  Dysphagia likely due to aspiration event -SLP evaluation, currently n.p.o. - hopefully with improving mental status, will be able to work with SLP and able to swallow dysphagia diet of some sort -If continues to have dysphagia, will need to discuss cortrack or PEG tube with family  Hypothyroidism -TSH 4.7, free T4 1.1 -Patient on Synthroid 75 MCG daily outpatient -Currently n.p.o., will resume IV Synthroid (outpatient on 75 MCG daily)  History of paroxysmal atrial fibrillation -Rate controlled, NPO -Holding NOAC  Code Status: DNR DVT Prophylaxis:  heparin injection 5,000 Units Start: 04/03/21 1730 SCDs Start: 04/03/21 1723   Level of Care: Level of care: Progressive Family Communication: Discussed all imaging results, lab results, explained to the patient   Disposition Plan:  Status is: Inpatient  Remains inpatient appropriate because:Inpatient level of care appropriate due to severity of illness   Dispo: The patient is from: SNF              Anticipated d/c is to: SNF              Patient currently is not medically stable to d/c.  Currently n.p.o., awaiting SLP evaluation   Difficult to place patient No      Time Spent in minutes 35 minutes  Procedures:  2D echo  Consultants:   Admitted by pulmonology critical care  Antimicrobials:   Anti-infectives (From admission, onward)   Start     Dose/Rate Route Frequency Ordered Stop    04/03/21 1800  cefTRIAXone (ROCEPHIN) 1 g in sodium chloride 0.9 % 100 mL IVPB        1 g 200 mL/hr over 30 Minutes Intravenous Every 24 hours 04/03/21 1756            Medications  Scheduled Meds: . chlorhexidine  15 mL Mouth Rinse BID  . Chlorhexidine Gluconate Cloth  6 each Topical Daily  . docusate  100 mg Per Tube BID  . furosemide  20 mg Intravenous Daily  . heparin  5,000 Units Subcutaneous Q8H  . mouth rinse  15 mL Mouth Rinse q12n4p  . mupirocin ointment  1 application Nasal BID  . polyethylene glycol  17 g Per Tube Daily   Continuous Infusions: . cefTRIAXone (ROCEPHIN)  IV Stopped (04/06/21 1849)  . dextrose 100 mL/hr at 04/07/21 1013  . magnesium sulfate LVP 250-500 ml    . potassium chloride 10 mEq (04/07/21 1114)   PRN Meds:.docusate sodium, haloperidol lactate, hydrALAZINE, ondansetron (ZOFRAN) IV, polyethylene glycol, Resource ThickenUp Clear      Subjective:   Christina Ortiz was seen and examined today.  Much more evaluated, oriented to self, knows location [facility]) and time (2022).  No acute issues overnight.  Afebrile.  Patient denies dizziness, chest pain, abdominal pain, N/V.  No acute events overnight Objective:   Vitals:   04/07/21 0010 04/07/21 0400 04/07/21 0435 04/07/21 0749  BP: (!) 158/96 (!) 190/108 (!) 142/84 125/77  Pulse: 78 85 93 93  Resp: 19 20 15 20   Temp:  97.6 F (36.4 C)  97.6 F (36.4 C)  TempSrc:  Axillary  Oral  SpO2: 99% 98% 98%   Weight:  52.7 kg    Height:        Intake/Output Summary (Last 24 hours) at 04/07/2021 1157 Last data filed at 04/07/2021 1025 Gross per 24 hour  Intake 3115.34 ml  Output 2150 ml  Net 965.34 ml     Wt Readings from Last 3 Encounters:  04/07/21 52.7 kg  01/28/21 61 kg  01/06/21 57.2 kg     Exam  General: Alert and oriented x 2, NAD, ill-appearing  Cardiovascular: S1 S2 auscultated, irregularly irregular  Respiratory: Diminished breath sounds at the bases with scattered  rhonchi  Gastrointestinal: Soft, nontender, nondistended, + bowel sounds  Ext: no pedal edema bilaterally  Neuro: no new deficits  Musculoskeletal: No digital cyanosis, clubbing  Skin: No rashes  Psych: alert and oriented x2   Data Reviewed:  I have personally reviewed following labs and imaging studies  Micro Results Recent Results (from the past 240 hour(s))  Resp Panel by RT-PCR (Flu A&B, Covid) Nasopharyngeal Swab     Status: None   Collection Time: 04/03/21  1:54 PM   Specimen: Nasopharyngeal Swab; Nasopharyngeal(NP) swabs  in vial transport medium  Result Value Ref Range Status   SARS Coronavirus 2 by RT PCR NEGATIVE NEGATIVE Final    Comment: (NOTE) SARS-CoV-2 target nucleic acids are NOT DETECTED.  The SARS-CoV-2 RNA is generally detectable in upper respiratory specimens during the acute phase of infection. The lowest concentration of SARS-CoV-2 viral copies this assay can detect is 138 copies/mL. A negative result does not preclude SARS-Cov-2 infection and should not be used as the sole basis for treatment or other patient management decisions. A negative result may occur with  improper specimen collection/handling, submission of specimen other than nasopharyngeal swab, presence of viral mutation(s) within the areas targeted by this assay, and inadequate number of viral copies(<138 copies/mL). A negative result must be combined with clinical observations, patient history, and epidemiological information. The expected result is Negative.  Fact Sheet for Patients:  BloggerCourse.com  Fact Sheet for Healthcare Providers:  SeriousBroker.it  This test is no t yet approved or cleared by the [place] FDA and  has been authorized for detection and/or diagnosis of SARS-CoV-2 by FDA under an Emergency Use Authorization (EUA). This EUA will remain  in effect (meaning this test can be used) for the duration of  the COVID-19 declaration under Section 564(b)(1) of the Act, 21 U.S.C.section 360bbb-3(b)(1), unless the authorization is terminated  or revoked sooner.       Influenza A by PCR NEGATIVE NEGATIVE Final   Influenza B by PCR NEGATIVE NEGATIVE Final    Comment: (NOTE) The Xpert Xpress SARS-CoV-2/FLU/RSV plus assay is intended as an aid in the diagnosis of influenza from Nasopharyngeal swab specimens and should not be used as a sole basis for treatment. Nasal washings and aspirates are unacceptable for Xpert Xpress SARS-CoV-2/FLU/RSV testing.  Fact Sheet for Patients: BloggerCourse.com  Fact Sheet for Healthcare Providers: SeriousBroker.it  This test is not yet approved or cleared by the [place] FDA and has been authorized for detection and/or diagnosis of SARS-CoV-2 by FDA under an Emergency Use Authorization (EUA). This EUA will remain in effect (meaning this test can be used) for the duration of the COVID-19 declaration under Section 564(b)(1) of the Act, 21 U.S.C. section 360bbb-3(b)(1), unless the authorization is terminated or revoked.  Performed at [facility], [address], [place], [place] [address]   MRSA PCR Screening     Status: Abnormal   Collection Time: 04/03/21  8:12 PM   Specimen: Nasal Mucosa; Nasopharyngeal  Result Value Ref Range Status   MRSA by PCR POSITIVE (A) NEGATIVE Final    Comment:        The GeneXpert MRSA Assay (FDA approved for NASAL specimens only), is one component of a comprehensive MRSA colonization surveillance program. It is not intended to diagnose MRSA infection nor to guide or monitor treatment for MRSA infections. RESULT CALLED TO, READ BACK BY AND VERIFIED WITHChalmers Guest RN [id number] 04/03/21 A BROWNING Performed at [facility], [address] [place], [place] [address]   Culture, blood (routine x 2)     Status: None (Preliminary result)   Collection Time: 04/03/21   8:14 PM   Specimen: BLOOD RIGHT ARM  Result Value Ref Range Status   Specimen Description BLOOD RIGHT ARM  Final   Special Requests   Final    BOTTLES DRAWN AEROBIC ONLY Blood Culture results may not be optimal due to an inadequate volume of blood received in culture bottles   Culture   Final    NO GROWTH 4  DAYS Performed at [facility], [address], [place], [place] [address]    Report Status PENDING  Incomplete  Culture, blood (routine x 2)     Status: None (Preliminary result)   Collection Time: 04/03/21  8:25 PM   Specimen: BLOOD LEFT ARM  Result Value Ref Range Status   Specimen Description BLOOD LEFT ARM  Final   Special Requests   Final    BOTTLES DRAWN AEROBIC AND ANAEROBIC Blood Culture results may not be optimal due to an inadequate volume of blood received in culture bottles   Culture   Final    NO GROWTH 4 DAYS Performed at [facility], [address] [place], [place] [address]    Report Status PENDING  Incomplete  Urine Culture     Status: None   Collection Time: 04/03/21  8:50 PM   Specimen: Urine, Random  Result Value Ref Range Status   Specimen Description URINE, RANDOM  Final   Special Requests NONE  Final   Culture   Final    NO GROWTH Performed at [facility], [address], [place], [place] [address]    Report Status 04/05/2021 FINAL  Final    Radiology Reports CT Head Wo Contrast  Result Date: 04/03/2021 CLINICAL DATA:  Altered mental status EXAM: CT HEAD WITHOUT CONTRAST TECHNIQUE: Contiguous axial images were obtained from the base of the skull through the vertex without intravenous contrast. COMPARISON:  January 24, 2021 FINDINGS: Brain: Moderate diffuse atrophy is stable. There is no intracranial mass, hemorrhage, extra-axial fluid collection, or midline shift. There is decreased attenuation in portions of the centra semiovale bilaterally. There is small vessel disease in the anterior limb of each external capsule. Probable  small lacunar infarcts noted in each inferior centrum semiovale. There is evidence of a prior small infarct in the lateral right cerebellum somewhat posterior in location, stable. No acute infarct appreciable. Vascular: No hyperdense vessels. There is calcification in each carotid siphon region. Skull: The bony calvarium appears intact. Sinuses/Orbits: Paranasal sinuses clear. Orbits appear symmetric bilaterally. Other: Mastoid air cells clear. IMPRESSION: Atrophy with stable supratentorial small vessel disease. 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 No acute appearing infarct is demonstrated on this study. No mass or hemorrhage. There are foci of arterial vascular calcification. Electronically Signed   By: Bretta Bang III M.D.   On: 04/03/2021 16:36   DG CHEST PORT 1 VIEW  Result Date: 04/06/2021 CLINICAL DATA:  Hypoxia EXAM: PORTABLE CHEST 1 VIEW COMPARISON:  04/03/2021 FINDINGS: Cardiomegaly. Worsening airspace disease in the left mid and lower lung. Vascular congestion with right perihilar and infrahilar opacities. Small bilateral effusions. Aortic atherosclerosis. No acute bony abnormality. IMPRESSION: Cardiomegaly, vascular congestion. Worsening patchy airspace disease in the left mid lung and right lower lobe. Findings concerning for pneumonia. Electronically Signed   By: Charlett Nose M.D.   On: 04/06/2021 08:50   DG Chest Port 1 View  Result Date: 04/03/2021 CLINICAL DATA:  Shortness of breath and decreased oxygen saturation EXAM: PORTABLE CHEST 1 VIEW COMPARISON:  July 23, 2020 FINDINGS: There is atelectatic change in the left base region. No edema or airspace opacity. There is again noted generalized cardiac enlargement with pulmonary vascularity normal. No adenopathy. There is aortic atherosclerosis. No bone lesions. IMPRESSION: Persistent cardiomegaly. Left base atelectasis. Lungs elsewhere clear. No adenopathy evident. Aortic Atherosclerosis (ICD10-I70.0).  Electronically Signed   By: Bretta Bang III M.D.   On: 04/03/2021 14:42  DG Swallowing Func-Speech Pathology  Result Date: 04/06/2021 Objective Swallowing Evaluation: Type of Study: MBS-Modified Barium Swallow Study  Patient Details Name: Monzerat Handler MRN: [id number] Date of Birth: [birth year] Today's Date: 04/06/2021 Time: SLP Start Time (ACUTE ONLY): 1215 -SLP Stop Time (ACUTE ONLY): 1236 SLP Time Calculation (min) (ACUTE ONLY): 21 min Past Medical History: Past Medical History: Diagnosis Date . Acute bronchitis 10/2017 . Arthritis  . CKD (chronic kidney disease)   Xarelto dose is 15 mg QD . Coronary artery disease   a. s/p PCI/stenting to RCA and LAD ([facility] [place]) in 2006; b. Nuc 1/15: No ischemia, EF 70% . Depression  . Gallstones  . Gastritis  . GERD (gastroesophageal reflux disease)  . Hiatal hernia  . Hip fracture (HCC)  . History of cholecystectomy  . History of echocardiogram   a. Echo 1/15: Moderate LVH, EF 55-60%, impaired relaxation, mild AS . Hyperlipidemia  . Hypertension  . Hypokalemia  . Hypothyroidism  . Mobitz type 1 second degree atrioventricular block   a. Event Monitor 3/15: NSR, first-degree AV block, second-degree AB block - Mobitz 1, PACs, NSVT (5 beats) . PAF (paroxysmal atrial fibrillation) (HCC)  . Thyroid disease  Past Surgical History: Past Surgical History: Procedure Laterality Date . ANGIOPLASTY  2006  with sent placement . BACK SURGERY    Dr Shon Baton . BILIARY DILATION  10/18/2019  Procedure: BILIARY DILATION;  Surgeon: Rachael Fee, MD;  Location: [facility];  Service: Endoscopy;; . BILIARY DILATION  12/17/2019  Procedure: BILIARY DILATION;  Surgeon: [name], MD;  Location: [facility] ENDOSCOPY;  Service: Endoscopy;; . BILIARY DILATION  02/07/2020  Procedure: BILIARY DILATION;  Surgeon: Lemar Lofty., MD;  Location: [facility] ENDOSCOPY;  Service: Gastroenterology;; . BILIARY DILATION  09/10/2020  Procedure: BILIARY DILATION;  Surgeon: Lemar Lofty., MD;  Location: [facility] ENDOSCOPY;  Service: Gastroenterology;; . BILIARY STENT PLACEMENT N/A 10/18/2019  Procedure: BILIARY STENT PLACEMENT;  Surgeon: Rachael Fee, MD;  Location: [facility] ENDOSCOPY;  Service: Endoscopy;  Laterality: N/A; . BILIARY STENT PLACEMENT N/A 12/17/2019  Procedure: BILIARY STENT PLACEMENT;  Surgeon: [name], MD;  Location: [facility] ENDOSCOPY;  Service: Endoscopy;  Laterality: N/A; . BILIARY STENT PLACEMENT  02/07/2020  Procedure: BILIARY STENT PLACEMENT;  Surgeon: Meridee Score Netty Starring., MD;  Location: [facility] ENDOSCOPY;  Service: Gastroenterology;; . BLEPHAROPLASTY   . BREAST LUMPECTOMY Left  . CARDIAC CATHETERIZATION  2006 . CATARACT EXTRACTION Bilateral  . CHOLECYSTECTOMY   . DILATION AND CURETTAGE OF UTERUS   . ENDOSCOPIC RETROGRADE CHOLANGIOPANCREATOGRAPHY (ERCP) WITH PROPOFOL N/A 12/17/2019  Procedure: ENDOSCOPIC RETROGRADE CHOLANGIOPANCREATOGRAPHY (ERCP) WITH PROPOFOL;  Surgeon: [name], MD;  Location: [facility] ENDOSCOPY;  Service: Endoscopy;  Laterality: N/A; . ENDOSCOPIC RETROGRADE CHOLANGIOPANCREATOGRAPHY (ERCP) WITH PROPOFOL N/A 02/07/2020  Procedure: ENDOSCOPIC RETROGRADE CHOLANGIOPANCREATOGRAPHY (ERCP) WITH PROPOFOL;  Surgeon: Meridee Score Netty Starring., MD;  Location: [facility] ENDOSCOPY;  Service: Gastroenterology;  Laterality: N/A; . ENDOSCOPIC RETROGRADE CHOLANGIOPANCREATOGRAPHY (ERCP) WITH PROPOFOL N/A 09/10/2020  Procedure: ENDOSCOPIC RETROGRADE CHOLANGIOPANCREATOGRAPHY (ERCP) WITH PROPOFOL;  Surgeon: Meridee Score Netty Starring., MD;  Location: [facility] ENDOSCOPY;  Service: Gastroenterology;  Laterality: N/A; . ERCP N/A 10/18/2019  Procedure: ENDOSCOPIC RETROGRADE CHOLANGIOPANCREATOGRAPHY (ERCP);  Surgeon: Rachael Fee, MD;  Location: [facility] ENDOSCOPY;  Service: Endoscopy;  Laterality: N/A; . ESOPHAGOGASTRODUODENOSCOPY (EGD) WITH PROPOFOL N/A 12/17/2019  Procedure: ESOPHAGOGASTRODUODENOSCOPY (EGD) WITH PROPOFOL;  Surgeon: [name], MD;  Location: [facility] ENDOSCOPY;  Service: Endoscopy;   Laterality: N/A; . INTRAMEDULLARY (IM) NAIL INTERTROCHANTERIC Right 01/25/2021  Procedure: INTRAMEDULLARY (IM) NAIL INTERTROCHANTRIC;  Surgeon: Charlann Boxer,  Molli HazardMatthew, MD;  [facility];  Service: Orthopedics;  Laterality: Right; . KYPHOPLASTY N/A 01/03/2020  Procedure: KYPHOPLASTY LUMBAR TWO;  Surgeon: Venita LickBrooks, Dahari, MD;  [facility];  Service: Orthopedics;  Laterality: N/A; . REMOVAL OF STONES  10/18/2019  Procedure: REMOVAL OF STONES;  Surgeon: Rachael FeeJacobs, Daniel P, MD;  Location: [facility] ENDOSCOPY;  Service: Endoscopy;; . REMOVAL OF STONES  12/17/2019  Procedure: REMOVAL OF STONES;  Surgeon: Meryl DareStark, Malcolm T, MD;  Location: [facility] ENDOSCOPY;  Service: Endoscopy;; . REMOVAL OF STONES  02/07/2020  Procedure: REMOVAL OF STONES;  Surgeon: Lemar LoftyMansouraty, Gabriel Jr., MD;  [facility];  Service: Gastroenterology;; . REMOVAL OF STONES  09/10/2020  Procedure: REMOVAL OF STONES;  Surgeon: Lemar LoftyMansouraty, Gabriel Jr., MD;  [facility];  Service: Gastroenterology;; . Gaspar BiddingSAVORY DILATION N/A 12/17/2019  Procedure: Gaspar BiddingSAVORY DILATION;  Surgeon: Meryl DareStark, Malcolm T, MD;  Location: [facility] ENDOSCOPY;  Service: Endoscopy;  Laterality: N/A; . SPHINCTEROTOMY  10/18/2019  Procedure: SPHINCTEROTOMY;  Surgeon: Rachael FeeJacobs, Daniel P, MD;  [facility];  Service: Endoscopy;; . Dennison MascotSPHINCTEROTOMY  12/17/2019  Procedure: Dennison MascotSPHINCTEROTOMY;  Surgeon: Meryl DareStark, Malcolm T, MD;  Location: [facility] ENDOSCOPY;  Service: Endoscopy;; . Burman FreestoneSPYGLASS CHOLANGIOSCOPY N/A 02/07/2020  Procedure: ZOXWRUEASPYGLASS CHOLANGIOSCOPY;  Surgeon: Lemar LoftyMansouraty, Gabriel Jr., MD;  [facility];  Service: Gastroenterology;  Laterality: N/A; Burman Freestone. SPYGLASS LITHOTRIPSY N/A 02/07/2020  Procedure: VWUJWJXBSPYGLASS LITHOTRIPSY;  Surgeon: Lemar LoftyMansouraty, Gabriel Jr., MD;  [facility];  Service: Gastroenterology;  Laterality: N/A; . STENT REMOVAL  12/17/2019  Procedure: STENT REMOVAL;  Surgeon: Meryl DareStark, Malcolm T, MD;  [facility];  Service: Endoscopy;; . STENT REMOVAL  02/07/2020  Procedure: STENT  REMOVAL;  Surgeon: Lemar LoftyMansouraty, Gabriel Jr., MD;  [facility];  Service: Gastroenterology;; . Francine GravenSTENT REMOVAL  09/10/2020  Procedure: STENT REMOVAL;  Surgeon: Lemar LoftyMansouraty, Gabriel Jr., MD;  [facility];  Service: Gastroenterology;; HPI: Pt is an 87 y.o. female with acute hypoxemic respiratory failure secondary to potential aspiration episode with vomiting. Also presents with sepsis secondary to presumed UTI. CXR 4/11: Worsening patchy airspace disease in the left mid lung and right lower lobe. Esophagram (2019): Significant distal esophageal stricture above a moderate-sized hiatal hernia; Esophageal dysmotility. PMH: GERD, HTN, hiatal hernia, gastritis  Subjective: Pt remains pleasant with some confusion, requires cueing Assessment / Plan / Recommendation CHL IP CLINICAL IMPRESSIONS 04/06/2021 Clinical Impression Pt presents with an oropharyngeal dysphagia in which her oral phase is consistently characterized by reduced/delayed posterior propulsion, lingual residue, weak lingual manipulation, lingual pumping and decreased bolus cohesion that led to reduced oral control of boluses. Anterior spillage was also noted with thin liquids via the cup but this improved with trials of nectar and honey thick liquids. When given more solid textures, it appeared that the pt barely chewed the boluses before propelling them back even with verbal cues to chew thoroughly. Pt consistently demonstrated reduced airway protection in which timing and airway closure were not appropriately coordinated during her pharyngeal phase. This combined with her decreased oral control of boluses allowed for boluses to spill into her airway during her swallow resulting in silent aspiration (PAS 8) of all liquids and penetration across consistencies. Vallecular residue was also noted due to reduced base of tongue retraction which also resulted in aspiration of boluses after her initial swallow. She was unable to clear aspirates and  penetrates even when given visual cues to cough. Although aspiration was not observed with solids, consistent penetration even to the vocal folds, as well as her inability to clear penetrates, is concerning for potential aspiration  across meals. Given her cognition, SLP was unable to attempt compensatory strategies to examine if this could help improve her airway protection. Recommend that the pt be NPO with f/u for potential to participate in swallowing therapy, although baseline mentation and swallowing function is unknown to accurately assess prognosis. SLP discussed the results of this study with her NP who also agrees with this recommendation and plans to f/u further with pt/family.   SLP Visit Diagnosis Dysphagia, oropharyngeal phase (R13.12) Attention and concentration deficit following -- Frontal lobe and executive function deficit following -- Impact on safety and function Moderate aspiration risk   CHL IP TREATMENT RECOMMENDATION 04/06/2021 Treatment Recommendations Therapy as outlined in treatment plan below   Prognosis 04/06/2021 Prognosis for Safe Diet Advancement Fair Barriers to Reach Goals Cognitive deficits Barriers/Prognosis Comment -- CHL IP DIET RECOMMENDATION 04/06/2021 SLP Diet Recommendations NPO Liquid Administration via -- Medication Administration Via alternative means Compensations Slow rate;Small sips/bites;Minimize environmental distractions Postural Changes --   CHL IP OTHER RECOMMENDATIONS 04/06/2021 Recommended Consults -- Oral Care Recommendations Oral care QID Other Recommendations Order thickener from pharmacy;Prohibited food (jello, ice cream, thin soups);Remove water pitcher   CHL IP FOLLOW UP RECOMMENDATIONS 04/06/2021 Follow up Recommendations (No Data)   CHL IP FREQUENCY AND DURATION 04/06/2021 Speech Therapy Frequency (ACUTE ONLY) min 2x/week Treatment Duration 2 weeks      CHL IP ORAL PHASE 04/06/2021 Oral Phase Impaired Oral - Pudding Teaspoon -- Oral - Pudding Cup -- Oral - Honey  Teaspoon Reduced posterior propulsion;Weak lingual manipulation;Lingual pumping Oral - Honey Cup Reduced posterior propulsion;Weak lingual manipulation;Lingual pumping Oral - Nectar Teaspoon -- Oral - Nectar Cup Lingual/palatal residue;Reduced posterior propulsion;Decreased bolus cohesion;Weak lingual manipulation;Lingual pumping Oral - Nectar Straw Lingual/palatal residue;Reduced posterior propulsion;Decreased bolus cohesion;Weak lingual manipulation;Lingual pumping Oral - Thin Teaspoon -- Oral - Thin Cup Lingual/palatal residue;Decreased bolus cohesion;Left anterior bolus loss;Right anterior bolus loss;Premature spillage Oral - Thin Straw Lingual/palatal residue;Decreased bolus cohesion;Premature spillage Oral - Puree Reduced posterior propulsion;Weak lingual manipulation;Delayed oral transit Oral - Mech Soft Decreased bolus cohesion;Impaired mastication;Reduced posterior propulsion;Weak lingual manipulation;Delayed oral transit Oral - Regular -- Oral - Multi-Consistency -- Oral - Pill -- Oral Phase - Comment --  CHL IP PHARYNGEAL PHASE 04/06/2021 Pharyngeal Phase Impaired Pharyngeal- Pudding Teaspoon -- Pharyngeal -- Pharyngeal- Pudding Cup -- Pharyngeal -- Pharyngeal- Honey Teaspoon Penetration/Aspiration during swallow;Reduced airway/laryngeal closure;Delayed swallow initiation-vallecula;Reduced tongue base retraction;Pharyngeal residue - valleculae Pharyngeal Material enters airway, passes BELOW cords without attempt by patient to eject out (silent aspiration) Pharyngeal- Honey Cup Penetration/Aspiration during swallow;Reduced airway/laryngeal closure;Delayed swallow initiation-vallecula;Reduced tongue base retraction;Pharyngeal residue - valleculae;Penetration/Apiration after swallow Pharyngeal Material enters airway, passes BELOW cords without attempt by patient to eject out (silent aspiration) Pharyngeal- Nectar Teaspoon -- Pharyngeal -- Pharyngeal- Nectar Cup Penetration/Aspiration during swallow;Reduced  airway/laryngeal closure;Pharyngeal residue - valleculae;Reduced tongue base retraction;Delayed swallow initiation-vallecula;Penetration/Apiration after swallow Pharyngeal Material enters airway, passes BELOW cords without attempt by patient to eject out (silent aspiration) Pharyngeal- Nectar Straw Penetration/Aspiration during swallow;Reduced airway/laryngeal closure;Delayed swallow initiation-vallecula;Penetration/Apiration after swallow;Reduced tongue base retraction Pharyngeal Material enters airway, passes BELOW cords without attempt by patient to eject out (silent aspiration) Pharyngeal- Thin Teaspoon -- Pharyngeal -- Pharyngeal- Thin Cup Penetration/Aspiration during swallow;Pharyngeal residue - valleculae;Reduced airway/laryngeal closure;Reduced tongue base retraction Pharyngeal Material enters airway, passes BELOW cords without attempt by patient to eject out (silent aspiration) Pharyngeal- Thin Straw Penetration/Aspiration during swallow;Pharyngeal residue - valleculae;Reduced airway/laryngeal closure;Reduced tongue base retraction;Penetration/Apiration after swallow Pharyngeal Material enters airway, passes BELOW cords without attempt by patient to eject out (silent aspiration) Pharyngeal-  Puree Penetration/Aspiration during swallow;Reduced airway/laryngeal closure;Penetration/Apiration after swallow Pharyngeal Material enters airway, CONTACTS cords and not ejected out Pharyngeal- Mechanical Soft Penetration/Aspiration during swallow;Reduced airway/laryngeal closure;Penetration/Apiration after swallow Pharyngeal Material enters airway, remains ABOVE vocal cords and not ejected out Pharyngeal- Regular -- Pharyngeal -- Pharyngeal- Multi-consistency -- Pharyngeal -- Pharyngeal- Pill -- Pharyngeal -- Pharyngeal Comment --  CHL IP CERVICAL ESOPHAGEAL PHASE 04/06/2021 Cervical Esophageal Phase WFL Pudding Teaspoon -- Pudding Cup -- Honey Teaspoon -- Honey Cup -- Nectar Teaspoon -- Nectar Cup -- Nectar Straw --  Thin Teaspoon -- Thin Cup -- Thin Straw -- Puree -- Mechanical Soft -- Regular -- Multi-consistency -- Pill -- Cervical Esophageal Comment -- Note populated for Mamie Levers, Student SLP Mahala Menghini., M.A. CCC-SLP Acute Rehabilitation Services Pager [phone] Office [phone] 04/06/2021, 3:49 PM              ECHOCARDIOGRAM COMPLETE  Result Date: 04/05/2021    ECHOCARDIOGRAM REPORT   Patient Name:   Gretta Laverdiere Date of Exam: 04/05/2021 Medical Rec #:  [id number]        Height:       62.0 in Accession #:    [id number]       Weight:       121.3 lb Date of Birth:  08/17/1929        BSA:          1.545 m Patient Age:    89 years         BP:           155/94 mmHg Patient Gender: F                HR:           87 bpm. Exam Location:  Inpatient Procedure: 2D Echo Indications:    atrial fibrillation  History:        Patient has prior history of Echocardiogram examinations, most                 recent 08/22/2019. CAD, chronic kidney disease, Arrythmias:Atrial                 Fibrillation, Signs/Symptoms:Dyspnea; Risk Factors:Hypertension,                 Dyslipidemia and Former Smoker.  Sonographer:    Delcie Roch Referring Phys: [phone] WHITNEY F DAVIS IMPRESSIONS  1. Left ventricular ejection fraction, by estimation, is 60 to 65%. The left ventricle has normal function. The left ventricle has no regional wall motion abnormalities. There is moderate concentric left ventricular hypertrophy. Diastolic function indeterminant due to Afib.  2. Right ventricular systolic function is normal. The right ventricular size is normal. There is moderately elevated pulmonary artery systolic pressure. The estimated right ventricular systolic pressure is 47.0 mmHg.  3. Left atrial size was massively dilated.  4. Right atrial size was severely dilated.  5. 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 There is mild bowing of the mitral valve leaflets into the LA during systole without frank  prolapse.  6. Tricuspid valve regurgitation is mild to moderate.  7. The aortic valve is tricuspid. There is mild calcification of the aortic valve. There is moderate thickening of the aortic valve. Aortic valve regurgitation is trivial. Mild to moderate aortic valve sclerosis/calcification is present, without any evidence of aortic stenosis.  8. The inferior vena cava is normal in size with greater than 50% respiratory variability, suggesting right atrial pressure of 3 mmHg. Comparison(s): The prior echo  in 2020 noted a dilated aortic root and ascending aorta which are not seen on current study however the ascending aorta is not visualized in it's entirety. Otherwise, there is no significant change. FINDINGS  Left Ventricle: Left ventricular ejection fraction, by estimation, is 60 to 65%. The left ventricle has normal function. The left ventricle has no regional wall motion abnormalities. The left ventricular internal cavity size was normal in size. There is  moderate concentric left ventricular hypertrophy. Diastolic function indeterminant due to Afib. Right Ventricle: The right ventricular size is normal. No increase in right ventricular wall thickness. Right ventricular systolic function is normal. There is moderately elevated pulmonary artery systolic pressure. The tricuspid regurgitant velocity is 3.24 m/s, and with an assumed right atrial pressure of 5 mmHg, the estimated right ventricular systolic pressure is 47.0 mmHg. Left Atrium: Left atrial size was massively dilated. Right Atrium: Right atrial size was severely dilated. Pericardium: There is no evidence of pericardial effusion. Mitral Valve: The mitral valve is myxomatous. There is mild thickening of the mitral valve leaflet(s). There is mild calcification of the mitral valve leaflet(s). Mild to moderate mitral annular calcification. Mild to moderate mitral valve regurgitation. Tricuspid Valve: The tricuspid valve is normal in structure. Tricuspid valve  regurgitation is mild to moderate. Aortic Valve: The aortic valve is tricuspid. There is mild calcification of the aortic valve. There is moderate thickening of the aortic valve. Aortic valve regurgitation is trivial. Mild to moderate aortic valve sclerosis/calcification is present, without any evidence of aortic stenosis. Pulmonic Valve: The pulmonic valve was normal in structure. Pulmonic valve regurgitation is mild. Aorta: The aortic root and ascending aorta are structurally normal, with no evidence of dilitation. Venous: The inferior vena cava is normal in size with greater than 50% respiratory variability, suggesting right atrial pressure of 3 mmHg. IAS/Shunts: No atrial level shunt detected by color flow Doppler.  LEFT VENTRICLE PLAX 2D LVIDd:         3.60 cm LVIDs:         2.00 cm LV PW:         1.30 cm LV IVS:        1.40 cm LVOT diam:     1.80 cm LVOT Area:     2.54 cm  RIGHT VENTRICLE             IVC RV S prime:     10.80 cm/s  IVC diam: 1.90 cm TAPSE (M-mode): 1.6 cm LEFT ATRIUM              Index       RIGHT ATRIUM           Index LA diam:        5.30 cm  3.43 cm/m  RA Area:     29.00 cm LA Vol (A2C):   104.0 ml 67.30 ml/m RA Volume:   87.50 ml  56.62 ml/m LA Vol (A4C):   147.0 ml 95.12 ml/m LA Biplane Vol: 133.0 ml 86.06 ml/m   AORTA Ao Root diam: 2.60 cm Ao Asc diam:  2.90 cm MR Peak grad: 125.9 mmHg  TRICUSPID VALVE MR Mean grad: 72.0 mmHg   TV Peak grad:   33.2 mmHg MR Vmax:      561.00 cm/s TV Vmax:        2.88 m/s MR Vmean:     384.0 cm/s  TR Peak grad:   42.0 mmHg  TR Vmax:        324.00 cm/s                            SHUNTS                           Systemic Diam: 1.80 cm Laurance Flatten MD Electronically signed by Laurance Flatten MD Signature Date/Time: 04/05/2021/6:57:29 PM    Final     Lab Data:  CBC: Recent Labs  Lab 04/03/21 1352 04/03/21 1447 04/03/21 2014 04/03/21 2234 04/04/21 0006 04/04/21 9562 04/04/21 2356 04/06/21 0203 04/07/21 0327   WBC 11.0*  --  18.3*  --  16.2*  --  16.9* 13.5* 10.7*  NEUTROABS 8.8*  --   --   --   --   --   --   --   --   HGB 15.0   < > 15.8*   < > 14.7 14.6 12.0 14.3 14.5  HCT 47.3*   < > 48.0*   < > 44.8 43.0 38.1 43.1 44.2  MCV 102.6*  --  100.4*  --  101.8*  --  103.3* 99.8 98.4  PLT 252  --  230  --  211  --  200 193 235   < > = values in this interval not displayed.   Basic Metabolic Panel: Recent Labs  Lab 04/03/21 1352 04/03/21 1447 04/03/21 2014 04/03/21 2234 04/04/21 0006 04/04/21 0812 04/04/21 2356 04/06/21 0203 04/07/21 0327  NA 141   < >  --    < > 140 144 143 142 136  K 4.7   < >  --    < > 5.0 4.0 4.0 3.7 2.4*  CL 105  --   --   --  105  --  110 106 97*  CO2 25  --   --   --  21*  --  24 24 29   GLUCOSE 93  --   --   --  73  --  87 97 104*  BUN 29*  --   --   --  37*  --  48* 38* 18  CREATININE 1.16*  --  1.51*  --  1.39*  --  1.84* 1.27* 0.96  CALCIUM 9.4  --   --   --  9.0  --  8.5* 9.0 9.0  MG  --   --   --   --  2.4  --   --   --  1.5*  PHOS  --   --   --   --  4.8*  --   --   --   --    < > = values in this interval not displayed.   GFR: Estimated Creatinine Clearance: 31.4 mL/min (by C-G formula based on SCr of 0.96 mg/dL). Liver Function Tests: Recent Labs  Lab 04/03/21 1352  AST 29  ALT 10  ALKPHOS 130*  BILITOT 1.4*  PROT 7.4  ALBUMIN 3.3*   No results for input(s): LIPASE, AMYLASE in the last 168 hours. No results for input(s): AMMONIA in the last 168 hours. Coagulation Profile: No results for input(s): INR, PROTIME in the last 168 hours. Cardiac Enzymes: No results for input(s): CKTOTAL, CKMB, CKMBINDEX, TROPONINI in the last 168 hours. BNP (last 3 results) No results for input(s): PROBNP in the last 8760 hours. HbA1C: No results for input(s): HGBA1C in the last 72 hours.  CBG: Recent Labs  Lab 04/06/21 1959 04/06/21 2030 04/06/21 2309 04/07/21 0415 04/07/21 1116  GLUCAP 55* 192* 149* 168* 77   Lipid Profile: No results for input(s):  CHOL, HDL, LDLCALC, TRIG, CHOLHDL, LDLDIRECT in the last 72 hours. Thyroid Function Tests: Recent Labs    04/06/21 1004  TSH 4.730*  FREET4 1.15*   Anemia Panel: No results for input(s): VITAMINB12, FOLATE, FERRITIN, TIBC, IRON, RETICCTPCT in the last 72 hours. Urine analysis:    Component Value Date/Time   COLORURINE YELLOW 04/03/2021 1630   APPEARANCEUR HAZY (A) 04/03/2021 1630   LABSPEC >1.030 (H) 04/03/2021 1630   PHURINE 5.0 04/03/2021 1630   GLUCOSEU NEGATIVE 04/03/2021 1630   HGBUR SMALL (A) 04/03/2021 1630   BILIRUBINUR SMALL (A) 04/03/2021 1630   BILIRUBINUR negative 08/02/2020 1543   BILIRUBINUR negative 09/27/2019 1041   KETONESUR NEGATIVE 04/03/2021 1630   PROTEINUR 100 (A) 04/03/2021 1630   UROBILINOGEN 0.2 08/02/2020 1543   UROBILINOGEN 1.0 04/12/2017 1157   NITRITE NEGATIVE 04/03/2021 1630   LEUKOCYTESUR NEGATIVE 04/03/2021 1630     Jermany Rimel M.D. [facility] 04/07/2021, 11:57 AM  Available via Epic secure chat 7am-7pm After 7 pm, please refer to night coverage provider listed on amion.

## 2021-04-08 DIAGNOSIS — Z66 Do not resuscitate: Secondary | ICD-10-CM

## 2021-04-08 DIAGNOSIS — J9601 Acute respiratory failure with hypoxia: Secondary | ICD-10-CM

## 2021-04-08 DIAGNOSIS — I4891 Unspecified atrial fibrillation: Secondary | ICD-10-CM

## 2021-04-08 DIAGNOSIS — Z515 Encounter for palliative care: Secondary | ICD-10-CM | POA: Diagnosis not present

## 2021-04-08 DIAGNOSIS — Z7189 Other specified counseling: Secondary | ICD-10-CM | POA: Diagnosis not present

## 2021-04-08 DIAGNOSIS — F0391 Unspecified dementia with behavioral disturbance: Secondary | ICD-10-CM

## 2021-04-08 DIAGNOSIS — G9341 Metabolic encephalopathy: Secondary | ICD-10-CM

## 2021-04-08 DIAGNOSIS — R1310 Dysphagia, unspecified: Secondary | ICD-10-CM

## 2021-04-08 LAB — BASIC METABOLIC PANEL
Anion gap: 11 (ref 5–15)
Anion gap: 13 (ref 5–15)
BUN: 12 mg/dL (ref 8–23)
BUN: 10 mg/dL (ref 8–23)
CO2: 28 mmol/L (ref 22–32)
CO2: 26 mmol/L (ref 22–32)
Calcium: 9 mg/dL (ref 8.9–10.3)
Calcium: 8.8 mg/dL — ABNORMAL LOW (ref 8.9–10.3)
Chloride: 94 mmol/L — ABNORMAL LOW (ref 98–111)
Chloride: 93 mmol/L — ABNORMAL LOW (ref 98–111)
Creatinine, Ser: 0.86 mg/dL (ref 0.44–1.00)
Creatinine, Ser: 0.88 mg/dL (ref 0.44–1.00)
GFR, Estimated: >60 mL/min (ref >60)
GFR, Estimated: >60 mL/min (ref >60)
Glucose, Bld: 82 mg/dL (ref 70–99)
Glucose, Bld: 97 mg/dL (ref 70–99)
Potassium: 2.7 mmol/L — CL (ref 3.5–5.1)
Potassium: 3.2 mmol/L — ABNORMAL LOW (ref 3.5–5.1)
Sodium: 133 mmol/L — ABNORMAL LOW (ref 135–145)
Sodium: 132 mmol/L — ABNORMAL LOW (ref 135–145)

## 2021-04-08 LAB — GLUCOSE, CAPILLARY
Glucose-Capillary: 123 mg/dL — ABNORMAL HIGH (ref 70–99)
Glucose-Capillary: 160 mg/dL — ABNORMAL HIGH (ref 70–99)
Glucose-Capillary: 17 mg/dL — CL (ref 70–99)
Glucose-Capillary: 200 mg/dL — ABNORMAL HIGH (ref 70–99)
Glucose-Capillary: 45 mg/dL — ABNORMAL LOW (ref 70–99)
Glucose-Capillary: 51 mg/dL — ABNORMAL LOW (ref 70–99)
Glucose-Capillary: 52 mg/dL — ABNORMAL LOW (ref 70–99)
Glucose-Capillary: 66 mg/dL — ABNORMAL LOW (ref 70–99)
Glucose-Capillary: 70 mg/dL (ref 70–99)
Glucose-Capillary: 90 mg/dL (ref 70–99)

## 2021-04-08 LAB — CBC
HCT: 46.1 % — ABNORMAL HIGH (ref 36.0–46.0)
Hemoglobin: 15.3 g/dL — ABNORMAL HIGH (ref 12.0–15.0)
MCH: 32.6 pg (ref 26.0–34.0)
MCHC: 33.2 g/dL (ref 30.0–36.0)
MCV: 98.3 fL (ref 80.0–100.0)
Platelets: 252 10*3/uL (ref 150–400)
RBC: 4.69 MIL/uL (ref 3.87–5.11)
RDW: 13.2 % (ref 11.5–15.5)
WBC: 9.9 10*3/uL (ref 4.0–10.5)
nRBC: 0 % (ref 0.0–0.2)

## 2021-04-08 LAB — CULTURE, BLOOD (ROUTINE X 2)
Culture: NO GROWTH
Culture: NO GROWTH

## 2021-04-08 LAB — MAGNESIUM: Magnesium: 2.2 mg/dL (ref 1.7–2.4)

## 2021-04-08 LAB — HEPARIN LEVEL (UNFRACTIONATED): Heparin Unfractionated: 0.19 IU/mL — ABNORMAL LOW (ref 0.30–0.70)

## 2021-04-08 MED ORDER — HEPARIN (PORCINE) 25000 UT/250ML-% IV SOLN
950.0000 [IU]/h | INTRAVENOUS | Status: DC
  Administered 2021-04-08: 800 [IU]/h via INTRAVENOUS
  Filled 2021-04-08: qty 250

## 2021-04-08 MED ORDER — POTASSIUM CHLORIDE 10 MEQ/100ML IV SOLN
10.0000 meq | INTRAVENOUS | Status: DC
  Administered 2021-04-08: 10 meq via INTRAVENOUS
  Filled 2021-04-08 (×2): qty 100

## 2021-04-08 MED ORDER — POTASSIUM CHLORIDE 10 MEQ/100ML IV SOLN
10.0000 meq | INTRAVENOUS | Status: AC
  Administered 2021-04-08 (×5): 10 meq via INTRAVENOUS
  Filled 2021-04-08 (×3): qty 100

## 2021-04-08 MED ORDER — DEXTROSE 50 % IV SOLN
INTRAVENOUS | Status: AC
  Administered 2021-04-08: 50 mL
  Administered 2021-04-08: 50 mL via INTRAMUSCULAR
  Filled 2021-04-08: qty 50

## 2021-04-08 MED ORDER — DEXTROSE 50 % IV SOLN
INTRAVENOUS | Status: AC
  Filled 2021-04-08: qty 50

## 2021-04-08 MED ORDER — GLUCAGON HCL RDNA (DIAGNOSTIC) 1 MG IJ SOLR
INTRAMUSCULAR | Status: AC
  Filled 2021-04-08: qty 1

## 2021-04-08 MED ORDER — POTASSIUM CHLORIDE 20 MEQ PO PACK
40.0000 meq | PACK | Freq: Once | ORAL | Status: AC
  Administered 2021-04-08: 40 meq via ORAL
  Filled 2021-04-08: qty 2

## 2021-04-08 NOTE — Progress Notes (Signed)
ANTICOAGULATION CONSULT NOTE  Pharmacy Consult for Heparin Indication: atrial fibrillation  No Known Allergies  Patient Measurements: Height: 5\' 2"  (157.5 cm) Weight: 54.7 kg (120 lb 9.5 oz) IBW/kg (Calculated) : 50.1 Heparin Dosing Weight: 54.7 kg  Vital Signs: Temp: 97.9 F (36.6 C) (04/13 1531) Temp Source: Oral (04/13 1531) BP: 139/90 (04/13 1140) Pulse Rate: 89 (04/13 1531)  Labs: Recent Labs    04/06/21 0203 04/07/21 0327 04/08/21 0405 04/08/21 1731  HGB 14.3 14.5 15.3*  --   HCT 43.1 44.2 46.1*  --   PLT 193 235 252  --   HEPARINUNFRC  --   --   --  0.19*  CREATININE 1.27* 0.96 0.86 0.88    Estimated Creatinine Clearance: 34.3 mL/min (by C-G formula based on SCr of 0.88 mg/dL).   Assessment: Christina Ortiz is an 87 year old female with history of paroxysmal A. fib on Xarelto 15 mg PO daily prior to admission. Patient has been NPO since admission with concern for aspiration and dysphagia. Patient has been getting prophylactic heparin subQ dosing up to this point. Pharmacy now consulted to start therapeutic heparin while Xarelto is on hold.   Heparin level is sub-therapeutic at 0.19 units/mL.  No issue with heparin infusion nor bleeding per RN.    Goal of Therapy:  Heparin level 0.3-0.7 units/ml Monitor platelets by anticoagulation protocol: Yes   Plan:  Increase heparin infusion to 950 units/hr F/U AM labs  Christina Ortiz D. 89, PharmD, BCPS, BCCCP 04/08/2021, 7:10 PM

## 2021-04-08 NOTE — Care Management Important Message (Signed)
Important Message  Patient Details  Name: Christina Ortiz MRN: [id number] Date of Birth: 1929/10/25   Medicare Important Message Given:  Yes     Renie Ora 04/08/2021, 10:13 AM

## 2021-04-08 NOTE — Progress Notes (Signed)
Progress Note    Christina Ortiz   ZHY:[id number]  DOB: Dec 01, 1929  DOA: 04/03/2021     5  PCP: No primary care provider on file.  CC: SOB  Hospital Course: Patient is a 87 year old female with history of paroxysmal A. fib on Eliquis, CKD, hypertension, hyperlipidemia, hypothyroidism, GERD, mild dementia presented from local ALF with altered mental status, shortness of breath and severe hypertension.  Patient's daughter had reported altered mental status with an episode of vomiting followed by hypoxia later severe hypertension. In ED, patient was hypertensive with BP 203/137, O2 sats appropriate for 4 L [place].  Patient was placed on nitro drip for hypertensive urgency.  However due to worsening mental status, hypoxic and hypotension, nitro drip was discontinued, placed BiPAP and patient was admitted to ICU for further work-up.  Interval History:  Palliative care has been consulted and there is a family meeting planned for today for GOC.  Patient seen in her room this morning.  She was frustrated over not getting to eat and did endorse feeling hungry however remains n.p.o. due to unsafe swallowing per SLP evaluation.  ROS: Constitutional: negative for chills and fevers, Respiratory: negative for cough, Cardiovascular: negative for chest pain and Gastrointestinal: negative for abdominal pain  Assessment & Plan: Acute respiratory failure with hypoxia - Multifactorial, precipitated by flash pulmonary edema, aspiration event, with underlying history of diastolic CHF, hypertensive urgency -Off the BiPAP, chest x-ray showed persistent cardiomegaly with left base atelectasis.  - O2 sats 96- 99% on 2 L O2 via [place], wean as tolerated -2D echo 4/10 showed EF 60-65%, diastolic function indeterminate.  Mild to moderate mitral valve regurgitation -Continue Lasix 20 mg IV daily, strict I's and O's positive balance of 1.8 L -Aspiration precautions, patient was placed on IV Rocephin  Hypokalemia,  hypomagnesemia -Aggressively replaced IV  Acute metabolic encephalopathy -Multifactorial in the setting of hypertensive urgency, delirium, infection -Currently alert and oriented, improving -Delirium and aspiration precautions  Dysphagia likely due to aspiration event -SLP evaluation, currently n.p.o. - hopefully with improving mental status, will be able to work with SLP and able to swallow dysphagia diet of some sort -If continues to have dysphagia, will need to discuss cortrack or PEG tube with family - follow up GOC discussions with PCM  Hypothyroidism -TSH 4.7, free T4 1.1 -Patient on Synthroid 75 MCG daily outpatient -Currently n.p.o., will resume IV Synthroid (outpatient on 75 MCG daily)  History of paroxysmal atrial fibrillation -Rate controlled, NPO -Holding NOAC - start heparin per pharmacy until swallow issue settles out   Old records reviewed in assessment of this patient   DVT prophylaxis: SCDs Start: 04/03/21 1723 Heparin drip: 04/08/21  Code Status:   Code Status: DNR Family Communication:   Disposition Plan: Status is: Inpatient  Remains inpatient appropriate because:Inpatient level of care appropriate due to severity of illness   Dispo: The patient is from: Home              Anticipated d/c is to: pending clinical course              Patient currently is not medically stable to d/c.   Difficult to place patient No  Risk of unplanned readmission score: Unplanned Admission- Pilot do not use: 19.24   Objective: Blood pressure 139/90, pulse 82, temperature 97.8 F (36.6 C), temperature source Oral, resp. rate (!) 23, height 5\' 2"  (1.575 m), weight 54.7 kg, SpO2 95 %.  Examination: General appearance: alert, cooperative and no distress Head: Normocephalic, without obvious  abnormality, atraumatic Eyes: EOMI Lungs: clear to auscultation bilaterally Heart: irregularly irregular rhythm and S1, S2 normal Abdomen: normal findings: bowel sounds normal  and soft, non-tender Extremities: no edema Skin: mobility and turgor normal Neurologic: Grossly normal  Consultants:   PCM  Procedures:     Data Reviewed: I have personally reviewed following labs and imaging studies Results for orders placed or performed during the hospital encounter of 04/03/21 (from the past 24 hour(s))  Glucose, capillary     Status: Abnormal   Collection Time: 04/07/21  4:07 PM  Result Value Ref Range   Glucose-Capillary 142 (H) 70 - 99 mg/dL  Glucose, capillary     Status: Abnormal   Collection Time: 04/07/21  8:14 PM  Result Value Ref Range   Glucose-Capillary 115 (H) 70 - 99 mg/dL   Comment 1 Notify RN    Comment 2 Document in Chart   Glucose, capillary     Status: Abnormal   Collection Time: 04/07/21 11:59 PM  Result Value Ref Range   Glucose-Capillary 45 (L) 70 - 99 mg/dL   Comment 1 Notify RN    Comment 2 Document in Chart   Glucose, capillary     Status: Abnormal   Collection Time: 04/08/21  1:05 AM  Result Value Ref Range   Glucose-Capillary 160 (H) 70 - 99 mg/dL  CBC     Status: Abnormal   Collection Time: 04/08/21  4:05 AM  Result Value Ref Range   WBC 9.9 4.0 - 10.5 K/uL   RBC 4.69 3.87 - 5.11 MIL/uL   Hemoglobin 15.3 (H) 12.0 - 15.0 g/dL   HCT 53.6 (H) 14.4 - 31.5 %   MCV 98.3 80.0 - 100.0 fL   MCH 32.6 26.0 - 34.0 pg   MCHC 33.2 30.0 - 36.0 g/dL   RDW 40.0 86.7 - 61.9 %   Platelets 252 150 - 400 K/uL   nRBC 0.0 0.0 - 0.2 %  Basic metabolic panel     Status: Abnormal   Collection Time: 04/08/21  4:05 AM  Result Value Ref Range   Sodium 133 (L) 135 - 145 mmol/L   Potassium 2.7 (LL) 3.5 - 5.1 mmol/L   Chloride 94 (L) 98 - 111 mmol/L   CO2 28 22 - 32 mmol/L   Glucose, Bld 82 70 - 99 mg/dL   BUN 12 8 - 23 mg/dL   Creatinine, Ser 5.09 0.44 - 1.00 mg/dL   Calcium 9.0 8.9 - 32.6 mg/dL   GFR, Estimated >71 >24 mL/min   Anion gap 11 5 - 15  Magnesium     Status: None   Collection Time: 04/08/21  4:05 AM  Result Value Ref Range    Magnesium 2.2 1.7 - 2.4 mg/dL  Glucose, capillary     Status: Abnormal   Collection Time: 04/08/21  4:25 AM  Result Value Ref Range   Glucose-Capillary 123 (H) 70 - 99 mg/dL   Comment 1 Notify RN    Comment 2 Document in Chart   Glucose, capillary     Status: Abnormal   Collection Time: 04/08/21 11:37 AM  Result Value Ref Range   Glucose-Capillary 17 (LL) 70 - 99 mg/dL   Comment 1 Notify RN   Glucose, capillary     Status: Abnormal   Collection Time: 04/08/21 12:02 PM  Result Value Ref Range   Glucose-Capillary 51 (L) 70 - 99 mg/dL  Glucose, capillary     Status: Abnormal   Collection Time: 04/08/21 12:38 PM  Result Value Ref Range   Glucose-Capillary 200 (H) 70 - 99 mg/dL    Recent Results (from the past 240 hour(s))  Resp Panel by RT-PCR (Flu A&B, Covid) Nasopharyngeal Swab     Status: None   Collection Time: 04/03/21  1:54 PM   Specimen: Nasopharyngeal Swab; Nasopharyngeal(NP) swabs in vial transport medium  Result Value Ref Range Status   SARS Coronavirus 2 by RT PCR NEGATIVE NEGATIVE Final    Comment: (NOTE) SARS-CoV-2 target nucleic acids are NOT DETECTED.  The SARS-CoV-2 RNA is generally detectable in upper respiratory specimens during the acute phase of infection. The lowest concentration of SARS-CoV-2 viral copies this assay can detect is 138 copies/mL. A negative result does not preclude SARS-Cov-2 infection and should not be used as the sole basis for treatment or other patient management decisions. A negative result may occur with  improper specimen collection/handling, submission of specimen other than nasopharyngeal swab, presence of viral mutation(s) within the areas targeted by this assay, and inadequate number of viral copies(<138 copies/mL). A negative result must be combined with clinical observations, patient history, and epidemiological information. The expected result is Negative.  Fact Sheet for Patients:   BloggerCourse.com  Fact Sheet for Healthcare Providers:  SeriousBroker.it  This test is no t yet approved or cleared by the [place] FDA and  has been authorized for detection and/or diagnosis of SARS-CoV-2 by FDA under an Emergency Use Authorization (EUA). This EUA will remain  in effect (meaning this test can be used) for the duration of the COVID-19 declaration under Section 564(b)(1) of the Act, 21 U.S.C.section 360bbb-3(b)(1), unless the authorization is terminated  or revoked sooner.       Influenza A by PCR NEGATIVE NEGATIVE Final   Influenza B by PCR NEGATIVE NEGATIVE Final    Comment: (NOTE) The Xpert Xpress SARS-CoV-2/FLU/RSV plus assay is intended as an aid in the diagnosis of influenza from Nasopharyngeal swab specimens and should not be used as a sole basis for treatment. Nasal washings and aspirates are unacceptable for Xpert Xpress SARS-CoV-2/FLU/RSV testing.  Fact Sheet for Patients: BloggerCourse.com  Fact Sheet for Healthcare Providers: SeriousBroker.it  This test is not yet approved or cleared by the [place] FDA and has been authorized for detection and/or diagnosis of SARS-CoV-2 by FDA under an Emergency Use Authorization (EUA). This EUA will remain in effect (meaning this test can be used) for the duration of the COVID-19 declaration under Section 564(b)(1) of the Act, 21 U.S.C. section 360bbb-3(b)(1), unless the authorization is terminated or revoked.  Performed at [facility], [address], [place], [place] [address]   MRSA PCR Screening     Status: Abnormal   Collection Time: 04/03/21  8:12 PM   Specimen: Nasal Mucosa; Nasopharyngeal  Result Value Ref Range Status   MRSA by PCR POSITIVE (A) NEGATIVE Final    Comment:        The GeneXpert MRSA Assay (FDA approved for NASAL specimens only), is one component of a comprehensive  MRSA colonization surveillance program. It is not intended to diagnose MRSA infection nor to guide or monitor treatment for MRSA infections. RESULT CALLED TO, READ BACK BY AND VERIFIED WITHChalmers Guest RN [id number] 04/03/21 A BROWNING Performed at [facility], [address] [place], [place] [address]   Culture, blood (routine x 2)     Status: None   Collection Time: 04/03/21  8:14 PM   Specimen: BLOOD RIGHT ARM  Result Value Ref Range Status  Specimen Description BLOOD RIGHT ARM  Final   Special Requests   Final    BOTTLES DRAWN AEROBIC ONLY Blood Culture results may not be optimal due to an inadequate volume of blood received in culture bottles   Culture   Final    NO GROWTH 5 DAYS Performed at [facility], [address] [place], [place] [address]    Report Status 04/08/2021 FINAL  Final  Culture, blood (routine x 2)     Status: None   Collection Time: 04/03/21  8:25 PM   Specimen: BLOOD LEFT ARM  Result Value Ref Range Status   Specimen Description BLOOD LEFT ARM  Final   Special Requests   Final    BOTTLES DRAWN AEROBIC AND ANAEROBIC Blood Culture results may not be optimal due to an inadequate volume of blood received in culture bottles   Culture   Final    NO GROWTH 5 DAYS Performed at [facility], [address] [place], [place] [address]    Report Status 04/08/2021 FINAL  Final  Urine Culture     Status: None   Collection Time: 04/03/21  8:50 PM   Specimen: Urine, Random  Result Value Ref Range Status   Specimen Description URINE, RANDOM  Final   Special Requests NONE  Final   Culture   Final    NO GROWTH Performed at [facility], [address] [place], [place] [address]    Report Status 04/05/2021 FINAL  Final     Radiology Studies: No results found. DG Swallowing Func-Speech Pathology  Final Result    DG CHEST PORT 1 VIEW  Final Result    CT Head Wo Contrast  Final Result    DG Chest Port 1 View  Final Result       Scheduled Meds: . chlorhexidine  15 mL Mouth Rinse BID  . Chlorhexidine Gluconate Cloth  6 each Topical Daily  . docusate  100 mg Per Tube BID  . levothyroxine  37.5 mcg Intravenous Daily  . mouth rinse  15 mL Mouth Rinse q12n4p  . polyethylene glycol  17 g Per Tube Daily   PRN Meds: docusate sodium, haloperidol lactate, hydrALAZINE, ondansetron (ZOFRAN) IV, polyethylene glycol, Resource ThickenUp Clear Continuous Infusions: . cefTRIAXone (ROCEPHIN)  IV 1 g (04/07/21 1704)  . dextrose 100 mL/hr at 04/08/21 1039  . heparin 800 Units/hr (04/08/21 1040)  . potassium chloride 10 mEq (04/08/21 1503)     LOS: 5 days  Time spent: Greater than 50% of the 35 minute visit was spent in counseling/coordination of care for the patient as laid out in the A&P.   Lewie Chamber, MD [facility] 04/08/2021, 3:29 PM

## 2021-04-08 NOTE — Progress Notes (Signed)
Chotiner, MD notified for critical result potassium 2.7  New Orders: Add on magnesium lab. Replace magnesium first, if needed, and then potassium every 1hr x 6.  Awaiting lab results.

## 2021-04-08 NOTE — Progress Notes (Signed)
ANTICOAGULATION CONSULT NOTE - Initial Consult  Pharmacy Consult for Heparin Indication: atrial fibrillation  No Known Allergies  Patient Measurements: Height: 5\' 2"  (157.5 cm) Weight: 54.7 kg (120 lb 9.5 oz) IBW/kg (Calculated) : 50.1 Heparin Dosing Weight: 54.7 kg  Vital Signs: Temp: 97.7 F (36.5 C) (04/13 0739) Temp Source: Oral (04/13 0739) BP: 136/102 (04/13 0739) Pulse Rate: 78 (04/13 0739)  Labs: Recent Labs    04/06/21 0203 04/07/21 0327 04/08/21 0405  HGB 14.3 14.5 15.3*  HCT 43.1 44.2 46.1*  PLT 193 235 252  CREATININE 1.27* 0.96 0.86    Estimated Creatinine Clearance: 35.1 mL/min (by C-G formula based on SCr of 0.86 mg/dL).   Medical History: Past Medical History:  Diagnosis Date  . Acute bronchitis 10/2017  . Arthritis   . CKD (chronic kidney disease)    Xarelto dose is 15 mg QD  . Coronary artery disease    a. s/p PCI/stenting to RCA and LAD ([facility] [place]) in 2006; b. Nuc 1/15: No ischemia, EF 70%  . Depression   . Gallstones   . Gastritis   . GERD (gastroesophageal reflux disease)   . Hiatal hernia   . Hip fracture (HCC)   . History of cholecystectomy   . History of echocardiogram    a. Echo 1/15: Moderate LVH, EF 55-60%, impaired relaxation, mild AS  . Hyperlipidemia   . Hypertension   . Hypokalemia   . Hypothyroidism   . Mobitz type 1 second degree atrioventricular block    a. Event Monitor 3/15: NSR, first-degree AV block, second-degree AB block - Mobitz 1, PACs, NSVT (5 beats)  . PAF (paroxysmal atrial fibrillation) (HCC)   . Thyroid disease     Medications:  Medications Prior to Admission  Medication Sig Dispense Refill Last Dose  . acetaminophen (TYLENOL) 325 MG tablet Take 650 mg by mouth every 6 (six) hours as needed for fever (or pain).   unk  . B Complex Vitamins (B COMPLEX 100 PO) Take 100 mcg by mouth in the morning.   04/03/2021 at 0800  . CALCIUM-MAGNESIUM-ZINC PO Take 1 tablet by mouth daily.    04/03/2021 at  0800  . Cholecalciferol (VITAMIN D3) 25 MCG (1000 UT) CAPS Take 1,000 Units by mouth in the morning.   04/03/2021 at 0800  . Ensure (ENSURE) Take 237 mLs by mouth in the morning and at bedtime.   04/03/2021 at 0800  . folic acid (FOLVITE) 1 MG tablet Take 1 tablet (1 mg total) by mouth daily. 90 tablet 0 04/03/2021 at 0800  . levothyroxine (SYNTHROID) 75 MCG tablet Take 1 tablet (75 mcg total) by mouth daily before breakfast. **PATIENT NEEDS LAB FOR FURTHER REFILLS** (Patient taking differently: Take 75 mcg by mouth daily before breakfast.) 30 tablet 0 04/03/2021 at 0730  . lidocaine (LIDODERM) 5 % Place 1 patch onto the skin daily. Remove & Discard patch within 12 hours or as directed by MD   04/03/2021 at 0800  . lisinopril (ZESTRIL) 2.5 MG tablet Take 2.5 mg by mouth in the morning.   04/03/2021 at 0800  . melatonin 5 MG TABS Take 5 mg by mouth at bedtime.   04/02/2021 at 2000  . metoprolol tartrate (LOPRESSOR) 25 MG tablet Take 0.5 tablets (12.5 mg total) by mouth 2 (two) times daily. **NEEDS APT FOR FURTHER MED REFILLS** (Patient taking differently: Take 12.5 mg by mouth in the morning and at bedtime.) 60 tablet 0 04/03/2021 at 0800  . Mouthwashes (LISTERINE ANTISEPTIC) LIQD  15 mLs by Mouth Rinse route in the morning.   04/03/2021 at 0800  . omeprazole (PRILOSEC) 20 MG capsule TAKE 1 CAPSULE BY MOUTH DAILY AS NEEDED FOR HEARTBURN. (Patient taking differently: Take 20 mg by mouth daily as needed (FOR GERD).) 90 capsule 3 unk  . PARoxetine (PAXIL) 40 MG tablet Take 40 mg by mouth in the morning.   04/03/2021 at 0800  . potassium chloride SA (KLOR-CON) 20 MEQ tablet Take 1 tablet (20 mEq total) by mouth daily. Please keep upcoming schedule for future refill (Patient taking differently: Take 20 mEq by mouth in the morning.) 30 tablet 0 04/03/2021 at 0800  . simvastatin (ZOCOR) 5 MG tablet TAKE 1 TABLET BY MOUTH EVERY EVENING. (Patient taking differently: Take 5 mg by mouth at bedtime.) 90 tablet 0 04/02/2021 at 2000  .  spironolactone (ALDACTONE) 25 MG tablet Take 12.5 mg by mouth in the morning.   04/03/2021 at 0800  . SYSTANE BALANCE 0.6 % SOLN Place 1 drop into both eyes in the morning.   04/03/2021 at 0800  . thiamine (VITAMIN B-1) 100 MG tablet Take 100 mg by mouth daily.   04/03/2021 at 0800  . vitamin B-12 100 MCG tablet Take 1 tablet (100 mcg total) by mouth daily. 30 tablet  04/03/2021 at 0800  . XARELTO 15 MG TABS tablet TAKE 1 TABLET BY MOUTH DAILY WITH SUPPER. (Patient taking differently: Take 15 mg by mouth daily with supper.) 90 tablet 0 04/02/2021 at 1700  . buPROPion (WELLBUTRIN XL) 150 MG 24 hr tablet Take 1 tablet (150 mg total) by mouth daily. (Patient not taking: Reported on 04/03/2021) 90 tablet 1 Not Taking at Unknown time  . ferrous sulfate 325 (65 FE) MG tablet Take 1 tablet (325 mg total) by mouth daily with breakfast for 14 days. (Patient not taking: Reported on 04/03/2021)  0 Not Taking at Unknown time  . HYDROcodone-acetaminophen (NORCO) 5-325 MG tablet Take 1-2 tablets by mouth every 6 (six) hours as needed for moderate pain or severe pain. (Patient not taking: Reported on 04/03/2021) 40 tablet 0 Not Taking at Unknown time  . PARoxetine (PAXIL) 20 MG tablet Take 1 tab by mouth daily for 2 weeks then increase to 2 tabs by mouth daily (Patient not taking: Reported on 04/03/2021) 90 tablet 0 Not Taking at Unknown time  . polyethylene glycol (MIRALAX / GLYCOLAX) 17 g packet Take 17 g by mouth daily. (Patient not taking: Reported on 04/03/2021) 14 each 0 Not Taking at Unknown time  . senna-docusate (SENOKOT-S) 8.6-50 MG tablet Take 1 tablet by mouth at bedtime. (Patient not taking: Reported on 04/03/2021)   Not Taking at Unknown time    Assessment: Christina Ortiz is an 87 year old female with history of paroxysmal A. fib on Xarelto 15 mg PO daily prior to admission. Patient has been NPO since admission with concern for aspiration and dysphagia. Patient has been getting prophylactic heparin subQ dosing up to this  point. Pharmacy now consulted to start therapeutic heparin while Xarelto is on hold. Patient received heparin 5000 units subQ this morning at 0507.    Goal of Therapy:  Heparin level 0.3-0.7 units/ml Monitor platelets by anticoagulation protocol: Yes   Plan:  Start heparin infusion at 800 units/hr Check anti-Xa level in 8 hours and daily while on heparin Continue to monitor H&H and platelets F/u ability to restart oral AC    Thank you for allowing [place] to participate in this patients care. Signe Colt, PharmD 04/08/2021 8:41 AM  Please check [email or web address] for unit-specific pharmacy phone numbers.

## 2021-04-08 NOTE — Progress Notes (Signed)
Mobility Specialist: Progress Note   04/08/21 1643  Mobility  Activity Contraindicated/medical hold   Instructed by RN to not see pt due to consistent low blood sugar today.   [facility] Madison Direnzo Mobility Specialist Mobility Specialist Phone: [phone]

## 2021-04-08 NOTE — Progress Notes (Signed)
  Speech Language Pathology Treatment: Dysphagia  Patient Details Name: Christina Ortiz MRN: [id number] DOB: 03/25/1929 Today's Date: 04/08/2021 Time: 4098-1191 SLP Time Calculation (min) (ACUTE ONLY): 20 min  Assessment / Plan / Recommendation Clinical Impression  SLP provided diligent oral care and several ice chips to facilitate use of her swallowing musculature. Today's treatment session then focused on swallowing strategies including holding boluses prior to swallowing, using an effortful swallow, and coughing after each swallow. Pt demonstrated use of swallow strategies with consistent verbal cues but could not follow them with independence. SLP attempted using teach back and repetition of strategies but with limited carry over. Coughing was noted following each PO trial but it is difficult to assess if this was due to potential aspiration or just from her following swallowing strategies particularly given silent aspiration on MBS. Recommend that she remain NPO with diligent oral care as GOC continue to be discussed.    HPI HPI: Pt is an 87 y.o. female with acute hypoxemic respiratory failure secondary to potential aspiration episode with vomiting. Also presents with sepsis secondary to presumed UTI. CXR 4/11: Worsening patchy airspace disease in the left mid lung and right lower lobe. Esophagram (2019): Significant distal esophageal stricture above a moderate-sized hiatal hernia; Esophageal dysmotility. PMH: GERD, HTN, hiatal hernia, gastritis      SLP Plan  Continue with current plan of care       Recommendations  Diet recommendations: NPO Medication Administration: Via alternative means                Oral Care Recommendations: Oral care QID Follow up Recommendations:  (tba) SLP Visit Diagnosis: Dysphagia, oropharyngeal phase (R13.12) Plan: Continue with current plan of care       GO                Zettie Cooley., SLP Student 04/08/2021, 11:18 AM

## 2021-04-08 NOTE — TOC Progression Note (Signed)
Transition of Care [facility]) - Progression Note    Patient Details  Name: Nekisha Mcdiarmid MRN: [id number] Date of Birth: 05/07/1929  [facility] CM/SW Contact  Eduard Roux, [place] Phone Number: 04/08/2021, 4:20 PM  Clinical Narrative:     CSW called [facility]- left message to return call.   Antony Blackbird, MSW, LCSW Clinical Social Worker   Expected Discharge Plan: Assisted Living Barriers to Discharge: Continued Medical Work up  Expected Discharge Plan and Services Expected Discharge Plan: Assisted Living In-house Referral: Clinical Social Work                                             Social Determinants of Health (SDOH) Interventions    Readmission Risk Interventions No flowsheet data found.

## 2021-04-08 NOTE — Progress Notes (Addendum)
Critical CBG 17. Pt NPO. 1 amp D50 given. D10 continuous still infusing at 188ml/hr.  Girguis, MD made aware.  @1202  pt CBG 51. 1 additional amp D50 given. Will continue to monitor.  @1239  pt CBG 200.

## 2021-04-08 NOTE — Consult Note (Signed)
Consultation Note Date: 04/08/2021   Patient Name: Christina Ortiz  DOB: 06-07-1929  MRN: [id number]  Age / Sex: 87 y.o., female  PCP: No primary care provider on file. Referring Physician: Dwyane Dee, MD  Reason for Consultation: Establishing goals of care  HPI/Patient Profile: 87 y.o. female  with past medical history of PAF anticoagulated on half dose Eliquis, CKD, HTN, HLD, hypothyroidism, GERD, and dementia  admitted on 04/03/2021 with AMS, SOB, and HTN. Patient admitted for acute respiratory failure precipitated by flash pulmonary edema, aspiration event, with underlying history of diastolic CHF, hypertensive urgency. Also with dysphagia - made NPO with ongoing aspiration. PMT consulted to discuss [place].   Clinical Assessment and Goals of Care: I have reviewed medical records including EPIC notes, labs and imaging, received report from RN, assessed the patient and then met with patient's daughter and son in law  to discuss diagnosis prognosis, GOC, EOL wishes, disposition and options.  I introduced Palliative Medicine as specialized medical care for people living with serious illness. It focuses on providing relief from the symptoms and stress of a serious illness. The goal is to improve quality of life for both the patient and the family.  Family shares about patient's decline since her fall in January. Prior to fall patient was living independently. They tell me about her difficult recovery from surgery following her fall. They feel this event accelerated her dementia disease process. From rehab facility she went to ALF and has lived there several months. Patient's spouse passed away ~4 years ago. Patient is fiercly independent and strong-willed.    We discussed patient's current illness and what it means in the larger context of patient's on-going co-morbidities.  Natural disease trajectory and expectations at EOL were discussed. We discussed  chronic, progressive nature of dementia. Discussed the way dementia disease process affects swallowing.   I attempted to elicit values and goals of care important to the patient. The difference between aggressive medical intervention and comfort care was considered in light of the patient's goals of care.   We discussed that evidence has shown that PEG tubes in patients with advanced dementia do not increase survival, prevent aspiration, or improve wound healing.  They can promote isolation and use of restraints leading to increased potential for pressure ulcers. Use of PEG tubes is not medically recommended in this population; rather, careful hand feeding with aspiration precautions has been shown to provide the best quality of life.   Family agrees that PEG would not be in patient's best interest - we also discuss probability of patient pulling it out. They would like to attempt careful hand feeding with a safe diet for her. They understand and accept risk of aspiration. They understand time is likely limited and patient is likely nearing end of life.   Hospice services outpatient were explained and offered. Discussed possibility of hospice support at ALF. Family agrees.  Daughter plans to reach out of ALF to discuss dietary accommodations and hospice involvement.   Questions and concerns were addressed. The family was encouraged to call with questions or concerns.   Primary Decision Maker NEXT OF KIN - daughter - Christina Ortiz   SUMMARY OF RECOMMENDATIONS   - NO feeding tube - careful hand feeding - will start dys 1/thin liquid diet as discussed with speech therapy (thickened liquids did not decrease risk of aspiration for her) - hospice support upon return to ALF - family requests patient stay until at least Friday - would like to observe her on new  diet prior to dc back   Code Status/Advance Care Planning:  DNR  Prognosis:   < 6 months  Discharge Planning: ALF with hospice      Primary  Diagnoses: Present on Admission: . Pulmonary edema cardiac cause ([place])   I have reviewed the medical record, interviewed the patient and family, and examined the patient. The following aspects are pertinent.  Past Medical History:  Diagnosis Date  . Acute bronchitis 10/2017  . Arthritis   . CKD (chronic kidney disease)    Xarelto dose is 15 mg QD  . Coronary artery disease    a. s/p PCI/stenting to RCA and LAD ([facility] [place]) in 2006; b. Nuc 1/15: No ischemia, EF 70%  . Depression   . Gallstones   . Gastritis   . GERD (gastroesophageal reflux disease)   . Hiatal hernia   . Hip fracture ([place])   . History of cholecystectomy   . History of echocardiogram    a. Echo 1/15: Moderate LVH, EF 55-60%, impaired relaxation, mild AS  . Hyperlipidemia   . Hypertension   . Hypokalemia   . Hypothyroidism   . Mobitz type 1 second degree atrioventricular block    a. Event Monitor 3/15: NSR, first-degree AV block, second-degree AB block - Mobitz 1, PACs, NSVT (5 beats)  . PAF (paroxysmal atrial fibrillation) ([place])   . Thyroid disease    Social History   Socioeconomic History  . Marital status: Widowed    Spouse name: Not on file  . Number of children: 1  . Years of education: Not on file  . Highest education level: Not on file  Occupational History  . Occupation: retired  Tobacco Use  . Smoking status: Former Smoker    Packs/day: 0.50    Years: 67.00    Pack years: 33.50    Types: Cigarettes    Quit date: 10/24/2014    Years since quitting: 6.4  . Smokeless tobacco: Never Used  . Tobacco comment: quit 2015  Vaping Use  . Vaping Use: Never used  Substance and Sexual Activity  . Alcohol use: Yes    Alcohol/week: 4.0 standard drinks    Types: 4 Shots of liquor per week    Comment: vodka  . Drug use: No  . Sexual activity: Never    Birth control/protection: Post-menopausal  Other Topics Concern  . Not on file  Social History Narrative  . Not on file   Social  Determinants of Health   Financial Resource Strain: Not on file  Food Insecurity: Not on file  Transportation Needs: Not on file  Physical Activity: Not on file  Stress: Not on file  Social Connections: Not on file   Family History  Problem Relation Age of Onset  . Heart disease Mother   . Heart attack Mother   . Lung cancer Father   . Lymphoma Brother   . Heart failure Brother   . Esophageal cancer Paternal Uncle   . Colon cancer Neg Hx   . Colon polyps Neg Hx   . Rectal cancer Neg Hx   . Stomach cancer Neg Hx    Scheduled Meds: . chlorhexidine  15 mL Mouth Rinse BID  . Chlorhexidine Gluconate Cloth  6 each Topical Daily  . docusate  100 mg Per Tube BID  . levothyroxine  37.5 mcg Intravenous Daily  . mouth rinse  15 mL Mouth Rinse q12n4p  . polyethylene glycol  17 g Per Tube Daily   Continuous Infusions: .  cefTRIAXone (ROCEPHIN)  IV 1 g (04/07/21 1704)  . dextrose 100 mL/hr at 04/08/21 1039  . heparin 800 Units/hr (04/08/21 1040)  . potassium chloride 10 mEq (04/08/21 1155)   PRN Meds:.docusate sodium, haloperidol lactate, hydrALAZINE, ondansetron (ZOFRAN) IV, polyethylene glycol, Resource ThickenUp Clear No Known Allergies Review of Systems  Unable to perform ROS: Dementia    Physical Exam Constitutional:      General: She is not in acute distress. Pulmonary:     Effort: Pulmonary effort is normal.  Skin:    General: Skin is warm and dry.  Neurological:     Mental Status: She is alert. She is disoriented.     Vital Signs: BP 139/90 (BP Location: Right Arm)   Pulse 82   Temp 97.8 F (36.6 C) (Oral)   Resp (!) 23   Ht '5\' 2"'  (1.575 m)   Wt 54.7 kg   LMP  (LMP Unknown)   SpO2 95%   BMI 22.06 kg/m  Pain Scale: 0-10   Pain Score: 0-No pain   SpO2: SpO2: 95 % O2 Device:SpO2: 95 % O2 Flow Rate: .O2 Flow Rate (L/min): 2 L/min  IO: Intake/output summary:   Intake/Output Summary (Last 24 hours) at 04/08/2021 1305 Last data filed at 04/08/2021  1009 Gross per 24 hour  Intake 1561.72 ml  Output 1975 ml  Net -413.28 ml    LBM: Last BM Date: 04/07/21 Baseline Weight: Weight: 45.4 kg Most recent weight: Weight: 54.7 kg     Palliative Assessment/Data: PPS 40%    Time Total: 75 minutes Greater than 50%  of this time was spent counseling and coordinating care related to the above assessment and plan.  Juel Burrow, DNP, AGNP-C Palliative Medicine Team [phone] Pager: [phone]

## 2021-04-08 NOTE — Progress Notes (Signed)
  Critical CBG 45. Pt NPO, 1 amp of D50 given. D10 continuous still infusing at 100 ml/hr.  Chot[place], MD made aware.

## 2021-04-08 NOTE — Progress Notes (Signed)
Critical CBG 52. 1 amp D50 given. D10 continuous still infusing at 161ml/hr.  Kenard Gower, RN

## 2021-04-09 DIAGNOSIS — Z7189 Other specified counseling: Secondary | ICD-10-CM | POA: Diagnosis not present

## 2021-04-09 DIAGNOSIS — F0391 Unspecified dementia with behavioral disturbance: Secondary | ICD-10-CM | POA: Diagnosis not present

## 2021-04-09 DIAGNOSIS — R1310 Dysphagia, unspecified: Secondary | ICD-10-CM | POA: Diagnosis not present

## 2021-04-09 DIAGNOSIS — Z515 Encounter for palliative care: Secondary | ICD-10-CM | POA: Diagnosis not present

## 2021-04-09 DIAGNOSIS — G9341 Metabolic encephalopathy: Secondary | ICD-10-CM | POA: Diagnosis not present

## 2021-04-09 LAB — CBC WITH DIFFERENTIAL/PLATELET
Abs Immature Granulocytes: 0.19 10*3/uL — ABNORMAL HIGH (ref 0.00–0.07)
Basophils Absolute: 0 10*3/uL (ref 0.0–0.1)
Basophils Relative: 0 %
Eosinophils Absolute: 0.1 10*3/uL (ref 0.0–0.5)
Eosinophils Relative: 1 %
HCT: 44.7 % (ref 36.0–46.0)
Hemoglobin: 14.8 g/dL (ref 12.0–15.0)
Immature Granulocytes: 2 %
Lymphocytes Relative: 9 %
Lymphs Abs: 1.1 10*3/uL (ref 0.7–4.0)
MCH: 32.2 pg (ref 26.0–34.0)
MCHC: 33.1 g/dL (ref 30.0–36.0)
MCV: 97.2 fL (ref 80.0–100.0)
Monocytes Absolute: 1 10*3/uL (ref 0.1–1.0)
Monocytes Relative: 8 %
Neutro Abs: 9.2 10*3/uL — ABNORMAL HIGH (ref 1.7–7.7)
Neutrophils Relative %: 80 %
Platelets: 238 10*3/uL (ref 150–400)
RBC: 4.6 MIL/uL (ref 3.87–5.11)
RDW: 13.2 % (ref 11.5–15.5)
WBC: 11.6 10*3/uL — ABNORMAL HIGH (ref 4.0–10.5)
nRBC: 0 % (ref 0.0–0.2)

## 2021-04-09 LAB — GLUCOSE, CAPILLARY
Glucose-Capillary: 116 mg/dL — ABNORMAL HIGH (ref 70–99)
Glucose-Capillary: 13 mg/dL — CL (ref 70–99)
Glucose-Capillary: 135 mg/dL — ABNORMAL HIGH (ref 70–99)
Glucose-Capillary: 49 mg/dL — ABNORMAL LOW (ref 70–99)
Glucose-Capillary: 50 mg/dL — ABNORMAL LOW (ref 70–99)
Glucose-Capillary: 57 mg/dL — ABNORMAL LOW (ref 70–99)
Glucose-Capillary: 57 mg/dL — ABNORMAL LOW (ref 70–99)
Glucose-Capillary: 59 mg/dL — ABNORMAL LOW (ref 70–99)
Glucose-Capillary: 68 mg/dL — ABNORMAL LOW (ref 70–99)
Glucose-Capillary: 75 mg/dL (ref 70–99)
Glucose-Capillary: 80 mg/dL (ref 70–99)
Glucose-Capillary: 81 mg/dL (ref 70–99)
Glucose-Capillary: 82 mg/dL (ref 70–99)
Glucose-Capillary: 84 mg/dL (ref 70–99)

## 2021-04-09 LAB — BASIC METABOLIC PANEL
Anion gap: 10 (ref 5–15)
BUN: 12 mg/dL (ref 8–23)
CO2: 27 mmol/L (ref 22–32)
Calcium: 9.1 mg/dL (ref 8.9–10.3)
Chloride: 96 mmol/L — ABNORMAL LOW (ref 98–111)
Creatinine, Ser: 0.95 mg/dL (ref 0.44–1.00)
GFR, Estimated: 57 mL/min — ABNORMAL LOW (ref >60)
Glucose, Bld: 84 mg/dL (ref 70–99)
Potassium: 4.3 mmol/L (ref 3.5–5.1)
Sodium: 133 mmol/L — ABNORMAL LOW (ref 135–145)

## 2021-04-09 LAB — MAGNESIUM: Magnesium: 1.7 mg/dL (ref 1.7–2.4)

## 2021-04-09 LAB — HEPARIN LEVEL (UNFRACTIONATED): Heparin Unfractionated: <0.1 IU/mL — ABNORMAL LOW (ref 0.30–0.70)

## 2021-04-09 MED ORDER — RIVAROXABAN 15 MG PO TABS
15.0000 mg | ORAL_TABLET | Freq: Every day | ORAL | Status: DC
  Administered 2021-04-09 – 2021-04-13 (×5): 15 mg via ORAL
  Filled 2021-04-09 (×5): qty 1

## 2021-04-09 MED ORDER — LEVOTHYROXINE SODIUM 75 MCG PO TABS
75.0000 ug | ORAL_TABLET | Freq: Every day | ORAL | Status: DC
  Administered 2021-04-10 – 2021-04-13 (×4): 75 ug via ORAL
  Filled 2021-04-09 (×4): qty 1

## 2021-04-09 NOTE — Progress Notes (Signed)
Progress Note    Christina Ortiz   KZS:[id number]  DOB: [birth year]  DOA: 04/03/2021     6  PCP: No primary care provider on file.  CC: SOB  Hospital Course: Patient is a 87 year old female with history of paroxysmal A. fib on Eliquis, CKD, hypertension, hyperlipidemia, hypothyroidism, GERD, mild dementia presented from local ALF with altered mental status, shortness of breath and severe hypertension.  Patient's daughter had reported altered mental status with an episode of vomiting followed by hypoxia later severe hypertension. In ED, patient was hypertensive with BP 203/137, O2 sats appropriate for 4 L [place].  Patient was placed on nitro drip for hypertensive urgency.  However due to worsening mental status, hypoxic and hypotension, nitro drip was discontinued, placed BiPAP and patient was admitted to ICU for further work-up.  Interval History:  Palliative care met with family yesterday.  Plan is to avoid tube feeds and allow patient a dysphagia diet as she can tolerate.  Family plans to discharge patient with hospice in place.  She will go to [facility].  ROS: Constitutional: negative for chills and fevers, Respiratory: negative for cough, Cardiovascular: negative for chest pain and Gastrointestinal: negative for abdominal pain  Assessment & Plan: Acute respiratory failure with hypoxia - Multifactorial, precipitated by flash pulmonary edema, aspiration event, with underlying history of diastolic CHF, hypertensive urgency -Off the BiPAP, chest x-ray showed persistent cardiomegaly with left base atelectasis.  - O2 sats 96- 99% on 2 L O2 via [place], wean as tolerated.  Now on room air -2D echo 4/10 showed EF 73-22%, diastolic function indeterminate.  Mild to moderate mitral valve regurgitation -DC Lasix. -Aspiration precautions  Hypokalemia, hypomagnesemia -Aggressively replaced IV  Hypoglycemia -Has been on D10 infusion.  Questionable reliability with CBG checks however glucose has been  low on BMP at times.  Diet has now been resumed (dysphagia) -Discontinue D10 infusion, continue CBG checks for now.  If remains stable, can further back off  Acute metabolic encephalopathy -Multifactorial in the setting of hypertensive urgency, delirium, infection -Currently alert and oriented, improving -Delirium and aspiration precautions  Dysphagia likely due to aspiration event -Family discussion held with palliative care.  Plan is for resuming dysphagia diet and continuing aspiration precautions; family understands the risk -Continue aspiration precautions -Plan is to discharge back to [place] with hospice in place  Hypothyroidism -TSH 4.7, free T4 1.1 -Patient on Synthroid 75 MCG daily outpatient - resume oral synthroid  History of paroxysmal atrial fibrillation -Rate controlled - d/c heparin drip now that taking PO again - resume xarelto    Old records reviewed in assessment of this patient   DVT prophylaxis: SCDs Start: 04/03/21 1723 Rivaroxaban (XARELTO) tablet 15 mg Heparin drip: 04/08/21 > 4/14. Resume xarelto 4/14  Code Status:   Code Status: DNR Family Communication:   Disposition Plan: Status is: Inpatient  Remains inpatient appropriate because:Inpatient level of care appropriate due to severity of illness   Dispo: The patient is from: Home              Anticipated d/c is to: [facility] with hospice              Patient currently is not medically stable to d/c.   Difficult to place patient No  Risk of unplanned readmission score: Unplanned Admission- Pilot do not use: 18.93   Objective: Blood pressure 131/71, pulse 85, temperature 97.6 F (36.4 C), temperature source Axillary, resp. rate 19, height '5\' 2"'  (1.575 m), weight 54.2 kg, SpO2 94 %.  Examination: General appearance: alert, cooperative and no distress Head: Normocephalic, without obvious abnormality, atraumatic Eyes: EOMI Lungs: clear to auscultation bilaterally Heart: irregularly  irregular rhythm and S1, S2 normal Abdomen: normal findings: bowel sounds normal and soft, non-tender Extremities: edema noted in UE's Skin: mobility and turgor normal Neurologic: Grossly normal  Consultants:   PCM  Procedures:     Data Reviewed: I have personally reviewed following labs and imaging studies Results for orders placed or performed during the hospital encounter of 04/03/21 (from the past 24 hour(s))  Glucose, capillary     Status: Abnormal   Collection Time: 04/08/21  4:30 PM  Result Value Ref Range   Glucose-Capillary 52 (L) 70 - 99 mg/dL  Basic metabolic panel     Status: Abnormal   Collection Time: 04/08/21  5:31 PM  Result Value Ref Range   Sodium 132 (L) 135 - 145 mmol/L   Potassium 3.2 (L) 3.5 - 5.1 mmol/L   Chloride 93 (L) 98 - 111 mmol/L   CO2 26 22 - 32 mmol/L   Glucose, Bld 97 70 - 99 mg/dL   BUN 10 8 - 23 mg/dL   Creatinine, Ser 0.88 0.44 - 1.00 mg/dL   Calcium 8.8 (L) 8.9 - 10.3 mg/dL   GFR, Estimated >60 >60 mL/min   Anion gap 13 5 - 15  Heparin level (unfractionated)     Status: Abnormal   Collection Time: 04/08/21  5:31 PM  Result Value Ref Range   Heparin Unfractionated 0.19 (L) 0.30 - 0.70 IU/mL  Glucose, capillary     Status: Abnormal   Collection Time: 04/08/21  5:59 PM  Result Value Ref Range   Glucose-Capillary 66 (L) 70 - 99 mg/dL  Glucose, capillary     Status: None   Collection Time: 04/08/21  6:43 PM  Result Value Ref Range   Glucose-Capillary 70 70 - 99 mg/dL  Glucose, capillary     Status: None   Collection Time: 04/08/21  8:23 PM  Result Value Ref Range   Glucose-Capillary 90 70 - 99 mg/dL  Glucose, capillary     Status: Abnormal   Collection Time: 04/09/21 12:43 AM  Result Value Ref Range   Glucose-Capillary 50 (L) 70 - 99 mg/dL  Glucose, capillary     Status: Abnormal   Collection Time: 04/09/21  1:17 AM  Result Value Ref Range   Glucose-Capillary 57 (L) 70 - 99 mg/dL  Glucose, capillary     Status: Abnormal    Collection Time: 04/09/21  2:30 AM  Result Value Ref Range   Glucose-Capillary 49 (L) 70 - 99 mg/dL  Glucose, capillary     Status: Abnormal   Collection Time: 04/09/21  2:38 AM  Result Value Ref Range   Glucose-Capillary 59 (L) 70 - 99 mg/dL  Basic metabolic panel     Status: Abnormal   Collection Time: 04/09/21  2:51 AM  Result Value Ref Range   Sodium 133 (L) 135 - 145 mmol/L   Potassium 4.3 3.5 - 5.1 mmol/L   Chloride 96 (L) 98 - 111 mmol/L   CO2 27 22 - 32 mmol/L   Glucose, Bld 84 70 - 99 mg/dL   BUN 12 8 - 23 mg/dL   Creatinine, Ser 0.95 0.44 - 1.00 mg/dL   Calcium 9.1 8.9 - 10.3 mg/dL   GFR, Estimated 57 (L) >60 mL/min   Anion gap 10 5 - 15  Heparin level (unfractionated)     Status: Abnormal   Collection Time: 04/09/21  2:51 AM  Result Value Ref Range   Heparin Unfractionated <0.10 (L) 0.30 - 0.70 IU/mL  CBC with Differential/Platelet     Status: Abnormal   Collection Time: 04/09/21  2:51 AM  Result Value Ref Range   WBC 11.6 (H) 4.0 - 10.5 K/uL   RBC 4.60 3.87 - 5.11 MIL/uL   Hemoglobin 14.8 12.0 - 15.0 g/dL   HCT 44.7 36.0 - 46.0 %   MCV 97.2 80.0 - 100.0 fL   MCH 32.2 26.0 - 34.0 pg   MCHC 33.1 30.0 - 36.0 g/dL   RDW 13.2 11.5 - 15.5 %   Platelets 238 150 - 400 K/uL   nRBC 0.0 0.0 - 0.2 %   Neutrophils Relative % 80 %   Neutro Abs 9.2 (H) 1.7 - 7.7 K/uL   Lymphocytes Relative 9 %   Lymphs Abs 1.1 0.7 - 4.0 K/uL   Monocytes Relative 8 %   Monocytes Absolute 1.0 0.1 - 1.0 K/uL   Eosinophils Relative 1 %   Eosinophils Absolute 0.1 0.0 - 0.5 K/uL   Basophils Relative 0 %   Basophils Absolute 0.0 0.0 - 0.1 K/uL   Immature Granulocytes 2 %   Abs Immature Granulocytes 0.19 (H) 0.00 - 0.07 K/uL  Magnesium     Status: None   Collection Time: 04/09/21  2:51 AM  Result Value Ref Range   Magnesium 1.7 1.7 - 2.4 mg/dL  Glucose, capillary     Status: Abnormal   Collection Time: 04/09/21  4:07 AM  Result Value Ref Range   Glucose-Capillary 57 (L) 70 - 99 mg/dL   Glucose, capillary     Status: Abnormal   Collection Time: 04/09/21  4:09 AM  Result Value Ref Range   Glucose-Capillary 20 (LL) 70 - 99 mg/dL   Comment 1 Repeat Test   Glucose, capillary     Status: Abnormal   Collection Time: 04/09/21  4:22 AM  Result Value Ref Range   Glucose-Capillary 116 (H) 70 - 99 mg/dL  Glucose, capillary     Status: Abnormal   Collection Time: 04/09/21  6:48 AM  Result Value Ref Range   Glucose-Capillary 135 (H) 70 - 99 mg/dL  Glucose, capillary     Status: None   Collection Time: 04/09/21 11:17 AM  Result Value Ref Range   Glucose-Capillary 84 70 - 99 mg/dL  Glucose, capillary     Status: None   Collection Time: 04/09/21  1:35 PM  Result Value Ref Range   Glucose-Capillary 80 70 - 99 mg/dL    Recent Results (from the past 240 hour(s))  Resp Panel by RT-PCR (Flu A&B, Covid) Nasopharyngeal Swab     Status: None   Collection Time: 04/03/21  1:54 PM   Specimen: Nasopharyngeal Swab; Nasopharyngeal(NP) swabs in vial transport medium  Result Value Ref Range Status   SARS Coronavirus 2 by RT PCR NEGATIVE NEGATIVE Final    Comment: (NOTE) SARS-CoV-2 target nucleic acids are NOT DETECTED.  The SARS-CoV-2 RNA is generally detectable in upper respiratory specimens during the acute phase of infection. The lowest concentration of SARS-CoV-2 viral copies this assay can detect is 138 copies/mL. A negative result does not preclude SARS-Cov-2 infection and should not be used as the sole basis for treatment or other patient management decisions. A negative result may occur with  improper specimen collection/handling, submission of specimen other than nasopharyngeal swab, presence of viral mutation(s) within the areas targeted by this assay, and inadequate number of viral copies(<138 copies/mL). A  negative result must be combined with clinical observations, patient history, and epidemiological information. The expected result is Negative.  Fact Sheet for Patients:   EntrepreneurPulse.com.au  Fact Sheet for Healthcare Providers:  IncredibleEmployment.be  This test is no t yet approved or cleared by the [place] FDA and  has been authorized for detection and/or diagnosis of SARS-CoV-2 by FDA under an Emergency Use Authorization (EUA). This EUA will remain  in effect (meaning this test can be used) for the duration of the COVID-19 declaration under Section 564(b)(1) of the Act, 21 U.S.C.section 360bbb-3(b)(1), unless the authorization is terminated  or revoked sooner.       Influenza A by PCR NEGATIVE NEGATIVE Final   Influenza B by PCR NEGATIVE NEGATIVE Final    Comment: (NOTE) The Xpert Xpress SARS-CoV-2/FLU/RSV plus assay is intended as an aid in the diagnosis of influenza from Nasopharyngeal swab specimens and should not be used as a sole basis for treatment. Nasal washings and aspirates are unacceptable for Xpert Xpress SARS-CoV-2/FLU/RSV testing.  Fact Sheet for Patients: EntrepreneurPulse.com.au  Fact Sheet for Healthcare Providers: IncredibleEmployment.be  This test is not yet approved or cleared by the [place] FDA and has been authorized for detection and/or diagnosis of SARS-CoV-2 by FDA under an Emergency Use Authorization (EUA). This EUA will remain in effect (meaning this test can be used) for the duration of the COVID-19 declaration under Section 564(b)(1) of the Act, 21 U.S.C. section 360bbb-3(b)(1), unless the authorization is terminated or revoked.  Performed at [facility], [place] [address] [place], [place] [address]   MRSA PCR Screening     Status: Abnormal   Collection Time: 04/03/21  8:12 PM   Specimen: Nasal Mucosa; Nasopharyngeal  Result Value Ref Range Status   MRSA by PCR POSITIVE (A) NEGATIVE Final    Comment:        The GeneXpert MRSA Assay (FDA approved for NASAL specimens only), is one component of a comprehensive  MRSA colonization surveillance program. It is not intended to diagnose MRSA infection nor to guide or monitor treatment for MRSA infections. RESULT CALLED TO, READ BACK BY AND VERIFIED WITHSophronia Simas RN [id number] 04/03/21 A BROWNING Performed at [facility], [place] [address] [place], [place] [address]   Culture, blood (routine x 2)     Status: None   Collection Time: 04/03/21  8:14 PM   Specimen: BLOOD RIGHT ARM  Result Value Ref Range Status   Specimen Description BLOOD RIGHT ARM  Final   Special Requests   Final    BOTTLES DRAWN AEROBIC ONLY Blood Culture results may not be optimal due to an inadequate volume of blood received in culture bottles   Culture   Final    NO GROWTH 5 DAYS Performed at [facility], [place] [address] [place], [place] [address]    Report Status 04/08/2021 FINAL  Final  Culture, blood (routine x 2)     Status: None   Collection Time: 04/03/21  8:25 PM   Specimen: BLOOD LEFT ARM  Result Value Ref Range Status   Specimen Description BLOOD LEFT ARM  Final   Special Requests   Final    BOTTLES DRAWN AEROBIC AND ANAEROBIC Blood Culture results may not be optimal due to an inadequate volume of blood received in culture bottles   Culture   Final    NO GROWTH 5 DAYS Performed at [facility], [place] [address], [place],  [address]  Report Status 04/08/2021 FINAL  Final  Urine Culture     Status: None   Collection Time: 04/03/21  8:50 PM   Specimen: Urine, Random  Result Value Ref Range Status   Specimen Description URINE, RANDOM  Final   Special Requests NONE  Final   Culture   Final    NO GROWTH Performed at [facility], [address], [place], [place] [address]    Report Status 04/05/2021 FINAL  Final     Radiology Studies: No results found. DG Swallowing Func-Speech Pathology  Final Result    DG CHEST PORT 1 VIEW  Final Result    CT Head Wo Contrast  Final Result    DG Chest Port 1 View  Final Result       Scheduled Meds: . chlorhexidine  15 mL Mouth Rinse BID  . Chlorhexidine Gluconate Cloth  6 each Topical Daily  . docusate  100 mg Per Tube BID  . levothyroxine  37.5 mcg Intravenous Daily  . mouth rinse  15 mL Mouth Rinse q12n4p  . polyethylene glycol  17 g Per Tube Daily  . Rivaroxaban  15 mg Oral Q supper   PRN Meds: docusate sodium, haloperidol lactate, hydrALAZINE, ondansetron (ZOFRAN) IV, polyethylene glycol, Resource ThickenUp Clear Continuous Infusions: . cefTRIAXone (ROCEPHIN)  IV 1 g (04/08/21 1811)     LOS: 6 days  Time spent: Greater than 50% of the 35 minute visit was spent in counseling/coordination of care for the patient as laid out in the A&P.   Dwyane Dee, MD [facility] 04/09/2021, 3:56 PM

## 2021-04-09 NOTE — Progress Notes (Signed)
Civil engineer, contracting [facility])  Received request from [facility] for hospice services at [facility] after discharge.  Chart and pt information under review by [facility] physician.  Hospice eligibility pending at this time.  Hospital liaison spoke with [name], pt's daughter, to initiate education related to hospice philosophy and services and to answer any questions at this time.  [name] verbalized understanding of information given. Discharge date not certain at this time.   [place] send signed and completed DNR home with pt/family.  Please provide prescriptions at discharge as needed to ensure ongoing symptom management until pt can be admitted onto hospice services.    DME needs discussed.  Family denies any DME needs at this time, wishes to wait to order overbed table until pt is back at [facility].   ACC information and contact numbers given to [facility].  Above information shared with [facility] Manager.  Please call with any questions or concerns.  Thank you for the opportunity to participate in this pt's care.  Gillian Scarce, BSN, RN [employer] [phone] (24h on call)

## 2021-04-09 NOTE — Progress Notes (Signed)
ANTICOAGULATION CONSULT NOTE  Pharmacy Consult for Heparin Indication: atrial fibrillation  No Known Allergies  Patient Measurements: Height: 5\' 2"  (157.5 cm) Weight: 54.7 kg (120 lb 9.5 oz) IBW/kg (Calculated) : 50.1 Heparin Dosing Weight: 54.7 kg  Vital Signs: Temp: 98.2 F (36.8 C) (04/14 0043) Temp Source: Oral (04/14 0043) BP: 128/83 (04/14 0043) Pulse Rate: 91 (04/13 2007)  Labs: Recent Labs    04/07/21 0327 04/08/21 0405 04/08/21 1731 04/09/21 0251  HGB 14.5 15.3*  --  14.8  HCT 44.2 46.1*  --  44.7  PLT 235 252  --  238  HEPARINUNFRC  --   --  0.19* <0.10*  CREATININE 0.96 0.86 0.88 0.95    Estimated Creatinine Clearance: 31.8 mL/min (by C-G formula based on SCr of 0.95 mg/dL).   Assessment: Christina Ortiz is an 87 year old female with history of paroxysmal A. fib on Xarelto 15 mg PO daily prior to admission. Patient has been NPO since admission with concern for aspiration and dysphagia. Patient has been getting prophylactic heparin subQ dosing up to this point. Pharmacy now consulted to start therapeutic heparin while Xarelto is on hold.   Heparin level is sub-therapeutic at 0.19 units/mL.  No issue with heparin infusion nor bleeding per RN.   4/14 AM update:  Heparin level was off for 3 hours this AM prior to lab draw Heparin is now re-started at 950 units/hr--will re-time heparin level   Goal of Therapy:  Heparin level 0.3-0.7 units/ml Monitor platelets by anticoagulation protocol: Yes   Plan:  Cont heparin at 950 units/hr given off time earlier tonight Re-time heparin level for 1000  5/14, PharmD, BCPS Clinical Pharmacist Phone: [phone]

## 2021-04-09 NOTE — TOC Progression Note (Signed)
Transition of Care [facility]) - Progression Note    Patient Details  Name: Christina Ortiz MRN: [id number] Date of Birth: 04/23/1929  Transition of Care [facility]) CM/SW Contact  Christina Ortiz, [place] Phone Number: 04/09/2021, 2:02 PM  Clinical Narrative:     CSW spoke with patient's daughter,Christina Ortiz-she confirmed she would like for the patient to return back to [facility] ALF w/ Hospice and she was agreeable to [facility].  CSW contacted [facility]- left message for RN Deanna Artis return call. CSW called [facility] Liaison- left voice message to return call  Antony Blackbird, MSW, LCSW Clinical Social Worker   Expected Discharge Plan: Assisted Living Barriers to Discharge: Continued Medical Work up  Expected Discharge Plan and Services Expected Discharge Plan: Assisted Living In-house Referral: Clinical Social Work                                             Social Determinants of Health (SDOH) Interventions    Readmission Risk Interventions No flowsheet data found.

## 2021-04-09 NOTE — Progress Notes (Signed)
Hypoglycemic Event  CBG: 50   Treatment: 4 ounces OJ, also restarted D10 fluids, pt. Pulled IV,                     And interrupted IV therapy for approximately 3 hrs. Symptoms: None  Follow-up CBG: Time: 01:15 CBG Result:57  Possible Reasons for Event: Interruption of IV therapy  Comments/MD notified: No    Anne Ng

## 2021-04-09 NOTE — Progress Notes (Signed)
Daily Progress Note   Patient Name: Christina Ortiz       Date: 04/09/2021 DOB: 1929-06-24  Age: 87 y.o. MRN#: [id number] Attending Physician: Lewie Chamber, MD Primary Care Physician: No primary care provider on file. Admit Date: 04/03/2021  Reason for Consultation/Follow-up: Establishing goals of care  Subjective: Patient sleeping. Per nursing, ate 75% of breakfast. Agitated last night and received haldol.   Length of Stay: 6  Current Medications: Scheduled Meds:  . chlorhexidine  15 mL Mouth Rinse BID  . Chlorhexidine Gluconate Cloth  6 each Topical Daily  . docusate  100 mg Per Tube BID  . levothyroxine  37.5 mcg Intravenous Daily  . mouth rinse  15 mL Mouth Rinse q12n4p  . polyethylene glycol  17 g Per Tube Daily  . Rivaroxaban  15 mg Oral Q supper    Continuous Infusions: . cefTRIAXone (ROCEPHIN)  IV 1 g (04/08/21 1811)    PRN Meds: docusate sodium, haloperidol lactate, hydrALAZINE, ondansetron (ZOFRAN) IV, polyethylene glycol, Resource ThickenUp Clear  Physical Exam Constitutional:      Comments: Sleeping, does not wake to voice, mitts on hands  Pulmonary:     Effort: Pulmonary effort is normal.             Vital Signs: BP (!) 152/79 (BP Location: Right Arm)   Pulse 80   Temp 97.6 F (36.4 C) (Axillary)   Resp 19   Ht 5\' 2"  (1.575 m)   Wt 54.2 kg   LMP  (LMP Unknown)   SpO2 100%   BMI 21.85 kg/m  SpO2: SpO2: 100 % O2 Device: O2 Device: Room Air O2 Flow Rate: O2 Flow Rate (L/min): 2 L/min  Intake/output summary:   Intake/Output Summary (Last 24 hours) at 04/09/2021 1250 Last data filed at 04/09/2021 0840 Gross per 24 hour  Intake 3148.56 ml  Output 1100 ml  Net 2048.56 ml   LBM: Last BM Date: 04/09/21 Baseline Weight: Weight: 45.4 kg Most recent weight:  Weight: 54.2 kg       Palliative Assessment/Data: PPS 40%    Flowsheet Rows   Flowsheet Row Most Recent Value  Intake Tab   Referral Department Critical care  Unit at Time of Referral Intermediate Care Unit  Palliative Care Primary Diagnosis Neurology  Date Notified 04/06/21  Palliative Care Type New Palliative care  Reason for referral Clarify Goals of Care  Date of Admission 04/03/21  Date first seen by Palliative Care 04/08/21  # of days Palliative referral response time 2 Day(s)  # of days IP prior to Palliative referral 3  Clinical Assessment   Palliative Performance Scale Score 40%  Psychosocial & Spiritual Assessment   Palliative Care Outcomes   Patient/Family meeting held? Yes  Who was at the meeting? daughter and SIL  Palliative Care Outcomes Clarified goals of care, Counseled regarding hospice, Provided end of life care assistance, Provided advance care planning, Provided psychosocial or spiritual support, Changed to focus on comfort, Transitioned to hospice      Patient Active Problem List   Diagnosis Date Noted  . Acute respiratory failure with hypoxia (HCC) 04/08/2021  . Hypomagnesemia 04/08/2021  . Acute metabolic encephalopathy 04/08/2021  . Pulmonary edema cardiac cause (HCC)  04/03/2021  . Hip fx, right, closed, initial encounter (HCC) 01/24/2021  . Right hip pain 01/24/2021  . Leukocytosis 01/24/2021  . Depression 01/24/2021  . Surgical counseling visit 12/10/2019  . History of esophageal stricture 11/02/2019  . Muscular deconditioning 11/01/2019  . Abnormal liver function tests   . Common bile duct calculus 10/16/2019  . Abnormal urinalysis 09/28/2019  . Adult failure to thrive syndrome 09/28/2019  . Dysphagia 09/06/2018  . Pain   . Closed fracture of femur, greater trochanter (HCC) 06/04/2018  . Hypoxia 06/03/2018  . Pain in right arm 04/10/2018  . Fall 03/31/2018  . Fracture of shaft of humerus 03/31/2018  . Disorder of skeletal muscle  03/31/2018  . Hypothermia 03/31/2018  . Healthcare maintenance 01/17/2018  . Nocturia 01/17/2018  . Macrocytosis 11/22/2017  . Acute bronchitis due to respiratory syncytial virus 11/09/2017  . Acute respiratory failure (HCC) 11/09/2017  . Hypertension 11/09/2017  . Paroxysmal atrial fibrillation (HCC) 11/09/2017  . Coronary artery disease 11/09/2017  . CKD (chronic kidney disease) stage 2, GFR 60-89 ml/min 11/09/2017  . Hypothyroidism 11/09/2017  . Dyspnea 11/08/2017  . Upper respiratory infection 11/08/2017  . Serum creatinine raised 06/21/2017  . Ankle edema 06/21/2017  . Diarrhea 05/25/2017  . Hypokalemia 05/25/2017  . Alcohol abuse 05/25/2017  . Recurrent depression (HCC) 03/30/2017  . Low back pain 03/30/2017  . Influenza 01/22/2017  . Coronary atherosclerosis 04/04/2015  . Hypertensive disorder 04/04/2015  . Hyperlipidemia 04/04/2015  . Ex-smoker 04/04/2015  . Chronic anticoagulation 04/04/2015    Palliative Care Assessment & Plan   HPI: 87 y.o. female  with past medical history of PAF anticoagulated on half dose Eliquis, CKD, HTN, HLD, hypothyroidism, GERD, and dementia  admitted on 04/03/2021 with AMS, SOB, and HTN. Patient admitted for acute respiratory failure precipitated by flash pulmonary edema, aspiration event, with underlying history of diastolic CHF, hypertensive urgency. Also with dysphagia - made NPO with ongoing aspiration. PMT consulted to discuss GOC.   Assessment: F/u with patient today. Tolerating diet. SLP f/u noted. 75% of breakfast consumed.  Attempted to call daughter - no answer - voicemail left with call back number. Will be available to speak with her.   Recommendations/Plan: Goals clear to continue diet - accept risk of aspiration, return to ALF with hospice  Goals of Care and Additional Recommendations: Limitations on Scope of Treatment: No Artificial Feeding  Code Status: DNR  Prognosis:  < 6 months  Discharge Planning: ALF with  hospice  Care plan was discussed with RN  Thank you for allowing the Palliative Medicine Team to assist in the care of this patient.   Total Time 15 minutes Prolonged Time Billed  no       Greater than 50%  of this time was spent counseling and coordinating care related to the above assessment and plan.  Gerlean Ren, DNP, [facility] Palliative Medicine Team Team Phone # [phone]  Pager [phone]

## 2021-04-09 NOTE — Discharge Instructions (Signed)

## 2021-04-09 NOTE — Progress Notes (Signed)
  Speech Language Pathology Treatment: Dysphagia  Patient Details Name: Christina Ortiz MRN: [id number] DOB: 06-28-1929 Today's Date: 04/09/2021 Time: 5621-3086 SLP Time Calculation (min) (ACUTE ONLY): 20 min  Assessment / Plan / Recommendation Clinical Impression  Pt was seen for f/u after decision on previous date to accept aspiration risk and initiate PO intake, with diet of Dys 1 (puree) solids and thin liquids started. Nursing staff reported that she ate 75% of her breakfast tray this morning. Upon SLP arrival pt had a mild wet vocal quality with occasional throat clearing noted, so she was cued to cough and try to clear her throat. SLP also provided consistent cues to cough intermittently throughout PO trials as a precaution to reduce but not eliminate aspiration risk. Daughter was not present yet for in-person education, so SLP provided written information for her and spoke with her via phone to answer questions.   For now, would continue diet with known aspiration risk. Depending on pt/family wishes, pt can upgrade her solids - particularly if this is a limiting factor in her ability to return to her ALF if this remains a goal. Would encourage careful monitoring with more solid foods to ensure that she is masticating them, and start with foods that are still slightly softer and that can be cut up into smaller pieces. Will continue to follow.    HPI HPI: Pt is an 87 y.o. female with acute hypoxemic respiratory failure secondary to potential aspiration episode with vomiting. Also presents with sepsis secondary to presumed UTI. CXR 4/11: Worsening patchy airspace disease in the left mid lung and right lower lobe. Esophagram (2019): Significant distal esophageal stricture above a moderate-sized hiatal hernia; Esophageal dysmotility. PMH: GERD, HTN, hiatal hernia, gastritis      SLP Plan  Continue with current plan of care       Recommendations  Diet recommendations: Dysphagia 1  (puree);Thin liquid (can upgrade per pt/family preference as they are accepting risks of aspiration) Liquids provided via: Cup;Straw Medication Administration: Crushed with puree Supervision: Staff to assist with self feeding;Full supervision/cueing for compensatory strategies Compensations: Slow rate;Small sips/bites;Minimize environmental distractions;Hard cough after swallow Postural Changes and/or Swallow Maneuvers: Seated upright 90 degrees                Oral Care Recommendations: Oral care QID Follow up Recommendations: Other (comment) (return to ALF) SLP Visit Diagnosis: Dysphagia, oropharyngeal phase (R13.12) Plan: Continue with current plan of care       GO                Mahala Menghini., M.A. CCC-SLP Acute Rehabilitation Services Pager [phone] Office [phone]  04/09/2021, 10:51 AM

## 2021-04-10 DIAGNOSIS — R5381 Other malaise: Secondary | ICD-10-CM

## 2021-04-10 DIAGNOSIS — Z515 Encounter for palliative care: Secondary | ICD-10-CM | POA: Diagnosis not present

## 2021-04-10 DIAGNOSIS — G9341 Metabolic encephalopathy: Secondary | ICD-10-CM

## 2021-04-10 DIAGNOSIS — J9601 Acute respiratory failure with hypoxia: Secondary | ICD-10-CM | POA: Diagnosis not present

## 2021-04-10 DIAGNOSIS — R1310 Dysphagia, unspecified: Secondary | ICD-10-CM | POA: Diagnosis not present

## 2021-04-10 DIAGNOSIS — Z7189 Other specified counseling: Secondary | ICD-10-CM | POA: Diagnosis not present

## 2021-04-10 LAB — CBC WITH DIFFERENTIAL/PLATELET
Abs Immature Granulocytes: 0.36 10*3/uL — ABNORMAL HIGH (ref 0.00–0.07)
Basophils Absolute: 0 10*3/uL (ref 0.0–0.1)
Basophils Relative: 0 %
Eosinophils Absolute: 0.1 10*3/uL (ref 0.0–0.5)
Eosinophils Relative: 1 %
HCT: 46.2 % — ABNORMAL HIGH (ref 36.0–46.0)
Hemoglobin: 15.4 g/dL — ABNORMAL HIGH (ref 12.0–15.0)
Immature Granulocytes: 4 %
Lymphocytes Relative: 10 %
Lymphs Abs: 1 10*3/uL (ref 0.7–4.0)
MCH: 32.6 pg (ref 26.0–34.0)
MCHC: 33.3 g/dL (ref 30.0–36.0)
MCV: 97.7 fL (ref 80.0–100.0)
Monocytes Absolute: 1.1 10*3/uL — ABNORMAL HIGH (ref 0.1–1.0)
Monocytes Relative: 11 %
Neutro Abs: 7.6 10*3/uL (ref 1.7–7.7)
Neutrophils Relative %: 74 %
Platelets: 225 10*3/uL (ref 150–400)
RBC: 4.73 MIL/uL (ref 3.87–5.11)
RDW: 13.2 % (ref 11.5–15.5)
WBC: 10.2 10*3/uL (ref 4.0–10.5)
nRBC: 0 % (ref 0.0–0.2)

## 2021-04-10 LAB — GLUCOSE, CAPILLARY
Glucose-Capillary: 20 mg/dL — CL (ref 70–99)
Glucose-Capillary: 33 mg/dL — CL (ref 70–99)
Glucose-Capillary: 78 mg/dL (ref 70–99)
Glucose-Capillary: 54 mg/dL — ABNORMAL LOW (ref 70–99)
Glucose-Capillary: 71 mg/dL (ref 70–99)
Glucose-Capillary: 139 mg/dL — ABNORMAL HIGH (ref 70–99)
Glucose-Capillary: 142 mg/dL — ABNORMAL HIGH (ref 70–99)
Glucose-Capillary: 121 mg/dL — ABNORMAL HIGH (ref 70–99)
Glucose-Capillary: 110 mg/dL — ABNORMAL HIGH (ref 70–99)

## 2021-04-10 LAB — BASIC METABOLIC PANEL
Anion gap: 7 (ref 5–15)
BUN: 19 mg/dL (ref 8–23)
CO2: 28 mmol/L (ref 22–32)
Calcium: 9.3 mg/dL (ref 8.9–10.3)
Chloride: 100 mmol/L (ref 98–111)
Creatinine, Ser: 0.91 mg/dL (ref 0.44–1.00)
GFR, Estimated: >60 mL/min (ref >60)
Glucose, Bld: 76 mg/dL (ref 70–99)
Potassium: 4 mmol/L (ref 3.5–5.1)
Sodium: 135 mmol/L (ref 135–145)

## 2021-04-10 LAB — MAGNESIUM: Magnesium: 1.8 mg/dL (ref 1.7–2.4)

## 2021-04-10 NOTE — Progress Notes (Signed)
Pt resting comfortably with Hr 94 RR 18 Spo2 97 on room air . No bipap needed at this time.

## 2021-04-10 NOTE — TOC Progression Note (Signed)
Transition of Care [facility]) - Progression Note    Patient Details  Name: Christina Ortiz MRN: [id number] Date of Birth: [birth year]  Transition of Care [facility]) CM/SW Contact  Eduard Roux, [place] Phone Number: 04/10/2021, 2:17 PM  Clinical Narrative:     CSW spoke with patient's daughter- informed per discussion with medical team-concerns were expressed regarding patient returning back to ALF. CSW requested MD to contact the patient's daughter.  Patient's daughter states she was updated by MD -she is agreeable to seeking placement at SNF.CSW explained the SNF process. Her preferred SNF is [facility]. She states she has been there before. CSW explained she may be in co-pay status. Family states no questions or concerns at this time.   CSW will provide bed offers once available.  CSW will continue to follow and assist with discharge planning.  Antony Blackbird, MSW, LCSW Clinical Social Worker   Expected Discharge Plan: Assisted Living Barriers to Discharge: Continued Medical Work up  Expected Discharge Plan and Services Expected Discharge Plan: Assisted Living In-house Referral: Clinical Social Work                                             Social Determinants of Health (SDOH) Interventions    Readmission Risk Interventions No flowsheet data found.

## 2021-04-10 NOTE — NC FL2 (Signed)
[place] MEDICAID FL2 LEVEL OF CARE SCREENING TOOL     IDENTIFICATION  Patient Name: Christina Ortiz Birthdate: 04/01/1929 Sex: Christina Admission Date (Current Location): 04/03/2021  [place] [facility] and [place] Number:  Producer, television/film/video and Address:  [facility], [address], [place], [place] [address]      Provider Number: [id number]  Attending Physician Name and Address:  Lewie Chamber, MD  Relative Name and Phone Number:       Current Level of Care: Hospital Recommended Level of Care: Skilled Nursing Facility Prior Approval Number:    Date Approved/Denied:   PASRR Number:    Discharge Plan: SNF    Current Diagnoses: Patient Active Problem List   Diagnosis Date Noted  . Acute respiratory failure with hypoxia (HCC) 04/08/2021  . Hypomagnesemia 04/08/2021  . Acute metabolic encephalopathy 04/08/2021  . Pulmonary edema cardiac cause (HCC) 04/03/2021  . Hip fx, right, closed, initial encounter (HCC) 01/24/2021  . Right hip pain 01/24/2021  . Leukocytosis 01/24/2021  . Depression 01/24/2021  . Surgical counseling visit 12/10/2019  . History of esophageal stricture 11/02/2019  . Muscular deconditioning 11/01/2019  . Abnormal liver function tests   . Common bile duct calculus 10/16/2019  . Abnormal urinalysis 09/28/2019  . Adult failure to thrive syndrome 09/28/2019  . Dysphagia 09/06/2018  . Pain   . Closed fracture of femur, greater trochanter (HCC) 06/04/2018  . Hypoxia 06/03/2018  . Pain in right arm 04/10/2018  . Fall 03/31/2018  . Fracture of shaft of humerus 03/31/2018  . Disorder of skeletal muscle 03/31/2018  . Hypothermia 03/31/2018  . Healthcare maintenance 01/17/2018  . Nocturia 01/17/2018  . Macrocytosis 11/22/2017  . Acute bronchitis due to respiratory syncytial virus 11/09/2017  . Acute respiratory failure (HCC) 11/09/2017  . Hypertension 11/09/2017  . Paroxysmal atrial fibrillation (HCC) 11/09/2017  . Coronary artery  disease 11/09/2017  . CKD (chronic kidney disease) stage 2, GFR 60-89 ml/min 11/09/2017  . Hypothyroidism 11/09/2017  . Dyspnea 11/08/2017  . Upper respiratory infection 11/08/2017  . Serum creatinine raised 06/21/2017  . Ankle edema 06/21/2017  . Diarrhea 05/25/2017  . Hypokalemia 05/25/2017  . Alcohol abuse 05/25/2017  . Recurrent depression (HCC) 03/30/2017  . Low back pain 03/30/2017  . Influenza 01/22/2017  . Coronary atherosclerosis 04/04/2015  . Hypertensive disorder 04/04/2015  . Hyperlipidemia 04/04/2015  . Ex-smoker 04/04/2015  . Chronic anticoagulation 04/04/2015    Orientation RESPIRATION BLADDER Height & Weight        Normal Incontinent Weight: 118 lb 13.3 oz (53.9 kg) Height:  5\' 2"  (157.5 cm)  BEHAVIORAL SYMPTOMS/MOOD NEUROLOGICAL BOWEL NUTRITION STATUS      Incontinent Diet (Dysphagia 1 (puree);Thin liquid)  AMBULATORY STATUS COMMUNICATION OF NEEDS Skin     Verbally                         Personal Care Assistance Level of Assistance  Bathing,Feeding,Dressing Bathing Assistance: Limited assistance Feeding assistance: Limited assistance Dressing Assistance: Limited assistance     Functional Limitations Info  Sight,Hearing,Speech Sight Info: Impaired Hearing Info: Adequate Speech Info: Adequate    SPECIAL CARE FACTORS FREQUENCY  Speech therapy,PT (By licensed PT),OT (By licensed OT)     PT Frequency: 5x per week OT Frequency: 5x per week     Speech Therapy Frequency: 2x per week      Contractures Contractures Info: Not present    Additional Factors Info  Code Status,Allergies,Psychotropic Code Status Info: DNR Allergies  Info: NKA Psychotropic Info: Wellbutrin, Paxil         Current Medications (04/10/2021):  This is the current hospital active medication list Current Facility-Administered Medications  Medication Dose Route Frequency Provider Last Rate Last Admin  . chlorhexidine (PERIDEX) 0.12 % solution 15 mL  15 mL Mouth Rinse  BID Luciano Cutter, MD   15 mL at 04/10/21 1055  . Chlorhexidine Gluconate Cloth 2 % PADS 6 each  6 each Topical Daily Luciano Cutter, MD   6 each at 04/10/21 1055  . docusate (COLACE) 50 MG/5ML liquid 100 mg  100 mg Per Tube BID Luciano Cutter, MD      . docusate sodium (COLACE) capsule 100 mg  100 mg Oral BID PRN Raymon Mutton F, NP      . haloperidol lactate (HALDOL) injection 5 mg  5 mg Intravenous Q6H PRN Luciano Cutter, MD   5 mg at 04/09/21 0033  . hydrALAZINE (APRESOLINE) injection 10 mg  10 mg Intravenous Q6H PRN Raymon Mutton F, NP   10 mg at 04/10/21 0430  . levothyroxine (SYNTHROID) tablet 75 mcg  75 mcg Oral Q0600 Lewie Chamber, MD   75 mcg at 04/10/21 [phone]  . MEDLINE mouth rinse  15 mL Mouth Rinse q12n4p Luciano Cutter, MD   15 mL at 04/09/21 1410  . ondansetron (ZOFRAN) injection 4 mg  4 mg Intravenous Q6H PRN Raymon Mutton F, NP      . polyethylene glycol (MIRALAX / GLYCOLAX) packet 17 g  17 g Oral Daily PRN Raymon Mutton F, NP      . polyethylene glycol (MIRALAX / GLYCOLAX) packet 17 g  17 g Per Tube Daily Luciano Cutter, MD      . Resource ThickenUp Clear   Oral PRN Luciano Cutter, MD      . Rivaroxaban [name]) tablet 15 mg  15 mg Oral Q supper Lewie Chamber, MD   15 mg at 04/09/21 1731     Discharge Medications: Please see discharge summary for a list of discharge medications.  Relevant Imaging Results:  Relevant Lab Results:   Additional Information SSN [id number]  Pfizer COVID-19 Vaccine 02/09/2020 , 01/19/2020 pallitive to follow at [facility]  Eduard Roux, LCSW

## 2021-04-10 NOTE — Progress Notes (Signed)
Progress Note    Christina Ortiz   ZOX:[id number]  DOB: 07/03/1929  DOA: 04/03/2021     7  PCP: No primary care provider on file.  CC: SOB  Hospital Course: Patient is a 87 year old female with history of paroxysmal A. fib on Eliquis, CKD, hypertension, hyperlipidemia, hypothyroidism, GERD, mild dementia presented from local ALF with altered mental status, shortness of breath and severe hypertension.  Patient's daughter had reported altered mental status with an episode of vomiting followed by hypoxia later severe hypertension. In ED, patient was hypertensive with BP 203/137, O2 sats appropriate for 4 L [place].  Patient was placed on nitro drip for hypertensive urgency.  However due to worsening mental status, hypoxic and hypotension, nitro drip was discontinued, placed BiPAP and patient was admitted to ICU for further work-up.  Interval History:  No events overnight but patient appears more weak and deconditioned this morning.  Plan has been for patient to [facility] at discharge however she does not appear to be safe for assisted living at this point.  Have ordered OT to also evaluate; PT agrees with SNF. For now, will remain in hospital until further safe disposition plan.  ROS: Constitutional: negative for chills and fevers, Respiratory: negative for cough, Cardiovascular: negative for chest pain and Gastrointestinal: negative for abdominal pain  Assessment & Plan: Acute respiratory failure with hypoxia - Multifactorial, precipitated by flash pulmonary edema, aspiration event, with underlying history of diastolic CHF, hypertensive urgency -Off the BiPAP, chest x-ray showed persistent cardiomegaly with left base atelectasis.  - O2 sats 96- 99% on 2 L O2 via [place], wean as tolerated.  Now on room air -2D echo 4/10 showed EF 60-65%, diastolic function indeterminate.  Mild to moderate mitral valve regurgitation -DC Lasix. -Aspiration precautions  Hypokalemia,  hypomagnesemia -Aggressively replaced IV  Hypoglycemia -Has been on D10 infusion.  Questionable reliability with CBG checks however glucose has been low on BMP at times.  Diet has now been resumed (dysphagia) -Discontinue D10 infusion, continue CBG checks for now.  If remains stable, can further back off -Glucose levels remaining stable on 04/10/2021 -Continue dysphagia diet  Acute metabolic encephalopathy -Multifactorial in the setting of hypertensive urgency, delirium, infection -Currently alert and oriented, improving -Delirium and aspiration precautions  Dysphagia likely due to aspiration event -Family discussion held with palliative care.  Plan is for resuming dysphagia diet and continuing aspiration precautions; family understands the risk -Continue aspiration precautions -Plan [facility] with hospice in place however as of 4/15 she doesn't appear to be safe enough for ALF at this point; big decline since yesterday phyically  Hypothyroidism -TSH 4.7, free T4 1.1 -Patient on Synthroid 75 MCG daily outpatient - resume oral synthroid  History of paroxysmal atrial fibrillation -Rate controlled - d/c heparin drip now that taking PO again - resume xarelto    Old records reviewed in assessment of this patient   DVT prophylaxis: SCDs Start: 04/03/21 1723 Rivaroxaban (XARELTO) tablet 15 mg Heparin drip: 04/08/21 > 4/14. Resume xarelto 4/14  Code Status:   Code Status: DNR Family Communication:   Disposition Plan: Status is: Inpatient  Remains inpatient appropriate because:Inpatient level of care appropriate due to severity of illness   Dispo: The patient is from: Home              Anticipated d/c is to: [facility] with hospice vs SNF (currently too weak for ALF)  Patient currently is medically stable to d/c. pending a safe dispo   Difficult to place patient No  Risk of unplanned readmission score: Unplanned Admission- Pilot do not  use: 19   Objective: Blood pressure (!) 149/70, pulse 98, temperature 97.7 F (36.5 C), temperature source Oral, resp. rate 20, height 5\' 2"  (1.575 m), weight 53.9 kg, SpO2 97 %.  Examination: General appearance: much more lethargic and weak appearing  Head: Normocephalic, without obvious abnormality, atraumatic Eyes: EOMI Lungs: clear to auscultation bilaterally Heart: irregularly irregular rhythm and S1, S2 normal Abdomen: normal findings: bowel sounds normal and soft, non-tender Extremities: edema noted in UE's Skin: mobility and turgor normal Neurologic: Grossly normal  Consultants:   PCM  Procedures:     Data Reviewed: I have personally reviewed following labs and imaging studies Results for orders placed or performed during the hospital encounter of 04/03/21 (from the past 24 hour(s))  Glucose, capillary     Status: None   Collection Time: 04/09/21  1:35 PM  Result Value Ref Range   Glucose-Capillary 80 70 - 99 mg/dL  Glucose, capillary     Status: Abnormal   Collection Time: 04/09/21  4:13 PM  Result Value Ref Range   Glucose-Capillary 33 (LL) 70 - 99 mg/dL  Glucose, capillary     Status: None   Collection Time: 04/09/21  4:17 PM  Result Value Ref Range   Glucose-Capillary 75 70 - 99 mg/dL   Comment 1 Notify RN    Comment 2 Document in Chart   Glucose, capillary     Status: Abnormal   Collection Time: 04/09/21  8:10 PM  Result Value Ref Range   Glucose-Capillary 68 (L) 70 - 99 mg/dL  Glucose, capillary     Status: None   Collection Time: 04/09/21  9:09 PM  Result Value Ref Range   Glucose-Capillary 82 70 - 99 mg/dL  Glucose, capillary     Status: None   Collection Time: 04/09/21 11:53 PM  Result Value Ref Range   Glucose-Capillary 81 70 - 99 mg/dL  Basic metabolic panel     Status: None   Collection Time: 04/10/21  3:15 AM  Result Value Ref Range   Sodium 135 135 - 145 mmol/L   Potassium 4.0 3.5 - 5.1 mmol/L   Chloride 100 98 - 111 mmol/L   CO2 28 22 -  32 mmol/L   Glucose, Bld 76 70 - 99 mg/dL   BUN 19 8 - 23 mg/dL   Creatinine, Ser 04/12/21 0.44 - 1.00 mg/dL   Calcium 9.3 8.9 - 5.63 mg/dL   GFR, Estimated 89.3 >73 mL/min   Anion gap 7 5 - 15  CBC with Differential/Platelet     Status: Abnormal   Collection Time: 04/10/21  3:15 AM  Result Value Ref Range   WBC 10.2 4.0 - 10.5 K/uL   RBC 4.73 3.87 - 5.11 MIL/uL   Hemoglobin 15.4 (H) 12.0 - 15.0 g/dL   HCT 04/12/21 (H) 87.6 - 81.1 %   MCV 97.7 80.0 - 100.0 fL   MCH 32.6 26.0 - 34.0 pg   MCHC 33.3 30.0 - 36.0 g/dL   RDW 57.2 62.0 - 35.5 %   Platelets 225 150 - 400 K/uL   nRBC 0.0 0.0 - 0.2 %   Neutrophils Relative % 74 %   Neutro Abs 7.6 1.7 - 7.7 K/uL   Lymphocytes Relative 10 %   Lymphs Abs 1.0 0.7 - 4.0 K/uL   Monocytes Relative 11 %  Monocytes Absolute 1.1 (H) 0.1 - 1.0 K/uL   Eosinophils Relative 1 %   Eosinophils Absolute 0.1 0.0 - 0.5 K/uL   Basophils Relative 0 %   Basophils Absolute 0.0 0.0 - 0.1 K/uL   Immature Granulocytes 4 %   Abs Immature Granulocytes 0.36 (H) 0.00 - 0.07 K/uL  Magnesium     Status: None   Collection Time: 04/10/21  3:15 AM  Result Value Ref Range   Magnesium 1.8 1.7 - 2.4 mg/dL  Glucose, capillary     Status: None   Collection Time: 04/10/21  4:10 AM  Result Value Ref Range   Glucose-Capillary 78 70 - 99 mg/dL  Glucose, capillary     Status: Abnormal   Collection Time: 04/10/21  8:11 AM  Result Value Ref Range   Glucose-Capillary 54 (L) 70 - 99 mg/dL   Comment 1 Notify RN    Comment 2 Document in Chart   Glucose, capillary     Status: None   Collection Time: 04/10/21  8:13 AM  Result Value Ref Range   Glucose-Capillary 71 70 - 99 mg/dL   Comment 1 Notify RN    Comment 2 Document in Chart   Glucose, capillary     Status: Abnormal   Collection Time: 04/10/21 12:31 PM  Result Value Ref Range   Glucose-Capillary 139 (H) 70 - 99 mg/dL   Comment 1 Notify RN    Comment 2 Document in Chart     Recent Results (from the past 240 hour(s))  Resp  Panel by RT-PCR (Flu A&B, Covid) Nasopharyngeal Swab     Status: None   Collection Time: 04/03/21  1:54 PM   Specimen: Nasopharyngeal Swab; Nasopharyngeal(NP) swabs in vial transport medium  Result Value Ref Range Status   SARS Coronavirus 2 by RT PCR NEGATIVE NEGATIVE Final    Comment: (NOTE) SARS-CoV-2 target nucleic acids are NOT DETECTED.  The SARS-CoV-2 RNA is generally detectable in upper respiratory specimens during the acute phase of infection. The lowest concentration of SARS-CoV-2 viral copies this assay can detect is 138 copies/mL. A negative result does not preclude SARS-Cov-2 infection and should not be used as the sole basis for treatment or other patient management decisions. A negative result may occur with  improper specimen collection/handling, submission of specimen other than nasopharyngeal swab, presence of viral mutation(s) within the areas targeted by this assay, and inadequate number of viral copies(<138 copies/mL). A negative result must be combined with clinical observations, patient history, and epidemiological information. The expected result is Negative.  Fact Sheet for Patients:  BloggerCourse.com  Fact Sheet for Healthcare Providers:  SeriousBroker.it  This test is no t yet approved or cleared by the [place] FDA and  has been authorized for detection and/or diagnosis of SARS-CoV-2 by FDA under an Emergency Use Authorization (EUA). This EUA will remain  in effect (meaning this test can be used) for the duration of the COVID-19 declaration under Section 564(b)(1) of the Act, 21 U.S.C.section 360bbb-3(b)(1), unless the authorization is terminated  or revoked sooner.       Influenza A by PCR NEGATIVE NEGATIVE Final   Influenza B by PCR NEGATIVE NEGATIVE Final    Comment: (NOTE) The Xpert Xpress SARS-CoV-2/FLU/RSV plus assay is intended as an aid in the diagnosis of influenza from Nasopharyngeal  swab specimens and should not be used as a sole basis for treatment. Nasal washings and aspirates are unacceptable for Xpert Xpress SARS-CoV-2/FLU/RSV testing.  Fact Sheet for Patients: BloggerCourse.com  Fact Sheet  for Healthcare Providers: SeriousBroker.it  This test is not yet approved or cleared by the [place] and has been authorized for detection and/or diagnosis of SARS-CoV-2 by FDA under an Emergency Use Authorization (EUA). This EUA will remain in effect (meaning this test can be used) for the duration of the COVID-19 declaration under Section 564(b)(1) of the Act, 21 U.S.C. section 360bbb-3(b)(1), unless the authorization is terminated or revoked.  Performed at [facility], [address], [place], [place] [address]   MRSA PCR Screening     Status: Abnormal   Collection Time: 04/03/21  8:12 PM   Specimen: Nasal Mucosa; Nasopharyngeal  Result Value Ref Range Status   MRSA by PCR POSITIVE (A) NEGATIVE Final    Comment:        The GeneXpert MRSA Assay (FDA approved for NASAL specimens only), is one component of a comprehensive MRSA colonization surveillance program. It is not intended to diagnose MRSA infection nor to guide or monitor treatment for MRSA infections. RESULT CALLED TO, READ BACK BY AND VERIFIED WITHChalmers Guest RN [id number] 04/03/21 A BROWNING Performed at [facility], [address] [place], [place] [address]   Culture, blood (routine x 2)     Status: None   Collection Time: 04/03/21  8:14 PM   Specimen: BLOOD RIGHT ARM  Result Value Ref Range Status   Specimen Description BLOOD RIGHT ARM  Final   Special Requests   Final    BOTTLES DRAWN AEROBIC ONLY Blood Culture results may not be optimal due to an inadequate volume of blood received in culture bottles   Culture   Final    NO GROWTH 5 DAYS Performed at [facility], [address], [place], [place] [address]    Report  Status 04/08/2021 FINAL  Final  Culture, blood (routine x 2)     Status: None   Collection Time: 04/03/21  8:25 PM   Specimen: BLOOD LEFT ARM  Result Value Ref Range Status   Specimen Description BLOOD LEFT ARM  Final   Special Requests   Final    BOTTLES DRAWN AEROBIC AND ANAEROBIC Blood Culture results may not be optimal due to an inadequate volume of blood received in culture bottles   Culture   Final    NO GROWTH 5 DAYS Performed at [facility], [address] [place], [place] [address]    Report Status 04/08/2021 FINAL  Final  Urine Culture     Status: None   Collection Time: 04/03/21  8:50 PM   Specimen: Urine, Random  Result Value Ref Range Status   Specimen Description URINE, RANDOM  Final   Special Requests NONE  Final   Culture   Final    NO GROWTH Performed at [facility], [address], [place], [place] [address]    Report Status 04/05/2021 FINAL  Final     Radiology Studies: No results found. DG Swallowing Func-Speech Pathology  Final Result    DG CHEST PORT 1 VIEW  Final Result    CT Head Wo Contrast  Final Result    DG Chest Port 1 View  Final Result      Scheduled Meds: . chlorhexidine  15 mL Mouth Rinse BID  . Chlorhexidine Gluconate Cloth  6 each Topical Daily  . docusate  100 mg Per Tube BID  . levothyroxine  75 mcg Oral Q0600  . mouth rinse  15 mL Mouth Rinse q12n4p  .  polyethylene glycol  17 g Per Tube Daily  . Rivaroxaban  15 mg Oral Q supper   PRN Meds: docusate sodium, haloperidol lactate, hydrALAZINE, ondansetron (ZOFRAN) IV, polyethylene glycol, Resource ThickenUp Clear Continuous Infusions:    LOS: 7 days  Time spent: Greater than 50% of the 35 minute visit was spent in counseling/coordination of care for the patient as laid out in the A&P.   Lewie Chamber, MD [facility] 04/10/2021, 1:02 PM

## 2021-04-10 NOTE — TOC Progression Note (Signed)
Transition of Care [facility]) - Progression Note    Patient Details  Name: Christina Ortiz MRN: [id number] Date of Birth: 10-11-1929  [facility] of [facility]) CM/SW Contact  Eduard Roux, [place] Phone Number: 04/10/2021, 3:13 PM  Clinical Narrative:     TOC  following for final disposition plan.    Antony Blackbird, MSW, LCSW Clinical Social Worker   Expected Discharge Plan: Assisted Living Barriers to Discharge: Continued Medical Work up  Expected Discharge Plan and Services Expected Discharge Plan: Assisted Living In-house Referral: Clinical Social Work                                             Social Determinants of Health (SDOH) Interventions    Readmission Risk Interventions No flowsheet data found.

## 2021-04-10 NOTE — Progress Notes (Addendum)
Physical Therapy Treatment Patient Details Name: Christina Ortiz MRN: [id number] DOB: 07-08-1929 Today's Date: 04/10/2021    History of Present Illness 87 yo admitted 4/8 with AMS from ALF with SOB and aspiration with HTN urgency. PMHx: PAF, CKD, HTN, HLD, hypothyroidism, GERD, dementia, Rt humerus fx 2019.    PT Comments    Pt received in supine, pleasantly confused but agreeable to therapy session and with good participation and fair tolerance for bed mobility and transfer training. Pt with decreased standing balance and heavy posterior bias during standing tasks, unable to progress gait beyond short trial to chair due to need for maxA and poor management of RW. Pt tachycardic to >140 bpm with exertion and RR to 30 rpm, RN notified, HR returns to 100-110 bpm resting in chair. Pt performed supine/sidelying exercises for UE/LE strengthening with good tolerance but needs multimodal cues 2/2 cognition. Pt continues to benefit from PT services to progress toward functional mobility goals. Continue to recommend SNF.  Follow Up Recommendations  SNF;Supervision/Assistance - 24 hour     Equipment Recommendations  Wheelchair (measurements PT);Other (comment) (18" wc with elevating removable leg rests)    Recommendations for Other Services       Precautions / Restrictions Precautions Precautions: Fall Restrictions Weight Bearing Restrictions: No    Mobility  Bed Mobility Overal bed mobility: Needs Assistance Bed Mobility: Rolling;Sidelying to Sit Rolling: Min guard Sidelying to sit: Min assist;+2 for safety/equipment       General bed mobility comments: needs transfer pad assist for bil hip translation to EOB and minA with bed rail assist also for trunk rise from flat HOB    Transfers Overall transfer level: Needs assistance Equipment used: Rolling walker (2 wheeled) Transfers: Sit to/from Stand Sit to Stand: Mod assist;+2 safety/equipment;From elevated surface          General transfer comment: min assist to stand from bed with assist for anterior translation and rise with max VC for hand placement to pivot to chair with assist for balance. Min assist to rise from [facility] with posteror lean and max cues for hand placement on RW and anterior translation  Ambulation/Gait Ambulation/Gait assistance: Max assist;+2 safety/equipment Gait Distance (Feet): 6 Feet Assistive device: Rolling walker (2 wheeled) Gait Pattern/deviations: Trunk flexed;Shuffle;Leaning posteriorly;Narrow base of support;Step-to pattern Gait velocity: <0.2 m/s Gait velocity interpretation: <1.8 ft/sec, indicate of risk for recurrent falls General Gait Details: heavy posterior lean throughout and pt with difficulty centering base of support over feet and poor management of RW, needed maxA +1 with second person standing by for line mgmt/safety; deferred longer trial 2/2 tachycardia to 140 bpm with short bedside gait task. SpO2 WNL on RA. May do better with B HHA next session due to cognition.   Stairs             Wheelchair Mobility    Modified Rankin (Stroke Patients Only)       Balance Overall balance assessment: Needs assistance Sitting-balance support: No upper extremity supported;Feet supported Sitting balance-Leahy Scale: Fair Sitting balance - Comments: sitting EOB unsupported/supervision for safety   Standing balance support: Bilateral upper extremity supported Standing balance-Leahy Scale: Zero Standing balance comment: pt with heavy posterior bias in standing with and without UE support; needs external assist throughout            Cognition Arousal/Alertness: Awake/alert Behavior During Therapy: Flat affect Overall Cognitive Status: Impaired/Different from baseline Area of Impairment: Memory;Orientation;Attention;Safety/judgement  Orientation Level: Disoriented to;Time;Place;Situation Current Attention Level: Sustained Memory: Decreased  short-term memory   Safety/Judgement: Decreased awareness of safety;Decreased awareness of deficits     General Comments: pt incontinent of bowel and bladder on arrival and unaware. Pt not oriented other than to self. Delayed response to all commands and cues but follows 1-step commands well with increased time      Exercises General Exercises - Lower Extremity Ankle Circles/Pumps: AROM;Both;10 reps;Supine Heel Slides: AAROM;Both;10 reps;Supine Hip ABduction/ADduction: AAROM;Both;5 reps;Supine Other Exercises Other Exercises: BUE AAROM: elbow flex/ext and shoulder flexion in supine and UE pushing/pulling while in sidelying at rail x10 reps ea    General Comments General comments (skin integrity, edema, etc.): pt incontinent of bowels upon PTA arrival to room and nursing tech present to assist with hygiene; HR 104-110 bpm resting and to 140 bpm with exertion; RR 25-30 with exertion and no dizziness reported, did not otherwise assess BP.      Pertinent Vitals/Pain Pain Assessment: Faces Faces Pain Scale: No hurt Pain Intervention(s): Limited activity within patient's tolerance;Monitored during session;Repositioned           PT Goals (current goals can now be found in the care plan section) Acute Rehab PT Goals Patient Stated Goal: pt unable to state due to dementia but agreeable to mobility PT Goal Formulation: Patient unable to participate in goal setting Time For Goal Achievement: 04/21/21 Potential to Achieve Goals: Fair Progress towards PT goals: Progressing toward goals (slow progress due to cognition/unstable HR)    Frequency    Min 2X/week      PT Plan Current plan remains appropriate       AM-PAC PT "6 Clicks" Mobility   Outcome Measure  Help needed turning from your back to your side while in a flat bed without using bedrails?: A Little Help needed moving from lying on your back to sitting on the side of a flat bed without using bedrails?: A Little Help  needed moving to and from a bed to a chair (including a wheelchair)?: A Lot Help needed standing up from a chair using your arms (e.g., wheelchair or bedside chair)?: A Lot Help needed to walk in hospital room?: Total Help needed climbing 3-5 steps with a railing? : Total 6 Click Score: 12    End of Session Equipment Utilized During Treatment: Gait belt Activity Tolerance: Patient tolerated treatment well;Other (comment) (poor balance/cognitive deficit and tachycardia limiting gait distance) Patient left: in chair;with call bell/phone within reach;with chair alarm set;with nursing/sitter in room Nurse Communication: Mobility status;Precautions;Other (comment) (will need +2 for stand pivot back to bed, may need assist to eat lunch tray which just arrived) PT Visit Diagnosis: Other abnormalities of gait and mobility (R26.89);Difficulty in walking, not elsewhere classified (R26.2);Muscle weakness (generalized) (M62.81)     Time: 2637-8588 PT Time Calculation (min) (ACUTE ONLY): 26 min  Charges:  $Therapeutic Exercise: 8-22 mins $Therapeutic Activity: 8-22 mins                     Cleburne Savini P., PTA Acute Rehabilitation Services Pager: [phone] Office: [phone]   Angus Palms 04/10/2021, 1:56 PM

## 2021-04-10 NOTE — Progress Notes (Signed)
Daily Progress Note   Patient Name: Christina Ortiz       Date: 04/10/2021 DOB: 10-01-1929  Age: 87 y.o. MRN#: [id number] Attending Physician: Christina Chamber, MD Primary Care Physician: Christina Ortiz. Admit Date: 04/03/2021  Reason for Consultation/Follow-up: Establishing goals of care  Subjective: Tells me she does not feel well, c/o nausea. Family at bedside and feel she looks much weaker today.   Length of Stay: 7  Current Medications: Scheduled Meds:  . chlorhexidine  15 mL Mouth Rinse BID  . Chlorhexidine Gluconate Cloth  6 each Topical Daily  . docusate  100 mg Per Tube BID  . levothyroxine  75 mcg Oral Q0600  . mouth rinse  15 mL Mouth Rinse q12n4p  . polyethylene glycol  17 g Per Tube Daily  . Rivaroxaban  15 mg Oral Q supper    Continuous Infusions:   PRN Meds: docusate sodium, haloperidol lactate, hydrALAZINE, ondansetron (ZOFRAN) IV, polyethylene glycol, Resource ThickenUp Clear  Physical Exam Constitutional:      Comments: Up in chair, wakes to voice and responds to question  Pulmonary:     Effort: Pulmonary effort is normal.  Skin:    General: Skin is warm and dry.  Neurological:     Mental Status: She is disoriented.             Vital Signs: BP (!) 149/70 (BP Location: Left Arm)   Pulse 98   Temp 97.7 F (36.5 C) (Oral)   Resp 20   Ht 5\' 2"  (1.575 m)   Wt 53.9 kg   LMP  (LMP Unknown)   SpO2 97%   BMI 21.73 kg/m  SpO2: SpO2: 97 % O2 Device: O2 Device: Room Air O2 Flow Rate: O2 Flow Rate (L/min): 2 L/min  Intake/output summary:   Intake/Output Summary (Last 24 hours) at 04/10/2021 1506 Last data filed at 04/10/2021 0844 Gross per 24 hour  Intake 240 ml  Output 700 ml  Net -460 ml   LBM: Last BM Date: 04/09/21 Baseline Weight: Weight:  45.4 kg Most recent weight: Weight: 53.9 kg       Palliative Assessment/Data: PPS 40%    Flowsheet Rows   Flowsheet Row Most Recent Value  Intake Tab   Referral Department Critical care  Unit at Time of Referral Intermediate Care Unit  Palliative Care Primary Diagnosis Neurology  Date Notified 04/06/21  Palliative Care Type New Palliative care  Reason for referral Clarify Goals of Care  Date of Admission 04/03/21  Date first seen by Palliative Care 04/08/21  # of days Palliative referral response time 2 Day(s)  # of days IP prior to Palliative referral 3  Clinical Assessment   Palliative Performance Scale Score 40%  Psychosocial & Spiritual Assessment   Palliative Care Outcomes   Patient/Family meeting held? Yes  Who was at the meeting? daughter and Christina Ortiz  Palliative Care Outcomes Clarified goals of care, Counseled regarding hospice, Provided end of life care assistance, Provided advance care planning, Provided psychosocial or spiritual support, Changed to focus on comfort, Transitioned to hospice      Patient Active Problem List   Diagnosis Date Noted  . Acute respiratory failure with hypoxia (HCC)  04/08/2021  . Hypomagnesemia 04/08/2021  . Acute metabolic encephalopathy 04/08/2021  . Pulmonary edema cardiac cause (HCC) 04/03/2021  . Hip fx, right, closed, initial encounter (HCC) 01/24/2021  . Right hip pain 01/24/2021  . Leukocytosis 01/24/2021  . Depression 01/24/2021  . Surgical counseling visit 12/10/2019  . History of esophageal stricture 11/02/2019  . Muscular deconditioning 11/01/2019  . Abnormal liver function tests   . Common bile duct calculus 10/16/2019  . Abnormal urinalysis 09/28/2019  . Adult failure to thrive syndrome 09/28/2019  . Dysphagia 09/06/2018  . Pain   . Closed fracture of femur, greater trochanter (HCC) 06/04/2018  . Hypoxia 06/03/2018  . Pain in right arm 04/10/2018  . Fall 03/31/2018  . Fracture of shaft of humerus 03/31/2018  .  Disorder of skeletal muscle 03/31/2018  . Hypothermia 03/31/2018  . Healthcare maintenance 01/17/2018  . Nocturia 01/17/2018  . Macrocytosis 11/22/2017  . Acute bronchitis due to respiratory syncytial virus 11/09/2017  . Acute respiratory failure (HCC) 11/09/2017  . Hypertension 11/09/2017  . Paroxysmal atrial fibrillation (HCC) 11/09/2017  . Coronary artery disease 11/09/2017  . CKD (chronic kidney disease) stage 2, GFR 60-89 ml/min 11/09/2017  . Hypothyroidism 11/09/2017  . Dyspnea 11/08/2017  . Upper respiratory infection 11/08/2017  . Serum creatinine raised 06/21/2017  . Ankle edema 06/21/2017  . Diarrhea 05/25/2017  . Hypokalemia 05/25/2017  . Alcohol abuse 05/25/2017  . Recurrent depression (HCC) 03/30/2017  . Low back pain 03/30/2017  . Influenza 01/22/2017  . Coronary atherosclerosis 04/04/2015  . Hypertensive disorder 04/04/2015  . Hyperlipidemia 04/04/2015  . Ex-smoker 04/04/2015  . Chronic anticoagulation 04/04/2015    Palliative Care Assessment & Plan   HPI: 87 y.o. female  with past medical history of PAF anticoagulated on half dose Eliquis, CKD, HTN, HLD, hypothyroidism, GERD, and dementia  admitted on 04/03/2021 with AMS, SOB, and HTN. Patient admitted for acute respiratory failure precipitated by flash pulmonary edema, aspiration event, with underlying history of diastolic CHF, hypertensive urgency. Also with dysphagia - made NPO with ongoing aspiration. PMT consulted to discuss GOC.   Assessment: Family call with concerns of worsening weakness We discuss options moving forward: ALF with hospice vs SNF rehab. Family would really like to avoid rehab - feel she is too weak to participate nor would she want to go there away from her familiar environment at ALF. We discuss extra support she would receive from hospice at ALF.  We also discuss that Christina Ortiz may be more appropriate for inpatient hospice - unsure at this point. Will see how she does overnight and in AM. We  discuss possibility of pneumonia. They express understanding and again express desire to focus on her comfort and avoid aggressive medical interventions.  Discussed plan with family, Christina Ortiz, and social work.    Recommendations/Plan: Patient worse today, plan for ALF with hospice vs hospice facility Will reevaluate in AM  Goals of Care and Additional Recommendations: Limitations on Scope of Treatment: Christina Artificial Feeding  Code Status: DNR  Prognosis:  < 6 months  Discharge Planning: ALF with hospice  Care plan was discussed with RN  Thank you for allowing the Palliative Medicine Team to assist in the care of this patient.   Total Time 40 minutes Prolonged Time Billed  Christina       Greater than 50%  of this time was spent counseling and coordinating care related to the above assessment and plan.   Gerlean Ren, DNP, AGNP-C Palliative Medicine Team Team Phone # [phone]  Pager [phone]

## 2021-04-11 DIAGNOSIS — G9341 Metabolic encephalopathy: Secondary | ICD-10-CM | POA: Diagnosis not present

## 2021-04-11 DIAGNOSIS — R1310 Dysphagia, unspecified: Secondary | ICD-10-CM | POA: Diagnosis not present

## 2021-04-11 DIAGNOSIS — F0391 Unspecified dementia with behavioral disturbance: Secondary | ICD-10-CM | POA: Diagnosis not present

## 2021-04-11 DIAGNOSIS — Z515 Encounter for palliative care: Secondary | ICD-10-CM | POA: Diagnosis not present

## 2021-04-11 DIAGNOSIS — Z7189 Other specified counseling: Secondary | ICD-10-CM | POA: Diagnosis not present

## 2021-04-11 LAB — GLUCOSE, CAPILLARY
Glucose-Capillary: 108 mg/dL — ABNORMAL HIGH (ref 70–99)
Glucose-Capillary: 62 mg/dL — ABNORMAL LOW (ref 70–99)
Glucose-Capillary: 76 mg/dL (ref 70–99)

## 2021-04-11 LAB — RESP PANEL BY RT-PCR (FLU A&B, COVID) ARPGX2
Influenza A by PCR: NEGATIVE
Influenza B by PCR: NEGATIVE
SARS Coronavirus 2 by RT PCR: NEGATIVE

## 2021-04-11 LAB — C-PEPTIDE: C-Peptide: 10.3 ng/mL — ABNORMAL HIGH (ref 1.1–4.4)

## 2021-04-11 MED ORDER — ACETAMINOPHEN 325 MG PO TABS: 650.0000 mg | ORAL_TABLET | Freq: Four times a day (QID) | ORAL | Status: AC | PRN

## 2021-04-11 MED ORDER — VITAMIN D3 25 MCG (1000 UT) PO CAPS: 1000.0000 [IU] | ORAL_CAPSULE | Freq: Every morning | ORAL | Status: AC

## 2021-04-11 MED ORDER — LEVOTHYROXINE SODIUM 75 MCG PO TABS
75.0000 ug | ORAL_TABLET | Freq: Every day | ORAL | 3 refills | Status: AC
Start: 2021-04-11 — End: ?

## 2021-04-11 MED ORDER — FOLIC ACID 1 MG PO TABS: 1.0000 mg | ORAL_TABLET | Freq: Every day | ORAL | 0 refills | Status: AC

## 2021-04-11 MED ORDER — OMEPRAZOLE 20 MG PO CPDR: DELAYED_RELEASE_CAPSULE | ORAL | 3 refills | Status: AC

## 2021-04-11 MED ORDER — SYSTANE BALANCE 0.6 % OP SOLN: 1.0000 [drp] | Freq: Every morning | OPHTHALMIC | 3 refills | Status: AC

## 2021-04-11 MED ORDER — PAROXETINE HCL 40 MG PO TABS: 40.0000 mg | ORAL_TABLET | Freq: Every morning | ORAL | 3 refills | Status: AC

## 2021-04-11 MED ORDER — LISINOPRIL 2.5 MG PO TABS: 2.5000 mg | ORAL_TABLET | Freq: Every morning | ORAL | 2 refills | Status: AC

## 2021-04-11 MED ORDER — POLYETHYLENE GLYCOL 3350 17 G PO PACK: 17.0000 g | PACK | Freq: Every day | ORAL | 0 refills | Status: AC | PRN

## 2021-04-11 MED ORDER — SENNOSIDES-DOCUSATE SODIUM 8.6-50 MG PO TABS: 1.0000 | ORAL_TABLET | Freq: Every day | ORAL | Status: AC

## 2021-04-11 MED ORDER — METOPROLOL TARTRATE 25 MG PO TABS: 12.5000 mg | ORAL_TABLET | Freq: Two times a day (BID) | ORAL | 2 refills | Status: AC

## 2021-04-11 MED ORDER — CYANOCOBALAMIN 100 MCG PO TABS: 100.0000 ug | ORAL_TABLET | Freq: Every day | ORAL | Status: AC

## 2021-04-11 MED ORDER — MELATONIN 5 MG PO TABS: 5.0000 mg | ORAL_TABLET | Freq: Every day | ORAL | 3 refills | Status: AC

## 2021-04-11 MED ORDER — RIVAROXABAN 15 MG PO TABS: 15.0000 mg | ORAL_TABLET | Freq: Every day | ORAL | 3 refills | Status: AC

## 2021-04-11 MED ORDER — LIDOCAINE 5 % EX PTCH: 1.0000 | MEDICATED_PATCH | CUTANEOUS | 1 refills | Status: AC

## 2021-04-11 MED ORDER — VITAMIN B-1 100 MG PO TABS: 100.0000 mg | ORAL_TABLET | Freq: Every day | ORAL | Status: AC

## 2021-04-11 NOTE — Progress Notes (Signed)
Mobility Specialist: Progress Note   04/11/21 1400  Mobility  Activity Ambulated in room  Level of Assistance Minimal assist, patient does 75% or more  Assistive Device Front wheel walker  Distance Ambulated (ft) 100 ft (50'x2)  Mobility Response Tolerated well  Mobility performed by Mobility specialist  Bed Position Chair  $Mobility charge 1 Mobility   Pre-Mobility: 88 HR, 96% SpO2 Post-Mobility: 101 HR, 154/92 BP, 96% SpO2  Pt minA to stand with mild posterior lean upon standing. Pt unsteady when turning around and needed minA to correct. Pt took one seated break lasting 1 minute between walks. Pt in chair with chair alarm on.   [facility] Daxten Kovalenko Mobility Specialist Mobility Specialist Phone: [phone]

## 2021-04-11 NOTE — Progress Notes (Signed)
Progress Note    Christina Ortiz   RCB:[id number]  DOB: 1929-04-01  DOA: 04/03/2021     8  PCP: No primary care provider on file.  CC: SOB  Hospital Course: Patient is a 87 year old female with history of paroxysmal A. fib on Eliquis, CKD, hypertension, hyperlipidemia, hypothyroidism, GERD, mild dementia presented from local ALF with altered mental status, shortness of breath and severe hypertension.  Patient's daughter had reported altered mental status with an episode of vomiting followed by hypoxia later severe hypertension. In ED, patient was hypertensive with BP 203/137, O2 sats appropriate for 4 L [place].  Patient was placed on nitro drip for hypertensive urgency.  However due to worsening mental status, hypoxic and hypotension, nitro drip was discontinued, placed BiPAP and patient was admitted to ICU for further work-up.  Interval History:  More awake and alert today. Feeling better some. Plan is now for [facility] since she's doing better since yesterday.   ROS: Constitutional: negative for chills and fevers, Respiratory: negative for cough, Cardiovascular: negative for chest pain and Gastrointestinal: negative for abdominal pain  Assessment & Plan: Acute respiratory failure with hypoxia - Multifactorial, precipitated by flash pulmonary edema, aspiration event, with underlying history of diastolic CHF, hypertensive urgency -Off the BiPAP, chest x-ray showed persistent cardiomegaly with left base atelectasis.  - O2 sats 96- 99% on 2 L O2 via [place], wean as tolerated.  Now on room air -2D echo 4/10 showed EF 60-65%, diastolic function indeterminate.  Mild to moderate mitral valve regurgitation -DC Lasix. -Aspiration precautions  Hypokalemia, hypomagnesemia -Aggressively replaced IV  Hypoglycemia -Has been on D10 infusion.  Questionable reliability with CBG checks however glucose has been low on BMP at times.  Diet has now been resumed (dysphagia) -Discontinue D10 infusion,  continue CBG checks for now.  If remains stable, can further back off -Glucose levels remaining stable on 04/10/2021 -Continue dysphagia diet  Acute metabolic encephalopathy -Multifactorial in the setting of hypertensive urgency, delirium, infection -Currently alert and oriented, improving -Delirium and aspiration precautions  Dysphagia likely due to aspiration event -Family discussion held with palliative care.  Plan is for resuming dysphagia diet and continuing aspiration precautions; family understands the risk -Continue aspiration precautions -Plan was discharge back to [facility] with hospice in place however as of 4/15 she had declined some; better on 4/16. Okay for [facility] but waiting on hospital bed delivery (expected Monday); will watch her clinically until then daily   Hypothyroidism -TSH 4.7, free T4 1.1 -Patient on Synthroid 75 MCG daily outpatient - resume oral synthroid  History of paroxysmal atrial fibrillation -Rate controlled - d/c heparin drip now that taking PO again - resume xarelto    Old records reviewed in assessment of this patient   DVT prophylaxis: SCDs Start: 04/03/21 1723 Rivaroxaban (XARELTO) tablet 15 mg Heparin drip: 04/08/21 > 4/14. Resume xarelto 4/14  Code Status:   Code Status: DNR Family Communication:   Disposition Plan: Status is: Inpatient  Remains inpatient appropriate because:Inpatient level of care appropriate due to severity of illness   Dispo: The patient is from: Home              Anticipated d/c is to: [facility] with hospice vs SNF (currently too weak for ALF)              Patient currently is medically stable to d/c. pending a safe dispo   Difficult to place patient No  Risk of unplanned readmission score: Unplanned Admission- Pilot do not use:  19.51   Objective: Blood pressure (!) 156/81, pulse 93, temperature 97.7 F (36.5 C), temperature source Oral, resp. rate (!) 22, height 5\' 2"  (1.575 m), weight 56.5 kg,  SpO2 96 %.  Examination: General appearance: more awake/alert today compared to yesterday Head: Normocephalic, without obvious abnormality, atraumatic Eyes: EOMI Lungs: clear to auscultation bilaterally Heart: irregularly irregular rhythm and S1, S2 normal Abdomen: normal findings: bowel sounds normal and soft, non-tender Extremities: edema noted in UE's Skin: mobility and turgor normal Neurologic: Grossly normal  Consultants:   PCM  Procedures:     Data Reviewed: I have personally reviewed following labs and imaging studies Results for orders placed or performed during the hospital encounter of 04/03/21 (from the past 24 hour(s))  Glucose, capillary     Status: Abnormal   Collection Time: 04/10/21  8:33 PM  Result Value Ref Range   Glucose-Capillary 121 (H) 70 - 99 mg/dL  Glucose, capillary     Status: Abnormal   Collection Time: 04/10/21 11:24 PM  Result Value Ref Range   Glucose-Capillary 110 (H) 70 - 99 mg/dL  Glucose, capillary     Status: Abnormal   Collection Time: 04/11/21  4:08 AM  Result Value Ref Range   Glucose-Capillary 108 (H) 70 - 99 mg/dL  Resp Panel by RT-PCR (Flu A&B, Covid) Nasopharyngeal Swab     Status: None   Collection Time: 04/11/21 10:00 AM   Specimen: Nasopharyngeal Swab; Nasopharyngeal(NP) swabs in vial transport medium  Result Value Ref Range   SARS Coronavirus 2 by RT PCR NEGATIVE NEGATIVE   Influenza A by PCR NEGATIVE NEGATIVE   Influenza B by PCR NEGATIVE NEGATIVE  Glucose, capillary     Status: Abnormal   Collection Time: 04/11/21  4:10 PM  Result Value Ref Range   Glucose-Capillary 62 (L) 70 - 99 mg/dL    Recent Results (from the past 240 hour(s))  Resp Panel by RT-PCR (Flu A&B, Covid) Nasopharyngeal Swab     Status: None   Collection Time: 04/03/21  1:54 PM   Specimen: Nasopharyngeal Swab; Nasopharyngeal(NP) swabs in vial transport medium  Result Value Ref Range Status   SARS Coronavirus 2 by RT PCR NEGATIVE NEGATIVE Final     Comment: (NOTE) SARS-CoV-2 target nucleic acids are NOT DETECTED.  The SARS-CoV-2 RNA is generally detectable in upper respiratory specimens during the acute phase of infection. The lowest concentration of SARS-CoV-2 viral copies this assay can detect is 138 copies/mL. A negative result does not preclude SARS-Cov-2 infection and should not be used as the sole basis for treatment or other patient management decisions. A negative result may occur with  improper specimen collection/handling, submission of specimen other than nasopharyngeal swab, presence of viral mutation(s) within the areas targeted by this assay, and inadequate number of viral copies(<138 copies/mL). A negative result must be combined with clinical observations, patient history, and epidemiological information. The expected result is Negative.  Fact Sheet for Patients:  BloggerCourse.com  Fact Sheet for Healthcare Providers:  SeriousBroker.it  This test is no t yet approved or cleared by the [place] FDA and  has been authorized for detection and/or diagnosis of SARS-CoV-2 by FDA under an Emergency Use Authorization (EUA). This EUA will remain  in effect (meaning this test can be used) for the duration of the COVID-19 declaration under Section 564(b)(1) of the Act, 21 U.S.C.section 360bbb-3(b)(1), unless the authorization is terminated  or revoked sooner.       Influenza A by PCR NEGATIVE NEGATIVE Final  Influenza B by PCR NEGATIVE NEGATIVE Final    Comment: (NOTE) The Xpert Xpress SARS-CoV-2/FLU/RSV plus assay is intended as an aid in the diagnosis of influenza from Nasopharyngeal swab specimens and should not be used as a sole basis for treatment. Nasal washings and aspirates are unacceptable for Xpert Xpress SARS-CoV-2/FLU/RSV testing.  Fact Sheet for Patients: BloggerCourse.com  Fact Sheet for Healthcare  Providers: SeriousBroker.it  This test is not yet approved or cleared by the [place] FDA and has been authorized for detection and/or diagnosis of SARS-CoV-2 by FDA under an Emergency Use Authorization (EUA). This EUA will remain in effect (meaning this test can be used) for the duration of the COVID-19 declaration under Section 564(b)(1) of the Act, 21 U.S.C. section 360bbb-3(b)(1), unless the authorization is terminated or revoked.  Performed at [facility], [address], [place], [place] [address]   MRSA PCR Screening     Status: Abnormal   Collection Time: 04/03/21  8:12 PM   Specimen: Nasal Mucosa; Nasopharyngeal  Result Value Ref Range Status   MRSA by PCR POSITIVE (A) NEGATIVE Final    Comment:        The GeneXpert MRSA Assay (FDA approved for NASAL specimens only), is one component of a comprehensive MRSA colonization surveillance program. It is not intended to diagnose MRSA infection nor to guide or monitor treatment for MRSA infections. RESULT CALLED TO, READ BACK BY AND VERIFIED WITHChalmers Guest RN [phone] 04/03/21 A BROWNING Performed at [facility], [address], [place], [place] [address]   Culture, blood (routine x 2)     Status: None   Collection Time: 04/03/21  8:14 PM   Specimen: BLOOD RIGHT ARM  Result Value Ref Range Status   Specimen Description BLOOD RIGHT ARM  Final   Special Requests   Final    BOTTLES DRAWN AEROBIC ONLY Blood Culture results may not be optimal due to an inadequate volume of blood received in culture bottles   Culture   Final    NO GROWTH 5 DAYS Performed at [facility], [address], [place], [place] [address]    Report Status 04/08/2021 FINAL  Final  Culture, blood (routine x 2)     Status: None   Collection Time: 04/03/21  8:25 PM   Specimen: BLOOD LEFT ARM  Result Value Ref Range Status   Specimen Description BLOOD LEFT ARM  Final   Special Requests   Final    BOTTLES DRAWN  AEROBIC AND ANAEROBIC Blood Culture results may not be optimal due to an inadequate volume of blood received in culture bottles   Culture   Final    NO GROWTH 5 DAYS Performed at [facility], [address] [place], [place] [address]    Report Status 04/08/2021 FINAL  Final  Urine Culture     Status: None   Collection Time: 04/03/21  8:50 PM   Specimen: Urine, Random  Result Value Ref Range Status   Specimen Description URINE, RANDOM  Final   Special Requests NONE  Final   Culture   Final    NO GROWTH Performed at [facility], [address] [place], [place] [address]    Report Status 04/05/2021 FINAL  Final  Resp Panel by RT-PCR (Flu A&B, Covid) Nasopharyngeal Swab     Status: None   Collection Time: 04/11/21 10:00 AM   Specimen: Nasopharyngeal Swab; Nasopharyngeal(NP) swabs in vial transport medium  Result Value Ref  Range Status   SARS Coronavirus 2 by RT PCR NEGATIVE NEGATIVE Final    Comment: (NOTE) SARS-CoV-2 target nucleic acids are NOT DETECTED.  The SARS-CoV-2 RNA is generally detectable in upper respiratory specimens during the acute phase of infection. The lowest concentration of SARS-CoV-2 viral copies this assay can detect is 138 copies/mL. A negative result does not preclude SARS-Cov-2 infection and should not be used as the sole basis for treatment or other patient management decisions. A negative result may occur with  improper specimen collection/handling, submission of specimen other than nasopharyngeal swab, presence of viral mutation(s) within the areas targeted by this assay, and inadequate number of viral copies(<138 copies/mL). A negative result must be combined with clinical observations, patient history, and epidemiological information. The expected result is Negative.  Fact Sheet for Patients:  BloggerCourse.com  Fact Sheet for Healthcare Providers:  SeriousBroker.it  This test is no  t yet approved or cleared by the [place] FDA and  has been authorized for detection and/or diagnosis of SARS-CoV-2 by FDA under an Emergency Use Authorization (EUA). This EUA will remain  in effect (meaning this test can be used) for the duration of the COVID-19 declaration under Section 564(b)(1) of the Act, 21 U.S.C.section 360bbb-3(b)(1), unless the authorization is terminated  or revoked sooner.       Influenza A by PCR NEGATIVE NEGATIVE Final   Influenza B by PCR NEGATIVE NEGATIVE Final    Comment: (NOTE) The Xpert Xpress SARS-CoV-2/FLU/RSV plus assay is intended as an aid in the diagnosis of influenza from Nasopharyngeal swab specimens and should not be used as a sole basis for treatment. Nasal washings and aspirates are unacceptable for Xpert Xpress SARS-CoV-2/FLU/RSV testing.  Fact Sheet for Patients: BloggerCourse.com  Fact Sheet for Healthcare Providers: SeriousBroker.it  This test is not yet approved or cleared by the [place] FDA and has been authorized for detection and/or diagnosis of SARS-CoV-2 by FDA under an Emergency Use Authorization (EUA). This EUA will remain in effect (meaning this test can be used) for the duration of the COVID-19 declaration under Section 564(b)(1) of the Act, 21 U.S.C. section 360bbb-3(b)(1), unless the authorization is terminated or revoked.  Performed at [facility], [address], [place], [place] [address]      Radiology Studies: No results found. DG Swallowing Func-Speech Pathology  Final Result    DG CHEST PORT 1 VIEW  Final Result    CT Head Wo Contrast  Final Result    DG Chest Port 1 View  Final Result      Scheduled Meds: . chlorhexidine  15 mL Mouth Rinse BID  . Chlorhexidine Gluconate Cloth  6 each Topical Daily  . docusate  100 mg Per Tube BID  . levothyroxine  75 mcg Oral Q0600  . mouth rinse  15 mL Mouth Rinse q12n4p  . polyethylene  glycol  17 g Per Tube Daily  . Rivaroxaban  15 mg Oral Q supper   PRN Meds: docusate sodium, haloperidol lactate, hydrALAZINE, ondansetron (ZOFRAN) IV, polyethylene glycol, Resource ThickenUp Clear Continuous Infusions:    LOS: 8 days  Time spent: Greater than 50% of the 35 minute visit was spent in counseling/coordination of care for the patient as laid out in the A&P.   Lewie Chamber, MD [facility] 04/11/2021, 4:33 PM

## 2021-04-11 NOTE — TOC Progression Note (Addendum)
Transition of Care [facility]) - Progression Note    Patient Details  Name: Christina Ortiz MRN: [id number] Date of Birth: Jul 14, 1929  [facility] CM/SW Contact  Patrice Paradise, LCSW Phone Number:  [phone] 04/11/2021, 2:32 PM  Clinical Narrative:     CSW was alerted that pt's hospital bed was not delivered due to her old bed being in the room. CSW spoke with pt's daughter Christina Ortiz to confirm when th bed would be removed and was informed they were on there way to take down the bed.   CSW followed up with [facility] to see when the hospital bed could be redelivered.today. CSW has asked for ETA on delivery.  CSW refaxed fl2 with updated information as requested by facility.  TOC team will continue to assist with discharge planning needs.  Expected Discharge Plan: Assisted Living Barriers to Discharge: Continued Medical Work up  Expected Discharge Plan and Services Expected Discharge Plan: Assisted Living In-house Referral: Clinical Social Work       Expected Discharge Date: 04/11/21                                     Social Determinants of Health (SDOH) Interventions    Readmission Risk Interventions No flowsheet data found.

## 2021-04-11 NOTE — TOC Progression Note (Signed)
Transition of Care [facility]) - Progression Note    Patient Details  Name: Christina Ortiz MRN: [id number] Date of Birth: 22-Dec-1929  Transition of Care [facility]) CM/SW Contact  Patrice Paradise, LCSW Phone Number: [phone] 04/11/2021, 10:38 AM  Clinical Narrative:     CSW was alerted that patient was medically ready for discharge today to [facility] [place] with hospice. CSW spoke with pt's daughter [name] to confirm the plan and that pt would be transported by [facility].  CSW slpoke with RN Deanna Artis at the facility and confirmed the needed documents for discharge. Deanna Artis informed CSW that a signed Fl2 and the DC summary. Deanna Artis also informed CSW that all of pt's meds would need scripts or in the DC summary the refills and quantity must be noted. CSW will alert MD. Deanna Artis also informed CSW that pt's hospital bed had not arrived.  CSW followed up with Melissa at [facility] and she confirmed that hospital bed was ordered however it has not been delivered. Melissa is follow up to see when the delivery will occur.  CSW was informed that hospital bed should be delivered today.   Expected Discharge Plan: Assisted Living Barriers to Discharge: Continued Medical Work up  Expected Discharge Plan and Services Expected Discharge Plan: Assisted Living In-house Referral: Clinical Social Work       Expected Discharge Date: 04/11/21                                     Social Determinants of Health (SDOH) Interventions    Readmission Risk Interventions No flowsheet data found.

## 2021-04-11 NOTE — [place] FL2 (Signed)
[place] MEDICAID FL2 LEVEL OF CARE SCREENING TOOL     IDENTIFICATION  Patient Name: Christina Ortiz Birthdate: 03-Mar-1929 Sex: female Admission Date (Current Location): 04/03/2021  [facility] and [place] Number:  Producer, television/film/video and Address:  [facility], [address], [place], [place] [address]      Provider Number: [id number]  Attending Physician Name and Address:  Lewie Chamber, MD  Relative Name and Phone Number:       Current Level of Care: Hospital Recommended Level of Care: Assisted Living Facility Prior Approval Number:    Date Approved/Denied:   PASRR Number:    Discharge Plan: Other (Comment) (ALF)    Current Diagnoses: Patient Active Problem List   Diagnosis Date Noted  . Hypomagnesemia 04/08/2021  . Pulmonary edema cardiac cause (HCC) 04/03/2021  . Hip fx, right, closed, initial encounter (HCC) 01/24/2021  . Right hip pain 01/24/2021  . Leukocytosis 01/24/2021  . Depression 01/24/2021  . Surgical counseling visit 12/10/2019  . History of esophageal stricture 11/02/2019  . Muscular deconditioning 11/01/2019  . Abnormal liver function tests   . Common bile duct calculus 10/16/2019  . Abnormal urinalysis 09/28/2019  . Adult failure to thrive syndrome 09/28/2019  . Dysphagia 09/06/2018  . Pain   . Closed fracture of femur, greater trochanter (HCC) 06/04/2018  . Hypoxia 06/03/2018  . Pain in right arm 04/10/2018  . Fall 03/31/2018  . Fracture of shaft of humerus 03/31/2018  . Disorder of skeletal muscle 03/31/2018  . Hypothermia 03/31/2018  . Healthcare maintenance 01/17/2018  . Nocturia 01/17/2018  . Macrocytosis 11/22/2017  . Acute bronchitis due to respiratory syncytial virus 11/09/2017  . Acute respiratory failure (HCC) 11/09/2017  . Hypertension 11/09/2017  . Paroxysmal atrial fibrillation (HCC) 11/09/2017  . Coronary artery disease 11/09/2017  . CKD (chronic kidney disease) stage 2, GFR 60-89 ml/min 11/09/2017   . Hypothyroidism 11/09/2017  . Dyspnea 11/08/2017  . Upper respiratory infection 11/08/2017  . Serum creatinine raised 06/21/2017  . Ankle edema 06/21/2017  . Diarrhea 05/25/2017  . Hypokalemia 05/25/2017  . Alcohol abuse 05/25/2017  . Recurrent depression (HCC) 03/30/2017  . Low back pain 03/30/2017  . Influenza 01/22/2017  . Coronary atherosclerosis 04/04/2015  . Hypertensive disorder 04/04/2015  . Hyperlipidemia 04/04/2015  . Ex-smoker 04/04/2015  . Chronic anticoagulation 04/04/2015    Orientation RESPIRATION BLADDER Height & Weight        Normal Incontinent Weight: 124 lb 9 oz (56.5 kg) Height:  5\' 2"  (157.5 cm)  BEHAVIORAL SYMPTOMS/MOOD NEUROLOGICAL BOWEL NUTRITION STATUS      Incontinent Diet (Dysphagia 1 (puree);Thin liquid)  AMBULATORY STATUS COMMUNICATION OF NEEDS Skin     Verbally                         Personal Care Assistance Level of Assistance  Bathing,Feeding,Dressing Bathing Assistance: Limited assistance Feeding assistance: Limited assistance Dressing Assistance: Limited assistance     Functional Limitations Info  Sight,Hearing,Speech Sight Info: Impaired Hearing Info: Adequate Speech Info: Adequate    SPECIAL CARE FACTORS FREQUENCY  Speech therapy,PT (By licensed PT),OT (By licensed OT)     PT Frequency: Hospice OT Frequency: Hospice     Speech Therapy Frequency: 2x per week      Contractures Contractures Info: Not present    Additional Factors Info  Code Status,Allergies,Psychotropic Code Status Info: DNR Allergies Info: NKA Psychotropic Info: Wellbutrin, Paxil         Current Medications (04/11/2021):  This is the current hospital active medication list Current Facility-Administered Medications  Medication Dose Route Frequency Provider Last Rate Last Admin  . chlorhexidine (PERIDEX) 0.12 % solution 15 mL  15 mL Mouth Rinse BID Luciano Cutter, MD   15 mL at 04/10/21 2205  . Chlorhexidine Gluconate Cloth 2 % PADS 6 each   6 each Topical Daily Luciano Cutter, MD   6 each at 04/10/21 1055  . docusate (COLACE) 50 MG/5ML liquid 100 mg  100 mg Per Tube BID Luciano Cutter, MD      . docusate sodium (COLACE) capsule 100 mg  100 mg Oral BID PRN Raymon Mutton F, NP      . haloperidol lactate (HALDOL) injection 5 mg  5 mg Intravenous Q6H PRN Luciano Cutter, MD   5 mg at 04/09/21 0033  . hydrALAZINE (APRESOLINE) injection 10 mg  10 mg Intravenous Q6H PRN Raymon Mutton F, NP   10 mg at 04/10/21 0430  . levothyroxine (SYNTHROID) tablet 75 mcg  75 mcg Oral Q0600 Lewie Chamber, MD   75 mcg at 04/11/21 0526  . MEDLINE mouth rinse  15 mL Mouth Rinse q12n4p Luciano Cutter, MD   15 mL at 04/10/21 1836  . ondansetron (ZOFRAN) injection 4 mg  4 mg Intravenous Q6H PRN Raymon Mutton F, NP   4 mg at 04/10/21 1443  . polyethylene glycol (MIRALAX / GLYCOLAX) packet 17 g  17 g Oral Daily PRN Raymon Mutton F, NP      . polyethylene glycol (MIRALAX / GLYCOLAX) packet 17 g  17 g Per Tube Daily Luciano Cutter, MD      . Resource ThickenUp Clear   Oral PRN Luciano Cutter, MD      . Rivaroxaban [name]) tablet 15 mg  15 mg Oral Q supper Lewie Chamber, MD   15 mg at 04/10/21 1832     Discharge Medications: Please see discharge summary for a list of discharge medications.  Relevant Imaging Results:  Relevant Lab Results:   Additional Information SSN [id number]  Pfizer COVID-19 Vaccine 02/09/2020 , 01/19/2020 pallitive to follow at [facility], LCSW

## 2021-04-11 NOTE — Discharge Summary (Addendum)
Physician Discharge Summary   Christina Ortiz WGN:[id number] DOB: August 27, 1929 DOA: 04/03/2021  PCP: No primary care provider on file.  Admit date: 04/03/2021 Discharge date:  04/13/2021  Admitted From: ALF Disposition:  [facility] Discharging physician: Lewie Chamber, MD  Recommendations for Outpatient Follow-up:  1. Patient discharging with hospice in place. If further decline consider further de-escalation of meds and pursuing comfort care    Patient discharged to [facility] in Discharge Condition: fair Risk of unplanned readmission score: Unplanned Admission- Pilot do not use: 20.02  CODE STATUS: DNR Diet recommendation:  Diet Orders (From admission, onward)    Start     Ordered   04/13/21 1327  DIET DYS 2 Room service appropriate? No; Fluid consistency: Thin  Diet effective now       Question Answer Comment  Room service appropriate? No   Fluid consistency: Thin      04/13/21 1326          Hospital Course: Patient is a 87 year old female with history of paroxysmal A. fib on Eliquis, CKD, hypertension, hyperlipidemia, hypothyroidism, GERD, mild dementia presented from local ALF with altered mental status, shortness of breath and severe hypertension. Patient's daughter had reported altered mental status with an episode of vomiting followed by hypoxia later severe hypertension. In ED, patient was hypertensive with BP 203/137, O2 sats appropriate for 4 L [place]. Patient was placed on nitro drip for hypertensive urgency. However due to worsening mental status, hypoxic and hypotension, nitro drip was discontinued, placed BiPAP and patient was admitted to ICU for further work-up. She overall improved clinically but had transient times of decreased mentation/lethargy and poor appetite. With further GOC discussions with daughter and palliative care, plan was to involve hospice at discharge and for patient to return to [facility]. She may need long term SNF at some point if she declines  further vs home with family for hospice/end of life care.  In regards to her dysphagia, she underwent swallow eval and again after family discussion we decided to allow her to have a dysphagia diet and family understands aspiration risk still. If she does aspirate and develop respiratory distress and/or PNA, would also involve hospice to determine plan at that point also.   Acute respiratory failure with hypoxia -Multifactorial, precipitated by flash pulmonary edema, aspiration event, with underlying history of diastolic CHF, hypertensive urgency -Off the BiPAP, chest x-ray showed persistent cardiomegaly with left base atelectasis. -O2 sats 96-99% on 2 L O2 via [place], wean as tolerated.  Now on room air -2D echo 4/10 showed EF 60-65%, diastolic function indeterminate. Mild to moderate mitral valve regurgitation -Aspiration precautions  Hypokalemia, hypomagnesemia -Aggressively replaced IV  Hypoglycemia -Has been on D10 infusion.  Questionable reliability with CBG checks however glucose has been low on BMP at times.  Diet has now been resumed (dysphagia) -Discontinue D10 infusion, continue CBG checks for now.  Glucose stable after D10 stopped and she is eating fairly well -Glucose levels remaining stable on 04/10/2021 -Continue dysphagia diet  Acute metabolic encephalopathy - resolved  -Multifactorial in the setting of hypertensive urgency, delirium, infection -Currently alert and oriented, improving -Delirium and aspiration precautions  Dysphagia likely due to aspiration event -Family discussion held with palliative care.  Plan is for resuming dysphagia diet and continuing aspiration precautions; family understands the risk -Continue aspiration precautions - on 4/16, she appears a little better than she did on 4/15 so will try for discharge to [facility]  Hypothyroidism -TSH 4.7, free T4 1.1 -Patient on Synthroid  75 MCG daily outpatient - resume oral synthroid  History of  paroxysmal atrial fibrillation -Rate controlled - d/c heparin drip now that taking PO again - resume xarelto    Principal Diagnosis: Acute metabolic encephalopathy  Discharge Diagnoses: Active Hospital Problems   Diagnosis Date Noted  . Hypomagnesemia 04/08/2021  . Pulmonary edema cardiac cause (HCC) 04/03/2021  . Dysphagia 09/06/2018  . Hypertension 11/09/2017  . Hypothyroidism 11/09/2017  . Paroxysmal atrial fibrillation (HCC) 11/09/2017  . Hypokalemia 05/25/2017    Resolved Hospital Problems   Diagnosis Date Noted Date Resolved  . Acute metabolic encephalopathy 04/08/2021 04/11/2021  . Acute respiratory failure with hypoxia [facility]) 04/08/2021 04/11/2021    Discharge Instructions    Increase activity slowly   Complete by: As directed    Increase activity slowly   Complete by: As directed      Allergies as of 04/13/2021   No Known Allergies     Medication List    STOP taking these medications   B COMPLEX 100 PO   buPROPion 150 MG 24 hr tablet Commonly known as: Wellbutrin XL   CALCIUM-MAGNESIUM-ZINC PO   ferrous sulfate 325 (65 FE) MG tablet   HYDROcodone-acetaminophen 5-325 MG tablet Commonly known as: Norco   potassium chloride SA 20 MEQ tablet Commonly known as: KLOR-CON   simvastatin 5 MG tablet Commonly known as: ZOCOR   spironolactone 25 MG tablet Commonly known as: ALDACTONE     TAKE these medications   acetaminophen 325 MG tablet Commonly known as: TYLENOL Take 2 tablets (650 mg total) by mouth every 6 (six) hours as needed for fever (or pain).   cyanocobalamin 100 MCG tablet Take 1 tablet (100 mcg total) by mouth daily.   Ensure Take 237 mLs by mouth in the morning and at bedtime.   folic acid 1 MG tablet Commonly known as: FOLVITE Take 1 tablet (1 mg total) by mouth daily.   levothyroxine 75 MCG tablet Commonly known as: SYNTHROID Take 1 tablet (75 mcg total) by mouth daily before breakfast. **PATIENT NEEDS LAB FOR FURTHER  REFILLS** What changed: additional instructions   lidocaine 5 % Commonly known as: LIDODERM Place 1 patch onto the skin daily. Remove & Discard patch within 12 hours or as directed by MD   lisinopril 2.5 MG tablet Commonly known as: ZESTRIL Take 1 tablet (2.5 mg total) by mouth in the morning.   Listerine Antiseptic Liqd 15 mLs by Mouth Rinse route in the morning.   melatonin 5 MG Tabs Take 1 tablet (5 mg total) by mouth at bedtime.   metoprolol tartrate 25 MG tablet Commonly known as: LOPRESSOR Take 0.5 tablets (12.5 mg total) by mouth 2 (two) times daily. **NEEDS APT FOR FURTHER MED REFILLS** What changed:   when to take this  additional instructions   omeprazole 20 MG capsule Commonly known as: PRILOSEC TAKE 1 CAPSULE BY MOUTH DAILY AS NEEDED FOR HEARTBURN. What changed: See the new instructions.   PARoxetine 40 MG tablet Commonly known as: PAXIL Take 1 tablet (40 mg total) by mouth in the morning. What changed: Another medication with the same name was removed. Continue taking this medication, and follow the directions you see here.   polyethylene glycol 17 g packet Commonly known as: MIRALAX / GLYCOLAX Take 17 g by mouth daily as needed for moderate constipation. What changed:   when to take this  reasons to take this   Rivaroxaban 15 MG Tabs tablet Commonly known as: Xarelto Take 1 tablet (15 mg total)  by mouth daily with supper.   senna-docusate 8.6-50 MG tablet Commonly known as: Senokot-S Take 1 tablet by mouth at bedtime.   Systane Balance 0.6 % Soln Generic drug: Propylene Glycol Place 1 drop into both eyes in the morning.   thiamine 100 MG tablet Commonly known as: Vitamin B-1 Take 1 tablet (100 mg total) by mouth daily.   Vitamin D3 25 MCG (1000 UT) Caps Take 1 capsule (1,000 Units total) by mouth in the morning.       No Known Allergies   Discharge Exam: BP (!) 161/85 (BP Location: Left Arm)   Pulse 82   Temp 97.6 F (36.4 C)  (Oral)   Resp 20   Ht 5\' 2"  (1.575 m)   Wt 58.6 kg   LMP  (LMP Unknown)   SpO2 96%   BMI 23.63 kg/m  General appearance: more awake/alert today, energy has improved Head: Normocephalic, without obvious abnormality, atraumatic Eyes: EOMI Lungs: clear to auscultation bilaterally Heart: irregularly irregular rhythm and S1, S2 normal Abdomen: normal findings: bowel sounds normal and soft, non-tender Extremities: edema noted in UE's Skin: frail skin Neurologic: Grossly normal  The results of significant diagnostics from this hospitalization (including imaging, microbiology, ancillary and laboratory) are listed below for reference.   Microbiology: Recent Results (from the past 240 hour(s))  MRSA PCR Screening     Status: Abnormal   Collection Time: 04/03/21  8:12 PM   Specimen: Nasal Mucosa; Nasopharyngeal  Result Value Ref Range Status   MRSA by PCR POSITIVE (A) NEGATIVE Final    Comment:        The GeneXpert MRSA Assay (FDA approved for NASAL specimens only), is one component of a comprehensive MRSA colonization surveillance program. It is not intended to diagnose MRSA infection nor to guide or monitor treatment for MRSA infections. RESULT CALLED TO, READ BACK BY AND VERIFIED WITHChalmers Guest RN [phone] 04/03/21 A BROWNING Performed at [facility], [address] [place], [place] [address]   Culture, blood (routine x 2)     Status: None   Collection Time: 04/03/21  8:14 PM   Specimen: BLOOD RIGHT ARM  Result Value Ref Range Status   Specimen Description BLOOD RIGHT ARM  Final   Special Requests   Final    BOTTLES DRAWN AEROBIC ONLY Blood Culture results may not be optimal due to an inadequate volume of blood received in culture bottles   Culture   Final    NO GROWTH 5 DAYS Performed at [facility], [address], [place], [place] [address]    Report Status 04/08/2021 FINAL  Final  Culture, blood (routine x 2)     Status: None   Collection Time: 04/03/21  8:25  PM   Specimen: BLOOD LEFT ARM  Result Value Ref Range Status   Specimen Description BLOOD LEFT ARM  Final   Special Requests   Final    BOTTLES DRAWN AEROBIC AND ANAEROBIC Blood Culture results may not be optimal due to an inadequate volume of blood received in culture bottles   Culture   Final    NO GROWTH 5 DAYS Performed at [facility], [address] [place], [place] [address]    Report Status 04/08/2021 FINAL  Final  Urine Culture     Status: None   Collection Time: 04/03/21  8:50 PM   Specimen: Urine, Random  Result Value Ref Range Status   Specimen Description URINE, RANDOM  Final   Special  Requests NONE  Final   Culture   Final    NO GROWTH Performed at [facility], [address], [place], [place] [address]    Report Status 04/05/2021 FINAL  Final  Resp Panel by RT-PCR (Flu A&B, Covid) Nasopharyngeal Swab     Status: None   Collection Time: 04/11/21 10:00 AM   Specimen: Nasopharyngeal Swab; Nasopharyngeal(NP) swabs in vial transport medium  Result Value Ref Range Status   SARS Coronavirus 2 by RT PCR NEGATIVE NEGATIVE Final    Comment: (NOTE) SARS-CoV-2 target nucleic acids are NOT DETECTED.  The SARS-CoV-2 RNA is generally detectable in upper respiratory specimens during the acute phase of infection. The lowest concentration of SARS-CoV-2 viral copies this assay can detect is 138 copies/mL. A negative result does not preclude SARS-Cov-2 infection and should not be used as the sole basis for treatment or other patient management decisions. A negative result may occur with  improper specimen collection/handling, submission of specimen other than nasopharyngeal swab, presence of viral mutation(s) within the areas targeted by this assay, and inadequate number of viral copies(<138 copies/mL). A negative result must be combined with clinical observations, patient history, and epidemiological information. The expected result is Negative.  Fact Sheet for  Patients:  BloggerCourse.com  Fact Sheet for Healthcare Providers:  SeriousBroker.it  This test is no t yet approved or cleared by the [place] FDA and  has been authorized for detection and/or diagnosis of SARS-CoV-2 by FDA under an Emergency Use Authorization (EUA). This EUA will remain  in effect (meaning this test can be used) for the duration of the COVID-19 declaration under Section 564(b)(1) of the Act, 21 U.S.C.section 360bbb-3(b)(1), unless the authorization is terminated  or revoked sooner.       Influenza A by PCR NEGATIVE NEGATIVE Final   Influenza B by PCR NEGATIVE NEGATIVE Final    Comment: (NOTE) The Xpert Xpress SARS-CoV-2/FLU/RSV plus assay is intended as an aid in the diagnosis of influenza from Nasopharyngeal swab specimens and should not be used as a sole basis for treatment. Nasal washings and aspirates are unacceptable for Xpert Xpress SARS-CoV-2/FLU/RSV testing.  Fact Sheet for Patients: BloggerCourse.com  Fact Sheet for Healthcare Providers: SeriousBroker.it  This test is not yet approved or cleared by the [place] FDA and has been authorized for detection and/or diagnosis of SARS-CoV-2 by FDA under an Emergency Use Authorization (EUA). This EUA will remain in effect (meaning this test can be used) for the duration of the COVID-19 declaration under Section 564(b)(1) of the Act, 21 U.S.C. section 360bbb-3(b)(1), unless the authorization is terminated or revoked.  Performed at [facility], [address] [place], [place] [address]      Labs: BNP (last 3 results) Recent Labs    04/03/21 1352 04/06/21 1004  BNP 1,105.1* 593.2*   Basic Metabolic Panel: Recent Labs  Lab 04/07/21 0327 04/08/21 0405 04/08/21 1731 04/09/21 0251 04/10/21 0315  NA 136 133* 132* 133* 135  K 2.4* 2.7* 3.2* 4.3 4.0  CL 97* 94* 93* 96* 100  CO2 29  28 26 27 28   GLUCOSE 104* 82 97 84 76  BUN 18 12 10 12 19   CREATININE 0.96 0.86 0.88 0.95 0.91  CALCIUM 9.0 9.0 8.8* 9.1 9.3  MG 1.5* 2.2  --  1.7 1.8   Liver Function Tests: No results for input(s): AST, ALT, ALKPHOS, BILITOT, PROT, ALBUMIN in the last 168 hours. No results for input(s): LIPASE, AMYLASE in the last 168 hours.  No results for input(s): AMMONIA in the last 168 hours. CBC: Recent Labs  Lab 04/07/21 0327 04/08/21 0405 04/09/21 0251 04/10/21 0315  WBC 10.7* 9.9 11.6* 10.2  NEUTROABS  --   --  9.2* 7.6  HGB 14.5 15.3* 14.8 15.4*  HCT 44.2 46.1* 44.7 46.2*  MCV 98.4 98.3 97.2 97.7  PLT 235 252 238 225   Cardiac Enzymes: No results for input(s): CKTOTAL, CKMB, CKMBINDEX, TROPONINI in the last 168 hours. BNP: Invalid input(s): POCBNP CBG: Recent Labs  Lab 04/12/21 1626 04/12/21 2115 04/13/21 0633 04/13/21 0659 04/13/21 1106  GLUCAP 75 106* 63* 97 88   D-Dimer No results for input(s): DDIMER in the last 72 hours. Hgb A1c No results for input(s): HGBA1C in the last 72 hours. Lipid Profile No results for input(s): CHOL, HDL, LDLCALC, TRIG, CHOLHDL, LDLDIRECT in the last 72 hours. Thyroid function studies No results for input(s): TSH, T4TOTAL, T3FREE, THYROIDAB in the last 72 hours.  Invalid input(s): FREET3 Anemia work up No results for input(s): VITAMINB12, FOLATE, FERRITIN, TIBC, IRON, RETICCTPCT in the last 72 hours. Urinalysis    Component Value Date/Time   COLORURINE YELLOW 04/03/2021 1630   APPEARANCEUR HAZY (A) 04/03/2021 1630   LABSPEC >1.030 (H) 04/03/2021 1630   PHURINE 5.0 04/03/2021 1630   GLUCOSEU NEGATIVE 04/03/2021 1630   HGBUR SMALL (A) 04/03/2021 1630   BILIRUBINUR SMALL (A) 04/03/2021 1630   BILIRUBINUR negative 08/02/2020 1543   BILIRUBINUR negative 09/27/2019 1041   KETONESUR NEGATIVE 04/03/2021 1630   PROTEINUR 100 (A) 04/03/2021 1630   UROBILINOGEN 0.2 08/02/2020 1543   UROBILINOGEN 1.0 04/12/2017 1157   NITRITE NEGATIVE  04/03/2021 1630   LEUKOCYTESUR NEGATIVE 04/03/2021 1630   Sepsis Labs Invalid input(s): PROCALCITONIN,  WBC,  LACTICIDVEN Microbiology Recent Results (from the past 240 hour(s))  MRSA PCR Screening     Status: Abnormal   Collection Time: 04/03/21  8:12 PM   Specimen: Nasal Mucosa; Nasopharyngeal  Result Value Ref Range Status   MRSA by PCR POSITIVE (A) NEGATIVE Final    Comment:        The GeneXpert MRSA Assay (FDA approved for NASAL specimens only), is one component of a comprehensive MRSA colonization surveillance program. It is not intended to diagnose MRSA infection nor to guide or monitor treatment for MRSA infections. RESULT CALLED TO, READ BACK BY AND VERIFIED WITHChalmers Guest RN [id number] 04/03/21 A BROWNING Performed at [facility], [address], [place], [place] [address]   Culture, blood (routine x 2)     Status: None   Collection Time: 04/03/21  8:14 PM   Specimen: BLOOD RIGHT ARM  Result Value Ref Range Status   Specimen Description BLOOD RIGHT ARM  Final   Special Requests   Final    BOTTLES DRAWN AEROBIC ONLY Blood Culture results may not be optimal due to an inadequate volume of blood received in culture bottles   Culture   Final    NO GROWTH 5 DAYS Performed at [facility], [address] [place], [place] [address]    Report Status 04/08/2021 FINAL  Final  Culture, blood (routine x 2)     Status: None   Collection Time: 04/03/21  8:25 PM   Specimen: BLOOD LEFT ARM  Result Value Ref Range Status   Specimen Description BLOOD LEFT ARM  Final   Special Requests   Final    BOTTLES DRAWN AEROBIC AND ANAEROBIC Blood Culture results may not be optimal due to an inadequate  volume of blood received in culture bottles   Culture   Final    NO GROWTH 5 DAYS Performed at [facility], [address], [place], [place] [address]    Report Status 04/08/2021 FINAL  Final  Urine Culture     Status: None   Collection Time: 04/03/21  8:50 PM   Specimen:  Urine, Random  Result Value Ref Range Status   Specimen Description URINE, RANDOM  Final   Special Requests NONE  Final   Culture   Final    NO GROWTH Performed at [facility], [address] [place], [place] [address]    Report Status 04/05/2021 FINAL  Final  Resp Panel by RT-PCR (Flu A&B, Covid) Nasopharyngeal Swab     Status: None   Collection Time: 04/11/21 10:00 AM   Specimen: Nasopharyngeal Swab; Nasopharyngeal(NP) swabs in vial transport medium  Result Value Ref Range Status   SARS Coronavirus 2 by RT PCR NEGATIVE NEGATIVE Final    Comment: (NOTE) SARS-CoV-2 target nucleic acids are NOT DETECTED.  The SARS-CoV-2 RNA is generally detectable in upper respiratory specimens during the acute phase of infection. The lowest concentration of SARS-CoV-2 viral copies this assay can detect is 138 copies/mL. A negative result does not preclude SARS-Cov-2 infection and should not be used as the sole basis for treatment or other patient management decisions. A negative result may occur with  improper specimen collection/handling, submission of specimen other than nasopharyngeal swab, presence of viral mutation(s) within the areas targeted by this assay, and inadequate number of viral copies(<138 copies/mL). A negative result must be combined with clinical observations, patient history, and epidemiological information. The expected result is Negative.  Fact Sheet for Patients:  BloggerCourse.com  Fact Sheet for Healthcare Providers:  SeriousBroker.it  This test is no t yet approved or cleared by the [place] FDA and  has been authorized for detection and/or diagnosis of SARS-CoV-2 by FDA under an Emergency Use Authorization (EUA). This EUA will remain  in effect (meaning this test can be used) for the duration of the COVID-19 declaration under Section 564(b)(1) of the Act, 21 U.S.C.section 360bbb-3(b)(1), unless the  authorization is terminated  or revoked sooner.       Influenza A by PCR NEGATIVE NEGATIVE Final   Influenza B by PCR NEGATIVE NEGATIVE Final    Comment: (NOTE) The Xpert Xpress SARS-CoV-2/FLU/RSV plus assay is intended as an aid in the diagnosis of influenza from Nasopharyngeal swab specimens and should not be used as a sole basis for treatment. Nasal washings and aspirates are unacceptable for Xpert Xpress SARS-CoV-2/FLU/RSV testing.  Fact Sheet for Patients: BloggerCourse.com  Fact Sheet for Healthcare Providers: SeriousBroker.it  This test is not yet approved or cleared by the [place] FDA and has been authorized for detection and/or diagnosis of SARS-CoV-2 by FDA under an Emergency Use Authorization (EUA). This EUA will remain in effect (meaning this test can be used) for the duration of the COVID-19 declaration under Section 564(b)(1) of the Act, 21 U.S.C. section 360bbb-3(b)(1), unless the authorization is terminated or revoked.  Performed at [facility], [address], [place], [place] [address]     Procedures/Studies: CT Head Wo Contrast  Result Date: 04/03/2021 CLINICAL DATA:  Altered mental status EXAM: CT HEAD WITHOUT CONTRAST TECHNIQUE: Contiguous axial images were obtained from the base of the skull through the vertex without intravenous contrast. COMPARISON:  January 24, 2021 FINDINGS: Brain: Moderate diffuse atrophy is stable. There is no  intracranial mass, hemorrhage, extra-axial fluid collection, or midline shift. There is decreased attenuation in portions of the centra semiovale bilaterally. There is small vessel disease in the anterior limb of each external capsule. Probable small lacunar infarcts noted in each inferior centrum semiovale. There is evidence of a prior small infarct in the lateral right cerebellum somewhat posterior in location, stable. No acute infarct appreciable. Vascular: No  hyperdense vessels. There is calcification in each carotid siphon region. Skull: The bony calvarium appears intact. Sinuses/Orbits: Paranasal sinuses clear. Orbits appear symmetric bilaterally. Other: Mastoid air cells clear. IMPRESSION: Atrophy with stable supratentorial small vessel disease. 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 No acute appearing infarct is demonstrated on this study. No mass or hemorrhage. There are foci of arterial vascular calcification. Electronically Signed   By: Bretta BangWilliam  Woodruff III M.D.   On: 04/03/2021 16:36   DG CHEST PORT 1 VIEW  Result Date: 04/06/2021 CLINICAL DATA:  Hypoxia EXAM: PORTABLE CHEST 1 VIEW COMPARISON:  04/03/2021 FINDINGS: Cardiomegaly. Worsening airspace disease in the left mid and lower lung. Vascular congestion with right perihilar and infrahilar opacities. Small bilateral effusions. Aortic atherosclerosis. No acute bony abnormality. IMPRESSION: Cardiomegaly, vascular congestion. Worsening patchy airspace disease in the left mid lung and right lower lobe. Findings concerning for pneumonia. Electronically Signed   By: Charlett NoseKevin  Dover M.D.   On: 04/06/2021 08:50   DG Chest Port 1 View  Result Date: 04/03/2021 CLINICAL DATA:  Shortness of breath and decreased oxygen saturation EXAM: PORTABLE CHEST 1 VIEW COMPARISON:  July 23, 2020 FINDINGS: There is atelectatic change in the left base region. No edema or airspace opacity. There is again noted generalized cardiac enlargement with pulmonary vascularity normal. No adenopathy. There is aortic atherosclerosis. No bone lesions. IMPRESSION: Persistent cardiomegaly. Left base atelectasis. Lungs elsewhere clear. No adenopathy evident. Aortic Atherosclerosis (ICD10-I70.0). Electronically Signed   By: Bretta BangWilliam  Woodruff III M.D.   On: 04/03/2021 14:42   DG Swallowing Func-Speech Pathology  Result Date: 04/06/2021 Objective Swallowing Evaluation: Type of Study: MBS-Modified Barium Swallow Study   Patient Details Name: Sherrie SportLita Vanderheyden MRN: [id number] Date of Birth: 07/07/1929 Today's Date: 04/06/2021 Time: SLP Start Time (ACUTE ONLY): 1215 -SLP Stop Time (ACUTE ONLY): 1236 SLP Time Calculation (min) (ACUTE ONLY): 21 min Past Medical History: Past Medical History: Diagnosis Date . Acute bronchitis 10/2017 . Arthritis  . CKD (chronic kidney disease)   Xarelto dose is 15 mg QD . Coronary artery disease   a. s/p PCI/st[facility] RCA and LAD [place] [place] in [place]; b. Nuc 1/15: No ischemia, EF 70% . Depression  . Gallstones  . Gastritis  . GERD (gastroesophageal reflux disease)  . Hiatal hernia  . Hip fracture (HCC)  . History of cholecystectomy  . History of echocardiogram   a. Echo 1/15: Moderate LVH, EF 55-60%, impaired relaxation, mild AS . Hyperlipidemia  . Hypertension  . Hypokalemia  . Hypothyroidism  . Mobitz type 1 second degree atrioventricular block   a. Event Monitor 3/15: NSR, first-degree AV block, second-degree AB block - Mobitz 1, PACs, NSVT (5 beats) . PAF (paroxysmal atrial fibrillation) (HCC)  . Thyroid disease  Past Surgical History: Past Surgical History: Procedure Laterality Date . ANGIOPLASTY  2006  with sent placement . BACK SURGERY    Dr Shon BatonBrooks . BILIARY DILATION  10/18/2019  Procedure: BILIARY DILATION;  Surgeon: Rachael FeeJacobs, Daniel P, MD;  Location: [facility] ENDOSCOPY;  Service: Endoscopy;; . BILIARY DILATION  12/17/2019  Procedure: BILIARY DILATION;  Surgeon: Russella DarStark,  Venita Lick, MD;  Location: [facility]OSCOPY;  Service: Endoscopy;; . BILIARY DILATION  02/07/2020  Procedure: BILIARY DILATION;  Surgeon: Lemar Lofty., MD;  Location: [facility] ENDOSCOPY;  Service: Gastroenterology;; . BILIARY DILATION  09/10/2020  Procedure: BILIARY DILATION;  Surgeon: Lemar Lofty., MD;  Location: [facility] ENDOSCOPY;  Service: Gastroenterology;; . BILIARY STENT PLACEMENT N/A 10/18/2019  Procedure: BILIARY STENT PLACEMENT;  Surgeon: Rachael Fee, MD;  Location: [facility] ENDOSCOPY;  Service: Endoscopy;   Laterality: N/A; . BILIARY STENT PLACEMENT N/A 12/17/2019  Procedure: BILIARY STENT PLACEMENT;  Surgeon: [name], MD;  Location: [facility] ENDOSCOPY;  Service: Endoscopy;  Laterality: N/A; . BILIARY STENT PLACEMENT  02/07/2020  Procedure: BILIARY STENT PLACEMENT;  Surgeon: Meridee Score Netty Starring., MD;  Location: [facility] ENDOSCOPY;  Service: Gastroenterology;; . BLEPHAROPLASTY   . BREAST LUMPECTOMY Left  . CARDIAC CATHETERIZATION  2006 . CATARACT EXTRACTION Bilateral  . CHOLECYSTECTOMY   . DILATION AND CURETTAGE OF UTERUS   . ENDOSCOPIC RETROGRADE CHOLANGIOPANCREATOGRAPHY (ERCP) WITH PROPOFOL N/A 12/17/2019  Procedure: ENDOSCOPIC RETROGRADE CHOLANGIOPANCREATOGRAPHY (ERCP) WITH PROPOFOL;  Surgeon: [name], MD;  Location: [facility] ENDOSCOPY;  Service: Endoscopy;  Laterality: N/A; . ENDOSCOPIC RETROGRADE CHOLANGIOPANCREATOGRAPHY (ERCP) WITH PROPOFOL N/A 02/07/2020  Procedure: ENDOSCOPIC RETROGRADE CHOLANGIOPANCREATOGRAPHY (ERCP) WITH PROPOFOL;  Surgeon: Meridee Score Netty Starring., MD;  Location: [facility] ENDOSCOPY;  Service: Gastroenterology;  Laterality: N/A; . ENDOSCOPIC RETROGRADE CHOLANGIOPANCREATOGRAPHY (ERCP) WITH PROPOFOL N/A 09/10/2020  Procedure: ENDOSCOPIC RETROGRADE CHOLANGIOPANCREATOGRAPHY (ERCP) WITH PROPOFOL;  Surgeon: Meridee Score Netty Starring., MD;  Location: [facility] ENDOSCOPY;  Service: Gastroenterology;  Laterality: N/A; . ERCP N/A 10/18/2019  Procedure: ENDOSCOPIC RETROGRADE CHOLANGIOPANCREATOGRAPHY (ERCP);  Surgeon: Rachael Fee, MD;  Location: [facility] ENDOSCOPY;  Service: Endoscopy;  Laterality: N/A; . ESOPHAGOGASTRODUODENOSCOPY (EGD) WITH PROPOFOL N/A 12/17/2019  Procedure: ESOPHAGOGASTRODUODENOSCOPY (EGD) WITH PROPOFOL;  Surgeon: [name], MD;  Location: [facility] ENDOSCOPY;  Service: Endoscopy;  Laterality: N/A; . INTRAMEDULLARY (IM) NAIL INTERTROCHANTERIC Right 01/25/2021  Procedure: INTRAMEDULLARY (IM) NAIL INTERTROCHANTRIC;  Surgeon: Durene Romans, MD;  Location: [facility] OR;  Service: Orthopedics;  Laterality: Right; .  KYPHOPLASTY N/A 01/03/2020  Procedure: KYPHOPLASTY LUMBAR TWO;  Surgeon: Venita Lick, MD;  Location: [facility] OR;  Service: Orthopedics;  Laterality: N/A; . REMOVAL OF STONES  10/18/2019  Procedure: REMOVAL OF STONES;  Surgeon: Rachael Fee, MD;  Location: [facility] ENDOSCOPY;  Service: Endoscopy;; . REMOVAL OF STONES  12/17/2019  Procedure: REMOVAL OF STONES;  Surgeon: [name], MD;  Location: [facility] ENDOSCOPY;  Service: Endoscopy;; . REMOVAL OF STONES  02/07/2020  Procedure: REMOVAL OF STONES;  Surgeon: Lemar Lofty., MD;  Location: [facility] ENDOSCOPY;  Service: Gastroenterology;; . REMOVAL OF STONES  09/10/2020  Procedure: REMOVAL OF STONES;  Surgeon: Lemar Lofty., MD;  Location: [facility] ENDOSCOPY;  Service: Gastroenterology;; . Gaspar Bidding DILATION N/A 12/17/2019  Procedure: Gaspar Bidding DILATION;  Surgeon: [name], MD;  Location: [facility] ENDOSCOPY;  Service: Endoscopy;  Laterality: N/A; . SPHINCTEROTOMY  10/18/2019  Procedure: SPHINCTEROTOMY;  Surgeon: Rachael Fee, MD;  Location: [facility] ENDOSCOPY;  Service: Endoscopy;; . Dennison Mascot  12/17/2019  Procedure: Dennison Mascot;  Surgeon: [name], MD;  Location: [facility] ENDOSCOPY;  Service: Endoscopy;; . Burman Freestone CHOLANGIOSCOPY N/A 02/07/2020  Procedure: EUMPNTIR CHOLANGIOSCOPY;  Surgeon: Lemar Lofty., MD;  Location: [facility] ENDOSCOPY;  Service: Gastroenterology;  Laterality: N/A; Burman Freestone LITHOTRIPSY N/A 02/07/2020  Procedure: [id number] LITHOTRIPSY;  Surgeon: Lemar Lofty., MD;  Location: [facility] ENDOSCOPY;  Service: Gastroenterology;  Laterality: N/A; . STENT REMOVAL  12/17/2019  Procedure: STENT REMOVAL;  Surgeon: [name], MD;  Location: [facility]  ENDOSCOPY;  Service: Endoscopy;; . STENT REMOVAL  02/07/2020  Procedure: STENT REMOVAL;  Surgeon: Lemar Lofty., MD;  Location: [facility] ENDOSCOPY;  Service: Gastroenterology;; . Francine Graven REMOVAL  09/10/2020  Procedure: STENT REMOVAL;  Surgeon: Lemar Lofty., MD;  Location: [facility] ENDOSCOPY;   Service: Gastroenterology;; HPI: Pt is an 87 y.o. female with acute hypoxemic respiratory failure secondary to potential aspiration episode with vomiting. Also presents with sepsis secondary to presumed UTI. CXR 4/11: Worsening patchy airspace disease in the left mid lung and right lower lobe. Esophagram (2019): Significant distal esophageal stricture above a moderate-sized hiatal hernia; Esophageal dysmotility. PMH: GERD, HTN, hiatal hernia, gastritis  Subjective: Pt remains pleasant with some confusion, requires cueing Assessment / Plan / Recommendation CHL IP CLINICAL IMPRESSIONS 04/06/2021 Clinical Impression Pt presents with an oropharyngeal dysphagia in which her oral phase is consistently characterized by reduced/delayed posterior propulsion, lingual residue, weak lingual manipulation, lingual pumping and decreased bolus cohesion that led to reduced oral control of boluses. Anterior spillage was also noted with thin liquids via the cup but this improved with trials of nectar and honey thick liquids. When given more solid textures, it appeared that the pt barely chewed the boluses before propelling them back even with verbal cues to chew thoroughly. Pt consistently demonstrated reduced airway protection in which timing and airway closure were not appropriately coordinated during her pharyngeal phase. This combined with her decreased oral control of boluses allowed for boluses to spill into her airway during her swallow resulting in silent aspiration (PAS 8) of all liquids and penetration across consistencies. Vallecular residue was also noted due to reduced base of tongue retraction which also resulted in aspiration of boluses after her initial swallow. She was unable to clear aspirates and penetrates even when given visual cues to cough. Although aspiration was not observed with solids, consistent penetration even to the vocal folds, as well as her inability to clear penetrates, is concerning for potential  aspiration across meals. Given her cognition, SLP was unable to attempt compensatory strategies to examine if this could help improve her airway protection. Recommend that the pt be NPO with f/u for potential to participate in swallowing therapy, although baseline mentation and swallowing function is unknown to accurately assess prognosis. SLP discussed the results of this study with her NP who also agrees with this recommendation and plans to f/u further with pt/family.   SLP Visit Diagnosis Dysphagia, oropharyngeal phase (R13.12) Attention and concentration deficit following -- Frontal lobe and executive function deficit following -- Impact on safety and function Moderate aspiration risk   CHL IP TREATMENT RECOMMENDATION 04/06/2021 Treatment Recommendations Therapy as outlined in treatment plan below   Prognosis 04/06/2021 Prognosis for Safe Diet Advancement Fair Barriers to Reach Goals Cognitive deficits Barriers/Prognosis Comment -- CHL IP DIET RECOMMENDATION 04/06/2021 SLP Diet Recommendations NPO Liquid Administration via -- Medication Administration Via alternative means Compensations Slow rate;Small sips/bites;Minimize environmental distractions Postural Changes --   CHL IP OTHER RECOMMENDATIONS 04/06/2021 Recommended Consults -- Oral Care Recommendations Oral care QID Other Recommendations Order thickener from pharmacy;Prohibited food (jello, ice cream, thin soups);Remove water pitcher   CHL IP FOLLOW UP RECOMMENDATIONS 04/06/2021 Follow up Recommendations (No Data)   CHL IP FREQUENCY AND DURATION 04/06/2021 Speech Therapy Frequency (ACUTE ONLY) min 2x/week Treatment Duration 2 weeks      CHL IP ORAL PHASE 04/06/2021 Oral Phase Impaired Oral - Pudding Teaspoon -- Oral - Pudding Cup -- Oral - Honey Teaspoon Reduced posterior propulsion;Weak lingual manipulation;Lingual pumping Oral - Honey Cup Reduced  posterior propulsion;Weak lingual manipulation;Lingual pumping Oral - Nectar Teaspoon -- Oral - Nectar Cup  Lingual/palatal residue;Reduced posterior propulsion;Decreased bolus cohesion;Weak lingual manipulation;Lingual pumping Oral - Nectar Straw Lingual/palatal residue;Reduced posterior propulsion;Decreased bolus cohesion;Weak lingual manipulation;Lingual pumping Oral - Thin Teaspoon -- Oral - Thin Cup Lingual/palatal residue;Decreased bolus cohesion;Left anterior bolus loss;Right anterior bolus loss;Premature spillage Oral - Thin Straw Lingual/palatal residue;Decreased bolus cohesion;Premature spillage Oral - Puree Reduced posterior propulsion;Weak lingual manipulation;Delayed oral transit Oral - Mech Soft Decreased bolus cohesion;Impaired mastication;Reduced posterior propulsion;Weak lingual manipulation;Delayed oral transit Oral - Regular -- Oral - Multi-Consistency -- Oral - Pill -- Oral Phase - Comment --  CHL IP PHARYNGEAL PHASE 04/06/2021 Pharyngeal Phase Impaired Pharyngeal- Pudding Teaspoon -- Pharyngeal -- Pharyngeal- Pudding Cup -- Pharyngeal -- Pharyngeal- Honey Teaspoon Penetration/Aspiration during swallow;Reduced airway/laryngeal closure;Delayed swallow initiation-vallecula;Reduced tongue base retraction;Pharyngeal residue - valleculae Pharyngeal Material enters airway, passes BELOW cords without attempt by patient to eject out (silent aspiration) Pharyngeal- Honey Cup Penetration/Aspiration during swallow;Reduced airway/laryngeal closure;Delayed swallow initiation-vallecula;Reduced tongue base retraction;Pharyngeal residue - valleculae;Penetration/Apiration after swallow Pharyngeal Material enters airway, passes BELOW cords without attempt by patient to eject out (silent aspiration) Pharyngeal- Nectar Teaspoon -- Pharyngeal -- Pharyngeal- Nectar Cup Penetration/Aspiration during swallow;Reduced airway/laryngeal closure;Pharyngeal residue - valleculae;Reduced tongue base retraction;Delayed swallow initiation-vallecula;Penetration/Apiration after swallow Pharyngeal Material enters airway, passes BELOW cords  without attempt by patient to eject out (silent aspiration) Pharyngeal- Nectar Straw Penetration/Aspiration during swallow;Reduced airway/laryngeal closure;Delayed swallow initiation-vallecula;Penetration/Apiration after swallow;Reduced tongue base retraction Pharyngeal Material enters airway, passes BELOW cords without attempt by patient to eject out (silent aspiration) Pharyngeal- Thin Teaspoon -- Pharyngeal -- Pharyngeal- Thin Cup Penetration/Aspiration during swallow;Pharyngeal residue - valleculae;Reduced airway/laryngeal closure;Reduced tongue base retraction Pharyngeal Material enters airway, passes BELOW cords without attempt by patient to eject out (silent aspiration) Pharyngeal- Thin Straw Penetration/Aspiration during swallow;Pharyngeal residue - valleculae;Reduced airway/laryngeal closure;Reduced tongue base retraction;Penetration/Apiration after swallow Pharyngeal Material enters airway, passes BELOW cords without attempt by patient to eject out (silent aspiration) Pharyngeal- Puree Penetration/Aspiration during swallow;Reduced airway/laryngeal closure;Penetration/Apiration after swallow Pharyngeal Material enters airway, CONTACTS cords and not ejected out Pharyngeal- Mechanical Soft Penetration/Aspiration during swallow;Reduced airway/laryngeal closure;Penetration/Apiration after swallow Pharyngeal Material enters airway, remains ABOVE vocal cords and not ejected out Pharyngeal- Regular -- Pharyngeal -- Pharyngeal- Multi-consistency -- Pharyngeal -- Pharyngeal- Pill -- Pharyngeal -- Pharyngeal Comment --  CHL IP CERVICAL ESOPHAGEAL PHASE 04/06/2021 Cervical Esophageal Phase WFL Pudding Teaspoon -- Pudding Cup -- Honey Teaspoon -- Honey Cup -- Nectar Teaspoon -- Nectar Cup -- Nectar Straw -- Thin Teaspoon -- Thin Cup -- Thin Straw -- Puree -- Mechanical Soft -- Regular -- Multi-consistency -- Pill -- Cervical Esophageal Comment -- Note populated for Mamie Levers, Student SLP Mahala Menghini., M.A. CCC-SLP Acute  Rehabilitation Services Pager [phone] Office [phone] 04/06/2021, 3:49 PM              ECHOCARDIOGRAM COMPLETE  Result Date: 04/05/2021    ECHOCARDIOGRAM REPORT   Patient Name:   Marinell Menge Date of Exam: 04/05/2021 Medical Rec #:  [id number]        Height:       62.0 in Accession #:    [id number]       Weight:       121.3 lb Date of Birth:  1929/12/19        BSA:          1.545 m Patient Age:    89 years         BP:           155/94  mmHg Patient Gender: F                HR:           87 bpm. Exam Location:  Inpatient Procedure: 2D Echo Indications:    atrial fibrillation  History:        Patient has prior history of Echocardiogram examinations, most                 recent 08/22/2019. CAD, chronic kidney disease, Arrythmias:Atrial                 Fibrillation, Signs/Symptoms:Dyspnea; Risk Factors:Hypertension,                 Dyslipidemia and Former Smoker.  Sonographer:    Delcie Roch Referring Phys: [phone] WHITNEY F DAVIS IMPRESSIONS  1. Left ventricular ejection fraction, by estimation, is 60 to 65%. The left ventricle has normal function. The left ventricle has no regional wall motion abnormalities. There is moderate concentric left ventricular hypertrophy. Diastolic function indeterminant due to Afib.  2. Right ventricular systolic function is normal. The right ventricular size is normal. There is moderately elevated pulmonary artery systolic pressure. The estimated right ventricular systolic pressure is 47.0 mmHg.  3. Left atrial size was massively dilated.  4. Right atrial size was severely dilated.  5. 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 There is mild bowing of the mitral valve leaflets into the LA during systole without frank prolapse.  6. Tricuspid valve regurgitation is mild to moderate.  7. The aortic valve is tricuspid. There is mild calcification of the aortic valve. There is moderate thickening of the aortic valve. Aortic valve  regurgitation is trivial. Mild to moderate aortic valve sclerosis/calcification is present, without any evidence of aortic stenosis.  8. The inferior vena cava is normal in size with greater than 50% respiratory variability, suggesting right atrial pressure of 3 mmHg. Comparison(s): The prior echo in 2020 noted a dilated aortic root and ascending aorta which are not seen on current study however the ascending aorta is not visualized in it's entirety. Otherwise, there is no significant change. FINDINGS  Left Ventricle: Left ventricular ejection fraction, by estimation, is 60 to 65%. The left ventricle has normal function. The left ventricle has no regional wall motion abnormalities. The left ventricular internal cavity size was normal in size. There is  moderate concentric left ventricular hypertrophy. Diastolic function indeterminant due to Afib. Right Ventricle: The right ventricular size is normal. No increase in right ventricular wall thickness. Right ventricular systolic function is normal. There is moderately elevated pulmonary artery systolic pressure. The tricuspid regurgitant velocity is 3.24 m/s, and with an assumed right atrial pressure of 5 mmHg, the estimated right ventricular systolic pressure is 47.0 mmHg. Left Atrium: Left atrial size was massively dilated. Right Atrium: Right atrial size was severely dilated. Pericardium: There is no evidence of pericardial effusion. Mitral Valve: The mitral valve is myxomatous. There is mild thickening of the mitral valve leaflet(s). There is mild calcification of the mitral valve leaflet(s). Mild to moderate mitral annular calcification. Mild to moderate mitral valve regurgitation. Tricuspid Valve: The tricuspid valve is normal in structure. Tricuspid valve regurgitation is mild to moderate. Aortic Valve: The aortic valve is tricuspid. There is mild calcification of the aortic valve. There is moderate thickening of the aortic valve. Aortic valve regurgitation is  trivial. Mild to moderate aortic valve sclerosis/calcification is present, without any evidence of aortic stenosis. Pulmonic Valve: The pulmonic  valve was normal in structure. Pulmonic valve regurgitation is mild. Aorta: The aortic root and ascending aorta are structurally normal, with no evidence of dilitation. Venous: The inferior vena cava is normal in size with greater than 50% respiratory variability, suggesting right atrial pressure of 3 mmHg. IAS/Shunts: No atrial level shunt detected by color flow Doppler.  LEFT VENTRICLE PLAX 2D LVIDd:         3.60 cm LVIDs:         2.00 cm LV PW:         1.30 cm LV IVS:        1.40 cm LVOT diam:     1.80 cm LVOT Area:     2.54 cm  RIGHT VENTRICLE             IVC RV S prime:     10.80 cm/s  IVC diam: 1.90 cm TAPSE (M-mode): 1.6 cm LEFT ATRIUM              Index       RIGHT ATRIUM           Index LA diam:        5.30 cm  3.43 cm/m  RA Area:     29.00 cm LA Vol (A2C):   104.0 ml 67.30 ml/m RA Volume:   87.50 ml  56.62 ml/m LA Vol (A4C):   147.0 ml 95.12 ml/m LA Biplane Vol: 133.0 ml 86.06 ml/m   AORTA Ao Root diam: 2.60 cm Ao Asc diam:  2.90 cm MR Peak grad: 125.9 mmHg  TRICUSPID VALVE MR Mean grad: 72.0 mmHg   TV Peak grad:   33.2 mmHg MR Vmax:      561.00 cm/s TV Vmax:        2.88 m/s MR Vmean:     384.0 cm/s  TR Peak grad:   42.0 mmHg                           TR Vmax:        324.00 cm/s                            SHUNTS                           Systemic Diam: 1.80 cm Laurance Flatten MD Electronically signed by Laurance Flatten MD Signature Date/Time: 04/05/2021/6:57:29 PM    Final      Time coordinating discharge: Over 30 minutes    Lewie Chamber, MD  [facility] 04/13/2021, 3:03 PM

## 2021-04-11 NOTE — Progress Notes (Signed)
Daily Progress Note   Patient Name: Christina Ortiz       Date: 04/11/2021 DOB: 08-Aug-1929  Age: 87 y.o. MRN#: [id number] Attending Physician: Lewie Chamber, MD Primary Care Physician: No primary care provider on file. Admit Date: 04/03/2021  Reason for Consultation/Follow-up: Establishing goals of care  Subjective: Feels much better than yesterday  Length of Stay: 8  Current Medications: Scheduled Meds:  . chlorhexidine  15 mL Mouth Rinse BID  . Chlorhexidine Gluconate Cloth  6 each Topical Daily  . docusate  100 mg Per Tube BID  . levothyroxine  75 mcg Oral Q0600  . mouth rinse  15 mL Mouth Rinse q12n4p  . polyethylene glycol  17 g Per Tube Daily  . Rivaroxaban  15 mg Oral Q supper    Continuous Infusions:   PRN Meds: docusate sodium, haloperidol lactate, hydrALAZINE, ondansetron (ZOFRAN) IV, polyethylene glycol, Resource ThickenUp Clear  Physical Exam Pulmonary:     Effort: Pulmonary effort is normal.  Skin:    General: Skin is warm and dry.  Neurological:     Mental Status: She is alert. She is disoriented.             Vital Signs: BP 134/75 (BP Location: Left Arm)   Pulse 82   Temp 97.7 F (36.5 C) (Oral)   Resp 18   Ht 5\' 2"  (1.575 m)   Wt 56.5 kg   LMP  (LMP Unknown)   SpO2 95%   BMI 22.78 kg/m  SpO2: SpO2: 95 % O2 Device: O2 Device: Room Air O2 Flow Rate: O2 Flow Rate (L/min): 2 L/min  Intake/output summary:   Intake/Output Summary (Last 24 hours) at 04/11/2021 1416 Last data filed at 04/10/2021 2000 Gross per 24 hour  Intake 120 ml  Output --  Net 120 ml   LBM: Last BM Date: 04/10/21 Baseline Weight: Weight: 45.4 kg Most recent weight: Weight: 56.5 kg       Palliative Assessment/Data: PPS 40%    Flowsheet Rows   Flowsheet Row Most Recent Value   Intake Tab   Referral Department Critical care  Unit at Time of Referral Intermediate Care Unit  Palliative Care Primary Diagnosis Neurology  Date Notified 04/06/21  Palliative Care Type New Palliative care  Reason for referral Clarify Goals of Care  Date of Admission 04/03/21  Date first seen by Palliative Care 04/08/21  # of days Palliative referral response time 2 Day(s)  # of days IP prior to Palliative referral 3  Clinical Assessment   Palliative Performance Scale Score 40%  Psychosocial & Spiritual Assessment   Palliative Care Outcomes   Patient/Family meeting held? Yes  Who was at the meeting? daughter and SIL  Palliative Care Outcomes Clarified goals of care, Counseled regarding hospice, Provided end of life care assistance, Provided advance care planning, Provided psychosocial or spiritual support, Changed to focus on comfort, Transitioned to hospice      Patient Active Problem List   Diagnosis Date Noted  . Hypomagnesemia 04/08/2021  . Pulmonary edema cardiac cause (HCC) 04/03/2021  . Hip fx, right, closed, initial encounter (HCC) 01/24/2021  . Right hip pain 01/24/2021  . Leukocytosis 01/24/2021  . Depression 01/24/2021  . Surgical counseling  visit 12/10/2019  . History of esophageal stricture 11/02/2019  . Muscular deconditioning 11/01/2019  . Abnormal liver function tests   . Common bile duct calculus 10/16/2019  . Abnormal urinalysis 09/28/2019  . Adult failure to thrive syndrome 09/28/2019  . Dysphagia 09/06/2018  . Pain   . Closed fracture of femur, greater trochanter (HCC) 06/04/2018  . Hypoxia 06/03/2018  . Pain in right arm 04/10/2018  . Fall 03/31/2018  . Fracture of shaft of humerus 03/31/2018  . Disorder of skeletal muscle 03/31/2018  . Hypothermia 03/31/2018  . Healthcare maintenance 01/17/2018  . Nocturia 01/17/2018  . Macrocytosis 11/22/2017  . Acute bronchitis due to respiratory syncytial virus 11/09/2017  . Acute respiratory failure (HCC)  11/09/2017  . Hypertension 11/09/2017  . Paroxysmal atrial fibrillation (HCC) 11/09/2017  . Coronary artery disease 11/09/2017  . CKD (chronic kidney disease) stage 2, GFR 60-89 ml/min 11/09/2017  . Hypothyroidism 11/09/2017  . Dyspnea 11/08/2017  . Upper respiratory infection 11/08/2017  . Serum creatinine raised 06/21/2017  . Ankle edema 06/21/2017  . Diarrhea 05/25/2017  . Hypokalemia 05/25/2017  . Alcohol abuse 05/25/2017  . Recurrent depression (HCC) 03/30/2017  . Low back pain 03/30/2017  . Influenza 01/22/2017  . Coronary atherosclerosis 04/04/2015  . Hypertensive disorder 04/04/2015  . Hyperlipidemia 04/04/2015  . Ex-smoker 04/04/2015  . Chronic anticoagulation 04/04/2015    Palliative Care Assessment & Plan   HPI: 87 y.o.femalewith past medical history of PAF anticoagulated on half dose Eliquis, CKD, HTN, HLD, hypothyroidism, GERD, and dementiaadmitted on 4/8/2022with AMS, SOB, and HTN.Patient admitted for acute respiratory failureprecipitated by flash pulmonary edema, aspiration event, with underlying history of diastolic CHF, hypertensive urgency. Also with dysphagia - made NPO with ongoing aspiration. PMT consulted to discuss GOC.  Assessment: Better today than yesterday, feeding herself breakfast. Vitals stable. Call daughter [name] and discuss condition. [name] feels comfortable with dc to ALF with hospice to follow. We discuss potential scenarios at ALF. Review that she will still have hospice facility available if she rapidly declines at ALF and needs additional support. Discussed plan with Dr. Frederick Peers  Recommendations/Plan: Dc to ALF with hospice, family agrees  Code Status: DNR  Prognosis:  < 6 months  Discharge Planning: ALF with hospice  Care plan was discussed with daughter, Dr. Frederick Peers  Thank you for allowing the Palliative Medicine Team to assist in the care of this patient.   Total Time 20  minutes Prolonged Time Billed  no        Greater than 50%  of this time was spent counseling and coordinating care related to the above assessment and plan.  Gerlean Ren, DNP, [facility] Palliative Medicine Team Team Phone # [phone]  Pager [phone]

## 2021-04-11 NOTE — Discharge Summary (Incomplete Revision)
Physician Discharge Summary   Kayliee Atienza AVW:[id number] DOB: [birth year] DOA: 04/03/2021  PCP: No primary care provider on file.  Admit date: 04/03/2021 Discharge date:  04/11/2021  Admitted From: ALF Disposition:  [facility] Discharging physician: Lewie Chamber, MD  Recommendations for Outpatient Follow-up:  1. Patient discharging with hospice in place. If further decline consider further de-escalation of meds and pursuing comfort care    Patient discharged to [facility] in Discharge Condition: fair Risk of unplanned readmission score: Unplanned Admission- Pilot do not use: 19.47  CODE STATUS: DNR Diet recommendation:  Diet Orders (From admission, onward)    Start     Ordered   04/08/21 1551  DIET - DYS 1 Room service appropriate? Yes; Fluid consistency: Thin  Diet effective now       Question Answer Comment  Room service appropriate? Yes   Fluid consistency: Thin      04/08/21 1550          Hospital Course: Patient is a 87 year old female with history of paroxysmal A. fib on Eliquis, CKD, hypertension, hyperlipidemia, hypothyroidism, GERD, mild dementia presented from local ALF with altered mental status, shortness of breath and severe hypertension. Patient's daughter had reported altered mental status with an episode of vomiting followed by hypoxia later severe hypertension. In ED, patient was hypertensive with BP 203/137, O2 sats appropriate for 4 L [place]. Patient was placed on nitro drip for hypertensive urgency. However due to worsening mental status, hypoxic and hypotension, nitro drip was discontinued, placed BiPAP and patient was admitted to ICU for further work-up. She overall improved clinically but had transient times of decreased mentation/lethargy and poor appetite. With further GOC discussions with daughter and palliative care, plan was to involve hospice at discharge and for patient to return to [facility]. She may need long term SNF at some point if she  declines further vs home with family for hospice/end of life care.  In regards to her dysphagia, she underwent swallow eval and again after family discussion we decided to allow her to have a dysphagia diet and family understands aspiration risk still. If she does aspirate and develop respiratory distress and/or PNA, would also involve hospice to determine plan at that point also.   Acute respiratory failure with hypoxia -Multifactorial, precipitated by flash pulmonary edema, aspiration event, with underlying history of diastolic CHF, hypertensive urgency -Off the BiPAP, chest x-ray showed persistent cardiomegaly with left base atelectasis. -O2 sats 96-99% on 2 L O2 via [place], wean as tolerated.  Now on room air -2D echo 4/10 showed EF 60-65%, diastolic function indeterminate. Mild to moderate mitral valve regurgitation -Aspiration precautions  Hypokalemia, hypomagnesemia -Aggressively replaced IV  Hypoglycemia -Has been on D10 infusion.  Questionable reliability with CBG checks however glucose has been low on BMP at times.  Diet has now been resumed (dysphagia) -Discontinue D10 infusion, continue CBG checks for now.  Glucose stable after D10 stopped and she is eating fairly well -Glucose levels remaining stable on 04/10/2021 -Continue dysphagia diet  Acute metabolic encephalopathy - resolved  -Multifactorial in the setting of hypertensive urgency, delirium, infection -Currently alert and oriented, improving -Delirium and aspiration precautions  Dysphagia likely due to aspiration event -Family discussion held with palliative care.  Plan is for resuming dysphagia diet and continuing aspiration precautions; family understands the risk -Continue aspiration precautions - on 4/16, she appears a little better than she did on 4/15 so will try for discharge to [facility]  Hypothyroidism -TSH 4.7, free T4 1.1 -Patient on  Synthroid 75 MCG daily outpatient - resume oral  synthroid  History of paroxysmal atrial fibrillation -Rate controlled - d/c heparin drip now that taking PO again - resume xarelto    Principal Diagnosis: Acute metabolic encephalopathy  Discharge Diagnoses: Active Hospital Problems   Diagnosis Date Noted  . Hypomagnesemia 04/08/2021  . Pulmonary edema cardiac cause (HCC) 04/03/2021  . Dysphagia 09/06/2018  . Hypertension 11/09/2017  . Hypothyroidism 11/09/2017  . Paroxysmal atrial fibrillation (HCC) 11/09/2017  . Hypokalemia 05/25/2017    Resolved Hospital Problems   Diagnosis Date Noted Date Resolved  . Acute metabolic encephalopathy 04/08/2021 04/11/2021  . Acute respiratory failure with hypoxia [facility]) 04/08/2021 04/11/2021    Discharge Instructions    Increase activity slowly   Complete by: As directed      Allergies as of 04/11/2021   No Known Allergies     Medication List    STOP taking these medications   B COMPLEX 100 PO   buPROPion 150 MG 24 hr tablet Commonly known as: Wellbutrin XL   CALCIUM-MAGNESIUM-ZINC PO   ferrous sulfate 325 (65 FE) MG tablet   HYDROcodone-acetaminophen 5-325 MG tablet Commonly known as: Norco   potassium chloride SA 20 MEQ tablet Commonly known as: KLOR-CON   simvastatin 5 MG tablet Commonly known as: ZOCOR   spironolactone 25 MG tablet Commonly known as: ALDACTONE     TAKE these medications   acetaminophen 325 MG tablet Commonly known as: TYLENOL Take 2 tablets (650 mg total) by mouth every 6 (six) hours as needed for fever (or pain).   cyanocobalamin 100 MCG tablet Take 1 tablet (100 mcg total) by mouth daily.   Ensure Take 237 mLs by mouth in the morning and at bedtime.   folic acid 1 MG tablet Commonly known as: FOLVITE Take 1 tablet (1 mg total) by mouth daily.   levothyroxine 75 MCG tablet Commonly known as: SYNTHROID Take 1 tablet (75 mcg total) by mouth daily before breakfast. **PATIENT NEEDS LAB FOR FURTHER REFILLS** What changed: additional  instructions   lidocaine 5 % Commonly known as: LIDODERM Place 1 patch onto the skin daily. Remove & Discard patch within 12 hours or as directed by MD   lisinopril 2.5 MG tablet Commonly known as: ZESTRIL Take 1 tablet (2.5 mg total) by mouth in the morning.   Listerine Antiseptic Liqd 15 mLs by Mouth Rinse route in the morning.   melatonin 5 MG Tabs Take 1 tablet (5 mg total) by mouth at bedtime.   metoprolol tartrate 25 MG tablet Commonly known as: LOPRESSOR Take 0.5 tablets (12.5 mg total) by mouth 2 (two) times daily. **NEEDS APT FOR FURTHER MED REFILLS** What changed:   when to take this  additional instructions   omeprazole 20 MG capsule Commonly known as: PRILOSEC TAKE 1 CAPSULE BY MOUTH DAILY AS NEEDED FOR HEARTBURN. What changed: See the new instructions.   PARoxetine 40 MG tablet Commonly known as: PAXIL Take 1 tablet (40 mg total) by mouth in the morning. What changed: Another medication with the same name was removed. Continue taking this medication, and follow the directions you see here.   polyethylene glycol 17 g packet Commonly known as: MIRALAX / GLYCOLAX Take 17 g by mouth daily as needed for moderate constipation. What changed:   when to take this  reasons to take this   Rivaroxaban 15 MG Tabs tablet Commonly known as: Xarelto Take 1 tablet (15 mg total) by mouth daily with supper.   senna-docusate 8.6-50 MG tablet  Commonly known as: Senokot-S Take 1 tablet by mouth at bedtime.   Systane Balance 0.6 % Soln Generic drug: Propylene Glycol Place 1 drop into both eyes in the morning.   thiamine 100 MG tablet Commonly known as: Vitamin B-1 Take 1 tablet (100 mg total) by mouth daily.   Vitamin D3 25 MCG (1000 UT) Caps Take 1 capsule (1,000 Units total) by mouth in the morning.       No Known Allergies   Discharge Exam: BP 134/75 (BP Location: Left Arm)   Pulse 82   Temp 97.7 F (36.5 C) (Oral)   Resp 18   Ht 5\' 2"  (1.575 m)    Wt 56.5 kg   LMP  (LMP Unknown)   SpO2 95%   BMI 22.78 kg/m  General appearance: more awake/alert today, energy has improved Head: Normocephalic, without obvious abnormality, atraumatic Eyes: EOMI Lungs: clear to auscultation bilaterally Heart: irregularly irregular rhythm and S1, S2 normal Abdomen: normal findings: bowel sounds normal and soft, non-tender Extremities: edema noted in UE's Skin: frail skin Neurologic: Grossly normal  The results of significant diagnostics from this hospitalization (including imaging, microbiology, ancillary and laboratory) are listed below for reference.   Microbiology: Recent Results (from the past 240 hour(s))  Resp Panel by RT-PCR (Flu A&B, Covid) Nasopharyngeal Swab     Status: None   Collection Time: 04/03/21  1:54 PM   Specimen: Nasopharyngeal Swab; Nasopharyngeal(NP) swabs in vial transport medium  Result Value Ref Range Status   SARS Coronavirus 2 by RT PCR NEGATIVE NEGATIVE Final    Comment: (NOTE) SARS-CoV-2 target nucleic acids are NOT DETECTED.  The SARS-CoV-2 RNA is generally detectable in upper respiratory specimens during the acute phase of infection. The lowest concentration of SARS-CoV-2 viral copies this assay can detect is 138 copies/mL. A negative result does not preclude SARS-Cov-2 infection and should not be used as the sole basis for treatment or other patient management decisions. A negative result may occur with  improper specimen collection/handling, submission of specimen other than nasopharyngeal swab, presence of viral mutation(s) within the areas targeted by this assay, and inadequate number of viral copies(<138 copies/mL). A negative result must be combined with clinical observations, patient history, and epidemiological information. The expected result is Negative.  Fact Sheet for Patients:  BloggerCourse.com  Fact Sheet for Healthcare Providers:   SeriousBroker.it  This test is no t yet approved or cleared by the [place] FDA and  has been authorized for detection and/or diagnosis of SARS-CoV-2 by FDA under an Emergency Use Authorization (EUA). This EUA will remain  in effect (meaning this test can be used) for the duration of the COVID-19 declaration under Section 564(b)(1) of the Act, 21 U.S.C.section 360bbb-3(b)(1), unless the authorization is terminated  or revoked sooner.       Influenza A by PCR NEGATIVE NEGATIVE Final   Influenza B by PCR NEGATIVE NEGATIVE Final    Comment: (NOTE) The Xpert Xpress SARS-CoV-2/FLU/RSV plus assay is intended as an aid in the diagnosis of influenza from Nasopharyngeal swab specimens and should not be used as a sole basis for treatment. Nasal washings and aspirates are unacceptable for Xpert Xpress SARS-CoV-2/FLU/RSV testing.  Fact Sheet for Patients: BloggerCourse.com  Fact Sheet for Healthcare Providers: SeriousBroker.it  This test is not yet approved or cleared by the [place] FDA and has been authorized for detection and/or diagnosis of SARS-CoV-2 by FDA under an Emergency Use Authorization (EUA). This EUA will remain in effect (meaning this test can be  used) for the duration of the COVID-19 declaration under Section 564(b)(1) of the Act, 21 U.S.C. section 360bbb-3(b)(1), unless the authorization is terminated or revoked.  Performed at [facility], [address], [place], [place] [address]   MRSA PCR Screening     Status: Abnormal   Collection Time: 04/03/21  8:12 PM   Specimen: Nasal Mucosa; Nasopharyngeal  Result Value Ref Range Status   MRSA by PCR POSITIVE (A) NEGATIVE Final    Comment:        The GeneXpert MRSA Assay (FDA approved for NASAL specimens only), is one component of a comprehensive MRSA colonization surveillance program. It is not intended to diagnose  MRSA infection nor to guide or monitor treatment for MRSA infections. RESULT CALLED TO, READ BACK BY AND VERIFIED WITHChalmers Guest RN [id number] 04/03/21 A BROWNING Performed at [facility], [address] [place], [place] [address]   Culture, blood (routine x 2)     Status: None   Collection Time: 04/03/21  8:14 PM   Specimen: BLOOD RIGHT ARM  Result Value Ref Range Status   Specimen Description BLOOD RIGHT ARM  Final   Special Requests   Final    BOTTLES DRAWN AEROBIC ONLY Blood Culture results may not be optimal due to an inadequate volume of blood received in culture bottles   Culture   Final    NO GROWTH 5 DAYS Performed at [facility], [address] [place], [place] [address]    Report Status 04/08/2021 FINAL  Final  Culture, blood (routine x 2)     Status: None   Collection Time: 04/03/21  8:25 PM   Specimen: BLOOD LEFT ARM  Result Value Ref Range Status   Specimen Description BLOOD LEFT ARM  Final   Special Requests   Final    BOTTLES DRAWN AEROBIC AND ANAEROBIC Blood Culture results may not be optimal due to an inadequate volume of blood received in culture bottles   Culture   Final    NO GROWTH 5 DAYS Performed at [facility], [address], [place], [place] [address]    Report Status 04/08/2021 FINAL  Final  Urine Culture     Status: None   Collection Time: 04/03/21  8:50 PM   Specimen: Urine, Random  Result Value Ref Range Status   Specimen Description URINE, RANDOM  Final   Special Requests NONE  Final   Culture   Final    NO GROWTH Performed at [facility], [address] [place], [place] [address]    Report Status 04/05/2021 FINAL  Final  Resp Panel by RT-PCR (Flu A&B, Covid) Nasopharyngeal Swab     Status: None   Collection Time: 04/11/21 10:00 AM   Specimen: Nasopharyngeal Swab; Nasopharyngeal(NP) swabs in vial transport medium  Result Value Ref Range Status   SARS Coronavirus 2 by RT PCR NEGATIVE NEGATIVE Final    Comment:  (NOTE) SARS-CoV-2 target nucleic acids are NOT DETECTED.  The SARS-CoV-2 RNA is generally detectable in upper respiratory specimens during the acute phase of infection. The lowest concentration of SARS-CoV-2 viral copies this assay can detect is 138 copies/mL. A negative result does not preclude SARS-Cov-2 infection and should not be used as the sole basis for treatment or other patient management decisions. A negative result may occur with  improper specimen collection/handling, submission of specimen other than nasopharyngeal swab, presence of viral mutation(s) within the areas targeted by this assay, and inadequate  number of viral copies(<138 copies/mL). A negative result must be combined with clinical observations, patient history, and epidemiological information. The expected result is Negative.  Fact Sheet for Patients:  BloggerCourse.com  Fact Sheet for Healthcare Providers:  SeriousBroker.it  This test is no t yet approved or cleared by the [place] FDA and  has been authorized for detection and/or diagnosis of SARS-CoV-2 by FDA under an Emergency Use Authorization (EUA). This EUA will remain  in effect (meaning this test can be used) for the duration of the COVID-19 declaration under Section 564(b)(1) of the Act, 21 U.S.C.section 360bbb-3(b)(1), unless the authorization is terminated  or revoked sooner.       Influenza A by PCR NEGATIVE NEGATIVE Final   Influenza B by PCR NEGATIVE NEGATIVE Final    Comment: (NOTE) The Xpert Xpress SARS-CoV-2/FLU/RSV plus assay is intended as an aid in the diagnosis of influenza from Nasopharyngeal swab specimens and should not be used as a sole basis for treatment. Nasal washings and aspirates are unacceptable for Xpert Xpress SARS-CoV-2/FLU/RSV testing.  Fact Sheet for Patients: BloggerCourse.com  Fact Sheet for Healthcare  Providers: SeriousBroker.it  This test is not yet approved or cleared by the [place] FDA and has been authorized for detection and/or diagnosis of SARS-CoV-2 by FDA under an Emergency Use Authorization (EUA). This EUA will remain in effect (meaning this test can be used) for the duration of the COVID-19 declaration under Section 564(b)(1) of the Act, 21 U.S.C. section 360bbb-3(b)(1), unless the authorization is terminated or revoked.  Performed at [facility], [address], [place], [place] [address]      Labs: BNP (last 3 results) Recent Labs    04/03/21 1352 04/06/21 1004  BNP 1,105.1* 593.2*   Basic Metabolic Panel: Recent Labs  Lab 04/07/21 0327 04/08/21 0405 04/08/21 1731 04/09/21 0251 04/10/21 0315  NA 136 133* 132* 133* 135  K 2.4* 2.7* 3.2* 4.3 4.0  CL 97* 94* 93* 96* 100  CO2 29 28 26 27 28   GLUCOSE 104* 82 97 84 76  BUN 18 12 10 12 19   CREATININE 0.96 0.86 0.88 0.95 0.91  CALCIUM 9.0 9.0 8.8* 9.1 9.3  MG 1.5* 2.2  --  1.7 1.8   Liver Function Tests: No results for input(s): AST, ALT, ALKPHOS, BILITOT, PROT, ALBUMIN in the last 168 hours. No results for input(s): LIPASE, AMYLASE in the last 168 hours. No results for input(s): AMMONIA in the last 168 hours. CBC: Recent Labs  Lab 04/06/21 0203 04/07/21 0327 04/08/21 0405 04/09/21 0251 04/10/21 0315  WBC 13.5* 10.7* 9.9 11.6* 10.2  NEUTROABS  --   --   --  9.2* 7.6  HGB 14.3 14.5 15.3* 14.8 15.4*  HCT 43.1 44.2 46.1* 44.7 46.2*  MCV 99.8 98.4 98.3 97.2 97.7  PLT 193 235 252 238 225   Cardiac Enzymes: No results for input(s): CKTOTAL, CKMB, CKMBINDEX, TROPONINI in the last 168 hours. BNP: Invalid input(s): POCBNP CBG: Recent Labs  Lab 04/10/21 1231 04/10/21 1602 04/10/21 2033 04/10/21 2324 04/11/21 0408  GLUCAP 139* 142* 121* 110* 108*   D-Dimer No results for input(s): DDIMER in the last 72 hours. Hgb A1c No results for input(s): HGBA1C in the  last 72 hours. Lipid Profile No results for input(s): CHOL, HDL, LDLCALC, TRIG, CHOLHDL, LDLDIRECT in the last 72 hours. Thyroid function studies No results for input(s): TSH, T4TOTAL, T3FREE, THYROIDAB in the last 72 hours.  Invalid input(s): FREET3 Anemia work up No results for input(s):  VITAMINB12, FOLATE, FERRITIN, TIBC, IRON, RETICCTPCT in the last 72 hours. Urinalysis    Component Value Date/Time   COLORURINE YELLOW 04/03/2021 1630   APPEARANCEUR HAZY (A) 04/03/2021 1630   LABSPEC >1.030 (H) 04/03/2021 1630   PHURINE 5.0 04/03/2021 1630   GLUCOSEU NEGATIVE 04/03/2021 1630   HGBUR SMALL (A) 04/03/2021 1630   BILIRUBINUR SMALL (A) 04/03/2021 1630   BILIRUBINUR negative 08/02/2020 1543   BILIRUBINUR negative 09/27/2019 1041   KETONESUR NEGATIVE 04/03/2021 1630   PROTEINUR 100 (A) 04/03/2021 1630   UROBILINOGEN 0.2 08/02/2020 1543   UROBILINOGEN 1.0 04/12/2017 1157   NITRITE NEGATIVE 04/03/2021 1630   LEUKOCYTESUR NEGATIVE 04/03/2021 1630   Sepsis Labs Invalid input(s): PROCALCITONIN,  WBC,  LACTICIDVEN Microbiology Recent Results (from the past 240 hour(s))  Resp Panel by RT-PCR (Flu A&B, Covid) Nasopharyngeal Swab     Status: None   Collection Time: 04/03/21  1:54 PM   Specimen: Nasopharyngeal Swab; Nasopharyngeal(NP) swabs in vial transport medium  Result Value Ref Range Status   SARS Coronavirus 2 by RT PCR NEGATIVE NEGATIVE Final    Comment: (NOTE) SARS-CoV-2 target nucleic acids are NOT DETECTED.  The SARS-CoV-2 RNA is generally detectable in upper respiratory specimens during the acute phase of infection. The lowest concentration of SARS-CoV-2 viral copies this assay can detect is 138 copies/mL. A negative result does not preclude SARS-Cov-2 infection and should not be used as the sole basis for treatment or other patient management decisions. A negative result may occur with  improper specimen collection/handling, submission of specimen other than  nasopharyngeal swab, presence of viral mutation(s) within the areas targeted by this assay, and inadequate number of viral copies(<138 copies/mL). A negative result must be combined with clinical observations, patient history, and epidemiological information. The expected result is Negative.  Fact Sheet for Patients:  BloggerCourse.com  Fact Sheet for Healthcare Providers:  SeriousBroker.it  This test is no t yet approved or cleared by the [place] FDA and  has been authorized for detection and/or diagnosis of SARS-CoV-2 by FDA under an Emergency Use Authorization (EUA). This EUA will remain  in effect (meaning this test can be used) for the duration of the COVID-19 declaration under Section 564(b)(1) of the Act, 21 U.S.C.section 360bbb-3(b)(1), unless the authorization is terminated  or revoked sooner.       Influenza A by PCR NEGATIVE NEGATIVE Final   Influenza B by PCR NEGATIVE NEGATIVE Final    Comment: (NOTE) The Xpert Xpress SARS-CoV-2/FLU/RSV plus assay is intended as an aid in the diagnosis of influenza from Nasopharyngeal swab specimens and should not be used as a sole basis for treatment. Nasal washings and aspirates are unacceptable for Xpert Xpress SARS-CoV-2/FLU/RSV testing.  Fact Sheet for Patients: BloggerCourse.com  Fact Sheet for Healthcare Providers: SeriousBroker.it  This test is not yet approved or cleared by the [place] FDA and has been authorized for detection and/or diagnosis of SARS-CoV-2 by FDA under an Emergency Use Authorization (EUA). This EUA will remain in effect (meaning this test can be used) for the duration of the COVID-19 declaration under Section 564(b)(1) of the Act, 21 U.S.C. section 360bbb-3(b)(1), unless the authorization is terminated or revoked.  Performed at [facility] Lab, [address] [place], [place] [address]    MRSA PCR Screening     Status: Abnormal   Collection Time: 04/03/21  8:12 PM   Specimen: Nasal Mucosa; Nasopharyngeal  Result Value Ref Range Status   MRSA by PCR POSITIVE (A) NEGATIVE Final  Comment:        The GeneXpert MRSA Assay (FDA approved for NASAL specimens only), is one component of a comprehensive MRSA colonization surveillance program. It is not intended to diagnose MRSA infection nor to guide or monitor treatment for MRSA infections. RESULT CALLED TO, READ BACK BY AND VERIFIED WITHChalmers Guest RN 1779 04/03/21 A BROWNING Performed at [facility], [address], [place], [place] [address]   Culture, blood (routine x 2)     Status: None   Collection Time: 04/03/21  8:14 PM   Specimen: BLOOD RIGHT ARM  Result Value Ref Range Status   Specimen Description BLOOD RIGHT ARM  Final   Special Requests   Final    BOTTLES DRAWN AEROBIC ONLY Blood Culture results may not be optimal due to an inadequate volume of blood received in culture bottles   Culture   Final    NO GROWTH 5 DAYS Performed at [facility], [address] [place], [place] [address]    Report Status 04/08/2021 FINAL  Final  Culture, blood (routine x 2)     Status: None   Collection Time: 04/03/21  8:25 PM   Specimen: BLOOD LEFT ARM  Result Value Ref Range Status   Specimen Description BLOOD LEFT ARM  Final   Special Requests   Final    BOTTLES DRAWN AEROBIC AND ANAEROBIC Blood Culture results may not be optimal due to an inadequate volume of blood received in culture bottles   Culture   Final    NO GROWTH 5 DAYS Performed at [facility] Lab, [address], [place], [place] [address]    Report Status 04/08/2021 FINAL  Final  Urine Culture     Status: None   Collection Time: 04/03/21  8:50 PM   Specimen: Urine, Random  Result Value Ref Range Status   Specimen Description URINE, RANDOM  Final   Special Requests NONE  Final   Culture   Final    NO GROWTH Performed at [facility], [address], [place], [place] [address]    Report Status 04/05/2021 FINAL  Final  Resp Panel by RT-PCR (Flu A&B, Covid) Nasopharyngeal Swab     Status: None   Collection Time: 04/11/21 10:00 AM   Specimen: Nasopharyngeal Swab; Nasopharyngeal(NP) swabs in vial transport medium  Result Value Ref Range Status   SARS Coronavirus 2 by RT PCR NEGATIVE NEGATIVE Final    Comment: (NOTE) SARS-CoV-2 target nucleic acids are NOT DETECTED.  The SARS-CoV-2 RNA is generally detectable in upper respiratory specimens during the acute phase of infection. The lowest concentration of SARS-CoV-2 viral copies this assay can detect is 138 copies/mL. A negative result does not preclude SARS-Cov-2 infection and should not be used as the sole basis for treatment or other patient management decisions. A negative result may occur with  improper specimen collection/handling, submission of specimen other than nasopharyngeal swab, presence of viral mutation(s) within the areas targeted by this assay, and inadequate number of viral copies(<138 copies/mL). A negative result must be combined with clinical observations, patient history, and epidemiological information. The expected result is Negative.  Fact Sheet for Patients:  BloggerCourse.com  Fact Sheet for Healthcare Providers:  SeriousBroker.it  This test is no t yet approved or cleared by the [place] FDA and  has been authorized for detection and/or diagnosis of SARS-CoV-2 by FDA under an Emergency Use Authorization (EUA). This EUA will remain  in effect (meaning this test  can be used) for the duration of the COVID-19 declaration under Section 564(b)(1) of the Act, 21 U.S.C.section 360bbb-3(b)(1), unless the authorization is terminated  or revoked sooner.       Influenza A by PCR NEGATIVE NEGATIVE Final   Influenza B by PCR NEGATIVE NEGATIVE Final    Comment: (NOTE) The Xpert Xpress  SARS-CoV-2/FLU/RSV plus assay is intended as an aid in the diagnosis of influenza from Nasopharyngeal swab specimens and should not be used as a sole basis for treatment. Nasal washings and aspirates are unacceptable for Xpert Xpress SARS-CoV-2/FLU/RSV testing.  Fact Sheet for Patients: BloggerCourse.com  Fact Sheet for Healthcare Providers: SeriousBroker.it  This test is not yet approved or cleared by the [place] FDA and has been authorized for detection and/or diagnosis of SARS-CoV-2 by FDA under an Emergency Use Authorization (EUA). This EUA will remain in effect (meaning this test can be used) for the duration of the COVID-19 declaration under Section 564(b)(1) of the Act, 21 U.S.C. section 360bbb-3(b)(1), unless the authorization is terminated or revoked.  Performed at [facility], [address] [place], [place] [address]     Procedures/Studies: CT Head Wo Contrast  Result Date: 04/03/2021 CLINICAL DATA:  Altered mental status EXAM: CT HEAD WITHOUT CONTRAST TECHNIQUE: Contiguous axial images were obtained from the base of the skull through the vertex without intravenous contrast. COMPARISON:  January 24, 2021 FINDINGS: Brain: Moderate diffuse atrophy is stable. There is no intracranial mass, hemorrhage, extra-axial fluid collection, or midline shift. There is decreased attenuation in portions of the centra semiovale bilaterally. There is small vessel disease in the anterior limb of each external capsule. Probable small lacunar infarcts noted in each inferior centrum semiovale. There is evidence of a prior small infarct in the lateral right cerebellum somewhat posterior in location, stable. No acute infarct appreciable. Vascular: No hyperdense vessels. There is calcification in each carotid siphon region. Skull: The bony calvarium appears intact. Sinuses/Orbits: Paranasal sinuses clear. Orbits appear symmetric bilaterally.  Other: Mastoid air cells clear. IMPRESSION: Atrophy with stable supratentorial small vessel disease. 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 No acute appearing infarct is demonstrated on this study. No mass or hemorrhage. There are foci of arterial vascular calcification. Electronically Signed   By: Bretta Bang III M.D.   On: 04/03/2021 16:36   DG CHEST PORT 1 VIEW  Result Date: 04/06/2021 CLINICAL DATA:  Hypoxia EXAM: PORTABLE CHEST 1 VIEW COMPARISON:  04/03/2021 FINDINGS: Cardiomegaly. Worsening airspace disease in the left mid and lower lung. Vascular congestion with right perihilar and infrahilar opacities. Small bilateral effusions. Aortic atherosclerosis. No acute bony abnormality. IMPRESSION: Cardiomegaly, vascular congestion. Worsening patchy airspace disease in the left mid lung and right lower lobe. Findings concerning for pneumonia. Electronically Signed   By: Charlett Nose M.D.   On: 04/06/2021 08:50   DG Chest Port 1 View  Result Date: 04/03/2021 CLINICAL DATA:  Shortness of breath and decreased oxygen saturation EXAM: PORTABLE CHEST 1 VIEW COMPARISON:  July 23, 2020 FINDINGS: There is atelectatic change in the left base region. No edema or airspace opacity. There is again noted generalized cardiac enlargement with pulmonary vascularity normal. No adenopathy. There is aortic atherosclerosis. No bone lesions. IMPRESSION: Persistent cardiomegaly. Left base atelectasis. Lungs elsewhere clear. No adenopathy evident. Aortic Atherosclerosis (ICD10-I70.0). Electronically Signed   By: Bretta Bang III M.D.   On: 04/03/2021 14:42   DG Swallowing Func-Speech Pathology  Result Date: 04/06/2021 Objective Swallowing Evaluation: Type of Study: MBS-Modified Barium  Swallow Study  Patient Details Name: Orean Giarratano MRN: [id number] Date of Birth: 1929-04-23 Today's Date: 04/06/2021 Time: SLP Start Time (ACUTE ONLY): 1215 -SLP Stop Time (ACUTE ONLY): 1236 SLP Time Calculation  (min) (ACUTE ONLY): 21 min Past Medical History: Past Medical History: Diagnosis Date . Acute bronchitis 10/2017 . Arthritis  . CKD (chronic kidney disease)   Xarelto dose is 15 mg QD . Coronary artery disease   a. s/p PCI/stenting to RCA and LAD ([facility] [place]) in 2006; b. Nuc 1/15: No ischemia, EF 70% . Depression  . Gallstones  . Gastritis  . GERD (gastroesophageal reflux disease)  . Hiatal hernia  . Hip fracture (HCC)  . History of cholecystectomy  . History of echocardiogram   a. Echo 1/15: Moderate LVH, EF 55-60%, impaired relaxation, mild AS . Hyperlipidemia  . Hypertension  . Hypokalemia  . Hypothyroidism  . Mobitz type 1 second degree atrioventricular block   a. Event Monitor 3/15: NSR, first-degree AV block, second-degree AB block - Mobitz 1, PACs, NSVT (5 beats) . PAF (paroxysmal atrial fibrillation) (HCC)  . Thyroid disease  Past Surgical History: Past Surgical History: Procedure Laterality Date . ANGIOPLASTY  2006  with sent placement . BACK SURGERY    Dr Shon Baton . BILIARY DILATION  10/18/2019  Procedure: BILIARY DILATION;  Surgeon: Rachael Fee, MD;  Location: [facility];  Service: Endoscopy;; . BILIARY DILATION  12/17/2019  Procedure: BILIARY DILATION;  Surgeon: [name], MD;  Location: [facility] ENDOSCOPY;  Service: Endoscopy;; . BILIARY DILATION  02/07/2020  Procedure: BILIARY DILATION;  Surgeon: Lemar Lofty., MD;  Location: [facility] ENDOSCOPY;  Service: Gastroenterology;; . BILIARY DILATION  09/10/2020  Procedure: BILIARY DILATION;  Surgeon: Lemar Lofty., MD;  Location: [facility] ENDOSCOPY;  Service: Gastroenterology;; . BILIARY STENT PLACEMENT N/A 10/18/2019  Procedure: BILIARY STENT PLACEMENT;  Surgeon: Rachael Fee, MD;  Location: [facility] ENDOSCOPY;  Service: Endoscopy;  Laterality: N/A; . BILIARY STENT PLACEMENT N/A 12/17/2019  Procedure: BILIARY STENT PLACEMENT;  Surgeon: [name], MD;  Location: [facility] ENDOSCOPY;  Service: Endoscopy;  Laterality: N/A; . BILIARY  STENT PLACEMENT  02/07/2020  Procedure: BILIARY STENT PLACEMENT;  Surgeon: Meridee Score Netty Starring., MD;  Location: [facility] ENDOSCOPY;  Service: Gastroenterology;; . BLEPHAROPLASTY   . BREAST LUMPECTOMY Left  . CARDIAC CATHETERIZATION  2006 . CATARACT EXTRACTION Bilateral  . CHOLECYSTECTOMY   . DILATION AND CURETTAGE OF UTERUS   . ENDOSCOPIC RETROGRADE CHOLANGIOPANCREATOGRAPHY (ERCP) WITH PROPOFOL N/A 12/17/2019  Procedure: ENDOSCOPIC RETROGRADE CHOLANGIOPANCREATOGRAPHY (ERCP) WITH PROPOFOL;  Surgeon: [name], MD;  Location: [facility] ENDOSCOPY;  Service: Endoscopy;  Laterality: N/A; . ENDOSCOPIC RETROGRADE CHOLANGIOPANCREATOGRAPHY (ERCP) WITH PROPOFOL N/A 02/07/2020  Procedure: ENDOSCOPIC RETROGRADE CHOLANGIOPANCREATOGRAPHY (ERCP) WITH PROPOFOL;  Surgeon: Meridee Score Netty Starring., MD;  Location: [facility] ENDOSCOPY;  Service: Gastroenterology;  Laterality: N/A; . ENDOSCOPIC RETROGRADE CHOLANGIOPANCREATOGRAPHY (ERCP) WITH PROPOFOL N/A 09/10/2020  Procedure: ENDOSCOPIC RETROGRADE CHOLANGIOPANCREATOGRAPHY (ERCP) WITH PROPOFOL;  Surgeon: Meridee Score Netty Starring., MD;  Location: [facility] ENDOSCOPY;  Service: Gastroenterology;  Laterality: N/A; . ERCP N/A 10/18/2019  Procedure: ENDOSCOPIC RETROGRADE CHOLANGIOPANCREATOGRAPHY (ERCP);  Surgeon: Rachael Fee, MD;  Location: [facility]OS[facility];  Service: Endoscopy;  Laterality: N/A; . ESOPHAGOGASTRODUODENOSCOPY (EGD) WITH PROPOFOL N/A 12/17/2019  Procedure: ESOPHAGOGASTRODUODENOSCOPY (EGD) WITH PROPOFOL;  Surgeon: [name], MD;  Location: [facility] ENDOSCOPY;  Service: Endoscopy;  Laterality: N/A; . INTRAMEDULLARY (IM) NAIL INTERTROCHANTERIC Right 01/25/2021  Procedure: INTRAMEDULLARY (IM) NAIL INTERTROCHANTRIC;  Surgeon: Durene Romans, MD;  Location: [facility] OR;  Service: Orthopedics;  Laterality: Right; . KYPHOPLASTY N/A 01/03/2020  Procedure: KYPHOPLASTY LUMBAR TWO;  Surgeon: Venita Lick, MD;  Location: [facility] OR;  Service: Orthopedics;  Laterality: N/A; . REMOVAL OF STONES  10/18/2019  Procedure:  REMOVAL OF STONES;  Surgeon: Rachael Fee, MD;  Location: [facility] ENDOSCOPY;  Service: Endoscopy;; . REMOVAL OF STONES  12/17/2019  Procedure: REMOVAL OF STONES;  Surgeon: [name], MD;  Location: [facility] ENDOSCOPY;  Service: Endoscopy;; . REMOVAL OF STONES  02/07/2020  Procedure: REMOVAL OF STONES;  Surgeon: Lemar Lofty., MD;  Location: [facility] ENDOSCOPY;  Service: Gastroenterology;; . REMOVAL OF STONES  09/10/2020  Procedure: REMOVAL OF STONES;  Surgeon: Lemar Lofty., MD;  Location: [facility] ENDOSCOPY;  Service: Gastroenterology;; . Gaspar Bidding DILATION N/A 12/17/2019  Procedure: Gaspar Bidding DILATION;  Surgeon: [name], MD;  Location: [facility] ENDOSCOPY;  Service: Endoscopy;  Laterality: N/A; . SPHINCTEROTOMY  10/18/2019  Procedure: SPHINCTEROTOMY;  Surgeon: Rachael Fee, MD;  Location: [facility] ENDOSCOPY;  Service: Endoscopy;; . Dennison Mascot  12/17/2019  Procedure: Dennison Mascot;  Surgeon: [name], MD;  Location: [facility] ENDOSCOPY;  Service: Endoscopy;; . Burman Freestone CHOLANGIOSCOPY N/A 02/07/2020  Procedure: ZOXWRUEA CHOLANGIOSCOPY;  Surgeon: Lemar Lofty., MD;  Location: [facility] ENDOSCOPY;  Service: Gastroenterology;  Laterality: N/A; Burman Freestone LITHOTRIPSY N/A 02/07/2020  Procedure: [id number] LITHOTRIPSY;  Surgeon: Lemar Lofty., MD;  Location: [facility] ENDOSCOPY;  Service: Gastroenterology;  Laterality: N/A; . STENT REMOVAL  12/17/2019  Procedure: STENT REMOVAL;  Surgeon: [name], MD;  Location: [facility] ENDOSCOPY;  Service: Endoscopy;; . STENT REMOVAL  02/07/2020  Procedure: STENT REMOVAL;  Surgeon: Lemar Lofty., MD;  Location: [facility] ENDOSCOPY;  Service: Gastroenterology;; . Francine Graven REMOVAL  09/10/2020  Procedure: STENT REMOVAL;  Surgeon: Lemar Lofty., MD;  Location: [facility] ENDOSCOPY;  Service: Gastroenterology;; HPI: Pt is an 87 y.o. female with acute hypoxemic respiratory failure secondary to potential aspiration episode with vomiting. Also presents with sepsis secondary to  presumed UTI. CXR 4/11: Worsening patchy airspace disease in the left mid lung and right lower lobe. Esophagram (2019): Significant distal esophageal stricture above a moderate-sized hiatal hernia; Esophageal dysmotility. PMH: GERD, HTN, hiatal hernia, gastritis  Subjective: Pt remains pleasant with some confusion, requires cueing Assessment / Plan / Recommendation CHL IP CLINICAL IMPRESSIONS 04/06/2021 Clinical Impression Pt presents with an oropharyngeal dysphagia in which her oral phase is consistently characterized by reduced/delayed posterior propulsion, lingual residue, weak lingual manipulation, lingual pumping and decreased bolus cohesion that led to reduced oral control of boluses. Anterior spillage was also noted with thin liquids via the cup but this improved with trials of nectar and honey thick liquids. When given more solid textures, it appeared that the pt barely chewed the boluses before propelling them back even with verbal cues to chew thoroughly. Pt consistently demonstrated reduced airway protection in which timing and airway closure were not appropriately coordinated during her pharyngeal phase. This combined with her decreased oral control of boluses allowed for boluses to spill into her airway during her swallow resulting in silent aspiration (PAS 8) of all liquids and penetration across consistencies. Vallecular residue was also noted due to reduced base of tongue retraction which also resulted in aspiration of boluses after her initial swallow. She was unable to clear aspirates and penetrates even when given visual cues to cough. Although aspiration was not observed with solids, consistent penetration even to the vocal folds, as well as her inability to clear penetrates, is concerning for potential aspiration across meals. Given her cognition, SLP was unable to attempt compensatory strategies to examine if this could  help improve her airway protection. Recommend that the pt be NPO with f/u  for potential to participate in swallowing therapy, although baseline mentation and swallowing function is unknown to accurately assess prognosis. SLP discussed the results of this study with her NP who also agrees with this recommendation and plans to f/u further with pt/family.   SLP Visit Diagnosis Dysphagia, oropharyngeal phase (R13.12) Attention and concentration deficit following -- Frontal lobe and executive function deficit following -- Impact on safety and function Moderate aspiration risk   CHL IP TREATMENT RECOMMENDATION 04/06/2021 Treatment Recommendations Therapy as outlined in treatment plan below   Prognosis 04/06/2021 Prognosis for Safe Diet Advancement Fair Barriers to Reach Goals Cognitive deficits Barriers/Prognosis Comment -- CHL IP DIET RECOMMENDATION 04/06/2021 SLP Diet Recommendations NPO Liquid Administration via -- Medication Administration Via alternative means Compensations Slow rate;Small sips/bites;Minimize environmental distractions Postural Changes --   CHL IP OTHER RECOMMENDATIONS 04/06/2021 Recommended Consults -- Oral Care Recommendations Oral care QID Other Recommendations Order thickener from pharmacy;Prohibited food (jello, ice cream, thin soups);Remove water pitcher   CHL IP FOLLOW UP RECOMMENDATIONS 04/06/2021 Follow up Recommendations (No Data)   CHL IP FREQUENCY AND DURATION 04/06/2021 Speech Therapy Frequency (ACUTE ONLY) min 2x/week Treatment Duration 2 weeks      CHL IP ORAL PHASE 04/06/2021 Oral Phase Impaired Oral - Pudding Teaspoon -- Oral - Pudding Cup -- Oral - Honey Teaspoon Reduced posterior propulsion;Weak lingual manipulation;Lingual pumping Oral - Honey Cup Reduced posterior propulsion;Weak lingual manipulation;Lingual pumping Oral - Nectar Teaspoon -- Oral - Nectar Cup Lingual/palatal residue;Reduced posterior propulsion;Decreased bolus cohesion;Weak lingual manipulation;Lingual pumping Oral - Nectar Straw Lingual/palatal residue;Reduced posterior propulsion;Decreased  bolus cohesion;Weak lingual manipulation;Lingual pumping Oral - Thin Teaspoon -- Oral - Thin Cup Lingual/palatal residue;Decreased bolus cohesion;Left anterior bolus loss;Right anterior bolus loss;Premature spillage Oral - Thin Straw Lingual/palatal residue;Decreased bolus cohesion;Premature spillage Oral - Puree Reduced posterior propulsion;Weak lingual manipulation;Delayed oral transit Oral - Mech Soft Decreased bolus cohesion;Impaired mastication;Reduced posterior propulsion;Weak lingual manipulation;Delayed oral transit Oral - Regular -- Oral - Multi-Consistency -- Oral - Pill -- Oral Phase - Comment --  CHL IP PHARYNGEAL PHASE 04/06/2021 Pharyngeal Phase Impaired Pharyngeal- Pudding Teaspoon -- Pharyngeal -- Pharyngeal- Pudding Cup -- Pharyngeal -- Pharyngeal- Honey Teaspoon Penetration/Aspiration during swallow;Reduced airway/laryngeal closure;Delayed swallow initiation-vallecula;Reduced tongue base retraction;Pharyngeal residue - valleculae Pharyngeal Material enters airway, passes BELOW cords without attempt by patient to eject out (silent aspiration) Pharyngeal- Honey Cup Penetration/Aspiration during swallow;Reduced airway/laryngeal closure;Delayed swallow initiation-vallecula;Reduced tongue base retraction;Pharyngeal residue - valleculae;Penetration/Apiration after swallow Pharyngeal Material enters airway, passes BELOW cords without attempt by patient to eject out (silent aspiration) Pharyngeal- Nectar Teaspoon -- Pharyngeal -- Pharyngeal- Nectar Cup Penetration/Aspiration during swallow;Reduced airway/laryngeal closure;Pharyngeal residue - valleculae;Reduced tongue base retraction;Delayed swallow initiation-vallecula;Penetration/Apiration after swallow Pharyngeal Material enters airway, passes BELOW cords without attempt by patient to eject out (silent aspiration) Pharyngeal- Nectar Straw Penetration/Aspiration during swallow;Reduced airway/laryngeal closure;Delayed swallow  initiation-vallecula;Penetration/Apiration after swallow;Reduced tongue base retraction Pharyngeal Material enters airway, passes BELOW cords without attempt by patient to eject out (silent aspiration) Pharyngeal- Thin Teaspoon -- Pharyngeal -- Pharyngeal- Thin Cup Penetration/Aspiration during swallow;Pharyngeal residue - valleculae;Reduced airway/laryngeal closure;Reduced tongue base retraction Pharyngeal Material enters airway, passes BELOW cords without attempt by patient to eject out (silent aspiration) Pharyngeal- Thin Straw Penetration/Aspiration during swallow;Pharyngeal residue - valleculae;Reduced airway/laryngeal closure;Reduced tongue base retraction;Penetration/Apiration after swallow Pharyngeal Material enters airway, passes BELOW cords without attempt by patient to eject out (silent aspiration) Pharyngeal- Puree Penetration/Aspiration during swallow;Reduced airway/laryngeal closure;Penetration/Apiration after swallow Pharyngeal Material enters airway, CONTACTS cords and not  ejected out Pharyngeal- Mechanical Soft Penetration/Aspiration during swallow;Reduced airway/laryngeal closure;Penetration/Apiration after swallow Pharyngeal Material enters airway, remains ABOVE vocal cords and not ejected out Pharyngeal- Regular -- Pharyngeal -- Pharyngeal- Multi-consistency -- Pharyngeal -- Pharyngeal- Pill -- Pharyngeal -- Pharyngeal Comment --  CHL IP CERVICAL ESOPHAGEAL PHASE 04/06/2021 Cervical Esophageal Phase WFL Pudding Teaspoon -- Pudding Cup -- Honey Teaspoon -- Honey Cup -- Nectar Teaspoon -- Nectar Cup -- Nectar Straw -- Thin Teaspoon -- Thin Cup -- Thin Straw -- Puree -- Mechanical Soft -- Regular -- Multi-consistency -- Pill -- Cervical Esophageal Comment -- Note populated for Mamie Levers, Student SLP Mahala Menghini., M.A. CCC-SLP Acute Rehabilitation Services Pager [phone] Office [phone] 04/06/2021, 3:49 PM              ECHOCARDIOGRAM COMPLETE  Result Date: 04/05/2021    ECHOCARDIOGRAM  REPORT   Patient Name:   Kashae Lerette Date of Exam: 04/05/2021 Medical Rec #:  [id number]        Height:       62.0 in Accession #:    [id number]       Weight:       121.3 lb Date of Birth:  09/09/1929        BSA:          1.545 m Patient Age:    89 years         BP:           155/94 mmHg Patient Gender: F                HR:           87 bpm. Exam Location:  Inpatient Procedure: 2D Echo Indications:    atrial fibrillation  History:        Patient has prior history of Echocardiogram examinations, most                 recent 08/22/2019. CAD, chronic kidney disease, Arrythmias:Atrial                 Fibrillation, Signs/Symptoms:Dyspnea; Risk Factors:Hypertension,                 Dyslipidemia and Former Smoker.  Sonographer:    Delcie Roch Referring Phys: [phone] WHITNEY F DAVIS IMPRESSIONS  1. Left ventricular ejection fraction, by estimation, is 60 to 65%. The left ventricle has normal function. The left ventricle has no regional wall motion abnormalities. There is moderate concentric left ventricular hypertrophy. Diastolic function indeterminant due to Afib.  2. Right ventricular systolic function is normal. The right ventricular size is normal. There is moderately elevated pulmonary artery systolic pressure. The estimated right ventricular systolic pressure is 47.0 mmHg.  3. Left atrial size was massively dilated.  4. Right atrial size was severely dilated.  5. 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 There is mild bowing of the mitral valve leaflets into the LA during systole without frank prolapse.  6. Tricuspid valve regurgitation is mild to moderate.  7. The aortic valve is tricuspid. There is mild calcification of the aortic valve. There is moderate thickening of the aortic valve. Aortic valve regurgitation is trivial. Mild to moderate aortic valve sclerosis/calcification is present, without any evidence of aortic stenosis.  8. The inferior vena cava is normal in size  with greater than 50% respiratory variability, suggesting right atrial pressure of 3 mmHg. Comparison(s): The prior echo in 2020 noted a dilated aortic root and ascending aorta which are not seen on current  study however the ascending aorta is not visualized in it's entirety. Otherwise, there is no significant change. FINDINGS  Left Ventricle: Left ventricular ejection fraction, by estimation, is 60 to 65%. The left ventricle has normal function. The left ventricle has no regional wall motion abnormalities. The left ventricular internal cavity size was normal in size. There is  moderate concentric left ventricular hypertrophy. Diastolic function indeterminant due to Afib. Right Ventricle: The right ventricular size is normal. No increase in right ventricular wall thickness. Right ventricular systolic function is normal. There is moderately elevated pulmonary artery systolic pressure. The tricuspid regurgitant velocity is 3.24 m/s, and with an assumed right atrial pressure of 5 mmHg, the estimated right ventricular systolic pressure is 47.0 mmHg. Left Atrium: Left atrial size was massively dilated. Right Atrium: Right atrial size was severely dilated. Pericardium: There is no evidence of pericardial effusion. Mitral Valve: The mitral valve is myxomatous. There is mild thickening of the mitral valve leaflet(s). There is mild calcification of the mitral valve leaflet(s). Mild to moderate mitral annular calcification. Mild to moderate mitral valve regurgitation. Tricuspid Valve: The tricuspid valve is normal in structure. Tricuspid valve regurgitation is mild to moderate. Aortic Valve: The aortic valve is tricuspid. There is mild calcification of the aortic valve. There is moderate thickening of the aortic valve. Aortic valve regurgitation is trivial. Mild to moderate aortic valve sclerosis/calcification is present, without any evidence of aortic stenosis. Pulmonic Valve: The pulmonic valve was normal in structure.  Pulmonic valve regurgitation is mild. Aorta: The aortic root and ascending aorta are structurally normal, with no evidence of dilitation. Venous: The inferior vena cava is normal in size with greater than 50% respiratory variability, suggesting right atrial pressure of 3 mmHg. IAS/Shunts: No atrial level shunt detected by color flow Doppler.  LEFT VENTRICLE PLAX 2D LVIDd:         3.60 cm LVIDs:         2.00 cm LV PW:         1.30 cm LV IVS:        1.40 cm LVOT diam:     1.80 cm LVOT Area:     2.54 cm  RIGHT VENTRICLE             IVC RV S prime:     10.80 cm/s  IVC diam: 1.90 cm TAPSE (M-mode): 1.6 cm LEFT ATRIUM              Index       RIGHT ATRIUM           Index LA diam:        5.30 cm  3.43 cm/m  RA Area:     29.00 cm LA Vol (A2C):   104.0 ml 67.30 ml/m RA Volume:   87.50 ml  56.62 ml/m LA Vol (A4C):   147.0 ml 95.12 ml/m LA Biplane Vol: 133.0 ml 86.06 ml/m   AORTA Ao Root diam: 2.60 cm Ao Asc diam:  2.90 cm MR Peak grad: 125.9 mmHg  TRICUSPID VALVE MR Mean grad: 72.0 mmHg   TV Peak grad:   33.2 mmHg MR Vmax:      561.00 cm/s TV Vmax:        2.88 m/s MR Vmean:     384.0 cm/s  TR Peak grad:   42.0 mmHg                           TR Vmax:  324.00 cm/s                            SHUNTS                           Systemic Diam: 1.80 cm Laurance Flatten MD Electronically signed by Laurance Flatten MD Signature Date/Time: 04/05/2021/6:57:29 PM    Final      Time coordinating discharge: Over 30 minutes    Lewie Chamber, MD  [facility] 04/11/2021, 12:31 PM

## 2021-04-11 NOTE — Progress Notes (Signed)
Civil engineer, contracting [facility])   Received request from [facility] for hospice services at [facility] ALF after discharge. Patient has been approved for hospice services at discharge by [facility] MD.  This Liaison spoke with [name] to confirm and initiate hospice services. Plan is to D/C today by [facility] to [facility]. Bed has been ordered and is set to deliver today by [employer]. [facility] team is aware and will be in touch with Carla.  Please feel free to call with any questions and concerns.  Yolande Jolly, BSN, RN [phone]

## 2021-04-11 NOTE — Evaluation (Signed)
Occupational Therapy Evaluation Patient Details Name: Christina Ortiz MRN: [id number] DOB: 02/20/1929 Today's Date: 04/11/2021    History of Present Illness 87 yo admitted 4/8 with AMS from ALF with SOB and aspiration with HTN urgency. PMHx: PAF, CKD, HTN, HLD, hypothyroidism, GERD, dementia, Rt humerus fx 2019.   Clinical Impression   Patient admitted with the above diagnosis.  PTA she lived in an ALF, needed assist from staff for basic mobility and ADL care.  Patient did not need to participate with home management and meals.  Meds were managed by staff.  Barriers are listed below.  Currently she is needing up to Mod A for ADL and Min A for basic transfers.  Of note: family is discussing ALF with hospice services.  OT will follow her 1x/wk and wait for determination of goals of care to proceed.      Follow Up Recommendations  Supervision/Assistance - 24 hour;Home health OT    Equipment Recommendations       Recommendations for Other Services       Precautions / Restrictions Precautions Precautions: Fall Restrictions Weight Bearing Restrictions: No      Mobility Bed Mobility Overal bed mobility: Needs Assistance Bed Mobility: Supine to Sit     Supine to sit: Supervision;HOB elevated       Patient Response: Cooperative  Transfers Overall transfer level: Needs assistance Equipment used: Rolling walker (2 wheeled) Transfers: Sit to/from [employer] Sit to Stand: Min guard Stand pivot transfers: Min assist            Balance Overall balance assessment: Needs assistance Sitting-balance support: No upper extremity supported;Feet supported Sitting balance-Leahy Scale: Fair     Standing balance support: Bilateral upper extremity supported Standing balance-Leahy Scale: Poor                             ADL either performed or assessed with clinical judgement   ADL Overall ADL's : Needs assistance/impaired Eating/Feeding: Set up;Bed  level   Grooming: Wash/dry hands;Wash/dry face;Set up;Sitting   Upper Body Bathing: Supervision/ safety;Sitting   Lower Body Bathing: Moderate assistance;Sitting/lateral leans   Upper Body Dressing : Minimal assistance;Sitting   Lower Body Dressing: Moderate assistance;Sitting/lateral leans               Functional mobility during ADLs: Minimal assistance;Rolling walker       Vision Patient Visual Report: No change from baseline                  Pertinent Vitals/Pain Faces Pain Scale: No hurt Pain Intervention(s): Monitored during session     Hand Dominance Right   Extremity/Trunk Assessment Upper Extremity Assessment Upper Extremity Assessment: Generalized weakness   Lower Extremity Assessment Lower Extremity Assessment: Generalized weakness   Cervical / Trunk Assessment Cervical / Trunk Assessment: Kyphotic   Communication Communication Communication: HOH   Cognition Arousal/Alertness: Awake/alert Behavior During Therapy: WFL for tasks assessed/performed Overall Cognitive Status: No family/caregiver present to determine baseline cognitive functioning Area of Impairment: Memory;Orientation;Attention;Safety/judgement                 Orientation Level: Disoriented to;Time;Place;Situation Current Attention Level: Sustained Memory: Decreased short-term memory   Safety/Judgement: Decreased awareness of safety;Decreased awareness of deficits     General Comments: pt incontinent of bladder on arrival and unaware.                    Home Living Family/patient expects to be  discharged to:: Assisted living                             Home Equipment: Krystal Clark - 4 wheels;Bedside commode;Shower seat;Wheelchair - manual;Cane - single point          Prior Functioning/Environment Level of Independence: Needs assistance  Gait / Transfers Assistance Needed: pt reports limited walking and using a WC. Daughter states she is  unsure how much mobility is being performed ADL's / Homemaking Assistance Needed: assist of staff for all bathing/dressing. Staff performs all homemaking   Comments: pt unable to provide and family not currently present        OT Problem List: Decreased strength;Decreased activity tolerance;Impaired balance (sitting and/or standing)      OT Treatment/Interventions: Self-care/ADL training;Therapeutic exercise;Therapeutic activities;Balance training;Cognitive remediation/compensation    OT Goals(Current goals can be found in the care plan section) Acute Rehab OT Goals Patient Stated Goal: Patient talks about going home OT Goal Formulation: With patient Time For Goal Achievement: 04/25/21 Potential to Achieve Goals: Good ADL Goals Pt Will Perform Grooming: with set-up;sitting Pt Will Perform Upper Body Bathing: with set-up;sitting Pt Will Perform Lower Body Bathing: sit to/from stand;with min guard assist Pt Will Perform Upper Body Dressing: with set-up;sitting Pt Will Perform Lower Body Dressing: with min guard assist;sit to/from stand Pt Will Transfer to Toilet: with supervision;ambulating;regular height toilet Pt Will Perform Toileting - Clothing Manipulation and hygiene: with min guard assist;sit to/from stand  OT Frequency: Min 1X/week   Barriers to D/C:    none noted       Co-evaluation              AM-PAC OT "6 Clicks" Daily Activity     Outcome Measure Help from another person eating meals?: None Help from another person taking care of personal grooming?: None Help from another person toileting, which includes using toliet, bedpan, or urinal?: A Lot Help from another person bathing (including washing, rinsing, drying)?: A Lot Help from another person to put on and taking off regular upper body clothing?: A Little Help from another person to put on and taking off regular lower body clothing?: A Lot 6 Click Score: 17   End of Session Equipment Utilized During  Treatment: Rolling walker Nurse Communication: Mobility status  Activity Tolerance: Patient tolerated treatment well Patient left: in chair;with call bell/phone within reach;with chair alarm set  OT Visit Diagnosis: Unsteadiness on feet (R26.81);Muscle weakness (generalized) (M62.81)                Time: 2536-6440 OT Time Calculation (min): 24 min Charges:  OT General Charges $OT Visit: 1 Visit OT Evaluation $OT Eval Moderate Complexity: 1 Mod OT Treatments $Self Care/Home Management : 8-22 mins  04/11/2021  Rich, OTR/L  Acute Rehabilitation Services  Office:  [phone]   Suzanna Obey 04/11/2021, 9:35 AM

## 2021-04-11 NOTE — TOC Progression Note (Signed)
Transition of Care [facility]) - Progression Note    Patient Details  Name: Christina Ortiz MRN: [id number] Date of Birth: Apr 30, 1929  Transition of [facility]) CM/SW Contact  Patrice Paradise, LCSW Phone Number: [phone] 04/11/2021, 3:39 PM  Clinical Narrative:     CSW was just alerted that [employer] is unable to redeliver the hospital bed today and it will be Monday before it can be delivered. CSW updated daughter and spoke with [place] at facility.  Patient can be discharged Monday after bed is delivered. Please follow up with Keisha at [phone] to coordinate discharge. Deanna Artis has been provided with all documents that have been requested.  TOC team will continue to assist with discharge planning needs.    Expected Discharge Plan: Assisted Living Barriers to Discharge: Continued Medical Work up  Expected Discharge Plan and Services Expected Discharge Plan: Assisted Living In-house Referral: Clinical Social Work       Expected Discharge Date: 04/11/21                                     Social Determinants of Health (SDOH) Interventions    Readmission Risk Interventions No flowsheet data found.

## 2021-04-12 DIAGNOSIS — G9341 Metabolic encephalopathy: Secondary | ICD-10-CM | POA: Diagnosis not present

## 2021-04-12 DIAGNOSIS — R1310 Dysphagia, unspecified: Secondary | ICD-10-CM | POA: Diagnosis not present

## 2021-04-12 DIAGNOSIS — I480 Paroxysmal atrial fibrillation: Secondary | ICD-10-CM | POA: Diagnosis not present

## 2021-04-12 LAB — GLUCOSE, CAPILLARY
Glucose-Capillary: 93 mg/dL (ref 70–99)
Glucose-Capillary: 75 mg/dL (ref 70–99)
Glucose-Capillary: 106 mg/dL — ABNORMAL HIGH (ref 70–99)

## 2021-04-12 NOTE — Progress Notes (Signed)
Mobility Specialist: Progress Note   04/12/21 1352  Mobility  Activity Ambulated in room  Level of Assistance Minimal assist, patient does 75% or more  Assistive Device Front wheel walker  Distance Ambulated (ft) 100 ft  Mobility Response Tolerated well  Mobility performed by Mobility specialist  Bed Position Chair  $Mobility charge 1 Mobility   Pre-Mobility: 80 HR, 100% SpO2 Post-Mobility: 102 HR, 149/76 BP, 99% SpO2  Pt asx during ambulation. Pt more steady during today's walk than yesterday. Pt to chair after walk with chair alarm on.   [facility] Rocco Kerkhoff Mobility Specialist Mobility Specialist Phone: [phone]

## 2021-04-12 NOTE — Progress Notes (Signed)
Mobility Specialist: Progress Note   04/12/21 1554  Mobility  Activity Transferred:  Chair to bed  Level of Assistance Minimal assist, patient does 75% or more  Assistive Device Front wheel walker  Mobility Response Tolerated well  Mobility performed by Mobility specialist  $Mobility charge 1 Mobility   Transferred pt back to bed per RN request. Pt minA to stand and pivot transfer. Bed alarm is on.   [facility] Christina Ortiz Mobility Specialist Mobility Specialist Phone: [phone]

## 2021-04-12 NOTE — Progress Notes (Signed)
Progress Note    Christina Ortiz   BJS:[id number]  DOB: 1929-08-08  DOA: 04/03/2021     9  PCP: No primary care provider on file.  CC: SOB  Hospital Course: Patient is a 87 year old female with history of paroxysmal A. fib on Eliquis, CKD, hypertension, hyperlipidemia, hypothyroidism, GERD, mild dementia presented from local ALF with altered mental status, shortness of breath and severe hypertension.  Patient's daughter had reported altered mental status with an episode of vomiting followed by hypoxia later severe hypertension. In ED, patient was hypertensive with BP 203/137, O2 sats appropriate for 4 L [place].  Patient was placed on nitro drip for hypertensive urgency.  However due to worsening mental status, hypoxic and hypotension, nitro drip was discontinued, placed BiPAP and patient was admitted to ICU for further work-up.  Interval History:  No events overnight.  Still feeling good today and resting in bed when seen.  She is alert/awake and had eaten breakfast.  She is ready for discharge from the hospital once her bed arrives and hoping to leave tomorrow.  ROS: Constitutional: negative for chills and fevers, Respiratory: negative for cough, Cardiovascular: negative for chest pain and Gastrointestinal: negative for abdominal pain  Assessment & Plan: Acute respiratory failure with hypoxia - Multifactorial, precipitated by flash pulmonary edema, aspiration event, with underlying history of diastolic CHF, hypertensive urgency -Off the BiPAP, chest x-ray showed persistent cardiomegaly with left base atelectasis.  - O2 sats 96- 99% on 2 L O2 via [place], wean as tolerated.  Now on room air -2D echo 4/10 showed EF 60-65%, diastolic function indeterminate.  Mild to moderate mitral valve regurgitation -DC Lasix. -Aspiration precautions  Hypokalemia, hypomagnesemia -Aggressively replaced IV  Hypoglycemia -Has been on D10 infusion.  Questionable reliability with CBG checks however glucose has  been low on BMP at times.  Diet has now been resumed (dysphagia) -Discontinue D10 infusion, continue CBG checks for now.  If remains stable, can further back off -Glucose levels remaining stable on 04/10/2021 -Continue dysphagia diet  Acute metabolic encephalopathy -resolved -Multifactorial in the setting of hypertensive urgency, delirium, infection -Currently alert and oriented, improving -Delirium and aspiration precautions  Dysphagia likely due to aspiration event -Family discussion held with palliative care.  Plan is for resuming dysphagia diet and continuing aspiration precautions; family understands the risk -Continue aspiration precautions -Plan was discharge back to [facility]F with hospice in place however as of 4/15 she had declined some; better on 4/16. Okay for [facility] but waiting on hospital bed delivery (expected Monday); will watch her clinically until then daily   Hypothyroidism -TSH 4.7, free T4 1.1 -Patient on Synthroid 75 MCG daily outpatient - resume oral synthroid  History of paroxysmal atrial fibrillation -Rate controlled - d/c heparin drip now that taking PO again - resume xarelto    Old records reviewed in assessment of this patient   DVT prophylaxis: SCDs Start: 04/03/21 1723 Rivaroxaban (XARELTO) tablet 15 mg Heparin drip: 04/08/21 > 4/14. Resume xarelto 4/14  Code Status:   Code Status: DNR Family Communication:   Disposition Plan: Status is: Inpatient  Remains inpatient appropriate because:Inpatient level of care appropriate due to severity of illness   Dispo: The patient is from: Home              Anticipated d/c is to: [facility] with hospice              Patient currently is medically stable to d/c.  Plan for Monday   Difficult to place patient  No  Risk of unplanned readmission score: Unplanned Admission- Pilot do not use: 19.7   Objective: Blood pressure 127/89, pulse 83, temperature 98.5 F (36.9 C), temperature source  Axillary, resp. rate 18, height 5\' 2"  (1.575 m), weight 56.5 kg, SpO2 100 %.  Examination: General appearance: more awake/alert today compared to yesterday Head: Normocephalic, without obvious abnormality, atraumatic Eyes: EOMI Lungs: clear to auscultation bilaterally Heart: irregularly irregular rhythm and S1, S2 normal Abdomen: normal findings: bowel sounds normal and soft, non-tender Extremities: edema noted in UE's Skin: mobility and turgor normal Neurologic: Grossly normal  Consultants:   PCM  Procedures:     Data Reviewed: I have personally reviewed following labs and imaging studies Results for orders placed or performed during the hospital encounter of 04/03/21 (from the past 24 hour(s))  Glucose, capillary     Status: Abnormal   Collection Time: 04/11/21  4:10 PM  Result Value Ref Range   Glucose-Capillary 62 (L) 70 - 99 mg/dL  Glucose, capillary     Status: None   Collection Time: 04/11/21  5:16 PM  Result Value Ref Range   Glucose-Capillary 76 70 - 99 mg/dL  Glucose, capillary     Status: None   Collection Time: 04/12/21 11:01 AM  Result Value Ref Range   Glucose-Capillary 93 70 - 99 mg/dL    Recent Results (from the past 240 hour(s))  Resp Panel by RT-PCR (Flu A&B, Covid) Nasopharyngeal Swab     Status: None   Collection Time: 04/03/21  1:54 PM   Specimen: Nasopharyngeal Swab; Nasopharyngeal(NP) swabs in vial transport medium  Result Value Ref Range Status   SARS Coronavirus 2 by RT PCR NEGATIVE NEGATIVE Final    Comment: (NOTE) SARS-CoV-2 target nucleic acids are NOT DETECTED.  The SARS-CoV-2 RNA is generally detectable in upper respiratory specimens during the acute phase of infection. The lowest concentration of SARS-CoV-2 viral copies this assay can detect is 138 copies/mL. A negative result does not preclude SARS-Cov-2 infection and should not be used as the sole basis for treatment or other patient management decisions. A negative result may occur  with  improper specimen collection/handling, submission of specimen other than nasopharyngeal swab, presence of viral mutation(s) within the areas targeted by this assay, and inadequate number of viral copies(<138 copies/mL). A negative result must be combined with clinical observations, patient history, and epidemiological information. The expected result is Negative.  Fact Sheet for Patients:  06/03/21  Fact Sheet for Healthcare Providers:  BloggerCourse.com  This test is no t yet approved or cleared by the [place] FDA and  has been authorized for detection and/or diagnosis of SARS-CoV-2 by FDA under an Emergency Use Authorization (EUA). This EUA will remain  in effect (meaning this test can be used) for the duration of the COVID-19 declaration under Section 564(b)(1) of the Act, 21 U.S.C.section 360bbb-3(b)(1), unless the authorization is terminated  or revoked sooner.       Influenza A by PCR NEGATIVE NEGATIVE Final   Influenza B by PCR NEGATIVE NEGATIVE Final    Comment: (NOTE) The Xpert Xpress SARS-CoV-2/FLU/RSV plus assay is intended as an aid in the diagnosis of influenza from Nasopharyngeal swab specimens and should not be used as a sole basis for treatment. Nasal washings and aspirates are unacceptable for Xpert Xpress SARS-CoV-2/FLU/RSV testing.  Fact Sheet for Patients: [place]  Fact Sheet for Healthcare Providers: BloggerCourse.com  This test is not yet approved or cleared by the [place] FDA and has been authorized for detection  and/or diagnosis of SARS-CoV-2 by FDA under an Emergency Use Authorization (EUA). This EUA will remain in effect (meaning this test can be used) for the duration of the COVID-19 declaration under Section 564(b)(1) of the Act, 21 U.S.C. section 360bbb-3(b)(1), unless the authorization is terminated  or revoked.  Performed at [facility], [address] [place], [place] [address]   MRSA PCR Screening     Status: Abnormal   Collection Time: 04/03/21  8:12 PM   Specimen: Nasal Mucosa; Nasopharyngeal  Result Value Ref Range Status   MRSA by PCR POSITIVE (A) NEGATIVE Final    Comment:        The GeneXpert MRSA Assay (FDA approved for NASAL specimens only), is one component of a comprehensive MRSA colonization surveillance program. It is not intended to diagnose MRSA infection nor to guide or monitor treatment for MRSA infections. RESULT CALLED TO, READ BACK BY AND VERIFIED WITHChalmers Guest RN [phone] 04/03/21 A BROWNING Performed at [facility], [address], [place], [place] [address]   Culture, blood (routine x 2)     Status: None   Collection Time: 04/03/21  8:14 PM   Specimen: BLOOD RIGHT ARM  Result Value Ref Range Status   Specimen Description BLOOD RIGHT ARM  Final   Special Requests   Final    BOTTLES DRAWN AEROBIC ONLY Blood Culture results may not be optimal due to an inadequate volume of blood received in culture bottles   Culture   Final    NO GROWTH 5 DAYS Performed at [facility], [address], [place], [place] [address]    Report Status 04/08/2021 FINAL  Final  Culture, blood (routine x 2)     Status: None   Collection Time: 04/03/21  8:25 PM   Specimen: BLOOD LEFT ARM  Result Value Ref Range Status   Specimen Description BLOOD LEFT ARM  Final   Special Requests   Final    BOTTLES DRAWN AEROBIC AND ANAEROBIC Blood Culture results may not be optimal due to an inadequate volume of blood received in culture bottles   Culture   Final    NO GROWTH 5 DAYS Performed at [facility], [address] [place], [place] [address]    Report Status 04/08/2021 FINAL  Final  Urine Culture     Status: None   Collection Time: 04/03/21  8:50 PM   Specimen: Urine, Random  Result Value Ref Range Status   Specimen Description URINE, RANDOM  Final    Special Requests NONE  Final   Culture   Final    NO GROWTH Performed at [facility], [address], [place], [place] [address]    Report Status 04/05/2021 FINAL  Final  Resp Panel by RT-PCR (Flu A&B, Covid) Nasopharyngeal Swab     Status: None   Collection Time: 04/11/21 10:00 AM   Specimen: Nasopharyngeal Swab; Nasopharyngeal(NP) swabs in vial transport medium  Result Value Ref Range Status   SARS Coronavirus 2 by RT PCR NEGATIVE NEGATIVE Final    Comment: (NOTE) SARS-CoV-2 target nucleic acids are NOT DETECTED.  The SARS-CoV-2 RNA is generally detectable in upper respiratory specimens during the acute phase of infection. The lowest concentration of SARS-CoV-2 viral copies this assay can detect is 138 copies/mL. A negative result does not preclude SARS-Cov-2 infection and should not be used as the sole basis for treatment or other patient management decisions. A negative result may occur with  improper specimen collection/handling, submission of specimen other than nasopharyngeal swab, presence of viral mutation(s) within the areas targeted by this assay, and inadequate number of viral copies(<138 copies/mL). A negative result must be combined with clinical observations, patient history, and epidemiological information. The expected result is Negative.  Fact Sheet for Patients:  BloggerCourse.com  Fact Sheet for Healthcare Providers:  SeriousBroker.it  This test is no t yet approved or cleared by the [place] FDA and  has been authorized for detection and/or diagnosis of SARS-CoV-2 by FDA under an Emergency Use Authorization (EUA). This EUA will remain  in effect (meaning this test can be used) for the duration of the COVID-19 declaration under Section 564(b)(1) of the Act, 21 U.S.C.section 360bbb-3(b)(1), unless the authorization is terminated  or revoked sooner.       Influenza A by PCR NEGATIVE NEGATIVE  Final   Influenza B by PCR NEGATIVE NEGATIVE Final    Comment: (NOTE) The Xpert Xpress SARS-CoV-2/FLU/RSV plus assay is intended as an aid in the diagnosis of influenza from Nasopharyngeal swab specimens and should not be used as a sole basis for treatment. Nasal washings and aspirates are unacceptable for Xpert Xpress SARS-CoV-2/FLU/RSV testing.  Fact Sheet for Patients: BloggerCourse.com  Fact Sheet for Healthcare Providers: SeriousBroker.it  This test is not yet approved or cleared by the [place] FDA and has been authorized for detection and/or diagnosis of SARS-CoV-2 by FDA under an Emergency Use Authorization (EUA). This EUA will remain in effect (meaning this test can be used) for the duration of the COVID-19 declaration under Section 564(b)(1) of the Act, 21 U.S.C. section 360bbb-3(b)(1), unless the authorization is terminated or revoked.  Performed at [facility], [address] [place], [place] [address]      Radiology Studies: No results found. DG Swallowing Func-Speech Pathology  Final Result    DG CHEST PORT 1 VIEW  Final Result    CT Head Wo Contrast  Final Result    DG Chest Port 1 View  Final Result      Scheduled Meds: . chlorhexidine  15 mL Mouth Rinse BID  . Chlorhexidine Gluconate Cloth  6 each Topical Daily  . docusate  100 mg Per Tube BID  . levothyroxine  75 mcg Oral Q0600  . mouth rinse  15 mL Mouth Rinse q12n4p  . polyethylene glycol  17 g Per Tube Daily  . Rivaroxaban  15 mg Oral Q supper   PRN Meds: docusate sodium, haloperidol lactate, hydrALAZINE, ondansetron (ZOFRAN) IV, polyethylene glycol, Resource ThickenUp Clear Continuous Infusions:    LOS: 9 days  Time spent: Greater than 50% of the 35 minute visit was spent in counseling/coordination of care for the patient as laid out in the A&P.   Lewie Chamber, MD [facility] 04/12/2021, 11:39 AM

## 2021-04-13 DIAGNOSIS — R1310 Dysphagia, unspecified: Secondary | ICD-10-CM | POA: Diagnosis not present

## 2021-04-13 DIAGNOSIS — G9341 Metabolic encephalopathy: Secondary | ICD-10-CM | POA: Diagnosis not present

## 2021-04-13 LAB — GLUCOSE, CAPILLARY
Glucose-Capillary: 63 mg/dL — ABNORMAL LOW (ref 70–99)
Glucose-Capillary: 97 mg/dL (ref 70–99)
Glucose-Capillary: 88 mg/dL (ref 70–99)
Glucose-Capillary: 93 mg/dL (ref 70–99)

## 2021-04-13 NOTE — Progress Notes (Signed)
  Speech Language Pathology Treatment: Dysphagia  Patient Details Name: Christina Ortiz MRN: [id number] DOB: 10-18-1929 Today's Date: 04/13/2021 Time: 4235-3614 SLP Time Calculation (min) (ACUTE ONLY): 17 min  Assessment / Plan / Recommendation Clinical Impression  Pt is alert and more conversant this morning, but still pleasantly confused. She had just eaten 100% of her lunch meal, but says that she hates the pureed foods. SLP provided advanced trials of solids that were softened and broken into small, single bites. Min-Mod cues were provided throughout session for sustained attention to mastication and clearance of lingual residue, but pt does appear to be masticating her food more so than she was on her initial MBS. One delayed cough and an instance of wet vocal quality were noted, but SLP provided cues throughout intake for volitional coughs as well to reduce aspiration risk as much as possible. Family at this time is accepting of aspiration risk though. Recommend advancing to Dys 2 (finely chopped) foods to provide potentially more palatable options in light of improved mastication, while still trying to reduce her risk as much as possible while still considering oral and cognitive difficulties as well.   HPI HPI: Pt is an 87 y.o. female with acute hypoxemic respiratory failure secondary to potential aspiration episode with vomiting. Also presents with sepsis secondary to presumed UTI. CXR 4/11: Worsening patchy airspace disease in the left mid lung and right lower lobe. Esophagram (2019): Significant distal esophageal stricture above a moderate-sized hiatal hernia; Esophageal dysmotility. PMH: GERD, HTN, hiatal hernia, gastritis      SLP Plan  Continue with current plan of care       Recommendations  Diet recommendations: Dysphagia 2 (fine chop);Thin liquid Liquids provided via: Cup;Straw Medication Administration: Crushed with puree Supervision: Staff to assist with self feeding;Full  supervision/cueing for compensatory strategies Compensations: Slow rate;Small sips/bites;Minimize environmental distractions;Hard cough after swallow Postural Changes and/or Swallow Maneuvers: Seated upright 90 degrees                Oral Care Recommendations: Oral care QID Follow up Recommendations: Other (comment) (return to ALF with hospice services per palliative care notes) SLP Visit Diagnosis: Dysphagia, oropharyngeal phase (R13.12) Plan: Continue with current plan of care       GO                 Mahala Menghini., M.A. CCC-SLP Acute Rehabilitation Services Pager [phone] Office [phone]  04/13/2021, 2:51 PM

## 2021-04-13 NOTE — Progress Notes (Signed)
Attempted report to [facility]F. Nurse not available.

## 2021-04-13 NOTE — TOC Transition Note (Signed)
Transition of Care [facility]) - CM/SW Discharge Note   Patient Details  Name: Christina Ortiz MRN: [id number] Date of Birth: [birth year]  Transition of Care [facility] CM/SW Contact:  Carley Hammed, LCSWA Phone Number: 04/13/2021, 11:17 AM   Clinical Narrative:     Pt to be transported to [facility] by PT[facility].  Nurse to call report to [phone] Please contact dtr, [name] [phone]) when pt is picked up. At this time waiting for Hospital bed to be delivered.   Final next level of care: Assisted Living Barriers to Discharge: Equipment Delay   Patient Goals and CMS Choice        Discharge Placement              Patient chooses bed at:  ([facility]) Patient to be transferred to facility by: [facility] Name of family member notified: Carla Patient and family notified of of transfer: 04/13/21  Discharge Plan and Services In-house Referral: Clinical Social Work                                   Social Determinants of Health (SDOH) Interventions     Readmission Risk Interventions No flowsheet data found.

## 2021-04-13 NOTE — Progress Notes (Signed)
[facility] here to pick up pt. Attempted to call report to [facility] RN. Awaiting callback. All belongings sent with patient. Tele and IV discontinued. Daughter notified of patients discharge.

## 2021-04-16 DIAGNOSIS — F1020 Alcohol dependence, uncomplicated: Secondary | ICD-10-CM | POA: Diagnosis not present

## 2021-04-16 DIAGNOSIS — Z8781 Personal history of (healed) traumatic fracture: Secondary | ICD-10-CM | POA: Diagnosis not present

## 2021-04-16 DIAGNOSIS — J690 Pneumonitis due to inhalation of food and vomit: Secondary | ICD-10-CM | POA: Diagnosis not present

## 2021-04-16 DIAGNOSIS — R3989 Other symptoms and signs involving the genitourinary system: Secondary | ICD-10-CM | POA: Diagnosis not present

## 2021-04-16 DIAGNOSIS — G478 Other sleep disorders: Secondary | ICD-10-CM | POA: Diagnosis not present

## 2021-04-16 DIAGNOSIS — I10 Essential (primary) hypertension: Secondary | ICD-10-CM | POA: Diagnosis not present

## 2021-05-27 DEATH — deceased
